# Patient Record
Sex: Male | Born: 1950 | Race: White | Hispanic: No | Marital: Married | Smoking: Former smoker
Health system: Southern US, Community
[De-identification: ages and names within clinical notes are randomized; demographics above are authoritative.]

## PROBLEM LIST (undated history)

## (undated) DIAGNOSIS — D6859 Other primary thrombophilia: Secondary | ICD-10-CM

## (undated) DIAGNOSIS — S129XXA Fracture of neck, unspecified, initial encounter: Secondary | ICD-10-CM

## (undated) DIAGNOSIS — M4802 Spinal stenosis, cervical region: Secondary | ICD-10-CM

## (undated) DIAGNOSIS — I639 Cerebral infarction, unspecified: Secondary | ICD-10-CM

## (undated) DIAGNOSIS — E785 Hyperlipidemia, unspecified: Secondary | ICD-10-CM

## (undated) DIAGNOSIS — D509 Iron deficiency anemia, unspecified: Secondary | ICD-10-CM

## (undated) DIAGNOSIS — I10 Essential (primary) hypertension: Secondary | ICD-10-CM

## (undated) DIAGNOSIS — Z86010 Personal history of colonic polyps: Secondary | ICD-10-CM

## (undated) DIAGNOSIS — G4733 Obstructive sleep apnea (adult) (pediatric): Secondary | ICD-10-CM

## (undated) DIAGNOSIS — K5790 Diverticulosis of intestine, part unspecified, without perforation or abscess without bleeding: Secondary | ICD-10-CM

## (undated) DIAGNOSIS — H9193 Unspecified hearing loss, bilateral: Secondary | ICD-10-CM

## (undated) DIAGNOSIS — C61 Malignant neoplasm of prostate: Secondary | ICD-10-CM

## (undated) HISTORY — PX: TONSILLECTOMY: SUR1361

## (undated) HISTORY — PX: POLYPECTOMY: SHX149

## (undated) HISTORY — PX: COLONOSCOPY: SHX174

## (undated) HISTORY — DX: Hyperlipidemia, unspecified: E78.5

## (undated) HISTORY — PX: NO PAST SURGERIES: SHX2092

## (undated) HISTORY — DX: Obstructive sleep apnea (adult) (pediatric): G47.33

## (undated) HISTORY — DX: Unspecified hearing loss, bilateral: H91.93

## (undated) HISTORY — DX: Essential (primary) hypertension: I10

## (undated) HISTORY — DX: Fracture of neck, unspecified, initial encounter: S12.9XXA

## (undated) HISTORY — DX: Personal history of colonic polyps: Z86.010

---

## 1999-06-07 ENCOUNTER — Encounter: Admission: RE | Admit: 1999-06-07 | Discharge: 1999-09-05 | Payer: Self-pay | Admitting: Cardiology

## 2010-02-12 ENCOUNTER — Encounter (INDEPENDENT_AMBULATORY_CARE_PROVIDER_SITE_OTHER): Payer: Self-pay | Admitting: *Deleted

## 2010-03-19 ENCOUNTER — Encounter (INDEPENDENT_AMBULATORY_CARE_PROVIDER_SITE_OTHER): Payer: Self-pay | Admitting: *Deleted

## 2010-03-25 ENCOUNTER — Ambulatory Visit: Payer: Self-pay | Admitting: Internal Medicine

## 2010-03-25 ENCOUNTER — Encounter (INDEPENDENT_AMBULATORY_CARE_PROVIDER_SITE_OTHER): Payer: Self-pay | Admitting: *Deleted

## 2010-04-03 ENCOUNTER — Ambulatory Visit: Payer: Self-pay | Admitting: Internal Medicine

## 2010-04-03 DIAGNOSIS — Z860100 Personal history of colon polyps, unspecified: Secondary | ICD-10-CM

## 2010-04-03 HISTORY — PX: COLONOSCOPY W/ POLYPECTOMY: SHX1380

## 2010-04-03 HISTORY — DX: Personal history of colon polyps, unspecified: Z86.0100

## 2010-04-03 HISTORY — DX: Personal history of colonic polyps: Z86.010

## 2010-04-11 ENCOUNTER — Encounter: Payer: Self-pay | Admitting: Internal Medicine

## 2010-06-11 NOTE — Letter (Signed)
Summary: [facility] Instructions  [facility]  [address] [place], [place] [address]   Phone: [phone]  Fax: [phone]       Kenneth [name]    1951-05-09    MRN: [id number]        Procedure Day Dorna Bloom:  Wednesday 04/03/2010     Arrival Time:  7:30 am      Procedure Time:  8:30 am     Location of Procedure:                    _x _  [facility] (4th Floor)                        PREPARATION FOR COLONOSCOPY WITH MOVIPREP   Starting 5 days prior to your procedure Friday 11/18 do not eat nuts, seeds, popcorn, corn, beans, peas,  salads, or any raw vegetables.  Do not take any fiber supplements (e.g. Metamucil, Citrucel, and Benefiber).  THE DAY BEFORE YOUR PROCEDURE         DATE: Tuesday 11/22  1.  Drink clear liquids the entire day-NO SOLID FOOD  2.  Do not drink anything colored red or purple.  Avoid juices with pulp.  No orange juice.  3.  Drink at least 64 oz. (8 glasses) of fluid/clear liquids during the day to prevent dehydration and help the prep work efficiently.  CLEAR LIQUIDS INCLUDE: Water Jello Ice Popsicles Tea (sugar ok, no milk/cream) Powdered fruit flavored drinks Coffee (sugar ok, no milk/cream) Gatorade Juice: apple, white grape, white cranberry  Lemonade Clear bullion, consomm, broth Carbonated beverages (any kind) Strained chicken noodle soup Hard Candy                             4.  In the morning, mix first dose of MoviPrep solution:    Empty 1 Pouch A and 1 Pouch B into the disposable container    Add lukewarm drinking water to the top line of the container. Mix to dissolve    Refrigerate (mixed solution should be used within 24 hrs)  5.  Begin drinking the prep at 5:00 p.m. The MoviPrep container is divided by 4 marks.   Every 15 minutes drink the solution down to the next Baylon (approximately 8 oz) until the full liter is complete.   6.  Follow completed prep with 16 oz of clear liquid of your choice  (Nothing red or purple).  Continue to drink clear liquids until bedtime.  7.  Before going to bed, mix second dose of MoviPrep solution:    Empty 1 Pouch A and 1 Pouch B into the disposable container    Add lukewarm drinking water to the top line of the container. Mix to dissolve    Refrigerate  THE DAY OF YOUR PROCEDURE      DATE: Wednesday 11/23  Beginning at 3:30 a.m. (5 hours before procedure):         1. Every 15 minutes, drink the solution down to the next Darran (approx 8 oz) until the full liter is complete.  2. Follow completed prep with 16 oz. of clear liquid of your choice.    3. You may drink clear liquids until 6:30 am (2 HOURS BEFORE PROCEDURE).   MEDICATION INSTRUCTIONS  Unless otherwise instructed, you should take regular prescription medications with a small sip of water   as early as possible the  morning of your procedure.  Diabetic patients - see separate instructions.  Additional medication instructions: Do not take Benazepril/HCTZ day of procedure.         OTHER INSTRUCTIONS  You will need a responsible adult at least 62 years of age to accompany you and drive you home.   This person must remain in the waiting room during your procedure.  Wear loose fitting clothing that is easily removed.  Leave jewelry and other valuables at home.  However, you may wish to bring a book to read or  an iPod/MP3 player to listen to music as you wait for your procedure to start.  Remove all body piercing jewelry and leave at home.  Total time from sign-in until discharge is approximately 2-3 hours.  You should go home directly after your procedure and rest.  You can resume normal activities the  day after your procedure.  The day of your procedure you should not:   Drive   Make legal decisions   Operate machinery   Drink alcohol   Return to work  You will receive specific instructions about eating, activities and medications before you leave.    The  above instructions have been reviewed and explained to me by   _______________________    I fully understand and can verbalize these instructions _____________________________ Date _________

## 2010-06-11 NOTE — Letter (Signed)
Summary: Patient Notice- Polyp Results  [facility]  [address] [place], [place] [address]   Phone: [phone]  Fax: [phone]        April 11, 2010 MRN: [id number]    Kenneth [name] [address] [place], [place]  [address]    Dear [name],  Some of the polyps removed from your colon were adenomatous. This means that they were pre-cancerous or that  they had the potential to change into cancer over time.  In order to make sure that the polyps were completely removed, I recommend you have a repeat colonoscopy in about 3 months. If you are not contacted by [place] at that time, please call us.   In addition to repeating colonoscopy, changing health habits may reduce your risk of having more colon or rectal  polyps and possibly, colorectal cancer. You may lower your risk of future polyps and colorectal cancer by adopting healthy habits such as not smoking or using tobacco (if you do), being physically active, losing weight (if overweight), and eating a diet which includes fruits and vegetables and limits red meat.  Please call us if you are having persistent problems or have questions about your condition that have not been fully answered at this time.  Sincerely,  Iva Boop MD, [facility]  This letter has been electronically signed by your physician.  Appended Document: Patient Notice- Polyp Results Letter mailed

## 2010-06-11 NOTE — Letter (Signed)
Summary: Pre Visit Letter Revised  [facility]  [address] [place], [place] [address]   Phone: [phone]  Fax: [phone]        02/12/2010 MRN: [id number] [place] [address] [place], [place]  [address]             Procedure Date:  04/03/2010   Welcome to the Gastroenterology Division at [facility].    You are scheduled to see a nurse for your pre-procedure visit on 03/25/2010 at 4:00pm on the 3rd floor at [facility], [address]  We ask that you try to arrive at our office 15 minutes prior to your appointment time to allow for check-in.  Please take a minute to review the attached form.  If you answer "Yes" to one or more of the questions on the first page, we ask that you call the person listed at your earliest opportunity.  If you answer "No" to all of the questions, please complete the rest of the form and bring it to your appointment.    Your nurse visit will consist of discussing your medical and surgical history, your immediate family medical history, and your medications.   If you are unable to list all of your medications on the form, please bring the medication bottles to your appointment and we will list them.  We will need to be aware of both prescribed and over the counter drugs.  We will need to know exact dosage information as well.    Please be prepared to read and sign documents such as consent forms, a financial agreement, and acknowledgement forms.  If necessary, and with your consent, a friend or relative is welcome to sit-in on the nurse visit with you.  Please bring your insurance card so that we may make a copy of it.  If your insurance requires a referral to see a specialist, please bring your referral form from your primary care physician.  No co-pay is required for this nurse visit.     If you cannot keep your appointment, please call [phone] to cancel or reschedule prior to your appointment date.  This allows  [place] the opportunity to schedule an appointment for another patient in need of care.    Thank you for choosing [facility] for your medical needs.  We appreciate the opportunity to care for you.  Please visit [place] at our website  to learn more about our practice.  Sincerely, The Gastroenterology Division

## 2010-06-11 NOTE — Procedures (Signed)
Summary: Colonoscopy  Patient: [name] Note: All result statuses are Final unless otherwise noted.  Tests: (1) Colonoscopy (COL)   COL Colonoscopy           DONE     [facility]     [address]     [place], [place]  [address]           COLONOSCOPY PROCEDURE REPORT           PATIENT:  Kenneth, [name]  MR#:  [id number]     BIRTHDATE:  04/07/1951, 59 yrs. old  GENDER:  male     ENDOSCOPIST:  Iva Boop, MD, [facility]     REF. BY:  [name], M.D.     PROCEDURE DATE:  04/03/2010     PROCEDURE:  Colonoscopy with snare polypectomy     ASA CLASS:  Class II     INDICATIONS:  Routine Risk Screening     MEDICATIONS:   Fentanyl 50 mcg IV, Versed 5 mg           DESCRIPTION OF PROCEDURE:   After the risks benefits and     alternatives of the procedure were thoroughly explained, informed     consent was obtained.  Digital rectal exam was performed and     revealed no abnormalities.   The LB 180AL [id number] endoscope was     introduced through the anus and advanced to the cecum, which was     identified by both the appendix and ileocecal valve, without     limitations.  The quality of the prep was excellent, using     MoviPrep.  The instrument was then slowly withdrawn as the colon     was fully examined. Insertion: 1:22 minutes Withdrawal: 13:37     minutes     <<PROCEDUREIMAGES>>           FINDINGS:  Four polyps were found. 1) 8 mm sessile ascending polyp     2) 18-20 mm sessile ascending colon polyp piecemeal removed 3) 10     mm sessile sigmoid polyp 4) 25 mm pedunculated polyp. Polyps were     snared, then cauterized with monopolar cautery. Retrieval was     successful.  This was otherwise a normal examination of the colon.     Includes right colon retroflexion.   Retroflexed views in the     rectum revealed no abnormalities.    The scope was then withdrawn     from the patient and the procedure completed.           COMPLICATIONS:  None     ENDOSCOPIC  IMPRESSION:     1) Four polyps removed with snare cautery. 66mm-25mm sizes.     2) Otherwise normal examination, excellent prep.     RECOMMENDATIONS:     1) Hold aspirin, aspirin products, and anti-inflammatory     medication for 2 weeks.           REPEAT EXAM:  In for Colonoscopy, pending biopsy results. Probably     needs closer follow-up of larger ascending polyp to ensure     complete removal.           Iva Boop, MD, [facility]           CC:  [name], MD     The Patient           n.     eSIGNED:   Iva Boop at 04/03/2010 09:24  AM           Cherylann Ratel, [id number]  Note: An exclamation Eliyah (!) indicates a result that was not dispersed into the flowsheet. Document Creation Date: 04/03/2010 9:24 AM _______________________________________________________________________  (1) Order result status: Final Collection or observation date-time: 04/03/2010 09:12 Requested date-time:  Receipt date-time:  Reported date-time:  Referring Physician:   Ordering Physician: Stan Head [phone]) Specimen Source:  Source: Launa Grill Order Number: [id number] Lab site:   Appended Document: Colonoscopy   Colonoscopy  Procedure date:  04/03/2010  Findings:          1) Four polyps removed with snare cautery. 21mm-25mm sizes.     2) Otherwise normal examination, excellent prep.   1. 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   2. Colon, polyp(s), sigmoid :  HYPERPLASTIC POLYP.NO ADENOMATOUS CHANGE OR MALIGNANCY. 3. Colon, polyp(s), rectosigmoid :  TUBULOVILLOUS ADENOMA.STALK MARGIN NOT INVOLVED BY ADENOMA.NO HIGH GRADE DYSPLASIA OR MALIGNANCY.    Comments:      Repeat colonoscopy in 1-3 months.    Procedures Next Due Date:    Colonoscopy: 07/2010   Appended Document: Colonoscopy     Procedures Next Due Date:    Colonoscopy: 07/2010

## 2010-06-11 NOTE — Miscellaneous (Signed)
Summary: [facility]  Clinical Lists Changes  Medications: Added new medication of MOVIPREP 100 GM  SOLR (PEG-KCL-NACL-NASULF-NA ASC-C) As per prep instructions. - Signed Rx of MOVIPREP 100 GM  SOLR (PEG-KCL-NACL-NASULF-NA ASC-C) As per prep instructions.;  #1 x 0;  Signed;  Entered by: Ezra Sites RN;  Authorized by: Iva Boop MD, [facility];  Method used: Electronically to [facility]*, [address] [place], [place]  [id number], Ph: [phone], Fax: [phone] Observations: Added new observation of NKA: T (03/25/2010 15:55)    Prescriptions: MOVIPREP 100 GM  SOLR (PEG-KCL-NACL-NASULF-NA ASC-C) As per prep instructions.  #1 x 0   Entered by:   Ezra Sites RN   Authorized by:   Iva Boop MD, [facility]   Signed by:   Ezra Sites RN on 03/25/2010   Method used:   Electronically to        [facility]* (retail)       [address]       [place], [place]  [id number]       Ph: [phone]       Fax: [phone]   RxID:   [id number]

## 2010-06-11 NOTE — Letter (Signed)
Summary: Diabetic Instructions  [facility]  [address] [place], [place] [address]   Phone: [phone]  Fax: [phone]    Kenneth [name] July 17, 1950 MRN: [id number]   Metformin--   ORAL DIABETIC MEDICATION INSTRUCTIONS  The day before your procedure:   Take your diabetic pill as you do normally  The day of your procedure:   Do not take your diabetic pill    We will check your blood sugar levels during the admission process and again in Recovery before discharging you home  ________________________________________________________________________  Lantus Solostar   INSULIN (LONG ACTING) MEDICATION INSTRUCTIONS (Lantus, NPH, 70/30, Humulin, Novolin-N)   The day before your procedure:   Take  your regular evening dose    The day of your procedure:   Do not take your morning dose    Apidra INSULIN (SHORT ACTING) MEDICATION INSTRUCTIONS (Regular, Humulog, Novolog)   The day before your procedure:   Do not take your evening dose   The day of your procedure:   Do not take your morning dose

## 2010-06-20 ENCOUNTER — Encounter: Payer: Self-pay | Admitting: Internal Medicine

## 2010-06-29 ENCOUNTER — Telehealth: Payer: Self-pay | Admitting: Internal Medicine

## 2010-07-09 NOTE — Progress Notes (Signed)
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  I will call him in late March to set up for hospital week in April or May. Follow-up by: Darcey Nora RN, CGRN,  July 01, 2010 10:22 AM  Additional Follow-up for Phone Call Additional follow up Details #1::        ok  Additional Follow-up by: Iva Boop MD, [facility],  July 02, 2010 8:21 AM

## 2010-07-09 NOTE — Procedures (Signed)
Summary: Recall Assessment/[facility]  Recall Assessment/[facility]   Imported By: Sherian Rein 07/03/2010 14:04:02  _____________________________________________________________________  External Attachment:    Type:   Image     Comment:   External Document

## 2010-07-23 LAB — GLUCOSE, CAPILLARY
Glucose-Capillary: 174 mg/dL — ABNORMAL HIGH (ref 70–99)
Glucose-Capillary: 208 mg/dL — ABNORMAL HIGH (ref 70–99)

## 2010-08-22 ENCOUNTER — Telehealth: Payer: Self-pay

## 2010-08-22 DIAGNOSIS — K635 Polyp of colon: Secondary | ICD-10-CM

## 2010-08-22 NOTE — Telephone Encounter (Signed)
Message copied by Darcey Nora on Thu Aug 22, 2010 10:52 AM ------      Message from: Darcey Nora      Created: Mon Jul 15, 2010  3:43 PM       Pt needs colon with apc Dr Marvell Fuller hospital week after 4/15

## 2010-08-22 NOTE — Telephone Encounter (Signed)
Patient has been scheduled for pre-visit for 09/23/10 8:00 and colon with APC @ [facility] for 09/30/10 with Dr Leone Payor.

## 2010-09-23 ENCOUNTER — Ambulatory Visit (AMBULATORY_SURGERY_CENTER): Payer: BC Managed Care – PPO | Admitting: *Deleted

## 2010-09-23 VITALS — Ht 71.0 in | Wt 256.3 lb

## 2010-09-23 DIAGNOSIS — Z86010 Personal history of colonic polyps: Secondary | ICD-10-CM

## 2010-09-23 MED ORDER — PEG-KCL-NACL-NASULF-NA ASC-C 100 G PO SOLR: ORAL | Status: DC

## 2010-09-30 ENCOUNTER — Encounter: Payer: BC Managed Care – PPO | Admitting: Internal Medicine

## 2010-09-30 ENCOUNTER — Ambulatory Visit (HOSPITAL_COMMUNITY)
Admission: RE | Admit: 2010-09-30 | Discharge: 2010-09-30 | Disposition: A | Discharge status: Home / Self Care | Payer: BC Managed Care – PPO | Source: Ambulatory Visit | Attending: Internal Medicine | Admitting: Internal Medicine

## 2010-09-30 ENCOUNTER — Encounter: Payer: Self-pay | Admitting: Internal Medicine

## 2010-09-30 DIAGNOSIS — Z860100 Personal history of colon polyps, unspecified: Secondary | ICD-10-CM | POA: Insufficient documentation

## 2010-09-30 DIAGNOSIS — K648 Other hemorrhoids: Secondary | ICD-10-CM | POA: Insufficient documentation

## 2010-09-30 DIAGNOSIS — K649 Unspecified hemorrhoids: Secondary | ICD-10-CM

## 2010-09-30 DIAGNOSIS — K5730 Diverticulosis of large intestine without perforation or abscess without bleeding: Secondary | ICD-10-CM | POA: Insufficient documentation

## 2010-09-30 DIAGNOSIS — Z09 Encounter for follow-up examination after completed treatment for conditions other than malignant neoplasm: Secondary | ICD-10-CM | POA: Insufficient documentation

## 2010-09-30 DIAGNOSIS — E119 Type 2 diabetes mellitus without complications: Secondary | ICD-10-CM | POA: Insufficient documentation

## 2010-09-30 HISTORY — PX: COLONOSCOPY: SHX174

## 2010-09-30 LAB — GLUCOSE, CAPILLARY: Glucose-Capillary: 201 mg/dL — ABNORMAL HIGH (ref 70–99)

## 2010-10-18 ENCOUNTER — Other Ambulatory Visit: Payer: Self-pay | Admitting: Internal Medicine

## 2010-10-18 ENCOUNTER — Ambulatory Visit
Admission: RE | Admit: 2010-10-18 | Discharge: 2010-10-18 | Disposition: A | Discharge status: Home / Self Care | Payer: BC Managed Care – PPO | Source: Ambulatory Visit | Attending: Internal Medicine | Admitting: Internal Medicine

## 2010-10-18 DIAGNOSIS — R05 Cough: Secondary | ICD-10-CM

## 2012-09-27 ENCOUNTER — Emergency Department (HOSPITAL_COMMUNITY): Payer: BC Managed Care – PPO

## 2012-09-27 ENCOUNTER — Emergency Department (HOSPITAL_COMMUNITY)
Admission: EM | Admit: 2012-09-27 | Discharge: 2012-09-27 | Disposition: A | Discharge status: Home / Self Care | Payer: BC Managed Care – PPO | Attending: Emergency Medicine | Admitting: Emergency Medicine

## 2012-09-27 ENCOUNTER — Encounter (HOSPITAL_COMMUNITY): Payer: Self-pay | Admitting: *Deleted

## 2012-09-27 ENCOUNTER — Other Ambulatory Visit (HOSPITAL_COMMUNITY): Payer: BC Managed Care – PPO

## 2012-09-27 DIAGNOSIS — Z87891 Personal history of nicotine dependence: Secondary | ICD-10-CM | POA: Insufficient documentation

## 2012-09-27 DIAGNOSIS — Z7982 Long term (current) use of aspirin: Secondary | ICD-10-CM | POA: Insufficient documentation

## 2012-09-27 DIAGNOSIS — Z79899 Other long term (current) drug therapy: Secondary | ICD-10-CM | POA: Insufficient documentation

## 2012-09-27 DIAGNOSIS — I10 Essential (primary) hypertension: Secondary | ICD-10-CM | POA: Insufficient documentation

## 2012-09-27 DIAGNOSIS — Z794 Long term (current) use of insulin: Secondary | ICD-10-CM | POA: Insufficient documentation

## 2012-09-27 DIAGNOSIS — E785 Hyperlipidemia, unspecified: Secondary | ICD-10-CM | POA: Insufficient documentation

## 2012-09-27 DIAGNOSIS — M5412 Radiculopathy, cervical region: Secondary | ICD-10-CM

## 2012-09-27 DIAGNOSIS — E119 Type 2 diabetes mellitus without complications: Secondary | ICD-10-CM | POA: Insufficient documentation

## 2012-09-27 MED ORDER — HYDROMORPHONE HCL PF 2 MG/ML IJ SOLN
2.0000 mg | Freq: Once | INTRAMUSCULAR | Status: DC
  Filled 2012-09-27: qty 1

## 2012-09-27 MED ORDER — FENTANYL CITRATE 0.05 MG/ML IJ SOLN
100.0000 ug | Freq: Once | INTRAMUSCULAR | Status: AC
  Administered 2012-09-27: 100 ug via INTRAVENOUS
  Filled 2012-09-27: qty 2

## 2012-09-27 MED ORDER — MORPHINE SULFATE 4 MG/ML IJ SOLN
8.0000 mg | Freq: Once | INTRAMUSCULAR | Status: AC
  Administered 2012-09-27: 8 mg via INTRAVENOUS
  Filled 2012-09-27: qty 2

## 2012-09-27 MED ORDER — OXYCODONE-ACETAMINOPHEN 5-325 MG PO TABS: 2.0000 | ORAL_TABLET | ORAL | Status: DC | PRN

## 2012-09-27 NOTE — ED Notes (Signed)
Patient brought back from MRI, unable to complete scan d/t pain.

## 2012-09-27 NOTE — ED Notes (Signed)
Pt reports r arm/shoulder pain and finger numbness. Pt sts recently came back from trip to [place] and was to have MRI done but not done  d/t pain. Pt sts PCP advised him to come to ED to be sedated for MRI and for pain management.

## 2012-09-27 NOTE — ED Notes (Signed)
Pt sent over by PCP for pain control and MRI, pt c/o rt arm/shoulder pain x2wks

## 2012-09-27 NOTE — ED Provider Notes (Signed)
History     CSN: [id number]  Arrival date & time 09/27/12  1453   First MD Initiated Contact with Patient 09/27/12 1542      No chief complaint on file. shoulder pain  (Consider location/radiation/quality/duration/timing/severity/associated sxs/prior treatment) HPI This 62 year old male has gradual onset constant atraumatic right scapular posterior pain radiating down his right arm to his elbow with associated with numbness to his right index and long fingers and weakness to his right triceps gradual onset for the last 2 weeks worse with head position changes such as looking up, his symptoms are nonexertional, he is no chest pain cough shortness breath abdominal pain vomiting and no involvement of his legs to his left arm he is no change in bowel or bladder function, he has been seen twice at hospitals in [place] for this since he was out of the country on vacation when this started and once received a steroid injection in the scapula area posteriorly and received another steroid injection in his gluteal region as well as saw his primary care doctor upon return to the [place] who ordered an MRI today the patient was not able to tolerate the MRI attempt due to pain and was sent to the emergency department by his primary care Dr. to get an MRI performed of this cervical spine. The patient does not actually have pain to the shoulder joint is no pain to his elbow wrist or hand the pain radiates from his right posterior scapular area to his right elbow. His pain is severe and worse with movements of his head especially looking up and has difficulty sitting and feels better if he is up and walking around. Patient has not had improvement with steroid injections and oral steroids. Past Medical History  Diagnosis Date  . Diabetes mellitus   . Hyperlipidemia   . Hypertension     Past Surgical History  Procedure Laterality Date  . Colonoscopy w/ polypectomy  04/03/2010    4 polyps 8-10mm, worst  TV adenoma with high-grade dysplasia  . Colonoscopy  09/30/2010    diverticulosis, small internal hemorrhoids  . No past surgeries      History reviewed. No pertinent family history.  History  Substance Use Topics  . Smoking status: Former Smoker -- 2 years    Quit date: 09/22/1969  . Smokeless tobacco: Not on file  . Alcohol Use: No      Review of Systems 10 Systems reviewed and are negative for acute change except as noted in the HPI. Allergies  Penicillins  Home Medications   Current Outpatient Rx  Name  Route  Sig  Dispense  Refill  . aspirin 81 MG tablet   Oral   Take 81 mg by mouth daily.           . benazepril-hydrochlorthiazide (LOTENSIN HCT) 20-25 MG per tablet   Oral   Take 1 tablet by mouth daily.           . calcium carbonate (OS-CAL) 600 MG TABS   Oral   Take 600 mg by mouth 2 (two) times daily with a meal.           . felodipine (PLENDIL) 5 MG 24 hr tablet   Oral   Take 5 mg by mouth daily.           . Insulin Glargine (LANTUS DeWitt)   Subcutaneous   Inject 40 Units into the skin at bedtime.          . insulin  glulisine (APIDRA) 100 UNIT/ML injection   Subcutaneous   Inject 10 Units into the skin 3 (three) times daily before meals.           . metFORMIN (GLUCOPHAGE) 1000 MG tablet   Oral   Take 1,000 mg by mouth 2 (two) times daily with a meal.           . methocarbamol (ROBAXIN) 500 MG tablet   Oral   Take 500 mg by mouth 3 (three) times daily.         . rosuvastatin (CRESTOR) 10 MG tablet   Oral   Take 10 mg by mouth daily.           Marland Kitchen oxyCODONE-acetaminophen (PERCOCET/ROXICET) 5-325 MG per tablet   Oral   Take 2 tablets by mouth every 4 (four) hours as needed for pain.           BP 122/67  Pulse 45  Temp(Src) 97.4 F (36.3 C) (Oral)  Resp 20  SpO2 97%  Physical Exam  Nursing note and vitals reviewed. Constitutional:  Awake, alert, nontoxic appearance.  HENT:  Head: Atraumatic.  Eyes: Right eye exhibits  no discharge. Left eye exhibits no discharge.  Neck: Neck supple.  Cardiovascular: Normal rate and regular rhythm.   No murmur heard. Pulmonary/Chest: Effort normal and breath sounds normal. No respiratory distress. He has no wheezes. He has no rales. He exhibits no tenderness.  Abdominal: Soft. Bowel sounds are normal. He exhibits no distension. There is no tenderness. There is no rebound and no guarding.  Musculoskeletal: He exhibits tenderness.  Left arm and both legs are nontender. Cervical spine is nontender. Right posterior scapular area is tender. Right shoulder elbow wrist and hand are all nontender. Capillary refill less than 2 seconds right arm. Normal light touch image durations of the median radial and ulnar nerve function of the right hand with the exception of decreased light touch to the index and long fingers only of the right hand with intact motor strength in the distributions of the above-noted nerve functions in the right hand the patient has moderate weakness to the right triceps with good strength to the right biceps good strength to the right deltoid abduction as well as internal and external rotation of the shoulder with negative drop test of the right shoulder.  Neurological: He is alert.  Mental status and motor strength appears baseline for patient and situation.  Skin: No rash noted.  Psychiatric: He has a normal mood and affect.    ED Course  Procedures (including critical care time)  Patient / Family / Caregiver understand and agree with initial ED impression and plan with expectations set for ED visit. Pain which improved patient was able to sleep comfortably in the emergency department however still could not tolerate lying flat for MRI attempts. 1830  Pt stable in ED with no significant deterioration in condition.Patient / Family / Caregiver informed of clinical course, understand medical decision-making process, and agree with plan.  Labs Reviewed - No data to  display Dg Cervical Spine 2-3 Views  10/01/2012   *RADIOLOGY REPORT*  Clinical Data: Cervical fusion  CERVICAL SPINE - 2-3 VIEW  Comparison: 09/28/2012  Findings: Anterior plate and screws are present at C5, C6, and C7 with interposed tubular bone plugs.  There is anatomic alignment through the top of C7.  No breakage or loosening of the hardware.  IMPRESSION: Anterior discectomy and fusion at C5, C6, and C7 without complication.   Original Report Authenticated  By: Jolaine Click, M.D.   Dg C-arm 1-60 Min  10/01/2012   Please refer to accession number [id number].   Original Report Authenticated By: Jolaine Click, M.D.     1. Cervical radiculopathy       MDM  [facility], CVA.        Hurman Horn, MD 10/03/12 1440

## 2012-09-28 ENCOUNTER — Other Ambulatory Visit: Payer: Self-pay | Admitting: Neurological Surgery

## 2012-09-28 ENCOUNTER — Ambulatory Visit
Admission: RE | Admit: 2012-09-28 | Discharge: 2012-09-28 | Disposition: A | Discharge status: Home / Self Care | Payer: BC Managed Care – PPO | Source: Ambulatory Visit | Attending: Neurological Surgery | Admitting: Neurological Surgery

## 2012-09-28 DIAGNOSIS — M5412 Radiculopathy, cervical region: Secondary | ICD-10-CM

## 2012-09-29 ENCOUNTER — Encounter (HOSPITAL_COMMUNITY): Payer: Self-pay | Admitting: Pharmacy Technician

## 2012-09-30 ENCOUNTER — Encounter (HOSPITAL_COMMUNITY): Payer: Self-pay

## 2012-09-30 ENCOUNTER — Encounter (HOSPITAL_COMMUNITY)
Admission: RE | Admit: 2012-09-30 | Discharge: 2012-09-30 | Disposition: A | Discharge status: Home / Self Care | Payer: BC Managed Care – PPO | Source: Ambulatory Visit | Attending: Anesthesiology | Admitting: Anesthesiology

## 2012-09-30 ENCOUNTER — Encounter (HOSPITAL_COMMUNITY)
Admission: RE | Admit: 2012-09-30 | Discharge: 2012-09-30 | Disposition: A | Discharge status: Home / Self Care | Payer: BC Managed Care – PPO | Source: Ambulatory Visit | Attending: Neurological Surgery | Admitting: Neurological Surgery

## 2012-09-30 LAB — BASIC METABOLIC PANEL
BUN: 24 mg/dL — ABNORMAL HIGH (ref 6–23)
Calcium: 9.9 mg/dL (ref 8.4–10.5)
Chloride: 96 mEq/L (ref 96–112)
Creatinine, Ser: 0.84 mg/dL (ref 0.50–1.35)
GFR calc Af Amer: >90 mL/min (ref >90)
GFR calc non Af Amer: >90 mL/min (ref >90)

## 2012-09-30 LAB — SURGICAL PCR SCREEN
MRSA, PCR: NEGATIVE
Staphylococcus aureus: NEGATIVE

## 2012-09-30 LAB — CBC
MCHC: 34.2 g/dL (ref 30.0–36.0)
Platelets: 297 10*3/uL (ref 150–400)
RDW: 12.8 % (ref 11.5–15.5)
WBC: 16.7 10*3/uL — ABNORMAL HIGH (ref 4.0–10.5)

## 2012-09-30 NOTE — Pre-Procedure Instructions (Signed)
Kenneth [name]  09/30/2012   Your procedure is scheduled on: Friday, May 23rd.  Report to [facility] at 1200noon .  Call this number if you have problems the morning of surgery: [phone]   Remember:   Do not eat food or drink liquids after midnight.   Take these medicines the morning of surgery with A SIP OF WATER: methocarbamol (ROBAXIN).  May take if needed:oxyCODONE-acetaminophen (PERCOCET/ROXICET)    Do not wear jewelry, make-up or nail polish.  Do not wear lotions, powders, or perfumes. You may wear deodorant.  Do not shave 48 hours prior to surgery. Men may shave face and neck.  Do not bring valuables to the hospital.  Contacts, dentures or bridgework may not be worn into surgery.  Leave suitcase in the car. After surgery it may be brought to your room.  For patients admitted to the hospital, checkout time is 11:00 AM the day of discharge.   Patients discharged the day of surgery will not be allowed to drive home.  Name and phone number of your driver: -  Special Instructions: Shower with CHG wash (Bactoshield) tonight and again in the am prior to arriving to hospital.    Please read over the following fact sheets that you were given: Pain Booklet, Coughing and Deep Breathing and Surgical Site Infection Prevention

## 2012-09-30 NOTE — Progress Notes (Signed)
SLEEP STUDY REQUESTED FROM [facility].

## 2012-10-01 ENCOUNTER — Ambulatory Visit (HOSPITAL_COMMUNITY)
Admission: RE | Admit: 2012-10-01 | Discharge: 2012-10-02 | Disposition: A | Discharge status: Home / Self Care | Payer: BC Managed Care – PPO | Source: Ambulatory Visit | Attending: Neurological Surgery | Admitting: Neurological Surgery

## 2012-10-01 ENCOUNTER — Encounter (HOSPITAL_COMMUNITY): Payer: Self-pay | Admitting: Anesthesiology

## 2012-10-01 ENCOUNTER — Ambulatory Visit (HOSPITAL_COMMUNITY): Payer: BC Managed Care – PPO | Admitting: Anesthesiology

## 2012-10-01 ENCOUNTER — Encounter (HOSPITAL_COMMUNITY)
Admission: RE | Disposition: A | Discharge status: Home / Self Care | Payer: Self-pay | Source: Ambulatory Visit | Attending: Neurological Surgery

## 2012-10-01 ENCOUNTER — Ambulatory Visit (HOSPITAL_COMMUNITY): Payer: BC Managed Care – PPO

## 2012-10-01 DIAGNOSIS — Z01818 Encounter for other preprocedural examination: Secondary | ICD-10-CM | POA: Insufficient documentation

## 2012-10-01 DIAGNOSIS — I10 Essential (primary) hypertension: Secondary | ICD-10-CM | POA: Insufficient documentation

## 2012-10-01 DIAGNOSIS — E119 Type 2 diabetes mellitus without complications: Secondary | ICD-10-CM | POA: Insufficient documentation

## 2012-10-01 DIAGNOSIS — M47812 Spondylosis without myelopathy or radiculopathy, cervical region: Secondary | ICD-10-CM | POA: Insufficient documentation

## 2012-10-01 DIAGNOSIS — Z79899 Other long term (current) drug therapy: Secondary | ICD-10-CM | POA: Insufficient documentation

## 2012-10-01 DIAGNOSIS — M5020 Other cervical disc displacement, unspecified cervical region: Secondary | ICD-10-CM | POA: Insufficient documentation

## 2012-10-01 DIAGNOSIS — E785 Hyperlipidemia, unspecified: Secondary | ICD-10-CM | POA: Insufficient documentation

## 2012-10-01 DIAGNOSIS — Z981 Arthrodesis status: Secondary | ICD-10-CM

## 2012-10-01 DIAGNOSIS — Z01812 Encounter for preprocedural laboratory examination: Secondary | ICD-10-CM | POA: Insufficient documentation

## 2012-10-01 HISTORY — PX: ANTERIOR CERVICAL DECOMP/DISCECTOMY FUSION: SHX1161

## 2012-10-01 LAB — GLUCOSE, CAPILLARY
Glucose-Capillary: 109 mg/dL — ABNORMAL HIGH (ref 70–99)
Glucose-Capillary: 163 mg/dL — ABNORMAL HIGH (ref 70–99)
Glucose-Capillary: 261 mg/dL — ABNORMAL HIGH (ref 70–99)

## 2012-10-01 SURGERY — ANTERIOR CERVICAL DECOMPRESSION/DISCECTOMY FUSION 2 LEVELS
Anesthesia: General | Site: Spine Cervical | Wound class: Clean

## 2012-10-01 MED ORDER — SODIUM CHLORIDE 0.9 % IV SOLN
INTRAVENOUS | Status: AC
  Filled 2012-10-01: qty 500

## 2012-10-01 MED ORDER — THROMBIN 5000 UNITS EX SOLR
OROMUCOSAL | Status: DC | PRN
  Administered 2012-10-01: 16:00:00 via TOPICAL

## 2012-10-01 MED ORDER — LIDOCAINE HCL 4 % MT SOLN
OROMUCOSAL | Status: DC | PRN
  Administered 2012-10-01: 4 mL via TOPICAL

## 2012-10-01 MED ORDER — THROMBIN 5000 UNITS EX SOLR
CUTANEOUS | Status: DC | PRN
  Administered 2012-10-01 (×2): 5000 [IU] via TOPICAL

## 2012-10-01 MED ORDER — INSULIN GLARGINE 100 UNIT/ML ~~LOC~~ SOLN
40.0000 [IU] | Freq: Every day | SUBCUTANEOUS | Status: DC
  Administered 2012-10-01: 40 [IU] via SUBCUTANEOUS
  Filled 2012-10-01 (×2): qty 0.4

## 2012-10-01 MED ORDER — OXYCODONE HCL 5 MG PO TABS
ORAL_TABLET | ORAL | Status: AC
  Filled 2012-10-01: qty 1

## 2012-10-01 MED ORDER — PHENOL 1.4 % MT LIQD: 1.0000 | OROMUCOSAL | Status: DC | PRN

## 2012-10-01 MED ORDER — METFORMIN HCL 500 MG PO TABS: 1000.0000 mg | ORAL_TABLET | Freq: Two times a day (BID) | ORAL | Status: DC

## 2012-10-01 MED ORDER — ROCURONIUM BROMIDE 100 MG/10ML IV SOLN
INTRAVENOUS | Status: DC | PRN
  Administered 2012-10-01: 50 mg via INTRAVENOUS
  Administered 2012-10-01 (×2): 10 mg via INTRAVENOUS

## 2012-10-01 MED ORDER — POTASSIUM CHLORIDE IN NACL 20-0.9 MEQ/L-% IV SOLN
INTRAVENOUS | Status: DC
  Administered 2012-10-01: 19:00:00 via INTRAVENOUS
  Filled 2012-10-01 (×4): qty 1000

## 2012-10-01 MED ORDER — INSULIN GLULISINE 100 UNIT/ML ~~LOC~~ SOLN: 10.0000 [IU] | Freq: Three times a day (TID) | SUBCUTANEOUS | Status: DC

## 2012-10-01 MED ORDER — MENTHOL 3 MG MT LOZG: 1.0000 | LOZENGE | OROMUCOSAL | Status: DC | PRN

## 2012-10-01 MED ORDER — BENAZEPRIL-HYDROCHLOROTHIAZIDE 20-25 MG PO TABS: 1.0000 | ORAL_TABLET | Freq: Every day | ORAL | Status: DC

## 2012-10-01 MED ORDER — HYDROMORPHONE HCL PF 1 MG/ML IJ SOLN
0.2500 mg | INTRAMUSCULAR | Status: DC | PRN
  Administered 2012-10-01 (×2): 0.5 mg via INTRAVENOUS

## 2012-10-01 MED ORDER — ONDANSETRON HCL 4 MG/2ML IJ SOLN: 4.0000 mg | Freq: Four times a day (QID) | INTRAMUSCULAR | Status: DC | PRN

## 2012-10-01 MED ORDER — OXYCODONE HCL 5 MG/5ML PO SOLN: 5.0000 mg | Freq: Once | ORAL | Status: AC | PRN

## 2012-10-01 MED ORDER — MORPHINE SULFATE 2 MG/ML IJ SOLN
1.0000 mg | INTRAMUSCULAR | Status: DC | PRN
  Administered 2012-10-02: 2 mg via INTRAVENOUS
  Filled 2012-10-01: qty 1

## 2012-10-01 MED ORDER — OXYCODONE HCL 5 MG PO TABS
10.0000 mg | ORAL_TABLET | ORAL | Status: DC | PRN
  Administered 2012-10-01 – 2012-10-02 (×2): 10 mg via ORAL
  Filled 2012-10-01 (×2): qty 2

## 2012-10-01 MED ORDER — LIDOCAINE HCL (CARDIAC) 20 MG/ML IV SOLN
INTRAVENOUS | Status: DC | PRN
  Administered 2012-10-01: 70 mg via INTRAVENOUS

## 2012-10-01 MED ORDER — VANCOMYCIN HCL IN DEXTROSE 1-5 GM/200ML-% IV SOLN
INTRAVENOUS | Status: AC
  Filled 2012-10-01: qty 200

## 2012-10-01 MED ORDER — ACETAMINOPHEN 10 MG/ML IV SOLN
INTRAVENOUS | Status: AC
  Filled 2012-10-01: qty 100

## 2012-10-01 MED ORDER — FELODIPINE ER 5 MG PO TB24
5.0000 mg | ORAL_TABLET | Freq: Every day | ORAL | Status: DC
  Administered 2012-10-02: 5 mg via ORAL
  Filled 2012-10-01: qty 1

## 2012-10-01 MED ORDER — ZOLPIDEM TARTRATE 5 MG PO TABS: 5.0000 mg | ORAL_TABLET | Freq: Every evening | ORAL | Status: DC | PRN

## 2012-10-01 MED ORDER — SODIUM CHLORIDE 0.9 % IV SOLN: 250.0000 mL | INTRAVENOUS | Status: DC

## 2012-10-01 MED ORDER — PROPOFOL 10 MG/ML IV BOLUS
INTRAVENOUS | Status: DC | PRN
  Administered 2012-10-01: 160 mg via INTRAVENOUS

## 2012-10-01 MED ORDER — GLYCOPYRROLATE 0.2 MG/ML IJ SOLN
INTRAMUSCULAR | Status: DC | PRN
  Administered 2012-10-01: 0.6 mg via INTRAVENOUS

## 2012-10-01 MED ORDER — ARTIFICIAL TEARS OP OINT
TOPICAL_OINTMENT | OPHTHALMIC | Status: DC | PRN
  Administered 2012-10-01: 1 via OPHTHALMIC

## 2012-10-01 MED ORDER — VANCOMYCIN HCL 1000 MG IV SOLR
1000.0000 mg | Freq: Two times a day (BID) | INTRAVENOUS | Status: DC
  Administered 2012-10-01: 1000 mg via INTRAVENOUS
  Filled 2012-10-01 (×2): qty 1000

## 2012-10-01 MED ORDER — ONDANSETRON HCL 4 MG/2ML IJ SOLN: 4.0000 mg | INTRAMUSCULAR | Status: DC | PRN

## 2012-10-01 MED ORDER — BENAZEPRIL HCL 20 MG PO TABS
20.0000 mg | ORAL_TABLET | Freq: Every day | ORAL | Status: DC
  Administered 2012-10-02: 20 mg via ORAL
  Filled 2012-10-01: qty 1

## 2012-10-01 MED ORDER — SODIUM CHLORIDE 0.9 % IR SOLN
Status: DC | PRN
  Administered 2012-10-01: 15:00:00

## 2012-10-01 MED ORDER — SODIUM CHLORIDE 0.9 % IJ SOLN: 3.0000 mL | INTRAMUSCULAR | Status: DC | PRN

## 2012-10-01 MED ORDER — HYDROCHLOROTHIAZIDE 25 MG PO TABS
25.0000 mg | ORAL_TABLET | Freq: Every day | ORAL | Status: DC
  Administered 2012-10-02: 25 mg via ORAL
  Filled 2012-10-01: qty 1

## 2012-10-01 MED ORDER — FENTANYL CITRATE 0.05 MG/ML IJ SOLN
INTRAMUSCULAR | Status: DC | PRN
  Administered 2012-10-01: 100 ug via INTRAVENOUS
  Administered 2012-10-01 (×4): 50 ug via INTRAVENOUS

## 2012-10-01 MED ORDER — OXYCODONE HCL 5 MG PO TABS
5.0000 mg | ORAL_TABLET | Freq: Once | ORAL | Status: AC | PRN
  Administered 2012-10-01: 5 mg via ORAL

## 2012-10-01 MED ORDER — EPHEDRINE SULFATE 50 MG/ML IJ SOLN
INTRAMUSCULAR | Status: DC | PRN
  Administered 2012-10-01: 5 mg via INTRAVENOUS
  Administered 2012-10-01 (×2): 10 mg via INTRAVENOUS

## 2012-10-01 MED ORDER — BACITRACIN 50000 UNITS IM SOLR
INTRAMUSCULAR | Status: AC
  Filled 2012-10-01: qty 1

## 2012-10-01 MED ORDER — METHOCARBAMOL 500 MG PO TABS
500.0000 mg | ORAL_TABLET | Freq: Four times a day (QID) | ORAL | Status: DC | PRN
  Administered 2012-10-01 – 2012-10-02 (×2): 500 mg via ORAL
  Filled 2012-10-01 (×2): qty 1

## 2012-10-01 MED ORDER — 0.9 % SODIUM CHLORIDE (POUR BTL) OPTIME
TOPICAL | Status: DC | PRN
  Administered 2012-10-01: 1000 mL

## 2012-10-01 MED ORDER — HEMOSTATIC AGENTS (NO CHARGE) OPTIME
TOPICAL | Status: DC | PRN
  Administered 2012-10-01: 1 via TOPICAL

## 2012-10-01 MED ORDER — SENNA 8.6 MG PO TABS
1.0000 | ORAL_TABLET | Freq: Two times a day (BID) | ORAL | Status: DC
  Administered 2012-10-01 – 2012-10-02 (×2): 8.6 mg via ORAL
  Filled 2012-10-01 (×3): qty 1

## 2012-10-01 MED ORDER — INSULIN ASPART 100 UNIT/ML ~~LOC~~ SOLN
3.0000 [IU] | Freq: Three times a day (TID) | SUBCUTANEOUS | Status: DC
  Administered 2012-10-02: 3 [IU] via SUBCUTANEOUS

## 2012-10-01 MED ORDER — ONDANSETRON HCL 4 MG/2ML IJ SOLN
INTRAMUSCULAR | Status: DC | PRN
  Administered 2012-10-01: 4 mg via INTRAVENOUS

## 2012-10-01 MED ORDER — ACETAMINOPHEN 10 MG/ML IV SOLN
1000.0000 mg | Freq: Once | INTRAVENOUS | Status: AC
  Administered 2012-10-01: 1000 mg via INTRAVENOUS

## 2012-10-01 MED ORDER — SODIUM CHLORIDE 0.9 % IJ SOLN: 3.0000 mL | Freq: Two times a day (BID) | INTRAMUSCULAR | Status: DC

## 2012-10-01 MED ORDER — DEXAMETHASONE SODIUM PHOSPHATE 4 MG/ML IJ SOLN
INTRAMUSCULAR | Status: DC | PRN
  Administered 2012-10-01: 4 mg via INTRAVENOUS

## 2012-10-01 MED ORDER — ACETAMINOPHEN 650 MG RE SUPP: 650.0000 mg | RECTAL | Status: DC | PRN

## 2012-10-01 MED ORDER — NEOSTIGMINE METHYLSULFATE 1 MG/ML IJ SOLN
INTRAMUSCULAR | Status: DC | PRN
  Administered 2012-10-01: 4 mg via INTRAVENOUS

## 2012-10-01 MED ORDER — METHOCARBAMOL 100 MG/ML IJ SOLN
500.0000 mg | Freq: Four times a day (QID) | INTRAVENOUS | Status: DC | PRN
  Filled 2012-10-01: qty 5

## 2012-10-01 MED ORDER — ACETAMINOPHEN 325 MG PO TABS: 650.0000 mg | ORAL_TABLET | ORAL | Status: DC | PRN

## 2012-10-01 MED ORDER — HYDROMORPHONE HCL PF 1 MG/ML IJ SOLN
INTRAMUSCULAR | Status: AC
  Filled 2012-10-01: qty 1

## 2012-10-01 MED ORDER — MIDAZOLAM HCL 5 MG/5ML IJ SOLN
INTRAMUSCULAR | Status: DC | PRN
  Administered 2012-10-01 (×2): 1 mg via INTRAVENOUS

## 2012-10-01 MED ORDER — BUPIVACAINE HCL (PF) 0.25 % IJ SOLN
INTRAMUSCULAR | Status: DC | PRN
  Administered 2012-10-01: 4 mL

## 2012-10-01 MED ORDER — LACTATED RINGERS IV SOLN
INTRAVENOUS | Status: DC | PRN
  Administered 2012-10-01 (×2): via INTRAVENOUS

## 2012-10-01 MED ORDER — OXYCODONE-ACETAMINOPHEN 5-325 MG PO TABS: 1.0000 | ORAL_TABLET | ORAL | Status: DC | PRN

## 2012-10-01 MED ORDER — ACETAMINOPHEN 10 MG/ML IV SOLN
1000.0000 mg | Freq: Four times a day (QID) | INTRAVENOUS | Status: DC
  Administered 2012-10-01 – 2012-10-02 (×2): 1000 mg via INTRAVENOUS
  Filled 2012-10-01 (×2): qty 100

## 2012-10-01 MED ORDER — INSULIN ASPART 100 UNIT/ML ~~LOC~~ SOLN
0.0000 [IU] | Freq: Three times a day (TID) | SUBCUTANEOUS | Status: DC
  Administered 2012-10-02: 1 [IU] via SUBCUTANEOUS

## 2012-10-01 SURGICAL SUPPLY — 55 items
ALLOGRAFT LORDOTIC 8X11X14 (Bone Implant) ×2 IMPLANT
APL SKNCLS STERI-STRIP NONHPOA (GAUZE/BANDAGES/DRESSINGS) ×1
BAG DECANTER FOR FLEXI CONT (MISCELLANEOUS) ×2 IMPLANT
BENZOIN TINCTURE PRP APPL 2/3 (GAUZE/BANDAGES/DRESSINGS) ×2 IMPLANT
BUR MATCHSTICK NEURO 3.0 LAGG (BURR) ×2 IMPLANT
CANISTER SUCTION 2500CC (MISCELLANEOUS) ×2 IMPLANT
CLOTH BEACON ORANGE TIMEOUT ST (SAFETY) ×2 IMPLANT
CONT SPEC 4OZ CLIKSEAL STRL BL (MISCELLANEOUS) ×2 IMPLANT
DRAPE C-ARM 42X72 X-RAY (DRAPES) ×4 IMPLANT
DRAPE LAPAROTOMY 100X72 PEDS (DRAPES) ×2 IMPLANT
DRAPE MICROSCOPE LEICA (MISCELLANEOUS) ×1 IMPLANT
DRAPE MICROSCOPE ZEISS OPMI (DRAPES) ×1 IMPLANT
DRAPE POUCH INSTRU U-SHP 10X18 (DRAPES) ×2 IMPLANT
DRESSING TELFA 8X3 (GAUZE/BANDAGES/DRESSINGS) ×2 IMPLANT
DRILL BIT HELIX (BIT) ×1 IMPLANT
DRSG OPSITE 4X5.5 SM (GAUZE/BANDAGES/DRESSINGS) ×2 IMPLANT
DURAPREP 6ML APPLICATOR 50/CS (WOUND CARE) ×2 IMPLANT
ELECT COATED BLADE 2.86 ST (ELECTRODE) ×2 IMPLANT
ELECT REM PT RETURN 9FT ADLT (ELECTROSURGICAL) ×2
ELECTRODE REM PT RTRN 9FT ADLT (ELECTROSURGICAL) ×1 IMPLANT
GAUZE SPONGE 4X4 16PLY XRAY LF (GAUZE/BANDAGES/DRESSINGS) IMPLANT
GLOVE BIO SURGEON STRL SZ 6.5 (GLOVE) ×1 IMPLANT
GLOVE BIOGEL M 8.0 STRL (GLOVE) ×2 IMPLANT
GLOVE BIOGEL PI IND STRL 6.5 (GLOVE) IMPLANT
GLOVE BIOGEL PI IND STRL 7.0 (GLOVE) IMPLANT
GLOVE BIOGEL PI INDICATOR 6.5 (GLOVE) ×1
GLOVE BIOGEL PI INDICATOR 7.0 (GLOVE) ×2
GLOVE SS BIOGEL STRL SZ 6.5 (GLOVE) IMPLANT
GLOVE SUPERSENSE BIOGEL SZ 6.5 (GLOVE) ×2
GOWN BRE IMP SLV AUR LG STRL (GOWN DISPOSABLE) ×2 IMPLANT
GOWN BRE IMP SLV AUR XL STRL (GOWN DISPOSABLE) ×2 IMPLANT
GOWN STRL REIN 2XL LVL4 (GOWN DISPOSABLE) IMPLANT
HEMOSTAT POWDER KIT SURGIFOAM (HEMOSTASIS) ×2 IMPLANT
KIT BASIN OR (CUSTOM PROCEDURE TRAY) ×2 IMPLANT
KIT ROOM TURNOVER OR (KITS) ×2 IMPLANT
NDL HYPO 25X1 1.5 SAFETY (NEEDLE) ×1 IMPLANT
NDL SPNL 20GX3.5 QUINCKE YW (NEEDLE) ×1 IMPLANT
NEEDLE HYPO 25X1 1.5 SAFETY (NEEDLE) ×2 IMPLANT
NEEDLE SPNL 20GX3.5 QUINCKE YW (NEEDLE) ×2 IMPLANT
NS IRRIG 1000ML POUR BTL (IV SOLUTION) ×2 IMPLANT
PACK LAMINECTOMY NEURO (CUSTOM PROCEDURE TRAY) ×2 IMPLANT
PAD ARMBOARD 7.5X6 YLW CONV (MISCELLANEOUS) ×6 IMPLANT
PLATE HELIZ-R 40MM (Plate) ×1 IMPLANT
RUBBERBAND STERILE (MISCELLANEOUS) ×4 IMPLANT
SCREW 4.0X13 (Screw) IMPLANT
SCREW 4.0X13MM (Screw) ×6 IMPLANT
SPONGE INTESTINAL PEANUT (DISPOSABLE) ×2 IMPLANT
SPONGE SURGIFOAM ABS GEL SZ50 (HEMOSTASIS) ×2 IMPLANT
STRIP CLOSURE SKIN 1/2X4 (GAUZE/BANDAGES/DRESSINGS) ×2 IMPLANT
SUT VIC AB 3-0 SH 8-18 (SUTURE) ×4 IMPLANT
SYR 20ML ECCENTRIC (SYRINGE) ×2 IMPLANT
TOWEL OR 17X24 6PK STRL BLUE (TOWEL DISPOSABLE) ×2 IMPLANT
TOWEL OR 17X26 10 PK STRL BLUE (TOWEL DISPOSABLE) ×2 IMPLANT
TRAP SPECIMEN MUCOUS 40CC (MISCELLANEOUS) ×1 IMPLANT
WATER STERILE IRR 1000ML POUR (IV SOLUTION) ×2 IMPLANT

## 2012-10-01 NOTE — Anesthesia Procedure Notes (Signed)
Procedure Name: Intubation Date/Time: 10/01/2012 2:25 PM Performed by: Gayla Medicus Pre-anesthesia Checklist: Emergency Drugs available, Patient identified, Timeout performed, Suction available and Patient being monitored Patient Re-evaluated:Patient Re-evaluated prior to inductionOxygen Delivery Method: Circle system utilized Preoxygenation: Pre-oxygenation with 100% oxygen Intubation Type: IV induction Ventilation: Mask ventilation without difficulty and Oral airway inserted - appropriate to patient size Laryngoscope Size: Mac and 4 Grade View: Grade II Tube type: Oral Tube size: 7.5 mm Number of attempts: 1 Airway Equipment and Method: Stylet and LTA kit utilized Placement Confirmation: ETT inserted through vocal cords under direct vision,  positive ETCO2 and breath sounds checked- equal and bilateral Secured at: 22 cm Tube secured with: Tape Dental Injury: Teeth and Oropharynx as per pre-operative assessment

## 2012-10-01 NOTE — H&P (Signed)
Subjective:   Patient is a 62 y.o. male admitted for ACDF. The patient first presented to me with complaints of neck pain and shooting pains in the arm(s). Onset of symptoms was a few weeks ago. The pain is described as aching, sharp and stabbing and occurs all day. The pain is rated intense, and is located at the base of the neck and radiates to the RUE. The symptoms have been progressive. Symptoms are exacerbated by extending head backwards, and are relieved by none.  Previous work up includes CT of cervical spine, results: disc bulge at C5-C6 and C6-C7 right.  Past Medical History  Diagnosis Date  . Diabetes mellitus   . Hyperlipidemia   . Hypertension     Past Surgical History  Procedure Laterality Date  . Colonoscopy w/ polypectomy  04/03/2010    4 polyps 8-25mm, worst TV adenoma with high-grade dysplasia  . Colonoscopy  09/30/2010    diverticulosis, small internal hemorrhoids  . No past surgeries      Allergies  Allergen Reactions  . Penicillins     As child/mother is allergic/pt does not take    History  Substance Use Topics  . Smoking status: Former Smoker -- 2 years    Quit date: 09/22/1969  . Smokeless tobacco: Not on file  . Alcohol Use: No    History reviewed. No pertinent family history. Prior to Admission medications   Medication Sig Start Date End Date Taking? Authorizing Provider  aspirin 81 MG tablet Take 81 mg by mouth daily.     Yes Historical Provider, MD  benazepril-hydrochlorthiazide (LOTENSIN HCT) 20-25 MG per tablet Take 1 tablet by mouth daily.     Yes Historical Provider, MD  calcium carbonate (OS-CAL) 600 MG TABS Take 600 mg by mouth 2 (two) times daily with a meal.     Yes Historical Provider, MD  felodipine (PLENDIL) 5 MG 24 hr tablet Take 5 mg by mouth daily.     Yes Historical Provider, MD  ibuprofen (ADVIL,MOTRIN) 200 MG tablet Take 200 mg by mouth every 6 (six) hours as needed for pain.   Yes Historical Provider, MD  Insulin Glargine (LANTUS [place])  Inject 40 Units into the skin at bedtime.    Yes Historical Provider, MD  insulin glulisine (APIDRA) 100 UNIT/ML injection Inject 10 Units into the skin 3 (three) times daily before meals.     Yes Historical Provider, MD  metFORMIN (GLUCOPHAGE) 1000 MG tablet Take 1,000 mg by mouth 2 (two) times daily with a meal.     Yes Historical Provider, MD  methocarbamol (ROBAXIN) 500 MG tablet Take 500 mg by mouth 3 (three) times daily.   Yes Historical Provider, MD  oxyCODONE-acetaminophen (PERCOCET/ROXICET) 5-325 MG per tablet Take 2 tablets by mouth every 4 (four) hours as needed for pain. 09/27/12  Yes Hurman Horn, MD  rosuvastatin (CRESTOR) 10 MG tablet Take 10 mg by mouth daily.     Yes Historical Provider, MD     Review of Systems  Positive ROS: neg  All other systems have been reviewed and were otherwise negative with the exception of those mentioned in the HPI and as above.  Objective: Vital signs in last 24 hours: Temp:  [98 F (36.7 C)] 98 F (36.7 C) (05/23 1155) Pulse Rate:  [57] 57 (05/23 1155) Resp:  [20] 20 (05/23 1155) BP: (153)/(73) 153/73 mmHg (05/23 1155) SpO2:  [99 %] 99 % (05/23 1155)  General Appearance: Alert, cooperative, no distress, appears stated age Head:  Normocephalic, without obvious abnormality, atraumatic Eyes: PERRL, conjunctiva/corneas clear, EOM's intact, fundi benign, both eyes      Ears: Normal TM's and external ear canals, both ears Throat: Lips, mucosa, and tongue normal; teeth and gums normal Neck: Supple, symmetrical, trachea midline, no adenopathy; thyroid: No enlargement/tenderness/nodules; no carotid bruit or JVD Back: Symmetric, no curvature, ROM normal, no CVA tenderness Lungs: Clear to auscultation bilaterally, respirations unlabored Heart: Regular rate and rhythm, S1 and S2 normal, no murmur, rub or gallop Abdomen: Soft, non-tender, bowel sounds active all four quadrants, no masses, no organomegaly Extremities: Extremities normal, atraumatic,  no cyanosis or edema Pulses: 2+ and symmetric all extremities Skin: Skin color, texture, turgor normal, no rashes or lesions  NEUROLOGIC:  Mental status: Alert and oriented x4, no aphasia, good attention span, fund of knowledge and memory  Motor Exam - grossly normal, some weakness R tricep Sensory Exam - grossly normal Reflexes: 1+ Coordination - grossly normal Gait - grossly normal Balance - grossly normal Cranial Nerves: I: smell Not tested  II: visual acuity  OS: nl    OD: nl  II: visual fields Full to confrontation  II: pupils Equal, round, reactive to light  III,VII: ptosis None  III,IV,VI: extraocular muscles  Full ROM  V: mastication Normal  V: facial light touch sensation  Normal  V,VII: corneal reflex  Present  VII: facial muscle function - upper  Normal  VII: facial muscle function - lower Normal  VIII: hearing Not tested  IX: soft palate elevation  Normal  IX,X: gag reflex Present  XI: trapezius strength  5/5  XI: sternocleidomastoid strength 5/5  XI: neck flexion strength  5/5  XII: tongue strength  Normal    Data Review Lab Results  Component Value Date   WBC 16.7* 09/30/2012   HGB 14.7 09/30/2012   HCT 43.0 09/30/2012   MCV 87.8 09/30/2012   PLT 297 09/30/2012   Lab Results  Component Value Date   NA 136 09/30/2012   K 4.1 09/30/2012   CL 96 09/30/2012   CO2 28 09/30/2012   BUN 24* 09/30/2012   CREATININE 0.84 09/30/2012   GLUCOSE 191* 09/30/2012   No results found for this basename: INR, PROTIME    Assessment:   Cervical neck pain with herniated nucleus pulposus/ spondylosis/ stenosis at C5-6 C6-7. Patient has failed conservative therapy. Planned surgery : ACDF with plate Z6-1 W9-6  Plan:   I explained the condition and procedure to the patient and answered any questions.  Patient wishes to proceed with procedure as planned. Understands risks/ benefits/ and expected or typical outcomes.  JONES,DAVID S 10/01/2012 1:51 PM

## 2012-10-01 NOTE — Preoperative (Signed)
Beta Blockers   Reason not to administer Beta Blockers:Not Applicable 

## 2012-10-01 NOTE — Transfer of Care (Signed)
Immediate Anesthesia Transfer of Care Note  Patient: Kenneth [name]  Procedure(s) Performed: Procedure(s) with comments: ANTERIOR CERVICAL DECOMPRESSION/DISCECTOMY FUSION 2 LEVELS (N/A) - Cervical five-six,Cervical six-seven  Patient Location: PACU  Anesthesia Type:General  Level of Consciousness: awake, alert  and oriented  Airway & Oxygen Therapy: Patient Spontanous Breathing and Patient connected to nasal cannula oxygen  Post-op Assessment: Report given to PACU RN, Post -op Vital signs reviewed and stable and Patient moving all extremities X 4  Post vital signs: Reviewed and stable  Complications: No apparent anesthesia complications

## 2012-10-01 NOTE — Anesthesia Preprocedure Evaluation (Signed)
Anesthesia Evaluation  Patient identified by MRN, date of birth, ID band Patient awake    Reviewed: Allergy & Precautions, H&P , NPO status , Patient's Chart, lab work & pertinent test results  Airway Mallampati: II  Neck ROM: full    Dental   Pulmonary former smoker,          Cardiovascular hypertension,     Neuro/Psych    GI/Hepatic   Endo/Other  diabetes, Type 2  Renal/GU      Musculoskeletal   Abdominal   Peds  Hematology   Anesthesia Other Findings   Reproductive/Obstetrics                           Anesthesia Physical Anesthesia Plan  ASA: II  Anesthesia Plan: General   Post-op Pain Management:    Induction: Intravenous  Airway Management Planned: Oral ETT  Additional Equipment:   Intra-op Plan:   Post-operative Plan: Extubation in OR  Informed Consent: I have reviewed the patients History and Physical, chart, labs and discussed the procedure including the risks, benefits and alternatives for the proposed anesthesia with the patient or authorized representative who has indicated his/her understanding and acceptance.     Plan Discussed with: CRNA, Anesthesiologist and Surgeon  Anesthesia Plan Comments:         Anesthesia Quick Evaluation

## 2012-10-01 NOTE — Anesthesia Postprocedure Evaluation (Signed)
  Anesthesia Post-op Note  Patient: Kenneth [name]  Procedure(s) Performed: Procedure(s) with comments: ANTERIOR CERVICAL DECOMPRESSION/DISCECTOMY FUSION 2 LEVELS (N/A) - Cervical five-six,Cervical six-seven  Patient Location: PACU  Anesthesia Type:General  Level of Consciousness: awake  Airway and Oxygen Therapy: Patient Spontanous Breathing  Post-op Pain: mild  Post-op Assessment: Post-op Vital signs reviewed  Post-op Vital Signs: stable  Complications: No apparent anesthesia complications

## 2012-10-01 NOTE — Op Note (Signed)
10/01/2012  4:35 PM  PATIENT:  Kenneth [name]  62 y.o. male  PRE-OPERATIVE DIAGNOSIS:  Cervical spondylosis with HNP C5-6 C6-7, neck Pain and right radiculopathy  POST-OPERATIVE DIAGNOSIS:  Same  PROCEDURE:  1. Decompressive anterior cervical discectomy C5-6 C6-7, 2. Anterior cervical arthrodesis C5-6 C6-7 utilizing cortico-cancellus allografts, 3. Anterior cervical plating C5-C7 utilizing a helix plate  SURGEON:  Marikay Alar, MD  ASSISTANTS: None  ANESTHESIA:   General  EBL: Less than 100 ml  Total I/O In: 1000 [I.V.:1000] Out: 100 [Blood:100]  BLOOD ADMINISTERED:none  DRAINS: None   SPECIMEN:  No Specimen  INDICATION FOR PROCEDURE: This patient presented with severe neck pain and right arm pain with weakness in his right tricep. He had a CT scan which showed a herniated disc at C5-6 and C6-7 on the right with compression of the right C6 and C7 nerve roots. He tried medical management without relief. I recommended ACDF with plating at C5-6 and C6-7. Patient understood the risks, benefits, and alternatives and potential outcomes and wished to proceed.  PROCEDURE DETAILS: Patient was brought to the operating room placed under general endotracheal anesthesia. Patient was placed in the supine position on the operating room table. The neck was prepped with Duraprep and draped in a sterile fashion.   Three cc of local anesthesia was injected and a transverse incision was made on the right side of the neck.  Dissection was carried down thru the subcutaneous tissue and the platysma was  elevated, opened, and undermined with Metzenbaum scissors.  Dissection was then carried out thru an avascular plane leaving the sternocleidomastoid carotid artery and jugular vein laterally and the trachea and esophagus medially. The ventral aspect of the vertebral column was identified and a localizing x-ray was taken. The C5-6 level was identified. The longus colli muscles were then elevated at C5-6 and  C6-7 and the retractor was placed. The annulus was incised at both levels and the disc space entered. Discectomy was performed with micro-curettes and pituitary rongeurs. I then used the high-speed drill to drill the endplates down to the level of the posterior longitudinal ligament.  The operating microscope was draped and brought into the field provided additional magnification, illumination and visualization. Discectomy was continued posteriorly thru the disc space. Posterior longitudinal ligament was opened with a nerve hook at both levels, and then removed along with disc herniation and osteophytes, decompressing the spinal canal and thecal sac at both levels. We then continued to remove osteophytic overgrowth and disc material decompressing the neural foramina and exiting nerve roots bilaterally, particular attention was paid to the right side because of his right-sided radiculopathy. The scope was angled up and down to help decompress and undercut the vertebral bodies. I found a large herniated disc at C6-7 on the right compressing the right C7 nerve root. This was most likely is most symptomatic lesion. Once the decompression was completed we could pass a nerve hook circumferentially to assure adequate decompression in the midline and in the neural foramina. The uncovertebral joints were removed at C5-6 and C6-7 on the right to have very wide decompressions of the right C6 and C7 nerve roots. So by both visualization and palpation we felt we had an adequate decompression of the neural elements. We then measured the height of the intravertebral disc space and selected a 8 millimeter allograft for each level. It was then gently positioned in the intravertebral disc space and countersunk. I then used a helix plate and placed variable angle screws into  the vertebral bodies from C5-7 and locked them into position. The wound was irrigated with bacitracin solution, checked for hemostasis which was established and  confirmed. Once meticulous hemostasis was achieved, we then proceeded with closure. The platysma was closed with interrupted 3-0 undyed Vicryl suture, the subcuticular layer was closed with interrupted 3-0 undyed Vicryl suture. The skin edges were approximated with steristrips. The drapes were removed. A sterile dressing was applied. The patient was then awakened from general anesthesia and transferred to the recovery room in stable condition. At the end of the procedure all sponge, needle and instrument counts were correct.   PLAN OF CARE: Admit for overnight observation  PATIENT DISPOSITION:  PACU - hemodynamically stable.   Delay start of Pharmacological VTE agent (>24hrs) due to surgical blood loss or risk of bleeding:  yes

## 2012-10-02 LAB — GLUCOSE, CAPILLARY: Glucose-Capillary: 122 mg/dL — ABNORMAL HIGH (ref 70–99)

## 2012-10-02 NOTE — Progress Notes (Signed)
Pt. Alert and oriented,follows simple instructions, denies pain. Incision area without swelling, redness or S/S of infection. Voiding adequate clear yellow urine. Moving all extremities well and vitals stable and documented. Anterior Cervical Fusion surgery notes instructions given to patient and family member for home safety and precautions. Pt. and family stated understanding of instructions given. 

## 2012-10-02 NOTE — Discharge Summary (Signed)
Physician Discharge Summary  Patient ID: Kenneth [name] MRN: [id number] DOB/AGE: 1950-10-24 62 y.o.  Admit date: 10/01/2012 Discharge date: 10/02/2012  Admission Diagnoses: cervical radiculopathy   Discharge Diagnoses: same   Condition: Good  Hospital Course: The patient was admitted on 10/01/2012 and taken to the operating room where the patient underwent ACDF C5-6 C6-7. The patient tolerated the procedure well and was taken to the recovery room and then to the floor in stable condition. The hospital course was routine. There were no complications. The wound remained clean dry and intact. Pt had appropriate neck soreness. No complaints of arm pain or new N/T/W. The patient remained afebrile with stable vital signs, and tolerated a regular diet. The patient continued to increase activities, and pain was well controlled with oral pain medications.   Consults: none  Significant Diagnostic Studies:  Results for orders placed during the hospital encounter of 10/01/12  GLUCOSE, CAPILLARY      Result Value Range   Glucose-Capillary 109 (*) 70 - 99 mg/dL  GLUCOSE, CAPILLARY      Result Value Range   Glucose-Capillary 163 (*) 70 - 99 mg/dL  GLUCOSE, CAPILLARY      Result Value Range   Glucose-Capillary 147 (*) 70 - 99 mg/dL  GLUCOSE, CAPILLARY      Result Value Range   Glucose-Capillary 261 (*) 70 - 99 mg/dL    Dg Chest 2 View  11/08/5282   *RADIOLOGY REPORT*  Clinical Data: Preop for cervical surgery  CHEST - 2 VIEW  Comparison: 10/18/2010  Findings: Cardiomediastinal silhouette is stable.  No acute infiltrate or pleural effusion.  No pulmonary edema.  Mild degenerative changes thoracic spine.  Mild hyperinflation.  IMPRESSION:  No active disease.  Mild hyperinflation again noted.   Original Report Authenticated By: Natasha Mead, M.D.   Dg Cervical Spine 2-3 Views  10/01/2012   *RADIOLOGY REPORT*  Clinical Data: Cervical fusion  CERVICAL SPINE - 2-3 VIEW  Comparison: 09/28/2012   Findings: Anterior plate and screws are present at C5, C6, and C7 with interposed tubular bone plugs.  There is anatomic alignment through the top of C7.  No breakage or loosening of the hardware.  IMPRESSION: Anterior discectomy and fusion at C5, C6, and C7 without complication.   Original Report Authenticated By: Jolaine Click, M.D.   Ct Cervical Spine Wo Contrast  09/28/2012   *RADIOLOGY REPORT*  Clinical Data: Severe right shoulder and arm pain for 2 weeks. Right index finger numbness.  No known injury. The patient reportedly attempted MRI but was unable to complete due to pain.  CT CERVICAL SPINE WITHOUT CONTRAST  Technique:  Multidetector CT imaging of the cervical spine was performed. Multiplanar CT image reconstructions were also generated.  Comparison: None.  Findings: The cervical alignment is normal.  There is no evidence of acute fracture or subluxation.  No paraspinal abnormalities are identified.  There is a low density 1.7 x 1.4 cm nodule projecting posteriorly from the inferior aspect of the right thyroid lobe.  The lung apices are clear.  C2-C3:  Normal interspace.  C3-C4:  Shallow left paracentral disc protrusion mildly narrows the AP diameter of the canal to 9 mm.  There is no significant foraminal stenosis.  C4-C5:  There is disc bulging with left greater than right uncinate spurring contributing to mild left foraminal stenosis.  The AP diameter of the canal is 9 mm.  C5-C6:  There is spondylosis with posterior osteophytes and uncinate spurring bilaterally.  Mild osseous foraminal stenosis is  present bilaterally.  In addition, the perineural fat within the right foramen appears partially obscured.  The AP diameter of the canal is approximately 7 mm.  C6-C7:  There is a shallow left paracentral disc protrusion.  Mild uncinate spurring is present without cord deformity or significant foraminal compromise.  C7-T1:  Bilateral facet hypertrophy, worse on the left.  No significant spinal stenosis  identified.  IMPRESSION:  1.  Spondylosis and small disc protrusions contribute to mild to moderate central stenosis as described, greatest at C5-C6. 2.  No high-grade osseous foraminal stenosis is demonstrated. There is mild osseous foraminal stenosis bilaterally at C5-C6 with a possible superimposed right foraminal disc protrusion.  This is not optimally evaluated by routine CT. 3.  If the patient has persistent radicular symptoms, further evaluation with CT myelography or repeat attempted MRI should be considered. 4.  Exophytic right thyroid or parathyroid nodule. Consider further evaluation with thyroid ultrasound.  If patient is clinically hyperthyroid, consider nuclear medicine thyroid uptake and scan.   Original Report Authenticated By: Carey Bullocks, M.D.   Dg C-arm 1-60 Min  10/01/2012   Please refer to accession number [id number].   Original Report Authenticated By: Jolaine Click, M.D.    Antibiotics:  Anti-infectives   Start     Dose/Rate Route Frequency Ordered Stop   10/01/12 1526  bacitracin 50,000 Units in sodium chloride irrigation 0.9 % 500 mL irrigation  Status:  Discontinued       As needed 10/01/12 1527 10/01/12 1639   10/01/12 1415  vancomycin (VANCOCIN) 1,000 mg in sodium chloride 0.9 % 250 mL IVPB  Status:  Discontinued     1,000 mg 250 mL/hr over 60 Minutes Intravenous Every 12 hours 10/01/12 1406 10/01/12 1740   10/01/12 1415  bacitracin 19147 UNITS injection    Comments:  STEELMAN, CRAIG: cabinet override      10/01/12 1415 10/02/12 0229   10/01/12 1406  vancomycin (VANCOCIN) 1 GM/200ML IVPB    Comments:  HYPES, KAREN: cabinet override      10/01/12 1406 10/02/12 0214      Discharge Exam: Blood pressure 143/87, pulse 54, temperature 97.7 F (36.5 C), temperature source Oral, resp. rate 16, SpO2 94.00%.  Physical exam normal, amb well, good strength Incision CDI  Discharge Medications:     Medication List    ASK your doctor about these medications        aspirin 81 MG tablet  Take 81 mg by mouth daily.     benazepril-hydrochlorthiazide 20-25 MG per tablet  Commonly known as:  LOTENSIN HCT  Take 1 tablet by mouth daily.     calcium carbonate 600 MG Tabs  Commonly known as:  OS-CAL  Take 600 mg by mouth 2 (two) times daily with a meal.     felodipine 5 MG 24 hr tablet  Commonly known as:  PLENDIL  Take 5 mg by mouth daily.     ibuprofen 200 MG tablet  Commonly known as:  ADVIL,MOTRIN  Take 200 mg by mouth every 6 (six) hours as needed for pain.     insulin glulisine 100 UNIT/ML injection  Commonly known as:  APIDRA  Inject 10 Units into the skin 3 (three) times daily before meals.     LANTUS [place]  Inject 40 Units into the skin at bedtime.     metFORMIN 1000 MG tablet  Commonly known as:  GLUCOPHAGE  Take 1,000 mg by mouth 2 (two) times daily with a meal.     methocarbamol  500 MG tablet  Commonly known as:  ROBAXIN  Take 500 mg by mouth 3 (three) times daily.     oxyCODONE-acetaminophen 5-325 MG per tablet  Commonly known as:  PERCOCET/ROXICET  Take 2 tablets by mouth every 4 (four) hours as needed for pain.     rosuvastatin 10 MG tablet  Commonly known as:  CRESTOR  Take 10 mg by mouth daily.        Disposition: home   Final Dx: ACDF        Follow-up Information   Follow up with JONES,DAVID S, MD In 3 weeks.   Contact information:   [address] [place] [address] [phone]        Signed: Tia Alert 10/02/2012, 7:45 AM

## 2012-10-05 ENCOUNTER — Encounter (HOSPITAL_COMMUNITY): Payer: Self-pay | Admitting: Neurological Surgery

## 2012-10-07 ENCOUNTER — Ambulatory Visit
Admission: RE | Admit: 2012-10-07 | Discharge: 2012-10-07 | Disposition: A | Discharge status: Home / Self Care | Payer: BC Managed Care – PPO | Source: Ambulatory Visit | Attending: Neurological Surgery | Admitting: Neurological Surgery

## 2012-10-07 ENCOUNTER — Other Ambulatory Visit: Payer: Self-pay | Admitting: Neurological Surgery

## 2012-10-07 DIAGNOSIS — M5412 Radiculopathy, cervical region: Secondary | ICD-10-CM

## 2012-10-18 ENCOUNTER — Other Ambulatory Visit: Payer: Self-pay | Admitting: Neurological Surgery

## 2012-10-18 ENCOUNTER — Ambulatory Visit
Admission: RE | Admit: 2012-10-18 | Discharge: 2012-10-18 | Disposition: A | Discharge status: Home / Self Care | Payer: BC Managed Care – PPO | Source: Ambulatory Visit | Attending: Neurological Surgery | Admitting: Neurological Surgery

## 2012-10-18 DIAGNOSIS — S129XXA Fracture of neck, unspecified, initial encounter: Secondary | ICD-10-CM

## 2012-10-19 ENCOUNTER — Encounter (HOSPITAL_COMMUNITY): Payer: Self-pay | Admitting: *Deleted

## 2012-10-19 ENCOUNTER — Other Ambulatory Visit: Payer: Self-pay | Admitting: Neurological Surgery

## 2012-10-19 ENCOUNTER — Encounter (HOSPITAL_COMMUNITY): Payer: Self-pay | Admitting: Pharmacy Technician

## 2012-10-20 ENCOUNTER — Encounter (HOSPITAL_COMMUNITY): Payer: Self-pay | Admitting: Critical Care Medicine

## 2012-10-20 ENCOUNTER — Ambulatory Visit (HOSPITAL_COMMUNITY): Payer: BC Managed Care – PPO

## 2012-10-20 ENCOUNTER — Ambulatory Visit (HOSPITAL_COMMUNITY)
Admission: RE | Admit: 2012-10-20 | Discharge: 2012-10-24 | Disposition: A | Discharge status: Home / Self Care | Payer: BC Managed Care – PPO | Source: Ambulatory Visit | Attending: Neurological Surgery | Admitting: Neurological Surgery

## 2012-10-20 ENCOUNTER — Other Ambulatory Visit: Payer: Self-pay | Admitting: Neurological Surgery

## 2012-10-20 ENCOUNTER — Encounter (HOSPITAL_COMMUNITY)
Admission: RE | Disposition: A | Discharge status: Home / Self Care | Payer: Self-pay | Source: Ambulatory Visit | Attending: Neurological Surgery

## 2012-10-20 ENCOUNTER — Ambulatory Visit (HOSPITAL_COMMUNITY): Payer: BC Managed Care – PPO | Admitting: Critical Care Medicine

## 2012-10-20 DIAGNOSIS — S12400A Unspecified displaced fracture of fifth cervical vertebra, initial encounter for closed fracture: Secondary | ICD-10-CM | POA: Insufficient documentation

## 2012-10-20 DIAGNOSIS — X58XXXA Exposure to other specified factors, initial encounter: Secondary | ICD-10-CM | POA: Insufficient documentation

## 2012-10-20 DIAGNOSIS — E785 Hyperlipidemia, unspecified: Secondary | ICD-10-CM | POA: Insufficient documentation

## 2012-10-20 DIAGNOSIS — Z981 Arthrodesis status: Secondary | ICD-10-CM | POA: Insufficient documentation

## 2012-10-20 DIAGNOSIS — R339 Retention of urine, unspecified: Secondary | ICD-10-CM | POA: Insufficient documentation

## 2012-10-20 DIAGNOSIS — Z794 Long term (current) use of insulin: Secondary | ICD-10-CM | POA: Insufficient documentation

## 2012-10-20 DIAGNOSIS — E119 Type 2 diabetes mellitus without complications: Secondary | ICD-10-CM | POA: Insufficient documentation

## 2012-10-20 DIAGNOSIS — I10 Essential (primary) hypertension: Secondary | ICD-10-CM | POA: Insufficient documentation

## 2012-10-20 DIAGNOSIS — T84498A Other mechanical complication of other internal orthopedic devices, implants and grafts, initial encounter: Secondary | ICD-10-CM | POA: Insufficient documentation

## 2012-10-20 DIAGNOSIS — Z79899 Other long term (current) drug therapy: Secondary | ICD-10-CM | POA: Insufficient documentation

## 2012-10-20 HISTORY — PX: POSTERIOR CERVICAL FUSION/FORAMINOTOMY: SHX5038

## 2012-10-20 LAB — BASIC METABOLIC PANEL
CO2: 32 mEq/L (ref 19–32)
Calcium: 9.4 mg/dL (ref 8.4–10.5)
Creatinine, Ser: 0.73 mg/dL (ref 0.50–1.35)
GFR calc non Af Amer: >90 mL/min (ref >90)
Glucose, Bld: 135 mg/dL — ABNORMAL HIGH (ref 70–99)

## 2012-10-20 LAB — GLUCOSE, CAPILLARY
Glucose-Capillary: 135 mg/dL — ABNORMAL HIGH (ref 70–99)
Glucose-Capillary: 138 mg/dL — ABNORMAL HIGH (ref 70–99)
Glucose-Capillary: 289 mg/dL — ABNORMAL HIGH (ref 70–99)

## 2012-10-20 LAB — CBC
MCH: 30.2 pg (ref 26.0–34.0)
MCV: 87.1 fL (ref 78.0–100.0)
Platelets: 276 10*3/uL (ref 150–400)
RDW: 12.8 % (ref 11.5–15.5)

## 2012-10-20 SURGERY — POSTERIOR CERVICAL FUSION/FORAMINOTOMY LEVEL 3
Anesthesia: General | Laterality: Left | Wound class: Clean

## 2012-10-20 MED ORDER — PROMETHAZINE HCL 25 MG/ML IJ SOLN: 6.2500 mg | INTRAMUSCULAR | Status: DC | PRN

## 2012-10-20 MED ORDER — MORPHINE SULFATE 2 MG/ML IJ SOLN
1.0000 mg | INTRAMUSCULAR | Status: DC | PRN
  Administered 2012-10-20: 4 mg via INTRAVENOUS
  Administered 2012-10-21: 2 mg via INTRAVENOUS
  Administered 2012-10-21 – 2012-10-22 (×6): 4 mg via INTRAVENOUS
  Filled 2012-10-20: qty 2
  Filled 2012-10-20: qty 1
  Filled 2012-10-20 (×6): qty 2

## 2012-10-20 MED ORDER — LACTATED RINGERS IV SOLN
INTRAVENOUS | Status: DC | PRN
  Administered 2012-10-20 (×2): via INTRAVENOUS

## 2012-10-20 MED ORDER — VANCOMYCIN HCL IN DEXTROSE 1-5 GM/200ML-% IV SOLN
INTRAVENOUS | Status: AC
  Administered 2012-10-20: 1000 mg via INTRAVENOUS
  Filled 2012-10-20: qty 200

## 2012-10-20 MED ORDER — BUPIVACAINE HCL (PF) 0.25 % IJ SOLN
INTRAMUSCULAR | Status: DC | PRN
  Administered 2012-10-20: 10 mL

## 2012-10-20 MED ORDER — GLYCOPYRROLATE 0.2 MG/ML IJ SOLN
INTRAMUSCULAR | Status: DC | PRN
  Administered 2012-10-20: 0.6 mg via INTRAVENOUS

## 2012-10-20 MED ORDER — THROMBIN 20000 UNITS EX KIT
PACK | CUTANEOUS | Status: DC | PRN
  Administered 2012-10-20: 20000 [IU] via TOPICAL

## 2012-10-20 MED ORDER — ACETAMINOPHEN 10 MG/ML IV SOLN
1000.0000 mg | Freq: Four times a day (QID) | INTRAVENOUS | Status: AC
  Administered 2012-10-20 – 2012-10-21 (×3): 1000 mg via INTRAVENOUS
  Filled 2012-10-20 (×4): qty 100

## 2012-10-20 MED ORDER — DIAZEPAM 5 MG PO TABS
ORAL_TABLET | ORAL | Status: AC
  Filled 2012-10-20: qty 1

## 2012-10-20 MED ORDER — FELODIPINE ER 5 MG PO TB24
5.0000 mg | ORAL_TABLET | Freq: Every day | ORAL | Status: DC
  Administered 2012-10-21 – 2012-10-24 (×4): 5 mg via ORAL
  Filled 2012-10-20 (×4): qty 1

## 2012-10-20 MED ORDER — NEOSTIGMINE METHYLSULFATE 1 MG/ML IJ SOLN
INTRAMUSCULAR | Status: DC | PRN
  Administered 2012-10-20: 4 mg via INTRAVENOUS

## 2012-10-20 MED ORDER — MEPERIDINE HCL 25 MG/ML IJ SOLN: 6.2500 mg | INTRAMUSCULAR | Status: DC | PRN

## 2012-10-20 MED ORDER — ARTIFICIAL TEARS OP OINT
TOPICAL_OINTMENT | OPHTHALMIC | Status: DC | PRN
  Administered 2012-10-20: 1 via OPHTHALMIC

## 2012-10-20 MED ORDER — OXYCODONE HCL 5 MG PO TABS: 5.0000 mg | ORAL_TABLET | Freq: Once | ORAL | Status: DC | PRN

## 2012-10-20 MED ORDER — SODIUM CHLORIDE 0.9 % IR SOLN
Status: DC | PRN
  Administered 2012-10-20: 15:00:00

## 2012-10-20 MED ORDER — ROCURONIUM BROMIDE 100 MG/10ML IV SOLN
INTRAVENOUS | Status: DC | PRN
  Administered 2012-10-20: 10 mg via INTRAVENOUS
  Administered 2012-10-20: 50 mg via INTRAVENOUS

## 2012-10-20 MED ORDER — MENTHOL 3 MG MT LOZG: 1.0000 | LOZENGE | OROMUCOSAL | Status: DC | PRN

## 2012-10-20 MED ORDER — 0.9 % SODIUM CHLORIDE (POUR BTL) OPTIME
TOPICAL | Status: DC | PRN
  Administered 2012-10-20: 1000 mL

## 2012-10-20 MED ORDER — INSULIN DETEMIR 100 UNIT/ML ~~LOC~~ SOLN
10.0000 [IU] | Freq: Every morning | SUBCUTANEOUS | Status: DC
  Administered 2012-10-23 – 2012-10-24 (×2): 10 [IU] via SUBCUTANEOUS
  Filled 2012-10-20 (×4): qty 0.1

## 2012-10-20 MED ORDER — EPHEDRINE SULFATE 50 MG/ML IJ SOLN
INTRAMUSCULAR | Status: DC | PRN
  Administered 2012-10-20: 5 mg via INTRAVENOUS
  Administered 2012-10-20: 10 mg via INTRAVENOUS
  Administered 2012-10-20 (×2): 5 mg via INTRAVENOUS

## 2012-10-20 MED ORDER — PHENOL 1.4 % MT LIQD: 1.0000 | OROMUCOSAL | Status: DC | PRN

## 2012-10-20 MED ORDER — OXYCODONE HCL 5 MG PO TABS
10.0000 mg | ORAL_TABLET | ORAL | Status: DC | PRN
  Administered 2012-10-20 – 2012-10-24 (×17): 10 mg via ORAL
  Filled 2012-10-20 (×16): qty 2

## 2012-10-20 MED ORDER — ACETAMINOPHEN 325 MG PO TABS
650.0000 mg | ORAL_TABLET | ORAL | Status: DC | PRN
  Administered 2012-10-22 – 2012-10-24 (×9): 650 mg via ORAL
  Filled 2012-10-20 (×9): qty 2

## 2012-10-20 MED ORDER — ONDANSETRON HCL 4 MG/2ML IJ SOLN
INTRAMUSCULAR | Status: DC | PRN
  Administered 2012-10-20: 4 mg via INTRAVENOUS

## 2012-10-20 MED ORDER — METFORMIN HCL 500 MG PO TABS
1000.0000 mg | ORAL_TABLET | Freq: Two times a day (BID) | ORAL | Status: DC
  Administered 2012-10-21 – 2012-10-24 (×7): 1000 mg via ORAL
  Filled 2012-10-20 (×9): qty 2

## 2012-10-20 MED ORDER — ACETAMINOPHEN 650 MG RE SUPP: 650.0000 mg | RECTAL | Status: DC | PRN

## 2012-10-20 MED ORDER — SODIUM CHLORIDE 0.9 % IV SOLN
INTRAVENOUS | Status: AC
  Filled 2012-10-20: qty 500

## 2012-10-20 MED ORDER — BACITRACIN 50000 UNITS IM SOLR
INTRAMUSCULAR | Status: AC
  Filled 2012-10-20: qty 1

## 2012-10-20 MED ORDER — POTASSIUM CHLORIDE IN NACL 20-0.9 MEQ/L-% IV SOLN
INTRAVENOUS | Status: DC
  Filled 2012-10-20 (×8): qty 1000

## 2012-10-20 MED ORDER — DEXAMETHASONE SODIUM PHOSPHATE 10 MG/ML IJ SOLN
INTRAMUSCULAR | Status: DC | PRN
  Administered 2012-10-20: 10 mg via INTRAVENOUS

## 2012-10-20 MED ORDER — MIDAZOLAM HCL 2 MG/2ML IJ SOLN
INTRAMUSCULAR | Status: AC
  Administered 2012-10-20: 2 mg via INTRAVENOUS
  Filled 2012-10-20: qty 2

## 2012-10-20 MED ORDER — DIAZEPAM 5 MG PO TABS
5.0000 mg | ORAL_TABLET | Freq: Four times a day (QID) | ORAL | Status: DC | PRN
  Administered 2012-10-20 – 2012-10-24 (×10): 5 mg via ORAL
  Filled 2012-10-20 (×10): qty 1

## 2012-10-20 MED ORDER — HEMOSTATIC AGENTS (NO CHARGE) OPTIME
TOPICAL | Status: DC | PRN
  Administered 2012-10-20: 1 via TOPICAL

## 2012-10-20 MED ORDER — OXYCODONE HCL 5 MG PO TABS
ORAL_TABLET | ORAL | Status: AC
  Filled 2012-10-20: qty 2

## 2012-10-20 MED ORDER — ACETAMINOPHEN 10 MG/ML IV SOLN
INTRAVENOUS | Status: AC
  Administered 2012-10-20: 1000 mg via INTRAVENOUS
  Filled 2012-10-20: qty 100

## 2012-10-20 MED ORDER — BENAZEPRIL-HYDROCHLOROTHIAZIDE 20-25 MG PO TABS: 1.0000 | ORAL_TABLET | Freq: Every day | ORAL | Status: DC

## 2012-10-20 MED ORDER — MIDAZOLAM HCL 2 MG/2ML IJ SOLN: 0.5000 mg | Freq: Once | INTRAMUSCULAR | Status: AC | PRN

## 2012-10-20 MED ORDER — MIDAZOLAM HCL 5 MG/5ML IJ SOLN
INTRAMUSCULAR | Status: DC | PRN
  Administered 2012-10-20: 2 mg via INTRAVENOUS

## 2012-10-20 MED ORDER — HYDROMORPHONE HCL PF 1 MG/ML IJ SOLN
INTRAMUSCULAR | Status: AC
  Administered 2012-10-20: 0.5 mg via INTRAVENOUS
  Filled 2012-10-20: qty 1

## 2012-10-20 MED ORDER — SODIUM CHLORIDE 0.9 % IJ SOLN
3.0000 mL | Freq: Two times a day (BID) | INTRAMUSCULAR | Status: DC
  Administered 2012-10-21 – 2012-10-23 (×5): 3 mL via INTRAVENOUS

## 2012-10-20 MED ORDER — HYDROMORPHONE HCL PF 1 MG/ML IJ SOLN
0.2500 mg | INTRAMUSCULAR | Status: DC | PRN
  Administered 2012-10-20 (×2): 0.5 mg via INTRAVENOUS

## 2012-10-20 MED ORDER — THROMBIN 5000 UNITS EX SOLR
OROMUCOSAL | Status: DC | PRN
  Administered 2012-10-20: 15:00:00 via TOPICAL

## 2012-10-20 MED ORDER — LIDOCAINE HCL (CARDIAC) 20 MG/ML IV SOLN
INTRAVENOUS | Status: DC | PRN
  Administered 2012-10-20: 20 mg via INTRAVENOUS

## 2012-10-20 MED ORDER — SODIUM CHLORIDE 0.9 % IJ SOLN: 3.0000 mL | INTRAMUSCULAR | Status: DC | PRN

## 2012-10-20 MED ORDER — BENAZEPRIL HCL 20 MG PO TABS
20.0000 mg | ORAL_TABLET | Freq: Every day | ORAL | Status: DC
  Administered 2012-10-21 – 2012-10-24 (×4): 20 mg via ORAL
  Filled 2012-10-20 (×5): qty 1

## 2012-10-20 MED ORDER — FENTANYL CITRATE 0.05 MG/ML IJ SOLN
INTRAMUSCULAR | Status: DC | PRN
  Administered 2012-10-20: 100 ug via INTRAVENOUS
  Administered 2012-10-20: 150 ug via INTRAVENOUS

## 2012-10-20 MED ORDER — HYDROCHLOROTHIAZIDE 25 MG PO TABS
25.0000 mg | ORAL_TABLET | Freq: Every day | ORAL | Status: DC
  Administered 2012-10-21 – 2012-10-24 (×4): 25 mg via ORAL
  Filled 2012-10-20 (×5): qty 1

## 2012-10-20 MED ORDER — OXYCODONE HCL 5 MG/5ML PO SOLN: 5.0000 mg | Freq: Once | ORAL | Status: DC | PRN

## 2012-10-20 MED ORDER — ONDANSETRON HCL 4 MG/2ML IJ SOLN: 4.0000 mg | INTRAMUSCULAR | Status: DC | PRN

## 2012-10-20 MED ORDER — PROPOFOL 10 MG/ML IV BOLUS
INTRAVENOUS | Status: DC | PRN
  Administered 2012-10-20: 50 mg via INTRAVENOUS
  Administered 2012-10-20: 150 mg via INTRAVENOUS

## 2012-10-20 SURGICAL SUPPLY — 58 items
APL SKNCLS STERI-STRIP NONHPOA (GAUZE/BANDAGES/DRESSINGS) ×1
BAG DECANTER FOR FLEXI CONT (MISCELLANEOUS) ×2 IMPLANT
BENZOIN TINCTURE PRP APPL 2/3 (GAUZE/BANDAGES/DRESSINGS) ×2 IMPLANT
BLADE SURG ROTATE 9660 (MISCELLANEOUS) IMPLANT
BUR MATCHSTICK NEURO 3.0 LAGG (BURR) ×1 IMPLANT
CANISTER SUCTION 2500CC (MISCELLANEOUS) ×2 IMPLANT
CLOTH BEACON ORANGE TIMEOUT ST (SAFETY) ×2 IMPLANT
CONT SPEC 4OZ CLIKSEAL STRL BL (MISCELLANEOUS) ×2 IMPLANT
DRAPE C-ARM 42X72 X-RAY (DRAPES) ×4 IMPLANT
DRAPE LAPAROTOMY 100X72 PEDS (DRAPES) ×2 IMPLANT
DRAPE MICROSCOPE ZEISS OPMI (DRAPES) IMPLANT
DRAPE POUCH INSTRU U-SHP 10X18 (DRAPES) ×2 IMPLANT
DRESSING TELFA 8X3 (GAUZE/BANDAGES/DRESSINGS) ×2 IMPLANT
DRSG OPSITE 4X5.5 SM (GAUZE/BANDAGES/DRESSINGS) ×4 IMPLANT
DURAPREP 26ML APPLICATOR (WOUND CARE) ×2 IMPLANT
ELECT REM PT RETURN 9FT ADLT (ELECTROSURGICAL) ×2
ELECTRODE REM PT RTRN 9FT ADLT (ELECTROSURGICAL) ×1 IMPLANT
EVACUATOR 1/8 PVC DRAIN (DRAIN) ×1 IMPLANT
GAUZE SPONGE 4X4 16PLY XRAY LF (GAUZE/BANDAGES/DRESSINGS) IMPLANT
GLOVE BIO SURGEON STRL SZ8 (GLOVE) ×1 IMPLANT
GLOVE BIOGEL M 8.0 STRL (GLOVE) ×1 IMPLANT
GLOVE BIOGEL PI IND STRL 7.0 (GLOVE) IMPLANT
GLOVE BIOGEL PI INDICATOR 7.0 (GLOVE) ×1
GLOVE ECLIPSE 6.5 STRL STRAW (GLOVE) ×1 IMPLANT
GLOVE SURG SS PI 7.0 STRL IVOR (GLOVE) ×3 IMPLANT
GOWN BRE IMP SLV AUR LG STRL (GOWN DISPOSABLE) ×1 IMPLANT
GOWN BRE IMP SLV AUR XL STRL (GOWN DISPOSABLE) ×3 IMPLANT
GOWN STRL REIN 2XL LVL4 (GOWN DISPOSABLE) IMPLANT
HEMOSTAT POWDER KIT SURGIFOAM (HEMOSTASIS) ×1 IMPLANT
KIT BASIN OR (CUSTOM PROCEDURE TRAY) ×2 IMPLANT
KIT ROOM TURNOVER OR (KITS) ×2 IMPLANT
NDL SPNL 20GX3.5 QUINCKE YW (NEEDLE) IMPLANT
NEEDLE HYPO 22GX1.5 SAFETY (NEEDLE) ×2 IMPLANT
NEEDLE SPNL 20GX3.5 QUINCKE YW (NEEDLE) IMPLANT
NS IRRIG 1000ML POUR BTL (IV SOLUTION) ×2 IMPLANT
PACK LAMINECTOMY NEURO (CUSTOM PROCEDURE TRAY) ×2 IMPLANT
PAD ARMBOARD 7.5X6 YLW CONV (MISCELLANEOUS) ×3 IMPLANT
PIN MAYFIELD SKULL DISP (PIN) ×2 IMPLANT
PUTTY BONE DBX 5CC MIX (Putty) ×1 IMPLANT
ROD 120MM (Rod) ×2 IMPLANT
ROD SPNL 120X3.5XNS LF TI (Rod) IMPLANT
RUBBERBAND STERILE (MISCELLANEOUS) IMPLANT
SCREW MA MM 3.5X12 (Screw) ×4 IMPLANT
SCREW MA MM 3.5X14 (Screw) ×4 IMPLANT
SCREW SET THREADED (Screw) ×8 IMPLANT
SPONGE GAUZE 4X4 12PLY (GAUZE/BANDAGES/DRESSINGS) ×2 IMPLANT
SPONGE LAP 4X18 X RAY DECT (DISPOSABLE) IMPLANT
SPONGE SURGIFOAM ABS GEL 100 (HEMOSTASIS) ×2 IMPLANT
STRIP CLOSURE SKIN 1/2X4 (GAUZE/BANDAGES/DRESSINGS) ×2 IMPLANT
SUT VIC AB 0 CT1 18XCR BRD8 (SUTURE) ×1 IMPLANT
SUT VIC AB 0 CT1 8-18 (SUTURE) ×2
SUT VIC AB 2-0 CP2 18 (SUTURE) ×2 IMPLANT
SUT VIC AB 3-0 SH 8-18 (SUTURE) ×3 IMPLANT
SWABSTICK BENZOIN STERILE (MISCELLANEOUS) ×1 IMPLANT
SYR 20ML ECCENTRIC (SYRINGE) ×2 IMPLANT
TOWEL OR 17X24 6PK STRL BLUE (TOWEL DISPOSABLE) ×2 IMPLANT
TOWEL OR 17X26 10 PK STRL BLUE (TOWEL DISPOSABLE) ×2 IMPLANT
WATER STERILE IRR 1000ML POUR (IV SOLUTION) ×2 IMPLANT

## 2012-10-20 NOTE — Progress Notes (Signed)
PT. VOIDING 150 CC OF URINE AT 2100 AND THEN AMBULATED IN HALL. PT. DENIED FEELING THE NEED TO VOID ANYMORE. POST RESIDUAL BLADDER SCAN SHOWED 794 ML IN BLADDER.  FOLEY PLACED AT 2115 AND 600 CC OF CLEAR YELLOW URINE OUTPUT. PT. TOLERATED WELL.  CHRIS Yahayra Geis RN

## 2012-10-20 NOTE — Plan of Care (Signed)
Problem: Consults Goal: Diagnosis - Spinal Surgery Outcome: Completed/Met Date Met:  10/20/12 Cervical Spine Fusion     

## 2012-10-20 NOTE — Anesthesia Postprocedure Evaluation (Signed)
  Anesthesia Post-op Note  Patient: QUINTELL BONNIN  Procedure(s) Performed: Procedure(s) with comments: C/5-6,C/6-7 [place]/Multi level,POSTERIOR CERVICAL FUSION/FORAMINOTOMY C/4-7 (Left) - Cervical Five-Six, Cervical Six-Seven Laminectomies, Posterior Cervical Fusion/Foraminotomies Cervical Four through Seven.   Patient Location: PACU  Anesthesia Type:General  Level of Consciousness: sedated and patient cooperative, responsive to voice   Airway and Oxygen Therapy: Patient Spontanous Breathing and Patient connected to nasal cannula oxygen  Post-op Pain: mild  Post-op Assessment: Post-op Vital signs reviewed, Patient's Cardiovascular Status Stable, Respiratory Function Stable, Patent Airway, No signs of Nausea or vomiting and Pain level controlled  Post-op Vital Signs: Reviewed and stable  Complications: No apparent anesthesia complications

## 2012-10-20 NOTE — Preoperative (Signed)
Beta Blockers   Reason not to administer Beta Blockers:Not Applicable 

## 2012-10-20 NOTE — Anesthesia Procedure Notes (Signed)
Procedure Name: Intubation Date/Time: 10/20/2012 1:30 PM Performed by: Elon Alas Pre-anesthesia Checklist: Patient identified, Emergency Drugs available, Suction available, Patient being monitored and Timeout performed Patient Re-evaluated:Patient Re-evaluated prior to inductionOxygen Delivery Method: Circle system utilized Preoxygenation: Pre-oxygenation with 100% oxygen Intubation Type: IV induction Ventilation: Mask ventilation without difficulty Grade View: Grade I Tube type: Oral Tube size: 7.5 mm Number of attempts: 1 Airway Equipment and Method: Stylet and Video-laryngoscopy Placement Confirmation: positive ETCO2,  breath sounds checked- equal and bilateral and ETT inserted through vocal cords under direct vision Secured at: 24 cm Tube secured with: Tape Dental Injury: Teeth and Oropharynx as per pre-operative assessment  Comments: Head and neck neutral, pt remained in c-collar for intubation

## 2012-10-20 NOTE — Anesthesia Preprocedure Evaluation (Addendum)
Anesthesia Evaluation  Patient identified by MRN, date of birth, ID band Patient awake    Reviewed: Allergy & Precautions, H&P , NPO status , Patient's Chart, lab work & pertinent test results  History of Anesthesia Complications Negative for: history of anesthetic complications  Airway Mallampati: II TM Distance: >3 FB Neck ROM: Full    Dental  (+) Dental Advisory Given and Teeth Intact   Pulmonary former smoker,  breath sounds clear to auscultation  Pulmonary exam normal       Cardiovascular hypertension, Pt. on medications Rhythm:Regular Rate:Normal     Neuro/Psych    GI/Hepatic negative GI ROS, Neg liver ROS,   Endo/Other  diabetes (glu 135), Well Controlled, Type 2, Insulin Dependent and Oral Hypoglycemic Agents  Renal/GU negative Renal ROS     Musculoskeletal   Abdominal   Peds  Hematology   Anesthesia Other Findings   Reproductive/Obstetrics                          Anesthesia Physical Anesthesia Plan  ASA: II  Anesthesia Plan: General   Post-op Pain Management:    Induction: Intravenous  Airway Management Planned: Oral ETT and Video Laryngoscope Planned  Additional Equipment:   Intra-op Plan:   Post-operative Plan: Extubation in OR  Informed Consent: I have reviewed the patients History and Physical, chart, labs and discussed the procedure including the risks, benefits and alternatives for the proposed anesthesia with the patient or authorized representative who has indicated his/her understanding and acceptance.   Dental advisory given  Plan Discussed with: Anesthesiologist, Surgeon and CRNA  Anesthesia Plan Comments: (Plan routine monitors, GETA with VideoGlide intubation )       Anesthesia Quick Evaluation

## 2012-10-20 NOTE — Transfer of Care (Signed)
Immediate Anesthesia Transfer of Care Note  Patient: Kenneth [name]  Procedure(s) Performed: Procedure(s) with comments: C/5-6,C/6-7 Lami/Multi level,POSTERIOR CERVICAL FUSION/FORAMINOTOMY C/4-7 (Left) - Cervical Five-Six, Cervical Six-Seven Laminectomies, Posterior Cervical Fusion/Foraminotomies Cervical Four through Seven.   Patient Location: PACU  Anesthesia Type:General  Level of Consciousness: awake, alert  and oriented  Airway & Oxygen Therapy: Patient connected to face mask  Post-op Assessment: Report given to PACU RN  Post vital signs: stable  Complications: No apparent anesthesia complications

## 2012-10-20 NOTE — H&P (Signed)
Subjective:   Patient is a 62 y.o. male admitted for posterior cervical fusion C4-C7. Patient underwent ACDF about 3 weeks ago at C5-6 and C6-7 . Resolution of his right radicular pain but about 3 days after surgery had the onset of left radicular pain. Serial plain films or the next 2 weeks showed progressive subsidence of the C5-6 bone graft and finally fracture through the C5 vertebral body. This was confirmed on CT scan. The patient first presented to me with complaints of neck pain and arm pain. Onset of symptoms was 2 weeks ago. The pain is described as sharp and stabbing and occurs intermittently. The pain is rated severe, and is located at the base of the neck and radiates to the left upper extremity. The symptoms have been progressive. Symptoms are exacerbated by extending head backwards, and are relieved by aspirin collar.  Previous work up includes CT scan of cervical spine and plain films.  Past Medical History  Diagnosis Date  . Diabetes mellitus   . Hyperlipidemia   . Hypertension     Past Surgical History  Procedure Laterality Date  . Colonoscopy w/ polypectomy  04/03/2010    4 polyps 8-70mm, worst TV adenoma with high-grade dysplasia  . Colonoscopy  09/30/2010    diverticulosis, small internal hemorrhoids  . No past surgeries    . Anterior cervical decomp/discectomy fusion N/A 10/01/2012    Procedure: ANTERIOR CERVICAL DECOMPRESSION/DISCECTOMY FUSION 2 LEVELS;  Surgeon: Tia Alert, MD;  Location: [facility];  Service: Neurosurgery;  Laterality: N/A;  Cervical five-six,Cervical six-seven  . Tonsillectomy      Allergies  Allergen Reactions  . Penicillins     As child/mother is allergic/pt does not take    History  Substance Use Topics  . Smoking status: Former Smoker -- 2 years    Quit date: 09/22/1969  . Smokeless tobacco: Not on file  . Alcohol Use: No    Family History  Problem Relation Age of Onset  . Diabetes Mother    Prior to Admission medications    Medication Sig Start Date End Date Taking? Authorizing Provider  aspirin 81 MG tablet Take 81 mg by mouth daily.     Yes Historical Provider, MD  atorvastatin (LIPITOR) 10 MG tablet Take 10 mg by mouth daily.   Yes Historical Provider, MD  benazepril-hydrochlorthiazide (LOTENSIN HCT) 20-25 MG per tablet Take 1 tablet by mouth daily.     Yes Historical Provider, MD  calcium carbonate (OS-CAL) 600 MG TABS Take 600 mg by mouth 2 (two) times daily with a meal.     Yes Historical Provider, MD  diazepam (VALIUM) 5 MG tablet Take 5 mg by mouth every 8 (eight) hours as needed for anxiety. For anxiety   Yes Historical Provider, MD  felodipine (PLENDIL) 5 MG 24 hr tablet Take 5 mg by mouth daily.    Yes Historical Provider, MD  insulin detemir (LEVEMIR) 100 UNIT/ML injection Inject 10 Units into the skin every morning.   Yes Historical Provider, MD  insulin lispro (HUMALOG) 100 UNIT/ML injection Inject 5 Units into the skin 3 (three) times daily before meals.   Yes Historical Provider, MD  metFORMIN (GLUCOPHAGE) 1000 MG tablet Take 1,000 mg by mouth 2 (two) times daily with a meal.     Yes Historical Provider, MD  oxyCODONE-acetaminophen (PERCOCET) 10-325 MG per tablet Take 2 tablets by mouth every 6 (six) hours as needed for pain. For pain   Yes Historical Provider, MD  Review of Systems  Positive ROS: neg  All other systems have been reviewed and were otherwise negative with the exception of those mentioned in the HPI and as above.  Objective: Vital signs in last 24 hours: Temp:  [98.5 F (36.9 C)] 98.5 F (36.9 C) (06/11 1206) Pulse Rate:  [56] 56 (06/11 1206) Resp:  [20] 20 (06/11 1206) BP: (147)/(55) 147/55 mmHg (06/11 1206) SpO2:  [97 %] 97 % (06/11 1206) Weight:  [86.183 kg (190 lb)] 86.183 kg (190 lb) (06/10 2001)  General Appearance: Alert, cooperative, no distress, appears stated age Head: Normocephalic, without obvious abnormality, atraumatic Eyes: PERRL, conjunctiva/corneas  clear, EOM's intact, fundi benign, both eyes      Ears: Normal TM's and external ear canals, both ears Throat: Lips, mucosa, and tongue normal; teeth and gums normal Neck: Supple, symmetrical, trachea midline, no adenopathy; thyroid: No enlargement/tenderness/nodules; no carotid bruit or JVD Back: Symmetric, no curvature, ROM normal, no CVA tenderness Lungs: Clear to auscultation bilaterally, respirations unlabored Heart: Regular rate and rhythm, S1 and S2 normal, no murmur, rub or gallop Abdomen: Soft, non-tender, bowel sounds active all four quadrants, no masses, no organomegaly Extremities: Extremities normal, atraumatic, no cyanosis or edema Pulses: 2+ and symmetric all extremities Skin: Skin color, texture, turgor normal, no rashes or lesions  NEUROLOGIC:  Mental status: Alert and oriented x4, no aphasia, good attention span, fund of knowledge and memory  Motor Exam - grossly normal Sensory Exam - grossly normal Reflexes: 1+ Coordination - grossly normal Gait - grossly normal Balance - grossly normal Cranial Nerves: I: smell Not tested  II: visual acuity  OS: nl    OD: nl  II: visual fields Full to confrontation  II: pupils Equal, round, reactive to light  III,VII: ptosis None  III,IV,VI: extraocular muscles  Full ROM  V: mastication Normal  V: facial light touch sensation  Normal  V,VII: corneal reflex  Present  VII: facial muscle function - upper  Normal  VII: facial muscle function - lower Normal  VIII: hearing Not tested  IX: soft palate elevation  Normal  IX,X: gag reflex Present  XI: trapezius strength  5/5  XI: sternocleidomastoid strength 5/5  XI: neck flexion strength  5/5  XII: tongue strength  Normal    Data Review Lab Results  Component Value Date   WBC 16.7* 09/30/2012   HGB 14.7 09/30/2012   HCT 43.0 09/30/2012   MCV 87.8 09/30/2012   PLT 297 09/30/2012   Lab Results  Component Value Date   NA 136 09/30/2012   K 4.1 09/30/2012   CL 96 09/30/2012    CO2 28 09/30/2012   BUN 24* 09/30/2012   CREATININE 0.84 09/30/2012   GLUCOSE 191* 09/30/2012   No results found for this basename: INR, PROTIME    Assessment:   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 Patient has failed conservative therapy. Planned surgery : Posterior cervical fusion C4-C7 with possible foraminotomies at C5-6 and C6-7 on the left.  Plan:   I explained the condition and procedure to the patient and answered any questions.  Patient wishes to proceed with procedure as planned. Understands risks/ benefits/ and expected or typical outcomes.  Kenneth [name] S 10/20/2012 1:06 PM

## 2012-10-20 NOTE — Op Note (Signed)
10/20/2012  3:44 PM  PATIENT:  Kenneth [name]  62 y.o. male  PRE-OPERATIVE DIAGNOSIS:  Failed construct with C5 fracture after her C5-6 C6-7 ACDF, neck and left arm pain  POST-OPERATIVE DIAGNOSIS:  Same  PROCEDURE:  1. Posterior cervical fusion C4-C7 utilizing local autograft and morcellized allograft, 2. Posterior cervical decompressive laminectomy and foraminotomies C5-6 and C6-7 on the left, 3. Fixation C4-C7 bilaterally utilizing Nuvasive lateral mass screws  SURGEON:  Marikay Alar, MD  ASSISTANTS: Dr. Franky Macho  ANESTHESIA:   General  EBL: Less than 100 ml  Total I/O In: 1600 [I.V.:1600] Out: -   BLOOD ADMINISTERED:none  DRAINS: Medium Hemovac   SPECIMEN:  No Specimen  INDICATION FOR PROCEDURE: This pt presented with neck and left arm pain after ACDF C5-6 C6-7. Plain films and CT scan showed subsidence of the interbody graft at C5-6 and fracture the C5 vertebral body with some kyphosis. Recommended posterior cervical stabilization and fixation. Consideration was given to carrying the fusion down to T1 but we felt this is likely necessary. Patient understood the risks, benefits, and alternatives and potential outcomes and wished to proceed.  PROCEDURE DETAILS: The patient was brought to the operating room. Generalized endotracheal anesthesia was induced. The patient was affixed a 3 point [place] headrest and rolled into the prone position on chest rolls. All pressure points were padded. The posterior cervical region was cleaned and prepped with DuraPrep and then draped in the usual sterile fashion. 7 cc of local anesthesia was injected and a dorsal midline incision made in the posterior cervical region and carried down to the cervical fascia. The fascia was opened and the paraspinous musculature was taken down to expose C4-C7 bilaterally. Intraoperative fluoroscopy confirmed my level and then the dissection was carried out over the lateral facets. I localized the midpoint of each  lateral mass and marked a region 1 mm medial to the midpoint of the lateral mass, and then drilled in an upper and outward direction into the safe zone of each lateral mass. I drilled to a depth of 14 mm and then checked my drill hole with a ball probe. I then placed a 14 mm lateral mass screws into the safe zone of each lateral mass of C4-C7 bilaterally until they were 2 fingers tight. I then decompressed the central canal on the left and the left-sided foramen at C5-6 and C6-7 with the 1 and 2 mm Kerrison punch. I performed foraminotomies at C6 and C7 nerve roots on the left, marching along the nerve root with the 1 and 2 mm Kerrison punch until I was distal to the pedicle at each level. The nerve hook we then passed easily along the nerve root. I then decorticated the lateral masses and the facet joints and packed them with local autograft and morcellized allograft to perform arthrodesis from C4-C7 bilaterally. I then placed rods into the multiaxial screw heads of the screws and locked these in position with the locking caps and anti-torque device. I then checked the final construct with fluoroscopy. I irrigated with saline solution containing bacitracin. A placed a medium Hemovac drain through separate stab incision, and lying the dura with Gelfoam. After hemostasis was achieved a closed the muscle and the fascia with 0 Vicryl, subcutaneous tissue with 2-0 Vicryl, and the subcuticular tissue with 3-0 Vicryl. The skin was closed with benzoin and Steri-Strips. A sterile dressing was applied, the patient was turned to the supine physician and taken out of the headrest, awakened from general anesthesia and transferred  to the recovery room in stable condition. At the end of the procedure all sponge, needle and instrument counts were correct.   PLAN OF CARE: Admit for overnight observation  PATIENT DISPOSITION:  PACU - hemodynamically stable.   Delay start of Pharmacological VTE agent (>24hrs) due to surgical  blood loss or risk of bleeding:  yes

## 2012-10-21 ENCOUNTER — Ambulatory Visit (HOSPITAL_COMMUNITY): Payer: BC Managed Care – PPO

## 2012-10-21 ENCOUNTER — Encounter (HOSPITAL_COMMUNITY): Payer: Self-pay | Admitting: Neurological Surgery

## 2012-10-21 LAB — GLUCOSE, CAPILLARY: Glucose-Capillary: 169 mg/dL — ABNORMAL HIGH (ref 70–99)

## 2012-10-21 MED ORDER — TAMSULOSIN HCL 0.4 MG PO CAPS
0.4000 mg | ORAL_CAPSULE | Freq: Every day | ORAL | Status: DC
  Administered 2012-10-21 – 2012-10-24 (×4): 0.4 mg via ORAL
  Filled 2012-10-21 (×4): qty 1

## 2012-10-21 NOTE — Progress Notes (Signed)
Patient ID: Kenneth [name], male   DOB: 1951-03-04, 62 y.o.   MRN: [id number] Really doing well other than some mild urinary retention. Neck pain is much better no arm pain or numbness pain weakness. He remains in a cervical collar. We'll continue Flomax and hopefully discharge home tomorrow.

## 2012-10-22 LAB — GLUCOSE, CAPILLARY
Glucose-Capillary: 133 mg/dL — ABNORMAL HIGH (ref 70–99)
Glucose-Capillary: 128 mg/dL — ABNORMAL HIGH (ref 70–99)

## 2012-10-22 MED ORDER — TAMSULOSIN HCL 0.4 MG PO CAPS: 0.4000 mg | ORAL_CAPSULE | Freq: Every day | ORAL | Status: DC

## 2012-10-22 MED ORDER — OXYCODONE-ACETAMINOPHEN 10-325 MG PO TABS: 2.0000 | ORAL_TABLET | Freq: Four times a day (QID) | ORAL | Status: DC | PRN

## 2012-10-22 NOTE — Discharge Summary (Signed)
Physician Discharge Summary  Patient ID: Kenneth [name] MRN: [id number] DOB/AGE: 1951-03-11 62 y.o.  Admit date: 10/20/2012 Discharge date: 10/22/2012  Admission Diagnoses: Failed cervical fusion    Discharge Diagnoses: Same   Discharged Condition: good  Hospital Course: The patient was admitted on 10/20/2012 and taken to the operating room where the patient underwent posterior cervical fusion. The patient tolerated the procedure well and was taken to the recovery room and then to the floor in stable condition. The hospital course was routine. There were no complications. The wound remained clean dry and intact. Pt had appropriate neck soreness. No complaints of arm pain or new N/T/W. The patient remained afebrile with stable vital signs, and tolerated a regular diet. The patient continued to increase activities in his cervical collar, and pain was well controlled with oral pain medications. He had some mild urinary retention but this seemed to resolve with Flomax.  Consults: None  Significant Diagnostic Studies:  Results for orders placed during the hospital encounter of 10/20/12  GLUCOSE, CAPILLARY      Result Value Range   Glucose-Capillary 135 (*) 70 - 99 mg/dL  BASIC METABOLIC PANEL      Result Value Range   Sodium 141  135 - 145 mEq/L   Potassium 4.1  3.5 - 5.1 mEq/L   Chloride 102  96 - 112 mEq/L   CO2 32  19 - 32 mEq/L   Glucose, Bld 135 (*) 70 - 99 mg/dL   BUN 18  6 - 23 mg/dL   Creatinine, Ser 4.09  0.50 - 1.35 mg/dL   Calcium 9.4  8.4 - 81.1 mg/dL   GFR calc non Af Amer >90  >90 mL/min   GFR calc Af Amer >90  >90 mL/min  CBC      Result Value Range   WBC 11.1 (*) 4.0 - 10.5 K/uL   RBC 4.20 (*) 4.22 - 5.81 MIL/uL   Hemoglobin 12.7 (*) 13.0 - 17.0 g/dL   HCT 91.4 (*) 78.2 - 95.6 %   MCV 87.1  78.0 - 100.0 fL   MCH 30.2  26.0 - 34.0 pg   MCHC 34.7  30.0 - 36.0 g/dL   RDW 21.3  08.6 - 57.8 %   Platelets 276  150 - 400 K/uL  GLUCOSE, CAPILLARY      Result Value  Range   Glucose-Capillary 138 (*) 70 - 99 mg/dL   Comment 1 Notify RN    GLUCOSE, CAPILLARY      Result Value Range   Glucose-Capillary 169 (*) 70 - 99 mg/dL  GLUCOSE, CAPILLARY      Result Value Range   Glucose-Capillary 289 (*) 70 - 99 mg/dL  GLUCOSE, CAPILLARY      Result Value Range   Glucose-Capillary 169 (*) 70 - 99 mg/dL   Comment 1 Notify RN     Comment 2 Documented in Chart    GLUCOSE, CAPILLARY      Result Value Range   Glucose-Capillary 148 (*) 70 - 99 mg/dL   Comment 1 Notify RN     Comment 2 Documented in Chart    GLUCOSE, CAPILLARY      Result Value Range   Glucose-Capillary 190 (*) 70 - 99 mg/dL   Comment 1 Notify RN     Comment 2 Documented in Chart    GLUCOSE, CAPILLARY      Result Value Range   Glucose-Capillary 148 (*) 70 - 99 mg/dL  GLUCOSE, CAPILLARY  Result Value Range   Glucose-Capillary 139 (*) 70 - 99 mg/dL   Comment 1 Notify RN     Comment 2 Documented in Chart    GLUCOSE, CAPILLARY      Result Value Range   Glucose-Capillary 133 (*) 70 - 99 mg/dL    Dg Chest 2 View  5/62/1308   *RADIOLOGY REPORT*  Clinical Data: Preop for cervical surgery  CHEST - 2 VIEW  Comparison: 10/18/2010  Findings: Cardiomediastinal silhouette is stable.  No acute infiltrate or pleural effusion.  No pulmonary edema.  Mild degenerative changes thoracic spine.  Mild hyperinflation.  IMPRESSION:  No active disease.  Mild hyperinflation again noted.   Original Report Authenticated By: Natasha Mead, M.D.   Dg Cervical Spine 2 Or 3 Views  10/21/2012   *RADIOLOGY REPORT*  Clinical Data: Evaluate fusion, postop.  CERVICAL SPINE - 2-3 VIEW  Comparison: 10/20/2012 and CT cervical spine 10/18/2012.  Findings: The cervical spine is visualized from the occiput to the cervicothoracic junction.  Interval posterior fusion from C4 to C7. Previously seen C5-7 anterior cervical fusion, at C5 fracture, and subsidence of the C5-6 and C6-7 interbody spacers are again noted.  There is  straightening of the normal cervical lordosis. Prevertebral soft tissues appear within normal limits.  Surgical drain is seen in the posterior soft tissues.  Visualized lung apices are clear.  IMPRESSION:  1.  Interval C4-7 posterior cervical fusion without complicating feature. 2.  Preexisting anterior C5-7 cervical fusion with C5 fracture and subsidence of interbody spacers, as on 10/18/2012.   Original Report Authenticated By: Leanna Battles, M.D.   Dg Cervical Spine 2-3 Views  10/20/2012   *RADIOLOGY REPORT*  Clinical Data: 62 year old male undergoing cervical spine surgery.  DG C-ARM 1-60 MIN, CERVICAL SPINE - 2-3 VIEW  Fluoroscopy time:  0 minutes 10 seconds.  Comparison: Cervical spine CT 10/18/2012.  Findings: Three intraoperative fluoroscopic views of the cervical spine.  Posterior laminar fusion hardware has been added to the preexisting C5 to C7 ACDF.  Laminar screws with connecting rods are in place from the C4 to the C7 level bilaterally.  IMPRESSION: Addition of bilateral C4 to C7 posterior fusion hardware.   Original Report Authenticated By: Erskine Speed, M.D.   Dg Cervical Spine 2-3 Views  10/01/2012   *RADIOLOGY REPORT*  Clinical Data: Cervical fusion  CERVICAL SPINE - 2-3 VIEW  Comparison: 09/28/2012  Findings: Anterior plate and screws are present at C5, C6, and C7 with interposed tubular bone plugs.  There is anatomic alignment through the top of C7.  No breakage or loosening of the hardware.  IMPRESSION: Anterior discectomy and fusion at C5, C6, and C7 without complication.   Original Report Authenticated By: Jolaine Click, M.D.   Ct Cervical Spine Wo Contrast  10/19/2012   *RADIOLOGY REPORT*  Clinical Data: Cervical fracture.  Pain.  Parasthesias in the finger.  CT CERVICAL SPINE WITHOUT CONTRAST  Technique:  Multidetector CT imaging of the cervical spine was performed. Multiplanar CT image reconstructions were also generated.  Comparison: MRI of the cervical spine 10/07/2012.  CT of the  cervical spine 09/28/2012.  Findings: The cervical spine is imaged from the skull base through T1-2.  The patient is status post ACDF at C5-6 and C6-7.  A transverse fracture is present in the anterior aspect of the C5. A sagittally oriented lucency communicates with the transverse fracture.  This is likely the vertebral vein.  The transverse fracture is just above the level of the C5 screws.  There is significant subsidence of the C5-6 disc spacer into the inferior endplate of C5, such that the C5 screws are resting on the disc spacer.  There is some subsidence of the C6-7 disc spacer anteriorly into the inferior endplate of C6.  This spacer is in contact with the C6 screws.  The C7 screws are intact.  No additional fractures are present in the C6 or C7 vertebral body.  C2-3: Negative.  C3-4:  Thin mild disc osteophyte complex is present.  Minimal foraminal narrowing is worse on the left.  C4-5:  A broad-based disc osteophyte complex is present. Asymmetric left-sided uncovertebral spurring is evident.  Mild left foraminal narrowing is stable.  C5-6:  Asymmetric left-sided uncovertebral spurring is present with mild to moderate left foraminal stenosis.  C6-7:  No residual osseous stenosis is evident.  C7-T1:  Mild facet hypertrophy is present.  There is no significant stenosis.  IMPRESSION:  1.  Transverse fracture in the anterior superior endplate of C5 extending inferiorly to the level of the C5 screws. 2.  Subsidence of the C5-6 disc spacer into the inferior endplate of C5.  The disc spacer is in contact with the C5 screws. 3.  Anterior subsidence of the C6-7 disc spacer into the inferior endplate of C6 with contact of the C6 screws.  4.  Residual left foraminal stenosis at C5-6. 5.  Mild foraminal narrowing bilaterally at C3-4 and C4-5 is stable, as described above.  These results were called by telephone on 10/19/2012 at 8 o'clock a.m. to Dr. Yetta Barre, who verbally acknowledged these results.   Original Report  Authenticated By: Marin Roberts, M.D.   Ct Cervical Spine Wo Contrast  09/28/2012   *RADIOLOGY REPORT*  Clinical Data: Severe right shoulder and arm pain for 2 weeks. Right index finger numbness.  No known injury. The patient reportedly attempted MRI but was unable to complete due to pain.  CT CERVICAL SPINE WITHOUT CONTRAST  Technique:  Multidetector CT imaging of the cervical spine was performed. Multiplanar CT image reconstructions were also generated.  Comparison: None.  Findings: The cervical alignment is normal.  There is no evidence of acute fracture or subluxation.  No paraspinal abnormalities are identified.  There is a low density 1.7 x 1.4 cm nodule projecting posteriorly from the inferior aspect of the right thyroid lobe.  The lung apices are clear.  C2-C3:  Normal interspace.  C3-C4:  Shallow left paracentral disc protrusion mildly narrows the AP diameter of the canal to 9 mm.  There is no significant foraminal stenosis.  C4-C5:  There is disc bulging with left greater than right uncinate spurring contributing to mild left foraminal stenosis.  The AP diameter of the canal is 9 mm.  C5-C6:  There is spondylosis with posterior osteophytes and uncinate spurring bilaterally.  Mild osseous foraminal stenosis is present bilaterally.  In addition, the perineural fat within the right foramen appears partially obscured.  The AP diameter of the canal is approximately 7 mm.  C6-C7:  There is a shallow left paracentral disc protrusion.  Mild uncinate spurring is present without cord deformity or significant foraminal compromise.  C7-T1:  Bilateral facet hypertrophy, worse on the left.  No significant spinal stenosis identified.  IMPRESSION:  1.  Spondylosis and small disc protrusions contribute to mild to moderate central stenosis as described, greatest at C5-C6. 2.  No high-grade osseous foraminal stenosis is demonstrated. There is mild osseous foraminal stenosis bilaterally at C5-C6 with a possible  superimposed right foraminal disc protrusion.  This is not optimally evaluated by routine CT. 3.  If the patient has persistent radicular symptoms, further evaluation with CT myelography or repeat attempted MRI should be considered. 4.  Exophytic right thyroid or parathyroid nodule. Consider further evaluation with thyroid ultrasound.  If patient is clinically hyperthyroid, consider nuclear medicine thyroid uptake and scan.   Original Report Authenticated By: Carey Bullocks, M.D.   Mr Cervical Spine Wo Contrast  10/08/2012   *RADIOLOGY REPORT*  Clinical Data: Cervical surgery 10/01/2012.  Left upper extremity pain.  Right first digit numbness.  MRI CERVICAL SPINE WITHOUT CONTRAST  Technique:  Multiplanar and multiecho pulse sequences of the cervical spine, to include the craniocervical junction and cervicothoracic junction, were obtained according to standard protocol without intravenous contrast.  Comparison: CT 09/28/2012.  Intraoperative localization 10/01/2012.  Findings: Study is technically degraded because the patient was unable to tolerate standard coil.  Nonstandard positioning at the medial employed.  Reportedly, the patient has been unable to completed MRI at other facilities.  The study is diagnostic.  Reversal of the normal cervical lordosis is present.  C5-C7 ACDF. Allowing for technical degradation of the study which is mild, the cervical cord shows normal signal.  No intramedullary lesions.  No cord compression or hematoma.  There are flow voids present in both vertebral arteries. Prevertebral edema/phlegmon is present, most compatible with recent operation.  Although infection cannot be excluded, these findings are expected after an operation.  C2-C3:  Negative.  C3-C4:  Shallow broad-based disc bulging is present.  Mild bilateral uncovertebral spurring with mild left foraminal encroachment.  Central canal appears patent.  C4-C5:  Disc degeneration.  Left eccentric broad-based disc bulging with  mild central stenosis. Disc osteophyte complex just contacts the left ventral aspect of the cervical cord with flattening of the left ventral cord.  Disc and osteophyte extends into the left neural foramen, producing foraminal encroachment that potentially affects the left C5 nerve.  The right neural foramen appears patent.  C5-C6:  ACDF is present.  Foramina and central canal appear adequately patent allowing for artifact.  C6-C7:  ACDF.  Foramina and central canal appear adequately patent allowing for artifact.  C7-T1:  Mild disc degeneration.  No stenosis.  IMPRESSION:  1.  C5-C7 ACDF without complicating features.  No recurrent stenosis. 2. C4-C5 left eccentric disc osteophyte complex with mild central stenosis and left foraminal encroachment potentially affecting the left C5 nerve. 3.  Left C3-C4 foraminal stenosis potentially affecting the left C4 nerve. 4.  No cord hematoma, contusion or compression in this patient following recent ACDF.   Original Report Authenticated By: Andreas Newport, M.D.   Dg C-arm 1-60 Min  10/20/2012   *RADIOLOGY REPORT*  Clinical Data: 62 year old male undergoing cervical spine surgery.  DG C-ARM 1-60 MIN, CERVICAL SPINE - 2-3 VIEW  Fluoroscopy time:  0 minutes 10 seconds.  Comparison: Cervical spine CT 10/18/2012.  Findings: Three intraoperative fluoroscopic views of the cervical spine.  Posterior laminar fusion hardware has been added to the preexisting C5 to C7 ACDF.  Laminar screws with connecting rods are in place from the C4 to the C7 level bilaterally.  IMPRESSION: Addition of bilateral C4 to C7 posterior fusion hardware.   Original Report Authenticated By: Erskine Speed, M.D.   Dg C-arm 1-60 Min  10/01/2012   Please refer to accession number [id number].   Original Report Authenticated By: Jolaine Click, M.D.    Antibiotics:  Anti-infectives   Start     Dose/Rate Route Frequency Ordered Stop  10/20/12 1435  bacitracin 50,000 Units in sodium chloride irrigation 0.9 % 500  mL irrigation  Status:  Discontinued       As needed 10/20/12 1435 10/20/12 1540   10/20/12 1315  bacitracin 40981 UNITS injection    Comments:  KEY, JENNIFER: cabinet override      10/20/12 1315 10/21/12 0114   10/20/12 1314  vancomycin (VANCOCIN) 1 GM/200ML IVPB    Comments:  LEE, HEATHER: cabinet override      10/20/12 1314 10/20/12 1335      Discharge Exam: Blood pressure 144/68, pulse 57, temperature 98.9 F (37.2 C), temperature source Oral, resp. rate 16, height 5\' 11"  (1.803 m), weight 86.183 kg (190 lb), SpO2 95.00%. Neurologic: Grossly normal Incision clean and  Discharge Medications:     Medication List    TAKE these medications       aspirin 81 MG tablet  Take 81 mg by mouth daily.     atorvastatin 10 MG tablet  Commonly known as:  LIPITOR  Take 10 mg by mouth daily.     benazepril-hydrochlorthiazide 20-25 MG per tablet  Commonly known as:  LOTENSIN HCT  Take 1 tablet by mouth daily.     calcium carbonate 600 MG Tabs  Commonly known as:  OS-CAL  Take 600 mg by mouth 2 (two) times daily with a meal.     diazepam 5 MG tablet  Commonly known as:  VALIUM  Take 5 mg by mouth every 8 (eight) hours as needed for anxiety. For anxiety     felodipine 5 MG 24 hr tablet  Commonly known as:  PLENDIL  Take 5 mg by mouth daily.     insulin detemir 100 UNIT/ML injection  Commonly known as:  LEVEMIR  Inject 10 Units into the skin every morning.     insulin lispro 100 UNIT/ML injection  Commonly known as:  HUMALOG  Inject 5 Units into the skin 3 (three) times daily before meals.     metFORMIN 1000 MG tablet  Commonly known as:  GLUCOPHAGE  Take 1,000 mg by mouth 2 (two) times daily with a meal.     oxyCODONE-acetaminophen 10-325 MG per tablet  Commonly known as:  PERCOCET  Take 2 tablets by mouth every 6 (six) hours as needed for pain. For pain     tamsulosin 0.4 MG Caps  Commonly known as:  FLOMAX  Take 1 capsule (0.4 mg total) by mouth daily.         Disposition: home   Final Dx: Posterior cervical fusion      Discharge Orders   Future Orders Complete By Expires     Call MD for:  difficulty breathing, headache or visual disturbances  As directed     Call MD for:  persistant nausea and vomiting  As directed     Call MD for:  redness, tenderness, or signs of infection (pain, swelling, redness, odor or green/yellow discharge around incision site)  As directed     Call MD for:  severe uncontrolled pain  As directed     Call MD for:  temperature >100.4  As directed     Diet - low sodium heart healthy  As directed     Discharge instructions  As directed     Comments:      No heavy lifting or driving    Increase activity slowly  As directed           Signed: Warden Buffa S 10/22/2012, 3:22 PM

## 2012-10-22 NOTE — Progress Notes (Signed)
Pt was ambulating in the hallway with NT and patients wife. Pt started c/o severe pain and leaned against the wall. At this time NT called RN to assist with Pt. Pt started sliding down the wall saying "it hurts so bad". RN and NT lowered Pt to the floor. Pt assessed at this time, no injury occurred, Pt's vitals were stable. Pt is alert and talking but c/o pain in his neck and back. Pt assisted up to and upright position and then stood and walked with assist back to his room. Dr. Venetia Maxon notified and no orders given. Joni Reining, DD notified. RN will continue to monitor Pt. Rema Fendt, RN

## 2012-10-22 NOTE — Progress Notes (Signed)
Patient ID: Kenneth [name], male   DOB: 12-14-1950, 62 y.o.   MRN: [id number] Having a little more neck soreness today which is to be expected on postop day 2. No arm pain or numbness tingling or weakness. Neurologic exam is normal. Still having urinary retention. Offered to discharge home with a leg bag but he wants to stay in try one more day. Continue Flomax. Continue pain control. X-rays yesterday looked good with stable alignment.

## 2012-10-23 LAB — GLUCOSE, CAPILLARY: Glucose-Capillary: 228 mg/dL — ABNORMAL HIGH (ref 70–99)

## 2012-10-23 NOTE — Progress Notes (Signed)
Physical Therapy Evaluation Patient Details Name: Kenneth [name] MRN: [id number] DOB: [birth year] Today's Date: 10/23/2012 Time: 3244-0102 PT Time Calculation (min): 25 min  PT Assessment / Plan / Recommendation Clinical Impression  Pt s/p C4-7 fusion with decr mobility secondary to decr endurnce and decr balance.  Will benefit from PT to address endurance and balance issues.  MD:  Pt with significant pain and lethargy that are limiting patient's ability to mobilize safely.  Spoke with nursing re: ? need to change meds or maybe ask for a pain management consult.  At present, do not see how his wife can care for him at home alone until pain is controlled.      PT Assessment  Patient needs continued PT services    Follow Up Recommendations  Home health PT;Supervision/Assistance - 24 hour (once pain under control)                Equipment Recommendations  Rolling walker with 5" wheels         Frequency Min 5X/week    Precautions / Restrictions Precautions Precautions: Fall Required Braces or Orthoses: Cervical Brace Cervical Brace: Hard collar;Applied in supine position Restrictions Weight Bearing Restrictions: No   Pertinent Vitals/Pain VSS, 7/10 neck pain      Mobility  Bed Mobility Bed Mobility: Rolling Right;Right Sidelying to Sit Rolling Right: 4: Min assist Right Sidelying to Sit: 4: Min assist Details for Bed Mobility Assistance: cues for sequence and technique Transfers Transfers: Sit to Stand;Stand to Sit Sit to Stand: 1: +2 Total assist;With upper extremity assist;From bed Sit to Stand: Patient Percentage: 60% Stand to Sit: 1: +2 Total assist;With armrests;With upper extremity assist;To chair/3-in-1 Stand to Sit: Patient Percentage: 70% Details for Transfer Assistance: needed cues for hand placement.  Pt with poor safety with transitional movements.   Ambulation/Gait Ambulation/Gait Assistance: 3: Mod assist;4: Min assist Ambulation Distance (Feet): 150  Feet Assistive device: Rolling walker Ambulation/Gait Assistance Details: Pt needed a lot of cues and assist for ambulation.  Poor safety awareness overall with pt ambulating with RW and taking hands off of RW when walking as well as pt gets distracted easliy and loses balance.  Pt kept stopping while ambulation and would arch back and lose balance posteriorly.  Pt also would close eyes and kept needing cues to keep his eyes open.   Gait Pattern: Step-to pattern;Narrow base of support;Decreased stride length Gait velocity: decreased Stairs: No Wheelchair Mobility Wheelchair Mobility: No         PT Diagnosis: Generalized weakness  PT Problem List: Decreased activity tolerance;Decreased balance;Decreased mobility;Decreased knowledge of use of DME;Decreased safety awareness;Decreased knowledge of precautions;Pain PT Treatment Interventions: DME instruction;Gait training;Functional mobility training;Therapeutic activities;Stair training;Therapeutic exercise;Balance training;Patient/family education   PT Goals Acute Rehab PT Goals PT Goal Formulation: With patient Time For Goal Achievement: 10/30/12 Potential to Achieve Goals: Good Pt will go Supine/Side to Sit: with rail;with supervision PT Goal: Supine/Side to Sit - Progress: Goal set today Pt will go Sit to Stand: with modified independence;with upper extremity assist PT Goal: Sit to Stand - Progress: Goal set today Pt will Ambulate: 51 - 150 feet;with modified independence;with least restrictive assistive device PT Goal: Ambulate - Progress: Goal set today Pt will Go Up / Down Stairs: 3-5 stairs;with modified independence;with least restrictive assistive device PT Goal: Up/Down Stairs - Progress: Goal set today  Visit Information  Last PT Received On: 10/23/12 Assistance Needed: +2    Subjective Data  Subjective: "I am scared to fall." Patient Stated  Goal: to go home   Prior Functioning  Home Living Lives With: Spouse Available  Help at Discharge: Family;Available 24 hours/day Type of Home: House Home Access: Stairs to enter [employer] of Steps: 2 Entrance Stairs-Rails: Can reach both Home Layout: One level Bathroom Shower/Tub: Health visitor: Standard Home Adaptive Equipment: Bedside commode/3-in-1;Shower chair with back;Crutches;Walker - rolling Additional Comments: Per pt and wife, pt swam 1 mile a day and ran 5 miles a day prior to admit. 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 Communication: No difficulties    Cognition  Cognition Arousal/Alertness: Lethargic Behavior During Therapy: Flat affect Overall Cognitive Status: Impaired/Different from baseline Area of Impairment: Attention;Memory;Safety/judgement;Awareness;Problem solving Current Attention Level: Focused Memory: Decreased recall of precautions Safety/Judgement: Decreased awareness of safety;Decreased awareness of deficits Awareness: Intellectual Problem Solving: Slow processing;Difficulty sequencing;Requires verbal cues;Requires tactile cues    Extremity/Trunk Assessment Right Lower Extremity Assessment RLE ROM/Strength/Tone: [facility] for tasks assessed Left Lower Extremity Assessment LLE ROM/Strength/Tone: WFL for tasks assessed   Balance Static Standing Balance Static Standing - Balance Support: Bilateral upper extremity supported;During functional activity Static Standing - Level of Assistance: 4: Min assist;3: Mod assist Static Standing - Comment/# of Minutes: 2  End of Session PT - End of Session Equipment Utilized During Treatment: Gait belt Activity Tolerance: Patient limited by fatigue;Patient limited by pain Patient left: in chair;with call bell/phone within reach;with family/visitor present Nurse Communication: Mobility status  GP Functional Assessment Tool Used: clinical judgment Functional  Limitation: Mobility: Walking and moving around Mobility: Walking and Moving Around Current Status [phone]): At least 20 percent but less than 40 percent impaired, limited or restricted Mobility: Walking and Moving Around Goal Status [phone]): At least 1 percent but less than 20 percent impaired, limited or restricted   INGOLD,Adison Reifsteck 10/23/2012, 4:38 PM [facility] [phone] (pager)

## 2012-10-23 NOTE — Progress Notes (Signed)
Patient arrived to floor per wc from [address].  Patient is alert and oriented; wife present at side upon arrival.  Oriented to room.  Able to make transfer from wc to bed with min standby assist with nurse.  Collar on patient; instructed patient to keep collar on at all times.  Off going nurse showed how patient's neck was edematous above dressing.  Explained our protocol of bed alarm for safety for first 24 hours of patient arriving on our floor.  Patient made comfortable in bed.  Report received from Garey Ham., RN, offgoing nurse from [address].  Care assumed by this nurse.

## 2012-10-23 NOTE — Progress Notes (Signed)
Subjective: Patient reports still weak.  Needs help with walking.  Objective: Vital signs in last 24 hours: Temp:  [98.2 F (36.8 C)-99 F (37.2 C)] 98.6 F (37 C) (06/14 0807) Pulse Rate:  [57-70] 70 (06/14 0807) Resp:  [16] 16 (06/14 0807) BP: (98-144)/(65-76) 98/66 mmHg (06/14 0807) SpO2:  [92 %-95 %] 94 % (06/14 0807)  Intake/Output from previous day: 06/13 0701 - 06/14 0700 In: 360 [P.O.:360] Out: 1514 [Urine:1513; Emesis/NG output:1] Intake/Output this shift:    Physical Exam: Ambulating with assistance.  Dressing CDI.  Mild swelling at top without significant drainage.  Lab Results:  Recent Labs  10/20/12 1233  WBC 11.1*  HGB 12.7*  HCT 36.6*  PLT 276   BMET  Recent Labs  10/20/12 1233  NA 141  K 4.1  CL 102  CO2 32  GLUCOSE 135*  BUN 18  CREATININE 0.73  CALCIUM 9.4    Studies/Results: Dg Cervical Spine 2 Or 3 Views  10/21/2012   *RADIOLOGY REPORT*  Clinical Data: Evaluate fusion, postop.  CERVICAL SPINE - 2-3 VIEW  Comparison: 10/20/2012 and CT cervical spine 10/18/2012.  Findings: The cervical spine is visualized from the occiput to the cervicothoracic junction.  Interval posterior fusion from C4 to C7. Previously seen C5-7 anterior cervical fusion, at C5 fracture, and subsidence of the C5-6 and C6-7 interbody spacers are again noted.  There is straightening of the normal cervical lordosis. Prevertebral soft tissues appear within normal limits.  Surgical drain is seen in the posterior soft tissues.  Visualized lung apices are clear.  IMPRESSION:  1.  Interval C4-7 posterior cervical fusion without complicating feature. 2.  Preexisting anterior C5-7 cervical fusion with C5 fracture and subsidence of interbody spacers, as on 10/18/2012.   Original Report Authenticated By: Leanna Battles, M.D.    Assessment/Plan: Transfer to 4N.  PT to mobilize patient.    LOS: 3 days    Dorian Heckle, MD 10/23/2012, 9:39 AM

## 2012-10-24 LAB — GLUCOSE, CAPILLARY
Glucose-Capillary: 142 mg/dL — ABNORMAL HIGH (ref 70–99)
Glucose-Capillary: 137 mg/dL — ABNORMAL HIGH (ref 70–99)

## 2012-10-24 MED ORDER — METHOCARBAMOL 500 MG PO TABS: 500.0000 mg | ORAL_TABLET | Freq: Four times a day (QID) | ORAL | Status: DC | PRN

## 2012-10-24 NOTE — Progress Notes (Signed)
Subjective: Patient reports feeling better and more steady with walking.  Objective: Vital signs in last 24 hours: Temp:  [97.6 F (36.4 C)-98.6 F (37 C)] 98.4 F (36.9 C) (06/15 1610) Pulse Rate:  [57-79] 57 (06/15 0638) Resp:  [16-20] 18 (06/15 0638) BP: (98-138)/(45-81) 123/66 mmHg (06/15 0638) SpO2:  [94 %-100 %] 98 % (06/15 0638) Weight:  [86 kg (189 lb 9.5 oz)] 86 kg (189 lb 9.5 oz) (06/15 0050)  Intake/Output from previous day:   Intake/Output this shift:    Physical Exam: Dressings CDI. Ambulating well.   Lab Results: No results found for this basename: WBC, HGB, HCT, PLT,  in the last 72 hours BMET No results found for this basename: NA, K, CL, CO2, GLUCOSE, BUN, CREATININE, CALCIUM,  in the last 72 hours  Studies/Results: No results found.  Assessment/Plan: D/C home.  F/U with Dr. Yetta Barre in office.    LOS: 4 days    Dorian Heckle, MD 10/24/2012, 7:36 AM

## 2012-10-24 NOTE — Progress Notes (Signed)
Contacted case manager to set up home health PT. CM will call patient's wife to talk about the arrangements.  [place]ne [place]

## 2012-10-24 NOTE — Progress Notes (Signed)
Pt is discharged to go home. Assisted him to mobilize in the hall way.He walked down the hallway with stand by assistance, pritty much steady except couple of small unsteady steps to left, recommend to use a rolling walker but pt refused it. Dr.Stern watched him ambulating and gave the order to d/c him home today.  Danne Harbor

## 2012-10-24 NOTE — Progress Notes (Signed)
Physical Therapy Treatment Patient Details Name: Kenneth [name] MRN: [id number] DOB: 1951/03/26 Today's Date: 10/24/2012 Time: 4696-2952 PT Time Calculation (min): 17 min  PT Assessment / Plan / Recommendation Comments on Treatment Session  PT entered room and pt packed ready to go home.  Pt anxious to leave and impulsive at times.  Wife in room and anxious as well and ready to go.  Pt agreeable to ambulate with PT and perform stair prior to leaving.  Pt wanted to attempt to ambulate without RW.  Pt more alert this session and needed less assistance with ambulation.  However pt impulsive and continues to need one person assist to ambulate and perform stair negotiation.  Educated pt and pt's wife on using RW for ambulation for safety and increase independence.  Highly recommend HHPT at d/c.  Pt reported already having RW.  Pt left in w/c ready to d/c home and RN tech assisting pt off unit to car.  Pt needs HHPT for safety.     Follow Up Recommendations  Home health PT;Supervision/Assistance - 24 hour     Does the patient have the potential to tolerate intense rehabilitation     Barriers to Discharge        Equipment Recommendations  Rolling walker with 5" wheels    Recommendations for Other Services    Frequency Min 5X/week   Plan Discharge plan remains appropriate;Frequency remains appropriate    Precautions / Restrictions Precautions Precautions: Fall;Cervical Cervical Brace: Hard collar;Applied in supine position Restrictions Weight Bearing Restrictions: No   Pertinent Vitals/Pain C/o pain with ambulation and needed to sit in w/c.  Pt did not rate    Mobility  Bed Mobility Bed Mobility: Supine to Sit;Sitting - Scoot to Edge of Bed Rolling Right: 4: Min guard;With rail Details for Bed Mobility Assistance: Minguard for safety with heavy use of rail Transfers Transfers: Sit to Stand;Stand to Sit Sit to Stand: 4: Min assist;From bed Stand to Sit: 4: Min assist;To  chair/3-in-1 Details for Transfer Assistance: (A) to initaite transfer and slowly descend to recliner with cues for hand placement.  Pt tends to lean posterior against bed with initial stand. Ambulation/Gait Ambulation/Gait Assistance: 4: Min assist Ambulation Distance (Feet): 125 Feet Assistive device: None Ambulation/Gait Assistance Details: Pt needed (A) to maintain balance with max cues for safety.  Pt continues to lean left/right in lateral sway with ambulation.  Pt did not want to use RW initially however educated on the importance of RW for overall safety. Gait Pattern: Shuffle;Lateral trunk lean to right;Lateral trunk lean to left Gait velocity: decreased Stairs: Yes Stairs Assistance: 4: Min assist Stairs Assistance Details (indicate cue type and reason): (A) to maintain balance and prevent LOB due to lateral lean heavily on rail to the left.   Stair Management Technique: One rail Left;Forwards Number of Stairs: 1    Exercises     PT Diagnosis:    PT Problem List:   PT Treatment Interventions:     PT Goals Acute Rehab PT Goals PT Goal Formulation: With patient/family Time For Goal Achievement: 10/30/12 Potential to Achieve Goals: Good Pt will go Supine/Side to Sit: with rail;with supervision PT Goal: Supine/Side to Sit - Progress: Progressing toward goal Pt will go Sit to Stand: with modified independence;with upper extremity assist PT Goal: Sit to Stand - Progress: Progressing toward goal Pt will Ambulate: 51 - 150 feet;with modified independence;with least restrictive assistive device PT Goal: Ambulate - Progress: Progressing toward goal Pt will Go Up / Down  Stairs: 3-5 stairs;with modified independence;with least restrictive assistive device PT Goal: Up/Down Stairs - Progress: Progressing toward goal  Visit Information  Last PT Received On: 10/24/12 Assistance Needed: +1    Subjective Data  Subjective: "I'm going home right now." Patient Stated Goal: to go home    Cognition  Cognition Arousal/Alertness: Awake/alert Behavior During Therapy: Impulsive Overall Cognitive Status: Impaired/Different from baseline Area of Impairment: Attention;Memory;Safety/judgement;Awareness;Problem solving Current Attention Level: Selective Memory: Decreased recall of precautions Safety/Judgement: Decreased awareness of safety;Decreased awareness of deficits Awareness: Anticipatory General Comments: Pt impuslive and needs cues for safety.  Pt ready to d/c home and very anxious to get home.    Balance     End of Session PT - End of Session Equipment Utilized During Treatment: Gait belt;Cervical collar Activity Tolerance: Patient tolerated treatment well Patient left: Other (comment) (In w/c to d/c home) Nurse Communication: Mobility status   GP     Zamoria Boss 10/24/2012, 2:59 PM Jake Shark, PT DPT [phone]

## 2012-10-24 NOTE — Progress Notes (Signed)
   CARE MANAGEMENT NOTE 10/24/2012  Patient:  Kenneth [name], Kenneth [name]   Account Number:  [id number]  Date Initiated:  10/24/2012  Documentation initiated by:  [facility]  Subjective/Objective Assessment:   Posterior cervical fusion C4-C7     Action/Plan:   HH PT recommended   Anticipated DC Date:  10/24/2012   Anticipated DC Plan:  HOME W HOME HEALTH SERVICES      DC Planning Services  CM consult      [facility] Choice  HOME HEALTH   Choice offered to / List presented to:  [name]        HH arranged  HH-2 PT      HH agency  [facility]   Status of service:  Completed, signed off Medicare Important Message given?   (If response is "NO", the following Medicare IM given date fields will be blank) Date Medicare IM given:   Date Additional Medicare IM given:    Discharge Disposition:  HOME W HOME HEALTH SERVICES  Per UR Regulation:    If discussed at Long Length of Stay Meetings, dates discussed:    Comments:  10/24/2012 1500 NCM contacted pt and offered choice for [facility]. Spoke to wife and no preference. She is familiar with [facility] and agreeable to [facility] for [facility] PT. Referral sent to [facility] for [facility] PT with soc needed for 24-48 hours (surgical pt) Isidoro Donning RN CCM Case Mgmt phone [phone]

## 2013-09-16 ENCOUNTER — Encounter: Payer: Self-pay | Admitting: Internal Medicine

## 2013-11-27 IMAGING — CR DG CERVICAL SPINE 2 OR 3 VIEWS
2 series · 2 of 2 positions shown · non-contrast
Comparison: 10/20/2012 and CT cervical spine 10/18/2012.

CLINICAL DATA: Evaluate fusion, postop.

CERVICAL SPINE - 2-3 VIEW

[w c-spine lat]
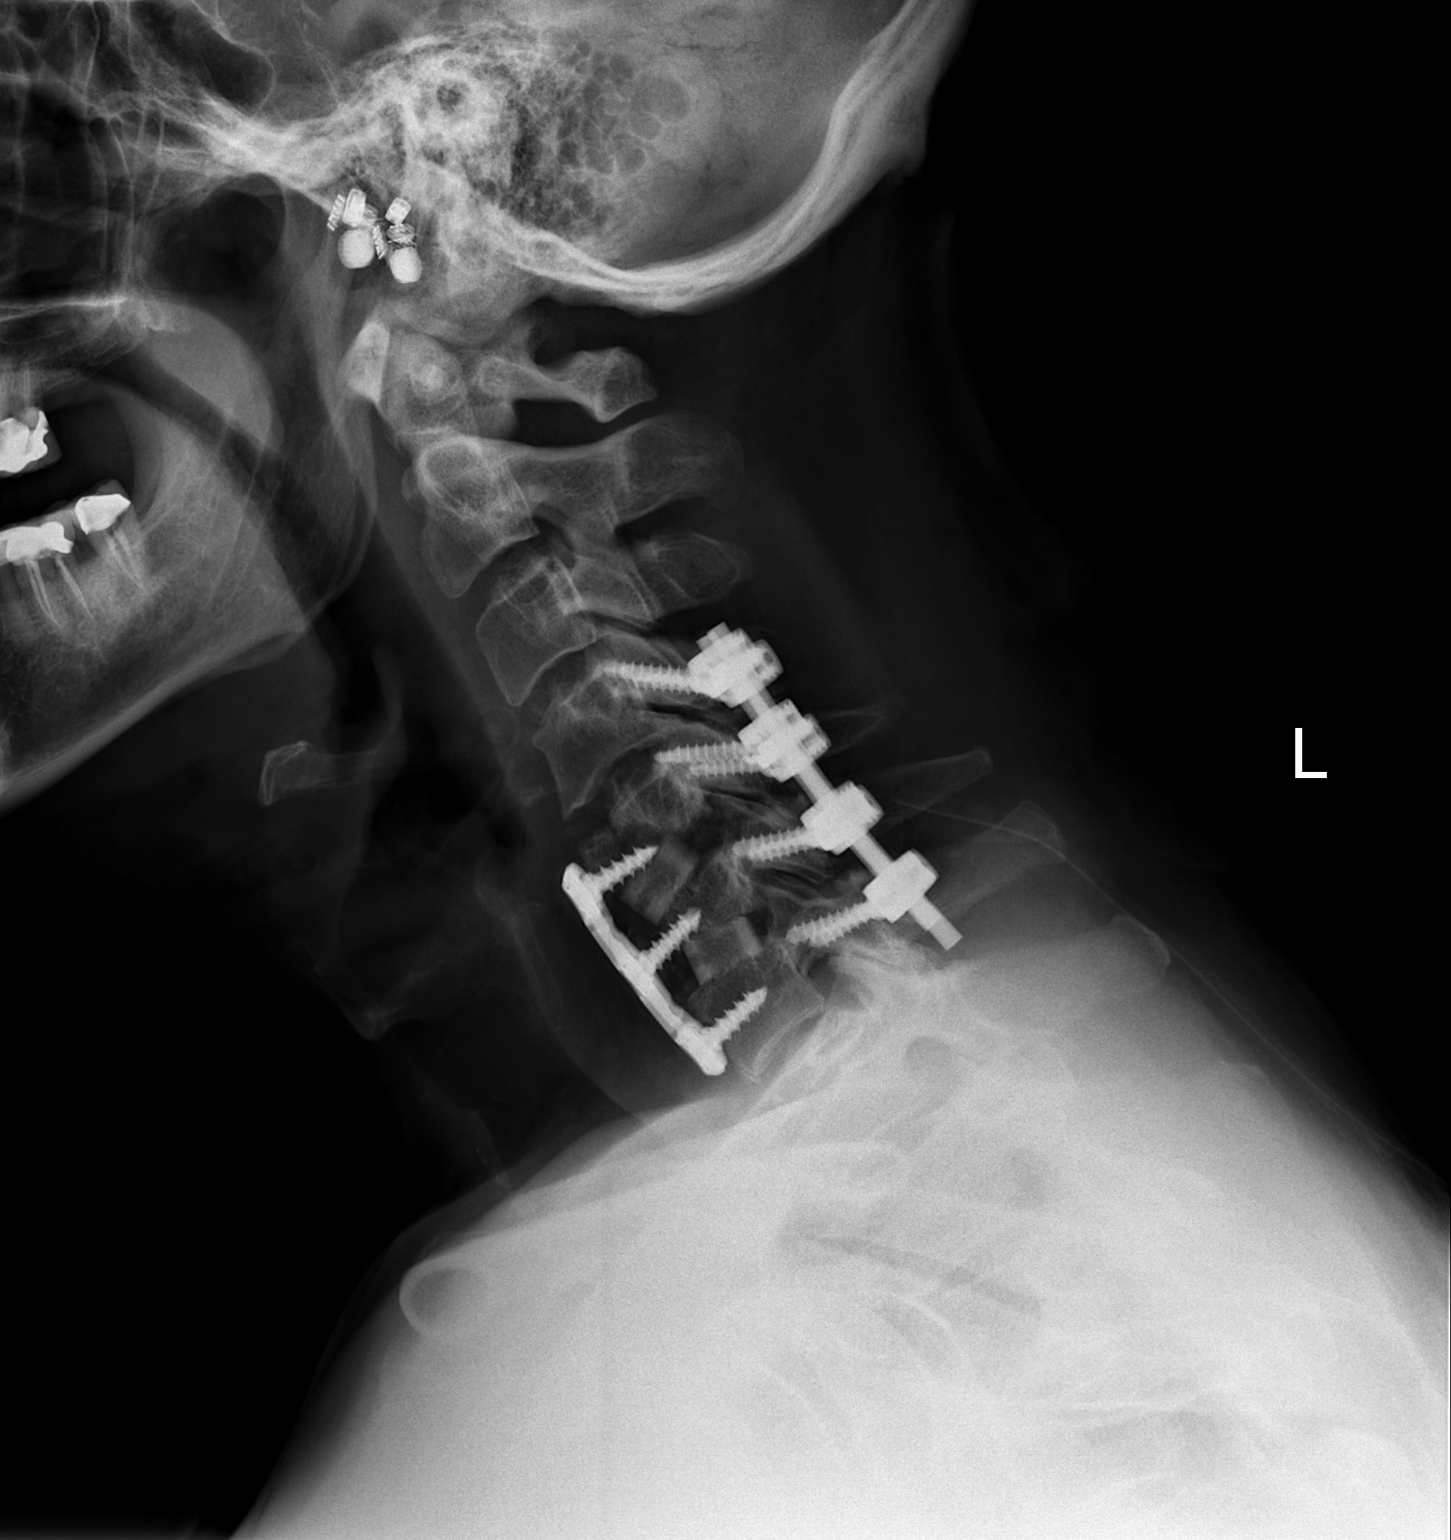

[w c-spine a.p.]
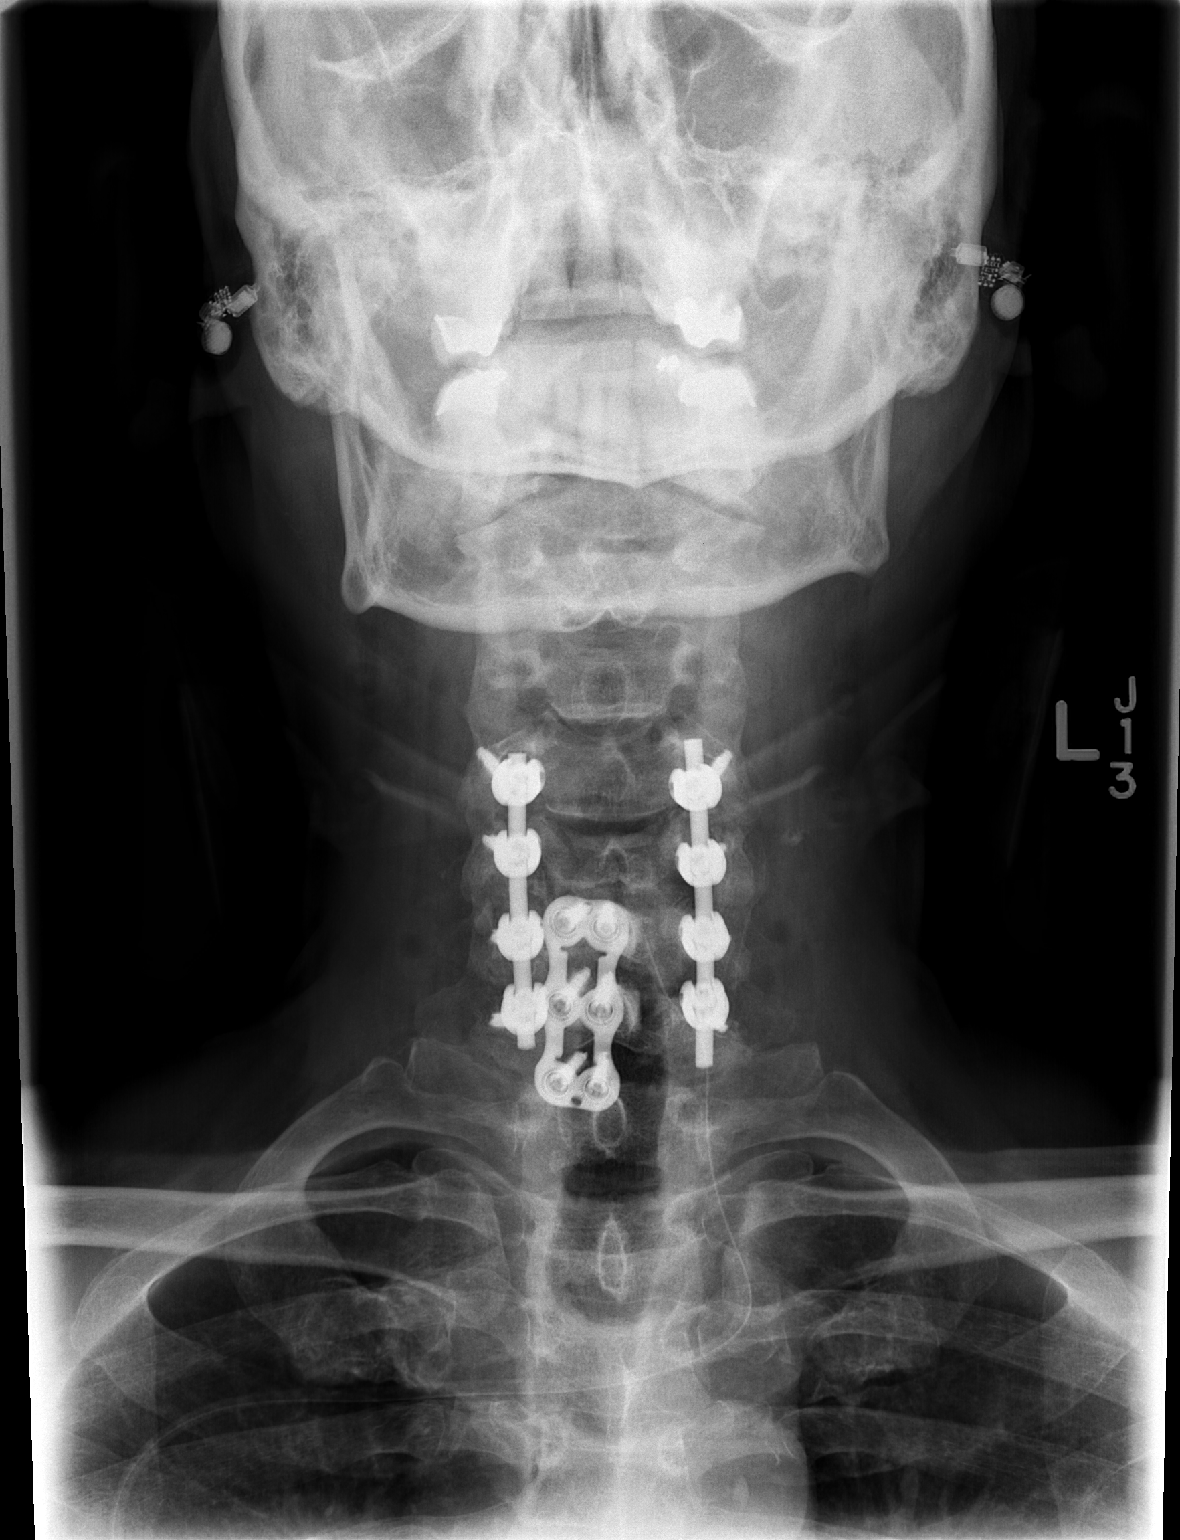

[2 of 2 positions shown; findings below may reference images not displayed]

FINDINGS: The cervical spine is visualized from the occiput to the
cervicothoracic junction.  Interval posterior fusion from C4 to C7.
Previously seen C5-7 anterior cervical fusion, at C5 fracture, and
subsidence of the C5-6 and C6-7 interbody spacers are again noted.

There is straightening of the normal cervical lordosis.
Prevertebral soft tissues appear within normal limits.  Surgical
drain is seen in the posterior soft tissues.  Visualized lung
apices are clear.
IMPRESSION: 1.  Interval C4-7 posterior cervical fusion without complicating
feature.
2.  Preexisting anterior C5-7 cervical fusion with C5 fracture and
subsidence of interbody spacers, as on 10/18/2012.

## 2013-12-08 ENCOUNTER — Encounter: Payer: Self-pay | Admitting: Internal Medicine

## 2013-12-29 ENCOUNTER — Encounter: Payer: Self-pay | Admitting: Internal Medicine

## 2014-02-15 ENCOUNTER — Ambulatory Visit (AMBULATORY_SURGERY_CENTER): Payer: Self-pay

## 2014-02-15 VITALS — Ht 71.0 in | Wt 231.0 lb

## 2014-02-15 DIAGNOSIS — Z860100 Personal history of colon polyps, unspecified: Secondary | ICD-10-CM

## 2014-02-15 NOTE — Progress Notes (Signed)
No allergies to eggs or soy No past problems with anesthesia No home oxygen No diet/weight loss meds  Has email  Emmi instructions given for colonoscopy 

## 2014-03-01 ENCOUNTER — Encounter: Payer: Self-pay | Admitting: Internal Medicine

## 2014-03-01 ENCOUNTER — Ambulatory Visit (AMBULATORY_SURGERY_CENTER): Payer: Medicare Other | Admitting: Internal Medicine

## 2014-03-01 VITALS — BP 137/47 | HR 44 | Temp 97.9°F | Resp 18 | Ht 71.0 in | Wt 231.0 lb

## 2014-03-01 DIAGNOSIS — Z86010 Personal history of colonic polyps: Secondary | ICD-10-CM

## 2014-03-01 DIAGNOSIS — D120 Benign neoplasm of cecum: Secondary | ICD-10-CM | POA: Diagnosis not present

## 2014-03-01 DIAGNOSIS — K5730 Diverticulosis of large intestine without perforation or abscess without bleeding: Secondary | ICD-10-CM | POA: Diagnosis not present

## 2014-03-01 DIAGNOSIS — Z1211 Encounter for screening for malignant neoplasm of colon: Secondary | ICD-10-CM | POA: Diagnosis not present

## 2014-03-01 DIAGNOSIS — I10 Essential (primary) hypertension: Secondary | ICD-10-CM | POA: Diagnosis not present

## 2014-03-01 DIAGNOSIS — E119 Type 2 diabetes mellitus without complications: Secondary | ICD-10-CM | POA: Diagnosis not present

## 2014-03-01 DIAGNOSIS — D124 Benign neoplasm of descending colon: Secondary | ICD-10-CM

## 2014-03-01 LAB — GLUCOSE, CAPILLARY
Glucose-Capillary: 147 mg/dL — ABNORMAL HIGH (ref 70–99)
Glucose-Capillary: 118 mg/dL — ABNORMAL HIGH (ref 70–99)

## 2014-03-01 MED ORDER — SODIUM CHLORIDE 0.9 % IV SOLN: 500.0000 mL | INTRAVENOUS | Status: DC

## 2014-03-01 NOTE — Patient Instructions (Addendum)
Only 2 tiny polyps today! I removed them. You also have a condition called diverticulosis - common and not usually a problem. Please read the handout provided.  Next routine colonoscopy in 5 years - 2020  I appreciate the opportunity to care for you. Gatha Mayer, MD, FACG   YOU HAD AN ENDOSCOPIC PROCEDURE TODAY AT [facility]: Refer to the procedure report that was given to you for any specific questions about what was found during the examination.  If the procedure report does not answer your questions, please call your gastroenterologist to clarify.  If you requested that your care partner not be given the details of your procedure findings, then the procedure report has been included in a sealed envelope for you to review at your convenience later.  YOU SHOULD EXPECT: Some feelings of bloating in the abdomen. Passage of more gas than usual.  Walking can help get rid of the air that was put into your GI tract during the procedure and reduce the bloating. If you had a lower endoscopy (such as a colonoscopy or flexible sigmoidoscopy) you may notice spotting of blood in your stool or on the toilet paper. If you underwent a bowel prep for your procedure, then you may not have a normal bowel movement for a few days.  DIET: Your first meal following the procedure should be a light meal and then it is ok to progress to your normal diet.  A half-sandwich or bowl of soup is an example of a good first meal.  Heavy or fried foods are harder to digest and may make you feel nauseous or bloated.  Likewise meals heavy in dairy and vegetables can cause extra gas to form and this can also increase the bloating.  Drink plenty of fluids but you should avoid alcoholic beverages for 24 hours.  ACTIVITY: Your care partner should take you home directly after the procedure.  You should plan to take it easy, moving slowly for the rest of the day.  You can resume normal activity the day after  the procedure however you should NOT DRIVE or use heavy machinery for 24 hours (because of the sedation medicines used during the test).    SYMPTOMS TO REPORT IMMEDIATELY: A gastroenterologist can be reached at any hour.  During normal business hours, 8:30 AM to 5:00 PM Monday through Friday, call [phone].  After hours and on weekends, please call the GI answering service at [phone] who will take a message and have the physician on call contact you.   Following lower endoscopy (colonoscopy or flexible sigmoidoscopy):  Excessive amounts of blood in the stool  Significant tenderness or worsening of abdominal pains  Swelling of the abdomen that is new, acute  Fever of 100F or higher   FOLLOW UP: If any biopsies were taken you will be contacted by phone or by letter within the next 1-3 weeks.  Call your gastroenterologist if you have not heard about the biopsies in 3 weeks.  Our staff will call the home number listed on your records the next business day following your procedure to check on you and address any questions or concerns that you may have at that time regarding the information given to you following your procedure. This is a courtesy call and so if there is no answer at the home number and we have not heard from you through the emergency physician on call, we will assume that you have returned to your regular  daily activities without incident.  SIGNATURES/CONFIDENTIALITY: You and/or your care partner have signed paperwork which will be entered into your electronic medical record.  These signatures attest to the fact that that the information above on your After Visit Summary has been reviewed and is understood.  Full responsibility of the confidentiality of this discharge information lies with you and/or your care-partner.

## 2014-03-01 NOTE — Progress Notes (Signed)
A/ox3 pleased with MAC, report to Tracey RN 

## 2014-03-01 NOTE — Op Note (Signed)
[place]  [employer]. [place], [id number]   COLONOSCOPY PROCEDURE REPORT  PATIENT: Kenneth [name], Kenneth [name]  MR#: [id number] BIRTHDATE: 1950-07-18 , 32  yrs. old GENDER: male ENDOSCOPIST: Gatha Mayer, MD, [facility] PROCEDURE DATE:  03/01/2014 PROCEDURE:   Colonoscopy with biopsy First Screening Colonoscopy - Avg.  risk and is 50 yrs.  old or older - No.  Prior Negative Screening - Now for repeat screening. N/A  History of Adenoma - Now for follow-up colonoscopy & has been > or = to 3 yrs.  Yes hx of adenoma.  Has been 3 or more years since last colonoscopy.  Polyps Removed Today? Yes. ASA CLASS:   Class II INDICATIONS:surveillance colonoscopy based on a history of adenomatous colonic polyp(s). MEDICATIONS: Propofol 180 mg IV and Monitored anesthesia care  DESCRIPTION OF PROCEDURE:   After the risks benefits and alternatives of the procedure were thoroughly explained, informed consent was obtained.  The digital rectal exam revealed no abnormalities of the rectum, revealed the prostate was not enlarged, and revealed no prostatic nodules.   The LB [id number]  endoscope was introduced through the anus and advanced to the cecum, which was identified by both the appendix and ileocecal valve. No adverse events experienced.   The quality of the prep was excellent, using MiraLax  The instrument was then slowly withdrawn as the colon was fully examined.    COLON FINDINGS: Two sessile polyps measuring 2 mm in size were found in the descending colon and at the cecum.  A polypectomy was performed with cold forceps.  The resection was complete, the polyp tissue was completely retrieved and sent to histology.   There was mild diverticulosis noted throughout the entire examined colon. The examination was otherwise normal.   Right colon retroflexion included.  Retroflexed views revealed no abnormalities. The time to cecum=1 minutes 56 seconds.  Withdrawal time=14 minutes 20  seconds. The scope was withdrawn and the procedure completed. COMPLICATIONS: There were no immediate complications.  ENDOSCOPIC IMPRESSION: 1.   Two sessile polyps were found in the descending colon and at the cecum; polypectomy was performed with cold forceps 2.   Mild diverticulosis was noted throughout the entire examined colon 3.   The examination was otherwise normal - hx 4 adenomas 2011 max 25 mm  RECOMMENDATIONS: Repeat Colonoscopy in 5 years likely  eSigned:  Gatha Mayer, MD, [facility] 03/01/2014 12:13 PM   cc: Crist Infante, MD and The Patient

## 2014-03-01 NOTE — Progress Notes (Signed)
Called to room to assist during endoscopic procedure.  Patient ID and intended procedure confirmed with present staff. Received instructions for my participation in the procedure from the performing physician.  

## 2014-03-02 ENCOUNTER — Telehealth: Payer: Self-pay | Admitting: *Deleted

## 2014-03-02 NOTE — Telephone Encounter (Signed)
  Follow up Call-  Call back number 03/01/2014  Post procedure Call Back phone  # [phone]  Permission to leave phone message Yes     Patient questions:  Do you have a fever, pain , or abdominal swelling? No. Pain Score  0 *  Have you tolerated food without any problems? Yes.    Have you been able to return to your normal activities? Yes.    Do you have any questions about your discharge instructions: Diet   No. Medications  No. Follow up visit  No.  Do you have questions or concerns about your Care? No.  Actions: * If pain score is 4 or above: No action needed, pain <4.

## 2014-03-06 ENCOUNTER — Encounter: Payer: Self-pay | Admitting: Internal Medicine

## 2014-03-06 NOTE — Progress Notes (Signed)
Quick Note:  ssp and adenoma Repeat colon 2020 ______

## 2014-06-05 DIAGNOSIS — Z0000 Encounter for general adult medical examination without abnormal findings: Secondary | ICD-10-CM | POA: Diagnosis not present

## 2014-06-05 DIAGNOSIS — E785 Hyperlipidemia, unspecified: Secondary | ICD-10-CM | POA: Diagnosis not present

## 2014-06-05 DIAGNOSIS — M859 Disorder of bone density and structure, unspecified: Secondary | ICD-10-CM | POA: Diagnosis not present

## 2014-06-05 DIAGNOSIS — E119 Type 2 diabetes mellitus without complications: Secondary | ICD-10-CM | POA: Diagnosis not present

## 2014-06-05 DIAGNOSIS — Z125 Encounter for screening for malignant neoplasm of prostate: Secondary | ICD-10-CM | POA: Diagnosis not present

## 2014-06-05 DIAGNOSIS — I10 Essential (primary) hypertension: Secondary | ICD-10-CM | POA: Diagnosis not present

## 2014-06-19 DIAGNOSIS — E119 Type 2 diabetes mellitus without complications: Secondary | ICD-10-CM | POA: Diagnosis not present

## 2014-06-19 DIAGNOSIS — Z6834 Body mass index (BMI) 34.0-34.9, adult: Secondary | ICD-10-CM | POA: Diagnosis not present

## 2014-06-19 DIAGNOSIS — Z0000 Encounter for general adult medical examination without abnormal findings: Secondary | ICD-10-CM | POA: Diagnosis not present

## 2014-06-19 DIAGNOSIS — M8580 Other specified disorders of bone density and structure, unspecified site: Secondary | ICD-10-CM | POA: Diagnosis not present

## 2014-06-19 DIAGNOSIS — Z1389 Encounter for screening for other disorder: Secondary | ICD-10-CM | POA: Diagnosis not present

## 2014-06-19 DIAGNOSIS — I10 Essential (primary) hypertension: Secondary | ICD-10-CM | POA: Diagnosis not present

## 2014-06-19 DIAGNOSIS — Z125 Encounter for screening for malignant neoplasm of prostate: Secondary | ICD-10-CM | POA: Diagnosis not present

## 2014-06-19 DIAGNOSIS — R600 Localized edema: Secondary | ICD-10-CM | POA: Diagnosis not present

## 2014-06-19 DIAGNOSIS — M5412 Radiculopathy, cervical region: Secondary | ICD-10-CM | POA: Diagnosis not present

## 2014-06-19 DIAGNOSIS — D126 Benign neoplasm of colon, unspecified: Secondary | ICD-10-CM | POA: Diagnosis not present

## 2014-06-19 DIAGNOSIS — G4733 Obstructive sleep apnea (adult) (pediatric): Secondary | ICD-10-CM | POA: Diagnosis not present

## 2014-06-19 DIAGNOSIS — Z23 Encounter for immunization: Secondary | ICD-10-CM | POA: Diagnosis not present

## 2014-09-29 DIAGNOSIS — Z6834 Body mass index (BMI) 34.0-34.9, adult: Secondary | ICD-10-CM | POA: Diagnosis not present

## 2014-09-29 DIAGNOSIS — E119 Type 2 diabetes mellitus without complications: Secondary | ICD-10-CM | POA: Diagnosis not present

## 2014-09-29 DIAGNOSIS — L408 Other psoriasis: Secondary | ICD-10-CM | POA: Diagnosis not present

## 2014-09-29 DIAGNOSIS — I10 Essential (primary) hypertension: Secondary | ICD-10-CM | POA: Diagnosis not present

## 2014-09-29 DIAGNOSIS — I872 Venous insufficiency (chronic) (peripheral): Secondary | ICD-10-CM | POA: Diagnosis not present

## 2014-11-29 DIAGNOSIS — Z794 Long term (current) use of insulin: Secondary | ICD-10-CM | POA: Diagnosis not present

## 2014-11-29 DIAGNOSIS — H2513 Age-related nuclear cataract, bilateral: Secondary | ICD-10-CM | POA: Diagnosis not present

## 2014-11-29 DIAGNOSIS — E109 Type 1 diabetes mellitus without complications: Secondary | ICD-10-CM | POA: Diagnosis not present

## 2014-12-18 DIAGNOSIS — E119 Type 2 diabetes mellitus without complications: Secondary | ICD-10-CM | POA: Diagnosis not present

## 2014-12-18 DIAGNOSIS — M859 Disorder of bone density and structure, unspecified: Secondary | ICD-10-CM | POA: Diagnosis not present

## 2014-12-18 DIAGNOSIS — L408 Other psoriasis: Secondary | ICD-10-CM | POA: Diagnosis not present

## 2014-12-18 DIAGNOSIS — Z6834 Body mass index (BMI) 34.0-34.9, adult: Secondary | ICD-10-CM | POA: Diagnosis not present

## 2014-12-18 DIAGNOSIS — E785 Hyperlipidemia, unspecified: Secondary | ICD-10-CM | POA: Diagnosis not present

## 2014-12-18 DIAGNOSIS — I10 Essential (primary) hypertension: Secondary | ICD-10-CM | POA: Diagnosis not present

## 2015-01-24 ENCOUNTER — Encounter (HOSPITAL_COMMUNITY): Payer: Self-pay | Admitting: Emergency Medicine

## 2015-01-24 ENCOUNTER — Emergency Department (HOSPITAL_COMMUNITY)
Admission: EM | Admit: 2015-01-24 | Discharge: 2015-01-24 | Disposition: A | Discharge status: Home / Self Care | Payer: No Typology Code available for payment source | Attending: Emergency Medicine | Admitting: Emergency Medicine

## 2015-01-24 ENCOUNTER — Emergency Department (HOSPITAL_COMMUNITY): Payer: No Typology Code available for payment source

## 2015-01-24 DIAGNOSIS — Z87891 Personal history of nicotine dependence: Secondary | ICD-10-CM | POA: Insufficient documentation

## 2015-01-24 DIAGNOSIS — Y998 Other external cause status: Secondary | ICD-10-CM | POA: Insufficient documentation

## 2015-01-24 DIAGNOSIS — Z7982 Long term (current) use of aspirin: Secondary | ICD-10-CM | POA: Insufficient documentation

## 2015-01-24 DIAGNOSIS — E119 Type 2 diabetes mellitus without complications: Secondary | ICD-10-CM | POA: Diagnosis not present

## 2015-01-24 DIAGNOSIS — M545 Low back pain: Secondary | ICD-10-CM

## 2015-01-24 DIAGNOSIS — M5440 Lumbago with sciatica, unspecified side: Secondary | ICD-10-CM | POA: Diagnosis not present

## 2015-01-24 DIAGNOSIS — Z86010 Personal history of colonic polyps: Secondary | ICD-10-CM | POA: Insufficient documentation

## 2015-01-24 DIAGNOSIS — Y92410 Unspecified street and highway as the place of occurrence of the external cause: Secondary | ICD-10-CM | POA: Diagnosis not present

## 2015-01-24 DIAGNOSIS — S3992XA Unspecified injury of lower back, initial encounter: Secondary | ICD-10-CM | POA: Diagnosis present

## 2015-01-24 DIAGNOSIS — I10 Essential (primary) hypertension: Secondary | ICD-10-CM | POA: Diagnosis not present

## 2015-01-24 DIAGNOSIS — Z794 Long term (current) use of insulin: Secondary | ICD-10-CM | POA: Diagnosis not present

## 2015-01-24 DIAGNOSIS — E785 Hyperlipidemia, unspecified: Secondary | ICD-10-CM | POA: Diagnosis not present

## 2015-01-24 DIAGNOSIS — Z79899 Other long term (current) drug therapy: Secondary | ICD-10-CM | POA: Diagnosis not present

## 2015-01-24 DIAGNOSIS — Y9389 Activity, other specified: Secondary | ICD-10-CM | POA: Insufficient documentation

## 2015-01-24 DIAGNOSIS — Z23 Encounter for immunization: Secondary | ICD-10-CM | POA: Diagnosis not present

## 2015-01-24 DIAGNOSIS — M859 Disorder of bone density and structure, unspecified: Secondary | ICD-10-CM | POA: Diagnosis not present

## 2015-01-24 MED ORDER — METHOCARBAMOL 500 MG PO TABS: 500.0000 mg | ORAL_TABLET | Freq: Two times a day (BID) | ORAL | Status: DC | PRN

## 2015-01-24 MED ORDER — NAPROXEN 250 MG PO TABS: 250.0000 mg | ORAL_TABLET | Freq: Two times a day (BID) | ORAL | Status: DC
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
## 2015-01-24 NOTE — Discharge Instructions (Signed)
Back Exercises °Back exercises help treat and prevent back injuries. The goal of back exercises is to increase the strength of your abdominal and back muscles and the flexibility of your back. These exercises should be started when you no longer have back pain. Back exercises include: °· Pelvic Tilt. Lie on your back with your knees bent. Tilt your pelvis until the lower part of your back is against the floor. Hold this position 5 to 10 sec and repeat 5 to 10 times. °· Knee to Chest. Pull first 1 knee up against your chest and hold for 20 to 30 seconds, repeat this with the other knee, and then both knees. This may be done with the other leg straight or bent, whichever feels better. °· Sit-Ups or Curl-Ups. Bend your knees 90 degrees. Start with tilting your pelvis, and do a partial, slow sit-up, lifting your trunk only 30 to 45 degrees off the floor. Take at least 2 to 3 seconds for each sit-up. Do not do sit-ups with your knees out straight. If partial sit-ups are difficult, simply do the above but with only tightening your abdominal muscles and holding it as directed. °· Hip-Lift. Lie on your back with your knees flexed 90 degrees. Push down with your feet and shoulders as you raise your hips a couple inches off the floor; hold for 10 seconds, repeat 5 to 10 times. °· Back arches. Lie on your stomach, propping yourself up on bent elbows. Slowly press on your hands, causing an arch in your low back. Repeat 3 to 5 times. Any initial stiffness and discomfort should lessen with repetition over time. °· Shoulder-Lifts. Lie face down with arms beside your body. Keep hips and torso pressed to floor as you slowly lift your head and shoulders off the floor. °Do not overdo your exercises, especially in the beginning. Exercises may cause you some mild back discomfort which lasts for a few minutes; however, if the pain is more severe, or lasts for more than 15 minutes, do not continue exercises until you see your caregiver.  Improvement with exercise therapy for back problems is slow.  °See your caregivers for assistance with developing a proper back exercise program. °Document Released: 06/05/2004 Document Revised: 07/21/2011 Document Reviewed: 02/27/2011 °Exit[employer]® Patient Information ©2015 [employer]. This information is not intended to replace advice given to you by your health care provider. Make sure you discuss any questions you have with your health care provider. °Back Pain, Adult °Low back pain is very common. About 1 in 5 people have back pain. The cause of low back pain is rarely dangerous. The pain often gets better over time. About half of people with a sudden onset of back pain feel better in just 2 weeks. About 8 in 10 people feel better by 6 weeks.  °CAUSES °Some common causes of back pain include: °· Strain of the muscles or ligaments supporting the spine. °· Wear and tear (degeneration) of the spinal discs. °· Arthritis. °· Direct injury to the back. °DIAGNOSIS °Most of the time, the direct cause of low back pain is not known. However, back pain can be treated effectively even when the exact cause of the pain is unknown. Answering your caregiver's questions about your overall health and symptoms is one of the most accurate ways to make sure the cause of your pain is not dangerous. If your caregiver needs more information, he or she may order lab work or imaging tests (X-rays or MRIs). However, even if imaging tests show changes in your back,   back, this usually does not require surgery. °HOME CARE INSTRUCTIONS °For many people, back pain returns. Since low back pain is rarely dangerous, it is often a condition that people can learn to manage on their own.  °· Remain active. It is stressful on the back to sit or stand in one place. Do not sit, drive, or stand in one place for more than 30 minutes at a time. Take short walks on level surfaces as soon as pain allows. Try to increase the length of time you walk each  day. °· Do not stay in bed. Resting more than 1 or 2 days can delay your recovery. °· Do not avoid exercise or work. Your body is made to move. It is not dangerous to be active, even though your back may hurt. Your back will likely heal faster if you return to being active before your pain is gone. °· Pay attention to your body when you  bend and lift. Many people have less discomfort when lifting if they bend their knees, keep the load close to their bodies, and avoid twisting. Often, the most comfortable positions are those that put less stress on your recovering back. °· Find a comfortable position to sleep. Use a firm mattress and lie on your side with your knees slightly bent. If you lie on your back, put a pillow under your knees. °· Only take over-the-counter or prescription medicines as directed by your caregiver. Over-the-counter medicines to reduce pain and inflammation are often the most helpful. Your caregiver may prescribe muscle relaxant drugs. These medicines help dull your pain so you can more quickly return to your normal activities and healthy exercise. °· Put ice on the injured area. °· Put ice in a plastic bag. °· Place a towel between your skin and the bag. °· Leave the ice on for 15-20 minutes, 03-04 times a day for the first 2 to 3 days. After that, ice and heat may be alternated to reduce pain and spasms. °· Ask your caregiver about trying back exercises and gentle massage. This may be of some benefit. °· Avoid feeling anxious or stressed. Stress increases muscle tension and can worsen back pain. It is important to recognize when you are anxious or stressed and learn ways to manage it. Exercise is a great option. °SEEK MEDICAL CARE IF: °· You have pain that is not relieved with rest or medicine. °· You have pain that does not improve in 1 week. °· You have new symptoms. °· You are generally not feeling well. °SEEK IMMEDIATE MEDICAL CARE IF:  °· You have pain that radiates from your back into  your legs. °· You develop new bowel or bladder control problems. °· You have unusual weakness or numbness in your arms or legs. °· You develop nausea or vomiting. °· You develop abdominal pain. °· You feel faint. °Document Released: 04/28/2005 Document Revised: 10/28/2011 Document Reviewed: 08/30/2013 °ExitCare® Patient Information ©2015 [employer]. This information is not intended to replace advice given to you by your health care provider. Make sure you discuss any questions you have with your health care provider. °Motor Vehicle Collision °It is common to have multiple bruises and sore muscles after a motor vehicle collision (MVC). These tend to feel worse for the first 24 hours. You may have the most stiffness and soreness over the first several hours. You may also feel worse when you wake up the first morning after your collision. After this point, you will usually begin to improve with each day. The speed of improvement often depends on the severity of the collision,   the number of injuries, and the location and nature of these injuries. °HOME CARE INSTRUCTIONS °· Put ice on the injured area. °¨ Put ice in a plastic bag. °¨ Place a towel between your skin and the bag. °¨ Leave the ice on for 15-20 minutes, 3-4 times a day, or as directed by your health care provider. °· Drink enough fluids to keep your urine clear or pale yellow. Do not drink alcohol. °· Take a warm shower or bath once or twice a day. This will increase blood flow to sore muscles. °· You may return to activities as directed by your caregiver. Be careful when lifting, as this may aggravate neck or back pain. °· Only take over-the-counter or prescription medicines for pain, discomfort, or fever as directed by your caregiver. Do not use aspirin. This may increase bruising and bleeding. °SEEK IMMEDIATE MEDICAL CARE IF: °· You have numbness, tingling, or weakness in the arms or legs. °· You develop severe headaches not relieved with  medicine. °· You have severe neck pain, especially tenderness in the middle of the back of your neck. °· You have changes in bowel or bladder control. °· There is increasing pain in any area of the body. °· You have shortness of breath, light-headedness, dizziness, or fainting. °· You have chest pain. °· You feel sick to your stomach (nauseous), throw up (vomit), or sweat. °· You have increasing abdominal discomfort. °· There is blood in your urine, stool, or vomit. °· You have pain in your shoulder (shoulder strap areas). °· You feel your symptoms are getting worse. °MAKE SURE YOU: °· Understand these instructions. °· Will watch your condition. °· Will get help right away if you are not doing well or get worse. °Document Released: 04/28/2005 Document Revised: 09/12/2013 Document Reviewed: 09/25/2010 °Exit[employer]® Patient Information ©2015 [employer]. This information is not intended to replace advice given to you by your health care provider. Make sure you discuss any questions you have with your health care provider. ° °

## 2015-03-19 DIAGNOSIS — L821 Other seborrheic keratosis: Secondary | ICD-10-CM | POA: Diagnosis not present

## 2015-03-19 DIAGNOSIS — D2261 Melanocytic nevi of right upper limb, including shoulder: Secondary | ICD-10-CM | POA: Diagnosis not present

## 2015-03-19 DIAGNOSIS — D225 Melanocytic nevi of trunk: Secondary | ICD-10-CM | POA: Diagnosis not present

## 2015-03-19 DIAGNOSIS — L308 Other specified dermatitis: Secondary | ICD-10-CM | POA: Diagnosis not present

## 2015-04-16 DIAGNOSIS — E784 Other hyperlipidemia: Secondary | ICD-10-CM | POA: Diagnosis not present

## 2015-04-16 DIAGNOSIS — Z6834 Body mass index (BMI) 34.0-34.9, adult: Secondary | ICD-10-CM | POA: Diagnosis not present

## 2015-04-16 DIAGNOSIS — E119 Type 2 diabetes mellitus without complications: Secondary | ICD-10-CM | POA: Diagnosis not present

## 2015-04-16 DIAGNOSIS — I10 Essential (primary) hypertension: Secondary | ICD-10-CM | POA: Diagnosis not present

## 2015-07-17 DIAGNOSIS — R7309 Other abnormal glucose: Secondary | ICD-10-CM | POA: Diagnosis not present

## 2015-07-26 DIAGNOSIS — R918 Other nonspecific abnormal finding of lung field: Secondary | ICD-10-CM | POA: Diagnosis not present

## 2015-07-26 DIAGNOSIS — Z6835 Body mass index (BMI) 35.0-35.9, adult: Secondary | ICD-10-CM | POA: Diagnosis not present

## 2015-07-26 DIAGNOSIS — E119 Type 2 diabetes mellitus without complications: Secondary | ICD-10-CM | POA: Diagnosis not present

## 2015-07-26 DIAGNOSIS — L03116 Cellulitis of left lower limb: Secondary | ICD-10-CM | POA: Diagnosis not present

## 2015-07-26 DIAGNOSIS — R509 Fever, unspecified: Secondary | ICD-10-CM | POA: Diagnosis not present

## 2015-07-26 DIAGNOSIS — D72829 Elevated white blood cell count, unspecified: Secondary | ICD-10-CM | POA: Diagnosis not present

## 2015-07-26 DIAGNOSIS — R52 Pain, unspecified: Secondary | ICD-10-CM | POA: Diagnosis not present

## 2015-07-26 DIAGNOSIS — R739 Hyperglycemia, unspecified: Secondary | ICD-10-CM | POA: Diagnosis not present

## 2015-07-27 DIAGNOSIS — R600 Localized edema: Secondary | ICD-10-CM | POA: Diagnosis not present

## 2015-07-27 DIAGNOSIS — L03116 Cellulitis of left lower limb: Secondary | ICD-10-CM | POA: Diagnosis not present

## 2015-07-27 DIAGNOSIS — M79605 Pain in left leg: Secondary | ICD-10-CM | POA: Diagnosis not present

## 2015-07-27 DIAGNOSIS — L408 Other psoriasis: Secondary | ICD-10-CM | POA: Diagnosis not present

## 2015-07-27 DIAGNOSIS — R509 Fever, unspecified: Secondary | ICD-10-CM | POA: Diagnosis not present

## 2015-07-27 DIAGNOSIS — Z6835 Body mass index (BMI) 35.0-35.9, adult: Secondary | ICD-10-CM | POA: Diagnosis not present

## 2015-07-27 DIAGNOSIS — M7989 Other specified soft tissue disorders: Secondary | ICD-10-CM | POA: Diagnosis not present

## 2015-07-27 DIAGNOSIS — I872 Venous insufficiency (chronic) (peripheral): Secondary | ICD-10-CM | POA: Diagnosis not present

## 2015-08-31 DIAGNOSIS — E784 Other hyperlipidemia: Secondary | ICD-10-CM | POA: Diagnosis not present

## 2015-08-31 DIAGNOSIS — Z125 Encounter for screening for malignant neoplasm of prostate: Secondary | ICD-10-CM | POA: Diagnosis not present

## 2015-08-31 DIAGNOSIS — I10 Essential (primary) hypertension: Secondary | ICD-10-CM | POA: Diagnosis not present

## 2015-08-31 DIAGNOSIS — E1129 Type 2 diabetes mellitus with other diabetic kidney complication: Secondary | ICD-10-CM | POA: Diagnosis not present

## 2015-08-31 DIAGNOSIS — M859 Disorder of bone density and structure, unspecified: Secondary | ICD-10-CM | POA: Diagnosis not present

## 2015-09-06 DIAGNOSIS — M859 Disorder of bone density and structure, unspecified: Secondary | ICD-10-CM | POA: Diagnosis not present

## 2015-09-06 DIAGNOSIS — I10 Essential (primary) hypertension: Secondary | ICD-10-CM | POA: Diagnosis not present

## 2015-09-06 DIAGNOSIS — Z6835 Body mass index (BMI) 35.0-35.9, adult: Secondary | ICD-10-CM | POA: Diagnosis not present

## 2015-09-06 DIAGNOSIS — Z0000 Encounter for general adult medical examination without abnormal findings: Secondary | ICD-10-CM | POA: Diagnosis not present

## 2015-09-06 DIAGNOSIS — L408 Other psoriasis: Secondary | ICD-10-CM | POA: Diagnosis not present

## 2015-09-06 DIAGNOSIS — M5412 Radiculopathy, cervical region: Secondary | ICD-10-CM | POA: Diagnosis not present

## 2015-09-06 DIAGNOSIS — E119 Type 2 diabetes mellitus without complications: Secondary | ICD-10-CM | POA: Diagnosis not present

## 2015-09-06 DIAGNOSIS — E668 Other obesity: Secondary | ICD-10-CM | POA: Diagnosis not present

## 2015-09-06 DIAGNOSIS — R808 Other proteinuria: Secondary | ICD-10-CM | POA: Diagnosis not present

## 2015-09-06 DIAGNOSIS — Z1389 Encounter for screening for other disorder: Secondary | ICD-10-CM | POA: Diagnosis not present

## 2015-09-06 DIAGNOSIS — L03116 Cellulitis of left lower limb: Secondary | ICD-10-CM | POA: Diagnosis not present

## 2015-09-06 DIAGNOSIS — I872 Venous insufficiency (chronic) (peripheral): Secondary | ICD-10-CM | POA: Diagnosis not present

## 2015-09-27 DIAGNOSIS — R05 Cough: Secondary | ICD-10-CM | POA: Diagnosis not present

## 2015-09-27 DIAGNOSIS — E8770 Fluid overload, unspecified: Secondary | ICD-10-CM | POA: Diagnosis not present

## 2015-09-27 DIAGNOSIS — Z6835 Body mass index (BMI) 35.0-35.9, adult: Secondary | ICD-10-CM | POA: Diagnosis not present

## 2015-09-27 DIAGNOSIS — J40 Bronchitis, not specified as acute or chronic: Secondary | ICD-10-CM | POA: Diagnosis not present

## 2015-09-27 DIAGNOSIS — I10 Essential (primary) hypertension: Secondary | ICD-10-CM | POA: Diagnosis not present

## 2015-09-27 DIAGNOSIS — R918 Other nonspecific abnormal finding of lung field: Secondary | ICD-10-CM | POA: Diagnosis not present

## 2015-09-27 DIAGNOSIS — R600 Localized edema: Secondary | ICD-10-CM | POA: Diagnosis not present

## 2015-10-01 DIAGNOSIS — J40 Bronchitis, not specified as acute or chronic: Secondary | ICD-10-CM | POA: Diagnosis not present

## 2015-10-01 DIAGNOSIS — Z6834 Body mass index (BMI) 34.0-34.9, adult: Secondary | ICD-10-CM | POA: Diagnosis not present

## 2015-10-01 DIAGNOSIS — E8770 Fluid overload, unspecified: Secondary | ICD-10-CM | POA: Diagnosis not present

## 2015-10-01 DIAGNOSIS — R05 Cough: Secondary | ICD-10-CM | POA: Diagnosis not present

## 2015-10-01 DIAGNOSIS — I10 Essential (primary) hypertension: Secondary | ICD-10-CM | POA: Diagnosis not present

## 2015-10-26 DIAGNOSIS — R05 Cough: Secondary | ICD-10-CM | POA: Diagnosis not present

## 2015-11-09 ENCOUNTER — Encounter: Payer: Self-pay | Admitting: Cardiology

## 2015-11-09 ENCOUNTER — Ambulatory Visit (INDEPENDENT_AMBULATORY_CARE_PROVIDER_SITE_OTHER): Payer: Medicare Other | Admitting: Cardiology

## 2015-11-09 VITALS — BP 144/58 | HR 65 | Ht 71.0 in | Wt 250.0 lb

## 2015-11-09 DIAGNOSIS — I517 Cardiomegaly: Secondary | ICD-10-CM

## 2015-11-09 DIAGNOSIS — I10 Essential (primary) hypertension: Secondary | ICD-10-CM

## 2015-11-09 DIAGNOSIS — E669 Obesity, unspecified: Secondary | ICD-10-CM | POA: Diagnosis not present

## 2015-11-09 NOTE — Patient Instructions (Signed)
Medication Instructions:  The current medical regimen is effective;  continue present plan and medications.  Testing/Procedures: Your physician has requested that you have an echocardiogram. Echocardiography is a painless test that uses sound waves to create images of your heart. It provides your doctor with information about the size and shape of your heart and how well your heart's chambers and valves are working. This procedure takes approximately one hour. There are no restrictions for this procedure.  Follow-Up: Further follow up will be based on these results.  If you need a refill on your cardiac medications before your next appointment, please call your pharmacy.  Thank you for choosing [facility]!!

## 2015-11-09 NOTE — Progress Notes (Addendum)
Cardiology Office Note    Date:  11/09/2015   ID:  Kenneth [name], DOB 12/15/1950, MRN [id number]  PCP:  Jerlyn Ly, MD  Cardiologist:   Candee Furbish, MD   Chief Complaint  Patient presents with  . New Patient (Initial Visit)    History of Present Illness:  Kenneth [name] is a 65 y.o. male here for the evaluation of cardiomegaly and fluid overload at the request of Dr. Crist Infante.  Has a prior history of diabetes, hyperlipidemia, hypertension and neck fracture cervical spine.  Nonsmoker. Blood pressure is been under good control. Swelling in his legs and cough after a trip to [place].He usually goes on a trip after his tax season is over. He is a Engineer, maintenance (IT). He was in [place] when he began to feel worse. The hotel doctor sent him to the hospital via ambulance ($700 bill he states). He was given IV Lasix, antibiotic, blood pressure medication.  He was given lisinopril and Lasix. Still complaining of cough especially when laying flat and feels a bit winded. He has completed Avelox antibiotics. Cough continued despite this. However, it is currently resolved.   He was given prednisone and Nexium for cough. His ACE inhibitor was continued. BNP was normal 20. Creatinine was 0.7. Potassium 4.3, chest x-ray from 07/26/2015 showed interval development of small right pleural effusion versus interval scar formation.  Currently, he is feeling much better. He is no longer having the cough. He has chronic left leg rated and right swelling. He has had ultrasound which showed no evidence of DVT. There was concerns about cardiomegaly on chest x-ray.   Past Medical History  Diagnosis Date  . Diabetes mellitus   . Hyperlipidemia   . Hypertension   . Broken neck ([place])   . History of colon polyps - adenomas 04/03/2010    Past Surgical History  Procedure Laterality Date  . Colonoscopy w/ polypectomy  04/03/2010    4 polyps 8-64mm, worst TV adenoma with high-grade dysplasia  . Colonoscopy   09/30/2010    diverticulosis, small internal hemorrhoids  . No past surgeries    . Anterior cervical decomp/discectomy fusion N/A 10/01/2012    Procedure: ANTERIOR CERVICAL DECOMPRESSION/DISCECTOMY FUSION 2 LEVELS;  Surgeon: Eustace Moore, MD;  Location: [facility];  Service: Neurosurgery;  Laterality: N/A;  Cervical five-six,Cervical six-seven  . Tonsillectomy    . Posterior cervical fusion/foraminotomy Left 10/20/2012    Procedure: C/5-6,C/6-7 Lami/Multi level,POSTERIOR CERVICAL FUSION/FORAMINOTOMY C/4-7;  Surgeon: Eustace Moore, MD;  Location: [facility];  Service: Neurosurgery;  Laterality: Left;  Cervical Five-Six, Cervical Six-Seven Laminectomies, Posterior Cervical Fusion/Foraminotomies Cervical Four through Seven.     Current Medications: Outpatient Prescriptions Prior to Visit  Medication Sig Dispense Refill  . aspirin 81 MG tablet Take 81 mg by mouth daily.      Marland Kitchen atorvastatin (LIPITOR) 10 MG tablet Take 40 mg by mouth daily.     . benazepril-hydrochlorthiazide (LOTENSIN HCT) 20-25 MG per tablet Take 1 tablet by mouth daily.      . calcium carbonate (OS-CAL) 600 MG TABS Take 600 mg by mouth 2 (two) times daily with a meal.      . felodipine (PLENDIL) 5 MG 24 hr tablet Take 5 mg by mouth daily. ER    . insulin aspart (NOVOLOG) 100 UNIT/ML injection Inject 10 Units into the skin 3 (three) times daily with meals.    . insulin detemir (LEVEMIR) 100 UNIT/ML injection Inject 20 Units into the skin every  morning.     . metFORMIN (GLUCOPHAGE) 1000 MG tablet Take 1,000 mg by mouth 2 (two) times daily with a meal.      . methocarbamol (ROBAXIN) 500 MG tablet Take 1 tablet (500 mg total) by mouth 2 (two) times daily as needed for muscle spasms. 20 tablet 0  . naproxen (NAPROSYN) 250 MG tablet Take 1 tablet (250 mg total) by mouth 2 (two) times daily with a meal. 30 tablet 0   No facility-administered medications prior to visit.     Allergies:   Penicillins   Social History   Social  History  . Marital Status: Married    Spouse Name: N/A  . Number of Children: N/A  . Years of Education: N/A   Social History Main Topics  . Smoking status: Former Smoker -- 2 years    Quit date: 09/22/1969  . Smokeless tobacco: Never Used  . Alcohol Use: No  . Drug Use: No  . Sexual Activity: No   Other Topics Concern  . None   Social History Narrative     Family History:  The patient's family history includes Diabetes in his mother. There is no history of Stomach cancer, Rectal cancer, Pancreatic cancer, or Colon cancer.   ROS:   Please see the history of present illness.    ROS All other systems reviewed and are negative.   PHYSICAL EXAM:   VS:  BP 144/58 mmHg  Pulse 65  Ht 5\' 11"  (1.803 m)  Wt 250 lb (113.399 kg)  BMI 34.88 kg/m2   GEN: Well nourished, well developed, in no acute distress HEENT: normal Neck: no JVD, carotid bruits, or masses Cardiac: RRR; no murmurs, rubs, or gallops, 1-2+ left greater than right lower extremity edema  Respiratory:  clear to auscultation bilaterally, normal work of breathing GI: soft, nontender, nondistended, + BS, overweight MS: no deformity or atrophy Skin: warm and dry, no rash Neuro:  Alert and Oriented x 3, Strength and sensation are intact Psych: euthymic mood, full affect  Wt Readings from Last 3 Encounters:  11/09/15 250 lb (113.399 kg)  01/24/15 236 lb (107.049 kg)  03/01/14 231 lb (104.781 kg)      Studies/Labs Reviewed:   EKG:  EKG is ordered today.  The ekg ordered today demonstrates 11/09/15-sinus rhythm, no other abnormality heart rate 65.  Recent Labs: No results found for requested labs within last 365 days.   Lipid Panel No results found for: CHOL, TRIG, HDL, CHOLHDL, VLDL, LDLCALC, LDLDIRECT  Additional studies/ records that were reviewed today include:  Prior office notes, lab work come EKGs reviewed    ASSESSMENT:    1. Cardiomegaly   2. Essential hypertension   3. Obesity      PLAN:    In order of problems listed above:  Cardiomegaly  - Recent episode of fluid overload in [place] relieved with IV Lasix.  - This was exacerbated by him not having his medications when he went overseas which included benazepril hydrochlorothiazide.  - Chest x-ray here demonstrated potential cardiomegaly. I will check an echocardiogram given this finding to ensure that he has proper structure and function of his heart.  - Thankfully, his BNP is normal.  Essential hypertension  - Usually in the 140  - Continue with current regimen as per Dr. Joylene Draft  - Weight loss would be very helpful.  Obesity  - We discussed weight loss at length.  - Calorie control, smart choices, fruits and vegetables with exercise. He does go to  the gym 5 days a week.  Stress reduction especially during tax season  - He enjoys playing the grand piano in his office for stress reduction.  - Make sure he tries to get some exercise during this tax season and eats well.   Cough  - Resolved. Does not appear to be ACE inhibitor cough.  Lower extremity edema  - Encouraged use of his compression hose. Elevate lower extremities. Salt reduction. Exercise. Weight loss.    Medication Adjustments/Labs and Tests Ordered: Current medicines are reviewed at length with the patient today.  Concerns regarding medicines are outlined above.  Medication changes, Labs and Tests ordered today are listed in the Patient Instructions below. Patient Instructions  Medication Instructions:  The current medical regimen is effective;  continue present plan and medications.  Testing/Procedures: Your physician has requested that you have an echocardiogram. Echocardiography is a painless test that uses sound waves to create images of your heart. It provides your doctor with information about the size and shape of your heart and how well your heart's chambers and valves are working. This procedure takes approximately one hour. There are no  restrictions for this procedure.  Follow-Up: Further follow up will be based on these results.  If you need a refill on your cardiac medications before your next appointment, please call your pharmacy.  Thank you for choosing [facility]!!         Signed, Candee Furbish, MD  11/09/2015 10:33 AM    [facility] [address], [place], [place]  [address] Phone: [phone]; Fax: [phone]

## 2015-11-15 ENCOUNTER — Encounter: Payer: Self-pay | Admitting: Cardiology

## 2015-11-19 ENCOUNTER — Other Ambulatory Visit: Payer: Self-pay | Admitting: Cardiology

## 2015-11-19 DIAGNOSIS — Z794 Long term (current) use of insulin: Secondary | ICD-10-CM | POA: Diagnosis not present

## 2015-11-19 DIAGNOSIS — E119 Type 2 diabetes mellitus without complications: Secondary | ICD-10-CM | POA: Diagnosis not present

## 2015-11-19 DIAGNOSIS — H2513 Age-related nuclear cataract, bilateral: Secondary | ICD-10-CM | POA: Diagnosis not present

## 2015-11-19 DIAGNOSIS — H04123 Dry eye syndrome of bilateral lacrimal glands: Secondary | ICD-10-CM | POA: Diagnosis not present

## 2015-11-19 DIAGNOSIS — Z7984 Long term (current) use of oral hypoglycemic drugs: Secondary | ICD-10-CM | POA: Diagnosis not present

## 2015-12-03 ENCOUNTER — Ambulatory Visit (HOSPITAL_COMMUNITY): Payer: Medicare Other | Attending: Internal Medicine

## 2015-12-03 ENCOUNTER — Other Ambulatory Visit (HOSPITAL_COMMUNITY): Payer: Self-pay

## 2015-12-03 ENCOUNTER — Encounter (INDEPENDENT_AMBULATORY_CARE_PROVIDER_SITE_OTHER): Payer: Self-pay

## 2015-12-03 DIAGNOSIS — I517 Cardiomegaly: Secondary | ICD-10-CM | POA: Insufficient documentation

## 2015-12-20 DIAGNOSIS — M79675 Pain in left toe(s): Secondary | ICD-10-CM | POA: Diagnosis not present

## 2015-12-20 DIAGNOSIS — Z6836 Body mass index (BMI) 36.0-36.9, adult: Secondary | ICD-10-CM | POA: Diagnosis not present

## 2015-12-20 DIAGNOSIS — E119 Type 2 diabetes mellitus without complications: Secondary | ICD-10-CM | POA: Diagnosis not present

## 2016-01-19 DIAGNOSIS — Z23 Encounter for immunization: Secondary | ICD-10-CM | POA: Diagnosis not present

## 2016-02-18 DIAGNOSIS — Z6835 Body mass index (BMI) 35.0-35.9, adult: Secondary | ICD-10-CM | POA: Diagnosis not present

## 2016-02-18 DIAGNOSIS — E1129 Type 2 diabetes mellitus with other diabetic kidney complication: Secondary | ICD-10-CM | POA: Diagnosis not present

## 2016-02-18 DIAGNOSIS — M859 Disorder of bone density and structure, unspecified: Secondary | ICD-10-CM | POA: Diagnosis not present

## 2016-02-18 DIAGNOSIS — I10 Essential (primary) hypertension: Secondary | ICD-10-CM | POA: Diagnosis not present

## 2016-02-18 DIAGNOSIS — E784 Other hyperlipidemia: Secondary | ICD-10-CM | POA: Diagnosis not present

## 2016-06-05 DIAGNOSIS — E1129 Type 2 diabetes mellitus with other diabetic kidney complication: Secondary | ICD-10-CM | POA: Diagnosis not present

## 2016-07-02 DIAGNOSIS — I10 Essential (primary) hypertension: Secondary | ICD-10-CM | POA: Diagnosis not present

## 2016-07-02 DIAGNOSIS — Z6836 Body mass index (BMI) 36.0-36.9, adult: Secondary | ICD-10-CM | POA: Diagnosis not present

## 2016-07-02 DIAGNOSIS — E1129 Type 2 diabetes mellitus with other diabetic kidney complication: Secondary | ICD-10-CM | POA: Diagnosis not present

## 2016-07-08 DIAGNOSIS — Z6836 Body mass index (BMI) 36.0-36.9, adult: Secondary | ICD-10-CM | POA: Diagnosis not present

## 2016-07-08 DIAGNOSIS — I10 Essential (primary) hypertension: Secondary | ICD-10-CM | POA: Diagnosis not present

## 2016-07-08 DIAGNOSIS — E1129 Type 2 diabetes mellitus with other diabetic kidney complication: Secondary | ICD-10-CM | POA: Diagnosis not present

## 2016-07-08 DIAGNOSIS — R600 Localized edema: Secondary | ICD-10-CM | POA: Diagnosis not present

## 2016-07-18 DIAGNOSIS — E1129 Type 2 diabetes mellitus with other diabetic kidney complication: Secondary | ICD-10-CM | POA: Diagnosis not present

## 2016-09-02 DIAGNOSIS — I10 Essential (primary) hypertension: Secondary | ICD-10-CM | POA: Diagnosis not present

## 2016-09-02 DIAGNOSIS — E1129 Type 2 diabetes mellitus with other diabetic kidney complication: Secondary | ICD-10-CM | POA: Diagnosis not present

## 2016-09-02 DIAGNOSIS — Z6841 Body Mass Index (BMI) 40.0 and over, adult: Secondary | ICD-10-CM | POA: Diagnosis not present

## 2016-09-16 DIAGNOSIS — E1129 Type 2 diabetes mellitus with other diabetic kidney complication: Secondary | ICD-10-CM | POA: Diagnosis not present

## 2016-09-16 DIAGNOSIS — I10 Essential (primary) hypertension: Secondary | ICD-10-CM | POA: Diagnosis not present

## 2016-09-16 DIAGNOSIS — Z125 Encounter for screening for malignant neoplasm of prostate: Secondary | ICD-10-CM | POA: Diagnosis not present

## 2016-09-16 DIAGNOSIS — M859 Disorder of bone density and structure, unspecified: Secondary | ICD-10-CM | POA: Diagnosis not present

## 2016-09-16 DIAGNOSIS — E784 Other hyperlipidemia: Secondary | ICD-10-CM | POA: Diagnosis not present

## 2016-09-26 DIAGNOSIS — Z6836 Body mass index (BMI) 36.0-36.9, adult: Secondary | ICD-10-CM | POA: Diagnosis not present

## 2016-09-26 DIAGNOSIS — E1129 Type 2 diabetes mellitus with other diabetic kidney complication: Secondary | ICD-10-CM | POA: Diagnosis not present

## 2016-09-26 DIAGNOSIS — Z0000 Encounter for general adult medical examination without abnormal findings: Secondary | ICD-10-CM | POA: Diagnosis not present

## 2016-09-26 DIAGNOSIS — I872 Venous insufficiency (chronic) (peripheral): Secondary | ICD-10-CM | POA: Diagnosis not present

## 2016-09-26 DIAGNOSIS — J40 Bronchitis, not specified as acute or chronic: Secondary | ICD-10-CM | POA: Diagnosis not present

## 2016-09-26 DIAGNOSIS — L408 Other psoriasis: Secondary | ICD-10-CM | POA: Diagnosis not present

## 2016-09-26 DIAGNOSIS — R600 Localized edema: Secondary | ICD-10-CM | POA: Diagnosis not present

## 2016-09-26 DIAGNOSIS — R808 Other proteinuria: Secondary | ICD-10-CM | POA: Diagnosis not present

## 2016-09-26 DIAGNOSIS — M79605 Pain in left leg: Secondary | ICD-10-CM | POA: Diagnosis not present

## 2016-09-26 DIAGNOSIS — M79675 Pain in left toe(s): Secondary | ICD-10-CM | POA: Diagnosis not present

## 2016-09-26 DIAGNOSIS — Z23 Encounter for immunization: Secondary | ICD-10-CM | POA: Diagnosis not present

## 2016-09-26 DIAGNOSIS — Z1389 Encounter for screening for other disorder: Secondary | ICD-10-CM | POA: Diagnosis not present

## 2016-09-26 DIAGNOSIS — I10 Essential (primary) hypertension: Secondary | ICD-10-CM | POA: Diagnosis not present

## 2016-09-29 DIAGNOSIS — Z1212 Encounter for screening for malignant neoplasm of rectum: Secondary | ICD-10-CM | POA: Diagnosis not present

## 2016-12-03 DIAGNOSIS — E119 Type 2 diabetes mellitus without complications: Secondary | ICD-10-CM | POA: Diagnosis not present

## 2017-02-03 DIAGNOSIS — M859 Disorder of bone density and structure, unspecified: Secondary | ICD-10-CM | POA: Diagnosis not present

## 2017-02-09 DIAGNOSIS — E8770 Fluid overload, unspecified: Secondary | ICD-10-CM | POA: Diagnosis not present

## 2017-02-09 DIAGNOSIS — Z6836 Body mass index (BMI) 36.0-36.9, adult: Secondary | ICD-10-CM | POA: Diagnosis not present

## 2017-02-09 DIAGNOSIS — R05 Cough: Secondary | ICD-10-CM | POA: Diagnosis not present

## 2017-02-09 DIAGNOSIS — I872 Venous insufficiency (chronic) (peripheral): Secondary | ICD-10-CM | POA: Diagnosis not present

## 2017-02-09 DIAGNOSIS — I10 Essential (primary) hypertension: Secondary | ICD-10-CM | POA: Diagnosis not present

## 2017-02-09 NOTE — Progress Notes (Signed)
Cardiology Office Note:    Date:  02/10/2017   ID:  Kenneth [name], DOB 02/24/1951, MRN [id number]  PCP:  Crist Infante, MD  Cardiologist:  Dr. Candee Furbish  Referring MD: Crist Infante, MD   Chief Complaint  Patient presents with  . Leg Swelling    History of Present Illness:    Kenneth [name] is a 66 y.o. male with a past medical history significant for Diabetes type 2 on insulin, hyperlipidemia, hypertension, and neck fracture cervical spine.  The patient was first seen by Dr. Marlou Porch in 10/2015 for the evaluation of leg swelling and an episode of fluid overload while he was vacationing in [place]. While in [place] he had been given IV Lasix and antibiotic and started on blood pressure medication. He was then continued on lisinopril and Lasix. He had had a complaint of cough which was resolved by the time he was seen by Dr. Marlou Porch and thus not thought to be related to the ACE-I. Marland Kitchen Apparently he had forgotten to bring his benazepril and hydrochlorothiazide with him on vacation. Chest x-ray done went back home showed possible cardiomegaly. His BNP was normal. An echocardiogram done by Dr. Marlou Porch on 12/03/2015 showed normal LV systolic function with EF 55-60%, mildly dilated left atrium and no evidence of cardiomegaly. He has chronic lower extremity edema and has been advised to use compression stockings, elevate the lower extremities, limit sodium intake, exercise and work on weight loss.  The patient is here today alone. He was seen at his PCP office yesterday with complaints of cough that has improved with inhaler and prednisone but is still coughing with mucus. He is also fatigued and has increased edema. He was restarted on lasix and advised to wear compression stockings. A chest xray showed small pleural effusion vs scarring in the right base. He was sent here for further evaluation of possible fluid overload.   The patient states that he had gone on a 3 week cruise to [place] and returned on  9/28. A few days later he took his wife to the ED and then developed productive cough and congestion. He thinks that he got sick from the ED. He then developed difficulty breathing about a week ago, not related to exertion. This is new for him. He has lower extremity edema, L>R that is fairly chronic for the last couple of years, but is mildly worse at present. While on his cruise he did eat a lot of high sodium foods. His PCP put him on prednisone which he has just finished and inhalers with mild improvement. Yesterday he was started on an antibiotic. He has has had orthopnea for about 3 months, no PND. He denies any chest discomfort. He was also restarted on lasix about a week ago, but the edema but reports no improvement in edema or breathing. He continues to have a cough which interferes with sleep. He is coughing frequently while in exam room. His wt is unchanged since his previous appt and he denies abdominal fullness.   Of note he was diagnosed with OSA years ago with a sleep study and CPAP was recommended. His wife wears CPAP and the pt says that he is not going to wear it. He has never been a smoker and only drinks alcohol rarely, more on his cruise than usual.   Past Medical History:  Diagnosis Date  . Broken neck ([place])   . Diabetes mellitus   . 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  History of colon polyps - adenomas 04/03/2010  . Hyperlipidemia   . Hypertension   . OSA (obstructive sleep apnea)    refusing CPAP    Past Surgical History:  Procedure Laterality Date  . ANTERIOR CERVICAL DECOMP/DISCECTOMY FUSION N/A 10/01/2012   Procedure: ANTERIOR CERVICAL DECOMPRESSION/DISCECTOMY FUSION 2 LEVELS;  Surgeon: Eustace Moore, MD;  Location: [facility];  Service: Neurosurgery;  Laterality: N/A;  Cervical five-six,Cervical six-seven  . COLONOSCOPY  09/30/2010   diverticulosis, small internal hemorrhoids  . COLONOSCOPY W/ POLYPECTOMY  04/03/2010   4 polyps 8-78mm, worst TV adenoma with  high-grade dysplasia  . NO PAST SURGERIES    . POSTERIOR CERVICAL FUSION/FORAMINOTOMY Left 10/20/2012   Procedure: C/5-6,C/6-7 Lami/Multi level,POSTERIOR CERVICAL FUSION/FORAMINOTOMY C/4-7;  Surgeon: Eustace Moore, MD;  Location: [facility];  Service: Neurosurgery;  Laterality: Left;  Cervical Five-Six, Cervical Six-Seven Laminectomies, Posterior Cervical Fusion/Foraminotomies Cervical Four through Seven.   . TONSILLECTOMY      Current Medications: Current Meds  Medication Sig  . alendronate (FOSAMAX) 70 MG tablet Take 1 tablet by mouth once a week.  Marland Kitchen aspirin 81 MG tablet Take 81 mg by mouth daily.    Marland Kitchen atorvastatin (LIPITOR) 10 MG tablet Take 40 mg by mouth daily.   Marland Kitchen azithromycin (ZITHROMAX) 250 MG tablet   . B-D UF III MINI PEN NEEDLES 31G X 5 MM MISC Inject 1 Stick as directed as directed.  . calcium carbonate (OS-CAL) 600 MG TABS Take 600 mg by mouth 2 (two) times daily with a meal.    . FLOVENT HFA 220 MCG/ACT inhaler INL 1 PUFF BID RINSE MOUTH WITH WATER AFTER USE  . furosemide (LASIX) 40 MG tablet Take 40 mg by mouth.  . insulin aspart (NOVOLOG) 100 UNIT/ML injection Inject 10 Units into the skin 3 (three) times daily with meals.  . Insulin Glargine (TOUJEO MAX SOLOSTAR) 300 UNIT/ML SOPN Inject 45 Units into the skin every morning.  . metFORMIN (GLUCOPHAGE) 1000 MG tablet Take 1,000 mg by mouth 2 (two) times daily with a meal.    . NOVOLOG FLEXPEN 100 UNIT/ML FlexPen Inject 16 Units as directed 3 (three) times daily.  . potassium chloride (K-DUR) 10 MEQ tablet TK 1 T PO D IF LASIX T IS TAKEN  . [DISCONTINUED] benazepril-hydrochlorthiazide (LOTENSIN HCT) 20-25 MG per tablet Take 1 tablet by mouth daily.    . [DISCONTINUED] felodipine (PLENDIL) 5 MG 24 hr tablet Take 5 mg by mouth daily. ER     Allergies:   Penicillins   Social History   Social History  . Marital status: Married    Spouse name: N/A  . Number of children: N/A  . Years of education: N/A   Social History  Main Topics  . Smoking status: Former Smoker    Years: 2.00    Quit date: 09/22/1969  . Smokeless tobacco: Never Used  . Alcohol use No  . Drug use: No  . Sexual activity: No   Other Topics Concern  . None   Social History Narrative  . None     Family History: The patient's family history includes Diabetes in his mother. There is no history of Stomach cancer, Rectal cancer, Pancreatic cancer, or Colon cancer. ROS:   Please see the history of present illness.     All other systems reviewed and are negative.  EKGs/Labs/Other Studies Reviewed:    The following studies were reviewed today:  Echocardiogram 12/03/2015  Study Conclusions - Left ventricle: The cavity size was normal.  Wall thickness was   normal. Systolic function was normal. The estimated ejection   fraction was in the range of 55% to 60%. Left ventricular   diastolic function parameters were normal. - Left atrium: The atrium was mildly dilated.  EKG:  EKG is  ordered today.  The ekg ordered today demonstrates Sinus bradycardia at 59 bpm with no ischemic changes and no significant change from previous  Recent Labs: No results found for requested labs within last 8760 hours.   Recent Lipid Panel No results found for: CHOL, TRIG, HDL, CHOLHDL, VLDL, LDLCALC, LDLDIRECT  Physical Exam:    VS:  BP 140/72 (BP Location: Left Arm, Patient Position: Sitting, Cuff Size: Normal)   Pulse 70   Ht 5\' 11"  (1.803 m)   Wt 251 lb (113.9 kg)   SpO2 92%   BMI 35.01 kg/m     Wt Readings from Last 3 Encounters:  02/10/17 251 lb (113.9 kg)  11/09/15 250 lb (113.4 kg)  01/24/15 236 lb (107 kg)     Physical Exam  Constitutional: He is oriented to person, place, and time. He appears well-developed and well-nourished. No distress.  HENT:  Head: Normocephalic and atraumatic.  Neck: Normal range of motion. Neck supple. No JVD present.  Cardiovascular: Normal rate, regular rhythm and normal heart sounds.  Exam reveals no  gallop and no friction rub.   No murmur heard. Pulmonary/Chest: Effort normal and breath sounds normal. No respiratory distress.  Frequent cough during exam  Abdominal: Soft. Bowel sounds are normal. He exhibits no distension. There is no tenderness.  Musculoskeletal: Normal range of motion. He exhibits edema.  1+ pitting edema from mid calf down L>R  Neurological: He is alert and oriented to person, place, and time.  Skin: Skin is warm and dry.  Psychiatric: He has a normal mood and affect. His behavior is normal. Thought content normal.     ASSESSMENT:    1. Shortness of breath   2. Edema, unspecified type   3. Essential (primary) hypertension   4. OSA (obstructive sleep apnea)   5. Hyperlipidemia, unspecified hyperlipidemia type   6. Type 2 diabetes mellitus without complication, with long-term current use of insulin (HCC)    PLAN:    In order of problems listed above:  Shortness of breath: Pt with shortness of breath X 1 week. Is being treated for cough and congestion with steroids, inhalers and now on antibiotics. CXR at PCP yest showed small pleural effusion vs scarring in the right base. O2 sat 91-92%. Pt has had orthopnea for at least 3 months. No recent change in wt. No chest pain. Does not appear to be significantly fluid overloaded. Will check BNP and echocardiogram. Hopefully will improve with treatment of respiratory infection.   Lower extremity edema: Pt with chronic lower leg edema, possibly slightly worse since his 3 week cruise to [place], returned on 9/25. Will check CMet for liver function and renal function. Will check TSH. Current medications reviewed for possible contribution to LE edema. Will stop felodipine as causes LE edema in >10% of patients. Will increase benazepril to 40 mg and continue HCTZ 25 mg. Cannot add BB due to low HR.  Pt advised to wear compression stockings as prescribed by PCP and focus on eating lower sodium foods.   Hypertension: BP well  controlled. Due to edema will change meds as above. Will recheck BP in 1-2 weeks with our pharmacist.   OSA: Pt previously diagnosed years ago with sleep study and recommended  CPAP. Pt says he does not want to use CPAP. We discussed the detrimental effects of OSA on blood pressure, CAD, difficulty with wt loss and possible relation to his low O2 sat 91-92%. He says that he will think about having another sleep study.   Diabetes: on insulin. Followed by PCP.   Hyperlipidemia: Pt on atorvastatin 10 mg. Unknown lipid status. Will check FLP and liver function.    Medication Adjustments/Labs and Tests Ordered: Current medicines are reviewed at length with the patient today.  Concerns regarding medicines are outlined above. Labs and tests ordered and medication changes are outlined in the patient instructions below:  Patient Instructions  Medication Instructions:  Your physician has recommended you make the following change in your medication:  1.  STOP  Benazepril-Hctz 2.  STOP Feelodpine 3.  START Benazepril 40 mg daily 4.  START Hydrochlorothiazide 25 mg daily   Labwork: TODAY:  TSH, PRO BNP, CMET AT TIME OF ECHO:  FASTING LIPID  Testing/Procedures: .Your physician has requested that you have an echocardiogram. Echocardiography is a painless test that uses sound waves to create images of your heart. It provides your doctor with information about the size and shape of your heart and how well your heart's chambers and valves are working. This procedure takes approximately one hour. There are no restrictions for this procedure.    Follow-Up: Your physician recommends that you schedule a follow-up appointment in: 1-2 WEEKS WITH THE PHARM-D FOR BLOOD PRESSURE CHECK  Your physician recommends that you schedule a follow-up appointment in: [place] DR. Marlou Porch    Any Other Special Instructions Will Be Listed Below (If Applicable). Echocardiogram An echocardiogram, or echocardiography,  uses sound waves (ultrasound) to produce an image of your heart. The echocardiogram is simple, painless, obtained within a short period of time, and offers valuable information to your health care provider. The images from an echocardiogram can provide information such as:  Evidence of coronary artery disease (CAD).  Heart size.  Heart muscle function.  Heart valve function.  Aneurysm detection.  Evidence of a past heart attack.  Fluid buildup around the heart.  Heart muscle thickening.  Assess heart valve function.  Tell a health care provider about:  Any allergies you have.  All medicines you are taking, including vitamins, herbs, eye drops, creams, and over-the-counter medicines.  Any problems you or family members have had with anesthetic medicines.  Any blood disorders you have.  Any surgeries you have had.  Any medical conditions you have.  Whether you are pregnant or may be pregnant. What happens before the procedure? No special preparation is needed. Eat and drink normally. What happens during the procedure?  In order to produce an image of your heart, gel will be applied to your chest and a wand-like tool (transducer) will be moved over your chest. The gel will help transmit the sound waves from the transducer. The sound waves will harmlessly bounce off your heart to allow the heart images to be captured in real-time motion. These images will then be recorded.  You may need an IV to receive a medicine that improves the quality of the pictures. What happens after the procedure? You may return to your normal schedule including diet, activities, and medicines, unless your health care provider tells you otherwise. This information is not intended to replace advice given to you by your health care provider. Make sure you discuss any questions you have with your health care provider. Document Released: 04/25/2000 Document Revised:  12/15/2015 Document Reviewed:  01/03/2013 Elsevier Interactive Patient Education  2017 [employer].    If you need a refill on your cardiac medications before your next appointment, please call your pharmacy.      Signed, Daune Perch, NP  02/10/2017 1:34 PM    [place]

## 2017-02-10 ENCOUNTER — Ambulatory Visit (INDEPENDENT_AMBULATORY_CARE_PROVIDER_SITE_OTHER): Payer: Medicare Other | Admitting: Cardiology

## 2017-02-10 ENCOUNTER — Encounter: Payer: Self-pay | Admitting: Physician Assistant

## 2017-02-10 VITALS — BP 140/72 | HR 70 | Ht 71.0 in | Wt 251.0 lb

## 2017-02-10 DIAGNOSIS — Z794 Long term (current) use of insulin: Secondary | ICD-10-CM | POA: Diagnosis not present

## 2017-02-10 DIAGNOSIS — E782 Mixed hyperlipidemia: Secondary | ICD-10-CM | POA: Insufficient documentation

## 2017-02-10 DIAGNOSIS — R609 Edema, unspecified: Secondary | ICD-10-CM

## 2017-02-10 DIAGNOSIS — E119 Type 2 diabetes mellitus without complications: Secondary | ICD-10-CM | POA: Insufficient documentation

## 2017-02-10 DIAGNOSIS — G4733 Obstructive sleep apnea (adult) (pediatric): Secondary | ICD-10-CM | POA: Diagnosis not present

## 2017-02-10 DIAGNOSIS — I10 Essential (primary) hypertension: Secondary | ICD-10-CM

## 2017-02-10 DIAGNOSIS — E785 Hyperlipidemia, unspecified: Secondary | ICD-10-CM | POA: Diagnosis not present

## 2017-02-10 DIAGNOSIS — H9190 Unspecified hearing loss, unspecified ear: Secondary | ICD-10-CM | POA: Insufficient documentation

## 2017-02-10 DIAGNOSIS — R0602 Shortness of breath: Secondary | ICD-10-CM

## 2017-02-10 MED ORDER — BENAZEPRIL HCL 40 MG PO TABS: 40.0000 mg | ORAL_TABLET | Freq: Every day | ORAL | 3 refills | Status: DC

## 2017-02-10 MED ORDER — HYDROCHLOROTHIAZIDE 25 MG PO TABS: 25.0000 mg | ORAL_TABLET | Freq: Every day | ORAL | 3 refills | Status: DC

## 2017-02-10 NOTE — Patient Instructions (Addendum)
Medication Instructions:  Your physician has recommended you make the following change in your medication:  1.  STOP  Benazepril-Hctz 2.  STOP Feelodpine 3.  START Benazepril 40 mg daily 4.  START Hydrochlorothiazide 25 mg daily   Labwork: TODAY:  TSH, PRO BNP, CMET AT TIME OF ECHO:  FASTING LIPID  Testing/Procedures: .Your physician has requested that you have an echocardiogram. Echocardiography is a painless test that uses sound waves to create images of your heart. It provides your doctor with information about the size and shape of your heart and how well your heart's chambers and valves are working. This procedure takes approximately one hour. There are no restrictions for this procedure.    Follow-Up: Your physician recommends that you schedule a follow-up appointment in: 1-2 WEEKS WITH THE PHARM-D FOR BLOOD PRESSURE CHECK  Your physician recommends that you schedule a follow-up appointment in: [place] DR. Marlou Porch    Any Other Special Instructions Will Be Listed Below (If Applicable). Echocardiogram An echocardiogram, or echocardiography, uses sound waves (ultrasound) to produce an image of your heart. The echocardiogram is simple, painless, obtained within a short period of time, and offers valuable information to your health care provider. The images from an echocardiogram can provide information such as:  Evidence of coronary artery disease (CAD).  Heart size.  Heart muscle function.  Heart valve function.  Aneurysm detection.  Evidence of a past heart attack.  Fluid buildup around the heart.  Heart muscle thickening.  Assess heart valve function.  Tell a health care provider about:  Any allergies you have.  All medicines you are taking, including vitamins, herbs, eye drops, creams, and over-the-counter medicines.  Any problems you or family members have had with anesthetic medicines.  Any blood disorders you have.  Any surgeries you have  had.  Any medical conditions you have.  Whether you are pregnant or may be pregnant. What happens before the procedure? No special preparation is needed. Eat and drink normally. What happens during the procedure?  In order to produce an image of your heart, gel will be applied to your chest and a wand-like tool (transducer) will be moved over your chest. The gel will help transmit the sound waves from the transducer. The sound waves will harmlessly bounce off your heart to allow the heart images to be captured in real-time motion. These images will then be recorded.  You may need an IV to receive a medicine that improves the quality of the pictures. What happens after the procedure? You may return to your normal schedule including diet, activities, and medicines, unless your health care provider tells you otherwise. This information is not intended to replace advice given to you by your health care provider. Make sure you discuss any questions you have with your health care provider. Document Released: 04/25/2000 Document Revised: 12/15/2015 Document Reviewed: 01/03/2013 Elsevier Interactive Patient Education  2017 [employer].    If you need a refill on your cardiac medications before your next appointment, please call your pharmacy.

## 2017-02-11 ENCOUNTER — Telehealth: Payer: Self-pay | Admitting: *Deleted

## 2017-02-11 LAB — COMPREHENSIVE METABOLIC PANEL
ALT: 23 IU/L (ref 0–44)
AST: 17 IU/L (ref 0–40)
Albumin/Globulin Ratio: 1.8 (ref 1.2–2.2)
Albumin: 4.4 g/dL (ref 3.6–4.8)
Alkaline Phosphatase: 58 IU/L (ref 39–117)
BUN/Creatinine Ratio: 16 (ref 10–24)
BUN: 14 mg/dL (ref 8–27)
Bilirubin Total: 0.3 mg/dL (ref 0.0–1.2)
CALCIUM: 10.3 mg/dL — AB (ref 8.6–10.2)
CO2: 30 mmol/L — AB (ref 20–29)
CREATININE: 0.85 mg/dL (ref 0.76–1.27)
Chloride: 96 mmol/L (ref 96–106)
GFR calc Af Amer: 103 mL/min/{1.73_m2} (ref >59)
GFR, EST NON AFRICAN AMERICAN: 90 mL/min/{1.73_m2} (ref >59)
GLOBULIN, TOTAL: 2.4 g/dL (ref 1.5–4.5)
Glucose: 135 mg/dL — ABNORMAL HIGH (ref 65–99)
Potassium: 4.1 mmol/L (ref 3.5–5.2)
SODIUM: 140 mmol/L (ref 134–144)
Total Protein: 6.8 g/dL (ref 6.0–8.5)

## 2017-02-11 LAB — LIPID PANEL
CHOL/HDL RATIO: 2.4 ratio (ref 0.0–5.0)
Cholesterol, Total: 159 mg/dL (ref 100–199)
HDL: 67 mg/dL (ref >39)
LDL CALC: 58 mg/dL (ref 0–99)
TRIGLYCERIDES: 169 mg/dL — AB (ref 0–149)
VLDL CHOLESTEROL CAL: 34 mg/dL (ref 5–40)

## 2017-02-11 LAB — TSH: TSH: 2.48 u[IU]/mL (ref 0.450–4.500)

## 2017-02-11 LAB — PRO B NATRIURETIC PEPTIDE: NT-Pro BNP: 36 pg/mL (ref 0–376)

## 2017-02-11 NOTE — Telephone Encounter (Signed)
-----   Message from Daune Perch, NP sent at 02/11/2017  7:38 AM EDT ----- Please call pt to inform him that his labs looked good. His kidney and liver function are normal. His cholesterol is good, the bad cholesterol, LDL, is 58 which is very good. His BNP was normal indicating that his swelling is very likely not from heart failure. He should continue his statin and all current medical therapy. The echo will give [place] even more information.  Daune Perch, NP

## 2017-02-11 NOTE — Telephone Encounter (Signed)
Patient informed and verbalized understanding of plan. 

## 2017-02-13 ENCOUNTER — Other Ambulatory Visit: Payer: Self-pay

## 2017-02-13 ENCOUNTER — Other Ambulatory Visit: Payer: Medicare Other

## 2017-02-13 ENCOUNTER — Ambulatory Visit (HOSPITAL_COMMUNITY): Payer: Medicare Other | Attending: Cardiovascular Disease

## 2017-02-13 DIAGNOSIS — G4733 Obstructive sleep apnea (adult) (pediatric): Secondary | ICD-10-CM | POA: Diagnosis not present

## 2017-02-13 DIAGNOSIS — Z87891 Personal history of nicotine dependence: Secondary | ICD-10-CM | POA: Diagnosis not present

## 2017-02-13 DIAGNOSIS — I10 Essential (primary) hypertension: Secondary | ICD-10-CM

## 2017-02-13 DIAGNOSIS — R609 Edema, unspecified: Secondary | ICD-10-CM | POA: Diagnosis not present

## 2017-02-13 DIAGNOSIS — I071 Rheumatic tricuspid insufficiency: Secondary | ICD-10-CM | POA: Insufficient documentation

## 2017-02-13 DIAGNOSIS — I119 Hypertensive heart disease without heart failure: Secondary | ICD-10-CM | POA: Insufficient documentation

## 2017-02-13 DIAGNOSIS — E785 Hyperlipidemia, unspecified: Secondary | ICD-10-CM | POA: Diagnosis not present

## 2017-02-16 ENCOUNTER — Telehealth: Payer: Self-pay

## 2017-02-16 NOTE — Telephone Encounter (Signed)
spoke with patient about recent lab results.  patient verbalized understanding.  will forward results to Dr. Joylene Draft.

## 2017-02-19 ENCOUNTER — Ambulatory Visit (INDEPENDENT_AMBULATORY_CARE_PROVIDER_SITE_OTHER): Payer: Medicare Other | Admitting: Pharmacist

## 2017-02-19 VITALS — BP 150/72 | HR 66 | Wt 251.8 lb

## 2017-02-19 DIAGNOSIS — I10 Essential (primary) hypertension: Secondary | ICD-10-CM | POA: Diagnosis not present

## 2017-02-19 NOTE — Patient Instructions (Addendum)
Return for a  follow up appointment in 1 week for blood work, 4 weeks for blood pressure check  Your blood pressure today is 150/72 pulse 66  Check your blood pressure at home daily (if able) and keep record of the readings.  Take your meds as follows: RESUME VENTOLIN AND FLOVENT INHALERS FOR ANOTHER WEEK (STILL WHEEZING) RESUME FUROSEMIDE AT 20MG  DAILY REPEAT BLOOD WORK IN 1 WEEK AT [facility] OFFICE  Bring your BP cuff and your record of home blood pressures to your next appointment.  Exercise as you're able, try to walk approximately 30 minutes per day.  Keep salt intake to a minimum, especially watch canned and prepared boxed foods.  Eat more fresh fruits and vegetables and fewer canned items.  Avoid eating in fast food restaurants.    HOW TO TAKE YOUR BLOOD PRESSURE: . Rest 5 minutes before taking your blood pressure. .  Don't smoke or drink caffeinated beverages for at least 30 minutes before. . Take your blood pressure before (not after) you eat. . Sit comfortably with your back supported and both feet on the floor (don't cross your legs). . Elevate your arm to heart level on a table or a desk. . Use the proper sized cuff. It should fit smoothly and snugly around your bare upper arm. There should be enough room to slip a fingertip under the cuff. The bottom edge of the cuff should be 1 inch above the crease of the elbow. . Ideally, take 3 measurements at one sitting and record the average.

## 2017-02-19 NOTE — Progress Notes (Signed)
Patient ID: Kenneth [name]                 DOB: 09-18-1950                      MRN: [id number]     HPI: Kenneth [name] is a 66 y.o. male patient of Dr. Marlou Porch referred to HTN clinic by Kenneth [name]. PMH includes hyperlipidemia, DM-II, hypertension, and neck fracture. Patient report increased edema after trip to [place]. During most recent office visit Benazepril/HCTZ and felodipine were stopped. Benazepril 40mg  daily and HCTZ 25mg  daily were started for BP and edema management.  Patient reports he stopped taking daily furosemide since Saturday and is feeling a lot better. His low extremity edema is greatly improved after discontinuing felodipine therapy. Patient does not weight himself at home nor monitor BP at home either. Denies increased fatigue, headaches or dizziness.  Current HTN meds:  Benazepril 40mg  daily Furosemide 40mg  daily - not taking since Saturday HCTZ 25mg  daily  Previously tried: none  BP goal: 130/80  Family History:  family history includes Diabetes in his mother. There is no history of Stomach cancer, Rectal cancer, Pancreatic cancer, or Colon cancer.  Social History: Denies tobacco use or alcohol use  Diet: low sodium diet  Exercise: daily exercise at the gym. None recently due to upper respiratory infection and persistent cough  Home BP readings: none available  Wt Readings from Last 3 Encounters:  02/19/17 251 lb 12.8 oz (114.2 kg)  02/10/17 251 lb (113.9 kg)  11/09/15 250 lb (113.4 kg)   BP Readings from Last 3 Encounters:  02/19/17 (!) 150/72  02/10/17 140/72  11/09/15 (!) 144/58   Pulse Readings from Last 3 Encounters:  02/19/17 66  02/10/17 70  11/09/15 65    Past Medical History:  Diagnosis Date  . Broken neck ([place])   . Diabetes mellitus   . 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 History of colon polyps - adenomas 04/03/2010  . Hyperlipidemia   . Hypertension   . OSA (obstructive sleep apnea)    refusing CPAP    Current  Outpatient Prescriptions on File Prior to Visit  Medication Sig Dispense Refill  . alendronate (FOSAMAX) 70 MG tablet Take 1 tablet by mouth once a week.    Marland Kitchen aspirin 81 MG tablet Take 81 mg by mouth daily.      Marland Kitchen atorvastatin (LIPITOR) 10 MG tablet Take 40 mg by mouth daily.     . B-D UF III MINI PEN NEEDLES 31G X 5 MM MISC Inject 1 Stick as directed as directed.    . benazepril (LOTENSIN) 40 MG tablet Take 1 tablet (40 mg total) by mouth daily. 90 tablet 3  . calcium carbonate (OS-CAL) 600 MG TABS Take 600 mg by mouth 2 (two) times daily with a meal.      . hydrochlorothiazide (HYDRODIURIL) 25 MG tablet Take 1 tablet (25 mg total) by mouth daily. 90 tablet 3  . Insulin Glargine (TOUJEO MAX SOLOSTAR) 300 UNIT/ML SOPN Inject 45 Units into the skin every morning.    . metFORMIN (GLUCOPHAGE) 1000 MG tablet Take 1,000 mg by mouth 2 (two) times daily with a meal.      . NOVOLOG FLEXPEN 100 UNIT/ML FlexPen Inject 16 Units as directed 3 (three) times daily.  11  . azithromycin (ZITHROMAX) 250 MG tablet     . FLOVENT HFA 220 MCG/ACT inhaler INL 1 PUFF BID  RINSE MOUTH WITH WATER AFTER USE  2  . furosemide (LASIX) 40 MG tablet Take 20 mg by mouth.    . potassium chloride (K-DUR) 10 MEQ tablet TK 1 T PO D IF LASIX T IS TAKEN  6   No current facility-administered medications on file prior to visit.     Allergies  Allergen Reactions  . Penicillins     As child/mother is allergic/pt does not take    Blood pressure (!) 150/72, pulse 66, weight 251 lb 12.8 oz (114.2 kg), SpO2 95 %.  Essential (primary) hypertension Blood pressure not controlled today and no home BP readings are available for assessment. Felodipine was discontinued 10 days ago due to edema and pateint reports great improvement since. Weight remains unchanged since last office visit on 02/10/2017.   He also stopped taking furosemide since Saturday.  Will resume furosemide at 20mg  daily and repeat BMET in 1 week. Patient instructed to  monitor BP twice daily and bring records to f/u appointment in 4 weeks.   Kenneth [name] PharmD, BCPS, [place] [id number] 03/11/2017 11:03 AM

## 2017-02-19 NOTE — Assessment & Plan Note (Addendum)
Blood pressure not controlled today and no home BP readings are available for assessment. Felodipine was discontinued 10 days ago due to edema and pateint reports great improvement since. Weight remains unchanged since last office visit on 02/10/2017.   He also stopped taking furosemide since Saturday.  Will resume furosemide at 20mg  daily and repeat BMET in 1 week. Patient instructed to monitor BP twice daily and bring records to f/u appointment in 4 weeks.

## 2017-02-23 DIAGNOSIS — Z6836 Body mass index (BMI) 36.0-36.9, adult: Secondary | ICD-10-CM | POA: Diagnosis not present

## 2017-02-23 DIAGNOSIS — R918 Other nonspecific abnormal finding of lung field: Secondary | ICD-10-CM | POA: Diagnosis not present

## 2017-02-23 DIAGNOSIS — I10 Essential (primary) hypertension: Secondary | ICD-10-CM | POA: Diagnosis not present

## 2017-02-23 DIAGNOSIS — J90 Pleural effusion, not elsewhere classified: Secondary | ICD-10-CM | POA: Diagnosis not present

## 2017-02-23 DIAGNOSIS — R05 Cough: Secondary | ICD-10-CM | POA: Diagnosis not present

## 2017-02-24 ENCOUNTER — Other Ambulatory Visit: Payer: Self-pay | Admitting: Internal Medicine

## 2017-02-24 DIAGNOSIS — R9389 Abnormal findings on diagnostic imaging of other specified body structures: Secondary | ICD-10-CM

## 2017-02-24 DIAGNOSIS — J90 Pleural effusion, not elsewhere classified: Secondary | ICD-10-CM

## 2017-02-25 DIAGNOSIS — Z23 Encounter for immunization: Secondary | ICD-10-CM | POA: Diagnosis not present

## 2017-02-27 ENCOUNTER — Ambulatory Visit
Admission: RE | Admit: 2017-02-27 | Discharge: 2017-02-27 | Disposition: A | Discharge status: Home / Self Care | Payer: Medicare Other | Source: Ambulatory Visit | Attending: Internal Medicine | Admitting: Internal Medicine

## 2017-02-27 DIAGNOSIS — J90 Pleural effusion, not elsewhere classified: Secondary | ICD-10-CM | POA: Diagnosis not present

## 2017-02-27 DIAGNOSIS — R9389 Abnormal findings on diagnostic imaging of other specified body structures: Secondary | ICD-10-CM

## 2017-02-27 MED ORDER — IOPAMIDOL (ISOVUE-300) INJECTION 61%
75.0000 mL | Freq: Once | INTRAVENOUS | Status: AC | PRN
  Administered 2017-02-27: 75 mL via INTRAVENOUS

## 2017-03-09 ENCOUNTER — Other Ambulatory Visit: Payer: Self-pay | Admitting: Internal Medicine

## 2017-03-11 ENCOUNTER — Encounter: Payer: Self-pay | Admitting: Pharmacist

## 2017-03-19 ENCOUNTER — Ambulatory Visit: Payer: Medicare Other

## 2017-03-20 ENCOUNTER — Ambulatory Visit (INDEPENDENT_AMBULATORY_CARE_PROVIDER_SITE_OTHER): Payer: Medicare Other | Admitting: Pharmacist

## 2017-03-20 VITALS — BP 150/82 | HR 60

## 2017-03-20 DIAGNOSIS — I10 Essential (primary) hypertension: Secondary | ICD-10-CM

## 2017-03-20 MED ORDER — CHLORTHALIDONE 25 MG PO TABS
25.0000 mg | ORAL_TABLET | Freq: Every day | ORAL | 1 refills | Status: DC
Start: 2017-03-20 — End: 2017-04-23

## 2017-03-20 NOTE — Progress Notes (Signed)
Patient ID: Kenneth [name]                 DOB: [birth year]                      MRN: [id number]     HPI: Kenneth [name] is a 66 y.o. male patient of Dr. Marlou [name] referred to HTN clinic by J Hammond-PA. PMH includes hyperlipidemia, DM-II, hypertension, and neck fracture. Patient report increased edema after trip to [place].  During last OV, patient was instructed to resume furosemide 40mg  as previously ordered by cardiologist and repeat BMET in 1 week.  Patient presents today and reports he stopped taking his furosemide completely due to frequent urination and did NOT repeat blood work as previously ordered. Denies dizziness, headaches, increased fatigue or chest pain. Repots low extremity swelling unchanged since last visit. I noticed some wheezing but patient also has chronic asthma and is not using his inhaler as prescribed. He stopped all inhalers ~ 2 weeks ago.  Current HTN meds:  Benazepril 40mg  daily HCTZ 25mg  daily  Previously tried: none  BP goal: 130/80 (patient afraid on number < 354 systolic)  Family History:  family history includes Diabetes in his mother. There is no history of Stomach cancer, Rectal cancer, Pancreatic cancer, or Colon cancer.  Social History: Denies tobacco use or alcohol use  Diet: low sodium diet  Exercise: daily exercise at the gym. None recently due to upper respiratory infection and persistent cough  Home BP readings:none provided   Wt Readings from Last 3 Encounters:  02/19/17 251 lb 12.8 oz (114.2 kg)  02/10/17 251 lb (113.9 kg)  11/09/15 250 lb (113.4 kg)   BP Readings from Last 3 Encounters:  03/20/17 (!) 150/82  02/19/17 (!) 150/72  02/10/17 140/72   Pulse Readings from Last 3 Encounters:  03/20/17 60  02/19/17 66  02/10/17 70    Past Medical History:  Diagnosis Date  . Broken neck ([place])   . Diabetes mellitus   . 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 History of colon polyps - adenomas 04/03/2010  . Hyperlipidemia   .  Hypertension   . OSA (obstructive sleep apnea)    refusing CPAP    Current Outpatient Medications on File Prior to Visit  Medication Sig Dispense Refill  . albuterol (PROVENTIL HFA;VENTOLIN HFA) 108 (90 Base) MCG/ACT inhaler Inhale 2 puffs into the lungs every 6 (six) hours as needed for wheezing or shortness of breath.    Marland Kitchen alendronate (FOSAMAX) 70 MG tablet Take 1 tablet by mouth once a week.    Marland Kitchen aspirin 81 MG tablet Take 81 mg by mouth daily.      Marland Kitchen atorvastatin (LIPITOR) 10 MG tablet Take 40 mg by mouth daily.     . B-D UF III MINI PEN NEEDLES 31G X 5 MM MISC Inject 1 Stick as directed as directed.    . benazepril (LOTENSIN) 40 MG tablet Take 1 tablet (40 mg total) by mouth daily. 90 tablet 3  . calcium carbonate (OS-CAL) 600 MG TABS Take 600 mg by mouth 2 (two) times daily with a meal.      . FLOVENT HFA 220 MCG/ACT inhaler INL 1 PUFF BID RINSE MOUTH WITH WATER AFTER USE  2  . Insulin Glargine (TOUJEO MAX SOLOSTAR) 300 UNIT/ML SOPN Inject 45 Units into the skin every morning.    . metFORMIN (GLUCOPHAGE) 1000 MG tablet Take 1,000 mg by mouth 2 (  two) times daily with a meal.      . NOVOLOG FLEXPEN 100 UNIT/ML FlexPen Inject 16 Units as directed 3 (three) times daily.  11  . potassium chloride (K-DUR) 10 MEQ tablet TK 1 T PO D IF LASIX T IS TAKEN  6  . furosemide (LASIX) 40 MG tablet Take 20 mg by mouth.     No current facility-administered medications on file prior to visit.     Allergies  Allergen Reactions  . Penicillins     As child/mother is allergic/pt does not take    Blood pressure (!) 150/82, pulse 60, SpO2 94 %.  Essential (primary) hypertension BP was taken ~ 4 minutes after quietly sitting in room but remains above goal BP of <130/80. Patient stopped taking furosemide ,without doctor's authorization, due to frequent urination.  No records from home BP readings are available today either for assessment and no significant changes in lifestyle noted since last visit.   Significant about of time was spent talking about medication compliance and importance of appropriate monitoring.  Will change HCTZ 25mg  to chlorthalidone 25mg  daily, repeat BMET in 10 days, work on lifestyle modifications, and follow up in 4 weeks at HTN clinic.  Patient is to monitor BP 3-4 times per week and bring records to next f/u visit for further assessment.   Kenneth [name] PharmD, BCPS, [place] [address] [place] [address] 03/23/2017 7:59 AM

## 2017-03-20 NOTE — Patient Instructions (Addendum)
Return for a follow up appointment in 4 weeks (blood work in 2 weeks)  Your blood pressure today is 150/82 pulse 60  Check your blood pressure at home daily (if able) and keep record of the readings.  Take your BP meds as follows: STOP taking HCTZ 25mg  START taking chlorthalidone 25mg  daily Repeat blood work 10-14 days after starting chlorthalidone  *USE Flovent every day* *USE albuterol every 6 hours as needed for shortness of breath*  Bring your BP cuff and your record of home blood pressures to your next appointment.  Exercise as you're able, try to walk approximately 30 minutes per day.  Keep salt intake to a minimum, especially watch canned and prepared boxed foods.  Eat more fresh fruits and vegetables and fewer canned items.  Avoid eating in fast food restaurants.    HOW TO TAKE YOUR BLOOD PRESSURE: . Rest 5 minutes before taking your blood pressure. .  Don't smoke or drink caffeinated beverages for at least 30 minutes before. . Take your blood pressure before (not after) you eat. . Sit comfortably with your back supported and both feet on the floor (don't cross your legs). . Elevate your arm to heart level on a table or a desk. . Use the proper sized cuff. It should fit smoothly and snugly around your bare upper arm. There should be enough room to slip a fingertip under the cuff. The bottom edge of the cuff should be 1 inch above the crease of the elbow. . Ideally, take 3 measurements at one sitting and record the average.

## 2017-03-23 ENCOUNTER — Encounter: Payer: Self-pay | Admitting: Pharmacist

## 2017-03-23 NOTE — Assessment & Plan Note (Signed)
BP was taken ~ 4 minutes after quietly sitting in room but remains above goal BP of <130/80. Patient stopped taking furosemide ,without doctor's authorization, due to frequent urination.  No records from home BP readings are available today either for assessment and no significant changes in lifestyle noted since last visit.  Significant about of time was spent talking about medication compliance and importance of appropriate monitoring.  Will change HCTZ 25mg  to chlorthalidone 25mg  daily, repeat BMET in 10 days, work on lifestyle modifications, and follow up in 4 weeks at HTN clinic.  Patient is to monitor BP 3-4 times per week and bring records to next f/u visit for further assessment.

## 2017-03-24 DIAGNOSIS — I10 Essential (primary) hypertension: Secondary | ICD-10-CM | POA: Diagnosis not present

## 2017-03-24 DIAGNOSIS — Z6836 Body mass index (BMI) 36.0-36.9, adult: Secondary | ICD-10-CM | POA: Diagnosis not present

## 2017-03-24 DIAGNOSIS — R972 Elevated prostate specific antigen [PSA]: Secondary | ICD-10-CM | POA: Diagnosis not present

## 2017-03-24 DIAGNOSIS — R062 Wheezing: Secondary | ICD-10-CM | POA: Diagnosis not present

## 2017-03-24 DIAGNOSIS — E1129 Type 2 diabetes mellitus with other diabetic kidney complication: Secondary | ICD-10-CM | POA: Diagnosis not present

## 2017-04-06 DIAGNOSIS — I10 Essential (primary) hypertension: Secondary | ICD-10-CM | POA: Diagnosis not present

## 2017-04-07 LAB — BASIC METABOLIC PANEL
BUN/Creatinine Ratio: 16 (ref 10–24)
BUN: 13 mg/dL (ref 8–27)
CALCIUM: 9.6 mg/dL (ref 8.6–10.2)
CO2: 27 mmol/L (ref 20–29)
CREATININE: 0.82 mg/dL (ref 0.76–1.27)
Chloride: 96 mmol/L (ref 96–106)
GFR calc Af Amer: 105 mL/min/{1.73_m2} (ref >59)
GFR calc non Af Amer: 91 mL/min/{1.73_m2} (ref >59)
GLUCOSE: 161 mg/dL — AB (ref 65–99)
Potassium: 4.5 mmol/L (ref 3.5–5.2)
SODIUM: 142 mmol/L (ref 134–144)

## 2017-04-21 ENCOUNTER — Ambulatory Visit: Payer: Medicare Other

## 2017-04-22 NOTE — Progress Notes (Signed)
Patient ID: Kenneth [name]                 DOB: [birth year]                      MRN: [id number]     HPI: Kenneth [name] is a 66 y.o. [name] patient of Dr. Marlou Porch referred to HTN clinic by J Hammond-PA. PMH includes hyperlipidemia, DM-II, hypertension, and prior neck fracture.  He has been seen several times in the past couple of months for hypertension management.  Despite switching his hydrochlorothiazide to chlorthalidone, his pressure remains elevated in the office.   He states that outside of our office he feels that his pressure is "good", but he does not have a home cuff to validate these feelings.    Patient presents today and denies dizziness, headaches, increased fatigue, chest pain or lower extremity edema.  He takes his furosemide/potassium only as needed.  He states he has been encouraged to do a sleep study, but since he refuses to uses a CPAP at night he has not had this done.    Current HTN meds:  Benazepril 40mg  daily Chlorthalidone 25 mg daily  Previously tried:  hctz - switched out to chlorthalidone for better control  BP goal: 130/80 (patient afraid on number < 401 systolic)  Family History:  family history includes Diabetes in his mother. There is no history of Stomach cancer, Rectal cancer, Pancreatic cancer, or Colon cancer.  Social History: Denies tobacco use or alcohol use  Diet: low sodium diet  Exercise: daily exercise at the gym. None recently due to upper respiratory infection and persistent cough  Home BP readings:none provided   Wt Readings from Last 3 Encounters:  02/19/17 251 lb 12.8 oz (114.2 kg)  02/10/17 251 lb (113.9 kg)  11/09/15 250 lb (113.4 kg)   BP Readings from Last 3 Encounters:  04/23/17 (!) 152/76  03/20/17 (!) 150/82  02/19/17 (!) 150/72   Pulse Readings from Last 3 Encounters:  04/23/17 60  03/20/17 60  02/19/17 66    Past Medical History:  Diagnosis Date  . Broken neck ([place])   . Diabetes mellitus   . Hearing loss of both  ears    Since birth  . History of colon polyps - adenomas 04/03/2010  . Hyperlipidemia   . Hypertension   . OSA (obstructive sleep apnea)    refusing CPAP    Current Outpatient Medications on File Prior to Visit  Medication Sig Dispense Refill  . albuterol (PROVENTIL HFA;VENTOLIN HFA) 108 (90 Base) MCG/ACT inhaler Inhale 2 puffs into the lungs every 6 (six) hours as needed for wheezing or shortness of breath.    Marland Kitchen alendronate (FOSAMAX) 70 MG tablet Take 1 tablet by mouth once a week.    Marland Kitchen aspirin 81 MG tablet Take 81 mg by mouth daily.      Marland Kitchen atorvastatin (LIPITOR) 10 MG tablet Take 40 mg by mouth daily.     . B-D UF III MINI PEN NEEDLES 31G X 5 MM MISC Inject 1 Stick as directed as directed.    . benazepril (LOTENSIN) 40 MG tablet Take 1 tablet (40 mg total) by mouth daily. 90 tablet 3  . calcium carbonate (OS-CAL) 600 MG TABS Take 600 mg by mouth 2 (two) times daily with a meal.      . chlorthalidone (HYGROTON) 25 MG tablet Take 1 tablet (25 mg total) daily by mouth. Replace HCTZ 30 tablet 1  .  FLOVENT HFA 220 MCG/ACT inhaler INL 1 PUFF BID RINSE MOUTH WITH WATER AFTER USE  2  . furosemide (LASIX) 40 MG tablet Take 20 mg by mouth.    . Insulin Glargine (TOUJEO MAX SOLOSTAR) 300 UNIT/ML SOPN Inject 45 Units into the skin every morning.    . metFORMIN (GLUCOPHAGE) 1000 MG tablet Take 1,000 mg by mouth 2 (two) times daily with a meal.      . NOVOLOG FLEXPEN 100 UNIT/ML FlexPen Inject 16 Units as directed 3 (three) times daily.  11  . potassium chloride (K-DUR) 10 MEQ tablet TK 1 T PO D IF LASIX T IS TAKEN  6   No current facility-administered medications on file prior to visit.     Allergies  Allergen Reactions  . Penicillins     As child/mother is allergic/pt does not take    Blood pressure (!) 152/76, pulse 60.  Essential (primary) hypertension Patient with essential hypertension, still not well controlled on benazepril and chlorthalidone.   Advised patient that we should start  amlodipine to help lower pressure further.  Patient is adamant about not taking any more medications at this time.  Explained blood pressure goals and concerns for continuing elevated readings.   Patient is willing to buy a new cuff and bring it by the office for calibration.   Then he can test regularly at home and let us know if his pressures are better there than here.   Patient agreeable to that.     Tommy Medal PharmD CPP [facility] [address] [place] [address] 04/23/2017 11:09 AM

## 2017-04-23 ENCOUNTER — Ambulatory Visit (INDEPENDENT_AMBULATORY_CARE_PROVIDER_SITE_OTHER): Payer: Medicare Other | Admitting: Pharmacist Clinician (PhC)/ Clinical Pharmacy Specialist

## 2017-04-23 VITALS — BP 152/76 | HR 60

## 2017-04-23 DIAGNOSIS — I10 Essential (primary) hypertension: Secondary | ICD-10-CM

## 2017-04-23 MED ORDER — CHLORTHALIDONE 25 MG PO TABS: 25.0000 mg | ORAL_TABLET | Freq: Every day | ORAL | 3 refills | Status: DC

## 2017-04-23 NOTE — Patient Instructions (Addendum)
Purchase a new blood pressure cuff (we like [employer] brand, but any will do - arm not wrist).  Call in the next 2-3 weeks and we will arrange to have you come by the office to verify its accuracy  Your blood pressure today is 152/76 (goal is <130/80)  Check your blood pressure at home 3-4 times per week and keep record of the readings.  Take your BP meds as follows:  Continue with all current medications  Bring all of your meds, your BP cuff and your record of home blood pressures to your next appointment.  Exercise as you're able, try to walk approximately 30 minutes per day.  Keep salt intake to a minimum, especially watch canned and prepared boxed foods.  Eat more fresh fruits and vegetables and fewer canned items.  Avoid eating in fast food restaurants.    HOW TO TAKE YOUR BLOOD PRESSURE: . Rest 5 minutes before taking your blood pressure. .  Don't smoke or drink caffeinated beverages for at least 30 minutes before. . Take your blood pressure before (not after) you eat. . Sit comfortably with your back supported and both feet on the floor (don't cross your legs). . Elevate your arm to heart level on a table or a desk. . Use the proper sized cuff. It should fit smoothly and snugly around your bare upper arm. There should be enough room to slip a fingertip under the cuff. The bottom edge of the cuff should be 1 inch above the crease of the elbow. . Ideally, take 3 measurements at one sitting and record the average.

## 2017-04-23 NOTE — Assessment & Plan Note (Signed)
Patient with essential hypertension, still not well controlled on benazepril and chlorthalidone.   Advised patient that we should start amlodipine to help lower pressure further.  Patient is adamant about not taking any more medications at this time.  Explained blood pressure goals and concerns for continuing elevated readings.   Patient is willing to buy a new cuff and bring it by the office for calibration.   Then he can test regularly at home and let us know if his pressures are better there than here.   Patient agreeable to that.

## 2017-06-12 ENCOUNTER — Telehealth: Payer: Self-pay | Admitting: Cardiology

## 2017-06-23 DIAGNOSIS — Z6835 Body mass index (BMI) 35.0-35.9, adult: Secondary | ICD-10-CM | POA: Diagnosis not present

## 2017-06-23 DIAGNOSIS — Z794 Long term (current) use of insulin: Secondary | ICD-10-CM | POA: Diagnosis not present

## 2017-06-23 DIAGNOSIS — I10 Essential (primary) hypertension: Secondary | ICD-10-CM | POA: Diagnosis not present

## 2017-06-23 DIAGNOSIS — E1129 Type 2 diabetes mellitus with other diabetic kidney complication: Secondary | ICD-10-CM | POA: Diagnosis not present

## 2017-06-25 DIAGNOSIS — E7849 Other hyperlipidemia: Secondary | ICD-10-CM | POA: Diagnosis not present

## 2017-06-25 DIAGNOSIS — Z6835 Body mass index (BMI) 35.0-35.9, adult: Secondary | ICD-10-CM | POA: Diagnosis not present

## 2017-06-25 DIAGNOSIS — I10 Essential (primary) hypertension: Secondary | ICD-10-CM | POA: Diagnosis not present

## 2017-06-25 DIAGNOSIS — E1129 Type 2 diabetes mellitus with other diabetic kidney complication: Secondary | ICD-10-CM | POA: Diagnosis not present

## 2017-07-23 ENCOUNTER — Emergency Department (HOSPITAL_COMMUNITY): Payer: Medicare Other

## 2017-07-23 ENCOUNTER — Inpatient Hospital Stay (HOSPITAL_COMMUNITY)
Admission: EM | Admit: 2017-07-23 | Discharge: 2017-07-25 | DRG: 690 | Disposition: A | Discharge status: Home / Self Care | Payer: Medicare Other | Attending: Internal Medicine | Admitting: Internal Medicine

## 2017-07-23 ENCOUNTER — Encounter (HOSPITAL_COMMUNITY): Payer: Self-pay

## 2017-07-23 ENCOUNTER — Other Ambulatory Visit: Payer: Self-pay

## 2017-07-23 DIAGNOSIS — N390 Urinary tract infection, site not specified: Secondary | ICD-10-CM | POA: Diagnosis not present

## 2017-07-23 DIAGNOSIS — Z9089 Acquired absence of other organs: Secondary | ICD-10-CM

## 2017-07-23 DIAGNOSIS — J90 Pleural effusion, not elsewhere classified: Secondary | ICD-10-CM | POA: Diagnosis not present

## 2017-07-23 DIAGNOSIS — R55 Syncope and collapse: Secondary | ICD-10-CM | POA: Diagnosis not present

## 2017-07-23 DIAGNOSIS — E1165 Type 2 diabetes mellitus with hyperglycemia: Secondary | ICD-10-CM | POA: Diagnosis not present

## 2017-07-23 DIAGNOSIS — H903 Sensorineural hearing loss, bilateral: Secondary | ICD-10-CM | POA: Diagnosis present

## 2017-07-23 DIAGNOSIS — Z6833 Body mass index (BMI) 33.0-33.9, adult: Secondary | ICD-10-CM

## 2017-07-23 DIAGNOSIS — I4581 Long QT syndrome: Secondary | ICD-10-CM | POA: Diagnosis present

## 2017-07-23 DIAGNOSIS — G4733 Obstructive sleep apnea (adult) (pediatric): Secondary | ICD-10-CM | POA: Diagnosis present

## 2017-07-23 DIAGNOSIS — E860 Dehydration: Secondary | ICD-10-CM | POA: Diagnosis not present

## 2017-07-23 DIAGNOSIS — Z6834 Body mass index (BMI) 34.0-34.9, adult: Secondary | ICD-10-CM | POA: Diagnosis not present

## 2017-07-23 DIAGNOSIS — R350 Frequency of micturition: Secondary | ICD-10-CM | POA: Diagnosis not present

## 2017-07-23 DIAGNOSIS — Z794 Long term (current) use of insulin: Secondary | ICD-10-CM

## 2017-07-23 DIAGNOSIS — Z833 Family history of diabetes mellitus: Secondary | ICD-10-CM

## 2017-07-23 DIAGNOSIS — Z79899 Other long term (current) drug therapy: Secondary | ICD-10-CM

## 2017-07-23 DIAGNOSIS — Z87891 Personal history of nicotine dependence: Secondary | ICD-10-CM

## 2017-07-23 DIAGNOSIS — E1129 Type 2 diabetes mellitus with other diabetic kidney complication: Secondary | ICD-10-CM | POA: Diagnosis not present

## 2017-07-23 DIAGNOSIS — R569 Unspecified convulsions: Secondary | ICD-10-CM | POA: Diagnosis not present

## 2017-07-23 DIAGNOSIS — R9082 White matter disease, unspecified: Secondary | ICD-10-CM | POA: Diagnosis present

## 2017-07-23 DIAGNOSIS — R9089 Other abnormal findings on diagnostic imaging of central nervous system: Secondary | ICD-10-CM | POA: Diagnosis present

## 2017-07-23 DIAGNOSIS — D649 Anemia, unspecified: Secondary | ICD-10-CM | POA: Diagnosis present

## 2017-07-23 DIAGNOSIS — R5381 Other malaise: Secondary | ICD-10-CM | POA: Diagnosis not present

## 2017-07-23 DIAGNOSIS — R031 Nonspecific low blood-pressure reading: Secondary | ICD-10-CM | POA: Diagnosis not present

## 2017-07-23 DIAGNOSIS — D72829 Elevated white blood cell count, unspecified: Secondary | ICD-10-CM | POA: Diagnosis present

## 2017-07-23 DIAGNOSIS — E876 Hypokalemia: Secondary | ICD-10-CM | POA: Diagnosis present

## 2017-07-23 DIAGNOSIS — I10 Essential (primary) hypertension: Secondary | ICD-10-CM | POA: Diagnosis present

## 2017-07-23 DIAGNOSIS — R42 Dizziness and giddiness: Secondary | ICD-10-CM | POA: Diagnosis not present

## 2017-07-23 DIAGNOSIS — E669 Obesity, unspecified: Secondary | ICD-10-CM | POA: Diagnosis present

## 2017-07-23 DIAGNOSIS — E785 Hyperlipidemia, unspecified: Secondary | ICD-10-CM | POA: Diagnosis present

## 2017-07-23 DIAGNOSIS — Z981 Arthrodesis status: Secondary | ICD-10-CM

## 2017-07-23 DIAGNOSIS — Z880 Allergy status to penicillin: Secondary | ICD-10-CM

## 2017-07-23 DIAGNOSIS — E1169 Type 2 diabetes mellitus with other specified complication: Secondary | ICD-10-CM

## 2017-07-23 DIAGNOSIS — Z86010 Personal history of colonic polyps: Secondary | ICD-10-CM

## 2017-07-23 DIAGNOSIS — R32 Unspecified urinary incontinence: Secondary | ICD-10-CM | POA: Diagnosis not present

## 2017-07-23 DIAGNOSIS — Z7982 Long term (current) use of aspirin: Secondary | ICD-10-CM

## 2017-07-23 DIAGNOSIS — B961 Klebsiella pneumoniae [K. pneumoniae] as the cause of diseases classified elsewhere: Secondary | ICD-10-CM | POA: Diagnosis present

## 2017-07-23 LAB — URINALYSIS, ROUTINE W REFLEX MICROSCOPIC
Bacteria, UA: NONE SEEN
Bilirubin Urine: NEGATIVE
Glucose, UA: >=500 mg/dL — AB
Ketones, ur: 5 mg/dL — AB
Nitrite: NEGATIVE
PH: 5 (ref 5.0–8.0)
Protein, ur: 100 mg/dL — AB
Specific Gravity, Urine: 1.028 (ref 1.005–1.030)

## 2017-07-23 LAB — COMPREHENSIVE METABOLIC PANEL
ALBUMIN: 3.3 g/dL — AB (ref 3.5–5.0)
ALT: 16 U/L — AB (ref 17–63)
AST: 17 U/L (ref 15–41)
Alkaline Phosphatase: 41 U/L (ref 38–126)
Anion gap: 14 (ref 5–15)
BILIRUBIN TOTAL: 1 mg/dL (ref 0.3–1.2)
BUN: 16 mg/dL (ref 6–20)
CO2: 29 mmol/L (ref 22–32)
CREATININE: 0.95 mg/dL (ref 0.61–1.24)
Calcium: 9.2 mg/dL (ref 8.9–10.3)
Chloride: 99 mmol/L — ABNORMAL LOW (ref 101–111)
GFR calc Af Amer: >60 mL/min (ref >60)
GLUCOSE: 113 mg/dL — AB (ref 65–99)
POTASSIUM: 3.3 mmol/L — AB (ref 3.5–5.1)
Sodium: 142 mmol/L (ref 135–145)
TOTAL PROTEIN: 6.8 g/dL (ref 6.5–8.1)

## 2017-07-23 LAB — INFLUENZA PANEL BY PCR (TYPE A & B)
Influenza A By PCR: NEGATIVE
Influenza B By PCR: NEGATIVE

## 2017-07-23 LAB — CBC WITH DIFFERENTIAL/PLATELET
BASOS ABS: 0 10*3/uL (ref 0.0–0.1)
Basophils Relative: 0 %
EOS ABS: 0 10*3/uL (ref 0.0–0.7)
EOS PCT: 0 %
HCT: 39.4 % (ref 39.0–52.0)
Hemoglobin: 12.9 g/dL — ABNORMAL LOW (ref 13.0–17.0)
LYMPHS ABS: 2.1 10*3/uL (ref 0.7–4.0)
LYMPHS PCT: 9 %
MCH: 29 pg (ref 26.0–34.0)
MCHC: 32.7 g/dL (ref 30.0–36.0)
MCV: 88.5 fL (ref 78.0–100.0)
Monocytes Absolute: 2.2 10*3/uL — ABNORMAL HIGH (ref 0.1–1.0)
Monocytes Relative: 9 %
Neutro Abs: 19.6 10*3/uL — ABNORMAL HIGH (ref 1.7–7.7)
Neutrophils Relative %: 82 %
PLATELETS: 267 10*3/uL (ref 150–400)
RBC: 4.45 MIL/uL (ref 4.22–5.81)
RDW: 13.1 % (ref 11.5–15.5)
WBC: 24 10*3/uL — ABNORMAL HIGH (ref 4.0–10.5)

## 2017-07-23 LAB — CBG MONITORING, ED
GLUCOSE-CAPILLARY: 256 mg/dL — AB (ref 65–99)
Glucose-Capillary: 102 mg/dL — ABNORMAL HIGH (ref 65–99)

## 2017-07-23 LAB — TROPONIN I

## 2017-07-23 LAB — I-STAT TROPONIN, ED: TROPONIN I, POC: 0.01 ng/mL (ref 0.00–0.08)

## 2017-07-23 LAB — MAGNESIUM: Magnesium: 1.8 mg/dL (ref 1.7–2.4)

## 2017-07-23 LAB — I-STAT CG4 LACTIC ACID, ED
LACTIC ACID, VENOUS: 1.34 mmol/L (ref 0.5–1.9)
LACTIC ACID, VENOUS: 1.81 mmol/L (ref 0.5–1.9)

## 2017-07-23 LAB — BRAIN NATRIURETIC PEPTIDE: B NATRIURETIC PEPTIDE 5: 86.5 pg/mL (ref 0.0–100.0)

## 2017-07-23 LAB — GLUCOSE, CAPILLARY: GLUCOSE-CAPILLARY: 85 mg/dL (ref 65–99)

## 2017-07-23 MED ORDER — ACETAMINOPHEN 650 MG RE SUPP: 650.0000 mg | Freq: Four times a day (QID) | RECTAL | Status: DC | PRN

## 2017-07-23 MED ORDER — CHLORTHALIDONE 25 MG PO TABS
25.0000 mg | ORAL_TABLET | Freq: Every day | ORAL | Status: DC
  Filled 2017-07-23: qty 1

## 2017-07-23 MED ORDER — INSULIN ASPART 100 UNIT/ML ~~LOC~~ SOLN: 0.0000 [IU] | Freq: Every day | SUBCUTANEOUS | Status: DC

## 2017-07-23 MED ORDER — POTASSIUM CHLORIDE CRYS ER 20 MEQ PO TBCR
40.0000 meq | EXTENDED_RELEASE_TABLET | Freq: Once | ORAL | Status: AC
  Administered 2017-07-23: 40 meq via ORAL
  Filled 2017-07-23: qty 2

## 2017-07-23 MED ORDER — POTASSIUM CHLORIDE CRYS ER 20 MEQ PO TBCR
20.0000 meq | EXTENDED_RELEASE_TABLET | Freq: Once | ORAL | Status: AC
  Administered 2017-07-23: 20 meq via ORAL
  Filled 2017-07-23: qty 1

## 2017-07-23 MED ORDER — ALENDRONATE SODIUM 70 MG PO TABS: 70.0000 mg | ORAL_TABLET | ORAL | Status: DC

## 2017-07-23 MED ORDER — SODIUM CHLORIDE 0.9 % IV SOLN
1.0000 g | INTRAVENOUS | Status: DC
Start: 2017-07-24 — End: 2017-07-25
  Administered 2017-07-24: 1 g via INTRAVENOUS
  Filled 2017-07-23 (×2): qty 10

## 2017-07-23 MED ORDER — SODIUM CHLORIDE 0.9 % IV BOLUS (SEPSIS): 1000.0000 mL | Freq: Once | INTRAVENOUS | Status: DC

## 2017-07-23 MED ORDER — BENAZEPRIL HCL 20 MG PO TABS
40.0000 mg | ORAL_TABLET | Freq: Every day | ORAL | Status: DC
  Administered 2017-07-24 – 2017-07-25 (×2): 40 mg via ORAL
  Filled 2017-07-23 (×2): qty 2

## 2017-07-23 MED ORDER — INSULIN ASPART 100 UNIT/ML ~~LOC~~ SOLN
0.0000 [IU] | Freq: Three times a day (TID) | SUBCUTANEOUS | Status: DC
  Administered 2017-07-24: 5 [IU] via SUBCUTANEOUS
  Administered 2017-07-24: 1 [IU] via SUBCUTANEOUS

## 2017-07-23 MED ORDER — ENOXAPARIN SODIUM 40 MG/0.4ML ~~LOC~~ SOLN
40.0000 mg | Freq: Every day | SUBCUTANEOUS | Status: DC
  Administered 2017-07-23 – 2017-07-24 (×2): 40 mg via SUBCUTANEOUS
  Filled 2017-07-23 (×2): qty 0.4

## 2017-07-23 MED ORDER — SODIUM CHLORIDE 0.9 % IV SOLN
1.0000 g | Freq: Once | INTRAVENOUS | Status: AC
  Administered 2017-07-23: 1 g via INTRAVENOUS
  Filled 2017-07-23: qty 10

## 2017-07-23 MED ORDER — ACETAMINOPHEN 325 MG PO TABS
650.0000 mg | ORAL_TABLET | Freq: Four times a day (QID) | ORAL | Status: DC | PRN
Start: 2017-07-23 — End: 2017-07-25
  Administered 2017-07-24: 650 mg via ORAL
  Filled 2017-07-23: qty 2

## 2017-07-23 MED ORDER — ENSURE ENLIVE PO LIQD
237.0000 mL | Freq: Two times a day (BID) | ORAL | Status: DC
  Administered 2017-07-24: 237 mL via ORAL

## 2017-07-23 MED ORDER — SODIUM CHLORIDE 0.9 % IV SOLN
INTRAVENOUS | Status: AC
  Administered 2017-07-23: 23:00:00 via INTRAVENOUS

## 2017-07-23 MED ORDER — SODIUM CHLORIDE 0.9 % IV BOLUS (SEPSIS)
1000.0000 mL | Freq: Once | INTRAVENOUS | Status: AC
  Administered 2017-07-23: 1000 mL via INTRAVENOUS

## 2017-07-23 MED ORDER — ASPIRIN EC 81 MG PO TBEC
81.0000 mg | DELAYED_RELEASE_TABLET | Freq: Every day | ORAL | Status: DC
  Administered 2017-07-24 – 2017-07-25 (×2): 81 mg via ORAL
  Filled 2017-07-23 (×2): qty 1

## 2017-07-23 MED ORDER — SODIUM CHLORIDE 0.9 % IV SOLN: 1.0000 g | INTRAVENOUS | Status: DC

## 2017-07-23 NOTE — Progress Notes (Signed)
Pt gave permission for his wife to remain in the room while the questions were asked on the nursing admission history. Lucius Conn BSN, RN-BC Admissions RN 07/23/2017 7:24 PM

## 2017-07-23 NOTE — H&P (Addendum)
TRH H&P   Patient Demographics:    Kenneth [name], is a 67 y.o. male  MRN: [id number]   DOB - 14-Mar-1951  Admit Date - 07/23/2017  Outpatient Primary MD for the patient is Crist Infante, MD  Referring MD/NP/PA:  Wyn Quaker  Outpatient Specialists:  Candee Furbish  Patient coming from:  Home=> office of Nichole Keltner  No chief complaint on file.  Syncope, UTI   HPI:    Kenneth [name]  is a 67 y.o. male, w Dm2, hypertension, hyperlipidemia, apparently c/o frequency of urination for the past 2 days. Pt apparently went to pcp office for evaluation and then apparently had syncope.  Pt was sent by EMS for evaluation.   In ED,  CXR IMPRESSION: Small right pleural effusion with mild atelectasis of right lung Base.  CT brain IMPRESSION: Mild diffuse cortical atrophy. Mild chronic ischemic white matter disease. No acute intracranial abnormality seen.  Na 142, K 3.3 Glucose 113 Bun 16, Creatinine 0.95 Ast 17, Alt 16  Wbc 24. 0, Hgb 12.9 Plt 267  Glucose 256 BNP 256   Urine wbc tntc rbc 0-5  Pt will be admitted for syncope and UTI.     Review of systems:    In addition to the HPI above,    No Fever-chills,   No Headache, No changes with Vision or hearing, No problems swallowing food or Liquids, No Chest pain, Cough or Shortness of Breath, No Abdominal pain, No Nausea or Vommitting, Bowel movements are regular, No Blood in stool or Urine,   No new skin rashes or bruises, No new joints pains-aches,  No new weakness, tingling, numbness in any extremity, No recent weight gain or loss, No polyuria, polydypsia or polyphagia, No significant Mental Stressors.  A full 10 point Review of Systems was done, except as stated above, all other Review of Systems were negative.   With Past History of the following :    Past Medical History:  Diagnosis Date  .  Broken neck ([place])   . Diabetes mellitus   . 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 History of colon polyps - adenomas 04/03/2010  . Hyperlipidemia   . Hypertension   . OSA (obstructive sleep apnea)    refusing CPAP      Past Surgical History:  Procedure Laterality Date  . ANTERIOR CERVICAL DECOMP/DISCECTOMY FUSION N/A 10/01/2012   Procedure: ANTERIOR CERVICAL DECOMPRESSION/DISCECTOMY FUSION 2 LEVELS;  Surgeon: Eustace Moore, MD;  Location: [facility];  Service: Neurosurgery;  Laterality: N/A;  Cervical five-six,Cervical six-seven  . COLONOSCOPY  09/30/2010   diverticulosis, small internal hemorrhoids  . COLONOSCOPY W/ POLYPECTOMY  04/03/2010   4 polyps 8-72mm, worst TV adenoma with high-grade dysplasia  . NO PAST SURGERIES    . POSTERIOR CERVICAL FUSION/FORAMINOTOMY Left 10/20/2012   Procedure: C/5-6,C/6-7 [place]/Multi level,POSTERIOR CERVICAL FUSION/FORAMINOTOMY C/4-7;  Surgeon: Shanon Brow  Adah Salvage, MD;  Location: [facility];  Service: Neurosurgery;  Laterality: Left;  Cervical Five-Six, Cervical Six-Seven Laminectomies, Posterior Cervical Fusion/Foraminotomies Cervical Four through Seven.   . TONSILLECTOMY        Social History:     Social History   Tobacco Use  . Smoking status: Former Smoker    Years: 2.00    Last attempt to quit: 09/22/1969    Years since quitting: 47.8  . Smokeless tobacco: Never Used  Substance Use Topics  . Alcohol use: No     Lives - at home  Mobility - walks by self   Family History :     Family History  Problem Relation Age of Onset  . Diabetes Mother   . Stomach cancer Neg Hx   . Rectal cancer Neg Hx   . Pancreatic cancer Neg Hx   . Colon cancer Neg Hx       Home Medications:   Prior to Admission medications   Medication Sig Start Date End Date Taking? Authorizing Provider  acetaminophen (TYLENOL) 500 MG tablet Take 500 mg by mouth daily as needed for headache.   Yes [provider]  alendronate (FOSAMAX) 70 MG tablet  Take 1 tablet by mouth once a week. 09/25/15  Yes [provider]  aspirin 81 MG tablet Take 81 mg by mouth daily.     Yes [provider]  benazepril (LOTENSIN) 40 MG tablet Take 1 tablet (40 mg total) by mouth daily. 02/10/17  Yes Daune Perch, NP  calcium carbonate (OS-CAL) 600 MG TABS Take 600 mg by mouth 2 (two) times daily with a meal.     Yes [provider]  chlorthalidone (HYGROTON) 25 MG tablet Take 1 tablet (25 mg total) by mouth daily. 04/23/17 07/23/17 Yes Jerline Pain, MD  diphenhydramine-acetaminophen (TYLENOL PM) 25-500 MG TABS tablet Take 1 tablet by mouth at bedtime as needed (SLEEP).   Yes [provider]  Insulin Glargine (TOUJEO MAX SOLOSTAR) 300 UNIT/ML SOPN Inject 60 Units into the skin every morning.    Yes [provider]  JARDIANCE 25 MG TABS tablet Take 25 mg by mouth daily. 06/24/17  Yes [provider]  metFORMIN (GLUCOPHAGE) 1000 MG tablet Take 1,000 mg by mouth 2 (two) times daily with a meal.     Yes [provider]  NOVOLOG FLEXPEN 100 UNIT/ML FlexPen Inject 16 Units as directed 3 (three) times daily. 10/12/15  Yes [provider]  B-D UF III MINI PEN NEEDLES 31G X 5 MM MISC Inject 1 Stick as directed as directed. 11/08/15   [provider]     Allergies:     Allergies  Allergen Reactions  . Penicillins     CHILDHOOD ALLERGY Has patient had a PCN reaction causing immediate rash, facial/tongue/throat swelling, SOB or lightheadedness with hypotension: Unknown Has patient had a PCN reaction causing severe rash involving mucus membranes or skin necrosis: Unknown Has patient had a PCN reaction that required hospitalization: Unknown Has patient had a PCN reaction occurring within the last 10 years: Unknown If all of the above answers are "NO", then may proceed with Cephalosporin use.       Physical Exam:   Vitals  Blood pressure 126/60, pulse (!) 57, temperature 99.8 F (37.7  C), temperature source Oral, resp. rate (!) 8, SpO2 96 %.   1. General  lying in bed in NAD,   2. Normal affect and insight, Not Suicidal or Homicidal, Awake Alert, Oriented X  3.  3. No F.N deficits, ALL C.Nerves Intact, Strength 5/5 all 4 extremities, Sensation intact all 4 extremities, Plantars down going.  4. Ears and Eyes appear Normal, Conjunctivae clear, PERRLA. Moist Oral Mucosa.  5. Supple Neck, No JVD, No cervical lymphadenopathy appriciated, No Carotid Bruits.  6. Symmetrical Chest wall movement, Good air movement bilaterally, CTAB.  7. RRR, No Gallops, Rubs or Murmurs, No Parasternal Heave.  8. Positive Bowel Sounds, Abdomen Soft, No tenderness, No organomegaly appriciated,No rebound -guarding or rigidity.  9.  No Cyanosis, Normal Skin Turgor, No Skin Rash or Bruise.  10. Good muscle tone,  joints appear normal , no effusions, Normal ROM.  11. No Palpable Lymph Nodes in Neck or Axillae     Data Review:    CBC Recent Labs  Lab 07/23/17 1445  WBC 24.0*  HGB 12.9*  HCT 39.4  PLT 267  MCV 88.5  MCH 29.0  MCHC 32.7  RDW 13.1  LYMPHSABS 2.1  MONOABS 2.2*  EOSABS 0.0  BASOSABS 0.0   ------------------------------------------------------------------------------------------------------------------  Chemistries  Recent Labs  Lab 07/23/17 1445  NA 142  K 3.3*  CL 99*  CO2 29  GLUCOSE 113*  BUN 16  CREATININE 0.95  CALCIUM 9.2  MG 1.8  AST 17  ALT 16*  ALKPHOS 41  BILITOT 1.0   ------------------------------------------------------------------------------------------------------------------ CrCl cannot be calculated (Unknown ideal weight.). ------------------------------------------------------------------------------------------------------------------ No results for input(s): TSH, T4TOTAL, T3FREE, THYROIDAB in the last 72 hours.  Invalid input(s): FREET3  Coagulation profile No results for input(s): INR, PROTIME in the last 168  hours. ------------------------------------------------------------------------------------------------------------------- No results for input(s): DDIMER in the last 72 hours. -------------------------------------------------------------------------------------------------------------------  Cardiac Enzymes No results for input(s): CKMB, TROPONINI, MYOGLOBIN in the last 168 hours.  Invalid input(s): CK ------------------------------------------------------------------------------------------------------------------    Component Value Date/Time   BNP 86.5 07/23/2017 1445     ---------------------------------------------------------------------------------------------------------------  Urinalysis    Component Value Date/Time   COLORURINE YELLOW 07/23/2017 1514   APPEARANCEUR CLEAR 07/23/2017 1514   LABSPEC 1.028 07/23/2017 1514   PHURINE 5.0 07/23/2017 1514   GLUCOSEU >=500 (A) 07/23/2017 1514   HGBUR SMALL (A) 07/23/2017 1514   BILIRUBINUR NEGATIVE 07/23/2017 1514   KETONESUR 5 (A) 07/23/2017 1514   PROTEINUR 100 (A) 07/23/2017 1514   NITRITE NEGATIVE 07/23/2017 1514   LEUKOCYTESUR TRACE (A) 07/23/2017 1514    ----------------------------------------------------------------------------------------------------------------   Imaging Results:    Ct Head Wo Contrast  Result Date: 07/23/2017 CLINICAL DATA:  Seizure. EXAM: CT HEAD WITHOUT CONTRAST TECHNIQUE: Contiguous axial images were obtained from the base of the skull through the vertex without intravenous contrast. COMPARISON:  None. FINDINGS: Brain: Mild diffuse cortical atrophy is noted. Mild chronic ischemic white matter disease is noted. No mass effect or midline shift is noted. Ventricular size is within normal limits. There is no evidence of mass lesion, hemorrhage or acute infarction. Vascular: No hyperdense vessel or unexpected calcification. Skull: Normal. Negative for fracture or focal lesion. Sinuses/Orbits: No  acute finding. Other: None. IMPRESSION: Mild diffuse cortical atrophy. Mild chronic ischemic white matter disease. No acute intracranial abnormality seen. Electronically Signed   By: Marijo Conception, M.D.   On: 07/23/2017 15:50   Dg Chest Port 1 View  Result Date: 07/23/2017 CLINICAL DATA:  Syncope and dehydration EXAM: PORTABLE CHEST 1 VIEW COMPARISON:  Sep 30, 2012 FINDINGS: The heart size and mediastinal contours are stable. The heart size is enlarged. There is a small right pleural effusion. Mild atelectasis of right lung base is noted. There is no focal  pneumonia or pulmonary edema. The visualized skeletal structures are stable. IMPRESSION: Small right pleural effusion with mild atelectasis of right lung base. Electronically Signed   By: Abelardo Diesel M.D.   On: 07/23/2017 14:24       Assessment & Plan:    Principal Problem:   Syncope Active Problems:   Long Q-T syndrome   Leukocytosis   Hypokalemia   Syncope Tele Trop I q6h x3 Carotid ulttrasound Cardiac echo  UTI Blood culture x2 Awaiting urine culture Start Rocephin 1gm iv qday  Leukocytosis Check cbc in am  Hypokalemia replete Check cmp  DVT Prophylaxis  Lovenox - SCDs   AM Labs Ordered, also please review Full Orders  Family Communication: Admission, patients condition and plan of care including tests being ordered have been discussed with the patient who indicate understanding and agree with the plan and Code Status.  Code Status  FULL CODE  Likely DC to  home  Condition GUARDED    Consults called:  none  Admission status:   inpatient  Time spent in minutes :  45    Jani Gravel M.D on 07/23/2017 at 7:27 PM  Between 7am to 7pm - Pager - [phone] . After 7pm go to [email or web address] - password [facility]  [facility] - Office  [phone]

## 2017-07-23 NOTE — ED Triage Notes (Signed)
Pt arrive via EMS from Dr. Gabriel Carina, for txmt for polyuria, fever and weakness and n/v/d.  While pt wasn't MD office pt had a syncopal episode  And then began to SZ like activity lasting about 15 seconds. Pt BP had a drop lying BP 112/56. Sitting upright  SBP 90.    IV 20 G L forearm 500 cc given enroute.  EMS v/s HR 68, CBG 68 o2 sat 91% RA and with 3 lpm 99%

## 2017-07-23 NOTE — ED Notes (Signed)
Communicate with Pt spouse Kenneth [name] regarding pt status. Made her aware that pt had just arrived and we were in the process of getting some test on him and writer would call back shortly with updates.

## 2017-07-23 NOTE — ED Notes (Signed)
Pt is alert and oriented x 4 and is verbally responsive. Pt denies pain at this time. Pt made aware of plan at this time.  Pt denies pain or burning with urination. Pt reports that he has been having urinary urgency and has had some loss of bladder control.

## 2017-07-23 NOTE — ED Provider Notes (Signed)
[facility] DEPT Provider Note   CSN: [id number] Arrival date & time: 07/23/17  1335     History   Chief Complaint No chief complaint on file.   HPI Kenneth [name] is a 67 y.o. male with a history of DM 2, hypertension, hyperlipidemia, who presents today for evaluation after he had a syncopal event.  He was reportedly at his doctor's office today seeking treatment for polyuria, fever, weakness, not feeling well, urinary incontinence, when he reportedly had a syncopal event.  Was given 500 cc fluid in route by EMS.  He denies nausea, vomiting, or diarrhea.  He denies fevers.  He has lost 15 lbs in one month.    He reports that he has not checked his blood sugar in over a month.   In the past the past 3-4 days he has been feeling very fatigued, he has been drinking water, he has had excessive amounts of urine, is consistently urinary incontinent and soaking urinary depends pads frequently.    HPI  Past Medical History:  Diagnosis Date  . Broken neck ([place])   . Diabetes mellitus   . 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 History of colon polyps - adenomas 04/03/2010  . Hyperlipidemia   . Hypertension   . OSA (obstructive sleep apnea)    refusing CPAP    Patient Active Problem List   Diagnosis Date Noted  . 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 Essential (primary) hypertension 02/10/2017  . Hyperlipidemia 02/10/2017  . Hearing loss 02/10/2017  . OSA (obstructive sleep apnea) 02/10/2017  . History of colon polyps - adenomas 04/03/2010    Past Surgical History:  Procedure Laterality Date  . ANTERIOR CERVICAL DECOMP/DISCECTOMY FUSION N/A 10/01/2012   Procedure: ANTERIOR CERVICAL DECOMPRESSION/DISCECTOMY FUSION 2 LEVELS;  Surgeon: Eustace Moore, MD;  Location: [facility];  Service: Neurosurgery;  Laterality: N/A;  Cervical five-six,Cervical six-seven  . COLONOSCOPY  09/30/2010   diverticulosis, small internal hemorrhoids  . COLONOSCOPY W/  POLYPECTOMY  04/03/2010   4 polyps 8-38mm, worst TV adenoma with high-grade dysplasia  . NO PAST SURGERIES    . POSTERIOR CERVICAL FUSION/FORAMINOTOMY Left 10/20/2012   Procedure: C/5-6,C/6-7 [place]/Multi level,POSTERIOR CERVICAL FUSION/FORAMINOTOMY C/4-7;  Surgeon: Eustace Moore, MD;  Location: [facility];  Service: Neurosurgery;  Laterality: Left;  Cervical Five-Six, Cervical Six-Seven Laminectomies, Posterior Cervical Fusion/Foraminotomies Cervical Four through Seven.   . TONSILLECTOMY         Home Medications    Prior to Admission medications   Medication Sig Start Date End Date Taking? Authorizing Provider  acetaminophen (TYLENOL) 500 MG tablet Take 500 mg by mouth daily as needed for headache.   Yes [provider]  alendronate (FOSAMAX) 70 MG tablet Take 1 tablet by mouth once a week. 09/25/15  Yes [provider]  aspirin 81 MG tablet Take 81 mg by mouth daily.     Yes [provider]  benazepril (LOTENSIN) 40 MG tablet Take 1 tablet (40 mg total) by mouth daily. 02/10/17  Yes Daune Perch, NP  calcium carbonate (OS-CAL) 600 MG TABS Take 600 mg by mouth 2 (two) times daily with a meal.     Yes [provider]  chlorthalidone (HYGROTON) 25 MG tablet Take 1 tablet (25 mg total) by mouth daily. 04/23/17 07/23/17 Yes Jerline Pain, MD  diphenhydramine-acetaminophen (TYLENOL PM) 25-500 MG TABS tablet Take 1 tablet by mouth at bedtime as needed (SLEEP).   Yes [provider]  Insulin Glargine (TOUJEO MAX SOLOSTAR) 300 UNIT/ML SOPN Inject 60 Units into the skin every morning.    Yes [provider]  JARDIANCE 25 MG TABS tablet Take 25 mg by mouth daily. 06/24/17  Yes [provider]  metFORMIN (GLUCOPHAGE) 1000 MG tablet Take 1,000 mg by mouth 2 (two) times daily with a meal.     Yes [provider]  NOVOLOG FLEXPEN 100 UNIT/ML FlexPen Inject 16 Units as directed 3 (three) times daily. 10/12/15  Yes [provider]  B-D UF III MINI PEN NEEDLES 31G X 5 MM MISC Inject 1 Stick as directed as directed. 11/08/15   [provider]    Family History Family History  Problem Relation Age of Onset  . Diabetes Mother   . Stomach cancer Neg Hx   . Rectal cancer Neg Hx   . Pancreatic cancer Neg Hx   . Colon cancer Neg Hx     Social History Social History   Tobacco Use  . Smoking status: Former Smoker    Years: 2.00    Last attempt to quit: 09/22/1969    Years since quitting: 47.8  . Smokeless tobacco: Never Used  Substance Use Topics  . Alcohol use: No  . Drug use: No     Allergies   Penicillins   Review of Systems Review of Systems  Constitutional: Negative for activity change, chills and fever.  HENT: Negative for sore throat.   Eyes: Negative for visual disturbance.  Respiratory: Negative for chest tightness and shortness of breath.   Cardiovascular: Negative for chest pain.  Gastrointestinal: Negative for abdominal pain, nausea and vomiting.  Endocrine: Positive for polydipsia and polyuria.  Genitourinary: Positive for frequency and urgency. Negative for dysuria, flank pain and hematuria.  Musculoskeletal: Negative for neck pain and neck stiffness.  Skin: Negative for color change, rash and wound.  Neurological: Positive for syncope and light-headedness. Negative for dizziness, seizures, weakness, numbness and headaches.  Psychiatric/Behavioral: Negative for confusion.     Physical Exam Updated Vital Signs BP 126/60   Pulse (!) 57   Temp 99.8 F (37.7 C) (Oral)   Resp (!) 8   SpO2 96%   Physical Exam  Constitutional: He is oriented to person, place, and time. He appears well-developed and well-nourished.  Acutely ill  HENT:  Head: Normocephalic and atraumatic.  Eyes: Conjunctivae are normal. Right eye exhibits no discharge. Left eye exhibits no discharge. No scleral icterus.  Neck: Normal range of motion. Neck supple.  Cardiovascular: Normal rate, regular  rhythm, normal heart sounds and intact distal pulses. Exam reveals no friction rub.  No murmur heard. Pulmonary/Chest: Effort normal and breath sounds normal. No stridor. No respiratory distress.  Abdominal: Soft. Bowel sounds are normal. He exhibits no distension. There is no tenderness. There is no guarding.  Musculoskeletal: He exhibits no edema or deformity.  Neurological: He is alert and oriented to person, place, and time. He exhibits normal muscle tone.  Skin: Skin is warm. He is diaphoretic. There is pallor.  Psychiatric: He has a normal mood and affect. His behavior is normal.  Nursing note and vitals reviewed.    ED Treatments / Results  Labs (all labs ordered are listed, but only abnormal results are displayed) Labs Reviewed  COMPREHENSIVE METABOLIC PANEL - Abnormal; Notable for the following components:      Result Value   Potassium 3.3 (*)    Chloride 99 (*)    Glucose, Bld 113 (*)  Albumin 3.3 (*)    ALT 16 (*)    All other components within normal limits  CBC WITH DIFFERENTIAL/PLATELET - Abnormal; Notable for the following components:   WBC 24.0 (*)    Hemoglobin 12.9 (*)    Neutro Abs 19.6 (*)    Monocytes Absolute 2.2 (*)    All other components within normal limits  URINALYSIS, ROUTINE W REFLEX MICROSCOPIC - Abnormal; Notable for the following components:   Glucose, UA >=500 (*)    Hgb urine dipstick SMALL (*)    Ketones, ur 5 (*)    Protein, ur 100 (*)    Leukocytes, UA TRACE (*)    Squamous Epithelial / LPF 0-5 (*)    All other components within normal limits  CBG MONITORING, ED - Abnormal; Notable for the following components:   Glucose-Capillary 102 (*)    All other components within normal limits  URINE CULTURE  RESPIRATORY PANEL BY PCR  BRAIN NATRIURETIC PEPTIDE  MAGNESIUM  INFLUENZA PANEL BY PCR (TYPE A & B)  I-STAT TROPONIN, ED  I-STAT CG4 LACTIC ACID, ED  I-STAT CG4 LACTIC ACID, ED   Bladder scan zero ML.   EKG  EKG  Interpretation  Date/Time:  Thursday July 23 2017 13:54:59 EDT Ventricular Rate:  67 PR Interval:    QRS Duration: 105 QT Interval:  509 QTC Calculation: 538 R Axis:   67 Text Interpretation:  Sinus rhythm Prolonged QT interval similar to prior 5/14 Confirmed by Aletta Edouard [phone]) on 07/23/2017 2:54:33 PM       Radiology Ct Head Wo Contrast  Result Date: 07/23/2017 CLINICAL DATA:  Seizure. EXAM: CT HEAD WITHOUT CONTRAST TECHNIQUE: Contiguous axial images were obtained from the base of the skull through the vertex without intravenous contrast. COMPARISON:  None. FINDINGS: Brain: Mild diffuse cortical atrophy is noted. Mild chronic ischemic white matter disease is noted. No mass effect or midline shift is noted. Ventricular size is within normal limits. There is no evidence of mass lesion, hemorrhage or acute infarction. Vascular: No hyperdense vessel or unexpected calcification. Skull: Normal. Negative for fracture or focal lesion. Sinuses/Orbits: No acute finding. Other: None. IMPRESSION: Mild diffuse cortical atrophy. Mild chronic ischemic white matter disease. No acute intracranial abnormality seen. Electronically Signed   By: Marijo Conception, M.D.   On: 07/23/2017 15:50   Dg Chest Port 1 View  Result Date: 07/23/2017 CLINICAL DATA:  Syncope and dehydration EXAM: PORTABLE CHEST 1 VIEW COMPARISON:  Sep 30, 2012 FINDINGS: The heart size and mediastinal contours are stable. The heart size is enlarged. There is a small right pleural effusion. Mild atelectasis of right lung base is noted. There is no focal pneumonia or pulmonary edema. The visualized skeletal structures are stable. IMPRESSION: Small right pleural effusion with mild atelectasis of right lung base. Electronically Signed   By: Abelardo Diesel M.D.   On: 07/23/2017 14:24    Procedures Procedures (including critical care time)  Medications Ordered in ED Medications  sodium chloride 0.9 % bolus 1,000 mL (not administered)   potassium chloride SA (K-DUR,KLOR-CON) CR tablet 40 mEq (40 mEq Oral Given 07/23/17 1613)  sodium chloride 0.9 % bolus 1,000 mL (0 mLs Intravenous Stopped 07/23/17 1704)  cefTRIAXone (ROCEPHIN) 1 g in sodium chloride 0.9 % 100 mL IVPB (1 g Intravenous New Bag/Given 07/23/17 1613)     Initial Impression / Assessment and Plan / ED Course  I have reviewed the triage vital signs and the nursing notes.  Pertinent labs & imaging results  that were available during my care of the patient were reviewed by me and considered in my medical decision making (see chart for details).  Clinical Course as of Jul 23 1852  Thu Jul 23, 2017  1422 Went to get paperwork, oxygen was 92% on room air.  Placed back on oxygen.   [EH]  1423 Spoke with Reginold Agent NP Reports it sounds like he had a syncopal event "looks like when someone vagals."  He did not have any tonic-clonic type movements, he did not have any extremity jerking or myoclonic jerks.  She clarifies that he did not have any seizures, just was nodding his head up and down a few times.    [EH]  7633 67 year old male was sent in from the PCPs office after he presented there with complaining of urinary frequency and not feeling well for a week or 2.  States he has not been checking his sugars but did today and they were only in the low 200s.  While there in the office he had a syncopal event with some clonic motor activity.  Here he is awake alert and states he feels reasonably well.  He feels that the new medication they started him on last month.  He is got a soft blood pressure here although is stood up and has not been significantly symptomatic.  Skin otherwise benign physical exam.  Checking some labs and giving him some fluids.  [MB]  8338 Spoke with Dr. Maudie Mercury who will admit patient.   [EH]    Clinical Course User Index [EH] Lorin Glass, PA-C [MB] Hayden Rasmussen, MD   Patient presents today for evaluation after a syncopal event at PCPs  office.  He was originally they are for excessive urination and urinary incontinence.  He was found to be hyperglycemic at his PCPs office.  Labs were obtained, white count 24 with neutrophils 19.6.  EKG reviewed, no acute changes, normal troponin.  EKG does have prolonged QT which is present on previous EKGs.  Urine with over 500 glucose, small blood, 100 protein, negative nitrite trace leukocytes with no bacteria seen.  Given history however I am suspicious for urinary tract infection.  Urine culture sent.  Influenza and respiratory viral panel were both ordered.  He was given a 2 liter fluid bolus in the ER, which decreased his sugar to 113.  Based on his syncopal event, possible UTI I feel the patient would benefit from admission for monitoring and IV antibiotics.  I spoke with the hospitalist who agreed to admit patient.  This patient was seen as a shared visit with Dr. Melina Copa who evaluated the patient and agreed with my plan.  Final Clinical Impressions(s) / ED Diagnoses   Final diagnoses:  Long Q-T syndrome    ED Discharge Orders    None       Ollen Gross 07/24/17 2305    Hayden Rasmussen, MD 07/25/17 1950

## 2017-07-23 NOTE — ED Notes (Signed)
ED TO INPATIENT HANDOFF REPORT  Name/Age/Gender Kenneth [name] 67 y.o. male  Code Status Code Status History    This patient does not have a recorded code status. Please follow your organizational policy for patients in this situation.      Home/SNF/Other Home  Chief Complaint syncope   Level of Care/Admitting Diagnosis ED Disposition    ED Disposition Condition Comment   Admit  Hospital Area: [facility] [[id number]]  Level of Care: Telemetry [5]  Admit to tele based on following criteria: Monitor QTC interval  Diagnosis: Syncope [206001]  Admitting Physician: Jani Gravel [[id number]]  Attending Physician: Jani Gravel [[id number]]  PT Class (Do Not Modify): Observation [104]  PT Acc Code (Do Not Modify): Observation [10022]       Medical History Past Medical History:  Diagnosis Date  . Broken neck ([place])   . Diabetes mellitus   . 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 History of colon polyps - adenomas 04/03/2010  . Hyperlipidemia   . Hypertension   . OSA (obstructive sleep apnea)    refusing CPAP    Allergies Allergies  Allergen Reactions  . Penicillins     CHILDHOOD ALLERGY Has patient had a PCN reaction causing immediate rash, facial/tongue/throat swelling, SOB or lightheadedness with hypotension: Unknown Has patient had a PCN reaction causing severe rash involving mucus membranes or skin necrosis: Unknown Has patient had a PCN reaction that required hospitalization: Unknown Has patient had a PCN reaction occurring within the last 10 years: Unknown If all of the above answers are "NO", then may proceed with Cephalosporin use.      IV Location/Drains/Wounds Patient Lines/Drains/Airways Status   Active Line/Drains/Airways    Name:   Placement date:   Placement time:   Site:   Days:   Incision 10/01/12 Neck Bilateral   10/01/12    1632     1756   Incision 10/20/12 Neck   10/20/12    1443     1737          Labs/Imaging Results for  orders placed or performed during the hospital encounter of 07/23/17 (from the past 48 hour(s))  CBG monitoring, ED     Status: Abnormal   Collection Time: 07/23/17  2:14 PM  Result Value Ref Range   Glucose-Capillary 102 (H) 65 - 99 mg/dL  Comprehensive metabolic panel     Status: Abnormal   Collection Time: 07/23/17  2:45 PM  Result Value Ref Range   Sodium 142 135 - 145 mmol/L   Potassium 3.3 (L) 3.5 - 5.1 mmol/L   Chloride 99 (L) 101 - 111 mmol/L   CO2 29 22 - 32 mmol/L   Glucose, Bld 113 (H) 65 - 99 mg/dL   BUN 16 6 - 20 mg/dL   Creatinine, Ser 0.95 0.61 - 1.24 mg/dL   Calcium 9.2 8.9 - 10.3 mg/dL   Total Protein 6.8 6.5 - 8.1 g/dL   Albumin 3.3 (L) 3.5 - 5.0 g/dL   AST 17 15 - 41 U/L   ALT 16 (L) 17 - 63 U/L   Alkaline Phosphatase 41 38 - 126 U/L   Total Bilirubin 1.0 0.3 - 1.2 mg/dL   GFR calc non Af Amer >60 >60 mL/min   GFR calc Af Amer >60 >60 mL/min    Comment: (NOTE) The eGFR has been calculated using the CKD EPI equation. This calculation has not been validated in all clinical situations. eGFR's persistently <60 mL/min  signify possible Chronic Kidney Disease.    Anion gap 14 5 - 15    Comment: Performed at [facility], [place] [address], [place], [place] [address]  CBC with Differential     Status: Abnormal   Collection Time: 07/23/17  2:45 PM  Result Value Ref Range   WBC 24.0 (H) 4.0 - 10.5 K/uL   RBC 4.45 4.22 - 5.81 MIL/uL   Hemoglobin 12.9 (L) 13.0 - 17.0 g/dL   HCT 39.4 39.0 - 52.0 %   MCV 88.5 78.0 - 100.0 fL   MCH 29.0 26.0 - 34.0 pg   MCHC 32.7 30.0 - 36.0 g/dL   RDW 13.1 11.5 - 15.5 %   Platelets 267 150 - 400 K/uL   Neutrophils Relative % 82 %   Neutro Abs 19.6 (H) 1.7 - 7.7 K/uL   Lymphocytes Relative 9 %   Lymphs Abs 2.1 0.7 - 4.0 K/uL   Monocytes Relative 9 %   Monocytes Absolute 2.2 (H) 0.1 - 1.0 K/uL   Eosinophils Relative 0 %   Eosinophils Absolute 0.0 0.0 - 0.7 K/uL   Basophils Relative 0 %   Basophils Absolute 0.0  0.0 - 0.1 K/uL    Comment: Performed at [facility], [place] [address], [place], [place] [address]  Brain natriuretic peptide     Status: None   Collection Time: 07/23/17  2:45 PM  Result Value Ref Range   B Natriuretic Peptide 86.5 0.0 - 100.0 pg/mL    Comment: Performed at [facility], [place] [address], [place], [place] [address]  Magnesium     Status: None   Collection Time: 07/23/17  2:45 PM  Result Value Ref Range   Magnesium 1.8 1.7 - 2.4 mg/dL    Comment: Performed at [facility], [place] [address] [place], [place] [address]  Urinalysis, Routine w reflex microscopic     Status: Abnormal   Collection Time: 07/23/17  3:14 PM  Result Value Ref Range   Color, Urine YELLOW YELLOW   APPearance CLEAR CLEAR   Specific Gravity, Urine 1.028 1.005 - 1.030   pH 5.0 5.0 - 8.0   Glucose, UA >=500 (A) NEGATIVE mg/dL   Hgb urine dipstick SMALL (A) NEGATIVE   Bilirubin Urine NEGATIVE NEGATIVE   Ketones, ur 5 (A) NEGATIVE mg/dL   Protein, ur 100 (A) NEGATIVE mg/dL   Nitrite NEGATIVE NEGATIVE   Leukocytes, UA TRACE (A) NEGATIVE   RBC / HPF 0-5 0 - 5 RBC/hpf   WBC, UA TOO NUMEROUS TO COUNT 0 - 5 WBC/hpf   Bacteria, UA NONE SEEN NONE SEEN   Squamous Epithelial / LPF 0-5 (A) NONE SEEN   Mucus PRESENT     Comment: Performed at [facility], [place] [address], [place], [place] [address]  I-stat troponin, ED     Status: None   Collection Time: 07/23/17  3:22 PM  Result Value Ref Range   Troponin i, poc 0.01 0.00 - 0.08 ng/mL   Comment 3            Comment: Due to the release kinetics of cTnI, a negative result within the first hours of the onset of symptoms does not rule out myocardial infarction with certainty. If myocardial infarction is still suspected, repeat the test at appropriate intervals.   I-Stat CG4 Lactic Acid, ED     Status: None   Collection Time: 07/23/17  3:25 PM  Result Value Ref Range  Lactic Acid,  Venous 1.34 0.5 - 1.9 mmol/L  I-Stat CG4 Lactic Acid, ED     Status: None   Collection Time: 07/23/17  3:51 PM  Result Value Ref Range   Lactic Acid, Venous 1.81 0.5 - 1.9 mmol/L  Influenza panel by PCR (type A & B)     Status: None   Collection Time: 07/23/17  5:21 PM  Result Value Ref Range   Influenza A By PCR NEGATIVE NEGATIVE   Influenza B By PCR NEGATIVE NEGATIVE    Comment: (NOTE) The Xpert Xpress Flu assay is intended as an aid in the diagnosis of  influenza and should not be used as a sole basis for treatment.  This  assay is FDA approved for nasopharyngeal swab specimens only. Nasal  washings and aspirates are unacceptable for Xpert Xpress Flu testing. Performed at [facility], [place] [address], [place], [place] [address]   CBG monitoring, ED     Status: Abnormal   Collection Time: 07/23/17  7:05 PM  Result Value Ref Range   Glucose-Capillary 256 (H) 65 - 99 mg/dL   Ct Head Wo Contrast  Result Date: 07/23/2017 CLINICAL DATA:  Seizure. EXAM: CT HEAD WITHOUT CONTRAST TECHNIQUE: Contiguous axial images were obtained from the base of the skull through the vertex without intravenous contrast. COMPARISON:  None. FINDINGS: Brain: Mild diffuse cortical atrophy is noted. Mild chronic ischemic white matter disease is noted. No mass effect or midline shift is noted. Ventricular size is within normal limits. There is no evidence of mass lesion, hemorrhage or acute infarction. Vascular: No hyperdense vessel or unexpected calcification. Skull: Normal. Negative for fracture or focal lesion. Sinuses/Orbits: No acute finding. Other: None. IMPRESSION: Mild diffuse cortical atrophy. Mild chronic ischemic white matter disease. No acute intracranial abnormality seen. Electronically Signed   By: Marijo Conception, M.D.   On: 07/23/2017 15:50   Dg Chest Port 1 View  Result Date: 07/23/2017 CLINICAL DATA:  Syncope and dehydration EXAM: PORTABLE CHEST 1 VIEW COMPARISON:  Sep 30, 2012  FINDINGS: The heart size and mediastinal contours are stable. The heart size is enlarged. There is a small right pleural effusion. Mild atelectasis of right lung base is noted. There is no focal pneumonia or pulmonary edema. The visualized skeletal structures are stable. IMPRESSION: Small right pleural effusion with mild atelectasis of right lung base. Electronically Signed   By: Abelardo Diesel M.D.   On: 07/23/2017 14:24    Pending Labs Unresulted Labs (From admission, onward)   Start     Ordered   07/23/17 1445  Respiratory Panel by PCR  (Respiratory virus panel)  Once,   R     07/23/17 1444   07/23/17 1359  Urine culture  STAT,   STAT     07/23/17 1359   Signed and Held  Creatinine, serum  (enoxaparin (LOVENOX)    CrCl >/= 30 ml/min)  Weekly,   R    Comments:  while on enoxaparin therapy    Signed and Held   Signed and Held  Comprehensive metabolic panel  Tomorrow morning,   R     Signed and Held   Signed and Held  CBC  Tomorrow morning,   R     Signed and Held   Signed and Held  Troponin I  Now then every 6 hours,   R     Signed and Held      Vitals/Pain Today's Vitals   07/23/17 1900 07/23/17 1915 07/23/17 1930  07/23/17 1952  BP: 127/62  125/61   Pulse: (!) 58 (!) 57 (!) 58   Resp: 14 (!) 22 16   Temp:      TempSrc:      SpO2: 100% 100% 100%   PainSc:    0-No pain    Isolation Precautions Droplet precaution  Medications Medications  sodium chloride 0.9 % bolus 1,000 mL (0 mLs Intravenous Stopping Infusion hung by another clincian 07/23/17 1955)  potassium chloride SA (K-DUR,KLOR-CON) CR tablet 40 mEq (40 mEq Oral Given 07/23/17 1613)  sodium chloride 0.9 % bolus 1,000 mL (0 mLs Intravenous Stopped 07/23/17 1704)  cefTRIAXone (ROCEPHIN) 1 g in sodium chloride 0.9 % 100 mL IVPB (0 g Intravenous Stopped 07/23/17 1643)    Mobility walks

## 2017-07-23 NOTE — ED Notes (Signed)
BLADDER SCAN RESULTS 0 mL's

## 2017-07-24 ENCOUNTER — Observation Stay (HOSPITAL_BASED_OUTPATIENT_CLINIC_OR_DEPARTMENT_OTHER): Payer: Medicare Other

## 2017-07-24 DIAGNOSIS — R55 Syncope and collapse: Secondary | ICD-10-CM | POA: Diagnosis not present

## 2017-07-24 DIAGNOSIS — B961 Klebsiella pneumoniae [K. pneumoniae] as the cause of diseases classified elsewhere: Secondary | ICD-10-CM | POA: Diagnosis not present

## 2017-07-24 DIAGNOSIS — I10 Essential (primary) hypertension: Secondary | ICD-10-CM | POA: Diagnosis not present

## 2017-07-24 DIAGNOSIS — Z794 Long term (current) use of insulin: Secondary | ICD-10-CM | POA: Diagnosis not present

## 2017-07-24 DIAGNOSIS — Z79899 Other long term (current) drug therapy: Secondary | ICD-10-CM | POA: Diagnosis not present

## 2017-07-24 DIAGNOSIS — G4733 Obstructive sleep apnea (adult) (pediatric): Secondary | ICD-10-CM | POA: Diagnosis present

## 2017-07-24 DIAGNOSIS — I361 Nonrheumatic tricuspid (valve) insufficiency: Secondary | ICD-10-CM | POA: Diagnosis not present

## 2017-07-24 DIAGNOSIS — E876 Hypokalemia: Secondary | ICD-10-CM | POA: Diagnosis not present

## 2017-07-24 DIAGNOSIS — H903 Sensorineural hearing loss, bilateral: Secondary | ICD-10-CM | POA: Diagnosis not present

## 2017-07-24 DIAGNOSIS — I4581 Long QT syndrome: Secondary | ICD-10-CM | POA: Diagnosis not present

## 2017-07-24 DIAGNOSIS — D72829 Elevated white blood cell count, unspecified: Secondary | ICD-10-CM | POA: Diagnosis not present

## 2017-07-24 DIAGNOSIS — Z981 Arthrodesis status: Secondary | ICD-10-CM | POA: Diagnosis not present

## 2017-07-24 DIAGNOSIS — Z7982 Long term (current) use of aspirin: Secondary | ICD-10-CM | POA: Diagnosis not present

## 2017-07-24 DIAGNOSIS — Z87891 Personal history of nicotine dependence: Secondary | ICD-10-CM | POA: Diagnosis not present

## 2017-07-24 DIAGNOSIS — R9082 White matter disease, unspecified: Secondary | ICD-10-CM | POA: Diagnosis not present

## 2017-07-24 DIAGNOSIS — N390 Urinary tract infection, site not specified: Secondary | ICD-10-CM | POA: Diagnosis not present

## 2017-07-24 DIAGNOSIS — Z833 Family history of diabetes mellitus: Secondary | ICD-10-CM | POA: Diagnosis not present

## 2017-07-24 DIAGNOSIS — E1165 Type 2 diabetes mellitus with hyperglycemia: Secondary | ICD-10-CM | POA: Diagnosis not present

## 2017-07-24 DIAGNOSIS — E785 Hyperlipidemia, unspecified: Secondary | ICD-10-CM | POA: Diagnosis not present

## 2017-07-24 DIAGNOSIS — D649 Anemia, unspecified: Secondary | ICD-10-CM | POA: Diagnosis present

## 2017-07-24 DIAGNOSIS — E1169 Type 2 diabetes mellitus with other specified complication: Secondary | ICD-10-CM | POA: Diagnosis not present

## 2017-07-24 DIAGNOSIS — R9089 Other abnormal findings on diagnostic imaging of central nervous system: Secondary | ICD-10-CM | POA: Diagnosis not present

## 2017-07-24 DIAGNOSIS — Z9089 Acquired absence of other organs: Secondary | ICD-10-CM | POA: Diagnosis not present

## 2017-07-24 DIAGNOSIS — Z6833 Body mass index (BMI) 33.0-33.9, adult: Secondary | ICD-10-CM | POA: Diagnosis not present

## 2017-07-24 DIAGNOSIS — E669 Obesity, unspecified: Secondary | ICD-10-CM | POA: Diagnosis not present

## 2017-07-24 DIAGNOSIS — Z86010 Personal history of colonic polyps: Secondary | ICD-10-CM | POA: Diagnosis not present

## 2017-07-24 LAB — CBC
HCT: 38.4 % — ABNORMAL LOW (ref 39.0–52.0)
HEMOGLOBIN: 12.1 g/dL — AB (ref 13.0–17.0)
MCH: 28.4 pg (ref 26.0–34.0)
MCHC: 31.5 g/dL (ref 30.0–36.0)
MCV: 90.1 fL (ref 78.0–100.0)
Platelets: 247 10*3/uL (ref 150–400)
RBC: 4.26 MIL/uL (ref 4.22–5.81)
RDW: 13.3 % (ref 11.5–15.5)
WBC: 19.7 10*3/uL — AB (ref 4.0–10.5)

## 2017-07-24 LAB — COMPREHENSIVE METABOLIC PANEL
ALT: 13 U/L — ABNORMAL LOW (ref 17–63)
AST: 13 U/L — ABNORMAL LOW (ref 15–41)
Albumin: 3 g/dL — ABNORMAL LOW (ref 3.5–5.0)
Alkaline Phosphatase: 43 U/L (ref 38–126)
Anion gap: 9 (ref 5–15)
BUN: 14 mg/dL (ref 6–20)
CHLORIDE: 101 mmol/L (ref 101–111)
CO2: 30 mmol/L (ref 22–32)
Calcium: 8.7 mg/dL — ABNORMAL LOW (ref 8.9–10.3)
Creatinine, Ser: 0.92 mg/dL (ref 0.61–1.24)
Glucose, Bld: 81 mg/dL (ref 65–99)
POTASSIUM: 3.8 mmol/L (ref 3.5–5.1)
SODIUM: 140 mmol/L (ref 135–145)
Total Bilirubin: 0.9 mg/dL (ref 0.3–1.2)
Total Protein: 6.2 g/dL — ABNORMAL LOW (ref 6.5–8.1)

## 2017-07-24 LAB — RESPIRATORY PANEL BY PCR
Adenovirus: NOT DETECTED
Bordetella pertussis: NOT DETECTED
CORONAVIRUS 229E-RVPPCR: NOT DETECTED
CORONAVIRUS OC43-RVPPCR: NOT DETECTED
Chlamydophila pneumoniae: NOT DETECTED
Coronavirus HKU1: NOT DETECTED
Coronavirus NL63: NOT DETECTED
INFLUENZA A-RVPPCR: NOT DETECTED
INFLUENZA B-RVPPCR: NOT DETECTED
METAPNEUMOVIRUS-RVPPCR: NOT DETECTED
Mycoplasma pneumoniae: NOT DETECTED
PARAINFLUENZA VIRUS 1-RVPPCR: NOT DETECTED
PARAINFLUENZA VIRUS 2-RVPPCR: NOT DETECTED
PARAINFLUENZA VIRUS 3-RVPPCR: NOT DETECTED
PARAINFLUENZA VIRUS 4-RVPPCR: NOT DETECTED
RESPIRATORY SYNCYTIAL VIRUS-RVPPCR: NOT DETECTED
RHINOVIRUS / ENTEROVIRUS - RVPPCR: NOT DETECTED

## 2017-07-24 LAB — GLUCOSE, CAPILLARY
GLUCOSE-CAPILLARY: 85 mg/dL (ref 65–99)
GLUCOSE-CAPILLARY: 262 mg/dL — AB (ref 65–99)
GLUCOSE-CAPILLARY: 173 mg/dL — AB (ref 65–99)
Glucose-Capillary: 125 mg/dL — ABNORMAL HIGH (ref 65–99)

## 2017-07-24 LAB — ECHOCARDIOGRAM COMPLETE
HEIGHTINCHES: 70 in
WEIGHTICAEL: 3788.38 [oz_av]

## 2017-07-24 LAB — TROPONIN I
Troponin I: <0.03 ng/mL (ref <0.03)
Troponin I: <0.03 ng/mL (ref <0.03)

## 2017-07-24 MED ORDER — INSULIN GLARGINE 100 UNIT/ML ~~LOC~~ SOLN
60.0000 [IU] | Freq: Every morning | SUBCUTANEOUS | Status: DC
  Administered 2017-07-24 – 2017-07-25 (×2): 60 [IU] via SUBCUTANEOUS
  Filled 2017-07-24 (×2): qty 0.6

## 2017-07-24 MED ORDER — GLUCERNA SHAKE PO LIQD
237.0000 mL | Freq: Two times a day (BID) | ORAL | Status: DC
  Administered 2017-07-24 – 2017-07-25 (×2): 237 mL via ORAL
  Filled 2017-07-24 (×3): qty 237

## 2017-07-24 MED ORDER — SODIUM CHLORIDE 0.9 % IV SOLN
INTRAVENOUS | Status: AC
  Administered 2017-07-24: 11:00:00 via INTRAVENOUS

## 2017-07-24 NOTE — Progress Notes (Signed)
Initial Nutrition Assessment  DOCUMENTATION CODES:   Obesity unspecified  INTERVENTION:   - D/C Ensure Enlive oral nutrition supplement to aid in blood glucose control - Glucerna Shake po BID, each supplement provides 220 kcal and 10 grams of protein  NUTRITION DIAGNOSIS:   Inadequate oral intake related to decreased appetite as evidenced by per patient/family report.  GOAL:   Patient will meet greater than or equal to 90% of their needs  MONITOR:   PO intake, Supplement acceptance, Weight trends, Labs  REASON FOR ASSESSMENT:   Malnutrition Screening Tool   ASSESSMENT:   67 year old pt who presented to ED with a syncopal episode after visit to PCP office for evaluation of urinary frequency. Pt with PMH significant for type 2 DM, HTN, and HLD. Pt admitted for syncope and UTI.  Spoke with pt at bedside who reports typically having a good appetite. Pt states appetite was poor on Wednesday of this past week but is improving. Pt states he ate "every bit" of his breakfast this AM. Pt usually eats 3 meals daily at home.  Pt states he is very hungry and would be agreeable to receiving Glucerna oral nutrition supplement in between meals rather than Ensure Enlive. RD to order.  Pt reports his UBW as 250 lbs and that he has lost 15 lbs in 30 days. Pt believes weight loss can be attributed to dehydration and "being on the wrong meds." Unable to confirm in chart. Per weight history in chart, pt has experienced a 6% weight loss in 5 months which is not significant for timeframe.  Pt with questions about diet sodas. RD answered questions and encouraged adequate water intake.  Medications reviewed and include: sliding scale Novolog TID with meals and daily at bedtime  Labs reviewed: AST 13, ALT 13 CBG's: 85, 85, 256, 102 x 24 hours  NUTRITION - FOCUSED PHYSICAL EXAM:    Most Recent Value  Orbital Region  No depletion  Upper Arm Region  No depletion  Thoracic and Lumbar Region  No  depletion  Buccal Region  No depletion  Temple Region  No depletion  Clavicle Bone Region  No depletion  Clavicle and Acromion Bone Region  No depletion  Scapular Bone Region  No depletion  Dorsal Hand  No depletion  Patellar Region  No depletion  Anterior Thigh Region  No depletion  Posterior Calf Region  Mild depletion  Edema (RD Assessment)  Mild  Hair  Reviewed  Eyes  Reviewed  Mouth  Reviewed  Skin  Reviewed  Nails  Reviewed       Diet Order:  Diet heart healthy/carb modified Room service appropriate? Yes; Fluid consistency: Thin  EDUCATION NEEDS:   Education needs have been addressed  Skin:  Skin Assessment: Reviewed RN Assessment  Last BM:  Unknown  Height:   Ht Readings from Last 1 Encounters:  07/23/17 5\' 10"  (1.778 m)    Weight:   Wt Readings from Last 1 Encounters:  07/24/17 236 lb 12.4 oz (107.4 kg)    Ideal Body Weight:  75.5 kg  BMI:  Body mass index is 33.97 kg/m.  Estimated Nutritional Needs:   Kcal:  2400-2600 kcal/day  Protein:  90-105 grams/day  Fluid:  2.4-2.6 L/day    Gaynell Face, MS, RD, LDN Pager: [phone] Weekend/After Hours: [phone]

## 2017-07-24 NOTE — Progress Notes (Signed)
Bilateral carotid duplex completed. Right 1% to 39%. Left - 40% to 59% by velocities. Plaque morphology does not support the increase. probably due to tortuosity. Actual stenosis probably closer to 1% to 39%. Bilateral vertebral artey flow is antegrade. [facility], [place] 07/24/2017 10:39

## 2017-07-24 NOTE — Progress Notes (Signed)
  Echocardiogram 2D Echocardiogram has been performed.  Nataleah Scioneaux T Yesli Vanderhoff 07/24/2017, 12:48 PM

## 2017-07-24 NOTE — Progress Notes (Signed)
PROGRESS NOTE    Kenneth [name]  NTI:[id number] DOB: [birth year] DOA: 07/23/2017 PCP: Crist Infante, MD   Brief Narrative:  Kenneth [name]  is a 67 y.o. male, w DM Type 2, Hypertension, Hyperlipidemia, apparently c/o frequency of urination for the past 2 days. Pt apparently went to PCP office for evaluation and then apparently had syncope.  Pt was sent by EMS for evaluation. Admitted for Syncope and UTI.  Assessment & Plan:   Principal Problem:   Syncope Active Problems:   Long Q-T syndrome   Leukocytosis   Hypokalemia   UTI (urinary tract infection)  Acute Syncopal Episode suspect Vasovagal Syncope -Head CT showed Mild diffuse cortical atrophy. Mild chronic ischemic white matter disease. No acute intracranial abnormality seen -C/w Telemetry; Checked Cardiac Troponin and was <0.03 x3 -Check ECHOCardiogram -Checked Orthostatic Vital Signs and patient was not Orthostatic -Carotid U/S done and showed Right 1% to 39%. Left - 40% to 59% by velocities. Plaque morphology does not support the increase. probably due to tortuosity. Actual stenosis probably closer to 1% to 39%. Bilateral vertebral artey flow is antegrade -Hold Chlorthalidone -S/p 2 Liter IVF NS Bolus; C/w NS at 75 mL/hr -PT/OT Evaluation and Treatment  Acute UTI -WBC went from 24.0 -> 19.7 -Urinalysis showed Small Hgb, Trace Leukocytes, >500 Glucose, No Bacteria Seen, but TNTC WBC -Urine Cx Pending -C/w Empiric Ceftriaxone -Patient has been on SGLT-2 Inhibitor and likely pre-disposed to UTI 2/2 to it.  -Blood Cx x2 never Drawn; Will draw now -Follow Cx's and adjust Abx as Necessary  Leukocytosis -Likely from UTI -Improving as WBC went from 24.0 -> 19.7 -Continue to Monitor and Repeat CBC in AM  Hypokalemia -Patient's K+ was 3.3 and improved to 3.8 after Repletion -Continue to Monitor and Replete as Necessary -Repeat CMP in AM  Normocytic Anemia -Check Anemia Panel -Hb/Hct went from 12.9/39.4 ->  12.1/38.4 -Continue to Monitor for S/Sx of Bleeding -Repeat CBC in AM  Diabetes Mellitus Type 2 -Hold Home Metformin 1000 mg po BID, Jardiance 25 mg po Daily, and Novolog 16 units TID -Takes 60 units of Toujeo in the AM; Restart Home Long Acting Insulin  -C/w Sensitive Novolog SSI AC/HS -Check HbA1c in AM  -CBG's ranging from 85-262  HLD -Check FLP in AM   Hypertension -C/w Home Benazepril 40 mg po Daily -Hold Home Chlorthalidone currently  OSA -Non-compliant with CPAP  DVT prophylaxis: Enoxaparin 40 mg sq q24h Code Status: FULL CODE Family Communication: No family present at bedside  Disposition Plan: Anticipate D/C within next 24-48 hours  Consultants: None   Procedures:  CAROTID U/S Bilateral carotid duplex completed. Right 1% to 39%. Left - 40% to 59% by velocities. Plaque morphology does not support the increase. probably due to tortuosity. Actual stenosis probably closer to 1% to 39%. Bilateral vertebral artey flow is antegrade  ECHOCARDIOGRAM   Antimicrobials:  Anti-infectives (From admission, onward)   Start     Dose/Rate Route Frequency Ordered Stop   07/24/17 1400  cefTRIAXone (ROCEPHIN) 1 g in sodium chloride 0.9 % 100 mL IVPB     1 g 200 mL/hr over 30 Minutes Intravenous Every 24 hours 07/23/17 2302     07/23/17 2300  cefTRIAXone (ROCEPHIN) 1 g in sodium chloride 0.9 % 100 mL IVPB  Status:  Discontinued     1 g 200 mL/hr over 30 Minutes Intravenous Every 24 hours 07/23/17 2249 07/23/17 2301   07/23/17 1615  cefTRIAXone (ROCEPHIN) 1 g in sodium chloride 0.9 % 100 mL IVPB  1 g 200 mL/hr over 30 Minutes Intravenous  Once 07/23/17 1604 07/23/17 1643     Subjective: Seen and examined and felt that he may have White Coat Syndrome and thinks that's why he passed out. No CP or SOB. No nausea. Urinating frequently. No other concerns or complaints at this time.   Objective: Vitals:   07/23/17 2000 07/23/17 2200 07/23/17 2303 07/24/17 0639  BP: (!) 115/55  (!) 121/56 (!) 128/58 (!) 98/43  Pulse: (!) 58 69 61 60  Resp: 17 (!) 24 (!) 22 (!) 22  Temp:   98 F (36.7 C) 98.8 F (37.1 C)  TempSrc:   Oral Oral  SpO2: 99% 99% 98% 100%  Weight:   107.5 kg (236 lb 15.9 oz) 107.4 kg (236 lb 12.4 oz)  Height:   5\' 10"  (1.778 m)     Intake/Output Summary (Last 24 hours) at 07/24/2017 0759 Last data filed at 07/24/2017 4259 Gross per 24 hour  Intake 290 ml  Output 500 ml  Net -210 ml   Filed Weights   07/23/17 2303 07/24/17 0639  Weight: 107.5 kg (236 lb 15.9 oz) 107.4 kg (236 lb 12.4 oz)   Examination: Physical Exam:  Constitutional: WN/WD obese Caucasian male inNAD and appears calm and comfortable Eyes: Lids and conjunctivae normal, sclerae anicteric  ENMT: External Ears, Nose appear normal. Slightly hard of hearing. Mucous membranes are moist.  Neck: Appears normal, supple, no cervical masses, normal ROM, no appreciable thyromegaly, no JVD Respiratory: Diminished to auscultation bilaterally, no wheezing, rales, rhonchi or crackles. Normal respiratory effort and patient is not tachypenic. No accessory muscle use.  Cardiovascular: RRR, no murmurs / rubs / gallops. S1 and S2 auscultated. No extremity edema.  Abdomen: Soft, non-tender, non-distended. No masses palpated. No appreciable hepatosplenomegaly. Bowel sounds positive x4.  GU: Deferred. Musculoskeletal: No clubbing / cyanosis of digits/nails. No joint deformity upper and lower extremities. Skin: No rashes, lesions, ulcers on a limited skin evaluation. No induration; Warm and dry.  Neurologic: CN 2-12 grossly intact with no focal deficits. Romberg sign and cerebellar reflexes not assessed.  Psychiatric: Normal judgment and insight. Alert and oriented x 3. Normal mood and appropriate affect.   Data Reviewed: I have personally reviewed following labs and imaging studies  CBC: Recent Labs  Lab 07/23/17 1445 07/24/17 0425  WBC 24.0* 19.7*  NEUTROABS 19.6*  --   HGB 12.9* 12.1*  HCT  39.4 38.4*  MCV 88.5 90.1  PLT 267 563   Basic Metabolic Panel: Recent Labs  Lab 07/23/17 1445 07/24/17 0425  NA 142 140  K 3.3* 3.8  CL 99* 101  CO2 29 30  GLUCOSE 113* 81  BUN 16 14  CREATININE 0.95 0.92  CALCIUM 9.2 8.7*  MG 1.8  --    GFR: Estimated Creatinine Clearance: 94.3 mL/min (by C-G formula based on SCr of 0.92 mg/dL). Liver Function Tests: Recent Labs  Lab 07/23/17 1445 07/24/17 0425  AST 17 13*  ALT 16* 13*  ALKPHOS 41 43  BILITOT 1.0 0.9  PROT 6.8 6.2*  ALBUMIN 3.3* 3.0*   No results for input(s): LIPASE, AMYLASE in the last 168 hours. No results for input(s): AMMONIA in the last 168 hours. Coagulation Profile: No results for input(s): INR, PROTIME in the last 168 hours. Cardiac Enzymes: Recent Labs  Lab 07/23/17 2307 07/24/17 0425  TROPONINI <0.03 <0.03   BNP (last 3 results) Recent Labs    02/10/17 1306  PROBNP 36   HbA1C: No results for input(s):  HGBA1C in the last 72 hours. CBG: Recent Labs  Lab 07/23/17 1414 07/23/17 1905 07/23/17 2311 07/24/17 0735  GLUCAP 102* 256* 85 85   Lipid Profile: No results for input(s): CHOL, HDL, LDLCALC, TRIG, CHOLHDL, LDLDIRECT in the last 72 hours. Thyroid Function Tests: No results for input(s): TSH, T4TOTAL, FREET4, T3FREE, THYROIDAB in the last 72 hours. Anemia Panel: No results for input(s): VITAMINB12, FOLATE, FERRITIN, TIBC, IRON, RETICCTPCT in the last 72 hours. Sepsis Labs: Recent Labs  Lab 07/23/17 1525 07/23/17 1551  LATICACIDVEN 1.34 1.81    Recent Results (from the past 240 hour(s))  Respiratory Panel by PCR     Status: None   Collection Time: 07/23/17  3:15 PM  Result Value Ref Range Status   Adenovirus NOT DETECTED NOT DETECTED Final   Coronavirus 229E NOT DETECTED NOT DETECTED Final   Coronavirus HKU1 NOT DETECTED NOT DETECTED Final   Coronavirus NL63 NOT DETECTED NOT DETECTED Final   Coronavirus OC43 NOT DETECTED NOT DETECTED Final   Metapneumovirus NOT DETECTED NOT  DETECTED Final   Rhinovirus / Enterovirus NOT DETECTED NOT DETECTED Final   Influenza A NOT DETECTED NOT DETECTED Final   Influenza B NOT DETECTED NOT DETECTED Final   Parainfluenza Virus 1 NOT DETECTED NOT DETECTED Final   Parainfluenza Virus 2 NOT DETECTED NOT DETECTED Final   Parainfluenza Virus 3 NOT DETECTED NOT DETECTED Final   Parainfluenza Virus 4 NOT DETECTED NOT DETECTED Final   Respiratory Syncytial Virus NOT DETECTED NOT DETECTED Final   Bordetella pertussis NOT DETECTED NOT DETECTED Final   Chlamydophila pneumoniae NOT DETECTED NOT DETECTED Final   Mycoplasma pneumoniae NOT DETECTED NOT DETECTED Final    Comment: Performed at [facility], [place] [address], [place], [place] [address]    Radiology Studies: Ct Head Wo Contrast  Result Date: 07/23/2017 CLINICAL DATA:  Seizure. EXAM: CT HEAD WITHOUT CONTRAST TECHNIQUE: Contiguous axial images were obtained from the base of the skull through the vertex without intravenous contrast. COMPARISON:  None. FINDINGS: Brain: Mild diffuse cortical atrophy is noted. Mild chronic ischemic white matter disease is noted. No mass effect or midline shift is noted. Ventricular size is within normal limits. There is no evidence of mass lesion, hemorrhage or acute infarction. Vascular: No hyperdense vessel or unexpected calcification. Skull: Normal. Negative for fracture or focal lesion. Sinuses/Orbits: No acute finding. Other: None. IMPRESSION: Mild diffuse cortical atrophy. Mild chronic ischemic white matter disease. No acute intracranial abnormality seen. Electronically Signed   By: Marijo Conception, M.D.   On: 07/23/2017 15:50   Dg Chest Port 1 View  Result Date: 07/23/2017 CLINICAL DATA:  Syncope and dehydration EXAM: PORTABLE CHEST 1 VIEW COMPARISON:  Sep 30, 2012 FINDINGS: The heart size and mediastinal contours are stable. The heart size is enlarged. There is a small right pleural effusion. Mild atelectasis of right lung base is noted. There is  no focal pneumonia or pulmonary edema. The visualized skeletal structures are stable. IMPRESSION: Small right pleural effusion with mild atelectasis of right lung base. Electronically Signed   By: Abelardo Diesel M.D.   On: 07/23/2017 14:24   Scheduled Meds: . aspirin EC  81 mg Oral Daily  . benazepril  40 mg Oral Daily  . chlorthalidone  25 mg Oral Daily  . enoxaparin (LOVENOX) injection  40 mg Subcutaneous QHS  . feeding supplement (ENSURE ENLIVE)  237 mL Oral BID BM  . insulin aspart  0-5 Units Subcutaneous QHS  . insulin aspart  0-9  Units Subcutaneous TID WC   Continuous Infusions: . cefTRIAXone (ROCEPHIN)  IV    . sodium chloride Stopped (07/23/17 1955)    LOS: 0 days   Kerney Elbe, DO [facility] Pager [phone]  If 7PM-7AM, please contact night-coverage [email or web address] Password [facility] 07/24/2017, 7:59 AM

## 2017-07-24 NOTE — Progress Notes (Signed)
OT Cancellation Note  Patient Details Name: Kenneth [name] MRN: [id number] DOB: [birth year]   Cancelled Treatment:    Reason Eval/Treat Not Completed: OT screened, no needs identified, will sign off.  Spoke to pt and wife. He feels like he is ready to go home and go back to work. He doesn't feel he needs OT or PT.  Cecila Satcher 07/24/2017, 2:37 PM  Lesle Chris, OTR/L [phone] 07/24/2017

## 2017-07-25 DIAGNOSIS — E669 Obesity, unspecified: Secondary | ICD-10-CM

## 2017-07-25 DIAGNOSIS — E1169 Type 2 diabetes mellitus with other specified complication: Secondary | ICD-10-CM

## 2017-07-25 LAB — URINE CULTURE

## 2017-07-25 LAB — LIPID PANEL
CHOL/HDL RATIO: 3.6 ratio
CHOLESTEROL: 132 mg/dL (ref 0–200)
HDL: 37 mg/dL — ABNORMAL LOW (ref >40)
LDL Cholesterol: 75 mg/dL (ref 0–99)
TRIGLYCERIDES: 102 mg/dL (ref <150)
VLDL: 20 mg/dL (ref 0–40)

## 2017-07-25 LAB — GLUCOSE, CAPILLARY: Glucose-Capillary: 101 mg/dL — ABNORMAL HIGH (ref 65–99)

## 2017-07-25 LAB — CBC WITH DIFFERENTIAL/PLATELET
Basophils Absolute: 0 10*3/uL (ref 0.0–0.1)
Basophils Relative: 0 %
EOS ABS: 0.2 10*3/uL (ref 0.0–0.7)
Eosinophils Relative: 2 %
HCT: 38.1 % — ABNORMAL LOW (ref 39.0–52.0)
Hemoglobin: 12.1 g/dL — ABNORMAL LOW (ref 13.0–17.0)
LYMPHS ABS: 2.6 10*3/uL (ref 0.7–4.0)
LYMPHS PCT: 20 %
MCH: 28.3 pg (ref 26.0–34.0)
MCHC: 31.8 g/dL (ref 30.0–36.0)
MCV: 89.2 fL (ref 78.0–100.0)
MONO ABS: 1.4 10*3/uL — AB (ref 0.1–1.0)
MONOS PCT: 11 %
Neutro Abs: 8.4 10*3/uL — ABNORMAL HIGH (ref 1.7–7.7)
Neutrophils Relative %: 67 %
Platelets: 271 10*3/uL (ref 150–400)
RBC: 4.27 MIL/uL (ref 4.22–5.81)
RDW: 13.1 % (ref 11.5–15.5)
WBC: 12.5 10*3/uL — AB (ref 4.0–10.5)

## 2017-07-25 LAB — COMPREHENSIVE METABOLIC PANEL
ALBUMIN: 2.8 g/dL — AB (ref 3.5–5.0)
ALK PHOS: 41 U/L (ref 38–126)
ALT: 13 U/L — AB (ref 17–63)
ANION GAP: 8 (ref 5–15)
AST: 15 U/L (ref 15–41)
BILIRUBIN TOTAL: 0.3 mg/dL (ref 0.3–1.2)
BUN: 14 mg/dL (ref 6–20)
CALCIUM: 8.5 mg/dL — AB (ref 8.9–10.3)
CO2: 31 mmol/L (ref 22–32)
CREATININE: 0.75 mg/dL (ref 0.61–1.24)
Chloride: 102 mmol/L (ref 101–111)
GFR calc Af Amer: >60 mL/min (ref >60)
GFR calc non Af Amer: >60 mL/min (ref >60)
Glucose, Bld: 90 mg/dL (ref 65–99)
Potassium: 3.6 mmol/L (ref 3.5–5.1)
Sodium: 141 mmol/L (ref 135–145)
TOTAL PROTEIN: 6.1 g/dL — AB (ref 6.5–8.1)

## 2017-07-25 LAB — PHOSPHORUS: Phosphorus: 2.4 mg/dL — ABNORMAL LOW (ref 2.5–4.6)

## 2017-07-25 LAB — IRON AND TIBC
IRON: 38 ug/dL — AB (ref 45–182)
SATURATION RATIOS: 19 % (ref 17.9–39.5)
TIBC: 203 ug/dL — AB (ref 250–450)
UIBC: 165 ug/dL

## 2017-07-25 LAB — RETICULOCYTES
RBC.: 4.27 MIL/uL (ref 4.22–5.81)
RETIC CT PCT: 1.8 % (ref 0.4–3.1)
Retic Count, Absolute: 76.9 10*3/uL (ref 19.0–186.0)

## 2017-07-25 LAB — HEMOGLOBIN A1C
Hgb A1c MFr Bld: 8.1 % — ABNORMAL HIGH (ref 4.8–5.6)
Mean Plasma Glucose: 185.77 mg/dL

## 2017-07-25 LAB — FERRITIN: Ferritin: 286 ng/mL (ref 24–336)

## 2017-07-25 LAB — MAGNESIUM: Magnesium: 2 mg/dL (ref 1.7–2.4)

## 2017-07-25 LAB — FOLATE: Folate: 10 ng/mL (ref >5.9)

## 2017-07-25 LAB — VITAMIN B12: Vitamin B-12: 127 pg/mL — ABNORMAL LOW (ref 180–914)

## 2017-07-25 MED ORDER — CEPHALEXIN 500 MG PO CAPS
500.0000 mg | ORAL_CAPSULE | Freq: Three times a day (TID) | ORAL | Status: DC
  Administered 2017-07-25: 500 mg via ORAL
  Filled 2017-07-25 (×2): qty 1

## 2017-07-25 MED ORDER — CEPHALEXIN 500 MG PO CAPS: 500.0000 mg | ORAL_CAPSULE | Freq: Three times a day (TID) | ORAL | 0 refills | Status: AC

## 2017-07-25 MED ORDER — GLUCERNA SHAKE PO LIQD: 237.0000 mL | Freq: Two times a day (BID) | ORAL | 0 refills | Status: DC

## 2017-07-27 LAB — GLUCOSE, CAPILLARY: GLUCOSE-CAPILLARY: 119 mg/dL — AB (ref 65–99)

## 2017-07-29 LAB — CULTURE, BLOOD (ROUTINE X 2)
CULTURE: NO GROWTH
CULTURE: NO GROWTH
Special Requests: ADEQUATE
Special Requests: ADEQUATE

## 2017-07-29 NOTE — Discharge Summary (Signed)
Physician Discharge Summary  Kenneth [name] PFX:[id number] DOB: 11/16/1950 DOA: 07/23/2017  PCP: Crist Infante, MD  Admit date: 07/23/2017 Discharge date: 07/29/2017  Admitted From: Home Disposition: Home  Recommendations for Outpatient Follow-up:  1. Follow up with PCP in 1-2 weeks 2. Please obtain CMP/CBC, Mag, Phos in one week 3. Please follow up on the following pending results:  Home Health: No  Equipment/Devices: None  Discharge Condition: Stable  CODE STATUS: FULL CODE Diet recommendation: Heart Healthy Diet   Brief/Interim Summary: [name] a66 y.o.male,w DM Type 2, Hypertension, Hyperlipidemia, apparently c/o frequency of urination for the past 2 days. Pt apparently went to PCP office for evaluation and then apparently had syncope. Pt was sent by EMS for evaluation. Admitted for Syncope and UTI and was found to have a Klebsiella UTI. Abx where transitioned to po and Patient improved. He was deemed medically stable to follow up with PCP.   Discharge Diagnoses:  Principal Problem:   Syncope Active Problems:   Long Q-T syndrome   Leukocytosis   Hypokalemia   UTI (urinary tract infection)  Acute Syncopal Episode suspect Vasovagal Syncope -Head CT showed Mild diffuse cortical atrophy. Mild chronic ischemic white matter disease. No acute intracranial abnormality seen -C/w Telemetry; Checked Cardiac Troponin and was <0.03 x3 -Checked ECHOCardiogram as below but showed EF of 60-65% -Checked Orthostatic Vital Signs and patient was not Orthostatic -Carotid U/S done and showed Right 1% to 39%. Left - 40% to 59% by velocities. Plaque morphology does not support the increase. probably due to tortuosity. Actual stenosis probably closer to 1% to 39%. Bilateral vertebral artey flow is antegrade -Stop Chlorthalidone -S/p 2 Liter IVF NS Bolus; C/w NS at 75 mL/hr -PT/OT Evaluation and Treatment and did not have any needs.   Acute Klebsiella UTI -WBC went from 24.0 ->  19.7 -> 12.5 -Urinalysis showed Small Hgb, Trace Leukocytes, >500 Glucose, No Bacteria Seen, but TNTC WBC -Urine Cx showed sensitivities -Changed Empiric Ceftriaxone to po Abx -Patient has been on SGLT-2 Inhibitor and likely pre-disposed to UTI 2/2 to it.  -Blood Cx x2 never Drawn; Showed NGTD -Follow Up with PCP   Leukocytosis -Likely from UTI -Improving as WBC went from 24.0 -> 19.7 -> 12.5 -Continue to Monitor and Repeat CBC in AM  Hypokalemia -Patient's K+ was 3.3 and improved to 3.6 after Repletion -Continue to Monitor and Replete as Necessary -Repeat CMP as an outpatient   Normocytic Anemia -Checked Anemia Panel and Iron was 38, UIBC was 165, TIBC was 203, Saturation Ratio was 19, Ferritin was 286, Folate was 10.0, and Vitamin B12 was 127 -Hb/Hct went from 12.9/39.4 -> 12.1/38.1 -Continue to Monitor for S/Sx of Bleeding -Start Vitamin B Supplementation as an outpatient  -Repeat CBC as an outpatient   Diabetes Mellitus Type 2 -C/w Home Metformin 1000 mg po BID  and Novolog 16 units TID -Stop Jardiance at D/c  -Takes 60 units of Toujeo in the AM; Restart Home Long Acting Insulin   -C/w Sensitive Novolog SSI AC/HS while hospitalized  -Checked HbA1c and was 8.1 -CBG's ranging from 101-173 -Follow up with PCP as an outpatient   HLD -Lipid Panel showed Cholesterol of 132, HDL of 37, LDL of 75, TG of 102, VLDL of 20   Hypertension -C/w Home Benazepril 40 mg po Daily -Stop Home Chlorthalidone currently  OSA -Non-compliant with CPAP -Encouraged Compliance  Discharge Instructions  Discharge Instructions    Call MD for:  difficulty breathing, headache or visual disturbances   Complete by:  As directed    Call MD for:  extreme fatigue   Complete by:  As directed    Call MD for:  hives   Complete by:  As directed    Call MD for:  persistant dizziness or light-headedness   Complete by:  As directed    Call MD for:  persistant nausea and vomiting   Complete by:   As directed    Call MD for:  redness, tenderness, or signs of infection (pain, swelling, redness, odor or green/yellow discharge around incision site)   Complete by:  As directed    Call MD for:  severe uncontrolled pain   Complete by:  As directed    Call MD for:  temperature >100.4   Complete by:  As directed    Diet - low sodium heart healthy   Complete by:  As directed    Diet Carb Modified   Complete by:  As directed    Discharge instructions   Complete by:  As directed    Follow up with PCP at D/C. Take all medications as prescribed. If symptoms change or worsen please return to the ED for evaluation.   Increase activity slowly   Complete by:  As directed      Allergies as of 07/25/2017      Reactions   Penicillins    CHILDHOOD ALLERGY Has patient had a PCN reaction causing immediate rash, facial/tongue/throat swelling, SOB or lightheadedness with hypotension: Unknown Has patient had a PCN reaction causing severe rash involving mucus membranes or skin necrosis: Unknown Has patient had a PCN reaction that required hospitalization: Unknown Has patient had a PCN reaction occurring within the last 10 years: Unknown If all of the above answers are "NO", then may proceed with Cephalosporin use.      Medication List    STOP taking these medications   chlorthalidone 25 MG tablet Commonly known as:  HYGROTON   JARDIANCE 25 MG Tabs tablet Generic drug:  empagliflozin     TAKE these medications   acetaminophen 500 MG tablet Commonly known as:  TYLENOL Take 500 mg by mouth daily as needed for headache.   alendronate 70 MG tablet Commonly known as:  FOSAMAX Take 1 tablet by mouth once a week.   aspirin 81 MG tablet Take 81 mg by mouth daily.   B-D UF III MINI PEN NEEDLES 31G X 5 MM Misc Generic drug:  Insulin Pen Needle Inject 1 Stick as directed as directed.   benazepril 40 MG tablet Commonly known as:  LOTENSIN Take 1 tablet (40 mg total) by mouth daily.    calcium carbonate 600 MG Tabs tablet Commonly known as:  OS-CAL Take 600 mg by mouth 2 (two) times daily with a meal.   cephALEXin 500 MG capsule Commonly known as:  KEFLEX Take 1 capsule (500 mg total) by mouth every 8 (eight) hours for 5 days.   diphenhydramine-acetaminophen 25-500 MG Tabs tablet Commonly known as:  TYLENOL PM Take 1 tablet by mouth at bedtime as needed (SLEEP).   feeding supplement (GLUCERNA SHAKE) Liqd Take 237 mLs by mouth 2 (two) times daily between meals.   metFORMIN 1000 MG tablet Commonly known as:  GLUCOPHAGE Take 1,000 mg by mouth 2 (two) times daily with a meal.   NOVOLOG FLEXPEN 100 UNIT/ML FlexPen Generic drug:  insulin aspart Inject 16 Units as directed 3 (three) times daily.   TOUJEO MAX SOLOSTAR 300 UNIT/ML Sopn Generic drug:  Insulin Glargine Inject 60 Units into  the skin every morning.       Allergies  Allergen Reactions  . Penicillins     CHILDHOOD ALLERGY Has patient had a PCN reaction causing immediate rash, facial/tongue/throat swelling, SOB or lightheadedness with hypotension: Unknown Has patient had a PCN reaction causing severe rash involving mucus membranes or skin necrosis: Unknown Has patient had a PCN reaction that required hospitalization: Unknown Has patient had a PCN reaction occurring within the last 10 years: Unknown If all of the above answers are "NO", then may proceed with Cephalosporin use.     Consultations:  None  Procedures/Studies: Ct Head Wo Contrast  Result Date: 07/23/2017 CLINICAL DATA:  Seizure. EXAM: CT HEAD WITHOUT CONTRAST TECHNIQUE: Contiguous axial images were obtained from the base of the skull through the vertex without intravenous contrast. COMPARISON:  None. FINDINGS: Brain: Mild diffuse cortical atrophy is noted. Mild chronic ischemic white matter disease is noted. No mass effect or midline shift is noted. Ventricular size is within normal limits. There is no evidence of mass lesion,  hemorrhage or acute infarction. Vascular: No hyperdense vessel or unexpected calcification. Skull: Normal. Negative for fracture or focal lesion. Sinuses/Orbits: No acute finding. Other: None. IMPRESSION: Mild diffuse cortical atrophy. Mild chronic ischemic white matter disease. No acute intracranial abnormality seen. Electronically Signed   By: Marijo Conception, M.D.   On: 07/23/2017 15:50   Dg Chest Port 1 View  Result Date: 07/23/2017 CLINICAL DATA:  Syncope and dehydration EXAM: PORTABLE CHEST 1 VIEW COMPARISON:  Sep 30, 2012 FINDINGS: The heart size and mediastinal contours are stable. The heart size is enlarged. There is a small right pleural effusion. Mild atelectasis of right lung base is noted. There is no focal pneumonia or pulmonary edema. The visualized skeletal structures are stable. IMPRESSION: Small right pleural effusion with mild atelectasis of right lung base. Electronically Signed   By: Abelardo Diesel M.D.   On: 07/23/2017 14:24    ECHOCARDIOGRAM ------------------------------------------------------------------- Study Conclusions  - Left ventricle: The cavity size was normal. There was mild   concentric hypertrophy. Systolic function was normal. The   estimated ejection fraction was in the range of 60% to 65%. Wall   motion was normal; there were no regional wall motion   abnormalities. Left ventricular diastolic function parameters   were normal. - Aortic valve: Valve area (Vmax): 1.99 cm^2. - Right ventricle: The cavity size was mildly dilated. Wall   thickness was normal. - Pulmonary arteries: Systolic pressure was moderately increased.   PA peak pressure: 43 mm Hg (S).  Impressions:  - There is no significant change since the prior study on   02/13/2017.  CAROTID DUPLEX Bilateral carotid duplex completed. Right 1% to 39%. Left - 40% to 59% by velocities. Plaque morphology does not support the increase. probably due to tortuosity. Actual stenosis probably closer to  1% to 39%. Bilateral vertebral artey flow is antegrade.  Subjective: Seen and examined and felt well. Improved. No CP, SOB, Lightheadedness or Dizziness. Ready to go home.  Discharge Exam: Vitals:   07/24/17 2044 07/25/17 0705  BP: (!) 128/58 (!) 146/63  Pulse: (!) 56 (!) 55  Resp: 18 16  Temp: 98.1 F (36.7 C) 97.6 F (36.4 C)  SpO2: 94% 97%   Vitals:   07/24/17 0639 07/24/17 1322 07/24/17 2044 07/25/17 0705  BP: (!) 98/43 (!) 118/59 (!) 128/58 (!) 146/63  Pulse: 60 (!) 56 (!) 56 (!) 55  Resp: (!) 22 18 18 16   Temp: 98.8 F (37.1 C)  98.4 F (36.9 C) 98.1 F (36.7 C) 97.6 F (36.4 C)  TempSrc: Oral Oral Oral Oral  SpO2: 100% 96% 94% 97%  Weight: 107.4 kg (236 lb 12.4 oz)   107.3 kg (236 lb 8.9 oz)  Height:       General: Pt is alert, awake, not in acute distress Cardiovascular: RRR, S1/S2 +, no rubs, no gallops Respiratory: CTA bilaterally, no wheezing, no rhonchi Abdominal: Soft, NT, Distended due to body habitus, bowel sounds + Extremities: no edema, no cyanosis  The results of significant diagnostics from this hospitalization (including imaging, microbiology, ancillary and laboratory) are listed below for reference.    Microbiology: Recent Results (from the past 240 hour(s))  Urine culture     Status: Abnormal   Collection Time: 07/23/17  3:15 PM  Result Value Ref Range Status   Specimen Description   Final    URINE, CLEAN CATCH Performed at [facility] [address] [place], [place] [address]    Special Requests   Final    NONE Performed at [facility], [place] [address], [place], [place] [address]    Culture >=100,000 COLONIES/mL KLEBSIELLA PNEUMONIAE (A)  Final   Report Status 07/25/2017 FINAL  Final   Organism ID, Bacteria KLEBSIELLA PNEUMONIAE (A)  Final      Susceptibility   Klebsiella pneumoniae - MIC*    AMPICILLIN >=32 RESISTANT Resistant     CEFAZOLIN <=4 SENSITIVE Sensitive     CEFTRIAXONE <=1 SENSITIVE  Sensitive     CIPROFLOXACIN <=0.25 SENSITIVE Sensitive     GENTAMICIN <=1 SENSITIVE Sensitive     IMIPENEM <=0.25 SENSITIVE Sensitive     NITROFURANTOIN <=16 SENSITIVE Sensitive     TRIMETH/SULFA <=20 SENSITIVE Sensitive     AMPICILLIN/SULBACTAM 8 SENSITIVE Sensitive     PIP/TAZO 8 SENSITIVE Sensitive     Extended ESBL NEGATIVE Sensitive     * >=100,000 COLONIES/mL KLEBSIELLA PNEUMONIAE  Respiratory Panel by PCR     Status: None   Collection Time: 07/23/17  3:15 PM  Result Value Ref Range Status   Adenovirus NOT DETECTED NOT DETECTED Final   Coronavirus 229E NOT DETECTED NOT DETECTED Final   Coronavirus HKU1 NOT DETECTED NOT DETECTED Final   Coronavirus NL63 NOT DETECTED NOT DETECTED Final   Coronavirus OC43 NOT DETECTED NOT DETECTED Final   Metapneumovirus NOT DETECTED NOT DETECTED Final   Rhinovirus / Enterovirus NOT DETECTED NOT DETECTED Final   Influenza A NOT DETECTED NOT DETECTED Final   Influenza B NOT DETECTED NOT DETECTED Final   Parainfluenza Virus 1 NOT DETECTED NOT DETECTED Final   Parainfluenza Virus 2 NOT DETECTED NOT DETECTED Final   Parainfluenza Virus 3 NOT DETECTED NOT DETECTED Final   Parainfluenza Virus 4 NOT DETECTED NOT DETECTED Final   Respiratory Syncytial Virus NOT DETECTED NOT DETECTED Final   Bordetella pertussis NOT DETECTED NOT DETECTED Final   Chlamydophila pneumoniae NOT DETECTED NOT DETECTED Final   Mycoplasma pneumoniae NOT DETECTED NOT DETECTED Final    Comment: Performed at [facility], [place]. [address], [place], [place] [address]  Culture, blood (routine x 2)     Status: None   Collection Time: 07/24/17  1:48 PM  Result Value Ref Range Status   Specimen Description   Final    BLOOD RIGHT HAND Performed at [facility], [place] [address], [place], [place] [address]    Special Requests   Final    BOTTLES DRAWN AEROBIC AND ANAEROBIC Blood Culture  adequate volume Performed at [facility] [address],  [place], [place] [address]    Culture   Final    NO GROWTH 5 DAYS Performed at [facility], [place] [address] [place], [place] [address]    Report Status 07/29/2017 FINAL  Final  Culture, blood (routine x 2)     Status: None   Collection Time: 07/24/17  1:48 PM  Result Value Ref Range Status   Specimen Description   Final    BLOOD RIGHT HAND Performed at [facility], [place] [address], [place], [place] [address]    Special Requests   Final    BOTTLES DRAWN AEROBIC AND ANAEROBIC Blood Culture adequate volume Performed at [facility] [address], [place], [place] [address]    Culture   Final    NO GROWTH 5 DAYS Performed at [facility], [place] [address], [place], [place] [address]    Report Status 07/29/2017 FINAL  Final    Labs: BNP (last 3 results) Recent Labs    07/23/17 1445  BNP 70.3   Basic Metabolic Panel: Recent Labs  Lab 07/23/17 1445 07/24/17 0425 07/25/17 0442  NA 142 140 141  K 3.3* 3.8 3.6  CL 99* 101 102  CO2 29 30 31   GLUCOSE 113* 81 90  BUN 16 14 14   CREATININE 0.95 0.92 0.75  CALCIUM 9.2 8.7* 8.5*  MG 1.8  --  2.0  PHOS  --   --  2.4*   Liver Function Tests: Recent Labs  Lab 07/23/17 1445 07/24/17 0425 07/25/17 0442  AST 17 13* 15  ALT 16* 13* 13*  ALKPHOS 41 43 41  BILITOT 1.0 0.9 0.3  PROT 6.8 6.2* 6.1*  ALBUMIN 3.3* 3.0* 2.8*   No results for input(s): LIPASE, AMYLASE in the last 168 hours. No results for input(s): AMMONIA in the last 168 hours. CBC: Recent Labs  Lab 07/23/17 1445 07/24/17 0425 07/25/17 0442  WBC 24.0* 19.7* 12.5*  NEUTROABS 19.6*  --  8.4*  HGB 12.9* 12.1* 12.1*  HCT 39.4 38.4* 38.1*  MCV 88.5 90.1 89.2  PLT 267 247 271   Cardiac Enzymes: Recent Labs  Lab 07/23/17 2307 07/24/17 0425 07/24/17 1045  TROPONINI <0.03 <0.03 <0.03   BNP: Invalid input(s): POCBNP CBG: Recent Labs  Lab 07/24/17 1202 07/24/17 1653 07/24/17 2051 07/25/17 0755 07/25/17 1200  GLUCAP  262* 125* 173* 101* 119*   D-Dimer No results for input(s): DDIMER in the last 72 hours. Hgb A1c No results for input(s): HGBA1C in the last 72 hours. Lipid Profile No results for input(s): CHOL, HDL, LDLCALC, TRIG, CHOLHDL, LDLDIRECT in the last 72 hours. Thyroid function studies No results for input(s): TSH, T4TOTAL, T3FREE, THYROIDAB in the last 72 hours.  Invalid input(s): FREET3 Anemia work up No results for input(s): VITAMINB12, FOLATE, FERRITIN, TIBC, IRON, RETICCTPCT in the last 72 hours. Urinalysis    Component Value Date/Time   COLORURINE YELLOW 07/23/2017 1514   APPEARANCEUR CLEAR 07/23/2017 1514   LABSPEC 1.028 07/23/2017 1514   PHURINE 5.0 07/23/2017 1514   GLUCOSEU >=500 (A) 07/23/2017 1514   HGBUR SMALL (A) 07/23/2017 1514   BILIRUBINUR NEGATIVE 07/23/2017 1514   KETONESUR 5 (A) 07/23/2017 1514   PROTEINUR 100 (A) 07/23/2017 1514   NITRITE NEGATIVE 07/23/2017 1514   LEUKOCYTESUR TRACE (A) 07/23/2017 1514   Sepsis Labs Invalid input(s): PROCALCITONIN,  WBC,  LACTICIDVEN Microbiology Recent Results (from the past 240 hour(s))  Urine  culture     Status: Abnormal   Collection Time: 07/23/17  3:15 PM  Result Value Ref Range Status   Specimen Description   Final    URINE, CLEAN CATCH Performed at [facility], [place] [address], [place], [place] [address]    Special Requests   Final    NONE Performed at [facility], [place] [address] [place], [place] [address]    Culture >=100,000 COLONIES/mL KLEBSIELLA PNEUMONIAE (A)  Final   Report Status 07/25/2017 FINAL  Final   Organism ID, Bacteria KLEBSIELLA PNEUMONIAE (A)  Final      Susceptibility   Klebsiella pneumoniae - MIC*    AMPICILLIN >=32 RESISTANT Resistant     CEFAZOLIN <=4 SENSITIVE Sensitive     CEFTRIAXONE <=1 SENSITIVE Sensitive     CIPROFLOXACIN <=0.25 SENSITIVE Sensitive     GENTAMICIN <=1 SENSITIVE Sensitive     IMIPENEM <=0.25 SENSITIVE Sensitive      NITROFURANTOIN <=16 SENSITIVE Sensitive     TRIMETH/SULFA <=20 SENSITIVE Sensitive     AMPICILLIN/SULBACTAM 8 SENSITIVE Sensitive     PIP/TAZO 8 SENSITIVE Sensitive     Extended ESBL NEGATIVE Sensitive     * >=100,000 COLONIES/mL KLEBSIELLA PNEUMONIAE  Respiratory Panel by PCR     Status: None   Collection Time: 07/23/17  3:15 PM  Result Value Ref Range Status   Adenovirus NOT DETECTED NOT DETECTED Final   Coronavirus 229E NOT DETECTED NOT DETECTED Final   Coronavirus HKU1 NOT DETECTED NOT DETECTED Final   Coronavirus NL63 NOT DETECTED NOT DETECTED Final   Coronavirus OC43 NOT DETECTED NOT DETECTED Final   Metapneumovirus NOT DETECTED NOT DETECTED Final   Rhinovirus / Enterovirus NOT DETECTED NOT DETECTED Final   Influenza A NOT DETECTED NOT DETECTED Final   Influenza B NOT DETECTED NOT DETECTED Final   Parainfluenza Virus 1 NOT DETECTED NOT DETECTED Final   Parainfluenza Virus 2 NOT DETECTED NOT DETECTED Final   Parainfluenza Virus 3 NOT DETECTED NOT DETECTED Final   Parainfluenza Virus 4 NOT DETECTED NOT DETECTED Final   Respiratory Syncytial Virus NOT DETECTED NOT DETECTED Final   Bordetella pertussis NOT DETECTED NOT DETECTED Final   Chlamydophila pneumoniae NOT DETECTED NOT DETECTED Final   Mycoplasma pneumoniae NOT DETECTED NOT DETECTED Final    Comment: Performed at [facility], [place]. [address], [place], [place] [address]  Culture, blood (routine x 2)     Status: None   Collection Time: 07/24/17  1:48 PM  Result Value Ref Range Status   Specimen Description   Final    BLOOD RIGHT HAND Performed at [facility], [place] [address], [place], [place] [address]    Special Requests   Final    BOTTLES DRAWN AEROBIC AND ANAEROBIC Blood Culture adequate volume Performed at [facility] [address], [place], [place] [address]    Culture   Final    NO GROWTH 5 DAYS Performed at [facility], [place] [address], [place], [place] [address]     Report Status 07/29/2017 FINAL  Final  Culture, blood (routine x 2)     Status: None   Collection Time: 07/24/17  1:48 PM  Result Value Ref Range Status   Specimen Description   Final    BLOOD RIGHT HAND Performed at [facility], [place] [address], [place], [place] [address]    Special Requests   Final    BOTTLES DRAWN AEROBIC AND ANAEROBIC Blood  Culture adequate volume Performed at [facility] [address], [place], [place] [address]    Culture   Final    NO GROWTH 5 DAYS Performed at [facility], [place] [address], [place], [place] [address]    Report Status 07/29/2017 FINAL  Final   Time coordinating discharge: 35 minutes  SIGNED:  Kerney Elbe, DO [facility] 07/29/2017, 3:48 PM Pager [phone]  If 7PM-7AM, please contact night-coverage [email or web address] Password [email or web address]

## 2017-07-31 DIAGNOSIS — N390 Urinary tract infection, site not specified: Secondary | ICD-10-CM | POA: Diagnosis not present

## 2017-07-31 DIAGNOSIS — R55 Syncope and collapse: Secondary | ICD-10-CM | POA: Diagnosis not present

## 2017-07-31 DIAGNOSIS — G4733 Obstructive sleep apnea (adult) (pediatric): Secondary | ICD-10-CM | POA: Diagnosis not present

## 2017-07-31 DIAGNOSIS — D649 Anemia, unspecified: Secondary | ICD-10-CM | POA: Diagnosis not present

## 2017-07-31 DIAGNOSIS — D72829 Elevated white blood cell count, unspecified: Secondary | ICD-10-CM | POA: Diagnosis not present

## 2017-07-31 DIAGNOSIS — Z6835 Body mass index (BMI) 35.0-35.9, adult: Secondary | ICD-10-CM | POA: Diagnosis not present

## 2017-07-31 DIAGNOSIS — Z794 Long term (current) use of insulin: Secondary | ICD-10-CM | POA: Diagnosis not present

## 2017-07-31 DIAGNOSIS — I10 Essential (primary) hypertension: Secondary | ICD-10-CM | POA: Diagnosis not present

## 2017-07-31 DIAGNOSIS — E1165 Type 2 diabetes mellitus with hyperglycemia: Secondary | ICD-10-CM | POA: Diagnosis not present

## 2017-07-31 DIAGNOSIS — E785 Hyperlipidemia, unspecified: Secondary | ICD-10-CM | POA: Diagnosis not present

## 2017-07-31 DIAGNOSIS — E876 Hypokalemia: Secondary | ICD-10-CM | POA: Diagnosis not present

## 2017-09-16 DIAGNOSIS — Z6835 Body mass index (BMI) 35.0-35.9, adult: Secondary | ICD-10-CM | POA: Diagnosis not present

## 2017-09-16 DIAGNOSIS — E1165 Type 2 diabetes mellitus with hyperglycemia: Secondary | ICD-10-CM | POA: Diagnosis not present

## 2017-09-16 DIAGNOSIS — Z794 Long term (current) use of insulin: Secondary | ICD-10-CM | POA: Diagnosis not present

## 2017-09-16 DIAGNOSIS — I10 Essential (primary) hypertension: Secondary | ICD-10-CM | POA: Diagnosis not present

## 2017-09-23 NOTE — Telephone Encounter (Signed)
error 

## 2017-09-25 DIAGNOSIS — Z125 Encounter for screening for malignant neoplasm of prostate: Secondary | ICD-10-CM | POA: Diagnosis not present

## 2017-09-25 DIAGNOSIS — E7849 Other hyperlipidemia: Secondary | ICD-10-CM | POA: Diagnosis not present

## 2017-09-25 DIAGNOSIS — M859 Disorder of bone density and structure, unspecified: Secondary | ICD-10-CM | POA: Diagnosis not present

## 2017-09-25 DIAGNOSIS — I10 Essential (primary) hypertension: Secondary | ICD-10-CM | POA: Diagnosis not present

## 2017-09-25 DIAGNOSIS — R82998 Other abnormal findings in urine: Secondary | ICD-10-CM | POA: Diagnosis not present

## 2017-09-25 DIAGNOSIS — E1165 Type 2 diabetes mellitus with hyperglycemia: Secondary | ICD-10-CM | POA: Diagnosis not present

## 2017-10-01 DIAGNOSIS — E1129 Type 2 diabetes mellitus with other diabetic kidney complication: Secondary | ICD-10-CM | POA: Diagnosis not present

## 2017-10-01 DIAGNOSIS — Z1389 Encounter for screening for other disorder: Secondary | ICD-10-CM | POA: Diagnosis not present

## 2017-10-01 DIAGNOSIS — Z6836 Body mass index (BMI) 36.0-36.9, adult: Secondary | ICD-10-CM | POA: Diagnosis not present

## 2017-10-01 DIAGNOSIS — E8770 Fluid overload, unspecified: Secondary | ICD-10-CM | POA: Diagnosis not present

## 2017-10-01 DIAGNOSIS — R32 Unspecified urinary incontinence: Secondary | ICD-10-CM | POA: Diagnosis not present

## 2017-10-01 DIAGNOSIS — I872 Venous insufficiency (chronic) (peripheral): Secondary | ICD-10-CM | POA: Diagnosis not present

## 2017-10-01 DIAGNOSIS — Z794 Long term (current) use of insulin: Secondary | ICD-10-CM | POA: Diagnosis not present

## 2017-10-01 DIAGNOSIS — Z0000 Encounter for general adult medical examination without abnormal findings: Secondary | ICD-10-CM | POA: Diagnosis not present

## 2017-10-01 DIAGNOSIS — I10 Essential (primary) hypertension: Secondary | ICD-10-CM | POA: Diagnosis not present

## 2017-10-01 DIAGNOSIS — R600 Localized edema: Secondary | ICD-10-CM | POA: Diagnosis not present

## 2017-10-01 DIAGNOSIS — L408 Other psoriasis: Secondary | ICD-10-CM | POA: Diagnosis not present

## 2017-10-01 DIAGNOSIS — R5381 Other malaise: Secondary | ICD-10-CM | POA: Diagnosis not present

## 2017-10-02 DIAGNOSIS — Z1212 Encounter for screening for malignant neoplasm of rectum: Secondary | ICD-10-CM | POA: Diagnosis not present

## 2017-12-08 DIAGNOSIS — H52223 Regular astigmatism, bilateral: Secondary | ICD-10-CM | POA: Diagnosis not present

## 2017-12-08 DIAGNOSIS — Z7984 Long term (current) use of oral hypoglycemic drugs: Secondary | ICD-10-CM | POA: Diagnosis not present

## 2017-12-08 DIAGNOSIS — E119 Type 2 diabetes mellitus without complications: Secondary | ICD-10-CM | POA: Diagnosis not present

## 2017-12-08 DIAGNOSIS — H5213 Myopia, bilateral: Secondary | ICD-10-CM | POA: Diagnosis not present

## 2017-12-08 DIAGNOSIS — H524 Presbyopia: Secondary | ICD-10-CM | POA: Diagnosis not present

## 2017-12-08 DIAGNOSIS — H02889 Meibomian gland dysfunction of unspecified eye, unspecified eyelid: Secondary | ICD-10-CM | POA: Diagnosis not present

## 2017-12-08 DIAGNOSIS — H43813 Vitreous degeneration, bilateral: Secondary | ICD-10-CM | POA: Diagnosis not present

## 2017-12-08 DIAGNOSIS — H04123 Dry eye syndrome of bilateral lacrimal glands: Secondary | ICD-10-CM | POA: Diagnosis not present

## 2017-12-08 DIAGNOSIS — H2513 Age-related nuclear cataract, bilateral: Secondary | ICD-10-CM | POA: Diagnosis not present

## 2017-12-15 DIAGNOSIS — Z6836 Body mass index (BMI) 36.0-36.9, adult: Secondary | ICD-10-CM | POA: Diagnosis not present

## 2017-12-15 DIAGNOSIS — E1165 Type 2 diabetes mellitus with hyperglycemia: Secondary | ICD-10-CM | POA: Diagnosis not present

## 2017-12-15 DIAGNOSIS — I10 Essential (primary) hypertension: Secondary | ICD-10-CM | POA: Diagnosis not present

## 2017-12-15 DIAGNOSIS — Z794 Long term (current) use of insulin: Secondary | ICD-10-CM | POA: Diagnosis not present

## 2018-01-16 ENCOUNTER — Other Ambulatory Visit: Payer: Self-pay | Admitting: Cardiology

## 2018-01-18 ENCOUNTER — Other Ambulatory Visit: Payer: Self-pay | Admitting: Cardiology

## 2018-01-21 DIAGNOSIS — Z23 Encounter for immunization: Secondary | ICD-10-CM | POA: Diagnosis not present

## 2018-02-01 ENCOUNTER — Other Ambulatory Visit: Payer: Self-pay | Admitting: Cardiology

## 2018-03-22 DIAGNOSIS — R05 Cough: Secondary | ICD-10-CM | POA: Diagnosis not present

## 2018-03-22 DIAGNOSIS — J069 Acute upper respiratory infection, unspecified: Secondary | ICD-10-CM | POA: Diagnosis not present

## 2018-03-22 DIAGNOSIS — Z6837 Body mass index (BMI) 37.0-37.9, adult: Secondary | ICD-10-CM | POA: Diagnosis not present

## 2018-03-30 DIAGNOSIS — I10 Essential (primary) hypertension: Secondary | ICD-10-CM | POA: Diagnosis not present

## 2018-03-30 DIAGNOSIS — E1165 Type 2 diabetes mellitus with hyperglycemia: Secondary | ICD-10-CM | POA: Diagnosis not present

## 2018-03-30 DIAGNOSIS — Z794 Long term (current) use of insulin: Secondary | ICD-10-CM | POA: Diagnosis not present

## 2018-05-13 ENCOUNTER — Other Ambulatory Visit: Payer: Self-pay

## 2018-06-29 DIAGNOSIS — E1165 Type 2 diabetes mellitus with hyperglycemia: Secondary | ICD-10-CM | POA: Diagnosis not present

## 2018-06-29 DIAGNOSIS — I10 Essential (primary) hypertension: Secondary | ICD-10-CM | POA: Diagnosis not present

## 2018-06-29 DIAGNOSIS — Z794 Long term (current) use of insulin: Secondary | ICD-10-CM | POA: Diagnosis not present

## 2018-09-29 DIAGNOSIS — Z794 Long term (current) use of insulin: Secondary | ICD-10-CM | POA: Diagnosis not present

## 2018-09-29 DIAGNOSIS — E1165 Type 2 diabetes mellitus with hyperglycemia: Secondary | ICD-10-CM | POA: Diagnosis not present

## 2018-09-29 DIAGNOSIS — I10 Essential (primary) hypertension: Secondary | ICD-10-CM | POA: Diagnosis not present

## 2018-10-22 DIAGNOSIS — Z125 Encounter for screening for malignant neoplasm of prostate: Secondary | ICD-10-CM | POA: Diagnosis not present

## 2018-10-22 DIAGNOSIS — E7849 Other hyperlipidemia: Secondary | ICD-10-CM | POA: Diagnosis not present

## 2018-10-22 DIAGNOSIS — M859 Disorder of bone density and structure, unspecified: Secondary | ICD-10-CM | POA: Diagnosis not present

## 2018-10-22 DIAGNOSIS — I10 Essential (primary) hypertension: Secondary | ICD-10-CM | POA: Diagnosis not present

## 2018-10-22 DIAGNOSIS — E1165 Type 2 diabetes mellitus with hyperglycemia: Secondary | ICD-10-CM | POA: Diagnosis not present

## 2018-10-25 DIAGNOSIS — R82998 Other abnormal findings in urine: Secondary | ICD-10-CM | POA: Diagnosis not present

## 2018-10-25 DIAGNOSIS — E1165 Type 2 diabetes mellitus with hyperglycemia: Secondary | ICD-10-CM | POA: Diagnosis not present

## 2018-10-28 DIAGNOSIS — I272 Pulmonary hypertension, unspecified: Secondary | ICD-10-CM | POA: Diagnosis not present

## 2018-10-28 DIAGNOSIS — Z0000 Encounter for general adult medical examination without abnormal findings: Secondary | ICD-10-CM | POA: Diagnosis not present

## 2018-10-28 DIAGNOSIS — R809 Proteinuria, unspecified: Secondary | ICD-10-CM | POA: Diagnosis not present

## 2018-10-28 DIAGNOSIS — Z1331 Encounter for screening for depression: Secondary | ICD-10-CM | POA: Diagnosis not present

## 2018-10-28 DIAGNOSIS — I10 Essential (primary) hypertension: Secondary | ICD-10-CM | POA: Diagnosis not present

## 2018-10-28 DIAGNOSIS — I872 Venous insufficiency (chronic) (peripheral): Secondary | ICD-10-CM | POA: Diagnosis not present

## 2018-10-28 DIAGNOSIS — D649 Anemia, unspecified: Secondary | ICD-10-CM | POA: Diagnosis not present

## 2018-10-28 DIAGNOSIS — E876 Hypokalemia: Secondary | ICD-10-CM | POA: Diagnosis not present

## 2018-10-28 DIAGNOSIS — I700 Atherosclerosis of aorta: Secondary | ICD-10-CM | POA: Diagnosis not present

## 2018-10-28 DIAGNOSIS — Z794 Long term (current) use of insulin: Secondary | ICD-10-CM | POA: Diagnosis not present

## 2018-10-28 DIAGNOSIS — R609 Edema, unspecified: Secondary | ICD-10-CM | POA: Diagnosis not present

## 2018-10-28 DIAGNOSIS — E1129 Type 2 diabetes mellitus with other diabetic kidney complication: Secondary | ICD-10-CM | POA: Diagnosis not present

## 2018-10-28 DIAGNOSIS — I6529 Occlusion and stenosis of unspecified carotid artery: Secondary | ICD-10-CM | POA: Diagnosis not present

## 2019-01-04 DIAGNOSIS — E1165 Type 2 diabetes mellitus with hyperglycemia: Secondary | ICD-10-CM | POA: Diagnosis not present

## 2019-01-04 DIAGNOSIS — Z794 Long term (current) use of insulin: Secondary | ICD-10-CM | POA: Diagnosis not present

## 2019-01-04 DIAGNOSIS — I10 Essential (primary) hypertension: Secondary | ICD-10-CM | POA: Diagnosis not present

## 2019-01-28 DIAGNOSIS — Z23 Encounter for immunization: Secondary | ICD-10-CM | POA: Diagnosis not present

## 2019-02-01 ENCOUNTER — Encounter: Payer: Self-pay | Admitting: Internal Medicine

## 2019-02-08 DIAGNOSIS — M8589 Other specified disorders of bone density and structure, multiple sites: Secondary | ICD-10-CM | POA: Diagnosis not present

## 2019-02-18 DIAGNOSIS — D2261 Melanocytic nevi of right upper limb, including shoulder: Secondary | ICD-10-CM | POA: Diagnosis not present

## 2019-02-18 DIAGNOSIS — L820 Inflamed seborrheic keratosis: Secondary | ICD-10-CM | POA: Diagnosis not present

## 2019-02-18 DIAGNOSIS — D225 Melanocytic nevi of trunk: Secondary | ICD-10-CM | POA: Diagnosis not present

## 2019-02-18 DIAGNOSIS — L821 Other seborrheic keratosis: Secondary | ICD-10-CM | POA: Diagnosis not present

## 2019-02-18 DIAGNOSIS — L570 Actinic keratosis: Secondary | ICD-10-CM | POA: Diagnosis not present

## 2019-02-18 DIAGNOSIS — D2262 Melanocytic nevi of left upper limb, including shoulder: Secondary | ICD-10-CM | POA: Diagnosis not present

## 2019-04-14 DIAGNOSIS — I10 Essential (primary) hypertension: Secondary | ICD-10-CM | POA: Diagnosis not present

## 2019-04-14 DIAGNOSIS — Z794 Long term (current) use of insulin: Secondary | ICD-10-CM | POA: Diagnosis not present

## 2019-04-14 DIAGNOSIS — E1165 Type 2 diabetes mellitus with hyperglycemia: Secondary | ICD-10-CM | POA: Diagnosis not present

## 2019-05-31 ENCOUNTER — Ambulatory Visit: Payer: Medicare Other | Attending: Internal Medicine

## 2019-05-31 DIAGNOSIS — Z23 Encounter for immunization: Secondary | ICD-10-CM | POA: Insufficient documentation

## 2019-05-31 NOTE — Progress Notes (Signed)
   Covid-19 Vaccination Clinic  Name:  Kenneth [name]    MRN: [id number] DOB: April 14, 1951  05/31/2019  Kenneth [name] was observed post Covid-19 immunization for 15 minutes without incidence. He was provided with Vaccine Information Sheet and instruction to access the V-Safe system.   Kenneth [name] was instructed to call 911 with any severe reactions post vaccine: Marland Kitchen Difficulty breathing  . Swelling of your face and throat  . A fast heartbeat  . A bad rash all over your body  . Dizziness and weakness    Immunizations Administered    Name Date Dose VIS Date Route   Pfizer COVID-19 Vaccine 05/31/2019  2:38 PM 0.3 mL 04/22/2019 Intramuscular   Manufacturer: [place]   Lot: [id number]   [place]: [id number]

## 2019-06-20 ENCOUNTER — Ambulatory Visit: Payer: Medicare Other | Attending: Internal Medicine

## 2019-06-20 DIAGNOSIS — Z23 Encounter for immunization: Secondary | ICD-10-CM

## 2019-06-20 NOTE — Progress Notes (Signed)
   Covid-19 Vaccination Clinic  Name:  CLEMMON BROOKE    MRN: [id number] DOB: Jan 11, 1951  06/20/2019  Mr. Queer was observed post Covid-19 immunization for 15 minutes without incidence. He was provided with Vaccine Information Sheet and instruction to access the V-Safe system.   Mr. Diel was instructed to call 911 with any severe reactions post vaccine: Marland Kitchen Difficulty breathing  . Swelling of your face and throat  . A fast heartbeat  . A bad rash all over your body  . Dizziness and weakness    Immunizations Administered    Name Date Dose VIS Date Route   Pfizer COVID-19 Vaccine 06/20/2019  3:27 PM 0.3 mL 04/22/2019 Intramuscular   Manufacturer: [place]   Lot: [id number]   [place]: [id number]

## 2019-06-29 ENCOUNTER — Ambulatory Visit (INDEPENDENT_AMBULATORY_CARE_PROVIDER_SITE_OTHER): Payer: Medicare Other | Admitting: Cardiology

## 2019-06-29 ENCOUNTER — Encounter: Payer: Self-pay | Admitting: Cardiology

## 2019-06-29 ENCOUNTER — Other Ambulatory Visit: Payer: Self-pay

## 2019-06-29 VITALS — BP 126/60 | HR 54 | Ht 70.0 in | Wt 247.0 lb

## 2019-06-29 DIAGNOSIS — I10 Essential (primary) hypertension: Secondary | ICD-10-CM | POA: Diagnosis not present

## 2019-06-29 DIAGNOSIS — E7800 Pure hypercholesterolemia, unspecified: Secondary | ICD-10-CM

## 2019-06-29 DIAGNOSIS — E119 Type 2 diabetes mellitus without complications: Secondary | ICD-10-CM

## 2019-06-29 DIAGNOSIS — E669 Obesity, unspecified: Secondary | ICD-10-CM | POA: Insufficient documentation

## 2019-06-29 NOTE — Progress Notes (Signed)
Cardiology Office Note:    Date:  06/29/2019   ID:  Kenneth [name], DOB May 04, 1951, MRN [id number]  PCP:  Crist Infante, MD  Cardiologist:  Candee Furbish, MD  Electrophysiologist:  None   Referring MD: Crist Infante, MD     History of Present Illness:    Kenneth [name] is a 69 y.o. male with diabetes hypertension here for follow-up.  Last visit in our clinic was October 2018.  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  Doing well.  Prior echocardiogram 07/24/17: - Left ventricle: The cavity size was normal. There was mild  concentric hypertrophy. Systolic function was normal. The  estimated ejection fraction was in the range of 60% to 65%. Wall  motion was normal; there were no regional wall motion  abnormalities. Left ventricular diastolic function parameters  were normal.  - Aortic valve: Valve area (Vmax): 1.99 cm^2.  - Right ventricle: The cavity size was mildly dilated. Wall  thickness was normal.  - Pulmonary arteries: Systolic pressure was moderately increased.  PA peak pressure: 43 mm Hg (S).   Impressions:   - There is no significant change since the prior study on  02/13/2017.   Past Medical History:  Diagnosis Date  . Broken neck ([place])   . Diabetes mellitus   . 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 History of colon polyps - adenomas 04/03/2010  . Hyperlipidemia   . Hypertension   . OSA (obstructive sleep apnea)    refusing CPAP    Past Surgical History:  Procedure Laterality Date  . ANTERIOR CERVICAL DECOMP/DISCECTOMY FUSION N/A 10/01/2012   Procedure: ANTERIOR CERVICAL DECOMPRESSION/DISCECTOMY FUSION 2 LEVELS;  Surgeon: Eustace Moore, MD;  Location: [facility];  Service: Neurosurgery;  Laterality: N/A;  Cervical five-six,Cervical six-seven  . COLONOSCOPY  09/30/2010   diverticulosis, small internal hemorrhoids  . COLONOSCOPY W/ POLYPECTOMY  04/03/2010   4 polyps 8-28mm, worst TV adenoma with high-grade dysplasia  . NO PAST  SURGERIES    . POSTERIOR CERVICAL FUSION/FORAMINOTOMY Left 10/20/2012   Procedure: C/5-6,C/6-7 [place]/Multi level,POSTERIOR CERVICAL FUSION/FORAMINOTOMY C/4-7;  Surgeon: Eustace Moore, MD;  Location: [facility];  Service: Neurosurgery;  Laterality: Left;  Cervical Five-Six, Cervical Six-Seven Laminectomies, Posterior Cervical Fusion/Foraminotomies Cervical Four through Seven.   . TONSILLECTOMY      Current Medications: Current Meds  Medication Sig  . acetaminophen (TYLENOL) 500 MG tablet Take 500 mg by mouth daily as needed for headache.  Marland Kitchen aspirin 81 MG tablet Take 81 mg by mouth daily.    Marland Kitchen atorvastatin (LIPITOR) 40 MG tablet Take 40 mg by mouth daily.  . B-D UF III MINI PEN NEEDLES 31G X 5 MM MISC Inject 1 Stick as directed as directed.  . benazepril (LOTENSIN) 40 MG tablet Take 1 tablet (40 mg total) by mouth daily. Please schedule appt for future refills. 1st attempt  . calcium carbonate (OS-CAL) 600 MG TABS Take 600 mg by mouth 2 (two) times daily with a meal.    . chlorthalidone (HYGROTON) 25 MG tablet Take 25 mg by mouth daily.  . diphenhydramine-acetaminophen (TYLENOL PM) 25-500 MG TABS tablet Take 1 tablet by mouth at bedtime as needed (SLEEP).  . Insulin Glargine (TOUJEO MAX SOLOSTAR) 300 UNIT/ML SOPN Inject 60 Units into the skin every morning.   . metFORMIN (GLUCOPHAGE) 1000 MG tablet Take 1,000 mg by mouth 2 (two) times daily with a meal.    . NOVOLOG FLEXPEN 100 UNIT/ML FlexPen  Inject 16 Units as directed 3 (three) times daily.  . tamsulosin (FLOMAX) 0.4 MG CAPS capsule Take 0.4 mg by mouth daily.     Allergies:   Penicillins   Social History   Socioeconomic History  . Marital status: Married    Spouse name: Not on file  . Number of children: Not on file  . Years of education: Not on file  . Highest education level: Not on file  Occupational History  . Not on file  Tobacco Use  . Smoking status: Former Smoker    Years: 2.00    Quit date: 09/22/1969    Years since  quitting: 49.8  . Smokeless tobacco: Never Used  Substance and Sexual Activity  . Alcohol use: No  . Drug use: No  . Sexual activity: Never  Other Topics Concern  . Not on file  Social History Narrative  . Not on file   Social Determinants of Health   Financial Resource Strain:   . Difficulty of Paying Living Expenses: Not on file  Food Insecurity:   . Worried About Charity fundraiser in the Last Year: Not on file  . Ran Out of Food in the Last Year: Not on file  Transportation Needs:   . Lack of Transportation (Medical): Not on file  . Lack of Transportation (Non-Medical): Not on file  Physical Activity:   . Days of Exercise per Week: Not on file  . Minutes of Exercise per Session: Not on file  Stress:   . Feeling of Stress : Not on file  Social Connections:   . Frequency of Communication with Friends and Family: Not on file  . Frequency of Social Gatherings with Friends and Family: Not on file  . Attends Religious Services: Not on file  . Active Member of Clubs or Organizations: Not on file  . Attends Archivist Meetings: Not on file  . Marital Status: Not on file     Family History: The patient's family history includes Diabetes in his mother. There is no history of Stomach cancer, Rectal cancer, Pancreatic cancer, or Colon cancer.  ROS:   Please see the history of present illness.     All other systems reviewed and are negative.  EKGs/Labs/Other Studies Reviewed:    The following studies were reviewed today: Echocardiogram as above  EKG:  EKG is  ordered today.  The ekg ordered today demonstrates 06/29/2019-sinus bradycardia 54 with nonspecific T wave flattening  Recent Labs: No results found for requested labs within last 8760 hours.  Recent Lipid Panel    Component Value Date/Time   CHOL 132 07/25/2017 0442   CHOL 159 02/10/2017 1307   TRIG 102 07/25/2017 0442   HDL 37 (L) 07/25/2017 0442   HDL 67 02/10/2017 1307   CHOLHDL 3.6 07/25/2017 0442    VLDL 20 07/25/2017 0442   LDLCALC 75 07/25/2017 0442   LDLCALC 58 02/10/2017 1307    Physical Exam:    VS:  BP 126/60   Pulse (!) 54   Ht 5\' 10"  (1.778 m)   Wt 247 lb (112 kg)   SpO2 96%   BMI 35.44 kg/m     Wt Readings from Last 3 Encounters:  06/29/19 247 lb (112 kg)  07/25/17 236 lb 8.9 oz (107.3 kg)  02/19/17 251 lb 12.8 oz (114.2 kg)     GEN: Overweight, Well nourished, well developed in no acute distress HEENT: Normal NECK: No JVD; No carotid bruits LYMPHATICS: No lymphadenopathy CARDIAC: RRR,  no murmurs, rubs, gallops RESPIRATORY:  Clear to auscultation without rales, wheezing or rhonchi  ABDOMEN: Soft, non-tender, non-distended MUSCULOSKELETAL:  No edema; No deformity  SKIN: Warm and dry NEUROLOGIC:  Alert and oriented x 3 PSYCHIATRIC:  Normal affect   ASSESSMENT:    1. Pure hypercholesterolemia   2. Essential (primary) hypertension   3. Diabetes mellitus with coincident hypertension ([place])   4. Morbid obesity ([place])    PLAN:    In order of problems listed above:  Hypertension, essential with diabetes -Under excellent control especially during a stressful tax season.  No changes made.  Dr. Joylene Draft.  Last hemoglobin A1c 6.9, creatinine 0.8, hemoglobin 13.5, ALT 13  Morbid obesity -BMI greater than 35 with 2 or more comorbidities. -Continue to encourage weight loss, carbohydrate reduction.  Exercise.  Hyperlipidemia -On atorvastatin 40 mg, LDL 68, no myalgias.  He has been on for years.  Excellent.   Medication Adjustments/Labs and Tests Ordered: Current medicines are reviewed at length with the patient today.  Concerns regarding medicines are outlined above.  Orders Placed This Encounter  Procedures  . EKG 12-Lead   No orders of the defined types were placed in this encounter.   Patient Instructions  Medication Instructions:  The current medical regimen is effective;  continue present plan and medications.  *If you need a refill on your  cardiac medications before your next appointment, please call your pharmacy*  Follow-Up: At [facility], you and your health needs are our priority.  As part of our continuing mission to provide you with exceptional heart care, we have created designated Provider Care Teams.  These Care Teams include your primary Cardiologist (physician) and Advanced Practice Providers (APPs -  Physician Assistants and Nurse Practitioners) who all work together to provide you with the care you need, when you need it.  Your next appointment:   2 year(s)  The format for your next appointment:   In Person  Provider:   Candee Furbish, MD  Thank you for choosing [facility]!!         Signed, Candee Furbish, MD  06/29/2019 12:17 PM    [place]

## 2019-06-29 NOTE — Patient Instructions (Signed)
Medication Instructions:  The current medical regimen is effective;  continue present plan and medications.  *If you need a refill on your cardiac medications before your next appointment, please call your pharmacy*  Follow-Up: At [facility], you and your health needs are our priority.  As part of our continuing mission to provide you with exceptional heart care, we have created designated Provider Care Teams.  These Care Teams include your primary Cardiologist (physician) and Advanced Practice Providers (APPs -  Physician Assistants and Nurse Practitioners) who all work together to provide you with the care you need, when you need it.  Your next appointment:   2 year(s)  The format for your next appointment:   In Person  Provider:   Candee Furbish, MD  Thank you for choosing [facility]!!

## 2019-07-19 DIAGNOSIS — E1165 Type 2 diabetes mellitus with hyperglycemia: Secondary | ICD-10-CM | POA: Diagnosis not present

## 2019-07-19 DIAGNOSIS — I10 Essential (primary) hypertension: Secondary | ICD-10-CM | POA: Diagnosis not present

## 2019-07-19 DIAGNOSIS — Z794 Long term (current) use of insulin: Secondary | ICD-10-CM | POA: Diagnosis not present

## 2019-10-18 DIAGNOSIS — Z794 Long term (current) use of insulin: Secondary | ICD-10-CM | POA: Diagnosis not present

## 2019-10-18 DIAGNOSIS — I10 Essential (primary) hypertension: Secondary | ICD-10-CM | POA: Diagnosis not present

## 2019-10-18 DIAGNOSIS — E1165 Type 2 diabetes mellitus with hyperglycemia: Secondary | ICD-10-CM | POA: Diagnosis not present

## 2019-10-20 DIAGNOSIS — E119 Type 2 diabetes mellitus without complications: Secondary | ICD-10-CM | POA: Diagnosis not present

## 2019-12-02 DIAGNOSIS — E7849 Other hyperlipidemia: Secondary | ICD-10-CM | POA: Diagnosis not present

## 2019-12-02 DIAGNOSIS — Z125 Encounter for screening for malignant neoplasm of prostate: Secondary | ICD-10-CM | POA: Diagnosis not present

## 2019-12-02 DIAGNOSIS — E1165 Type 2 diabetes mellitus with hyperglycemia: Secondary | ICD-10-CM | POA: Diagnosis not present

## 2019-12-16 DIAGNOSIS — H168 Other keratitis: Secondary | ICD-10-CM | POA: Diagnosis not present

## 2019-12-19 ENCOUNTER — Other Ambulatory Visit: Payer: Self-pay | Admitting: Internal Medicine

## 2019-12-19 ENCOUNTER — Encounter: Payer: Self-pay | Admitting: Internal Medicine

## 2019-12-19 DIAGNOSIS — Z1331 Encounter for screening for depression: Secondary | ICD-10-CM | POA: Diagnosis not present

## 2019-12-19 DIAGNOSIS — I272 Pulmonary hypertension, unspecified: Secondary | ICD-10-CM | POA: Diagnosis not present

## 2019-12-19 DIAGNOSIS — I10 Essential (primary) hypertension: Secondary | ICD-10-CM | POA: Diagnosis not present

## 2019-12-19 DIAGNOSIS — R82998 Other abnormal findings in urine: Secondary | ICD-10-CM | POA: Diagnosis not present

## 2019-12-19 DIAGNOSIS — I700 Atherosclerosis of aorta: Secondary | ICD-10-CM | POA: Diagnosis not present

## 2019-12-19 DIAGNOSIS — R809 Proteinuria, unspecified: Secondary | ICD-10-CM | POA: Diagnosis not present

## 2019-12-19 DIAGNOSIS — E669 Obesity, unspecified: Secondary | ICD-10-CM | POA: Diagnosis not present

## 2019-12-19 DIAGNOSIS — R972 Elevated prostate specific antigen [PSA]: Secondary | ICD-10-CM | POA: Diagnosis not present

## 2019-12-19 DIAGNOSIS — E785 Hyperlipidemia, unspecified: Secondary | ICD-10-CM

## 2019-12-19 DIAGNOSIS — Z0000 Encounter for general adult medical examination without abnormal findings: Secondary | ICD-10-CM | POA: Diagnosis not present

## 2019-12-19 DIAGNOSIS — H168 Other keratitis: Secondary | ICD-10-CM | POA: Diagnosis not present

## 2019-12-19 DIAGNOSIS — E1129 Type 2 diabetes mellitus with other diabetic kidney complication: Secondary | ICD-10-CM | POA: Diagnosis not present

## 2019-12-19 DIAGNOSIS — Z794 Long term (current) use of insulin: Secondary | ICD-10-CM | POA: Diagnosis not present

## 2019-12-19 DIAGNOSIS — E1165 Type 2 diabetes mellitus with hyperglycemia: Secondary | ICD-10-CM | POA: Diagnosis not present

## 2020-01-12 ENCOUNTER — Ambulatory Visit
Admission: RE | Admit: 2020-01-12 | Discharge: 2020-01-12 | Disposition: A | Discharge status: Home / Self Care | Payer: No Typology Code available for payment source | Source: Ambulatory Visit | Attending: Internal Medicine | Admitting: Internal Medicine

## 2020-01-12 DIAGNOSIS — E785 Hyperlipidemia, unspecified: Secondary | ICD-10-CM

## 2020-01-17 ENCOUNTER — Other Ambulatory Visit: Payer: Self-pay | Admitting: Internal Medicine

## 2020-01-17 DIAGNOSIS — R911 Solitary pulmonary nodule: Secondary | ICD-10-CM

## 2020-01-18 DIAGNOSIS — Z794 Long term (current) use of insulin: Secondary | ICD-10-CM | POA: Diagnosis not present

## 2020-01-18 DIAGNOSIS — E785 Hyperlipidemia, unspecified: Secondary | ICD-10-CM | POA: Diagnosis not present

## 2020-01-18 DIAGNOSIS — I10 Essential (primary) hypertension: Secondary | ICD-10-CM | POA: Diagnosis not present

## 2020-01-18 DIAGNOSIS — E1129 Type 2 diabetes mellitus with other diabetic kidney complication: Secondary | ICD-10-CM | POA: Diagnosis not present

## 2020-02-01 DIAGNOSIS — R972 Elevated prostate specific antigen [PSA]: Secondary | ICD-10-CM | POA: Diagnosis not present

## 2020-02-01 DIAGNOSIS — N403 Nodular prostate with lower urinary tract symptoms: Secondary | ICD-10-CM | POA: Diagnosis not present

## 2020-02-01 DIAGNOSIS — R3912 Poor urinary stream: Secondary | ICD-10-CM | POA: Diagnosis not present

## 2020-02-09 DIAGNOSIS — Z23 Encounter for immunization: Secondary | ICD-10-CM | POA: Diagnosis not present

## 2020-02-13 ENCOUNTER — Ambulatory Visit (AMBULATORY_SURGERY_CENTER): Payer: Self-pay | Admitting: *Deleted

## 2020-02-13 ENCOUNTER — Other Ambulatory Visit: Payer: Self-pay

## 2020-02-13 VITALS — Ht 70.0 in | Wt 241.0 lb

## 2020-02-13 DIAGNOSIS — Z86010 Personal history of colonic polyps: Secondary | ICD-10-CM

## 2020-02-13 NOTE — Progress Notes (Signed)
Patient is here in-person for PV. Patient denies any allergies to eggs or soy. Patient denies any problems with anesthesia/sedation. Patient denies any oxygen use at home. Patient denies taking any diet/weight loss medications or blood thinners. Patient is not being treated for MRSA or C-diff. Patient is aware of our care-partner policy and [facility] safety protocol.   COVID-19 vaccines completed on 02/09/2020 3 rd shot, per patient.

## 2020-02-26 ENCOUNTER — Encounter: Payer: Self-pay | Admitting: Certified Registered Nurse Anesthetist

## 2020-02-27 ENCOUNTER — Ambulatory Visit (AMBULATORY_SURGERY_CENTER): Payer: Medicare Other | Admitting: Gastroenterology

## 2020-02-27 ENCOUNTER — Other Ambulatory Visit: Payer: Self-pay

## 2020-02-27 ENCOUNTER — Encounter: Payer: Medicare Other | Admitting: Internal Medicine

## 2020-02-27 ENCOUNTER — Encounter: Payer: Self-pay | Admitting: Gastroenterology

## 2020-02-27 VITALS — BP 145/76 | HR 56 | Temp 97.2°F | Resp 19 | Ht 70.0 in | Wt 241.0 lb

## 2020-02-27 DIAGNOSIS — E119 Type 2 diabetes mellitus without complications: Secondary | ICD-10-CM | POA: Diagnosis not present

## 2020-02-27 DIAGNOSIS — K5730 Diverticulosis of large intestine without perforation or abscess without bleeding: Secondary | ICD-10-CM

## 2020-02-27 DIAGNOSIS — D120 Benign neoplasm of cecum: Secondary | ICD-10-CM | POA: Diagnosis not present

## 2020-02-27 DIAGNOSIS — K621 Rectal polyp: Secondary | ICD-10-CM

## 2020-02-27 DIAGNOSIS — Z860100 Personal history of colon polyps, unspecified: Secondary | ICD-10-CM

## 2020-02-27 DIAGNOSIS — G4733 Obstructive sleep apnea (adult) (pediatric): Secondary | ICD-10-CM | POA: Diagnosis not present

## 2020-02-27 DIAGNOSIS — D122 Benign neoplasm of ascending colon: Secondary | ICD-10-CM

## 2020-02-27 DIAGNOSIS — D129 Benign neoplasm of anus and anal canal: Secondary | ICD-10-CM

## 2020-02-27 DIAGNOSIS — D125 Benign neoplasm of sigmoid colon: Secondary | ICD-10-CM | POA: Diagnosis not present

## 2020-02-27 DIAGNOSIS — I10 Essential (primary) hypertension: Secondary | ICD-10-CM | POA: Diagnosis not present

## 2020-02-27 DIAGNOSIS — D128 Benign neoplasm of rectum: Secondary | ICD-10-CM

## 2020-02-27 DIAGNOSIS — K640 First degree hemorrhoids: Secondary | ICD-10-CM

## 2020-02-27 MED ORDER — SODIUM CHLORIDE 0.9 % IV SOLN: 500.0000 mL | Freq: Once | INTRAVENOUS | Status: DC

## 2020-02-27 NOTE — Patient Instructions (Signed)
Handouts given for polyps, diverticulosis and hemorrhoids.  Await pathology results.  YOU HAD AN ENDOSCOPIC PROCEDURE TODAY AT THE  ENDOSCOPY CENTER:   Refer to the procedure report that was given to you for any specific questions about what was found during the examination.  If the procedure report does not answer your questions, please call your gastroenterologist to clarify.  If you requested that your care partner not be given the details of your procedure findings, then the procedure report has been included in a sealed envelope for you to review at your convenience later.  YOU SHOULD EXPECT: Some feelings of bloating in the abdomen. Passage of more gas than usual.  Walking can help get rid of the air that was put into your GI tract during the procedure and reduce the bloating. If you had a lower endoscopy (such as a colonoscopy or flexible sigmoidoscopy) you may notice spotting of blood in your stool or on the toilet paper. If you underwent a bowel prep for your procedure, you may not have a normal bowel movement for a few days.  Please Note:  You might notice some irritation and congestion in your nose or some drainage.  This is from the oxygen used during your procedure.  There is no need for concern and it should clear up in a day or so.  SYMPTOMS TO REPORT IMMEDIATELY:   Following lower endoscopy (colonoscopy or flexible sigmoidoscopy):  Excessive amounts of blood in the stool  Significant tenderness or worsening of abdominal pains  Swelling of the abdomen that is new, acute  Fever of 100F or higher   For urgent or emergent issues, a gastroenterologist can be reached at any hour by calling [phone]. Do not use MyChart messaging for urgent concerns.    DIET:  We do recommend a small meal at first, but then you may proceed to your regular diet.  Drink plenty of fluids but you should avoid alcoholic beverages for 24 hours.  ACTIVITY:  You should plan to take it easy for  the rest of today and you should NOT DRIVE or use heavy machinery until tomorrow (because of the sedation medicines used during the test).    FOLLOW UP: Our staff will call the number listed on your records 48-72 hours following your procedure to check on you and address any questions or concerns that you may have regarding the information given to you following your procedure. If we do not reach you, we will leave a message.  We will attempt to reach you two times.  During this call, we will ask if you have developed any symptoms of COVID 19. If you develop any symptoms (ie: fever, flu-like symptoms, shortness of breath, cough etc.) before then, please call [phone].  If you test positive for Covid 19 in the 2 weeks post procedure, please call and report this information to us.    If any biopsies were taken you will be contacted by phone or by letter within the next 1-3 weeks.  Please call us at [phone] if you have not heard about the biopsies in 3 weeks.    SIGNATURES/CONFIDENTIALITY: You and/or your care partner have signed paperwork which will be entered into your electronic medical record.  These signatures attest to the fact that that the information above on your After Visit Summary has been reviewed and is understood.  Full responsibility of the confidentiality of this discharge information lies with you and/or your care-partner. 

## 2020-02-27 NOTE — Progress Notes (Signed)
Report given to PACU, vss 

## 2020-02-27 NOTE — Progress Notes (Signed)
Pt's states no medical or surgical changes since previsit or office visit.  CW - vitals 

## 2020-02-27 NOTE — Op Note (Addendum)
[facility] Patient Name: Kenneth [name] Procedure Date: 02/27/2020 8:51 AM MRN: [id number] Endoscopist: Gerrit Heck , MD Age: 69 Referring MD:  Date of Birth: 09-13-1950 Gender: Male Account #: [id number] Procedure:                Colonoscopy Indications:              Surveillance: Personal history of adenomatous                            polyps on last colonoscopy > 5 years ago                           Last colonoscopy was 02/2014 and notable 2 mm                            tubular adenoma and 2 mm sessile serrated polyp.                            Prior to that, colonoscopy in 2011 with 4 tubular                            adenomas, including 1 measuring 25 mm. Medicines:                Monitored Anesthesia Care Procedure:                Pre-Anesthesia Assessment:                           - Prior to the procedure, a History and Physical                            was performed, and patient medications and                            allergies were reviewed. The patient's tolerance of                            previous anesthesia was also reviewed. The risks                            and benefits of the procedure and the sedation                            options and risks were discussed with the patient.                            All questions were answered, and informed consent                            was obtained. Prior Anticoagulants: The patient has                            taken no previous anticoagulant or antiplatelet  agents. ASA Grade Assessment: III - A patient with                            severe systemic disease. After reviewing the risks                            and benefits, the patient was deemed in                            satisfactory condition to undergo the procedure.                           After obtaining informed consent, the colonoscope                            was passed under direct vision.  Throughout the                            procedure, the patient's blood pressure, pulse, and                            oxygen saturations were monitored continuously. The                            Colonoscope was introduced through the anus and                            advanced to the the terminal ileum. The colonoscopy                            was performed without difficulty. The patient                            tolerated the procedure well. The quality of the                            bowel preparation was good. The terminal ileum,                            ileocecal valve, appendiceal orifice, and rectum                            were photographed. Scope In: 8:55:39 AM Scope Out: 9:17:28 AM Scope Withdrawal Time: 0 hours 19 minutes 52 seconds  Total Procedure Duration: 0 hours 21 minutes 49 seconds  Findings:                 The perianal and digital rectal examinations were                            normal.                           A 2 mm polyp was found in the cecum. The polyp was  sessile. The polyp was removed with a cold snare.                            Resection and retrieval were complete. Estimated                            blood loss was minimal.                           Three sessile polyps were found in the sigmoid                            colon (2) and ascending colon. The polyps were 3 to                            6 mm in size. These polyps were removed with a cold                            snare. Resection and retrieval were complete.                            Estimated blood loss was minimal.                           Two sessile polyps were found in the rectum. The                            polyps were 2 to 3 mm in size. These polyps were                            removed with a cold snare. Resection and retrieval                            were complete. Estimated blood loss was minimal.                            Multiple small-mouthed diverticula were found in                            the sigmoid colon and descending colon.                           Non-bleeding internal hemorrhoids were found during                            retroflexion. The hemorrhoids were small and Grade                            I (internal hemorrhoids that do not prolapse).                           The terminal ileum appeared normal. Complications:            No immediate  complications. Estimated Blood Loss:     Estimated blood loss was minimal. Impression:               - One 2 mm polyp in the cecum, removed with a cold                            snare. Resected and retrieved.                           - Three 3 to 6 mm polyps in the sigmoid colon and                            in the ascending colon, removed with a cold snare.                            Resected and retrieved.                           - Two 2 to 3 mm polyps in the rectum, removed with                            a cold snare. Resected and retrieved.                           - Diverticulosis in the sigmoid colon and in the                            descending colon.                           - Non-bleeding internal hemorrhoids.                           - The examined portion of the ileum was normal. Recommendation:           - Patient has a contact number available for                            emergencies. The signs and symptoms of potential                            delayed complications were discussed with the                            patient. Return to normal activities tomorrow.                            Written discharge instructions were provided to the                            patient.                           - Resume previous diet.                           -  Continue present medications.                           - Await pathology results.                           - Repeat colonoscopy in 3 - 5 years for                             surveillance based on pathology results.                           - Return to GI office PRN. Gerrit Heck, MD 02/27/2020 9:24:56 AM

## 2020-02-27 NOTE — Progress Notes (Signed)
Called to room to assist during endoscopic procedure.  Patient ID and intended procedure confirmed with present staff. Received instructions for my participation in the procedure from the performing physician.  

## 2020-02-29 ENCOUNTER — Telehealth: Payer: Self-pay | Admitting: *Deleted

## 2020-02-29 ENCOUNTER — Telehealth: Payer: Self-pay

## 2020-02-29 NOTE — Telephone Encounter (Signed)
°  Follow up Call-  Call back number 02/27/2020  Post procedure Call Back phone  # [phone]  Permission to leave phone message Yes  Some recent data might be hidden     Patient questions:  Do you have a fever, pain , or abdominal swelling? No. Pain Score  0 *  Have you tolerated food without any problems? Yes.    Have you been able to return to your normal activities? Yes.    Do you have any questions about your discharge instructions: Diet   No. Medications  No. Follow up visit  No.  Do you have questions or concerns about your Care? No.  Actions: * If pain score is 4 or above: 1. No action needed, pain <4.Have you developed a fever since your procedure? no  2.   Have you had an respiratory symptoms (SOB or cough) since your procedure? no  3.   Have you tested positive for COVID 19 since your procedure no  4.   Have you had any family members/close contacts diagnosed with the COVID 19 since your procedure?  no   If yes to any of these questions please route to Joylene John, RN and Joella Prince, RN

## 2020-02-29 NOTE — Telephone Encounter (Signed)
Attempted f/u phone call. No answer. Left message. °

## 2020-03-02 ENCOUNTER — Encounter: Payer: Self-pay | Admitting: Gastroenterology

## 2020-03-28 DIAGNOSIS — R972 Elevated prostate specific antigen [PSA]: Secondary | ICD-10-CM | POA: Diagnosis not present

## 2020-03-28 DIAGNOSIS — C61 Malignant neoplasm of prostate: Secondary | ICD-10-CM | POA: Diagnosis not present

## 2020-04-10 ENCOUNTER — Other Ambulatory Visit: Payer: Self-pay | Admitting: Urology

## 2020-04-10 ENCOUNTER — Other Ambulatory Visit (HOSPITAL_COMMUNITY): Payer: Self-pay | Admitting: Urology

## 2020-04-10 DIAGNOSIS — R3912 Poor urinary stream: Secondary | ICD-10-CM | POA: Diagnosis not present

## 2020-04-10 DIAGNOSIS — C61 Malignant neoplasm of prostate: Secondary | ICD-10-CM

## 2020-04-10 DIAGNOSIS — N403 Nodular prostate with lower urinary tract symptoms: Secondary | ICD-10-CM | POA: Diagnosis not present

## 2020-04-10 DIAGNOSIS — N5201 Erectile dysfunction due to arterial insufficiency: Secondary | ICD-10-CM | POA: Diagnosis not present

## 2020-04-18 DIAGNOSIS — C61 Malignant neoplasm of prostate: Secondary | ICD-10-CM | POA: Diagnosis not present

## 2020-04-18 DIAGNOSIS — Z794 Long term (current) use of insulin: Secondary | ICD-10-CM | POA: Diagnosis not present

## 2020-04-18 DIAGNOSIS — I10 Essential (primary) hypertension: Secondary | ICD-10-CM | POA: Diagnosis not present

## 2020-04-18 DIAGNOSIS — E1129 Type 2 diabetes mellitus with other diabetic kidney complication: Secondary | ICD-10-CM | POA: Diagnosis not present

## 2020-04-20 ENCOUNTER — Ambulatory Visit (HOSPITAL_COMMUNITY)
Admission: RE | Admit: 2020-04-20 | Discharge: 2020-04-20 | Disposition: A | Discharge status: Home / Self Care | Payer: Medicare Other | Source: Ambulatory Visit | Attending: Urology | Admitting: Urology

## 2020-04-20 ENCOUNTER — Other Ambulatory Visit: Payer: Self-pay

## 2020-04-20 ENCOUNTER — Encounter (HOSPITAL_COMMUNITY)
Admission: RE | Admit: 2020-04-20 | Discharge: 2020-04-20 | Disposition: A | Discharge status: Home / Self Care | Payer: Medicare Other | Source: Ambulatory Visit | Attending: Urology | Admitting: Urology

## 2020-04-20 DIAGNOSIS — C61 Malignant neoplasm of prostate: Secondary | ICD-10-CM | POA: Insufficient documentation

## 2020-05-21 ENCOUNTER — Telehealth: Payer: Self-pay | Admitting: Radiation Oncology

## 2020-05-21 NOTE — Telephone Encounter (Signed)
Received voicemail message from patient requesting return call. Phoned patient to inquire. Patient wanting to confirm consultation with Dr. Tammi Klippel for Friday, January 14th. Patient questions if he can bring his wife with him to the consult. Explained he can bring one guest with him to the consult so long as they are not experience covid symptoms or been exposed to anyone with covid. Explained to the patient he would have to wear a mask during his visit as well as his wife. Patient verbalized understanding of all reviewed and appreciation for the return call.

## 2020-05-24 ENCOUNTER — Encounter: Payer: Self-pay | Admitting: Radiation Oncology

## 2020-05-24 NOTE — Progress Notes (Signed)
GU Location of Tumor / Histology: prostatic adenocarcinoma  If Prostate Cancer, Gleason Score is (4 + 3) and PSA is (6.14). Prostate volume: 66 mL.  Kenneth [name] presented with an elevated PSA. Patient's father with history of prostate ca. Mother and sister have a history of breast ca.   Biopsies of prostate (if applicable) revealed:   Past/Anticipated interventions by urology, if any: Prostate biopsy, CT abd/pelvis (negative), bone scan (negative), referral to Dr. Tammi Klippel to discuss radioactive seed implants  Past/Anticipated interventions by medical oncology, if any: no  Weight changes, if any: denies  Bowel/Bladder complaints, if any: IPSS 14. SHIM 15. Denies dysuria, hematuria, urinary leakage or incontinence. Reports taking tamsulosin as prescribed. Denies any bowel complaints.   Nausea/Vomiting, if any: denies  Pain issues, if any:  denies  SAFETY ISSUES:  Prior radiation? denies  Pacemaker/ICD? denies  Possible current pregnancy? no, male patient  Is the patient on methotrexate? denies  Current Complaints / other details:  70 year old male. Married. Resides in [place].

## 2020-05-25 ENCOUNTER — Ambulatory Visit
Admission: RE | Admit: 2020-05-25 | Discharge: 2020-05-25 | Disposition: A | Discharge status: Home / Self Care | Payer: Medicare Other | Source: Ambulatory Visit | Attending: Radiation Oncology | Admitting: Radiation Oncology

## 2020-05-25 ENCOUNTER — Other Ambulatory Visit: Payer: Self-pay

## 2020-05-25 ENCOUNTER — Encounter: Payer: Self-pay | Admitting: Medical Oncology

## 2020-05-25 ENCOUNTER — Encounter: Payer: Self-pay | Admitting: Radiation Oncology

## 2020-05-25 VITALS — BP 163/51 | HR 66 | Temp 97.0°F | Resp 18 | Ht 70.0 in | Wt 245.0 lb

## 2020-05-25 DIAGNOSIS — E785 Hyperlipidemia, unspecified: Secondary | ICD-10-CM | POA: Insufficient documentation

## 2020-05-25 DIAGNOSIS — Z79899 Other long term (current) drug therapy: Secondary | ICD-10-CM | POA: Insufficient documentation

## 2020-05-25 DIAGNOSIS — Z7982 Long term (current) use of aspirin: Secondary | ICD-10-CM | POA: Diagnosis not present

## 2020-05-25 DIAGNOSIS — Z87891 Personal history of nicotine dependence: Secondary | ICD-10-CM | POA: Diagnosis not present

## 2020-05-25 DIAGNOSIS — Z86010 Personal history of colonic polyps: Secondary | ICD-10-CM | POA: Insufficient documentation

## 2020-05-25 DIAGNOSIS — C61 Malignant neoplasm of prostate: Secondary | ICD-10-CM | POA: Diagnosis not present

## 2020-05-25 DIAGNOSIS — I10 Essential (primary) hypertension: Secondary | ICD-10-CM | POA: Diagnosis not present

## 2020-05-25 DIAGNOSIS — G4730 Sleep apnea, unspecified: Secondary | ICD-10-CM | POA: Insufficient documentation

## 2020-05-25 DIAGNOSIS — R972 Elevated prostate specific antigen [PSA]: Secondary | ICD-10-CM | POA: Diagnosis not present

## 2020-05-25 DIAGNOSIS — E119 Type 2 diabetes mellitus without complications: Secondary | ICD-10-CM | POA: Insufficient documentation

## 2020-05-25 HISTORY — DX: Malignant neoplasm of prostate: C61

## 2020-05-25 NOTE — Progress Notes (Signed)
Radiation Oncology         [phone] ________________________________  Initial outpatient Consultation  Name: Kenneth [name] MRN: [id number]  Date: 05/25/2020  DOB: 1950/09/12  HA:[id number], Elta Guadeloupe, MD  Irine Seal, MD   REFERRING PHYSICIAN: Irine Seal, MD  DIAGNOSIS: 70 y.o. gentleman with Stage T2a adenocarcinoma of the prostate with Gleason score of 4+3, and PSA of 6.15.    ICD-10-CM   1. Malignant neoplasm of prostate ([place])  [facility] referral to Social Work    HISTORY OF PRESENT ILLNESS: Kenneth [name] is a 70 y.o. male with a diagnosis of prostate cancer. He has a history of elevated PSA, seen by Dr. Jeffie Pollock in 2011 but a repeat PSA normalized to 2.08 at that time. He continued to have his PSA followed with his PCP and more recently, was noted to have an elevated PSA of 6.149 by his primary care physician, Dr. Joylene Draft. This was increased from 4.75 in 2020. Accordingly, he was referred for evaluation in urology by Dr. Jeffie Pollock on 02/01/2020,  digital rectal examination was performed at that time revealing right lobe firmness on the mid lateral aspect.  The patient proceeded to transrectal ultrasound with 12 biopsies of the prostate on 03/28/2020.  The prostate volume measured 66 cc.  Out of 12 core biopsies, 4 were positive.  The maximum Gleason score was 4+3, and this was seen in the left base (small focus) and right base. Additionally, Gleason 3+4 was seen in the right mid (small focus) and right base lateral (with PNI).  He underwent staging CT and bone scan on 04/20/2020 both of which were negative for visceral or osseous metastatic disease.  The patient reviewed the biopsy results with his urologist and he has kindly been referred today for discussion of potential radiation treatment options.   PREVIOUS RADIATION THERAPY: No  PAST MEDICAL HISTORY:  Past Medical History:  Diagnosis Date  . Broken neck ([place])   . Diabetes mellitus   . 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 History of colon polyps - adenomas 04/03/2010  . Hyperlipidemia   . Hypertension   . OSA (obstructive sleep apnea)    refusing CPAP  . Prostate cancer ([place])       PAST SURGICAL HISTORY: Past Surgical History:  Procedure Laterality Date  . ANTERIOR CERVICAL DECOMP/DISCECTOMY FUSION N/A 10/01/2012   Procedure: ANTERIOR CERVICAL DECOMPRESSION/DISCECTOMY FUSION 2 LEVELS;  Surgeon: Eustace Moore, MD;  Location: [facility];  Service: Neurosurgery;  Laterality: N/A;  Cervical five-six,Cervical six-seven  . COLONOSCOPY  09/30/2010, 03/01/2014   diverticulosis, small internal hemorrhoids  . COLONOSCOPY W/ POLYPECTOMY  04/03/2010   4 polyps 8-31mm, worst TV adenoma with high-grade dysplasia  . NO PAST SURGERIES    . POLYPECTOMY    . POSTERIOR CERVICAL FUSION/FORAMINOTOMY Left 10/20/2012   Procedure: C/5-6,C/6-7 [place]/Multi level,POSTERIOR CERVICAL FUSION/FORAMINOTOMY C/4-7;  Surgeon: Eustace Moore, MD;  Location: [facility];  Service: Neurosurgery;  Laterality: Left;  Cervical Five-Six, Cervical Six-Seven Laminectomies, Posterior Cervical Fusion/Foraminotomies Cervical Four through Seven.   . TONSILLECTOMY      FAMILY HISTORY:  Family History  Problem Relation Age of Onset  . Diabetes Mother   . Stomach cancer Neg Hx   . Rectal cancer Neg Hx   . Pancreatic cancer Neg Hx   . Colon cancer Neg Hx   . Colon polyps Neg Hx   . Esophageal cancer Neg Hx   . Prostate cancer Neg Hx   .  Breast cancer Neg Hx     SOCIAL HISTORY: Still works full-time as a Engineer, maintenance (IT). He lives approximately 1 mile from the cancer center and works approximately 1 mile from his home. Social History   Socioeconomic History  . Marital status: Married    Spouse name: Not on file  . Number of children: Not on file  . Years of education: Not on file  . Highest education level: Not on file  Occupational History  . Not on file  Tobacco Use  . Smoking status: Former Smoker    Packs/day: 0.25    Years: 2.00    Pack  years: 0.50    Types: Cigarettes    Quit date: 09/22/1969    Years since quitting: 50.7  . Smokeless tobacco: Never Used  Vaping Use  . Vaping Use: Never used  Substance and Sexual Activity  . Alcohol use: No  . Drug use: No  . Sexual activity: Yes  Other Topics Concern  . Not on file  Social History Narrative  . Not on file   Social Determinants of Health   Financial Resource Strain: Not on file  Food Insecurity: Not on file  Transportation Needs: Not on file  Physical Activity: Not on file  Stress: Not on file  Social Connections: Not on file  Intimate Partner Violence: Not on file    ALLERGIES: Penicillins  MEDICATIONS:  Current Outpatient Medications  Medication Sig Dispense Refill  . acetaminophen (TYLENOL) 500 MG tablet Take 500 mg by mouth daily as needed for headache.    . albuterol (VENTOLIN HFA) 108 (90 Base) MCG/ACT inhaler     . ARNUITY ELLIPTA 100 MCG/ACT AEPB     . aspirin 81 MG tablet Take 81 mg by mouth daily.    Marland Kitchen atorvastatin (LIPITOR) 40 MG tablet Take 40 mg by mouth daily.    . B-D UF III MINI PEN NEEDLES 31G X 5 MM MISC Inject 1 Stick as directed as directed.    . benazepril (LOTENSIN) 40 MG tablet Take 1 tablet (40 mg total) by mouth daily. Please schedule appt for future refills. 1st attempt 30 tablet 0  . chlorthalidone (HYGROTON) 25 MG tablet Take 25 mg by mouth daily.    Marland Kitchen ezetimibe (ZETIA) 10 MG tablet     . metFORMIN (GLUCOPHAGE) 1000 MG tablet Take 1,000 mg by mouth 2 (two) times daily with a meal.    . NOVOLOG FLEXPEN 100 UNIT/ML FlexPen Inject 20 Units as directed 2 (two) times daily.   11  . tamsulosin (FLOMAX) 0.4 MG CAPS capsule Take 0.4 mg by mouth daily.    Marland Kitchen tobramycin-dexamethasone (TOBRADEX) ophthalmic solution     . TOUJEO SOLOSTAR 300 UNIT/ML Solostar Pen     . VITAMIN D PO Take by mouth.     No current facility-administered medications for this encounter.    REVIEW OF SYSTEMS:  On review of systems, the patient reports that  he is doing well overall. He denies any chest pain, shortness of breath, cough, fevers, chills, night sweats, unintended weight changes. He denies any bowel disturbances, and denies abdominal pain, nausea or vomiting. He denies any new musculoskeletal or joint aches or pains. His IPSS was 14, indicating moderate urinary symptoms despite taking tamsulosin daily as prescribed. His SHIM was 15, indicating he has moderate erectile dysfunction. A complete review of systems is obtained and is otherwise negative.    PHYSICAL EXAM:  Wt Readings from Last 3 Encounters:  05/25/20 245 lb (111.1 kg)  02/27/20 241 lb (109.3 kg)  02/13/20 241 lb (109.3 kg)   Temp Readings from Last 3 Encounters:  05/25/20 (!) 97 F (36.1 C) (Temporal)  02/27/20 (!) 97.2 F (36.2 C) (Temporal)  07/25/17 97.6 F (36.4 C) (Oral)   BP Readings from Last 3 Encounters:  05/25/20 (!) 163/51  02/27/20 (!) 145/76  06/29/19 126/60   Pulse Readings from Last 3 Encounters:  05/25/20 66  02/27/20 (!) 56  06/29/19 (!) 54   Pain Assessment Pain Score: 0-No pain/10  In general this is a well appearing Caucasian male in no acute distress. He's alert and oriented x4 and appropriate throughout the examination. Cardiopulmonary assessment is negative for acute distress, and he exhibits normal effort.    KPS = 100  100 - Normal; no complaints; no evidence of disease. 90   - Able to carry on normal activity; minor signs or symptoms of disease. 80   - Normal activity with effort; some signs or symptoms of disease. 62   - Cares for self; unable to carry on normal activity or to do active work. 60   - Requires occasional assistance, but is able to care for most of his personal needs. 50   - Requires considerable assistance and frequent medical care. 53   - Disabled; requires special care and assistance. 28   - Severely disabled; hospital admission is indicated although death not imminent. 54   - Very sick; hospital admission  necessary; active supportive treatment necessary. 10   - Moribund; fatal processes progressing rapidly. 0     - Dead  Karnofsky DA, Abelmann Ivins, Craver LS and Burchenal [facility] [phone]) The use of the nitrogen mustards in the palliative treatment of carcinoma: with particular reference to bronchogenic carcinoma Cancer 1 634-56  LABORATORY DATA:  Lab Results  Component Value Date   WBC 12.5 (H) 07/25/2017   HGB 12.1 (L) 07/25/2017   HCT 38.1 (L) 07/25/2017   MCV 89.2 07/25/2017   PLT 271 07/25/2017   Lab Results  Component Value Date   NA 141 07/25/2017   K 3.6 07/25/2017   CL 102 07/25/2017   CO2 31 07/25/2017   Lab Results  Component Value Date   ALT 13 (L) 07/25/2017   AST 15 07/25/2017   ALKPHOS 41 07/25/2017   BILITOT 0.3 07/25/2017     RADIOGRAPHY: No results found.    IMPRESSION/PLAN: 1. 70 y.o. gentleman with Stage T2a adenocarcinoma of the prostate with Gleason Score of 4+3, and PSA of 6.15. We discussed the patient's workup and outlined the nature of prostate cancer in this setting. The patient's T stage, Gleason's score, and PSA put him into the unfavorable intermediate risk group. Accordingly, he is eligible for a variety of potential treatment options including brachytherapy, 5.5 weeks of external radiation or prostatectomy. We discussed the available radiation techniques, and focused on the details and logistics of delivery. The patient is not felt to be an ideal candidate for brachytherapy with a prostate volume of 66 cc and moderate LUTS despite medical therapy. We did discuss the option to begin treatment with a 5 ARI to shrink the prostate, prior to proceeding with brachytherapy if that is the direction he wishes to proceed. We discussed and outlined the risks, benefits, short and long-term effects associated with radiotherapy and compared and contrasted these with prostatectomy. We discussed the role of SpaceOAR gel in reducing the rectal toxicity associated with  radiotherapy.  He appears to have a good understanding of his disease and  our treatment recommendations which are of curative intent.  He and his wife were encouraged to ask questions that were answered to their stated satisfaction.  At the conclusion of our conversation, the patient appears to be leaning towards proceeding with 5-1/2 weeks of prostate IMRT but is still also giving consideration to brachytherapy. He and his wife would like to take some additional time to consider their options prior to making a final decision. We will share our discussion with Dr. Jeffie Pollock and look forward to hearing back from the patient in the near future regarding his final treatment preference. Once he reaches a decision, we will proceed with treatment planning accordingly at that time, either scheduling brachytherapy or coordinating for fiducial markers and SpaceOAR gel placement prior to CT simulation for prostate IMRT. We enjoyed meeting him and his wife today and look forward to continuing to participate in his care.    Nicholos Johns, PA-C    Tyler Pita, MD  [facility] Direct Dial: [phone]  Fax: [phone] .com  Skype  LinkedIn   This document serves as a record of services personally performed by Tyler Pita, MD and Freeman Caldron, PA-C. It was created on their behalf by Wilburn Mylar, a trained medical scribe. The creation of this record is based on the scribe's personal observations and the provider's statements to them. This document has been checked and approved by the attending provider.

## 2020-05-25 NOTE — Progress Notes (Signed)
Introduced myself to patient and his wife as the prostate nurse navigator and discussed my role. He is here to discuss his radiation treatment options.No family history of prostate cancer but mother and sister diagnosed with  breast cancer. No barriers to care identified at this time. I gave them my business card and asked them to call me with questions or concerns. They voiced understanding.

## 2020-05-28 ENCOUNTER — Encounter: Payer: Self-pay | Admitting: Licensed Clinical Social Worker

## 2020-05-28 NOTE — Progress Notes (Signed)
[facility] Psychosocial Distress Screening Clinical Social Work  Clinical Social Work was referred by distress screening protocol.  The patient scored a 6 on the Psychosocial Distress Thermometer which indicates moderate distress. Clinical Social Worker contacted patient by phone to assess for distress and other psychosocial needs.   Patient reports to be coping well so far. He feels grateful for the team here and has made a decision on what treatment he would like to pursue. He has good support from his wife and no resource needs at this time.  CSW provided information on support services and gave direct contact information.  ONCBCN DISTRESS SCREENING 05/25/2020  Screening Type Initial Screening  Distress experienced in past week (1-10) 6  Emotional problem type Nervousness/Anxiety;Adjusting to illness  Information Concerns Type Lack of info about treatment;Lack of info about complementary therapy choices  Physician notified of physical symptoms Yes  Referral to clinical psychology No  Referral to clinical social work Yes  Referral to dietition No  Referral to financial advocate No  Referral to support programs Yes  Referral to palliative care No   Clinical Social Worker follow up needed: No.  If yes, follow up plan:  Marcedes Tech E Nitara Szczerba, LCSW

## 2020-05-29 ENCOUNTER — Encounter: Payer: Self-pay | Admitting: Medical Oncology

## 2020-05-29 NOTE — Progress Notes (Signed)
Patient called with his treatment decision. He has opted for external beam. We discussed next step will be gold markers/SpaceOar gel that will be placed by Dr. Jeffie Pollock. He states he will be out of town 1/29-2/9. I informed him, Enid Derry will schedule this appointment with [facility] and I will share these dates. I asked him to call me with questions or concerns. I informed Ashlyn, PA and Enid Derry of his decision.

## 2020-06-28 DIAGNOSIS — C61 Malignant neoplasm of prostate: Secondary | ICD-10-CM | POA: Diagnosis not present

## 2020-06-29 DIAGNOSIS — E1129 Type 2 diabetes mellitus with other diabetic kidney complication: Secondary | ICD-10-CM | POA: Diagnosis not present

## 2020-06-29 DIAGNOSIS — Z794 Long term (current) use of insulin: Secondary | ICD-10-CM | POA: Diagnosis not present

## 2020-06-29 DIAGNOSIS — I10 Essential (primary) hypertension: Secondary | ICD-10-CM | POA: Diagnosis not present

## 2020-06-29 DIAGNOSIS — C61 Malignant neoplasm of prostate: Secondary | ICD-10-CM | POA: Diagnosis not present

## 2020-07-02 ENCOUNTER — Telehealth: Payer: Self-pay | Admitting: *Deleted

## 2020-07-02 NOTE — Telephone Encounter (Signed)
CALLED PATIENT TO REMIND OF SIM APPT. FOR 07-03-20- ARRIVAL TIME- 8:15 AM @ [facility], SPOKE WITH PATIENT AND HE IS AWARE OF THIS APPT.

## 2020-07-02 NOTE — Progress Notes (Signed)
°  Radiation Oncology         [phone]) [phone] ________________________________  Name: Kenneth [name] MRN: [id number]  Date: 07/03/2020  DOB: [birth year]  SIMULATION AND TREATMENT PLANNING NOTE    ICD-10-CM   1. Malignant neoplasm of prostate ([place])  C61     DIAGNOSIS:  70 y.o. gentleman with Stage T2a adenocarcinoma of the prostate with Gleason Score of 4+3, and PSA of 6.15.  NARRATIVE:  The patient was brought to the [facility].  Identity was confirmed.  All relevant records and images related to the planned course of therapy were reviewed.  The patient freely provided informed written consent to proceed with treatment after reviewing the details related to the planned course of therapy. The consent form was witnessed and verified by the simulation staff.  Then, the patient was set-up in a stable reproducible supine position for radiation therapy.  A vacuum lock pillow device was custom fabricated to position his legs in a reproducible immobilized position.  Then, I performed a urethrogram under sterile conditions to identify the prostatic apex.  CT images were obtained.  Surface markings were placed.  The CT images were loaded into the planning software.  Then the prostate target and avoidance structures including the rectum, bladder, bowel and hips were contoured.  Treatment planning then occurred.  The radiation prescription was entered and confirmed.  A total of one complex treatment devices was fabricated. I have requested : Intensity Modulated Radiotherapy (IMRT) is medically necessary for this case for the following reason:  Rectal sparing.Marland Kitchen  PLAN:  The patient will receive 70 Gy in 28 fractions.  ________________________________  Sheral Apley Tammi Klippel, M.D.  This document serves as a record of services personally performed by Tyler Pita, MD. It was created on his behalf by Wilburn Mylar, a trained medical scribe. The creation of this record is based on the scribe's  personal observations and the provider's statements to them. This document has been checked and approved by the attending provider.

## 2020-07-03 ENCOUNTER — Encounter: Payer: Self-pay | Admitting: Medical Oncology

## 2020-07-03 ENCOUNTER — Ambulatory Visit
Admission: RE | Admit: 2020-07-03 | Discharge: 2020-07-03 | Disposition: A | Discharge status: Home / Self Care | Payer: Medicare Other | Source: Ambulatory Visit | Attending: Radiation Oncology | Admitting: Radiation Oncology

## 2020-07-03 ENCOUNTER — Other Ambulatory Visit: Payer: Self-pay

## 2020-07-03 ENCOUNTER — Ambulatory Visit: Payer: Medicare Other | Admitting: Radiation Oncology

## 2020-07-03 DIAGNOSIS — Z510 Encounter for antineoplastic radiation therapy: Secondary | ICD-10-CM | POA: Diagnosis not present

## 2020-07-03 DIAGNOSIS — C61 Malignant neoplasm of prostate: Secondary | ICD-10-CM | POA: Diagnosis not present

## 2020-07-05 DIAGNOSIS — C61 Malignant neoplasm of prostate: Secondary | ICD-10-CM | POA: Diagnosis not present

## 2020-07-05 DIAGNOSIS — Z510 Encounter for antineoplastic radiation therapy: Secondary | ICD-10-CM | POA: Diagnosis not present

## 2020-07-12 ENCOUNTER — Other Ambulatory Visit: Payer: Self-pay

## 2020-07-12 ENCOUNTER — Ambulatory Visit
Admission: RE | Admit: 2020-07-12 | Discharge: 2020-07-12 | Disposition: A | Discharge status: Home / Self Care | Payer: Medicare Other | Source: Ambulatory Visit | Attending: Radiation Oncology | Admitting: Radiation Oncology

## 2020-07-12 DIAGNOSIS — Z510 Encounter for antineoplastic radiation therapy: Secondary | ICD-10-CM | POA: Insufficient documentation

## 2020-07-12 DIAGNOSIS — C61 Malignant neoplasm of prostate: Secondary | ICD-10-CM | POA: Insufficient documentation

## 2020-07-13 ENCOUNTER — Other Ambulatory Visit: Payer: Self-pay

## 2020-07-13 ENCOUNTER — Ambulatory Visit
Admission: RE | Admit: 2020-07-13 | Discharge: 2020-07-13 | Disposition: A | Discharge status: Home / Self Care | Payer: Medicare Other | Source: Ambulatory Visit | Attending: Radiation Oncology | Admitting: Radiation Oncology

## 2020-07-13 DIAGNOSIS — Z510 Encounter for antineoplastic radiation therapy: Secondary | ICD-10-CM | POA: Diagnosis not present

## 2020-07-13 DIAGNOSIS — C61 Malignant neoplasm of prostate: Secondary | ICD-10-CM | POA: Diagnosis not present

## 2020-07-13 NOTE — Progress Notes (Signed)
Complete post sim education with patient today. Oriented patient to staff and routine of the clinic. Provided patient with RADIATION THERAPY AND YOU handbook then, reviewed pertinent information. Educated patient reference potential side effects and management such as fatigue, diarrhea and urinary/bladder changes. Answered all patient questions to the best of my ability. Provided patient with my business card and encouraged him to call with future needs. Patient verbalized understanding of all reviewed.  

## 2020-07-16 ENCOUNTER — Ambulatory Visit
Admission: RE | Admit: 2020-07-16 | Discharge: 2020-07-16 | Disposition: A | Discharge status: Home / Self Care | Payer: Medicare Other | Source: Ambulatory Visit | Attending: Radiation Oncology | Admitting: Radiation Oncology

## 2020-07-16 ENCOUNTER — Other Ambulatory Visit: Payer: Self-pay

## 2020-07-16 DIAGNOSIS — Z510 Encounter for antineoplastic radiation therapy: Secondary | ICD-10-CM | POA: Diagnosis not present

## 2020-07-16 DIAGNOSIS — C61 Malignant neoplasm of prostate: Secondary | ICD-10-CM | POA: Diagnosis not present

## 2020-07-17 ENCOUNTER — Other Ambulatory Visit: Payer: Self-pay

## 2020-07-17 ENCOUNTER — Ambulatory Visit
Admission: RE | Admit: 2020-07-17 | Discharge: 2020-07-17 | Disposition: A | Discharge status: Home / Self Care | Payer: Medicare Other | Source: Ambulatory Visit | Attending: Radiation Oncology | Admitting: Radiation Oncology

## 2020-07-17 DIAGNOSIS — E1129 Type 2 diabetes mellitus with other diabetic kidney complication: Secondary | ICD-10-CM | POA: Diagnosis not present

## 2020-07-17 DIAGNOSIS — C61 Malignant neoplasm of prostate: Secondary | ICD-10-CM | POA: Diagnosis not present

## 2020-07-17 DIAGNOSIS — I10 Essential (primary) hypertension: Secondary | ICD-10-CM | POA: Diagnosis not present

## 2020-07-17 DIAGNOSIS — Z794 Long term (current) use of insulin: Secondary | ICD-10-CM | POA: Diagnosis not present

## 2020-07-17 DIAGNOSIS — Z510 Encounter for antineoplastic radiation therapy: Secondary | ICD-10-CM | POA: Diagnosis not present

## 2020-07-17 DIAGNOSIS — E785 Hyperlipidemia, unspecified: Secondary | ICD-10-CM | POA: Diagnosis not present

## 2020-07-18 ENCOUNTER — Ambulatory Visit
Admission: RE | Admit: 2020-07-18 | Discharge: 2020-07-18 | Disposition: A | Discharge status: Home / Self Care | Payer: Medicare Other | Source: Ambulatory Visit | Attending: Radiation Oncology | Admitting: Radiation Oncology

## 2020-07-18 ENCOUNTER — Other Ambulatory Visit: Payer: Self-pay

## 2020-07-18 DIAGNOSIS — Z510 Encounter for antineoplastic radiation therapy: Secondary | ICD-10-CM | POA: Diagnosis not present

## 2020-07-18 DIAGNOSIS — C61 Malignant neoplasm of prostate: Secondary | ICD-10-CM | POA: Diagnosis not present

## 2020-07-19 ENCOUNTER — Ambulatory Visit
Admission: RE | Admit: 2020-07-19 | Discharge: 2020-07-19 | Disposition: A | Discharge status: Home / Self Care | Payer: Medicare Other | Source: Ambulatory Visit | Attending: Radiation Oncology | Admitting: Radiation Oncology

## 2020-07-19 ENCOUNTER — Other Ambulatory Visit: Payer: Self-pay

## 2020-07-19 DIAGNOSIS — Z510 Encounter for antineoplastic radiation therapy: Secondary | ICD-10-CM | POA: Diagnosis not present

## 2020-07-19 DIAGNOSIS — C61 Malignant neoplasm of prostate: Secondary | ICD-10-CM | POA: Diagnosis not present

## 2020-07-20 ENCOUNTER — Other Ambulatory Visit: Payer: Self-pay

## 2020-07-20 ENCOUNTER — Ambulatory Visit
Admission: RE | Admit: 2020-07-20 | Discharge: 2020-07-20 | Disposition: A | Discharge status: Home / Self Care | Payer: Medicare Other | Source: Ambulatory Visit | Attending: Radiation Oncology | Admitting: Radiation Oncology

## 2020-07-20 DIAGNOSIS — C61 Malignant neoplasm of prostate: Secondary | ICD-10-CM | POA: Diagnosis not present

## 2020-07-20 DIAGNOSIS — Z510 Encounter for antineoplastic radiation therapy: Secondary | ICD-10-CM | POA: Diagnosis not present

## 2020-07-23 ENCOUNTER — Ambulatory Visit
Admission: RE | Admit: 2020-07-23 | Discharge: 2020-07-23 | Disposition: A | Discharge status: Home / Self Care | Payer: Medicare Other | Source: Ambulatory Visit | Attending: Radiation Oncology | Admitting: Radiation Oncology

## 2020-07-23 DIAGNOSIS — Z510 Encounter for antineoplastic radiation therapy: Secondary | ICD-10-CM | POA: Diagnosis not present

## 2020-07-23 DIAGNOSIS — C61 Malignant neoplasm of prostate: Secondary | ICD-10-CM | POA: Diagnosis not present

## 2020-07-24 ENCOUNTER — Other Ambulatory Visit: Payer: Self-pay

## 2020-07-24 ENCOUNTER — Ambulatory Visit
Admission: RE | Admit: 2020-07-24 | Discharge: 2020-07-24 | Disposition: A | Discharge status: Home / Self Care | Payer: Medicare Other | Source: Ambulatory Visit | Attending: Radiation Oncology | Admitting: Radiation Oncology

## 2020-07-24 DIAGNOSIS — Z510 Encounter for antineoplastic radiation therapy: Secondary | ICD-10-CM | POA: Diagnosis not present

## 2020-07-24 DIAGNOSIS — C61 Malignant neoplasm of prostate: Secondary | ICD-10-CM | POA: Diagnosis not present

## 2020-07-25 ENCOUNTER — Ambulatory Visit
Admission: RE | Admit: 2020-07-25 | Discharge: 2020-07-25 | Disposition: A | Discharge status: Home / Self Care | Payer: Medicare Other | Source: Ambulatory Visit | Attending: Radiation Oncology | Admitting: Radiation Oncology

## 2020-07-25 DIAGNOSIS — C61 Malignant neoplasm of prostate: Secondary | ICD-10-CM | POA: Diagnosis not present

## 2020-07-25 DIAGNOSIS — Z510 Encounter for antineoplastic radiation therapy: Secondary | ICD-10-CM | POA: Diagnosis not present

## 2020-07-26 ENCOUNTER — Ambulatory Visit
Admission: RE | Admit: 2020-07-26 | Discharge: 2020-07-26 | Disposition: A | Discharge status: Home / Self Care | Payer: Medicare Other | Source: Ambulatory Visit | Attending: Radiation Oncology | Admitting: Radiation Oncology

## 2020-07-26 ENCOUNTER — Other Ambulatory Visit: Payer: Self-pay

## 2020-07-26 DIAGNOSIS — Z510 Encounter for antineoplastic radiation therapy: Secondary | ICD-10-CM | POA: Diagnosis not present

## 2020-07-26 DIAGNOSIS — C61 Malignant neoplasm of prostate: Secondary | ICD-10-CM | POA: Diagnosis not present

## 2020-07-27 ENCOUNTER — Other Ambulatory Visit: Payer: Self-pay

## 2020-07-27 ENCOUNTER — Ambulatory Visit
Admission: RE | Admit: 2020-07-27 | Discharge: 2020-07-27 | Disposition: A | Discharge status: Home / Self Care | Payer: Medicare Other | Source: Ambulatory Visit | Attending: Radiation Oncology | Admitting: Radiation Oncology

## 2020-07-27 DIAGNOSIS — Z510 Encounter for antineoplastic radiation therapy: Secondary | ICD-10-CM | POA: Diagnosis not present

## 2020-07-27 DIAGNOSIS — C61 Malignant neoplasm of prostate: Secondary | ICD-10-CM | POA: Diagnosis not present

## 2020-07-30 ENCOUNTER — Ambulatory Visit
Admission: RE | Admit: 2020-07-30 | Discharge: 2020-07-30 | Disposition: A | Discharge status: Home / Self Care | Payer: Medicare Other | Source: Ambulatory Visit | Attending: Radiation Oncology | Admitting: Radiation Oncology

## 2020-07-30 DIAGNOSIS — C61 Malignant neoplasm of prostate: Secondary | ICD-10-CM | POA: Diagnosis not present

## 2020-07-30 DIAGNOSIS — Z510 Encounter for antineoplastic radiation therapy: Secondary | ICD-10-CM | POA: Diagnosis not present

## 2020-07-31 ENCOUNTER — Ambulatory Visit
Admission: RE | Admit: 2020-07-31 | Discharge: 2020-07-31 | Disposition: A | Discharge status: Home / Self Care | Payer: Medicare Other | Source: Ambulatory Visit | Attending: Radiation Oncology | Admitting: Radiation Oncology

## 2020-07-31 ENCOUNTER — Other Ambulatory Visit: Payer: Self-pay

## 2020-07-31 DIAGNOSIS — Z510 Encounter for antineoplastic radiation therapy: Secondary | ICD-10-CM | POA: Diagnosis not present

## 2020-07-31 DIAGNOSIS — C61 Malignant neoplasm of prostate: Secondary | ICD-10-CM | POA: Diagnosis not present

## 2020-08-01 ENCOUNTER — Ambulatory Visit
Admission: RE | Admit: 2020-08-01 | Discharge: 2020-08-01 | Disposition: A | Discharge status: Home / Self Care | Payer: Medicare Other | Source: Ambulatory Visit | Attending: Radiation Oncology | Admitting: Radiation Oncology

## 2020-08-01 DIAGNOSIS — C61 Malignant neoplasm of prostate: Secondary | ICD-10-CM | POA: Diagnosis not present

## 2020-08-01 DIAGNOSIS — Z510 Encounter for antineoplastic radiation therapy: Secondary | ICD-10-CM | POA: Diagnosis not present

## 2020-08-02 ENCOUNTER — Ambulatory Visit
Admission: RE | Admit: 2020-08-02 | Discharge: 2020-08-02 | Disposition: A | Discharge status: Home / Self Care | Payer: Medicare Other | Source: Ambulatory Visit | Attending: Radiation Oncology | Admitting: Radiation Oncology

## 2020-08-02 ENCOUNTER — Other Ambulatory Visit: Payer: Self-pay

## 2020-08-02 DIAGNOSIS — Z510 Encounter for antineoplastic radiation therapy: Secondary | ICD-10-CM | POA: Diagnosis not present

## 2020-08-02 DIAGNOSIS — C61 Malignant neoplasm of prostate: Secondary | ICD-10-CM | POA: Diagnosis not present

## 2020-08-03 ENCOUNTER — Ambulatory Visit
Admission: RE | Admit: 2020-08-03 | Discharge: 2020-08-03 | Disposition: A | Discharge status: Home / Self Care | Payer: Medicare Other | Source: Ambulatory Visit | Attending: Radiation Oncology | Admitting: Radiation Oncology

## 2020-08-03 DIAGNOSIS — C61 Malignant neoplasm of prostate: Secondary | ICD-10-CM | POA: Diagnosis not present

## 2020-08-03 DIAGNOSIS — Z510 Encounter for antineoplastic radiation therapy: Secondary | ICD-10-CM | POA: Diagnosis not present

## 2020-08-06 ENCOUNTER — Ambulatory Visit
Admission: RE | Admit: 2020-08-06 | Discharge: 2020-08-06 | Disposition: A | Discharge status: Home / Self Care | Payer: Medicare Other | Source: Ambulatory Visit | Attending: Radiation Oncology | Admitting: Radiation Oncology

## 2020-08-06 ENCOUNTER — Other Ambulatory Visit: Payer: Self-pay

## 2020-08-06 DIAGNOSIS — Z510 Encounter for antineoplastic radiation therapy: Secondary | ICD-10-CM | POA: Diagnosis not present

## 2020-08-06 DIAGNOSIS — C61 Malignant neoplasm of prostate: Secondary | ICD-10-CM | POA: Diagnosis not present

## 2020-08-07 ENCOUNTER — Ambulatory Visit
Admission: RE | Admit: 2020-08-07 | Discharge: 2020-08-07 | Disposition: A | Discharge status: Home / Self Care | Payer: Medicare Other | Source: Ambulatory Visit | Attending: Radiation Oncology | Admitting: Radiation Oncology

## 2020-08-07 DIAGNOSIS — Z510 Encounter for antineoplastic radiation therapy: Secondary | ICD-10-CM | POA: Diagnosis not present

## 2020-08-07 DIAGNOSIS — C61 Malignant neoplasm of prostate: Secondary | ICD-10-CM | POA: Diagnosis not present

## 2020-08-08 ENCOUNTER — Other Ambulatory Visit: Payer: Self-pay

## 2020-08-08 ENCOUNTER — Ambulatory Visit: Admission: RE | Admit: 2020-08-08 | Payer: Medicare Other | Source: Ambulatory Visit

## 2020-08-09 ENCOUNTER — Ambulatory Visit
Admission: RE | Admit: 2020-08-09 | Discharge: 2020-08-09 | Disposition: A | Discharge status: Home / Self Care | Payer: Medicare Other | Source: Ambulatory Visit | Attending: Radiation Oncology | Admitting: Radiation Oncology

## 2020-08-09 ENCOUNTER — Other Ambulatory Visit: Payer: Self-pay

## 2020-08-09 DIAGNOSIS — Z510 Encounter for antineoplastic radiation therapy: Secondary | ICD-10-CM | POA: Diagnosis not present

## 2020-08-09 DIAGNOSIS — C61 Malignant neoplasm of prostate: Secondary | ICD-10-CM | POA: Diagnosis not present

## 2020-08-10 ENCOUNTER — Ambulatory Visit
Admission: RE | Admit: 2020-08-10 | Discharge: 2020-08-10 | Disposition: A | Discharge status: Home / Self Care | Payer: Medicare Other | Source: Ambulatory Visit | Attending: Radiation Oncology | Admitting: Radiation Oncology

## 2020-08-10 ENCOUNTER — Other Ambulatory Visit: Payer: Self-pay

## 2020-08-10 DIAGNOSIS — C61 Malignant neoplasm of prostate: Secondary | ICD-10-CM | POA: Diagnosis not present

## 2020-08-10 DIAGNOSIS — Z510 Encounter for antineoplastic radiation therapy: Secondary | ICD-10-CM | POA: Insufficient documentation

## 2020-08-13 ENCOUNTER — Ambulatory Visit
Admission: RE | Admit: 2020-08-13 | Discharge: 2020-08-13 | Disposition: A | Discharge status: Home / Self Care | Payer: Medicare Other | Source: Ambulatory Visit | Attending: Radiation Oncology | Admitting: Radiation Oncology

## 2020-08-13 ENCOUNTER — Other Ambulatory Visit: Payer: Self-pay

## 2020-08-13 DIAGNOSIS — Z510 Encounter for antineoplastic radiation therapy: Secondary | ICD-10-CM | POA: Diagnosis not present

## 2020-08-13 DIAGNOSIS — C61 Malignant neoplasm of prostate: Secondary | ICD-10-CM | POA: Diagnosis not present

## 2020-08-14 ENCOUNTER — Ambulatory Visit
Admission: RE | Admit: 2020-08-14 | Discharge: 2020-08-14 | Disposition: A | Discharge status: Home / Self Care | Payer: Medicare Other | Source: Ambulatory Visit | Attending: Radiation Oncology | Admitting: Radiation Oncology

## 2020-08-14 DIAGNOSIS — Z510 Encounter for antineoplastic radiation therapy: Secondary | ICD-10-CM | POA: Diagnosis not present

## 2020-08-14 DIAGNOSIS — C61 Malignant neoplasm of prostate: Secondary | ICD-10-CM | POA: Diagnosis not present

## 2020-08-15 ENCOUNTER — Other Ambulatory Visit: Payer: Self-pay

## 2020-08-15 ENCOUNTER — Ambulatory Visit
Admission: RE | Admit: 2020-08-15 | Discharge: 2020-08-15 | Disposition: A | Discharge status: Home / Self Care | Payer: Medicare Other | Source: Ambulatory Visit | Attending: Radiation Oncology | Admitting: Radiation Oncology

## 2020-08-15 DIAGNOSIS — Z510 Encounter for antineoplastic radiation therapy: Secondary | ICD-10-CM | POA: Diagnosis not present

## 2020-08-15 DIAGNOSIS — C61 Malignant neoplasm of prostate: Secondary | ICD-10-CM | POA: Diagnosis not present

## 2020-08-16 ENCOUNTER — Telehealth: Payer: Self-pay

## 2020-08-16 ENCOUNTER — Ambulatory Visit
Admission: RE | Admit: 2020-08-16 | Discharge: 2020-08-16 | Disposition: A | Discharge status: Home / Self Care | Payer: Medicare Other | Source: Ambulatory Visit | Attending: Radiation Oncology | Admitting: Radiation Oncology

## 2020-08-16 DIAGNOSIS — Z510 Encounter for antineoplastic radiation therapy: Secondary | ICD-10-CM | POA: Diagnosis not present

## 2020-08-16 DIAGNOSIS — C61 Malignant neoplasm of prostate: Secondary | ICD-10-CM | POA: Diagnosis not present

## 2020-08-16 NOTE — Telephone Encounter (Signed)
Returning phone call °

## 2020-08-17 ENCOUNTER — Ambulatory Visit
Admission: RE | Admit: 2020-08-17 | Discharge: 2020-08-17 | Disposition: A | Discharge status: Home / Self Care | Payer: Medicare Other | Source: Ambulatory Visit | Attending: Radiation Oncology | Admitting: Radiation Oncology

## 2020-08-17 DIAGNOSIS — C61 Malignant neoplasm of prostate: Secondary | ICD-10-CM | POA: Diagnosis not present

## 2020-08-17 DIAGNOSIS — Z510 Encounter for antineoplastic radiation therapy: Secondary | ICD-10-CM | POA: Diagnosis not present

## 2020-08-20 ENCOUNTER — Other Ambulatory Visit: Payer: Self-pay

## 2020-08-20 ENCOUNTER — Ambulatory Visit
Admission: RE | Admit: 2020-08-20 | Discharge: 2020-08-20 | Disposition: A | Discharge status: Home / Self Care | Payer: Medicare Other | Source: Ambulatory Visit | Attending: Radiation Oncology | Admitting: Radiation Oncology

## 2020-08-20 ENCOUNTER — Ambulatory Visit: Payer: Medicare Other

## 2020-08-20 DIAGNOSIS — C61 Malignant neoplasm of prostate: Secondary | ICD-10-CM | POA: Diagnosis not present

## 2020-08-20 DIAGNOSIS — Z510 Encounter for antineoplastic radiation therapy: Secondary | ICD-10-CM | POA: Diagnosis not present

## 2020-08-21 ENCOUNTER — Ambulatory Visit
Admission: RE | Admit: 2020-08-21 | Discharge: 2020-08-21 | Disposition: A | Discharge status: Home / Self Care | Payer: Medicare Other | Source: Ambulatory Visit | Attending: Radiation Oncology | Admitting: Radiation Oncology

## 2020-08-21 ENCOUNTER — Encounter: Payer: Self-pay | Admitting: Urology

## 2020-08-21 DIAGNOSIS — Z510 Encounter for antineoplastic radiation therapy: Secondary | ICD-10-CM | POA: Diagnosis not present

## 2020-08-21 DIAGNOSIS — C61 Malignant neoplasm of prostate: Secondary | ICD-10-CM | POA: Diagnosis not present

## 2020-08-28 ENCOUNTER — Other Ambulatory Visit: Payer: No Typology Code available for payment source

## 2020-08-28 ENCOUNTER — Other Ambulatory Visit: Payer: Self-pay

## 2020-08-28 ENCOUNTER — Ambulatory Visit
Admission: RE | Admit: 2020-08-28 | Discharge: 2020-08-28 | Disposition: A | Discharge status: Home / Self Care | Payer: Medicare Other | Source: Ambulatory Visit | Attending: Internal Medicine | Admitting: Internal Medicine

## 2020-08-28 DIAGNOSIS — R918 Other nonspecific abnormal finding of lung field: Secondary | ICD-10-CM | POA: Diagnosis not present

## 2020-08-28 DIAGNOSIS — R911 Solitary pulmonary nodule: Secondary | ICD-10-CM

## 2020-09-04 DIAGNOSIS — Z23 Encounter for immunization: Secondary | ICD-10-CM | POA: Diagnosis not present

## 2020-09-13 NOTE — Progress Notes (Signed)
Patient IPPS is a 4. Patient  Denies any dysuria ,hematuria,leakage or urgency. Patient reports nocturia x 3. Patient states that he empties his bladder with urination.Patient reports a moderate stream . Patient denies any issues with his bowels. Patient denies having to push or strain to start stream. Patient states that he has a continuous stream.Patient states that he has to schedule an appoinment  with his urologist.

## 2020-09-20 ENCOUNTER — Other Ambulatory Visit: Payer: Self-pay

## 2020-09-20 ENCOUNTER — Ambulatory Visit
Admission: RE | Admit: 2020-09-20 | Discharge: 2020-09-20 | Disposition: A | Discharge status: Home / Self Care | Payer: Medicare Other | Source: Ambulatory Visit | Attending: Urology | Admitting: Urology

## 2020-09-20 DIAGNOSIS — C61 Malignant neoplasm of prostate: Secondary | ICD-10-CM

## 2020-09-20 NOTE — Progress Notes (Signed)
  Radiation Oncology         [phone] ________________________________  Name: CADIN LUKA MRN: [id number]  Date: 08/21/2020  DOB: 06/03/1950  End of Treatment Note  Diagnosis:   70 y.o.gentleman with Stage T2aadenocarcinoma of the prostate with Gleason Score of 4+3, and PSA of6.15.     Indication for treatment:  Curative, Definitive Radiotherapy       Radiation treatment dates:   07/12/20 - 08/21/20  Site/dose:   The prostate was treated to 70 Gy in 28 fractions of 2.5 Gy  Beams/energy:   The patient was treated with IMRT using volumetric arc therapy delivering 6 MV X-rays to clockwise and counterclockwise circumferential arcs with a 90 degree collimator offset to avoid dose scalloping.  Image guidance was performed with daily cone beam CT prior to each fraction to align to gold markers in the prostate and assure proper bladder and rectal fill volumes.  Immobilization was achieved with BodyFix custom mold.  Narrative: The patient tolerated radiation treatment relatively well with only minor urinary irritation and modest fatigue.  He did experience increased nocturia every hour, hesitancy and urgency despite taking Flomax BID. He also had some diarrhea that was managed with Imodium prn but this often resulted in constipation.  Plan: The patient has completed radiation treatment. He will return to radiation oncology clinic for routine followup in one month. I advised him to call or return sooner if he has any questions or concerns related to his recovery or treatment. ________________________________  Sheral Apley. Tammi Klippel, M.D.

## 2020-09-20 NOTE — Progress Notes (Signed)
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  Interval Since Last Radiation:  4 weeks  07/12/20 - 08/21/20:  The prostate was treated to 70 Gy in 28 fractions of 2.5 Gy  Narrative:  I spoke with the patient to conduct his routine scheduled 1 month follow up visit via telephone to spare the patient unnecessary potential exposure in the healthcare setting during the current COVID-19 pandemic.  The patient was notified in advance and gave permission to proceed with this visit format.  He tolerated radiation treatment relatively well with only minor urinary irritation and modest fatigue.  He did experience increased nocturia every hour, hesitancy and urgency despite taking Flomax BID. He also had some diarrhea that was managed with Imodium prn but this often resulted in constipation.                              On review of systems, the patient states he reports that he is doing very well in general.  His LUTS have gradually improved and he feels that he is almost back to his baseline at this point.  He continues with nocturia x3 per night but significantly improved from every hour which he is extremely pleased with.  He specifically denies dysuria, gross hematuria, straining to void, incomplete bladder emptying or incontinence.  He denies abdominal pain, nausea, vomiting, diarrhea or constipation.  He reports a healthy appetite and is maintaining his weight.  His energy level is gradually improving, he is quite pleased with his progress to date.  ALLERGIES:  is allergic to penicillins.  Meds: Current Outpatient Medications  Medication Sig Dispense Refill  . acetaminophen (TYLENOL) 500 MG tablet Take 500 mg by mouth  daily as needed for headache.    Marland Kitchen aspirin 81 MG tablet Take 81 mg by mouth daily.    Marland Kitchen atorvastatin (LIPITOR) 40 MG tablet Take 40 mg by mouth daily.    . B-D UF III MINI PEN NEEDLES 31G X 5 MM MISC Inject 1 Stick as directed as directed.    . benazepril (LOTENSIN) 40 MG tablet Take 1 tablet (40 mg total) by mouth daily. Please schedule appt for future refills. 1st attempt 30 tablet 0  . chlorthalidone (HYGROTON) 25 MG tablet Take 25 mg by mouth daily.    Marland Kitchen ezetimibe (ZETIA) 10 MG tablet     . metFORMIN (GLUCOPHAGE) 1000 MG tablet Take 1,000 mg by mouth 2 (two) times daily with a meal.    . NOVOLOG FLEXPEN 100 UNIT/ML FlexPen Inject 20 Units as directed 2 (two) times daily.   11  . tamsulosin (FLOMAX) 0.4 MG CAPS capsule Take 0.4 mg by mouth daily. Patient states he is taking it twice daily    . TOUJEO SOLOSTAR 300 UNIT/ML Solostar Pen     . VITAMIN D PO Take by mouth. Twice a day pr patient    . albuterol (VENTOLIN HFA) 108 (90 Base) MCG/ACT inhaler  (Patient not taking: Reported on 09/13/2020)    . ARNUITY ELLIPTA 100 MCG/ACT AEPB  (Patient not taking: Reported on 09/13/2020)    . tobramycin-dexamethasone (TOBRADEX) ophthalmic solution  (Patient not taking: Reported on 09/13/2020)  No current facility-administered medications for this encounter.    Physical Findings:  vitals were not taken for this visit.   /Unable to assess due to telephone follow up visit.  Lab Findings: Lab Results  Component Value Date   WBC 12.5 (H) 07/25/2017   HGB 12.1 (L) 07/25/2017   HCT 38.1 (L) 07/25/2017   MCV 89.2 07/25/2017   PLT 271 07/25/2017     Radiographic Findings: CT CHEST WO CONTRAST  Result Date: 08/28/2020 CLINICAL DATA:  Right lower lobe pulmonary nodule seen on cardiac CT EXAM: CT CHEST WITHOUT CONTRAST TECHNIQUE: Multidetector CT imaging of the chest was performed following the standard protocol without IV contrast. COMPARISON:  01/12/2020 FINDINGS: Cardiovascular: Unenhanced imaging of  the heart and great vessels demonstrates no pericardial effusion. Normal caliber of the thoracic aorta. Mild atherosclerosis of the aorta and coronary vessels. Mediastinum/Nodes: No enlarged mediastinal or axillary lymph nodes. Thyroid gland, trachea, and esophagus demonstrate no significant findings. Lungs/Pleura: There is a 6 x 5 mm right lower lobe pulmonary nodule image 105/5, unchanged since prior exam. Chronic areas of pleural and parenchymal scarring are seen within the right hemithorax, greatest at the right base. No acute airspace disease, effusion, or pneumothorax. The central airways are patent. Upper Abdomen: No acute abnormality. Musculoskeletal: No acute or destructive bony lesions. Stable sclerotic focus within the T9 vertebral body consistent with bone island. Reconstructed images demonstrate no additional findings. IMPRESSION: 1. Stable 6 mm mean diameter right lower lobe pulmonary nodule. Non-contrast chest CT at 12-18 months from today's exam is considered optional for low-risk patients, but is recommended for high-risk patients. This recommendation follows the consensus statement: Guidelines for Management of Incidental Pulmonary Nodules Detected on CT Images: From the Fleischner Society 2017; Radiology 2017; 284:228-243. 2. Stable pleural and parenchymal scarring at the right lung base. 3.  Aortic Atherosclerosis (ICD10-I70.0). Electronically Signed   By: Randa Ngo M.D.   On: 08/28/2020 15:08    Impression/Plan: 1. 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 He will continue to follow up with urology for ongoing PSA determinations but does not currently have an appointment scheduled with Dr. Jeffie [name] to his knowledge.  He complications beginning tomorrow and return around 10/01/2020.  If he has not heard from staff member at [facility] urology prior to his return regarding scheduling his follow-up visit, he will call the office  and get this scheduled once he is back from vacation.  He understands what to expect with regards to PSA monitoring going forward. I will look forward to following his response to treatment via correspondence with urology, and would be happy to continue to participate in his care if clinically indicated. I talked to the patient about what to expect in the future, including his risk for erectile dysfunction and rectal bleeding. I encouraged him to call or return to the office if he has any questions regarding his previous radiation or possible radiation side effects. He was comfortable with this plan and will follow up as needed.    Nicholos Johns, PA-C

## 2020-10-17 DIAGNOSIS — E1129 Type 2 diabetes mellitus with other diabetic kidney complication: Secondary | ICD-10-CM | POA: Diagnosis not present

## 2020-10-17 DIAGNOSIS — E785 Hyperlipidemia, unspecified: Secondary | ICD-10-CM | POA: Diagnosis not present

## 2020-10-17 DIAGNOSIS — I10 Essential (primary) hypertension: Secondary | ICD-10-CM | POA: Diagnosis not present

## 2020-10-17 DIAGNOSIS — Z794 Long term (current) use of insulin: Secondary | ICD-10-CM | POA: Diagnosis not present

## 2020-10-22 DIAGNOSIS — E119 Type 2 diabetes mellitus without complications: Secondary | ICD-10-CM | POA: Diagnosis not present

## 2020-12-07 DIAGNOSIS — N403 Nodular prostate with lower urinary tract symptoms: Secondary | ICD-10-CM | POA: Diagnosis not present

## 2020-12-07 DIAGNOSIS — R3912 Poor urinary stream: Secondary | ICD-10-CM | POA: Diagnosis not present

## 2020-12-07 DIAGNOSIS — C61 Malignant neoplasm of prostate: Secondary | ICD-10-CM | POA: Diagnosis not present

## 2020-12-31 DIAGNOSIS — R059 Cough, unspecified: Secondary | ICD-10-CM | POA: Diagnosis not present

## 2020-12-31 DIAGNOSIS — Z20822 Contact with and (suspected) exposure to covid-19: Secondary | ICD-10-CM | POA: Diagnosis not present

## 2021-01-10 DIAGNOSIS — M8589 Other specified disorders of bone density and structure, multiple sites: Secondary | ICD-10-CM | POA: Diagnosis not present

## 2021-01-10 DIAGNOSIS — Z125 Encounter for screening for malignant neoplasm of prostate: Secondary | ICD-10-CM | POA: Diagnosis not present

## 2021-01-10 DIAGNOSIS — E785 Hyperlipidemia, unspecified: Secondary | ICD-10-CM | POA: Diagnosis not present

## 2021-01-10 DIAGNOSIS — E1165 Type 2 diabetes mellitus with hyperglycemia: Secondary | ICD-10-CM | POA: Diagnosis not present

## 2021-01-10 DIAGNOSIS — E291 Testicular hypofunction: Secondary | ICD-10-CM | POA: Diagnosis not present

## 2021-01-17 DIAGNOSIS — Z0000 Encounter for general adult medical examination without abnormal findings: Secondary | ICD-10-CM | POA: Diagnosis not present

## 2021-01-17 DIAGNOSIS — I6529 Occlusion and stenosis of unspecified carotid artery: Secondary | ICD-10-CM | POA: Diagnosis not present

## 2021-01-17 DIAGNOSIS — R82998 Other abnormal findings in urine: Secondary | ICD-10-CM | POA: Diagnosis not present

## 2021-01-17 DIAGNOSIS — R911 Solitary pulmonary nodule: Secondary | ICD-10-CM | POA: Diagnosis not present

## 2021-01-17 DIAGNOSIS — R918 Other nonspecific abnormal finding of lung field: Secondary | ICD-10-CM | POA: Diagnosis not present

## 2021-01-17 DIAGNOSIS — Z1389 Encounter for screening for other disorder: Secondary | ICD-10-CM | POA: Diagnosis not present

## 2021-01-17 DIAGNOSIS — Z1331 Encounter for screening for depression: Secondary | ICD-10-CM | POA: Diagnosis not present

## 2021-01-17 DIAGNOSIS — I10 Essential (primary) hypertension: Secondary | ICD-10-CM | POA: Diagnosis not present

## 2021-01-17 DIAGNOSIS — Z23 Encounter for immunization: Secondary | ICD-10-CM | POA: Diagnosis not present

## 2021-01-17 DIAGNOSIS — Z794 Long term (current) use of insulin: Secondary | ICD-10-CM | POA: Diagnosis not present

## 2021-01-17 DIAGNOSIS — R972 Elevated prostate specific antigen [PSA]: Secondary | ICD-10-CM | POA: Diagnosis not present

## 2021-01-17 DIAGNOSIS — I700 Atherosclerosis of aorta: Secondary | ICD-10-CM | POA: Diagnosis not present

## 2021-01-17 DIAGNOSIS — M8580 Other specified disorders of bone density and structure, unspecified site: Secondary | ICD-10-CM | POA: Diagnosis not present

## 2021-01-17 DIAGNOSIS — E1129 Type 2 diabetes mellitus with other diabetic kidney complication: Secondary | ICD-10-CM | POA: Diagnosis not present

## 2021-01-17 DIAGNOSIS — E785 Hyperlipidemia, unspecified: Secondary | ICD-10-CM | POA: Diagnosis not present

## 2021-01-17 DIAGNOSIS — I272 Pulmonary hypertension, unspecified: Secondary | ICD-10-CM | POA: Diagnosis not present

## 2021-01-18 DIAGNOSIS — Z23 Encounter for immunization: Secondary | ICD-10-CM | POA: Diagnosis not present

## 2021-01-21 ENCOUNTER — Other Ambulatory Visit (HOSPITAL_COMMUNITY): Payer: Self-pay | Admitting: Internal Medicine

## 2021-01-21 DIAGNOSIS — I6529 Occlusion and stenosis of unspecified carotid artery: Secondary | ICD-10-CM

## 2021-01-22 ENCOUNTER — Ambulatory Visit (HOSPITAL_COMMUNITY)
Admission: RE | Admit: 2021-01-22 | Discharge: 2021-01-22 | Disposition: A | Discharge status: Home / Self Care | Payer: Medicare Other | Source: Ambulatory Visit | Attending: Internal Medicine | Admitting: Internal Medicine

## 2021-01-22 ENCOUNTER — Other Ambulatory Visit: Payer: Self-pay

## 2021-01-22 DIAGNOSIS — I6529 Occlusion and stenosis of unspecified carotid artery: Secondary | ICD-10-CM

## 2021-01-23 DIAGNOSIS — E1129 Type 2 diabetes mellitus with other diabetic kidney complication: Secondary | ICD-10-CM | POA: Diagnosis not present

## 2021-01-23 DIAGNOSIS — I10 Essential (primary) hypertension: Secondary | ICD-10-CM | POA: Diagnosis not present

## 2021-01-23 DIAGNOSIS — E785 Hyperlipidemia, unspecified: Secondary | ICD-10-CM | POA: Diagnosis not present

## 2021-01-23 DIAGNOSIS — Z1212 Encounter for screening for malignant neoplasm of rectum: Secondary | ICD-10-CM | POA: Diagnosis not present

## 2021-01-23 DIAGNOSIS — Z794 Long term (current) use of insulin: Secondary | ICD-10-CM | POA: Diagnosis not present

## 2021-02-17 IMAGING — CT CT CARDIAC CORONARY ARTERY CALCIUM SCORE
3 series · 14 of 20 positions shown, 16 images · non-contrast
Comparison: Chest CT 02/27/2017.

CLINICAL DATA: 68-year-old Caucasian male with elevated
cholesterol.

EXAM:
CT CARDIAC CORONARY ARTERY CALCIUM SCORE
TECHNIQUE: Non-contrast imaging through the heart was performed using
prospective ECG gating. Image post processing was performed on an
independent workstation, allowing for quantitative analysis of the
heart and coronary arteries. Note that this exam targets the heart
and the chest was not imaged in its entirety.

[Series 2: calcium scoring 2.00 qr36 bestdiast 70% hrt calciu · axial · 0.46mm/px · z∈[+1517,+1601]mm · 4 of 70 slices shown]
[im 14/70  vessel]
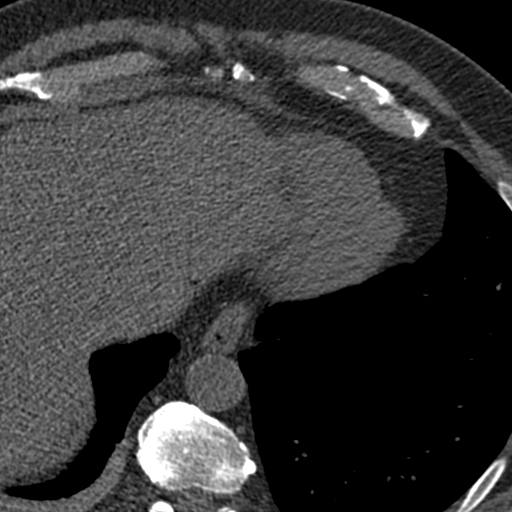
[im 28/70  vessel]
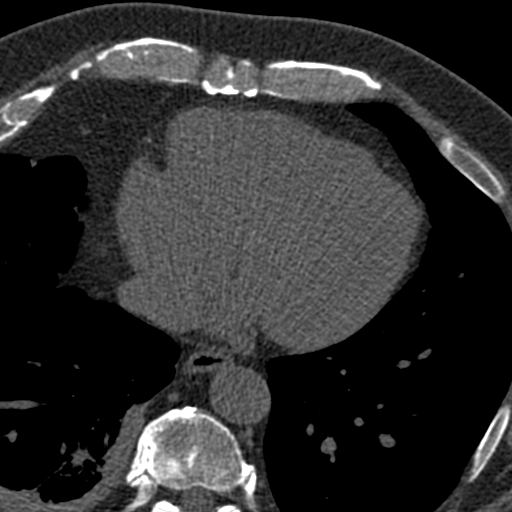
[im 42/70  vessel]
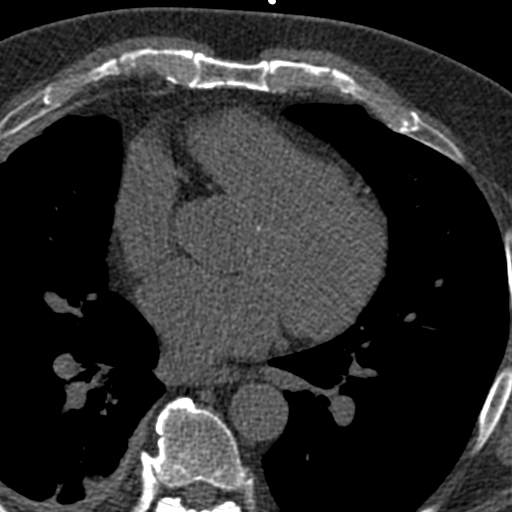
[im 56/70  vessel]
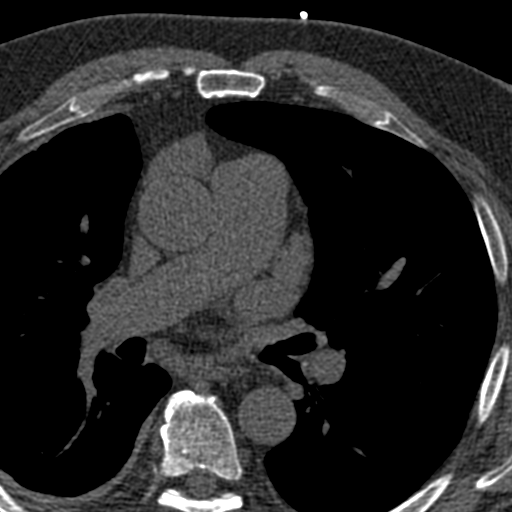

[Series 3: calcium scoring 2.00 br40 bestdiast 70% axial · axial · 0.65mm/px · z∈[+1513,+1605]mm · 5 of 70 slices shown, 7 images]
[im 12/70  vessel]
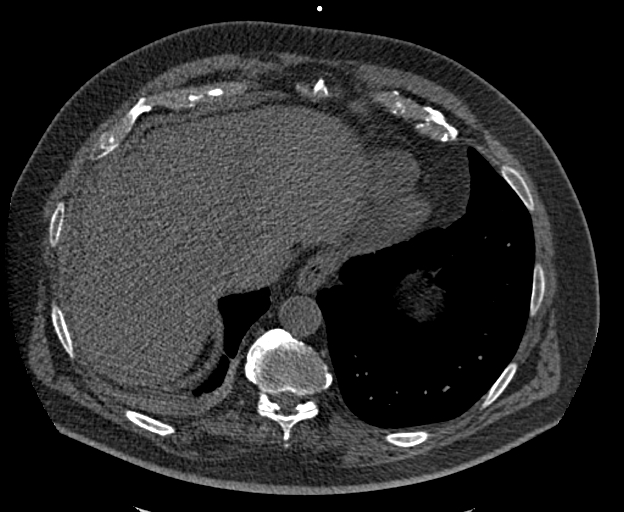
[im 12/70  lung]
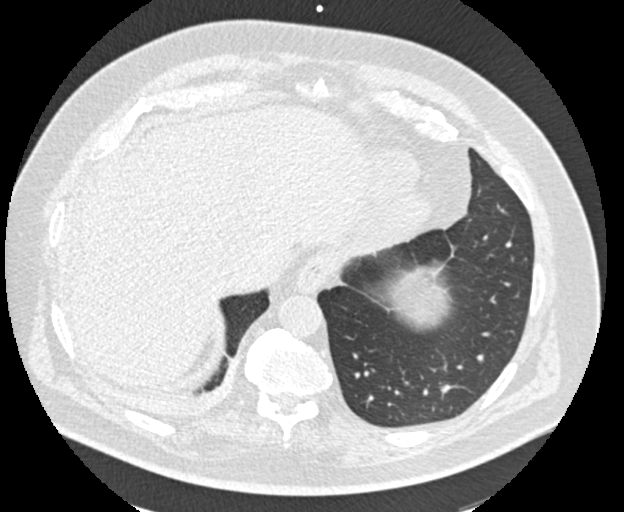
[im 24/70  vessel]
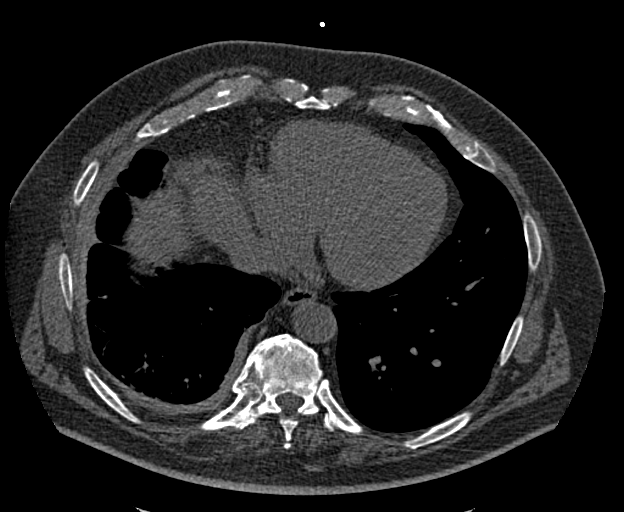
[im 35/70  vessel]
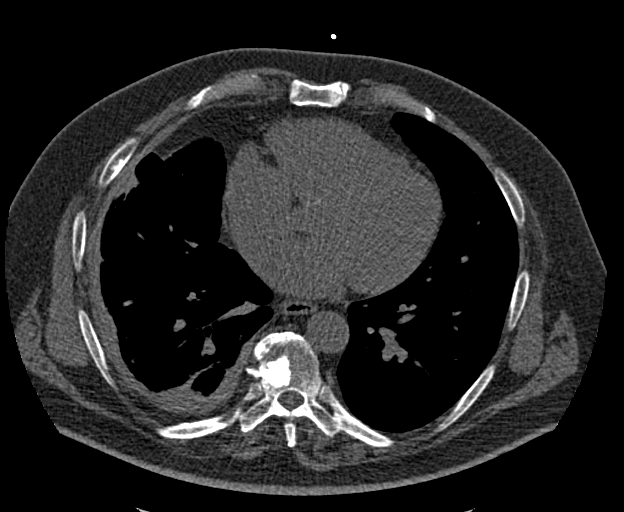
[im 47/70  vessel]
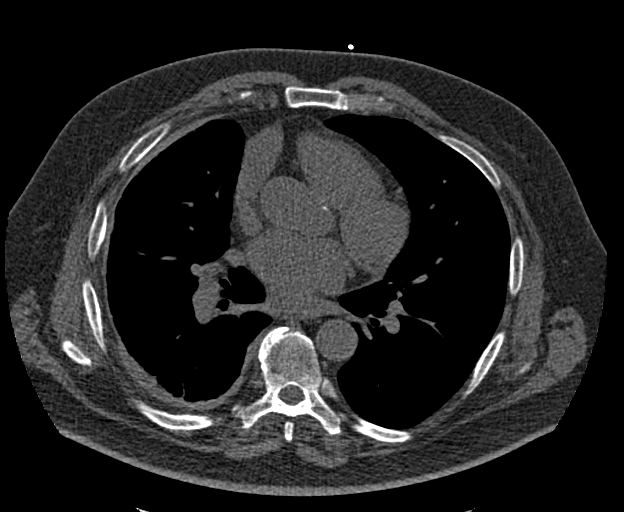
[im 58/70  vessel]
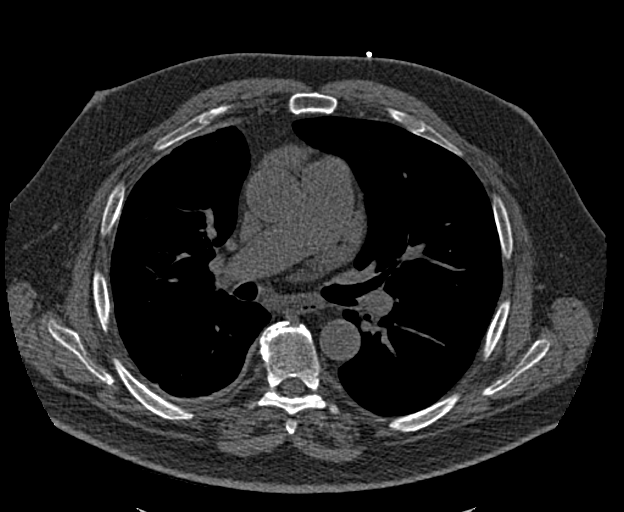
[im 58/70  lung]
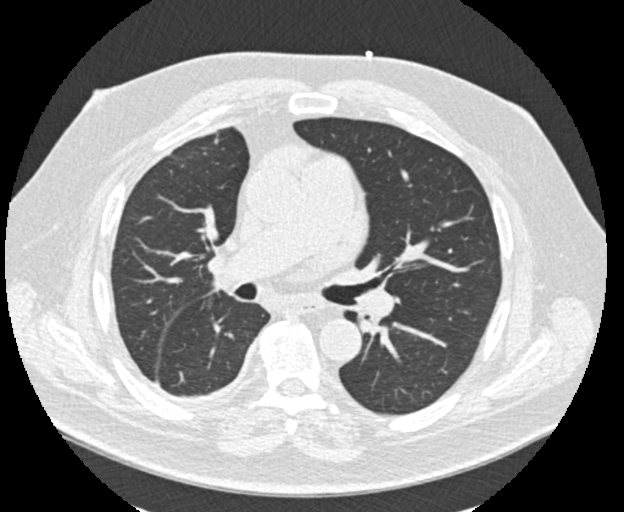

[Series 9: calcium scoring 2.00 br60 bestdiast 70% lungs · axial · 0.65mm/px · z∈[+1513,+1605]mm · 5 of 70 slices shown]
[im 12/70  vessel]
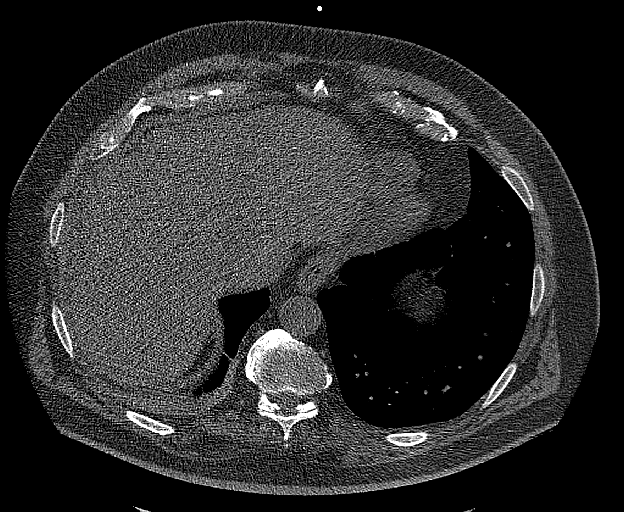
[im 24/70  vessel]
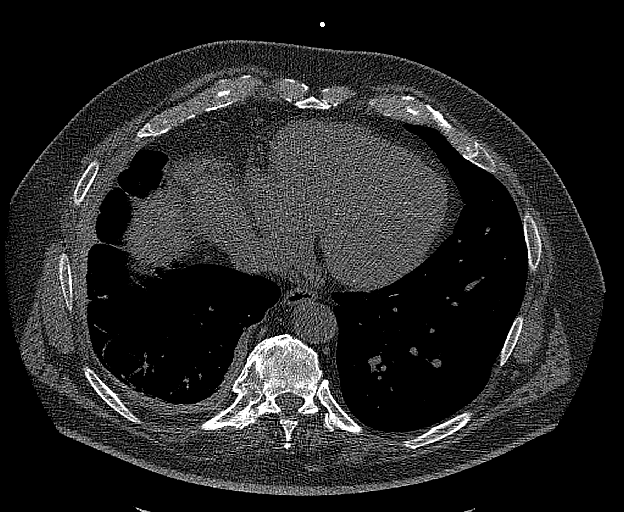
[im 35/70  vessel]
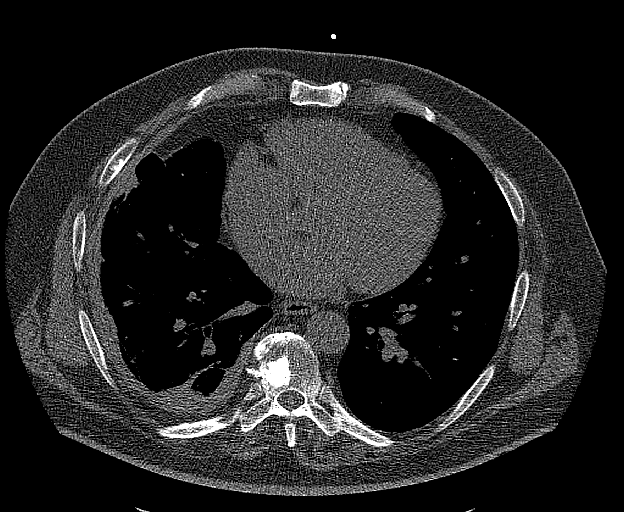
[im 47/70  vessel]
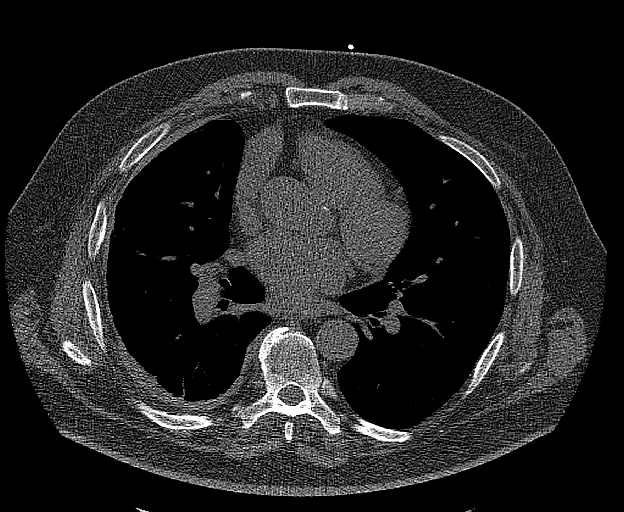
[im 58/70  vessel]
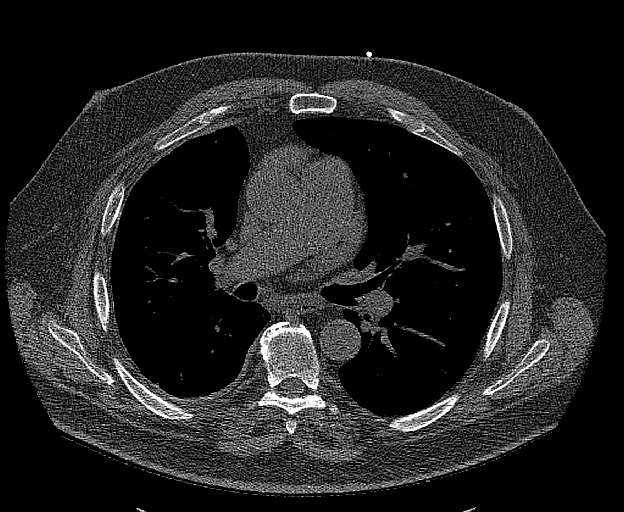

[14 of 20 positions shown; findings below may reference images not displayed]

FINDINGS: CORONARY CALCIUM SCORES:

Left Main: 0

LAD: 93

LCx: 0

RCA: 42

Total Agatston Score: 135

[HOSPITAL] percentile: 48th

AORTA MEASUREMENTS:

Ascending Aorta: 35 mm

Descending Aorta: 26 mm

OTHER FINDINGS:

Mild calcifications of the aortic valve. Aortic atherosclerosis.
Extensive pleuroparenchymal thickening and architectural distortion
in the right hemithorax. 7 x 5 mm (mean diameter of 6 mm) right
lower lobe pulmonary nodule (axial image 48 of series 9), new
compared to the prior examination. 4 mm right upper lobe nodule
(axial image 12 of series 9), stable compared to the prior
examination, considered definitively benign. Within the visualized
portions of the thorax there are no suspicious appearing pulmonary
nodules or masses, there is no acute consolidative airspace disease,
no pleural effusions, no pneumothorax and no lymphadenopathy.
Visualized portions of the upper abdomen are unremarkable. T9
sclerotic lesion with narrow zone of transition, stable compared to
the prior study, most compatible with a bone island. There are no
other aggressive appearing lytic or blastic lesions noted in the
visualized portions of the skeleton.
IMPRESSION: 1. New right lower lobe pulmonary nodule with a mean diameter of 6
mm (axial image 48 of series 9). Non-contrast chest CT at 6-12
months is recommended. If the nodule is stable at time of repeat CT,
then future CT at 18-24 months (from today's scan) is considered
optional for low-risk patients, but is recommended for high-risk
patients. This recommendation follows the consensus statement:
Guidelines for Management of Incidental Pulmonary Nodules Detected
[DATE].
2. There are calcifications of the aortic valve. Echocardiographic
correlation for evaluation of potential valvular dysfunction may be
warranted if clinically indicated.
3.  Aortic Atherosclerosis ([id number]).
4. Chronic pleuroparenchymal scarring in the right hemithorax,
stable compared to prior examinations.

## 2021-02-19 DIAGNOSIS — C61 Malignant neoplasm of prostate: Secondary | ICD-10-CM | POA: Diagnosis not present

## 2021-02-27 DIAGNOSIS — R351 Nocturia: Secondary | ICD-10-CM | POA: Diagnosis not present

## 2021-02-27 DIAGNOSIS — C61 Malignant neoplasm of prostate: Secondary | ICD-10-CM | POA: Diagnosis not present

## 2021-02-27 DIAGNOSIS — N403 Nodular prostate with lower urinary tract symptoms: Secondary | ICD-10-CM | POA: Diagnosis not present

## 2021-02-27 DIAGNOSIS — R3912 Poor urinary stream: Secondary | ICD-10-CM | POA: Diagnosis not present

## 2021-04-23 DIAGNOSIS — J069 Acute upper respiratory infection, unspecified: Secondary | ICD-10-CM | POA: Diagnosis not present

## 2021-04-23 DIAGNOSIS — Z1152 Encounter for screening for COVID-19: Secondary | ICD-10-CM | POA: Diagnosis not present

## 2021-04-23 DIAGNOSIS — R051 Acute cough: Secondary | ICD-10-CM | POA: Diagnosis not present

## 2021-04-23 DIAGNOSIS — R5383 Other fatigue: Secondary | ICD-10-CM | POA: Diagnosis not present

## 2021-04-23 DIAGNOSIS — J029 Acute pharyngitis, unspecified: Secondary | ICD-10-CM | POA: Diagnosis not present

## 2021-04-24 DIAGNOSIS — I10 Essential (primary) hypertension: Secondary | ICD-10-CM | POA: Diagnosis not present

## 2021-04-24 DIAGNOSIS — Z794 Long term (current) use of insulin: Secondary | ICD-10-CM | POA: Diagnosis not present

## 2021-04-24 DIAGNOSIS — E785 Hyperlipidemia, unspecified: Secondary | ICD-10-CM | POA: Diagnosis not present

## 2021-04-24 DIAGNOSIS — E1129 Type 2 diabetes mellitus with other diabetic kidney complication: Secondary | ICD-10-CM | POA: Diagnosis not present

## 2021-07-17 DIAGNOSIS — I10 Essential (primary) hypertension: Secondary | ICD-10-CM | POA: Diagnosis not present

## 2021-07-17 DIAGNOSIS — E785 Hyperlipidemia, unspecified: Secondary | ICD-10-CM | POA: Diagnosis not present

## 2021-07-17 DIAGNOSIS — E1129 Type 2 diabetes mellitus with other diabetic kidney complication: Secondary | ICD-10-CM | POA: Diagnosis not present

## 2021-07-17 DIAGNOSIS — Z794 Long term (current) use of insulin: Secondary | ICD-10-CM | POA: Diagnosis not present

## 2021-07-24 DIAGNOSIS — I10 Essential (primary) hypertension: Secondary | ICD-10-CM | POA: Diagnosis not present

## 2021-07-24 DIAGNOSIS — D72829 Elevated white blood cell count, unspecified: Secondary | ICD-10-CM | POA: Diagnosis not present

## 2021-07-24 DIAGNOSIS — G4733 Obstructive sleep apnea (adult) (pediatric): Secondary | ICD-10-CM | POA: Diagnosis not present

## 2021-07-24 DIAGNOSIS — Z794 Long term (current) use of insulin: Secondary | ICD-10-CM | POA: Diagnosis not present

## 2021-07-24 DIAGNOSIS — E1129 Type 2 diabetes mellitus with other diabetic kidney complication: Secondary | ICD-10-CM | POA: Diagnosis not present

## 2021-07-24 DIAGNOSIS — I272 Pulmonary hypertension, unspecified: Secondary | ICD-10-CM | POA: Diagnosis not present

## 2021-07-24 DIAGNOSIS — M5412 Radiculopathy, cervical region: Secondary | ICD-10-CM | POA: Diagnosis not present

## 2021-07-24 DIAGNOSIS — R911 Solitary pulmonary nodule: Secondary | ICD-10-CM | POA: Diagnosis not present

## 2021-07-24 DIAGNOSIS — E1165 Type 2 diabetes mellitus with hyperglycemia: Secondary | ICD-10-CM | POA: Diagnosis not present

## 2021-07-24 DIAGNOSIS — E785 Hyperlipidemia, unspecified: Secondary | ICD-10-CM | POA: Diagnosis not present

## 2021-07-24 DIAGNOSIS — D649 Anemia, unspecified: Secondary | ICD-10-CM | POA: Diagnosis not present

## 2021-07-24 DIAGNOSIS — I700 Atherosclerosis of aorta: Secondary | ICD-10-CM | POA: Diagnosis not present

## 2021-07-25 ENCOUNTER — Other Ambulatory Visit: Payer: Self-pay | Admitting: Internal Medicine

## 2021-09-03 ENCOUNTER — Other Ambulatory Visit: Payer: Medicare Other

## 2021-09-04 DIAGNOSIS — C61 Malignant neoplasm of prostate: Secondary | ICD-10-CM | POA: Diagnosis not present

## 2021-09-11 DIAGNOSIS — N403 Nodular prostate with lower urinary tract symptoms: Secondary | ICD-10-CM | POA: Diagnosis not present

## 2021-09-11 DIAGNOSIS — C61 Malignant neoplasm of prostate: Secondary | ICD-10-CM | POA: Diagnosis not present

## 2021-09-11 DIAGNOSIS — R351 Nocturia: Secondary | ICD-10-CM | POA: Diagnosis not present

## 2021-09-17 DIAGNOSIS — Z23 Encounter for immunization: Secondary | ICD-10-CM | POA: Diagnosis not present

## 2021-09-24 ENCOUNTER — Ambulatory Visit
Admission: RE | Admit: 2021-09-24 | Discharge: 2021-09-24 | Disposition: A | Discharge status: Home / Self Care | Payer: Medicare Other | Source: Ambulatory Visit | Attending: Internal Medicine | Admitting: Internal Medicine

## 2021-09-24 DIAGNOSIS — R918 Other nonspecific abnormal finding of lung field: Secondary | ICD-10-CM | POA: Diagnosis not present

## 2021-09-24 DIAGNOSIS — R911 Solitary pulmonary nodule: Secondary | ICD-10-CM

## 2021-10-17 DIAGNOSIS — E785 Hyperlipidemia, unspecified: Secondary | ICD-10-CM | POA: Diagnosis not present

## 2021-10-17 DIAGNOSIS — Z794 Long term (current) use of insulin: Secondary | ICD-10-CM | POA: Diagnosis not present

## 2021-10-17 DIAGNOSIS — E1165 Type 2 diabetes mellitus with hyperglycemia: Secondary | ICD-10-CM | POA: Diagnosis not present

## 2021-10-17 DIAGNOSIS — I10 Essential (primary) hypertension: Secondary | ICD-10-CM | POA: Diagnosis not present

## 2021-10-30 DIAGNOSIS — E119 Type 2 diabetes mellitus without complications: Secondary | ICD-10-CM | POA: Diagnosis not present

## 2022-01-26 DIAGNOSIS — Z23 Encounter for immunization: Secondary | ICD-10-CM | POA: Diagnosis not present

## 2022-01-28 DIAGNOSIS — E1129 Type 2 diabetes mellitus with other diabetic kidney complication: Secondary | ICD-10-CM | POA: Diagnosis not present

## 2022-01-28 DIAGNOSIS — R7989 Other specified abnormal findings of blood chemistry: Secondary | ICD-10-CM | POA: Diagnosis not present

## 2022-01-28 DIAGNOSIS — E876 Hypokalemia: Secondary | ICD-10-CM | POA: Diagnosis not present

## 2022-01-28 DIAGNOSIS — Z125 Encounter for screening for malignant neoplasm of prostate: Secondary | ICD-10-CM | POA: Diagnosis not present

## 2022-01-28 DIAGNOSIS — M8580 Other specified disorders of bone density and structure, unspecified site: Secondary | ICD-10-CM | POA: Diagnosis not present

## 2022-01-28 DIAGNOSIS — E291 Testicular hypofunction: Secondary | ICD-10-CM | POA: Diagnosis not present

## 2022-01-28 DIAGNOSIS — M859 Disorder of bone density and structure, unspecified: Secondary | ICD-10-CM | POA: Diagnosis not present

## 2022-01-28 DIAGNOSIS — M8589 Other specified disorders of bone density and structure, multiple sites: Secondary | ICD-10-CM | POA: Diagnosis not present

## 2022-01-28 DIAGNOSIS — E785 Hyperlipidemia, unspecified: Secondary | ICD-10-CM | POA: Diagnosis not present

## 2022-01-28 DIAGNOSIS — D649 Anemia, unspecified: Secondary | ICD-10-CM | POA: Diagnosis not present

## 2022-01-28 DIAGNOSIS — I10 Essential (primary) hypertension: Secondary | ICD-10-CM | POA: Diagnosis not present

## 2022-01-28 DIAGNOSIS — Z1212 Encounter for screening for malignant neoplasm of rectum: Secondary | ICD-10-CM | POA: Diagnosis not present

## 2022-02-03 DIAGNOSIS — Z23 Encounter for immunization: Secondary | ICD-10-CM | POA: Diagnosis not present

## 2022-02-07 DIAGNOSIS — E785 Hyperlipidemia, unspecified: Secondary | ICD-10-CM | POA: Diagnosis not present

## 2022-02-07 DIAGNOSIS — Z794 Long term (current) use of insulin: Secondary | ICD-10-CM | POA: Diagnosis not present

## 2022-02-07 DIAGNOSIS — I10 Essential (primary) hypertension: Secondary | ICD-10-CM | POA: Diagnosis not present

## 2022-02-07 DIAGNOSIS — I272 Pulmonary hypertension, unspecified: Secondary | ICD-10-CM | POA: Diagnosis not present

## 2022-02-07 DIAGNOSIS — R82998 Other abnormal findings in urine: Secondary | ICD-10-CM | POA: Diagnosis not present

## 2022-02-07 DIAGNOSIS — M8580 Other specified disorders of bone density and structure, unspecified site: Secondary | ICD-10-CM | POA: Diagnosis not present

## 2022-02-07 DIAGNOSIS — I700 Atherosclerosis of aorta: Secondary | ICD-10-CM | POA: Diagnosis not present

## 2022-02-07 DIAGNOSIS — E1165 Type 2 diabetes mellitus with hyperglycemia: Secondary | ICD-10-CM | POA: Diagnosis not present

## 2022-02-07 DIAGNOSIS — Z0000 Encounter for general adult medical examination without abnormal findings: Secondary | ICD-10-CM | POA: Diagnosis not present

## 2022-02-07 DIAGNOSIS — E1129 Type 2 diabetes mellitus with other diabetic kidney complication: Secondary | ICD-10-CM | POA: Diagnosis not present

## 2022-02-07 DIAGNOSIS — R809 Proteinuria, unspecified: Secondary | ICD-10-CM | POA: Diagnosis not present

## 2022-02-07 DIAGNOSIS — R918 Other nonspecific abnormal finding of lung field: Secondary | ICD-10-CM | POA: Diagnosis not present

## 2022-02-07 DIAGNOSIS — Z1331 Encounter for screening for depression: Secondary | ICD-10-CM | POA: Diagnosis not present

## 2022-02-17 ENCOUNTER — Other Ambulatory Visit (HOSPITAL_COMMUNITY): Payer: Self-pay | Admitting: *Deleted

## 2022-02-18 ENCOUNTER — Ambulatory Visit (HOSPITAL_COMMUNITY)
Admission: RE | Admit: 2022-02-18 | Discharge: 2022-02-18 | Disposition: A | Discharge status: Home / Self Care | Payer: Medicare Other | Source: Ambulatory Visit | Attending: Internal Medicine | Admitting: Internal Medicine

## 2022-02-18 DIAGNOSIS — M810 Age-related osteoporosis without current pathological fracture: Secondary | ICD-10-CM | POA: Insufficient documentation

## 2022-02-18 MED ORDER — DENOSUMAB 60 MG/ML ~~LOC~~ SOSY
60.0000 mg | PREFILLED_SYRINGE | Freq: Once | SUBCUTANEOUS | Status: AC
  Administered 2022-02-18: 60 mg via SUBCUTANEOUS

## 2022-02-18 MED ORDER — DENOSUMAB 60 MG/ML ~~LOC~~ SOSY
PREFILLED_SYRINGE | SUBCUTANEOUS | Status: AC
  Filled 2022-02-18: qty 1

## 2022-03-05 DIAGNOSIS — C61 Malignant neoplasm of prostate: Secondary | ICD-10-CM | POA: Diagnosis not present

## 2022-03-11 ENCOUNTER — Ambulatory Visit: Payer: Medicare Other | Attending: Cardiology | Admitting: Cardiology

## 2022-03-11 ENCOUNTER — Encounter: Payer: Self-pay | Admitting: Cardiology

## 2022-03-11 VITALS — BP 126/62 | HR 53 | Ht 71.0 in | Wt 223.1 lb

## 2022-03-11 DIAGNOSIS — I10 Essential (primary) hypertension: Secondary | ICD-10-CM | POA: Diagnosis not present

## 2022-03-11 DIAGNOSIS — E119 Type 2 diabetes mellitus without complications: Secondary | ICD-10-CM | POA: Diagnosis not present

## 2022-03-11 DIAGNOSIS — E785 Hyperlipidemia, unspecified: Secondary | ICD-10-CM

## 2022-03-11 NOTE — Progress Notes (Signed)
Cardiology Office Note:    Date:  03/11/2022   ID:  Kenneth [name], DOB 1950-06-17, MRN [id number]  PCP:  Crist Infante, MD  Cardiologist:  Candee Furbish, MD  Electrophysiologist:  None   Referring MD: Crist Infante, MD    History of Present Illness:    Kenneth [name] is a 71 y.o. male here for the follow-up of diabetes and hypertension.   Had stage T2a adenocarcinoma of the prostate with Gleason Score of 4+3, and PSA of 6.15. Beginning 07/12/2020 he proceeded with prostate IMRT. Final treatment was 08/21/2020.  Prior echocardiogram 07/24/17 showed there was mild concentric hypertrophy and LVEF 60% to 65%. Left ventricular diastolic function parameters were normal. Systolic pressure was moderately increased. PA peak pressure: 43 mm Hg (S).   At his last appointment he was doing well. 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 His blood pressure was 126/60. On atorvastatin 40 mg, LDL 68, no myalgias.  Today: He has no major complaints today. He denies issues with chest pain, shortness of breath, or syncope.  For exercise he is walking 3 miles a day and feeling good afterwards.  Typically he only has stress during tax season, but he states this is nothing he is unable to handle. Every day he plays on his grand piano for at least a half hour which is very relaxing for him. Popular music, show tunes, Chrisandra Carota, etc.  Additionally he has made changes to his diet. He has completely cut out salt and no longer uses Sweet'N Low packets.  He denies any palpitations, chest pain, shortness of breath, or peripheral edema. No lightheadedness, headaches, syncope, orthopnea, or PND.   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   History of colon polyps - adenomas 04/03/2010   Hyperlipidemia    Hypertension    OSA (obstructive sleep apnea)    refusing CPAP   Prostate cancer [facility])     Past  Surgical History:  Procedure Laterality Date   ANTERIOR CERVICAL DECOMP/DISCECTOMY FUSION N/A 10/01/2012   Procedure: ANTERIOR CERVICAL DECOMPRESSION/DISCECTOMY FUSION 2 LEVELS;  Surgeon: Eustace Moore, MD;  Location: [facility];  Service: Neurosurgery;  Laterality: N/A;  Cervical five-six,Cervical six-seven   COLONOSCOPY  09/30/2010, 03/01/2014   diverticulosis, small internal hemorrhoids   COLONOSCOPY W/ POLYPECTOMY  04/03/2010   4 polyps 8-59m, worst TV adenoma with high-grade dysplasia   NO PAST SURGERIES     POLYPECTOMY     POSTERIOR CERVICAL FUSION/FORAMINOTOMY Left 10/20/2012   Procedure: C/5-6,C/6-7 [place]/Multi level,POSTERIOR CERVICAL FUSION/FORAMINOTOMY C/4-7;  Surgeon: DEustace Moore MD;  Location: [facility];  Service: Neurosurgery;  Laterality: Left;  Cervical Five-Six, Cervical Six-Seven Laminectomies, Posterior Cervical Fusion/Foraminotomies Cervical Four through Seven.    TONSILLECTOMY      Current Medications: Current Meds  Medication Sig   acetaminophen (TYLENOL) 500 MG tablet Take 500 mg by mouth daily as needed for headache.   aspirin 81 MG tablet Take 81 mg by mouth daily.   atorvastatin (LIPITOR) 40 MG tablet Take 40 mg by mouth daily.   B-D UF III MINI PEN NEEDLES 31G X 5 MM MISC Inject 1 Stick as directed as directed.   benazepril (LOTENSIN) 40 MG tablet Take 1 tablet (40 mg total) by mouth daily. Please schedule appt for future refills. 1st attempt   chlorthalidone (HYGROTON) 25 MG tablet Take  25 mg by mouth daily.   ezetimibe (ZETIA) 10 MG tablet    metFORMIN (GLUCOPHAGE) 1000 MG tablet Take 1,000 mg by mouth 2 (two) times daily with a meal.   tamsulosin (FLOMAX) 0.4 MG CAPS capsule Take 0.4 mg by mouth daily. Patient states he is taking it twice daily   TOUJEO SOLOSTAR 300 UNIT/ML Solostar Pen    VITAMIN D PO Take by mouth. Twice a day pr patient     Allergies:   Penicillins   Social History   Socioeconomic History   Marital status: Married    Spouse  name: Not on file   Number of children: Not on file   Years of education: Not on file   Highest education level: Not on file  Occupational History   Not on file  Tobacco Use   Smoking status: Former    Packs/day: 0.25    Years: 2.00    Total pack years: 0.50    Types: Cigarettes    Quit date: 09/22/1969    Years since quitting: 52.5   Smokeless tobacco: Never  Vaping Use   Vaping Use: Never used  Substance and Sexual Activity   Alcohol use: No   Drug use: No   Sexual activity: Yes  Other Topics Concern   Not on file  Social History Narrative   Not on file   Social Determinants of Health   Financial Resource Strain: Not on file  Food Insecurity: Not on file  Transportation Needs: Not on file  Physical Activity: Not on file  Stress: Not on file  Social Connections: Not on file     Family History: The patient's family history includes Diabetes in his mother. There is no history of Stomach cancer, Rectal cancer, Pancreatic cancer, Colon cancer, Colon polyps, Esophageal cancer, Prostate cancer, or Breast cancer.  ROS:   Please see the history of present illness.    All other systems reviewed and are negative.  EKGs/Labs/Other Studies Reviewed:    The following studies were reviewed today:  CT Chest  09/24/2021: FINDINGS: Cardiovascular: Heart is normal in size. Aortic valve calcifications. Predominately LEFT-sided coronary artery atherosclerotic calcifications. No pericardial effusion. Scattered atherosclerotic calcifications of the nonaneurysmal thoracic aorta.  IMPRESSION: 1. Stable pulmonary nodules dating back to 2018, consistent with a benign etiology. No dedicated follow-up is indicated for these benign nodules. Additional previously described 6 mm pulmonary nodule the RIGHT lower lobe is no longer discretely visualized. 2. Revisualization of pleuroparenchymal scarring of the RIGHT lung. 3. Similar appearance of a 19 mm RIGHT thyroid nodule since 2014, most  consistent with a benign etiology. This could be assessed with dedicated thyroid ultrasound if not previously assessed.   Aortic Atherosclerosis (ICD10-I70.0).  Bilateral Carotid Doppler  01/22/2021: Summary:  Right Carotid: Velocities in the right ICA are consistent with a 1-39%  stenosis.   Left Carotid: Velocities in the left ICA are consistent with a 1-39%  stenosis.   Vertebrals:  Bilateral vertebral arteries demonstrate antegrade flow.  Subclavians: Normal flow hemodynamics were seen in bilateral subclavian               arteries.   CT Calcium Score  01/12/2020: FINDINGS: CORONARY CALCIUM SCORES:   Left Main: 0   LAD: 93   LCx: 0   RCA: 42   Total Agatston Score: 135   MESA database percentile: 48th   AORTA MEASUREMENTS:   Ascending Aorta: 35 mm   Descending Aorta: 26 mm   OTHER FINDINGS: Mild calcifications of  the aortic valve. Aortic atherosclerosis. Extensive pleuroparenchymal thickening and architectural distortion in the right hemithorax. 7 x 5 mm (mean diameter of 6 mm) right lower lobe pulmonary nodule (axial image 48 of series 9), new compared to the prior examination. 4 mm right upper lobe nodule (axial image 12 of series 9), stable compared to the prior examination, considered definitively benign. Within the visualized portions of the thorax there are no suspicious appearing pulmonary nodules or masses, there is no acute consolidative airspace disease, no pleural effusions, no pneumothorax and no lymphadenopathy. Visualized portions of the upper abdomen are unremarkable. T9 sclerotic lesion with narrow zone of transition, stable compared to the prior study, most compatible with a bone island. There are no other aggressive appearing lytic or blastic lesions noted in the visualized portions of the skeleton.   IMPRESSION: 1. New right lower lobe pulmonary nodule with a mean diameter of 6 mm (axial image 48 of series 9). Non-contrast chest CT at  6-12 months is recommended. If the nodule is stable at time of repeat CT, then future CT at 18-24 months (from today's scan) is considered optional for low-risk patients, but is recommended for high-risk patients. This recommendation follows the consensus statement: Guidelines for Management of Incidental Pulmonary Nodules Detected on CT Images: From the Fleischner Society 2017; Radiology 2017; 284:228-243. 2. There are calcifications of the aortic valve. Echocardiographic correlation for evaluation of potential valvular dysfunction may be warranted if clinically indicated. 3.  Aortic Atherosclerosis (ICD10-I70.0). 4. Chronic pleuroparenchymal scarring in the right hemithorax, stable compared to prior examinations.  Prior echocardiogram 07/24/17: - Left ventricle: The cavity size was normal. There was mild    concentric hypertrophy. Systolic function was normal. The    estimated ejection fraction was in the range of 60% to 65%. Wall    motion was normal; there were no regional wall motion    abnormalities. Left ventricular diastolic function parameters    were normal.  - Aortic valve: Valve area (Vmax): 1.99 cm^2.  - Right ventricle: The cavity size was mildly dilated. Wall    thickness was normal.  - Pulmonary arteries: Systolic pressure was moderately increased.    PA peak pressure: 43 mm Hg (S).   Impressions:  - There is no significant change since the prior study on    02/13/2017.   EKG:  EKG is personally reviewed.  03/11/2022:  Sinus bradycardia. Rate 53 bpm. 06/29/2019-sinus bradycardia 54 with nonspecific T wave flattening  Recent Labs: No results found for requested labs within last 365 days.   Recent Lipid Panel    Component Value Date/Time   CHOL 132 07/25/2017 0442   CHOL 159 02/10/2017 1307   TRIG 102 07/25/2017 0442   HDL 37 (L) 07/25/2017 0442   HDL 67 02/10/2017 1307   CHOLHDL 3.6 07/25/2017 0442   VLDL 20 07/25/2017 0442   LDLCALC 75 07/25/2017 0442    LDLCALC 58 02/10/2017 1307    Physical Exam:    VS:  BP 126/62   Pulse (!) 53   Ht '5\' 11"'$  (1.803 m)   Wt 223 lb 2 oz (101.2 kg)   SpO2 96%   BMI 31.12 kg/m     Wt Readings from Last 3 Encounters:  03/11/22 223 lb 2 oz (101.2 kg)  05/25/20 245 lb (111.1 kg)  02/27/20 241 lb (109.3 kg)     GEN:  Well nourished, well developed in no acute distress HEENT: Normal NECK: No JVD; No carotid bruits LYMPHATICS: No lymphadenopathy CARDIAC: RRR,  no murmurs, rubs, gallops RESPIRATORY:  Clear to auscultation without rales, wheezing or rhonchi  ABDOMEN: Soft, non-tender, non-distended MUSCULOSKELETAL:  No edema; No deformity  SKIN: Warm and dry NEUROLOGIC:  Alert and oriented x 3 PSYCHIATRIC:  Normal affect   ASSESSMENT:    1. Diabetes mellitus with coincident hypertension ([place])   2. Hyperlipidemia, unspecified hyperlipidemia type     PLAN:    In order of problems listed above:  Hypertension, essential with diabetes -Under excellent control especially during a stressful tax season.  No changes made.  Dr. Joylene Draft.  Last hemoglobin A1c 6.1, creatinine 0.8, hemoglobin 14.5, ALT 13  Weight loss - Excellent job, down from the 240s down to the 220s.  Wonderful.  Continue.  Hyperlipidemia -On atorvastatin 40 mg, Zetia 10 mg.  LDL 52, no myalgias.  He has been on for years.  Excellent.  Well without any myalgias  Follow-up:  1 year.  Medication Adjustments/Labs and Tests Ordered: Current medicines are reviewed at length with the patient today.  Concerns regarding medicines are outlined above.   Orders Placed This Encounter  Procedures   EKG 12-Lead   No orders of the defined types were placed in this encounter.  Patient Instructions  Medication Instructions:  The current medical regimen is effective;  continue present plan and medications.  *If you need a refill on your cardiac medications before your next appointment, please call your pharmacy*  Follow-Up: At [facility], you and your health needs are our priority.  As part of our continuing mission to provide you with exceptional heart care, we have created designated Provider Care Teams.  These Care Teams include your primary Cardiologist (physician) and Advanced Practice Providers (APPs -  Physician Assistants and Nurse Practitioners) who all work together to provide you with the care you need, when you need it.  We recommend signing up for the patient portal called "MyChart".  Sign up information is provided on this After Visit Summary.  MyChart is used to connect with patients for Virtual Visits (Telemedicine).  Patients are able to view lab/test results, encounter notes, upcoming appointments, etc.  Non-urgent messages can be sent to your provider as well.   To learn more about what you can do with MyChart, go to [email or web address].    Your next appointment:   1 year(s)  The format for your next appointment:   In Person  Provider:   Candee Furbish, MD     Important Information About Sugar         I,Mathew Stumpf,acting as a scribe for Candee Furbish, MD.,have documented all relevant documentation on the behalf of Candee Furbish, MD,as directed by  Candee Furbish, MD while in the presence of Candee Furbish, MD.  I, Candee Furbish, MD, have reviewed all documentation for this visit. The documentation on 03/11/22 for the exam, diagnosis, procedures, and orders are all accurate and complete.   Signed, Candee Furbish, MD  03/11/2022 4:34 PM    [facility]

## 2022-03-11 NOTE — Patient Instructions (Signed)
Medication Instructions:  The current medical regimen is effective;  continue present plan and medications.  *If you need a refill on your cardiac medications before your next appointment, please call your pharmacy*  Follow-Up: At [facility], you and your health needs are our priority.  As part of our continuing mission to provide you with exceptional heart care, we have created designated Provider Care Teams.  These Care Teams include your primary Cardiologist (physician) and Advanced Practice Providers (APPs -  Physician Assistants and Nurse Practitioners) who all work together to provide you with the care you need, when you need it.  We recommend signing up for the patient portal called "MyChart".  Sign up information is provided on this After Visit Summary.  MyChart is used to connect with patients for Virtual Visits (Telemedicine).  Patients are able to view lab/test results, encounter notes, upcoming appointments, etc.  Non-urgent messages can be sent to your provider as well.   To learn more about what you can do with MyChart, go to [email or web address].    Your next appointment:   1 year(s)  The format for your next appointment:   In Person  Provider:   Riaan Skains, MD      Important Information About Sugar       

## 2022-03-12 DIAGNOSIS — C61 Malignant neoplasm of prostate: Secondary | ICD-10-CM | POA: Diagnosis not present

## 2022-03-12 DIAGNOSIS — R351 Nocturia: Secondary | ICD-10-CM | POA: Diagnosis not present

## 2022-03-12 DIAGNOSIS — J189 Pneumonia, unspecified organism: Secondary | ICD-10-CM

## 2022-03-12 DIAGNOSIS — N401 Enlarged prostate with lower urinary tract symptoms: Secondary | ICD-10-CM | POA: Diagnosis not present

## 2022-03-12 HISTORY — DX: Pneumonia, unspecified organism: J18.9

## 2022-03-19 DIAGNOSIS — E1169 Type 2 diabetes mellitus with other specified complication: Secondary | ICD-10-CM | POA: Diagnosis not present

## 2022-03-19 DIAGNOSIS — R0902 Hypoxemia: Secondary | ICD-10-CM | POA: Diagnosis not present

## 2022-03-19 DIAGNOSIS — J168 Pneumonia due to other specified infectious organisms: Secondary | ICD-10-CM | POA: Diagnosis not present

## 2022-03-19 DIAGNOSIS — J129 Viral pneumonia, unspecified: Secondary | ICD-10-CM | POA: Diagnosis not present

## 2022-03-19 DIAGNOSIS — Z1152 Encounter for screening for COVID-19: Secondary | ICD-10-CM | POA: Diagnosis not present

## 2022-03-19 DIAGNOSIS — B9789 Other viral agents as the cause of diseases classified elsewhere: Secondary | ICD-10-CM | POA: Diagnosis present

## 2022-03-19 DIAGNOSIS — R0682 Tachypnea, not elsewhere classified: Secondary | ICD-10-CM | POA: Diagnosis not present

## 2022-03-19 DIAGNOSIS — I2510 Atherosclerotic heart disease of native coronary artery without angina pectoris: Secondary | ICD-10-CM | POA: Diagnosis not present

## 2022-03-19 DIAGNOSIS — R0689 Other abnormalities of breathing: Secondary | ICD-10-CM | POA: Diagnosis not present

## 2022-03-19 DIAGNOSIS — J189 Pneumonia, unspecified organism: Secondary | ICD-10-CM | POA: Diagnosis not present

## 2022-03-19 DIAGNOSIS — J90 Pleural effusion, not elsewhere classified: Secondary | ICD-10-CM | POA: Diagnosis not present

## 2022-03-19 DIAGNOSIS — R051 Acute cough: Secondary | ICD-10-CM | POA: Diagnosis not present

## 2022-03-19 DIAGNOSIS — J00 Acute nasopharyngitis [common cold]: Secondary | ICD-10-CM | POA: Diagnosis not present

## 2022-03-19 DIAGNOSIS — I10 Essential (primary) hypertension: Secondary | ICD-10-CM | POA: Diagnosis not present

## 2022-03-19 DIAGNOSIS — J206 Acute bronchitis due to rhinovirus: Secondary | ICD-10-CM | POA: Diagnosis not present

## 2022-03-19 DIAGNOSIS — J159 Unspecified bacterial pneumonia: Secondary | ICD-10-CM | POA: Diagnosis not present

## 2022-03-19 DIAGNOSIS — Z8546 Personal history of malignant neoplasm of prostate: Secondary | ICD-10-CM | POA: Diagnosis not present

## 2022-03-19 DIAGNOSIS — R0602 Shortness of breath: Secondary | ICD-10-CM | POA: Diagnosis not present

## 2022-03-19 DIAGNOSIS — R070 Pain in throat: Secondary | ICD-10-CM | POA: Diagnosis not present

## 2022-03-19 DIAGNOSIS — R000 Tachycardia, unspecified: Secondary | ICD-10-CM | POA: Diagnosis not present

## 2022-03-19 DIAGNOSIS — J9811 Atelectasis: Secondary | ICD-10-CM | POA: Diagnosis not present

## 2022-03-19 DIAGNOSIS — E119 Type 2 diabetes mellitus without complications: Secondary | ICD-10-CM | POA: Diagnosis not present

## 2022-03-19 DIAGNOSIS — C61 Malignant neoplasm of prostate: Secondary | ICD-10-CM | POA: Diagnosis not present

## 2022-03-26 DIAGNOSIS — E1129 Type 2 diabetes mellitus with other diabetic kidney complication: Secondary | ICD-10-CM | POA: Diagnosis not present

## 2022-03-26 DIAGNOSIS — J189 Pneumonia, unspecified organism: Secondary | ICD-10-CM | POA: Diagnosis not present

## 2022-03-26 DIAGNOSIS — I10 Essential (primary) hypertension: Secondary | ICD-10-CM | POA: Diagnosis not present

## 2022-04-22 DIAGNOSIS — E785 Hyperlipidemia, unspecified: Secondary | ICD-10-CM | POA: Diagnosis not present

## 2022-04-22 DIAGNOSIS — I10 Essential (primary) hypertension: Secondary | ICD-10-CM | POA: Diagnosis not present

## 2022-04-22 DIAGNOSIS — E1129 Type 2 diabetes mellitus with other diabetic kidney complication: Secondary | ICD-10-CM | POA: Diagnosis not present

## 2022-04-22 DIAGNOSIS — Z794 Long term (current) use of insulin: Secondary | ICD-10-CM | POA: Diagnosis not present

## 2022-07-22 DIAGNOSIS — E1129 Type 2 diabetes mellitus with other diabetic kidney complication: Secondary | ICD-10-CM | POA: Diagnosis not present

## 2022-07-22 DIAGNOSIS — E785 Hyperlipidemia, unspecified: Secondary | ICD-10-CM | POA: Diagnosis not present

## 2022-07-22 DIAGNOSIS — I10 Essential (primary) hypertension: Secondary | ICD-10-CM | POA: Diagnosis not present

## 2022-07-22 DIAGNOSIS — Z794 Long term (current) use of insulin: Secondary | ICD-10-CM | POA: Diagnosis not present

## 2022-09-03 DIAGNOSIS — C61 Malignant neoplasm of prostate: Secondary | ICD-10-CM | POA: Diagnosis not present

## 2022-09-08 DIAGNOSIS — E785 Hyperlipidemia, unspecified: Secondary | ICD-10-CM | POA: Diagnosis not present

## 2022-09-08 DIAGNOSIS — D126 Benign neoplasm of colon, unspecified: Secondary | ICD-10-CM | POA: Diagnosis not present

## 2022-09-08 DIAGNOSIS — Z794 Long term (current) use of insulin: Secondary | ICD-10-CM | POA: Diagnosis not present

## 2022-09-08 DIAGNOSIS — I272 Pulmonary hypertension, unspecified: Secondary | ICD-10-CM | POA: Diagnosis not present

## 2022-09-08 DIAGNOSIS — L408 Other psoriasis: Secondary | ICD-10-CM | POA: Diagnosis not present

## 2022-09-08 DIAGNOSIS — J189 Pneumonia, unspecified organism: Secondary | ICD-10-CM | POA: Diagnosis not present

## 2022-09-08 DIAGNOSIS — E1129 Type 2 diabetes mellitus with other diabetic kidney complication: Secondary | ICD-10-CM | POA: Diagnosis not present

## 2022-09-08 DIAGNOSIS — C61 Malignant neoplasm of prostate: Secondary | ICD-10-CM | POA: Diagnosis not present

## 2022-09-08 DIAGNOSIS — I700 Atherosclerosis of aorta: Secondary | ICD-10-CM | POA: Diagnosis not present

## 2022-09-08 DIAGNOSIS — E1165 Type 2 diabetes mellitus with hyperglycemia: Secondary | ICD-10-CM | POA: Diagnosis not present

## 2022-09-08 DIAGNOSIS — I6529 Occlusion and stenosis of unspecified carotid artery: Secondary | ICD-10-CM | POA: Diagnosis not present

## 2022-09-08 DIAGNOSIS — I10 Essential (primary) hypertension: Secondary | ICD-10-CM | POA: Diagnosis not present

## 2022-09-10 DIAGNOSIS — Z8546 Personal history of malignant neoplasm of prostate: Secondary | ICD-10-CM | POA: Diagnosis not present

## 2022-09-10 DIAGNOSIS — N401 Enlarged prostate with lower urinary tract symptoms: Secondary | ICD-10-CM | POA: Diagnosis not present

## 2022-09-10 DIAGNOSIS — R351 Nocturia: Secondary | ICD-10-CM | POA: Diagnosis not present

## 2022-10-01 ENCOUNTER — Other Ambulatory Visit (HOSPITAL_COMMUNITY): Payer: Self-pay | Admitting: *Deleted

## 2022-10-01 ENCOUNTER — Ambulatory Visit (HOSPITAL_COMMUNITY)
Admission: RE | Admit: 2022-10-01 | Discharge: 2022-10-01 | Disposition: A | Discharge status: Home / Self Care | Payer: Medicare Other | Source: Ambulatory Visit | Attending: Internal Medicine | Admitting: Internal Medicine

## 2022-10-01 DIAGNOSIS — M810 Age-related osteoporosis without current pathological fracture: Secondary | ICD-10-CM | POA: Insufficient documentation

## 2022-10-01 DIAGNOSIS — I10 Essential (primary) hypertension: Secondary | ICD-10-CM | POA: Diagnosis not present

## 2022-10-01 DIAGNOSIS — E1129 Type 2 diabetes mellitus with other diabetic kidney complication: Secondary | ICD-10-CM | POA: Diagnosis not present

## 2022-10-01 DIAGNOSIS — F329 Major depressive disorder, single episode, unspecified: Secondary | ICD-10-CM | POA: Diagnosis not present

## 2022-10-01 MED ORDER — DENOSUMAB 60 MG/ML ~~LOC~~ SOSY
PREFILLED_SYRINGE | SUBCUTANEOUS | Status: AC
  Filled 2022-10-01: qty 1

## 2022-10-01 MED ORDER — DENOSUMAB 60 MG/ML ~~LOC~~ SOSY
60.0000 mg | PREFILLED_SYRINGE | Freq: Once | SUBCUTANEOUS | Status: AC
  Administered 2022-10-01: 60 mg via SUBCUTANEOUS

## 2022-10-02 ENCOUNTER — Inpatient Hospital Stay (HOSPITAL_COMMUNITY): Admission: RE | Admit: 2022-10-02 | Payer: Medicare Other | Source: Ambulatory Visit

## 2022-10-08 ENCOUNTER — Inpatient Hospital Stay (HOSPITAL_COMMUNITY)
Admission: EM | Admit: 2022-10-08 | Discharge: 2022-10-10 | DRG: 041 | Disposition: A | Discharge status: Rehabilitation Facility | Payer: Medicare Other | Attending: Family Medicine | Admitting: Family Medicine

## 2022-10-08 ENCOUNTER — Inpatient Hospital Stay (HOSPITAL_COMMUNITY): Payer: Medicare Other

## 2022-10-08 ENCOUNTER — Encounter (HOSPITAL_COMMUNITY): Payer: Self-pay

## 2022-10-08 ENCOUNTER — Emergency Department (HOSPITAL_COMMUNITY): Payer: Medicare Other

## 2022-10-08 ENCOUNTER — Other Ambulatory Visit: Payer: Self-pay

## 2022-10-08 DIAGNOSIS — I69393 Ataxia following cerebral infarction: Secondary | ICD-10-CM | POA: Diagnosis not present

## 2022-10-08 DIAGNOSIS — E876 Hypokalemia: Secondary | ICD-10-CM | POA: Diagnosis not present

## 2022-10-08 DIAGNOSIS — F419 Anxiety disorder, unspecified: Secondary | ICD-10-CM | POA: Diagnosis present

## 2022-10-08 DIAGNOSIS — I110 Hypertensive heart disease with heart failure: Secondary | ICD-10-CM | POA: Diagnosis not present

## 2022-10-08 DIAGNOSIS — E785 Hyperlipidemia, unspecified: Secondary | ICD-10-CM | POA: Diagnosis not present

## 2022-10-08 DIAGNOSIS — I10 Essential (primary) hypertension: Secondary | ICD-10-CM | POA: Insufficient documentation

## 2022-10-08 DIAGNOSIS — Z7984 Long term (current) use of oral hypoglycemic drugs: Secondary | ICD-10-CM

## 2022-10-08 DIAGNOSIS — I358 Other nonrheumatic aortic valve disorders: Secondary | ICD-10-CM | POA: Diagnosis present

## 2022-10-08 DIAGNOSIS — E782 Mixed hyperlipidemia: Secondary | ICD-10-CM | POA: Diagnosis present

## 2022-10-08 DIAGNOSIS — Z981 Arthrodesis status: Secondary | ICD-10-CM | POA: Diagnosis not present

## 2022-10-08 DIAGNOSIS — I69354 Hemiplegia and hemiparesis following cerebral infarction affecting left non-dominant side: Secondary | ICD-10-CM | POA: Diagnosis not present

## 2022-10-08 DIAGNOSIS — E1169 Type 2 diabetes mellitus with other specified complication: Secondary | ICD-10-CM | POA: Diagnosis not present

## 2022-10-08 DIAGNOSIS — F32A Depression, unspecified: Secondary | ICD-10-CM | POA: Diagnosis not present

## 2022-10-08 DIAGNOSIS — N400 Enlarged prostate without lower urinary tract symptoms: Secondary | ICD-10-CM | POA: Diagnosis present

## 2022-10-08 DIAGNOSIS — Z880 Allergy status to penicillin: Secondary | ICD-10-CM | POA: Diagnosis not present

## 2022-10-08 DIAGNOSIS — G8314 Monoplegia of lower limb affecting left nondominant side: Secondary | ICD-10-CM | POA: Diagnosis present

## 2022-10-08 DIAGNOSIS — D649 Anemia, unspecified: Secondary | ICD-10-CM | POA: Diagnosis not present

## 2022-10-08 DIAGNOSIS — Z6831 Body mass index (BMI) 31.0-31.9, adult: Secondary | ICD-10-CM

## 2022-10-08 DIAGNOSIS — E1165 Type 2 diabetes mellitus with hyperglycemia: Secondary | ICD-10-CM | POA: Diagnosis present

## 2022-10-08 DIAGNOSIS — G8311 Monoplegia of lower limb affecting right dominant side: Secondary | ICD-10-CM | POA: Diagnosis not present

## 2022-10-08 DIAGNOSIS — R471 Dysarthria and anarthria: Secondary | ICD-10-CM | POA: Diagnosis present

## 2022-10-08 DIAGNOSIS — F439 Reaction to severe stress, unspecified: Secondary | ICD-10-CM | POA: Diagnosis not present

## 2022-10-08 DIAGNOSIS — Z8673 Personal history of transient ischemic attack (TIA), and cerebral infarction without residual deficits: Secondary | ICD-10-CM

## 2022-10-08 DIAGNOSIS — Z7902 Long term (current) use of antithrombotics/antiplatelets: Secondary | ICD-10-CM

## 2022-10-08 DIAGNOSIS — D509 Iron deficiency anemia, unspecified: Secondary | ICD-10-CM | POA: Diagnosis not present

## 2022-10-08 DIAGNOSIS — E119 Type 2 diabetes mellitus without complications: Secondary | ICD-10-CM | POA: Diagnosis not present

## 2022-10-08 DIAGNOSIS — H903 Sensorineural hearing loss, bilateral: Secondary | ICD-10-CM | POA: Diagnosis not present

## 2022-10-08 DIAGNOSIS — Z79899 Other long term (current) drug therapy: Secondary | ICD-10-CM | POA: Diagnosis not present

## 2022-10-08 DIAGNOSIS — Z8546 Personal history of malignant neoplasm of prostate: Secondary | ICD-10-CM

## 2022-10-08 DIAGNOSIS — R001 Bradycardia, unspecified: Secondary | ICD-10-CM | POA: Diagnosis not present

## 2022-10-08 DIAGNOSIS — I6389 Other cerebral infarction: Secondary | ICD-10-CM | POA: Diagnosis not present

## 2022-10-08 DIAGNOSIS — I63423 Cerebral infarction due to embolism of bilateral anterior cerebral arteries: Principal | ICD-10-CM | POA: Diagnosis present

## 2022-10-08 DIAGNOSIS — E1149 Type 2 diabetes mellitus with other diabetic neurological complication: Secondary | ICD-10-CM | POA: Diagnosis not present

## 2022-10-08 DIAGNOSIS — Z833 Family history of diabetes mellitus: Secondary | ICD-10-CM

## 2022-10-08 DIAGNOSIS — W010XXA Fall on same level from slipping, tripping and stumbling without subsequent striking against object, initial encounter: Secondary | ICD-10-CM | POA: Diagnosis present

## 2022-10-08 DIAGNOSIS — Z8719 Personal history of other diseases of the digestive system: Secondary | ICD-10-CM

## 2022-10-08 DIAGNOSIS — Z87891 Personal history of nicotine dependence: Secondary | ICD-10-CM

## 2022-10-08 DIAGNOSIS — G4733 Obstructive sleep apnea (adult) (pediatric): Secondary | ICD-10-CM | POA: Diagnosis not present

## 2022-10-08 DIAGNOSIS — Z7985 Long-term (current) use of injectable non-insulin antidiabetic drugs: Secondary | ICD-10-CM | POA: Diagnosis not present

## 2022-10-08 DIAGNOSIS — I69351 Hemiplegia and hemiparesis following cerebral infarction affecting right dominant side: Secondary | ICD-10-CM | POA: Diagnosis not present

## 2022-10-08 DIAGNOSIS — Z7982 Long term (current) use of aspirin: Secondary | ICD-10-CM | POA: Diagnosis not present

## 2022-10-08 DIAGNOSIS — G4730 Sleep apnea, unspecified: Secondary | ICD-10-CM | POA: Diagnosis present

## 2022-10-08 DIAGNOSIS — I5032 Chronic diastolic (congestive) heart failure: Secondary | ICD-10-CM | POA: Insufficient documentation

## 2022-10-08 DIAGNOSIS — Z794 Long term (current) use of insulin: Secondary | ICD-10-CM

## 2022-10-08 DIAGNOSIS — I639 Cerebral infarction, unspecified: Secondary | ICD-10-CM | POA: Diagnosis not present

## 2022-10-08 DIAGNOSIS — G4700 Insomnia, unspecified: Secondary | ICD-10-CM | POA: Diagnosis not present

## 2022-10-08 DIAGNOSIS — Z923 Personal history of irradiation: Secondary | ICD-10-CM

## 2022-10-08 DIAGNOSIS — E669 Obesity, unspecified: Secondary | ICD-10-CM | POA: Diagnosis present

## 2022-10-08 DIAGNOSIS — R29701 NIHSS score 1: Secondary | ICD-10-CM | POA: Diagnosis present

## 2022-10-08 DIAGNOSIS — Z888 Allergy status to other drugs, medicaments and biological substances status: Secondary | ICD-10-CM | POA: Diagnosis not present

## 2022-10-08 HISTORY — DX: Diverticulosis of intestine, part unspecified, without perforation or abscess without bleeding: K57.90

## 2022-10-08 HISTORY — DX: Iron deficiency anemia, unspecified: D50.9

## 2022-10-08 HISTORY — DX: Spinal stenosis, cervical region: M48.02

## 2022-10-08 LAB — COMPREHENSIVE METABOLIC PANEL
ALT: 16 U/L (ref 0–44)
AST: 17 U/L (ref 15–41)
Albumin: 3.8 g/dL (ref 3.5–5.0)
Alkaline Phosphatase: 51 U/L (ref 38–126)
Anion gap: 9 (ref 5–15)
BUN: 22 mg/dL (ref 8–23)
CO2: 28 mmol/L (ref 22–32)
Calcium: 9.7 mg/dL (ref 8.9–10.3)
Chloride: 100 mmol/L (ref 98–111)
Creatinine, Ser: 0.9 mg/dL (ref 0.61–1.24)
GFR, Estimated: >60 mL/min (ref >60)
Glucose, Bld: 96 mg/dL (ref 70–99)
Potassium: 3.4 mmol/L — ABNORMAL LOW (ref 3.5–5.1)
Sodium: 137 mmol/L (ref 135–145)
Total Bilirubin: 1.5 mg/dL — ABNORMAL HIGH (ref 0.3–1.2)
Total Protein: 7.1 g/dL (ref 6.5–8.1)

## 2022-10-08 LAB — CBC WITH DIFFERENTIAL/PLATELET
Abs Immature Granulocytes: 0.03 10*3/uL (ref 0.00–0.07)
Basophils Absolute: 0.1 10*3/uL (ref 0.0–0.1)
Basophils Relative: 1 %
Eosinophils Absolute: 0.2 10*3/uL (ref 0.0–0.5)
Eosinophils Relative: 2 %
HCT: 37.9 % — ABNORMAL LOW (ref 39.0–52.0)
Hemoglobin: 12.4 g/dL — ABNORMAL LOW (ref 13.0–17.0)
Immature Granulocytes: 0 %
Lymphocytes Relative: 18 %
Lymphs Abs: 1.7 10*3/uL (ref 0.7–4.0)
MCH: 29.6 pg (ref 26.0–34.0)
MCHC: 32.7 g/dL (ref 30.0–36.0)
MCV: 90.5 fL (ref 80.0–100.0)
Monocytes Absolute: 1.3 10*3/uL — ABNORMAL HIGH (ref 0.1–1.0)
Monocytes Relative: 14 %
Neutro Abs: 6.1 10*3/uL (ref 1.7–7.7)
Neutrophils Relative %: 65 %
Platelets: 180 10*3/uL (ref 150–400)
RBC: 4.19 MIL/uL — ABNORMAL LOW (ref 4.22–5.81)
RDW: 12.7 % (ref 11.5–15.5)
WBC: 9.3 10*3/uL (ref 4.0–10.5)
nRBC: 0 % (ref 0.0–0.2)

## 2022-10-08 LAB — URINALYSIS, ROUTINE W REFLEX MICROSCOPIC
Bilirubin Urine: NEGATIVE
Glucose, UA: NEGATIVE mg/dL
Hgb urine dipstick: NEGATIVE
Ketones, ur: NEGATIVE mg/dL
Leukocytes,Ua: NEGATIVE
Nitrite: NEGATIVE
Protein, ur: NEGATIVE mg/dL
Specific Gravity, Urine: 1.009 (ref 1.005–1.030)
pH: 6 (ref 5.0–8.0)

## 2022-10-08 LAB — CBG MONITORING, ED: Glucose-Capillary: 102 mg/dL — ABNORMAL HIGH (ref 70–99)

## 2022-10-08 MED ORDER — CLOPIDOGREL BISULFATE 75 MG PO TABS
75.0000 mg | ORAL_TABLET | Freq: Every day | ORAL | Status: DC
  Administered 2022-10-08 – 2022-10-10 (×3): 75 mg via ORAL
  Filled 2022-10-08 (×4): qty 1

## 2022-10-08 MED ORDER — POTASSIUM CHLORIDE 2 MEQ/ML IV SOLN
INTRAVENOUS | Status: AC
  Filled 2022-10-08 (×2): qty 1000

## 2022-10-08 MED ORDER — MELATONIN 3 MG PO TABS: 3.0000 mg | ORAL_TABLET | Freq: Every evening | ORAL | Status: DC | PRN

## 2022-10-08 MED ORDER — IOHEXOL 350 MG/ML SOLN
75.0000 mL | Freq: Once | INTRAVENOUS | Status: AC | PRN
  Administered 2022-10-08: 75 mL via INTRAVENOUS

## 2022-10-08 MED ORDER — POLYETHYLENE GLYCOL 3350 17 G PO PACK: 17.0000 g | PACK | Freq: Every day | ORAL | Status: DC | PRN

## 2022-10-08 MED ORDER — POTASSIUM CHLORIDE 2 MEQ/ML IV SOLN: INTRAVENOUS | Status: DC

## 2022-10-08 MED ORDER — LABETALOL HCL 5 MG/ML IV SOLN: 5.0000 mg | INTRAVENOUS | Status: DC | PRN

## 2022-10-08 MED ORDER — ATORVASTATIN CALCIUM 80 MG PO TABS
80.0000 mg | ORAL_TABLET | Freq: Every day | ORAL | Status: DC
  Administered 2022-10-08 – 2022-10-10 (×3): 80 mg via ORAL
  Filled 2022-10-08 (×3): qty 1

## 2022-10-08 MED ORDER — EZETIMIBE 10 MG PO TABS
10.0000 mg | ORAL_TABLET | Freq: Every day | ORAL | Status: DC
  Administered 2022-10-09 – 2022-10-10 (×2): 10 mg via ORAL
  Filled 2022-10-08 (×2): qty 1

## 2022-10-08 MED ORDER — ASPIRIN 81 MG PO TBEC
81.0000 mg | DELAYED_RELEASE_TABLET | Freq: Every day | ORAL | Status: DC
  Administered 2022-10-08 – 2022-10-10 (×3): 81 mg via ORAL
  Filled 2022-10-08 (×3): qty 1

## 2022-10-08 MED ORDER — ACETAMINOPHEN 325 MG PO TABS
650.0000 mg | ORAL_TABLET | Freq: Four times a day (QID) | ORAL | Status: DC | PRN
  Filled 2022-10-08: qty 2

## 2022-10-08 MED ORDER — PROCHLORPERAZINE EDISYLATE 10 MG/2ML IJ SOLN: 5.0000 mg | Freq: Four times a day (QID) | INTRAMUSCULAR | Status: DC | PRN

## 2022-10-08 MED ORDER — ENOXAPARIN SODIUM 40 MG/0.4ML IJ SOSY
40.0000 mg | PREFILLED_SYRINGE | INTRAMUSCULAR | Status: DC
  Administered 2022-10-08 – 2022-10-09 (×2): 40 mg via SUBCUTANEOUS
  Filled 2022-10-08 (×2): qty 0.4

## 2022-10-08 NOTE — ED Notes (Signed)
Report called to [facility]

## 2022-10-08 NOTE — ED Notes (Signed)
Pt is off the floor in X-RAY will obtain vitals signs on return

## 2022-10-08 NOTE — ED Triage Notes (Signed)
C/o bilateral leg numbness and weakness to left leg upom waking this am at 0600.  Patient reports falling this am due to weakness.  Denies hitting head, loc, or blood thinner usage Went to bed last night at 0930 normal

## 2022-10-08 NOTE — ED Notes (Signed)
[facility] called. 

## 2022-10-08 NOTE — ED Provider Notes (Signed)
[place] EMERGENCY DEPARTMENT AT [facility] Provider Note   CSN: [id number] Arrival date & time: 10/08/22  1516     History  Chief Complaint  Patient presents with   Numbness   Weakness    KIA GUSH is a 72 y.o. male.  HPI   72 year old male with past medical history of HTN, HLD, chronic hearing loss, DM presents to the emergency department with concern for weakness in his legs.  Patient states he had a normal day yesterday, went to bed around 10 PM.  He had 1 overnight waking for the bathroom and did not remember anything abnormal.  When he woke up this morning around 6 AM which is his usual time he felt weakness in both of his legs, more pronounced on the left.  When he was walking and carrying laundry he fell.  He describes as losing his balance and falling down onto his bottom.  He states that it was low mechanism, did not hit his head, no loss of consciousness/syncope, denies any injury from the fall.  Presents now with ongoing weakness in the lower extremities, continues to be worse on the left side.  He has a hard time differentiating between weakness/numbness.  But he denies any upper extremity symptoms, facial droop, vision change, speech difficulty.  Home Medications Prior to Admission medications   Medication Sig Start Date End Date Taking? Authorizing Provider  acetaminophen (TYLENOL) 500 MG tablet Take 500 mg by mouth daily as needed for headache.    [provider]  aspirin 81 MG tablet Take 81 mg by mouth daily.    [provider]  atorvastatin (LIPITOR) 40 MG tablet Take 40 mg by mouth daily. 05/28/19   [provider]  B-D UF III MINI PEN NEEDLES 31G X 5 MM MISC Inject 1 Stick as directed as directed. 11/08/15   [provider]  benazepril (LOTENSIN) 40 MG tablet Take 1 tablet (40 mg total) by mouth daily. Please schedule appt for future refills. 1st attempt 01/18/18   Jake Bathe, MD  chlorthalidone (HYGROTON) 25  MG tablet Take 25 mg by mouth daily. 05/19/19   [provider]  ezetimibe (ZETIA) 10 MG tablet  01/19/20   [provider]  metFORMIN (GLUCOPHAGE) 1000 MG tablet Take 1,000 mg by mouth 2 (two) times daily with a meal.    [provider]  tamsulosin (FLOMAX) 0.4 MG CAPS capsule Take 0.4 mg by mouth daily. Patient states he is taking it twice daily 04/13/19   [provider]  TOUJEO SOLOSTAR 300 UNIT/ML Solostar Pen  03/01/20   [provider]  VITAMIN D PO Take by mouth. Twice a day pr patient    [provider]      Allergies    Penicillins    Review of Systems   Review of Systems  Constitutional:  Negative for appetite change, fatigue and fever.  Eyes:  Negative for visual disturbance.  Respiratory:  Negative for shortness of breath.   Cardiovascular:  Negative for chest pain.  Gastrointestinal:  Negative for abdominal pain, diarrhea and vomiting.  Musculoskeletal:  Negative for back pain and neck pain.  Skin:  Negative for rash.  Neurological:  Positive for weakness and numbness. Negative for dizziness, tremors, seizures, syncope, facial asymmetry, speech difficulty, light-headedness and headaches.    Physical Exam Updated Vital Signs BP 116/82   Pulse 66   Temp 98.2 F (36.8 C) (Oral)   Resp 18   Wt 101  kg   SpO2 90%   BMI 31.06 kg/m  Physical Exam Vitals and nursing note reviewed.  Constitutional:      General: He is not in acute distress.    Appearance: Normal appearance. He is not ill-appearing.  HENT:     Head: Normocephalic.     Mouth/Throat:     Mouth: Mucous membranes are moist.  Eyes:     Extraocular Movements: Extraocular movements intact.     Pupils: Pupils are equal, round, and reactive to light.  Cardiovascular:     Rate and Rhythm: Normal rate.  Pulmonary:     Effort: Pulmonary effort is normal. No respiratory distress.  Abdominal:     Palpations: Abdomen is soft.     Tenderness: There is no  abdominal tenderness.  Skin:    General: Skin is warm.  Neurological:     Mental Status: He is alert and oriented to person, place, and time. Mental status is at baseline.     Cranial Nerves: No cranial nerve deficit.     Comments: 4/5 strength RLE lifting off bed with quick drop to bed, 3/5 strength on LLE, sensory equal b/l, pulses equal, equal grip strength, face symmetric, speech basline  Psychiatric:        Mood and Affect: Mood normal.     ED Results / Procedures / Treatments   Labs (all labs ordered are listed, but only abnormal results are displayed) Labs Reviewed  CBG MONITORING, ED - Abnormal; Notable for the following components:      Result Value   Glucose-Capillary 102 (*)    All other components within normal limits  CBC WITH DIFFERENTIAL/PLATELET  COMPREHENSIVE METABOLIC PANEL  URINALYSIS, ROUTINE W REFLEX MICROSCOPIC    EKG None  Radiology No results found.  Procedures .Critical Care  Performed by: Rozelle Logan, DO Authorized by: Rozelle Logan, DO   Critical care provider statement:    Critical care time (minutes):  45   Critical care was necessary to treat or prevent imminent or life-threatening deterioration of the following conditions:  CNS failure or compromise   Critical care was time spent personally by me on the following activities:  Development of treatment plan with patient or surrogate, discussions with consultants, evaluation of patient's response to treatment, examination of patient, ordering and review of laboratory studies, ordering and review of radiographic studies, ordering and performing treatments and interventions, pulse oximetry, re-evaluation of patient's condition and review of old charts   I assumed direction of critical care for this patient from another provider in my specialty: no     Care discussed with: admitting provider       Medications Ordered in ED Medications - No data to display  ED Course/ Medical Decision  Making/ A&P                             Medical Decision Making Amount and/or Complexity of Data Reviewed Labs: ordered. Radiology: ordered.  Risk Decision regarding hospitalization.  72 year old male presents emergency department with bilateral leg weakness, worse on the left.  Last known normal was 10 PM last night, woke up this morning at 6 AM at his usual time and had difficulty walking.  Vitals are stable on arrival.  On neuroexam he has weakness of his bilateral lower extremities, worse on the left, sensory intact.  No upper extremity/face symptoms.  Blood work is baseline for the patient.  MRI identifies acute strokes in  the bilateral ACA territories.  Consulted with neurology, Dr. Wilford Corner.  Plan for CTA of the head and neck and transferred to [facility] for admission and further neurologic evaluation.  Patients evaluation and results requires admission for further treatment and care.  Spoke with hospitalist, reviewed patient's ED course and they accept admission.  Patient agrees with admission plan, offers no new complaints and is stable/unchanged at time of admit.        Final Clinical Impression(s) / ED Diagnoses Final diagnoses:  None    Rx / DC Orders ED Discharge Orders     None         Rozelle Logan, DO 10/08/22 1955

## 2022-10-08 NOTE — H&P (Addendum)
History and Physical  Kenneth [name] WUJ:[id number] DOB: 09-28-1950 DOA: 10/08/2022  Referring physician: Dr. Wilkie Aye, EDP  PCP: [name], MD  Outpatient Specialists: Cardiology. Patient coming from: Home.  Chief Complaint: Bilateral lower extremity weakness left greater than right.  HPI: Kenneth [name] is a 72 y.o. male with medical history significant for obesity, type 2 diabetes, essential hypertension, hyperlipidemia, BPH, history of malignant neoplasm of prostate status post radiation treatment, history of posterior cervical fusion/anterior cervical decompression in 2014, chronic anxiety/depression, chronic HFpEF 60-65%, who presented to [facility] ED from home due to bilateral lower extremity weakness left greater than right.  Last known well was last night around 10 PM when he walked his dog and prior to going to bed.    This morning, the patient woke up around 6 AM and noted bilateral leg weakness and numbness, left greater than right, which caused him to fall.  Denies hitting his head.  No loss of consciousness.  He takes a baby aspirin every day and is compliant with his cardiac medications which include Lipitor 40 mg daily and Zetia 10 mg daily.  Rather than coming to the hospital he called his primary care provider for an appointment but could not get in quickly therefore came to the ED accompanied by his family friend for further evaluation.  Upon ER presentation, vital signs are stable.  MRI brain revealed the following findings: Acute bilateral ACA territory infarcts (left-greater-than-right) involving the parasagittal aspects of the bilateral parietal lobes. No hemorrhage.   EDP discussed the case with neurology/stroke team, Dr. Wilford Corner, who recommended admission at [facility] for stroke workup.  CT angio head and neck ordered by EDP and the results are pending.  At the time of this visit, the patient was alert and oriented x 3.  He had 3 out of 5 strength involving right  lower extremity and 4 out of 5 strength involving left lower extremity.    To note, the patient is agreeable to transfer to [facility].  States his wife is currently on 5 E. at [facility].  ED Course: Temperature 98.2.  BP 125/56, pulse 57, respiratory 18, O2 saturation 95% on room air.  Lab studies notable for serum potassium 3.4, T. bili 1.5.  Review of Systems: Review of systems as noted in the HPI. All other systems reviewed and are negative.   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   History of colon polyps - adenomas 04/03/2010   Hyperlipidemia    Hypertension    OSA (obstructive sleep apnea)    refusing CPAP   Prostate cancer [facility])    Past Surgical History:  Procedure Laterality Date   ANTERIOR CERVICAL DECOMP/DISCECTOMY FUSION N/A 10/01/2012   Procedure: ANTERIOR CERVICAL DECOMPRESSION/DISCECTOMY FUSION 2 LEVELS;  Surgeon: Tia Alert, MD;  Location: [facility] NEURO ORS;  Service: Neurosurgery;  Laterality: N/A;  Cervical five-six,Cervical six-seven   COLONOSCOPY  09/30/2010, 03/01/2014   diverticulosis, small internal hemorrhoids   COLONOSCOPY W/ POLYPECTOMY  04/03/2010   4 polyps 8-68mm, worst TV adenoma with high-grade dysplasia   NO PAST SURGERIES     POLYPECTOMY     POSTERIOR CERVICAL FUSION/FORAMINOTOMY Left 10/20/2012   Procedure: C/5-6,C/6-7 Lami/Multi level,POSTERIOR CERVICAL FUSION/FORAMINOTOMY C/4-7;  Surgeon: Tia Alert, MD;  Location: [facility] NEURO ORS;  Service: Neurosurgery;  Laterality: Left;  Cervical  Five-Six, Cervical Six-Seven Laminectomies, Posterior Cervical Fusion/Foraminotomies Cervical Four through Seven.    TONSILLECTOMY      Social History:  reports that he quit smoking about 53 years ago. His smoking use included cigarettes. He has a 0.50 pack-year smoking history. He has never used smokeless tobacco. He reports that he does not  drink alcohol and does not use drugs.   Allergies  Allergen Reactions   Penicillins     CHILDHOOD ALLERGY Has patient had a PCN reaction causing immediate rash, facial/tongue/throat swelling, SOB or lightheadedness with hypotension: Unknown Has patient had a PCN reaction causing severe rash involving mucus membranes or skin necrosis: Unknown Has patient had a PCN reaction that required hospitalization: Unknown Has patient had a PCN reaction occurring within the last 10 years: Unknown If all of the above answers are "NO", then may proceed with Cephalosporin use.      Family History  Problem Relation Age of Onset   Diabetes Mother    Stomach cancer Neg Hx    Rectal cancer Neg Hx    Pancreatic cancer Neg Hx    Colon cancer Neg Hx    Colon polyps Neg Hx    Esophageal cancer Neg Hx    Prostate cancer Neg Hx    Breast cancer Neg Hx       Prior to Admission medications   Medication Sig Start Date End Date Taking? Authorizing Provider  acetaminophen (TYLENOL) 500 MG tablet Take 500 mg by mouth daily as needed for headache.    [provider]  aspirin 81 MG tablet Take 81 mg by mouth daily.    [provider]  atorvastatin (LIPITOR) 40 MG tablet Take 40 mg by mouth daily. 05/28/19   [provider]  B-D UF III MINI PEN NEEDLES 31G X 5 MM MISC Inject 1 Stick as directed as directed. 11/08/15   [provider]  benazepril (LOTENSIN) 40 MG tablet Take 1 tablet (40 mg total) by mouth daily. Please schedule appt for future refills. 1st attempt 01/18/18   Jake Bathe, MD  chlorthalidone (HYGROTON) 25 MG tablet Take 25 mg by mouth daily. 05/19/19   [provider]  ezetimibe (ZETIA) 10 MG tablet  01/19/20   [provider]  metFORMIN (GLUCOPHAGE) 1000 MG tablet Take 1,000 mg by mouth 2 (two) times daily with a meal.    [provider]  tamsulosin (FLOMAX) 0.4 MG CAPS capsule Take 0.4 mg by mouth daily. Patient states he is taking it  twice daily 04/13/19   [provider]  TOUJEO SOLOSTAR 300 UNIT/ML Solostar Pen  03/01/20   [provider]  VITAMIN D PO Take by mouth. Twice a day pr patient    [provider]    Physical Exam: BP (!) 125/56   Pulse (!) 57   Temp 98.2 F (36.8 C) (Oral)   Resp 18   Wt 101 kg   SpO2 95%   BMI 31.06 kg/m   General: 72 y.o. year-old male well developed well nourished in no acute distress.  Alert and oriented x3. Cardiovascular: Regular rate and rhythm with no rubs or gallops.  No thyromegaly or JVD noted.  No lower extremity edema. 2/4 pulses in all 4 extremities. Respiratory: Clear to auscultation with no wheezes or rales. Good inspiratory effort. Abdomen: Soft nontender nondistended with normal bowel sounds x4 quadrants. Muskuloskeletal: No cyanosis, clubbing or edema noted bilaterally Neuro: CN II-XII intact, 3-5 strength right lower extremity, 4 out of 5 strength  left lower extremity. Skin: No ulcerative lesions noted or rashes Psychiatry: Judgement and insight appear normal. Mood is appropriate for condition and setting          Labs on Admission:  Basic Metabolic Panel: Recent Labs  Lab 10/08/22 1642  NA 137  K 3.4*  CL 100  CO2 28  GLUCOSE 96  BUN 22  CREATININE 0.90  CALCIUM 9.7   Liver Function Tests: Recent Labs  Lab 10/08/22 1642  AST 17  ALT 16  ALKPHOS 51  BILITOT 1.5*  PROT 7.1  ALBUMIN 3.8   No results for input(s): "LIPASE", "AMYLASE" in the last 168 hours. No results for input(s): "AMMONIA" in the last 168 hours. CBC: Recent Labs  Lab 10/08/22 1642  WBC 9.3  NEUTROABS 6.1  HGB 12.4*  HCT 37.9*  MCV 90.5  PLT 180   Cardiac Enzymes: No results for input(s): "CKTOTAL", "CKMB", "CKMBINDEX", "TROPONINI" in the last 168 hours.  BNP (last 3 results) No results for input(s): "BNP" in the last 8760 hours.  ProBNP (last 3 results) No results for input(s): "PROBNP" in the last 8760 hours.  CBG: Recent Labs   Lab 10/08/22 1544  GLUCAP 102*    Radiological Exams on Admission: MR Brain Wo Contrast (neuro protocol)  Result Date: 10/08/2022 CLINICAL DATA:  Neuro deficit, acute, stroke suspected EXAM: MRI HEAD WITHOUT CONTRAST TECHNIQUE: Multiplanar, multiecho pulse sequences of the brain and surrounding structures were obtained without intravenous contrast. COMPARISON:  CT Head 07/23/17 FINDINGS: Brain: There are acute infarcts in the bilateral ACA territory, left-greater-than-right involving the parasagittal bilateral parietal lobes (series 9, image [id number]). No hemorrhage. No extra-axial fluid collection. No hydrocephalus. Sequela of mild overall chronic microvascular ischemic change. No mass effect. No midline shift. Vascular: Normal flow voids. Skull and upper cervical spine: Normal marrow signal. Sinuses/Orbits: No mastoid or middle ear effusion. Paranasal sinuses are notable for mucosal thickening in the bilateral ethmoid sinuses. Orbits are unremarkable. Other: None. IMPRESSION: Acute bilateral ACA territory infarcts (left-greater-than-right) involving the parasagittal aspects of the bilateral parietal lobes. No hemorrhage. Electronically Signed   By: Lorenza Cambridge M.D.   On: 10/08/2022 18:31    EKG: I independently viewed the EKG done and my findings are as followed: None available at the time of this visit.  Assessment/Plan Present on Admission:  Acute CVA (cerebrovascular accident) (HCC)  Principal Problem:   Acute CVA (cerebrovascular accident) (HCC)  Acute bilateral ACA ischemic CVA, seen on MRI brain, POA Last known well around 10 PM last night, outside of window for TNK tPA Admitted for stroke workup Follow CT angio head and neck 2D echo with bubble study Neurochecks every 4 hours Fasting lipid panel, A1c PT/OT/speech therapist evaluation Permissive hypertension Treat SBP greater than 220 or DBP greater than 120 IV labetalol as needed with parameters The patient was on daily aspirin  81 mg prior to admission. DAPT aspirin and Plavix x 21 days then antiplatelet monotherapy alone indefinitely Defer to neurology for choice of antiplatelet monotherapy High intensity statin.  Prior to admission the patient was on Lipitor 40 mg daily and Zetia 10 mg daily Lipitor dose increased to 80 mg daily. Fall and aspiration precautions in place. Monitor on telemetry Transfer to [facility] for neurology evaluation.  Type 2 diabetes with hyperglycemia Obtain hemoglobin A1c Goal hemoglobin A1c less than 7.0.  Hyperlipidemia Prior to admission on Lipitor 40 mg daily and Zetia 10 mg daily Follow fasting lipid panel Goal LDL less than 70 Restarted home Zetia  Lipitor dose increased to 80 mg daily  Essential hypertension BPs are currently soft Continue to hold off home oral antihypertensives Started gentle IV fluid hydration LR KCl 20 mill equivalent at 100 cc/h x 1 day Monitor vital signs  Mild hypokalemia Serum potassium 3.4 Repleted intravenously with LR KCl 20 mill equivalent and 100 cc/h x 1 day. Repeat BMP in the morning and check magnesium level.  BPH History of malignant neoplasm of prostate, status post radiation treatment. Resume home Flomax Monitor urine output  Obesity BMI 31 Recommend weight loss outpatient with regular physical activity and healthy dieting.  Chronic HFpEF Last 2D echo done on 07/24/2017 revealed LVEF 60 to 65% with no regional wall motion abnormalities. Follow complete 2D echo Currently euvolemic on exam Closely monitor volume status while on IV fluid hydration Strict I's and O's and daily weight     DVT prophylaxis: Subcu Lovenox daily  Code Status: Full code  Family Communication: Family friend at bedside.  Disposition Plan: Admitted to telemetry medical unit at [facility]  Consults called: Neurology/stroke team  Admission status: Inpatient status.   Status is: Inpatient The patient will require at least 2  midnights for further evaluation and treatment of present condition.   Darlin Drop MD [facility] Pager [phone]  If 7PM-7AM, please contact night-coverage [email or web address] Password [facility]  10/08/2022, 7:57 PM

## 2022-10-09 ENCOUNTER — Inpatient Hospital Stay (HOSPITAL_COMMUNITY): Payer: Medicare Other

## 2022-10-09 DIAGNOSIS — I10 Essential (primary) hypertension: Secondary | ICD-10-CM | POA: Diagnosis not present

## 2022-10-09 DIAGNOSIS — G4733 Obstructive sleep apnea (adult) (pediatric): Secondary | ICD-10-CM

## 2022-10-09 DIAGNOSIS — I5032 Chronic diastolic (congestive) heart failure: Secondary | ICD-10-CM | POA: Diagnosis not present

## 2022-10-09 DIAGNOSIS — E669 Obesity, unspecified: Secondary | ICD-10-CM

## 2022-10-09 DIAGNOSIS — E782 Mixed hyperlipidemia: Secondary | ICD-10-CM

## 2022-10-09 DIAGNOSIS — I639 Cerebral infarction, unspecified: Secondary | ICD-10-CM

## 2022-10-09 DIAGNOSIS — E119 Type 2 diabetes mellitus without complications: Secondary | ICD-10-CM

## 2022-10-09 DIAGNOSIS — I6389 Other cerebral infarction: Secondary | ICD-10-CM | POA: Diagnosis not present

## 2022-10-09 LAB — GLUCOSE, CAPILLARY
Glucose-Capillary: 101 mg/dL — ABNORMAL HIGH (ref 70–99)
Glucose-Capillary: 79 mg/dL (ref 70–99)
Glucose-Capillary: 100 mg/dL — ABNORMAL HIGH (ref 70–99)

## 2022-10-09 LAB — ECHOCARDIOGRAM COMPLETE BUBBLE STUDY
AR max vel: 2.78 cm2
AV Area VTI: 2.81 cm2
AV Area mean vel: 2.79 cm2
AV Mean grad: 7 mmHg
AV Peak grad: 13.8 mmHg
Ao pk vel: 1.86 m/s
Area-P 1/2: 2.99 cm2
S' Lateral: 3.6 cm

## 2022-10-09 LAB — CBC
HCT: 37.7 % — ABNORMAL LOW (ref 39.0–52.0)
Hemoglobin: 12.5 g/dL — ABNORMAL LOW (ref 13.0–17.0)
MCH: 29.8 pg (ref 26.0–34.0)
MCHC: 33.2 g/dL (ref 30.0–36.0)
MCV: 90 fL (ref 80.0–100.0)
Platelets: 172 10*3/uL (ref 150–400)
RBC: 4.19 MIL/uL — ABNORMAL LOW (ref 4.22–5.81)
RDW: 12.7 % (ref 11.5–15.5)
WBC: 8.5 10*3/uL (ref 4.0–10.5)
nRBC: 0 % (ref 0.0–0.2)

## 2022-10-09 LAB — BASIC METABOLIC PANEL
Anion gap: 11 (ref 5–15)
BUN: 14 mg/dL (ref 8–23)
CO2: 25 mmol/L (ref 22–32)
Calcium: 9.3 mg/dL (ref 8.9–10.3)
Chloride: 102 mmol/L (ref 98–111)
Creatinine, Ser: 0.86 mg/dL (ref 0.61–1.24)
GFR, Estimated: >60 mL/min (ref >60)
Glucose, Bld: 85 mg/dL (ref 70–99)
Potassium: 3.8 mmol/L (ref 3.5–5.1)
Sodium: 138 mmol/L (ref 135–145)

## 2022-10-09 LAB — LIPID PANEL
Cholesterol: 80 mg/dL (ref 0–200)
HDL: 18 mg/dL — ABNORMAL LOW (ref >40)
LDL Cholesterol: 45 mg/dL (ref 0–99)
Total CHOL/HDL Ratio: 4.4 RATIO
Triglycerides: 85 mg/dL (ref <150)
VLDL: 17 mg/dL (ref 0–40)

## 2022-10-09 LAB — MAGNESIUM: Magnesium: 1.7 mg/dL (ref 1.7–2.4)

## 2022-10-09 LAB — HEMOGLOBIN A1C
Hgb A1c MFr Bld: 5.6 % (ref 4.8–5.6)
Mean Plasma Glucose: 114 mg/dL

## 2022-10-09 LAB — PHOSPHORUS: Phosphorus: 3.9 mg/dL (ref 2.5–4.6)

## 2022-10-09 MED ORDER — INSULIN ASPART 100 UNIT/ML IJ SOLN: 0.0000 [IU] | Freq: Three times a day (TID) | INTRAMUSCULAR | Status: DC

## 2022-10-09 MED ORDER — INSULIN GLARGINE-YFGN 100 UNIT/ML ~~LOC~~ SOLN
20.0000 [IU] | Freq: Every day | SUBCUTANEOUS | Status: DC
  Administered 2022-10-09 – 2022-10-10 (×2): 20 [IU] via SUBCUTANEOUS
  Filled 2022-10-09 (×2): qty 0.2

## 2022-10-09 MED ORDER — INSULIN ASPART 100 UNIT/ML IJ SOLN: 0.0000 [IU] | Freq: Every day | INTRAMUSCULAR | Status: DC

## 2022-10-09 NOTE — Assessment & Plan Note (Addendum)
MRI brain showed acute infarcts in the bilateral ACA territory, left-greater-than-right involving the parasagittal bilateral parietal lobes - Non-invasive angiography showed narrowing of A3, no LVO, carotids unremarkable - Echocardiogram and dopplers pending - Lipids ordered: LDL 45, continue home atorvastatin - Aspirin and PLavix ordered at admission - Atrial fibrillation: not present on telemetry, no history, ILR pending - tPA not given because outside window - Dysphagia screen ordered in ER - PT eval ordered: CIR - Nonsmoker

## 2022-10-09 NOTE — Plan of Care (Signed)
  Problem: Education: Goal: Knowledge of General Education information will improve Description Including pain rating scale, medication(s)/side effects and non-pharmacologic comfort measures Outcome: Progressing   Problem: Health Behavior/Discharge Planning: Goal: Ability to manage health-related needs will improve Outcome: Progressing   

## 2022-10-09 NOTE — Assessment & Plan Note (Signed)
BP low normal - Allow permissive HTN - Hold ACEi, chlorthal

## 2022-10-09 NOTE — Evaluation (Signed)
Physical Therapy Evaluation Patient Details Name: Kenneth [name] MRN: [id number] DOB: 11-Aug-1950 Today's Date: 10/09/2022  History of Present Illness  Pt is a 72 y.o. 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 He presented to the ED from home after a fall with c/o BLE weakness. PMH: congenital hearing loss, DM, HTN, hyperlipidemia, sleep apnea, prostate cancer   Clinical Impression  Pt admitted with above diagnosis. PTA pt active and independent. His wife has been hospitalized x 7 weeks (acute care to [facility]). Pt currently with functional limitations due to the deficits listed below (see PT Problem List). On eval, he required min assist transfer, and min assist amb 50' with RW. He demonstrates BLE weakness (L>R), decreased balance, and decreased activity tolerance. Mildly labile with a few episodes of tearfulness then inappropriately laughing. Difficult to discern dysarthria from his baseline speech pattern related to congenital hearing loss. Pt will benefit from acute skilled PT to increase their independence and safety with mobility to allow discharge.  Pt is very motivated. He has excellent rehab potential for returning to independent level and would greatly benefit from intensive rehab > 3 hours/day.        Recommendations for follow up therapy are one component of a multi-disciplinary discharge planning process, led by the attending physician.  Recommendations may be updated based on patient status, additional functional criteria and insurance authorization.  Follow Up Recommendations       Assistance Recommended at Discharge Intermittent Supervision/Assistance  Patient can return home with the following  Assistance with cooking/housework;Assist for transportation;Help with stairs or ramp for entrance;A little help with bathing/dressing/bathroom;A little help with walking and/or transfers    Equipment Recommendations Rolling walker (2 wheels)  Recommendations for Other Services  Rehab consult     Functional Status Assessment Patient has had a recent decline in their functional status and demonstrates the ability to make significant improvements in function in a reasonable and predictable amount of time.     Precautions / Restrictions Precautions Precautions: Fall      Mobility  Bed Mobility               General bed mobility comments: Pt up in recliner.    Transfers Overall transfer level: Needs assistance Equipment used: Rolling walker (2 wheels) Transfers: Sit to/from Stand Sit to Stand: Min assist           General transfer comment: cues for hand placement. Assist to power up and stabilize balance.    Ambulation/Gait Ambulation/Gait assistance: Min assist Gait Distance (Feet): 50 Feet Assistive device: Rolling walker (2 wheels) Gait Pattern/deviations: Step-through pattern, Narrow base of support, Decreased stride length, Drifts right/left Gait velocity: decreased Gait velocity interpretation: 1.31 - 2.62 ft/sec, indicative of limited community ambulator   General Gait Details: slow, guarded gait. Amb initially with RW min guard straght level surface and min assist turning, managing obstacles. Short gait trial in room without AD: pt reaching for UE support, shuffle steps, unsteady  Stairs            Wheelchair Mobility    Modified Rankin (Stroke Patients Only) Modified Rankin (Stroke Patients Only) Pre-Morbid Rankin Score: No symptoms Modified Rankin: Moderately severe disability     Balance Overall balance assessment: Needs assistance Sitting-balance support: No upper extremity supported, Feet supported Sitting balance-Leahy Scale: Good     Standing balance support: During functional activity, No upper extremity supported Standing balance-Leahy Scale: Poor Standing balance comment: reliant on external support. Improved stability with RW/UE support  Pertinent Vitals/Pain Pain Assessment Pain  Assessment: No/denies pain    Home Living Family/patient expects to be discharged to:: Private residence Living Arrangements: Spouse/significant other ([place] currently [facility] in [facility]) Available Help at Discharge: Available PRN/intermittently;Family Type of Home: House Home Access: Stairs to enter Entrance Stairs-Rails: Can reach both;Left;Right Entrance Stairs-Number of Steps: 2   Home Layout: Able to live on main level with bedroom/bathroom Home Equipment: BSC/3in1;Shower seat - built Char[employer] fundraiser (2 wheels);Transport chair      Prior Function Prior Level of Function : Independent/Modified Independent;Driving                     Hand Dominance   Dominant Hand: Right    Extremity/Trunk Assessment   Upper Extremity Assessment Upper Extremity Assessment: Defer to OT evaluation    Lower Extremity Assessment Lower Extremity Assessment: RLE deficits/detail;LLE deficits/detail RLE Deficits / Details: 4+/5 RLE Sensation: decreased proprioception LLE Deficits / Details: 4/5 LLE Sensation: decreased proprioception    Cervical / Trunk Assessment Cervical / Trunk Assessment: Normal  Communication   Communication: HOH (B hearing aids)  Cognition Arousal/Alertness: Awake/alert Behavior During Therapy: WFL for tasks assessed/performed Overall Cognitive Status: Within Functional Limits for tasks assessed                                 General Comments: Mildly labile, tearful to inappropriately laughing        General Comments General comments (skin integrity, edema, etc.): VSS on RA. Edema noted bilat distal LE. Pt reports this is a chronic issue.    Exercises     Assessment/Plan    PT Assessment Patient needs continued PT services  PT Problem List Decreased balance;Decreased strength;Decreased mobility;Decreased knowledge of use of DME;Decreased activity tolerance       PT Treatment Interventions DME instruction;Functional mobility  training;Balance training;Patient/family education;Gait training;Therapeutic activities;Stair training;Therapeutic exercise;Neuromuscular re-education    PT Goals (Current goals can be found in the Care Plan section)  Acute Rehab PT Goals Patient Stated Goal: home, independence PT Goal Formulation: With patient Time For Goal Achievement: 10/23/22 Potential to Achieve Goals: Good    Frequency Min 4X/week     Co-evaluation               AM-PAC PT "6 Clicks" Mobility  Outcome Measure Help needed turning from your back to your side while in a flat bed without using bedrails?: None Help needed moving from lying on your back to sitting on the side of a flat bed without using bedrails?: A Little Help needed moving to and from a bed to a chair (including a wheelchair)?: A Little Help needed standing up from a chair using your arms (e.g., wheelchair or bedside chair)?: A Little Help needed to walk in hospital room?: A Little Help needed climbing 3-5 steps with a railing? : A Lot 6 Click Score: 18    End of Session Equipment Utilized During Treatment: Gait belt Activity Tolerance: Patient tolerated treatment well Patient left: in chair;with call bell/phone within reach Nurse Communication: Mobility status PT Visit Diagnosis: Unsteadiness on feet (R26.81);Difficulty in walking, not elsewhere classified (R26.2)    Time: 2952-8413 PT Time Calculation (min) (ACUTE ONLY): 22 min   Charges:   PT Evaluation $PT Eval Moderate Complexity: 1 Mod          Ferd Glassing., PT  Office # [phone]   Ilda Foil 10/09/2022, 8:43 AM

## 2022-10-09 NOTE — Progress Notes (Signed)
  Inpatient Rehabilitation Admissions Coordinator   Met with patient at bedside for rehab assessment. We discussed goals and expectations of a possible CIR admit.  He worked as a IT trainer prior to admit and was independent. Has no caregiver supports and will need to reach Mod I level to discharge home. Wife is currently a patient at [facility]. He prefers CIR admit.  I feel he can reach Mod I level . I will follow up in the am after discussion with rehab MD.Please call me with any questions.   Ottie Glazier, RN, MSN Rehab Admissions Coordinator [phone]

## 2022-10-09 NOTE — Progress Notes (Addendum)
Transition of Care [facility]) - Inpatient Brief Assessment   Patient Details  Name: Kenneth [name] MRN: [id number] Date of Birth: May 05, 1951  [facility]) CM/SW Contact:    Kermit Balo, RN Phone Number: 10/09/2022, 1:58 PM   Clinical Narrative: Pt is s/p stroke. Current recommendations are for CIR. Awaiting CIR work up.   Transition of Care Asessment: Insurance and Status: Insurance coverage has been reviewed Patient has primary care physician: Yes Home environment has been reviewed: wife currently in [place] Prior level of function:: independent Prior/Current Home Services: No current home services Social Determinants of Health Reivew: SDOH reviewed no interventions necessary Readmission risk has been reviewed: Yes Transition of care needs: transition of care needs identified, TOC will continue to follow

## 2022-10-09 NOTE — Progress Notes (Addendum)
STROKE TEAM PROGRESS NOTE   SUBJECTIVE (INTERVAL HISTORY) No family is at the bedside.  Overall his condition is rapidly improving.  He stated that he felt bilateral lower extremity weakness more on the left, however, currently much improved.  Discussed with further cardioembolic workup to include loop recorder.  Patient is in agreement.   OBJECTIVE Temp:  [97.6 F (36.4 C)-98.4 F (36.9 C)] 98.3 F (36.8 C) (05/30 1633) Pulse Rate:  [59-68] 60 (05/30 1633) Cardiac Rhythm: Normal sinus rhythm (05/30 1900) Resp:  [18] 18 (05/30 1633) BP: (106-149)/(50-69) 119/59 (05/30 1633) SpO2:  [93 %-97 %] 95 % (05/30 1633)  Recent Labs  Lab 10/08/22 1544 10/09/22 1225 10/09/22 1628  GLUCAP 102* 101* 79   Recent Labs  Lab 10/08/22 1642 10/09/22 0625  NA 137 138  K 3.4* 3.8  CL 100 102  CO2 28 25  GLUCOSE 96 85  BUN 22 14  CREATININE 0.90 0.86  CALCIUM 9.7 9.3  MG  --  1.7  PHOS  --  3.9   Recent Labs  Lab 10/08/22 1642  AST 17  ALT 16  ALKPHOS 51  BILITOT 1.5*  PROT 7.1  ALBUMIN 3.8   Recent Labs  Lab 10/08/22 1642 10/09/22 0625  WBC 9.3 8.5  NEUTROABS 6.1  --   HGB 12.4* 12.5*  HCT 37.9* 37.7*  MCV 90.5 90.0  PLT 180 172   No results for input(s): "CKTOTAL", "CKMB", "CKMBINDEX", "TROPONINI" in the last 168 hours. No results for input(s): "LABPROT", "INR" in the last 72 hours. Recent Labs    10/08/22 1757  COLORURINE YELLOW  LABSPEC 1.009  PHURINE 6.0  GLUCOSEU NEGATIVE  HGBUR NEGATIVE  BILIRUBINUR NEGATIVE  KETONESUR NEGATIVE  PROTEINUR NEGATIVE  NITRITE NEGATIVE  LEUKOCYTESUR NEGATIVE       Component Value Date/Time   CHOL 80 10/09/2022 0625   CHOL 159 02/10/2017 1307   TRIG 85 10/09/2022 0625   HDL 18 (L) 10/09/2022 0625   HDL 67 02/10/2017 1307   CHOLHDL 4.4 10/09/2022 0625   VLDL 17 10/09/2022 0625   LDLCALC 45 10/09/2022 0625   LDLCALC 58 02/10/2017 1307   Lab Results  Component Value Date   HGBA1C 8.1 (H) 07/25/2017   No results  found for: "LABOPIA", "COCAINSCRNUR", "LABBENZ", "AMPHETMU", "THCU", "LABBARB"  No results for input(s): "ETH" in the last 168 hours.  I have personally reviewed the radiological images below and agree with the radiology interpretations.  VAS [place] LOWER EXTREMITY VENOUS (DVT)  Result Date: 10/09/2022  Lower Venous DVT Study Patient Name:  PRIYAM [name]  Date of Exam:   10/09/2022 Medical Rec #: [id number]         Accession #:    [id number] Date of Birth: [birth year]         Patient Gender: M Patient Age:   72 years Exam Location:  [facility] Procedure:      VAS [place] LOWER EXTREMITY VENOUS (DVT) Referring Phys: Scheryl Marten Brailon Don --------------------------------------------------------------------------------  Indications: Stroke.  Comparison Study: No prior studies. Performing Technologist: Jean Rosenthal RDMS, RVT  Examination Guidelines: A complete evaluation includes B-mode imaging, spectral Doppler, color Doppler, and power Doppler as needed of all accessible portions of each vessel. Bilateral testing is considered an integral part of a complete examination. Limited examinations for reoccurring indications may be performed as noted. The reflux portion of the exam is performed with the patient in reverse Trendelenburg.  +---------+---------------+---------+-----------+----------+--------------+ RIGHT    CompressibilityPhasicitySpontaneityPropertiesThrombus Aging +---------+---------------+---------+-----------+----------+--------------+ CFV  Full           Yes      Yes                                 +---------+---------------+---------+-----------+----------+--------------+ SFJ      Full                                                        +---------+---------------+---------+-----------+----------+--------------+ FV Prox  Full                                                        +---------+---------------+---------+-----------+----------+--------------+ FV Mid   Full                                                         +---------+---------------+---------+-----------+----------+--------------+ FV DistalFull                                                        +---------+---------------+---------+-----------+----------+--------------+ PFV      Full                                                        +---------+---------------+---------+-----------+----------+--------------+ POP      Full           Yes      Yes                                 +---------+---------------+---------+-----------+----------+--------------+ PTV      Full                                                        +---------+---------------+---------+-----------+----------+--------------+ PERO     Full                                                        +---------+---------------+---------+-----------+----------+--------------+ Gastroc  Full                                                        +---------+---------------+---------+-----------+----------+--------------+   +---------+---------------+---------+-----------+----------+--------------+  LEFT     CompressibilityPhasicitySpontaneityPropertiesThrombus Aging +---------+---------------+---------+-----------+----------+--------------+ CFV      Full           Yes      Yes                                 +---------+---------------+---------+-----------+----------+--------------+ SFJ      Full                                                        +---------+---------------+---------+-----------+----------+--------------+ FV Prox  Full                                                        +---------+---------------+---------+-----------+----------+--------------+ FV Mid   Full                                                        +---------+---------------+---------+-----------+----------+--------------+ FV DistalFull                                                         +---------+---------------+---------+-----------+----------+--------------+ PFV      Full                                                        +---------+---------------+---------+-----------+----------+--------------+ POP      Full           Yes      Yes                                 +---------+---------------+---------+-----------+----------+--------------+ PTV      Full                                                        +---------+---------------+---------+-----------+----------+--------------+ PERO     Full                                                        +---------+---------------+---------+-----------+----------+--------------+ Gastroc  Full                                                        +---------+---------------+---------+-----------+----------+--------------+  Summary: RIGHT: - There is no evidence of deep vein thrombosis in the lower extremity.  - No cystic structure found in the popliteal fossa.  LEFT: - There is no evidence of deep vein thrombosis in the lower extremity.  - No cystic structure found in the popliteal fossa.  *See table(s) above for measurements and observations. Electronically signed by Heath Lark on 10/09/2022 at 5:38:22 PM.    Final    ECHOCARDIOGRAM COMPLETE BUBBLE STUDY  Result Date: 10/09/2022    ECHOCARDIOGRAM REPORT   Patient Name:   ALYAAN OCASIO Date of Exam: 10/09/2022 Medical Rec #:  [id number]        Height:       71.0 in Accession #:    [id number]       Weight:       222.7 lb Date of Birth:  Sep 10, 1950        BSA:          2.207 m Patient Age:    71 years         BP:           111/59 mmHg Patient Gender: M                HR:           62 bpm. Exam Location:  Inpatient Procedure: 2D Echo, Cardiac Doppler, Color Doppler and Saline Contrast Bubble            Study Indications:    Stroke  History:        Patient has prior history of Echocardiogram examinations. CHF,                 Abnormal ECG, Stroke and  Pulmonary HTN, Signs/Symptoms:Syncope;                 Risk Factors:Diabetes, Dyslipidemia, Sleep Apnea and                 Hypertension. Cancer.  Sonographer:    Sheralyn Boatman RDCS Referring Phys: [id number] CAROLE N HALL IMPRESSIONS  1. Left ventricular ejection fraction, by estimation, is 55 to 60%. The left ventricle has normal function. The left ventricle has no regional wall motion abnormalities. Left ventricular diastolic parameters are consistent with Grade I diastolic dysfunction (impaired relaxation).  2. Right ventricular systolic function is normal. The right ventricular size is normal. There is normal pulmonary artery systolic pressure. The estimated right ventricular systolic pressure is 29.4 mmHg.  3. The mitral valve is normal in structure. No evidence of mitral valve regurgitation. No evidence of mitral stenosis.  4. The aortic valve is tricuspid. There is moderate calcification of the aortic valve. There is moderate thickening of the aortic valve. Aortic valve regurgitation is not visualized. Aortic valve sclerosis/calcification is present, without any evidence of aortic stenosis.  5. The inferior vena cava is normal in size with greater than 50% respiratory variability, suggesting right atrial pressure of 3 mmHg.  6. Agitated saline contrast bubble study was negative, with no evidence of any interatrial shunt. Comparison(s): No significant change from prior study. Conclusion(s)/Recommendation(s): No intracardiac source of embolism detected on this transthoracic study. Consider a transesophageal echocardiogram to exclude cardiac source of embolism if clinically indicated. FINDINGS  Left Ventricle: Left ventricular ejection fraction, by estimation, is 55 to 60%. The left ventricle has normal function. The left ventricle has no regional wall motion abnormalities. The left ventricular internal cavity size was normal in size. There is  no left ventricular hypertrophy.  Left ventricular diastolic parameters are  consistent with Grade I diastolic dysfunction (impaired relaxation). Right Ventricle: The right ventricular size is normal. No increase in right ventricular wall thickness. Right ventricular systolic function is normal. There is normal pulmonary artery systolic pressure. The tricuspid regurgitant velocity is 2.57 m/s, and  with an assumed right atrial pressure of 3 mmHg, the estimated right ventricular systolic pressure is 29.4 mmHg. Left Atrium: Left atrial size was normal in size. Right Atrium: Right atrial size was normal in size. Pericardium: There is no evidence of pericardial effusion. Mitral Valve: The mitral valve is normal in structure. No evidence of mitral valve regurgitation. No evidence of mitral valve stenosis. Tricuspid Valve: The tricuspid valve is normal in structure. Tricuspid valve regurgitation is mild. Aortic Valve: The aortic valve is tricuspid. There is moderate calcification of the aortic valve. There is moderate thickening of the aortic valve. Aortic valve regurgitation is not visualized. Aortic valve sclerosis/calcification is present, without any  evidence of aortic stenosis. Aortic valve mean gradient measures 7.0 mmHg. Aortic valve peak gradient measures 13.8 mmHg. Aortic valve area, by VTI measures 2.81 cm. Pulmonic Valve: The pulmonic valve was normal in structure. Pulmonic valve regurgitation is trivial. Aorta: The aortic root is normal in size and structure. Venous: The inferior vena cava is normal in size with greater than 50% respiratory variability, suggesting right atrial pressure of 3 mmHg. IAS/Shunts: The atrial septum is grossly normal. Agitated saline contrast was given intravenously to evaluate for intracardiac shunting. Agitated saline contrast bubble study was negative, with no evidence of any interatrial shunt.  LEFT VENTRICLE PLAX 2D LVIDd:         5.60 cm   Diastology LVIDs:         3.60 cm   LV e' medial:    10.90 cm/s LV PW:         0.80 cm   LV E/e' medial:  7.9 LV  IVS:        1.00 cm   LV e' lateral:   14.90 cm/s LVOT diam:     2.40 cm   LV E/e' lateral: 5.8 LV SV:         114 LV SV Index:   52 LVOT Area:     4.52 cm  RIGHT VENTRICLE             IVC RV S prime:     14.30 cm/s  IVC diam: 2.00 cm TAPSE (M-mode): 1.6 cm LEFT ATRIUM             Index        RIGHT ATRIUM          Index LA diam:        3.80 cm 1.72 cm/m   RA Area:     8.98 cm LA Vol (A2C):   28.0 ml 12.69 ml/m  RA Volume:   13.00 ml 5.89 ml/m LA Vol (A4C):   23.4 ml 10.60 ml/m LA Biplane Vol: 26.1 ml 11.83 ml/m  AORTIC VALVE AV Area (Vmax):    2.78 cm AV Area (Vmean):   2.79 cm AV Area (VTI):     2.81 cm AV Vmax:           185.50 cm/s AV Vmean:          122.500 cm/s AV VTI:            0.406 m AV Peak Grad:      13.8 mmHg AV Mean Grad:  7.0 mmHg LVOT Vmax:         114.00 cm/s LVOT Vmean:        75.600 cm/s LVOT VTI:          0.252 m LVOT/AV VTI ratio: 0.62  AORTA Ao Root diam: 3.40 cm Ao Asc diam:  3.50 cm MITRAL VALVE               TRICUSPID VALVE MV Area (PHT): 2.99 cm    TR Peak grad:   26.4 mmHg MV Decel Time: 254 msec    TR Vmax:        257.00 cm/s MV E velocity: 86.50 cm/s MV A velocity: 85.20 cm/s  SHUNTS MV E/A ratio:  1.02        Systemic VTI:  0.25 m                            Systemic Diam: 2.40 cm Laurance Flatten MD Electronically signed by Laurance Flatten MD Signature Date/Time: 10/09/2022/2:56:29 PM    Final    CT ANGIO HEAD NECK W WO CM  Result Date: 10/08/2022 CLINICAL DATA:  Acute infarcts in the medial parietal lobe bilaterally. EXAM: CT ANGIOGRAPHY HEAD AND NECK WITH AND WITHOUT CONTRAST TECHNIQUE: Multidetector CT imaging of the head and neck was performed using the standard protocol during bolus administration of intravenous contrast. Multiplanar CT image reconstructions and MIPs were obtained to evaluate the vascular anatomy. Carotid stenosis measurements (when applicable) are obtained utilizing NASCET criteria, using the distal internal carotid diameter as the  denominator. RADIATION DOSE REDUCTION: This exam was performed according to the departmental dose-optimization program which includes automated exposure control, adjustment of the mA and/or kV according to patient size and/or use of iterative reconstruction technique. CONTRAST:  75mL OMNIPAQUE IOHEXOL 350 MG/ML SOLN COMPARISON:  No prior CTA available, correlation is made with 10/08/2022 MRI head and 07/23/2017 CT head FINDINGS: CT HEAD FINDINGS Brain: No CT correlate is seen for the small acute infarcts noted on the same-day MRI. No evidence of additional acute infarct, hemorrhage, mass, mass effect, or midline shift. No hydrocephalus or extra-axial fluid collection. Vascular: No hyperdense vessel. Skull: Negative for fracture or focal lesion. Sinuses/Orbits: Mucosal thickening in the ethmoid air cells. No acute finding in the orbits. Other: The mastoid air cells are well aerated. CTA NECK FINDINGS Aortic arch: Two-vessel arch with a common origin of the brachiocephalic and left common carotid arteries. Imaged portion shows no evidence of aneurysm or dissection. No significant stenosis of the major arch vessel origins. Right carotid system: No evidence of dissection, occlusion, or hemodynamically significant stenosis (greater than 50%). Left carotid system: No evidence of dissection, occlusion, or hemodynamically significant stenosis (greater than 50%). Vertebral arteries: No evidence of dissection, occlusion, or hemodynamically significant stenosis (greater than 50%). Skeleton: No acute osseous abnormality. Status post ACDF C5-C7 and posterior fusion C4-C7. Other neck: Redemonstrated 19 mm right thyroid nodule, which appears unchanged on exams dating back to 2014, favored to be benign. Patent Upper chest: No focal pulmonary opacity or pleural effusion. Review of the MIP images confirms the above findings CTA HEAD FINDINGS Anterior circulation: Both internal carotid arteries are patent to the termini, without  significant stenosis. Left A1. Aplastic right A1. Normal anterior communicating artery. Asymmetrically narrow right A3 (series 14, images 231-232), which could represent stenosis versus asymmetric branching. Anterior cerebral arteries are otherwise patent to their distal aspects without significant stenosis. No M1 stenosis or occlusion. MCA branches  perfused to their distal aspects without significant stenosis. Posterior circulation: Vertebral arteries patent to the vertebrobasilar junction without significant stenosis. Posterior inferior cerebellar arteries patent proximally. Basilar patent to its distal aspect without significant stenosis. Superior cerebellar arteries patent proximally. Patent P1 segments. PCAs perfused to their distal aspects without significant stenosis. The bilateral posterior communicating arteries are not visualized. Venous sinuses: Well opacified, patent. Anatomic variants: None significant. Review of the MIP images confirms the above findings IMPRESSION: 1. No intracranial large vessel occlusion. Asymmetrically narrow right A3, which could represent stenosis versus asymmetric branching. 2. No hemodynamically significant stenosis in the neck. Electronically Signed   By: Wiliam Ke M.D.   On: 10/08/2022 23:18   MR Brain Wo Contrast (neuro protocol)  Result Date: 10/08/2022 CLINICAL DATA:  Neuro deficit, acute, stroke suspected EXAM: MRI HEAD WITHOUT CONTRAST TECHNIQUE: Multiplanar, multiecho pulse sequences of the brain and surrounding structures were obtained without intravenous contrast. COMPARISON:  CT Head 07/23/17 FINDINGS: Brain: There are acute infarcts in the bilateral ACA territory, left-greater-than-right involving the parasagittal bilateral parietal lobes (series 9, image 30-27). No hemorrhage. No extra-axial fluid collection. No hydrocephalus. Sequela of mild overall chronic microvascular ischemic change. No mass effect. No midline shift. Vascular: Normal flow voids. Skull  and upper cervical spine: Normal marrow signal. Sinuses/Orbits: No mastoid or middle ear effusion. Paranasal sinuses are notable for mucosal thickening in the bilateral ethmoid sinuses. Orbits are unremarkable. Other: None. IMPRESSION: Acute bilateral ACA territory infarcts (left-greater-than-right) involving the parasagittal aspects of the bilateral parietal lobes. No hemorrhage. Electronically Signed   By: Lorenza Cambridge M.D.   On: 10/08/2022 18:31     PHYSICAL EXAM  Temp:  [97.6 F (36.4 C)-98.4 F (36.9 C)] 98.3 F (36.8 C) (05/30 1633) Pulse Rate:  [59-68] 60 (05/30 1633) Resp:  [18] 18 (05/30 1633) BP: (106-149)/(50-69) 119/59 (05/30 1633) SpO2:  [93 %-97 %] 95 % (05/30 1633)  General - Well nourished, well developed, in no apparent distress.  Ophthalmologic - fundi not visualized due to noncooperation.  Cardiovascular - Regular rhythm and rate.  Mental Status -  Level of arousal and orientation to time, place, and person were intact. Language including expression, naming, repetition, comprehension was assessed and found intact. Fund of Knowledge was assessed and was intact.  Cranial Nerves II - XII - II - Visual field intact OU. III, IV, VI - Extraocular movements intact. V - Facial sensation intact bilaterally. VII - Facial movement intact bilaterally. VIII - Hearing & vestibular intact bilaterally. X - Palate elevates symmetrically. XI - Chin turning & shoulder shrug intact bilaterally. XII - Tongue protrusion intact  Motor Strength - The patient's strength was normal in all extremities and pronator drift was absent.  Bulk was normal and fasciculations were absent.   Motor Tone - Muscle tone was assessed at the neck and appendages and was normal.  Reflexes - The patient's reflexes were symmetrical in all extremities and he had no pathological reflexes.  Sensory - Light touch, temperature/pinprick were assessed and were symmetrical.    Coordination - The patient had  normal movements in the hands and feet with no ataxia or dysmetria.  Tremor was absent.  Gait and Station - deferred.   ASSESSMENT/PLAN [name] is a 72 y.o. male with history of diabetes, hypertension, hyperlipidemia, OSA, prostate cancer admitted for difficulty walking. No tPA given due to outside window.    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 Patient counseled to be compliant with his antithrombotic medications Ongoing aggressive stroke risk factor management Therapy recommendations: CIR Disposition: Pending  Hypertension Stable Long term BP goal normotensive  Hyperlipidemia Home meds: Lipitor 40 and Zetia 10 LDL 45, goal < 70 Now on Lipitor 40 and Zetia 10 Continue statin at discharge  Other Stroke Risk Factors Advanced age Obesity, Body mass index is 31.06 kg/m.  Obstructive sleep apnea   Other Active Problems Prostate cancer  Hospital day # 1  I spent additional 30 inpatient minutes in total face-to-face time with the patient, more than 50% of which was spent in counseling and coordination of care, reviewing test results, images and medication, and discussing the diagnosis, treatment plan and potential prognosis. This patient's care requiresreview of multiple databases, neurological assessment, discussion with family, other specialists and medical decision making of high complexity.   Marvel Plan, MD PhD Stroke Neurology 10/09/2022 8:01 PM    To contact Stroke Continuity  provider, please refer to [email or web address]. After hours, contact General Neurology

## 2022-10-09 NOTE — Assessment & Plan Note (Signed)
-  Continue Lipitor °

## 2022-10-09 NOTE — Plan of Care (Signed)
  Problem: Education: Goal: Knowledge of General Education information will improve Description: Including pain rating scale, medication(s)/side effects and non-pharmacologic comfort measures Outcome: Progressing   Problem: Health Behavior/Discharge Planning: Goal: Ability to manage health-related needs will improve Outcome: Progressing   Problem: Clinical Measurements: Goal: Ability to maintain clinical measurements within normal limits will improve Outcome: Progressing Goal: Will remain free from infection Outcome: Progressing Goal: Diagnostic test results will improve Outcome: Progressing Goal: Respiratory complications will improve Outcome: Progressing Goal: Cardiovascular complication will be avoided Outcome: Progressing   Problem: Activity: Goal: Risk for activity intolerance will decrease Outcome: Progressing   Problem: Nutrition: Goal: Adequate nutrition will be maintained Outcome: Progressing   Problem: Coping: Goal: Level of anxiety will decrease Outcome: Progressing   Problem: Elimination: Goal: Will not experience complications related to bowel motility Outcome: Progressing Goal: Will not experience complications related to urinary retention Outcome: Progressing   Problem: Pain Managment: Goal: General experience of comfort will improve Outcome: Progressing   Problem: Safety: Goal: Ability to remain free from injury will improve Outcome: Progressing   Problem: Skin Integrity: Goal: Risk for impaired skin integrity will decrease Outcome: Progressing   Problem: Education: Goal: Knowledge of disease or condition will improve Outcome: Progressing Goal: Knowledge of secondary prevention will improve (MUST DOCUMENT ALL) Outcome: Progressing Goal: Knowledge of patient specific risk factors will improve (Orazio N/A or DELETE if not current risk factor) Outcome: Progressing   Problem: Ischemic Stroke/TIA Tissue Perfusion: Goal: Complications of ischemic  stroke/TIA will be minimized Outcome: Progressing   Problem: Coping: Goal: Will verbalize positive feelings about self Outcome: Progressing Goal: Will identify appropriate support needs Outcome: Progressing   Problem: Health Behavior/Discharge Planning: Goal: Ability to manage health-related needs will improve Outcome: Progressing Goal: Goals will be collaboratively established with patient/family Outcome: Progressing   Problem: Self-Care: Goal: Ability to participate in self-care as condition permits will improve Outcome: Progressing Goal: Verbalization of feelings and concerns over difficulty with self-care will improve Outcome: Progressing Goal: Ability to communicate needs accurately will improve Outcome: Progressing   Problem: Nutrition: Goal: Risk of aspiration will decrease Outcome: Progressing Goal: Dietary intake will improve Outcome: Progressing   Problem: Education: Goal: Ability to describe self-care measures that may prevent or decrease complications (Diabetes Survival Skills Education) will improve Outcome: Progressing Goal: Individualized Educational Video(s) Outcome: Progressing   Problem: Coping: Goal: Ability to adjust to condition or change in health will improve Outcome: Progressing   Problem: Fluid Volume: Goal: Ability to maintain a balanced intake and output will improve Outcome: Progressing   Problem: Health Behavior/Discharge Planning: Goal: Ability to identify and utilize available resources and services will improve Outcome: Progressing Goal: Ability to manage health-related needs will improve Outcome: Progressing   Problem: Metabolic: Goal: Ability to maintain appropriate glucose levels will improve Outcome: Progressing   Problem: Nutritional: Goal: Maintenance of adequate nutrition will improve Outcome: Progressing Goal: Progress toward achieving an optimal weight will improve Outcome: Progressing   Problem: Skin  Integrity: Goal: Risk for impaired skin integrity will decrease Outcome: Progressing   Problem: Tissue Perfusion: Goal: Adequacy of tissue perfusion will improve Outcome: Progressing   

## 2022-10-09 NOTE — Evaluation (Signed)
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   History of colon polyps - adenomas 04/03/2010   Hyperlipidemia    Hypertension    OSA (obstructive sleep apnea)    refusing CPAP   Prostate cancer (HCC)    Past Surgical History:  Past Surgical History:  Procedure Laterality Date   ANTERIOR CERVICAL DECOMP/DISCECTOMY FUSION N/A 10/01/2012   Procedure: ANTERIOR CERVICAL DECOMPRESSION/DISCECTOMY FUSION 2 LEVELS;  Surgeon: Tia Alert, MD;  Location: [facility];  Service: Neurosurgery;  Laterality: N/A;  Cervical five-six,Cervical six-seven   COLONOSCOPY  09/30/2010, 03/01/2014   diverticulosis, small internal hemorrhoids   COLONOSCOPY W/ POLYPECTOMY  04/03/2010   4 polyps 8-34mm, worst TV adenoma with high-grade dysplasia   NO PAST SURGERIES     POLYPECTOMY     POSTERIOR CERVICAL FUSION/FORAMINOTOMY Left 10/20/2012   Procedure: C/5-6,C/6-7 Lami/Multi level,POSTERIOR CERVICAL FUSION/FORAMINOTOMY C/4-7;  Surgeon: Tia Alert, MD;  Location: [facility];  Service: Neurosurgery;  Laterality: Left;  Cervical Five-Six, Cervical Six-Seven Laminectomies, Posterior Cervical Fusion/Foraminotomies Cervical Four through Seven.    TONSILLECTOMY     HPI:  Pt is a 72 y.o. 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 He presented to the ED from home after a fall with c/o BLE weakness. PMH: congenital hearing loss, DM, HTN, hyperlipidemia, sleep apnea, prostate cancer.    Assessment / Plan / Recommendation  Clinical Impression  Pt was seen for a bedside swallow evaluation in the setting of bilateral ACA infarcts and he presents with suspected  functional oropharyngeal swallowing abilities.  Pt was encountered awake/alert in chair and he was pleasant and cooperative throughout this evaluation.  Oral mechanism exam was WNL.  Pt consumed trials of thin liquid and regular solids.  Pt fed himself independently and he demonstrated good bolus acceptance, timely mastication, suspected timely AP transport/swallow initiation, and consistent hyolaryngeal elevation/excursion to observation and palpation.  No overt s/sx of aspiration were observed with any trials.  Recommend continuation of regular solids and thin liquids with medications administered whole with liquid.  No further skilled ST is warranted at this time.  Please re-consult if additional needs arise.    SLP Visit Diagnosis: Dysphagia, unspecified (R13.10)    Aspiration Risk  No limitations    Diet Recommendation Regular;Thin liquid   Liquid Administration via: Cup;Straw Medication Administration: Whole meds with liquid Supervision: Patient able to self feed    Other  Recommendations Oral Care Recommendations: Oral care BID    Recommendations for follow up therapy are one component of a multi-disciplinary discharge planning process, led by the attending physician.  Recommendations may be updated based on patient status, additional functional criteria and insurance authorization.  Follow up Recommendations No SLP follow up      Assistance Recommended at Discharge    Functional Status Assessment Patient has had a recent decline in their functional status and demonstrates the ability to make significant improvements in function in a reasonable and predictable amount of time.  Frequency and Duration            Prognosis Prognosis for improved oropharyngeal function: Good  Swallow Study   General HPI: Pt is a 72 y.o. 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 He presented to the ED from home after a fall with c/o BLE weakness. PMH: congenital hearing loss, DM, HTN, hyperlipidemia,  sleep apnea, prostate cancer. Type of Study: Bedside Swallow Evaluation Previous Swallow Assessment: N/A Diet Prior to this Study: Regular;Thin liquids (Level 0) Temperature Spikes Noted: No Respiratory Status: Room air History of Recent Intubation: No Behavior/Cognition: Alert;Cooperative;Pleasant mood Oral Cavity Assessment: Within Functional Limits Oral Care Completed by SLP: No Oral Cavity - Dentition: Adequate natural dentition Vision: Functional for self-feeding Self-Feeding Abilities: Able to feed self Patient Positioning: Upright in bed Baseline Vocal Quality: Normal Volitional Swallow: Able to elicit    Oral/Motor/Sensory Function Overall Oral Motor/Sensory Function: Within functional limits   Ice Chips Ice chips: Not tested   Thin Liquid Thin Liquid: Within functional limits Presentation: Straw;Cup    Nectar Thick Nectar Thick Liquid: Not tested   Honey Thick Honey Thick Liquid: Not tested   Puree Puree: Not tested   Solid     Solid: Within functional limits Presentation: Self Fed     Eino Farber, M.S., CCC-SLP Acute Rehabilitation Services Office: [phone]  Shanon Rosser Christinna Sprung 10/09/2022,12:30 PM

## 2022-10-09 NOTE — Consult Note (Signed)
Neurology Consultation  Reason for Consult: Strokes Referring Physician: Dr. Wilkie [name]  CC: Walking difficulty  History is obtained from: Patient  HPI: Kenneth [name] is a 72 y.o. male past medical history of congenital hearing loss, diabetes, hypertension, hyperlipidemia, sleep apnea, prostate cancer, presenting to the emergency room with complaints of difficulty walking upon waking up 6 AM on 10/08/2022.  Last known well was when he went to bed around 10 or 11 PM on 10/07/2022. He was evaluated with further imaging which revealed bilateral ACA infarctions and he was admitted for further stroke workup Does not report any headaches, visual symptoms.  No tingling or numbness.  Does not report any chest pain shortness of breath nausea vomiting. No prior history of strokes. No family history of strokes Reports that his wife has been hospitalized for about 7 weeks and is currently in LTAC awaiting transfer to rehab, which has kept him extremely busy.  LKW: 11 PM 10/07/2022 IV thrombolysis given?: no, outside the window EVT: No E LVO Premorbid modified Rankin scale (mRS):0   ROS: Full ROS was performed and is negative except as noted in the HPI.   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 His smoking use included cigarettes. He has a 0.50 pack-year smoking history. He has never used smokeless tobacco. He  reports that he does not drink alcohol and does not use drugs.  Medications  Current Facility-Administered Medications:    acetaminophen (TYLENOL) tablet 650 mg, 650 mg, Oral, Q6H PRN, Kenneth [name], Kenneth N, DO   aspirin EC tablet 81 mg, 81 mg, Oral, Daily, Kenneth Adolph N, DO, 81 mg at 10/08/22 2231   atorvastatin (LIPITOR) tablet 80 mg, 80 mg, Oral, Daily, Kenneth Adolph N, DO, 80 mg at 10/08/22 2230   clopidogrel (PLAVIX) tablet 75 mg, 75 mg, Oral, Daily, Kenneth Adolph N, DO, 75 mg at 10/08/22 2230   enoxaparin (LOVENOX) injection 40 mg, 40 mg, Subcutaneous, Q24H, Kenneth [name], Kenneth N, DO, 40 mg at 10/08/22 2231   ezetimibe (ZETIA) tablet 10 mg, 10 mg, Oral, Daily, Kenneth [name], Kenneth N, DO   labetalol (NORMODYNE) injection 5 mg, 5 mg, Intravenous, Q2H PRN, Kenneth [name], Kenneth N, DO   lactated ringers 1,000 mL with potassium chloride 20 mEq infusion, , Intravenous, Continuous, Kenneth Drop, DO, Last Rate: 100 mL/hr at 10/08/22 2312, New Bag at 10/08/22 2312   melatonin tablet 3 mg, 3 mg, Oral, QHS PRN, Kenneth [name], Kenneth N, DO   polyethylene glycol (MIRALAX / GLYCOLAX) packet 17 g, 17 g, Oral, Daily PRN, Kenneth [name], Kenneth N, DO   prochlorperazine (COMPAZINE) injection 5 mg, 5 mg, Intravenous, Q6H PRN, Kenneth Drop, DO   Exam:  Current vital signs: BP 131/61 (BP Location: Left Arm)   Pulse (!) 59   Temp 97.6 F (36.4 C) (Oral)   Resp 18   Ht 5\' 11"  (1.803 m)   Wt 101 kg   SpO2 96%   BMI 31.06 kg/m  Vital signs in last 24 hours: Temp:  [97.6 F (36.4 C)-98.2 F (36.8 C)] 97.6 F (36.4 C) (05/30 0006) Pulse Rate:  [57-80] 59 (05/30 0006) Resp:  [17-21] 18 (05/30 0006) BP: (106-149)/(52-82) 131/61 (05/30 0006) SpO2:  [90 %-97 %] 96 % (05/30 0006) Weight:  [101 kg] 101 kg (05/29 1535) General: Awake alert in no distress HEENT: Normocephalic, atraumatic Lungs: Clear Cardiovascular: Regular rhythm Neurological exam Awake alert oriented x 3 Speech is mildly dysarthric-has been because of his congenital hearing  loss. No aphasia Cranial nerves: Pupils equal round react light, extraocular movements intact, visual fields appear full, facial sensation intact, face symmetric, hearing significantly reduced bilaterally even with the hearing aids in place, he is able to communicate better with reading lips, tongue and palate midline. Motor examination with no drift in any of the 4 extremities.  Legs are 4+/5 in comparison to arms which are 5/5 in strength. Sensation intact Coordination with no dysmetria NIHSS-1 essentially for dysarthria  Labs I have reviewed labs in epic and the results pertinent to this consultation are:  CBC    Component Value Date/Time   WBC 9.3 10/08/2022 1642   RBC 4.19 (L) 10/08/2022 1642   HGB 12.4 (L) 10/08/2022 1642   HCT 37.9 (L) 10/08/2022 1642   PLT 180 10/08/2022 1642   MCV 90.5 10/08/2022 1642   MCH 29.6 10/08/2022 1642   MCHC 32.7 10/08/2022 1642   RDW 12.7 10/08/2022 1642   LYMPHSABS 1.7 10/08/2022 1642   MONOABS 1.3 (H) 10/08/2022 1642   EOSABS 0.2 10/08/2022 1642   BASOSABS 0.1 10/08/2022 1642    CMP     Component Value Date/Time   NA 137 10/08/2022 1642   NA 142 04/06/2017 1057   K 3.4 (L) 10/08/2022 1642   CL 100 10/08/2022 1642   CO2 28 10/08/2022 1642   GLUCOSE 96 10/08/2022 1642   BUN 22 10/08/2022 1642   BUN 13 04/06/2017 1057   CREATININE 0.90 10/08/2022 1642   CALCIUM 9.7 10/08/2022 1642   PROT 7.1 10/08/2022 1642   PROT 6.8 02/10/2017 1306   ALBUMIN 3.8 10/08/2022 1642   ALBUMIN 4.4 02/10/2017 1306   AST 17 10/08/2022 1642   ALT 16 10/08/2022 1642   ALKPHOS 51 10/08/2022 1642   BILITOT 1.5 (H) 10/08/2022 1642   BILITOT 0.3 02/10/2017 1306   GFRNONAA >60 10/08/2022 1642   GFRAA >60 07/25/2017 0442    Lipid Panel     Component Value Date/Time   CHOL 132 07/25/2017 0442   CHOL 159 02/10/2017 1307   TRIG 102 07/25/2017 0442   HDL 37 (L) 07/25/2017 0442   HDL 67 02/10/2017 1307   CHOLHDL 3.6 07/25/2017 0442   VLDL 20 07/25/2017  0442   LDLCALC 75 07/25/2017 0442   LDLCALC 58 02/10/2017 1307    Imaging I have reviewed the images obtained:  CT angio head and neck with no acute emergent large vessel occlusion but there is asymmetrical narrowing in the right A3 which could represent stenotic versus asymmetric branching.  MRI brain: Acute bilateral ACA territory infarcts left rater than right involving parasagittal aspects of bilateral frontoparietal lobes  Assessment:  72 year old with diabetes, hypertension hyperlipidemia presenting with sudden onset of  difficulty walking and bilateral leg weakness.  On examination NIH stroke scale is 1 essentially for dysarthria but has bilateral leg weakness which is fairly symmetric. MRI shows bilateral ACA territory infarctions. No emergent LVO Admitted for stroke workup Stroke etiology-cryptogenic for now-under investigation  Recommendations: Admit to hospitalist Frequent neurochecks Telemetry He is on aspirin at home I will add Plavix 75 mg for 3 months to his aspirin regimen followed by Plavix only. High intensity statin for LDL less than 70 2D echo A1c Lipid panel Permissive hypertension for another day or so before normalizing blood pressures PT OT Speech therapy Stroke team will follow with you Plan relayed to Dr. Margo [name]    -- Kenneth Dikes, MD Neurologist [facility] Pager: [phone]

## 2022-10-09 NOTE — Progress Notes (Signed)
Inpatient Rehab Admissions Coordinator Note:   Per therapy evaluations patient was screened for CIR candidacy by Stephania Fragmin, PT. At this time, pt appears to be a potential candidate for CIR. I will place an order for rehab consult for full assessment, per our protocol.  Please contact me any with questions.Estill Dooms, PT, DPT [phone] 10/09/22 10:13 AM

## 2022-10-09 NOTE — Assessment & Plan Note (Signed)
BMI 31 °

## 2022-10-09 NOTE — Assessment & Plan Note (Signed)
Appears euvolemic  

## 2022-10-09 NOTE — PMR Pre-admission (Signed)
PMR Admission Coordinator Pre-Admission Assessment  Patient: Kenneth [name] is an 72 y.o., male MRN: [id number] DOB: [birth year] Height: 5\' 11"  (180.3 cm) Weight: 94.7 kg  Insurance Information HMO:     PPO:      PCP:      IPA:      80/20:      OTHER:  PRIMARY: Medicare a and b      Policy#: [id number]      Subscriber: pt Benefits:  Phone #: passport one source online     Name: 5/30 Eff. Date: 02/09/2014     Deduct: $1632      Out of Pocket Max: none      Life Max: none CIR: 100%      SNF: 20 full days Outpatient: 80%     Co-Pay: 20% Home Health: 100%      Co-Pay: none DME: 80%     Co-Pay: 20% Providers: pt choice  SECONDARY: [employer] [place] supplement      Policy#: [id number]  Financial Counselor:       Phone#:   The "Data Collection Information Summary" for patients in Inpatient Rehabilitation Facilities with attached "Privacy Act Statement-Health Care Records" was provided and verbally reviewed with: Patient  Emergency Contact Information Contact Information     Name Relation Home Work Mobile   [place] Sister   [phone]   Hessie Dibble Other   [phone]   Ellis,[name] Spouse [phone]  [phone]      Current Medical History  Patient Admitting Diagnosis: CVA  History of Present Illness: 72 year old amle with history of obesity, type 2 DM, essential HTN, HLD, BPH, OSA not using CPAP, malignant neoplasm of prostate s/p radiation treatment, posterior cervical fusion/anterior cervical decompression in 2014, chronic anxiety/depression, Chronic HFpEF 60 to 65%. Presented to [facility] ED on 5/29 due to bilateral lowere extremity weakness left greater than right. Transferred to [facility] on 5/30.  Imaging revealed bilateral ACA infarctions . Neurology consulted. On ASA at home. Plavix recommended for 3 months with ASA and then followed by Plavix alone. LDL 45 to continue atorvastatin. To allow for permissive HTN. Hold ACEi, chlorthal.Continue home glargine for  type 2 DM. Hold mounjaro and metformin. SSI and CBG monitoring. LOOP to be placed prior to discharge.  Complete NIHSS TOTAL: 0  Patient's medical record [facility] and [facility] has been reviewed by the rehabilitation admission coordinator and physician.  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 No  Family History   family history includes Diabetes in his mother.  Current Medications  Current Facility-Administered Medications:    acetaminophen (TYLENOL) tablet 650 mg, 650 mg, Oral, Q6H PRN, Darlin Drop, DO   aspirin EC tablet 81 mg, 81 mg, Oral, Daily, Dow Adolph N, DO, 81 mg at 10/10/22 0910   atorvastatin (LIPITOR) tablet 80 mg, 80 mg, Oral, Daily, Dow Adolph N, DO, 80 mg at 10/10/22 0910   clopidogrel (PLAVIX) tablet 75 mg, 75 mg, Oral, Daily, Hall, Carole N, DO, 75 mg at 10/10/22 0910   enoxaparin (LOVENOX) injection 40 mg,  40 mg, Subcutaneous, Q24H, Dow Adolph N, DO, 40 mg at 10/09/22 2139   ezetimibe (ZETIA) tablet 10 mg, 10 mg, Oral, Daily, Hall, Carole N, DO, 10 mg at 10/10/22 0910   insulin aspart (novoLOG) injection 0-15 Units, 0-15 Units, Subcutaneous, TID WC, Danford, Christopher P, MD   insulin aspart (novoLOG) injection 0-5 Units, 0-5 Units, Subcutaneous, QHS, Danford, Christopher P, MD   insulin glargine-yfgn (SEMGLEE) injection 20 Units, 20 Units, Subcutaneous, Daily, Danford, Earl Lites, MD, 20 Units at 10/10/22 0910   labetalol (NORMODYNE) injection 5 mg, 5 mg, Intravenous, Q2H PRN, Hall, Carole N, DO   melatonin tablet 3 mg, 3 mg, Oral, QHS PRN, Hall, Carole N, DO   polyethylene glycol (MIRALAX / GLYCOLAX) packet  17 g, 17 g, Oral, Daily PRN, Hall, Carole N, DO   prochlorperazine (COMPAZINE) injection 5 mg, 5 mg, Intravenous, Q6H PRN, Dow Adolph N, DO  Patients Current Diet:  Diet Order             Diet heart healthy/carb modified Room service appropriate? Yes; Fluid consistency: Thin  Diet effective now                  Precautions / Restrictions Precautions Precautions: Fall Restrictions Weight Bearing Restrictions: No   Has the patient had 2 or more falls or a fall with injury in the past year? No  Prior Activity Level Community (5-7x/wk): Independent, works as IT trainer, drives  Prior Functional Level Self Care: Did the patient need help bathing, dressing, using the toilet or eating? Independent  Indoor Mobility: Did the patient need assistance with walking from room to room (with or without device)? Independent  Stairs: Did the patient need assistance with internal or external stairs (with or without device)? Independent  Functional Cognition: Did the patient need help planning regular tasks such as shopping or remembering to take medications? Independent  Patient Information Are you of Hispanic, Latino/a,or Spanish origin?: A. No, not of Hispanic, Latino/a, or Spanish origin What is your race?: A. White Do you need or want an interpreter to communicate with a doctor or health care staff?: 0. No  Patient's Response To:  Health Literacy and Transportation Is the patient able to respond to health literacy and transportation needs?: Yes Health Literacy - How often do you need to have someone help you when you read instructions, pamphlets, or other written material from your doctor or pharmacy?: Never In the past 12 months, has lack of transportation kept you from medical appointments or from getting medications?: No In the past 12 months, has lack of transportation kept you from meetings, work, or from getting things needed for daily living?: No  Home Assistive Devices /  Equipment Home Assistive Devices/Equipment: CBG Meter (cgm) Home Equipment: BSC/3in1, Shower seat - built in, [employer] (2 wheels), Transport chair, [employer] - single point  Prior Device Use: Indicate devices/aids used by the patient prior to current illness, exacerbation or injury? None of the above  Current Functional Level Cognition  Overall Cognitive Status: Within Functional Limits for tasks assessed Orientation Level: Oriented X4 General Comments: midly labile but overall pleasant, WFL. shows some insight into deficits but also reports desire to go back to work on Monday    Extremity Assessment (includes Sensation/Coordination)  Upper Extremity Assessment: Overall WFL for tasks assessed  Lower Extremity Assessment: Defer to PT evaluation RLE Deficits / Details: 4+/5 RLE Sensation: decreased proprioception LLE Deficits / Details: 4/5 LLE Sensation: decreased proprioception    ADLs  Overall  ADL's : Needs assistance/impaired Eating/Feeding: Independent Grooming: Min guard, Standing, Oral care, Wash/dry face, Brushing hair Grooming Details (indicate cue type and reason): able to stand without UE support during tasks. mild sway but able to correct Upper Body Bathing: Set up, Sitting Lower Body Bathing: Minimal assistance, Sit to/from stand Upper Body Dressing : Set up, Sitting Lower Body Dressing: Minimal assistance, Sit to/from stand Lower Body Dressing Details (indicate cue type and reason): able to cross LE to each socks, some assist for balance in standing/donning around waist likely Toilet Transfer: Min guard, Ambulation, Rolling walker (2 wheels) Toilet Transfer Details (indicate cue type and reason): use of RW needed for stability, would need Min A for mobility without AD Toileting- Clothing Manipulation and Hygiene: Minimal assistance, Sit to/from stand, Sitting/lateral lean Functional mobility during ADLs: Min guard, Rolling walker (2 wheels) General ADL Comments: pt w/  balance and LE strength deficits requiring new DME use for mobility and increased time/effort for ADLs. Pt likely needing increased assist for IADLs and would benefit from higher level cognitive assessments    Mobility  General bed mobility comments: Pt up in recliner.    Transfers  Overall transfer level: Needs assistance Equipment used: Rolling walker (2 wheels) Transfers: Sit to/from Stand Sit to Stand: Min guard General transfer comment: improvements to min guard when pushing from armrests    Ambulation / Gait / Stairs / Wheelchair Mobility  Ambulation/Gait Ambulation/Gait assistance: Editor, commissioning (Feet): 50 Feet Assistive device: Rolling walker (2 wheels) Gait Pattern/deviations: Step-through pattern, Narrow base of support, Decreased stride length, Drifts right/left General Gait Details: slow, guarded gait. Amb initially with RW min guard straght level surface and min assist turning, managing obstacles. Short gait trial in room without AD: pt reaching for UE support, shuffle steps, unsteady Gait velocity: decreased Gait velocity interpretation: 1.31 - 2.62 ft/sec, indicative of limited community ambulator    Posture / Balance Balance Overall balance assessment: Needs assistance Sitting-balance support: No upper extremity supported, Feet supported Sitting balance-Leahy Scale: Good Standing balance support: During functional activity, No upper extremity supported Standing balance-Leahy Scale: Fair Standing balance comment: reliant on external support. Improved stability with RW/UE support    Special needs/care consideration LOOP placement 5/31, Congenitally deaf - reads lips   Previous Home Environment  Living Arrangements: Spouse/significant other (wife currently hospitalized in LTAC since April)  Lives With: Spouse Available Help at Discharge: Available PRN/intermittently, Family Type of Home: House Home Layout: Two level, Able to live on main level with  bedroom/bathroom Home Access: Stairs to enter Entrance Stairs-Rails: Can reach both, Left, Right Entrance Stairs-Number of Steps: 2 Bathroom Shower/Tub: Health visitor: Standard Bathroom Accessibility: Yes How Accessible: Accessible via walker Home Care Services: No  Discharge Living Setting Plans for Discharge Living Setting: Patient's home, Lives with (comment) (wife) Type of Home at Discharge: House Discharge Home Layout: Two level, Able to live on main level with bedroom/bathroom Discharge Home Access: Stairs to enter Entrance Stairs-Rails: Left, Right, Can reach both Entrance Stairs-Number of Steps: 2 Discharge Bathroom Shower/Tub: Walk-in shower Discharge Bathroom Toilet: Standard Discharge Bathroom Accessibility: Yes How Accessible: Accessible via walker Does the patient have any problems obtaining your medications?: No  Social/Family/Support Systems 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 Hospitalized now for 7 weeks total Caregiver Availability: Other (Comment) Discharge Plan Discussed with Primary Caregiver: Yes Is Caregiver In Agreement with Plan?: Yes Does Caregiver/Family have Issues  with Lodging/Transportation while Pt is in Rehab?: Yes  Home address is [address] [place], [place] Office address is [address] [place], [place]  Goals Patient/Family Goal for Rehab: Mod I with PT and OT Expected length of stay: ELOS 7 to 10 days Pt/Family Agrees to Admission and willing to participate: Yes Program Orientation Provided & Reviewed with Pt/Caregiver Including Roles  & Responsibilities: Yes  Decrease burden of Care through IP rehab admission: n/a  Possible need for SNF placement upon discharge: not anticipated  Patient Condition: I have reviewed medical records from [facility] and [facility], spoken with patient. I met with  patient at the bedside for inpatient rehabilitation assessment.  Patient will benefit from ongoing PT and OT, can actively participate in 3 hours of therapy a day 5 days of the week, and can make measurable gains during the admission.  Patient will also benefit from the coordinated team approach during an Inpatient Acute Rehabilitation admission.  The patient will receive intensive therapy as well as Rehabilitation physician, nursing, social worker, and care management interventions.  Due to bladder management, bowel management, safety, skin/wound care, disease management, medication administration, pain management, and patient education the patient requires 24 hour a day rehabilitation nursing.  The patient is currently min assist overall with mobility and basic ADLs.  Discharge setting and therapy post discharge at home with outpatient is anticipated.  Patient has agreed to participate in the Acute Inpatient Rehabilitation Program and will admit today.  Preadmission Screen Completed By:  Clois Dupes, RN MSN 10/10/2022 11:22 AM ______________________________________________________________________   Discussed status with Dr. Shearon Stalls on 10/10/22 at 1122 and received approval for admission today.  Admission Coordinator:  Clois Dupes, RN MSN time 1122 Date 10/10/22   Assessment/Plan: Diagnosis: Does the need for close, 24 hr/day Medical supervision in concert with the patient's rehab needs make it unreasonable for this patient to be served in a less intensive setting? Yes Co-Morbidities requiring supervision/potential complications: Cardioembolic strokes w/ Hx prostate CA, hypertension, diastolic CHF, difficulty hearing, OSA, and type 2 diabetes Due to safety, skin/wound care, disease management, medication administration, and patient education, does the patient require 24 hr/day rehab nursing? Yes Does the patient require coordinated care of a physician, rehab nurse, PT, OT to address  physical and functional deficits in the context of the above medical diagnosis(es)? Yes Addressing deficits in the following areas: balance, endurance, locomotion, strength, transferring, bathing, dressing, feeding, grooming, and toileting Can the patient actively participate in an intensive therapy program of at least 3 hrs of therapy 5 days a week? Yes The potential for patient to make measurable gains while on inpatient rehab is excellent Anticipated functional outcomes upon discharge from inpatient rehab: modified independent PT, modified independent OT,  Estimated rehab length of stay to reach the above functional goals is: 7-10 days Anticipated discharge destination: Home 10. Overall Rehab/Functional Prognosis: excellent   MD Signature:  Angelina Sheriff, DO 10/10/2022

## 2022-10-09 NOTE — Progress Notes (Signed)
  Progress Note   Patient: Kenneth [name] ZOX:[id number] DOB: 08/03/1950 DOA: 10/08/2022     1 DOS: the patient was seen and examined on 10/09/2022 at 9:25AM      Brief hospital course: Kenneth [name] is a 72 y.o. M with DM, HTN, HLD, prosCA, dCHF, OSA, and obesity who presented with acute leg weakness.  Woke with bilateral paresthesias and weakness in legs, L>R, and fell.  In the ER, MRI showed bilateral ACA infarcts.     Assessment and Plan: * Acute CVA (cerebrovascular accident) (HCC) MRI brain showed acute infarcts in the bilateral ACA territory, left-greater-than-right involving the parasagittal bilateral parietal lobes - Non-invasive angiography showed narrowing of A3, no LVO, carotids unremarkable - Echocardiogram and dopplers pending - Lipids ordered: LDL 45, continue home atorvastatin - Aspirin and PLavix ordered at admission - Atrial fibrillation: not present on telemetry, no history - tPA not given because outside window - Dysphagia screen ordered in ER - PT eval ordered: CIR - Nonsmoker   Chronic diastolic CHF (congestive heart failure) (HCC) Appears euvolemic  Essential hypertension BP low normal - Allow permissive HTN - Hold ACEi, chlorthal  Obesity (BMI 30-39.9) BMI 31  OSA (obstructive sleep apnea) Not on CPAP  Mixed hyperlipidemia - Continue Lipitor  Controlled type 2 diabetes mellitus without complication, without long-term current use of insulin (HCC) Glucose controlled - Continue home glargine - Hold mounjaro, metformin - Start SS corrections          Subjective: Patient feeling well, no new complaints.  Still has weakness of the legs, but no speech changes, no loss of consciousness, no other new symptoms.  No nursing concerns.     Physical Exam: BP (!) 106/51 (BP Location: Right Arm)   Pulse 68   Temp 98.4 F (36.9 C) (Oral)   Resp 18   Ht 5\' 11"  (1.803 m)   Wt 101 kg   SpO2 93%   BMI 31.06 kg/m   Adult male, sitting up in  recliner, interactive and appropriate RRR, no murmurs, no peripheral edema Respiratory rate normal, lungs clear without rales or wheezes Abdomen soft without grimace to palpation Attention normal, affect appropriate, judgment insight appear normal, face symmetric, speech fluent, upper extremity strength seems 5/5 and symmetric, lower extremity strength seems 4/4 and symmetric    Data Reviewed: MRI shows bilateral ACA infarcts LDL 45 Urinalysis normal CT angiogram of the head and neck shows narrowing of A3, no LVO, no carotid disease BMP and CBC are normal    Family Communication: Family friend at the bedside    Disposition: Status is: Inpatient If Echo and LE doppler normal this morning, likely can clear medically for d/c today to CIR when bed available        Author: Alberteen Sam, MD 10/09/2022 10:54 AM  For on call review [email or web address].

## 2022-10-09 NOTE — Plan of Care (Signed)
  Problem: Education: Goal: Knowledge of General Education information will improve Description: Including pain rating scale, medication(s)/side effects and non-pharmacologic comfort measures Outcome: Progressing   Problem: Health Behavior/Discharge Planning: Goal: Ability to manage health-related needs will improve Outcome: Progressing   Problem: Clinical Measurements: Goal: Ability to maintain clinical measurements within normal limits will improve Outcome: Progressing Goal: Will remain free from infection Outcome: Progressing Goal: Diagnostic test results will improve Outcome: Progressing Goal: Respiratory complications will improve Outcome: Progressing Goal: Cardiovascular complication will be avoided Outcome: Progressing   Problem: Activity: Goal: Risk for activity intolerance will decrease Outcome: Not Progressing   Problem: Nutrition: Goal: Adequate nutrition will be maintained Outcome: Progressing   Problem: Coping: Goal: Level of anxiety will decrease Outcome: Progressing   Problem: Elimination: Goal: Will not experience complications related to bowel motility Outcome: Progressing Goal: Will not experience complications related to urinary retention Outcome: Progressing   Problem: Pain Managment: Goal: General experience of comfort will improve Outcome: Progressing   Problem: Safety: Goal: Ability to remain free from injury will improve Outcome: Progressing   Problem: Skin Integrity: Goal: Risk for impaired skin integrity will decrease Outcome: Progressing   Problem: Education: Goal: Knowledge of disease or condition will improve Outcome: Progressing Goal: Knowledge of secondary prevention will improve (MUST DOCUMENT ALL) Outcome: Progressing Goal: Knowledge of patient specific risk factors will improve Loraine Leriche N/A or DELETE if not current risk factor) Outcome: Progressing   Problem: Ischemic Stroke/TIA Tissue Perfusion: Goal: Complications of  ischemic stroke/TIA will be minimized Outcome: Progressing   Problem: Coping: Goal: Will verbalize positive feelings about self Outcome: Progressing Goal: Will identify appropriate support needs Outcome: Progressing   Problem: Self-Care: Goal: Ability to participate in self-care as condition permits will improve Outcome: Progressing Goal: Verbalization of feelings and concerns over difficulty with self-care will improve Outcome: Progressing Goal: Ability to communicate needs accurately will improve Outcome: Progressing   Problem: Nutrition: Goal: Risk of aspiration will decrease Outcome: Progressing Goal: Dietary intake will improve Outcome: Progressing

## 2022-10-09 NOTE — Progress Notes (Signed)
Lower extremity venous bilateral study completed.   Please see CV Proc for preliminary results.   Yalissa Fink, RDMS, RVT  

## 2022-10-09 NOTE — Assessment & Plan Note (Signed)
Glucose controlled - Continue home glargine - Hold mounjaro, metformin - Start SS corrections

## 2022-10-09 NOTE — Evaluation (Signed)
Occupational Therapy Evaluation Patient Details Name: Kenneth [name] MRN: [id number] DOB: 17-Jun-1950 Today's Date: 10/09/2022   History of Present Illness Pt is a 72 y.o. 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 He presented to the ED from home after a fall with c/o BLE weakness. PMH: congenital hearing loss, DM, HTN, hyperlipidemia, sleep apnea, prostate cancer   Clinical Impression   PTA, pt lives with wife (who is currently hospitalized at [facility]). Pt reports complete independence in all daily tasks, currently working as a IT trainer and active at baseline (walks at gym daily). Pt presents with new deficits in BLE strength, endurance and dynamic standing balance. Pt reliant on RW for mobility at this time, Setup for UB ADL and Min A for LB ADLs. Feel pt would progress to a Modified Independent level very quickly with intensive rehab services based on high PLOF and motivation. Will continue to follow acutely.       Recommendations for follow up therapy are one component of a multi-disciplinary discharge planning process, led by the attending physician.  Recommendations may be updated based on patient status, additional functional criteria and insurance authorization.   Assistance Recommended at Discharge Set up Supervision/Assistance  Patient can return home with the following A little help with bathing/dressing/bathroom;Assistance with cooking/housework;Direct supervision/assist for medications management;Direct supervision/assist for financial management;Assist for transportation;Help with stairs or ramp for entrance    Functional Status Assessment  Patient has had a recent decline in their functional status and demonstrates the ability to make significant improvements in function in a reasonable and predictable amount of time.  Equipment Recommendations  None recommended by OT    Recommendations for Other Services Rehab consult     Precautions / Restrictions Precautions Precautions:  Fall Restrictions Weight Bearing Restrictions: No      Mobility Bed Mobility               General bed mobility comments: Pt up in recliner.    Transfers Overall transfer level: Needs assistance Equipment used: Rolling walker (2 wheels) Transfers: Sit to/from Stand Sit to Stand: Min guard           General transfer comment: improvements to min guard when pushing from armrests      Balance Overall balance assessment: Needs assistance Sitting-balance support: No upper extremity supported, Feet supported Sitting balance-Leahy Scale: Good     Standing balance support: During functional activity, No upper extremity supported Standing balance-Leahy Scale: Fair                             ADL either performed or assessed with clinical judgement   ADL Overall ADL's : Needs assistance/impaired Eating/Feeding: Independent   Grooming: Min guard;Standing;Oral care;Wash/dry face;Brushing hair Grooming Details (indicate cue type and reason): able to stand without UE support during tasks. mild sway but able to correct Upper Body Bathing: Set up;Sitting   Lower Body Bathing: Minimal assistance;Sit to/from stand   Upper Body Dressing : Set up;Sitting   Lower Body Dressing: Minimal assistance;Sit to/from stand Lower Body Dressing Details (indicate cue type and reason): able to cross LE to each socks, some assist for balance in standing/donning around waist likely Toilet Transfer: Min guard;Ambulation;Rolling walker (2 wheels) Toilet Transfer Details (indicate cue type and reason): use of RW needed for stability, would need Min A for mobility without AD Toileting- Clothing Manipulation and Hygiene: Minimal assistance;Sit to/from stand;Sitting/lateral lean       Functional mobility during ADLs: Min  guard;Rolling walker (2 wheels) General ADL Comments: pt w/ balance and LE strength deficits requiring new DME use for mobility and increased time/effort for ADLs. Pt  likely needing increased assist for IADLs and would benefit from higher level cognitive assessments     Vision Baseline Vision/History: 1 Wears glasses Ability to See in Adequate Light: 0 Adequate Patient Visual Report: No change from baseline Vision Assessment?: No apparent visual deficits     Perception     Praxis      Pertinent Vitals/Pain Pain Assessment Pain Assessment: No/denies pain     Hand Dominance Right   Extremity/Trunk Assessment Upper Extremity Assessment Upper Extremity Assessment: Overall WFL for tasks assessed   Lower Extremity Assessment Lower Extremity Assessment: Defer to PT evaluation RLE Deficits / Details: 4+/5 RLE Sensation: decreased proprioception LLE Deficits / Details: 4/5 LLE Sensation: decreased proprioception   Cervical / Trunk Assessment Cervical / Trunk Assessment: Normal   Communication Communication Communication: HOH (B hearing aides)   Cognition Arousal/Alertness: Awake/alert Behavior During Therapy: WFL for tasks assessed/performed Overall Cognitive Status: Within Functional Limits for tasks assessed                                 General Comments: midly labile but overall pleasant, WFL. shows some insight into deficits but also reports desire to go back to work on Monday     General Comments  sister in law, [place] entering during session asking about DC timing, what he should do with therapy, etc    Exercises     Shoulder Instructions      Home Living Family/patient expects to be discharged to:: Private residence Living Arrangements: Spouse/significant other (wife currently hospitalized in [facility] since April) Available Help at Discharge: Available PRN/intermittently;Family Type of Home: House Home Access: Stairs to enter [employer] of Steps: 2 Entrance Stairs-Rails: Can reach both;Left;Right Home Layout: Able to live on main level with bedroom/bathroom     Bathroom Shower/Tub: Retail banker: Standard     Home Equipment: BSC/3in1;Shower seat - built Charity fundraiser (2 wheels);Transport chair;Cane - single point          Prior Functioning/Environment Prior Level of Function : Independent/Modified Independent;Driving;Working/employed             Mobility Comments: no AD, typically walks every day at gym ADLs Comments: 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 walks his small dog, Bella        OT Problem List: Decreased strength;Impaired balance (sitting and/or standing);Decreased activity tolerance;Decreased knowledge of use of DME or AE      OT Treatment/Interventions: Self-care/ADL training;Therapeutic exercise;Energy conservation;DME and/or AE instruction;Therapeutic activities;Patient/family education    OT Goals(Current goals can be found in the care plan section) Acute Rehab OT Goals Patient Stated Goal: get back to work OT Goal Formulation: With patient Time For Goal Achievement: 10/23/22 Potential to Achieve Goals: Good  OT Frequency: Min 2X/week    Co-evaluation              AM-PAC OT "6 Clicks" Daily Activity     Outcome Measure Help from another person eating meals?: None Help from another person taking care of personal grooming?: A Little Help from another person toileting, which includes using toliet, bedpan, or urinal?: A Little Help from another person bathing (including washing, rinsing, drying)?: A Little Help from another person to put on and taking  off regular upper body clothing?: A Little Help from another person to put on and taking off regular lower body clothing?: A Little 6 Click Score: 19   End of Session Equipment Utilized During Treatment: Rolling walker (2 wheels) Nurse Communication: Mobility status  Activity Tolerance: Patient tolerated treatment well Patient left: in chair;with call bell/phone within reach;with family/visitor present;Other (comment) (MD at  bedside)  OT Visit Diagnosis: Unsteadiness on feet (R26.81);Other abnormalities of gait and mobility (R26.89);Muscle weakness (generalized) (M62.81)                Time: 1610-9604 OT Time Calculation (min): 33 min Charges:  OT General Charges $OT Visit: 1 Visit OT Evaluation $OT Eval Moderate Complexity: 1 Mod OT Treatments $Self Care/Home Management : 8-22 mins  Bradd Canary, OTR/L Acute Rehab Services Office: [phone]   Lorre Munroe 10/09/2022, 9:39 AM

## 2022-10-09 NOTE — Progress Notes (Signed)
  Echocardiogram 2D Echocardiogram has been performed.  Kenneth [name] 10/09/2022, 2:46 PM

## 2022-10-09 NOTE — Hospital Course (Signed)
Kenneth [name] is a 72 y.o. M with DM, HTN, HLD, prosCA, dCHF, OSA, and obesity who presented with acute leg weakness.  Woke with bilateral paresthesias and weakness in legs, L>R, and fell.  In the ER, MRI showed bilateral ACA infarcts.

## 2022-10-10 ENCOUNTER — Encounter (HOSPITAL_COMMUNITY)
Admission: EM | Disposition: A | Discharge status: Rehabilitation Facility | Payer: Self-pay | Source: Home / Self Care | Attending: Family Medicine

## 2022-10-10 ENCOUNTER — Encounter (HOSPITAL_COMMUNITY): Payer: Self-pay | Admitting: Physical Medicine and Rehabilitation

## 2022-10-10 ENCOUNTER — Other Ambulatory Visit: Payer: Self-pay

## 2022-10-10 ENCOUNTER — Inpatient Hospital Stay (HOSPITAL_COMMUNITY)
Admission: RE | Admit: 2022-10-10 | Discharge: 2022-10-17 | DRG: 057 | Disposition: A | Discharge status: Home / Self Care | Payer: Medicare Other | Source: Intra-hospital | Attending: Physical Medicine and Rehabilitation | Admitting: Physical Medicine and Rehabilitation

## 2022-10-10 ENCOUNTER — Encounter (HOSPITAL_COMMUNITY): Payer: Self-pay | Admitting: Internal Medicine

## 2022-10-10 DIAGNOSIS — H903 Sensorineural hearing loss, bilateral: Secondary | ICD-10-CM | POA: Diagnosis present

## 2022-10-10 DIAGNOSIS — R001 Bradycardia, unspecified: Secondary | ICD-10-CM | POA: Diagnosis present

## 2022-10-10 DIAGNOSIS — Z833 Family history of diabetes mellitus: Secondary | ICD-10-CM

## 2022-10-10 DIAGNOSIS — I10 Essential (primary) hypertension: Secondary | ICD-10-CM | POA: Diagnosis present

## 2022-10-10 DIAGNOSIS — E119 Type 2 diabetes mellitus without complications: Secondary | ICD-10-CM

## 2022-10-10 DIAGNOSIS — E785 Hyperlipidemia, unspecified: Secondary | ICD-10-CM | POA: Diagnosis present

## 2022-10-10 DIAGNOSIS — Z981 Arthrodesis status: Secondary | ICD-10-CM | POA: Diagnosis not present

## 2022-10-10 DIAGNOSIS — G4733 Obstructive sleep apnea (adult) (pediatric): Secondary | ICD-10-CM | POA: Diagnosis present

## 2022-10-10 DIAGNOSIS — Z880 Allergy status to penicillin: Secondary | ICD-10-CM | POA: Diagnosis not present

## 2022-10-10 DIAGNOSIS — F439 Reaction to severe stress, unspecified: Secondary | ICD-10-CM | POA: Diagnosis not present

## 2022-10-10 DIAGNOSIS — Z7985 Long-term (current) use of injectable non-insulin antidiabetic drugs: Secondary | ICD-10-CM | POA: Diagnosis not present

## 2022-10-10 DIAGNOSIS — E1149 Type 2 diabetes mellitus with other diabetic neurological complication: Secondary | ICD-10-CM | POA: Diagnosis not present

## 2022-10-10 DIAGNOSIS — I639 Cerebral infarction, unspecified: Secondary | ICD-10-CM | POA: Diagnosis present

## 2022-10-10 DIAGNOSIS — E1169 Type 2 diabetes mellitus with other specified complication: Secondary | ICD-10-CM | POA: Diagnosis not present

## 2022-10-10 DIAGNOSIS — I69354 Hemiplegia and hemiparesis following cerebral infarction affecting left non-dominant side: Principal | ICD-10-CM

## 2022-10-10 DIAGNOSIS — Z888 Allergy status to other drugs, medicaments and biological substances status: Secondary | ICD-10-CM | POA: Diagnosis not present

## 2022-10-10 DIAGNOSIS — Z7982 Long term (current) use of aspirin: Secondary | ICD-10-CM | POA: Diagnosis not present

## 2022-10-10 DIAGNOSIS — D509 Iron deficiency anemia, unspecified: Secondary | ICD-10-CM | POA: Diagnosis present

## 2022-10-10 DIAGNOSIS — Z794 Long term (current) use of insulin: Secondary | ICD-10-CM | POA: Diagnosis not present

## 2022-10-10 DIAGNOSIS — I69351 Hemiplegia and hemiparesis following cerebral infarction affecting right dominant side: Secondary | ICD-10-CM | POA: Diagnosis not present

## 2022-10-10 DIAGNOSIS — E782 Mixed hyperlipidemia: Secondary | ICD-10-CM | POA: Diagnosis present

## 2022-10-10 DIAGNOSIS — I69393 Ataxia following cerebral infarction: Secondary | ICD-10-CM

## 2022-10-10 DIAGNOSIS — Z8546 Personal history of malignant neoplasm of prostate: Secondary | ICD-10-CM

## 2022-10-10 DIAGNOSIS — Z87891 Personal history of nicotine dependence: Secondary | ICD-10-CM

## 2022-10-10 DIAGNOSIS — Z79899 Other long term (current) drug therapy: Secondary | ICD-10-CM

## 2022-10-10 DIAGNOSIS — G4700 Insomnia, unspecified: Secondary | ICD-10-CM | POA: Diagnosis present

## 2022-10-10 DIAGNOSIS — Z7984 Long term (current) use of oral hypoglycemic drugs: Secondary | ICD-10-CM | POA: Diagnosis not present

## 2022-10-10 DIAGNOSIS — D649 Anemia, unspecified: Secondary | ICD-10-CM | POA: Diagnosis present

## 2022-10-10 HISTORY — PX: LOOP RECORDER INSERTION: EP1214

## 2022-10-10 LAB — GLUCOSE, CAPILLARY
Glucose-Capillary: 96 mg/dL (ref 70–99)
Glucose-Capillary: 101 mg/dL — ABNORMAL HIGH (ref 70–99)
Glucose-Capillary: 129 mg/dL — ABNORMAL HIGH (ref 70–99)

## 2022-10-10 SURGERY — LOOP RECORDER INSERTION

## 2022-10-10 MED ORDER — BISACODYL 10 MG RE SUPP: 10.0000 mg | Freq: Every day | RECTAL | Status: DC | PRN

## 2022-10-10 MED ORDER — ALUM & MAG HYDROXIDE-SIMETH 200-200-20 MG/5ML PO SUSP: 30.0000 mL | ORAL | Status: DC | PRN

## 2022-10-10 MED ORDER — FLEET ENEMA 7-19 GM/118ML RE ENEM: 1.0000 | ENEMA | Freq: Once | RECTAL | Status: DC | PRN

## 2022-10-10 MED ORDER — ENOXAPARIN SODIUM 40 MG/0.4ML IJ SOSY
40.0000 mg | PREFILLED_SYRINGE | INTRAMUSCULAR | Status: DC
  Administered 2022-10-10 – 2022-10-16 (×7): 40 mg via SUBCUTANEOUS
  Filled 2022-10-10 (×7): qty 0.4

## 2022-10-10 MED ORDER — ATORVASTATIN CALCIUM 80 MG PO TABS: 80.0000 mg | ORAL_TABLET | Freq: Every day | ORAL | Status: DC

## 2022-10-10 MED ORDER — CLOPIDOGREL BISULFATE 75 MG PO TABS: 75.0000 mg | ORAL_TABLET | Freq: Every day | ORAL | Status: DC

## 2022-10-10 MED ORDER — PROCHLORPERAZINE EDISYLATE 10 MG/2ML IJ SOLN: 5.0000 mg | Freq: Four times a day (QID) | INTRAMUSCULAR | Status: DC | PRN

## 2022-10-10 MED ORDER — GUAIFENESIN-DM 100-10 MG/5ML PO SYRP: 5.0000 mL | ORAL_SOLUTION | Freq: Four times a day (QID) | ORAL | Status: DC | PRN

## 2022-10-10 MED ORDER — ACETAMINOPHEN 325 MG PO TABS: 325.0000 mg | ORAL_TABLET | ORAL | Status: DC | PRN

## 2022-10-10 MED ORDER — POLYETHYLENE GLYCOL 3350 17 G PO PACK: 17.0000 g | PACK | Freq: Every day | ORAL | Status: DC | PRN

## 2022-10-10 MED ORDER — MELATONIN 3 MG PO TABS: 3.0000 mg | ORAL_TABLET | Freq: Every evening | ORAL | Status: DC | PRN

## 2022-10-10 MED ORDER — ATORVASTATIN CALCIUM 40 MG PO TABS
40.0000 mg | ORAL_TABLET | Freq: Every day | ORAL | Status: DC
  Administered 2022-10-11 – 2022-10-16 (×6): 40 mg via ORAL
  Filled 2022-10-10 (×6): qty 1

## 2022-10-10 MED ORDER — EZETIMIBE 10 MG PO TABS
10.0000 mg | ORAL_TABLET | Freq: Every day | ORAL | Status: DC
  Administered 2022-10-11 – 2022-10-17 (×7): 10 mg via ORAL
  Filled 2022-10-10 (×7): qty 1

## 2022-10-10 MED ORDER — PROCHLORPERAZINE MALEATE 5 MG PO TABS: 5.0000 mg | ORAL_TABLET | Freq: Four times a day (QID) | ORAL | Status: DC | PRN

## 2022-10-10 MED ORDER — INSULIN ASPART 100 UNIT/ML IJ SOLN
0.0000 [IU] | Freq: Three times a day (TID) | INTRAMUSCULAR | Status: DC
  Administered 2022-10-11: 1 [IU] via SUBCUTANEOUS

## 2022-10-10 MED ORDER — CLOPIDOGREL BISULFATE 75 MG PO TABS
75.0000 mg | ORAL_TABLET | Freq: Every day | ORAL | Status: DC
  Administered 2022-10-11 – 2022-10-17 (×7): 75 mg via ORAL
  Filled 2022-10-10 (×6): qty 1

## 2022-10-10 MED ORDER — ASPIRIN 81 MG PO TBEC
81.0000 mg | DELAYED_RELEASE_TABLET | Freq: Every day | ORAL | Status: DC
  Administered 2022-10-11 – 2022-10-17 (×7): 81 mg via ORAL
  Filled 2022-10-10 (×7): qty 1

## 2022-10-10 MED ORDER — PROCHLORPERAZINE 25 MG RE SUPP: 12.5000 mg | Freq: Four times a day (QID) | RECTAL | Status: DC | PRN

## 2022-10-10 MED ORDER — TAMSULOSIN HCL 0.4 MG PO CAPS
0.4000 mg | ORAL_CAPSULE | Freq: Every day | ORAL | Status: DC
  Administered 2022-10-10 – 2022-10-16 (×7): 0.4 mg via ORAL
  Filled 2022-10-10 (×7): qty 1

## 2022-10-10 MED ORDER — DIPHENHYDRAMINE HCL 25 MG PO CAPS: 25.0000 mg | ORAL_CAPSULE | Freq: Four times a day (QID) | ORAL | Status: DC | PRN

## 2022-10-10 MED ORDER — ATORVASTATIN CALCIUM 40 MG PO TABS: 40.0000 mg | ORAL_TABLET | Freq: Every day | ORAL | Status: DC

## 2022-10-10 MED ORDER — INSULIN GLARGINE-YFGN 100 UNIT/ML ~~LOC~~ SOLN
20.0000 [IU] | Freq: Every day | SUBCUTANEOUS | Status: DC
  Administered 2022-10-11 – 2022-10-13 (×3): 20 [IU] via SUBCUTANEOUS
  Filled 2022-10-10 (×4): qty 0.2

## 2022-10-10 MED ORDER — LIDOCAINE-EPINEPHRINE 1 %-1:100000 IJ SOLN
INTRAMUSCULAR | Status: DC | PRN
  Administered 2022-10-10: 10 mL via INTRADERMAL

## 2022-10-10 MED ORDER — LIDOCAINE-EPINEPHRINE 1 %-1:100000 IJ SOLN
INTRAMUSCULAR | Status: AC
  Filled 2022-10-10: qty 1

## 2022-10-10 MED ORDER — TRAZODONE HCL 50 MG PO TABS: 25.0000 mg | ORAL_TABLET | Freq: Every evening | ORAL | Status: DC | PRN

## 2022-10-10 MED ORDER — INSULIN ASPART 100 UNIT/ML IJ SOLN: 0.0000 [IU] | Freq: Every day | INTRAMUSCULAR | Status: DC

## 2022-10-10 SURGICAL SUPPLY — 2 items
PACK LOOP INSERTION (CUSTOM PROCEDURE TRAY) ×1 IMPLANT
SYSTEM MONITOR REVEAL LINQ II (Prosthesis & Implant Heart) IMPLANT

## 2022-10-10 NOTE — Discharge Instructions (Signed)
Post procedure wound care instructions Keep incision clean and dry for 3 days. You can remove outer dressing tomorrow. Leave steri-strips (little pieces of tape) on until they fall off or 7 days. Call the office ([phone]) for redness, drainage, swelling, or fever.  

## 2022-10-10 NOTE — Progress Notes (Signed)
Inpatient Rehabilitation Admission Medication Review by a Pharmacist  A complete drug regimen review was completed for this patient to identify any potential clinically significant medication issues.  High Risk Drug Classes Is patient taking? Indication by Medication  Antipsychotic Yes Compazine: nausea/vomiting  Anticoagulant Yes Lovenox: VTE prophylaxis  Antibiotic No   Opioid No   Antiplatelet Yes Asa/Plavix x 3 wks (5/29>>6/18), then 6/19 Plavix alone: stroke  Hypoglycemics/insulin Yes SSI, Semglee: Type 2 DM  Vasoactive Medication No   Chemotherapy No   Other Yes Lipitor, Zetia: stroke, hyperlipidemia Flomax: urinary retention   PTA: Benazepril40, apap, asa81, Lipitor40, WellbutrinSR100 daily, chlorthalidone, zetia, metformin500BID, Mounjaro, Flomax, Toujeo36u, vitD, Prolia60mg Q70months -last dose 5/22  Inpt: asa, Lipitor, Plavix, zetia, Lovenox, SSI, Semglee. PRN: apap, labetolol, melatonin, Miralax, compazine  Type of Medication Issue Identified Description of Issue Recommendation(s)  Drug Interaction(s) (clinically significant)     Duplicate Therapy     Allergy     No Medication Administration End Date     Incorrect Dose     Additional Drug Therapy Needed     Significant med changes from prior encounter (inform family/care partners about these prior to discharge). New Plavix Increased Lipitor dose Communicate medication changes with patient/family at discharge  Other  Holding Mounjaro, metformin, Toujeo, Benazepril, Chlorthalidone     Clinically significant medication issues were identified that warrant physician communication and completion of prescribed/recommended actions by midnight of the next day:  No  Name of provider notified for urgent issues identified:    Provider Method of Notification:     Pharmacist comments:    Time spent performing this drug regimen review (minutes): 30  Loralee Pacas, PharmD, BCPS  Please check AMION for all [facility]  phone numbers After 10:00 PM, call Main Pharmacy [phone]

## 2022-10-10 NOTE — Consult Note (Addendum)
ELECTROPHYSIOLOGY CONSULT NOTE  Patient ID: Kenneth [name] MRN: [id number], DOB/AGE: [birth year]   Admit date: 10/08/2022 Date of Consult: 10/10/2022  Primary Physician: [name], MD Primary Cardiologist: Dr. Anne Fu Reason for Consultation: Cryptogenic stroke; recommendations regarding Implantable Loop Recorder, requested by Dr. Anne Fu  History of Present Illness Kenneth [name] was admitted on 10/08/2022 with difficulty walking, found with stroke.    PMHx includes: HTN, HLD, DM, OSA, prostate CA  Neurology notes: bilateral ACA infarct, left more than right, infarct embolic concerning for cardioembolic source .  he has undergone workup for stroke including echocardiogram and carotid angio.  The patient has been monitored on telemetry which has demonstrated sinus rhythm with no arrhythmias.   Neurology has deferred TEE  Echocardiogram this admission demonstrated.   1. Left ventricular ejection fraction, by estimation, is 55 to 60%. The  left ventricle has normal function. The left ventricle has no regional  wall motion abnormalities. Left ventricular diastolic parameters are  consistent with Grade I diastolic  dysfunction (impaired relaxation).   2. Right ventricular systolic function is normal. The right ventricular  size is normal. There is normal pulmonary artery systolic pressure. The  estimated right ventricular systolic pressure is 29.4 mmHg.   3. The mitral valve is normal in structure. No evidence of mitral valve  regurgitation. No evidence of mitral stenosis.   4. The aortic valve is tricuspid. There is moderate calcification of the  aortic valve. There is moderate thickening of the aortic valve. Aortic  valve regurgitation is not visualized. Aortic valve  sclerosis/calcification is present, without any evidence  of aortic stenosis.   5. The inferior vena cava is normal in size with greater than 50%  respiratory variability, suggesting right atrial pressure of 3  mmHg.   6. Agitated saline contrast bubble study was negative, with no evidence  of any interatrial shunt.   Comparison(s): No significant change from prior study.   Conclusion(s)/Recommendation(s): No intracardiac source of embolism  detected on this transthoracic study. Consider a transesophageal  echocardiogram to exclude cardiac source of embolism if clinically  indicated.   Lab work is reviewed.   Prior to admission, the patient denies chest pain, shortness of breath, dizziness, palpitations, or syncope.  They are recovering from their stroke with plans to CIR at discharge.   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   History of colon polyps - adenomas 04/03/2010   Hyperlipidemia    Hypertension    OSA (obstructive sleep apnea)    refusing CPAP   Prostate cancer [facility])      Surgical History:  Past Surgical History:  Procedure Laterality Date   ANTERIOR CERVICAL DECOMP/DISCECTOMY FUSION N/A 10/01/2012   Procedure: ANTERIOR CERVICAL DECOMPRESSION/DISCECTOMY FUSION 2 LEVELS;  Surgeon: Tia Alert, MD;  Location: [facility];  Service: Neurosurgery;  Laterality: N/A;  Cervical five-six,Cervical six-seven   COLONOSCOPY  09/30/2010, 03/01/2014   diverticulosis, small internal hemorrhoids   COLONOSCOPY W/ POLYPECTOMY  04/03/2010   4 polyps 8-29mm, worst TV adenoma with high-grade dysplasia   NO PAST SURGERIES     POLYPECTOMY     POSTERIOR CERVICAL FUSION/FORAMINOTOMY Left 10/20/2012   Procedure: C/5-6,C/6-7 [place]/Multi level,POSTERIOR CERVICAL FUSION/FORAMINOTOMY C/4-7;  Surgeon: Tia Alert, MD;  Location: [facility];  Service: Neurosurgery;  Laterality: Left;  Cervical Five-Six, Cervical Six-Seven Laminectomies, Posterior Cervical Fusion/Foraminotomies Cervical Four  through Seven.    TONSILLECTOMY       Medications Prior to Admission  Medication Sig Dispense Refill Last Dose    acetaminophen (TYLENOL) 500 MG tablet Take 500 mg by mouth daily as needed for headache.   Past Week   aspirin 81 MG tablet Take 81 mg by mouth every evening.   10/07/2022   atorvastatin (LIPITOR) 40 MG tablet Take 40 mg by mouth daily.   10/07/2022   benazepril (LOTENSIN) 40 MG tablet Take 1 tablet (40 mg total) by mouth daily. Please schedule appt for future refills. 1st attempt 30 tablet 0 10/08/2022   buPROPion ER (WELLBUTRIN SR) 100 MG 12 hr tablet Take 100 mg by mouth daily.   10/08/2022   chlorthalidone (HYGROTON) 25 MG tablet Take 25 mg by mouth daily.   10/08/2022   denosumab (PROLIA) 60 MG/ML SOSY injection Inject 60 mg into the skin every 6 (six) months.   10/01/2022   ezetimibe (ZETIA) 10 MG tablet Take 10 mg by mouth daily.   10/08/2022   metFORMIN (GLUCOPHAGE) 500 MG tablet Take 500 mg by mouth 2 (two) times daily with a meal.   10/08/2022   MOUNJARO 5 MG/0.5ML Pen Inject 5 mg into the skin once a week.   10/05/2022   tamsulosin (FLOMAX) 0.4 MG CAPS capsule Take 0.4 mg by mouth daily. Patient states he is taking it twice daily   10/08/2022   TOUJEO SOLOSTAR 300 UNIT/ML Solostar Pen Inject 36 Units into the skin daily.   10/08/2022   VITAMIN D PO Take 1 tablet by mouth 2 (two) times daily. Twice a day pr patient   10/08/2022   B-D UF III MINI PEN NEEDLES 31G X 5 MM MISC Inject 1 Stick as directed as directed.       Inpatient Medications:   aspirin EC  81 mg Oral Daily   atorvastatin  80 mg Oral Daily   clopidogrel  75 mg Oral Daily   enoxaparin (LOVENOX) injection  40 mg Subcutaneous Q24H   ezetimibe  10 mg Oral Daily   insulin aspart  0-15 Units Subcutaneous TID WC   insulin aspart  0-5 Units Subcutaneous QHS   insulin glargine-yfgn  20 Units Subcutaneous Daily    Allergies:  Allergies  Allergen Reactions   Jardiance [Empagliflozin]     unknown   Penicillins     CHILDHOOD ALLERGY Has patient had a PCN reaction causing immediate rash, facial/tongue/throat swelling, SOB or  lightheadedness with hypotension: Unknown Has patient had a PCN reaction causing severe rash involving mucus membranes or skin necrosis: Unknown Has patient had a PCN reaction that required hospitalization: Unknown Has patient had a PCN reaction occurring within the last 10 years: Unknown If all of the above answers are "NO", then may proceed with Cephalosporin use.      Social History   Socioeconomic History   Marital status: Married    Spouse name: Not on file   Number of children: Not on file   Years of education: Not on file   Highest education level: Not on file  Occupational History   Not on file  Tobacco Use   Smoking status: Former    Packs/day: 0.25    Years: 2.00    Additional pack years: 0.00    Total pack years: 0.50    Types: Cigarettes    Quit date: 09/22/1969    Years since quitting: 53.0   Smokeless tobacco: Never  Vaping Use   Vaping Use: Never used  Substance and Sexual Activity   Alcohol use: No   Drug use: No   Sexual activity: Yes  Other Topics Concern   Not on file  Social History Narrative   Not on file   Social Determinants of Health   Financial Resource Strain: Not on file  Food Insecurity: No Food Insecurity (10/08/2022)   Hunger Vital Sign    Worried About Running Out of Food in the Last Year: Never true    Ran Out of Food in the Last Year: Never true  Transportation Needs: No Transportation Needs (10/08/2022)   PRAPARE - Administrator, Civil Service (Medical): No    Lack of Transportation (Non-Medical): No  Physical Activity: Not on file  Stress: Not on file  Social Connections: Not on file  Intimate Partner Violence: Not At Risk (10/08/2022)   Humiliation, Afraid, Rape, and Kick questionnaire    Fear of Current or Ex-Partner: No    Emotionally Abused: No    Physically Abused: No    Sexually Abused: No     Family History  Problem Relation Age of Onset   Diabetes Mother    Stomach cancer Neg Hx    Rectal cancer Neg  Hx    Pancreatic cancer Neg Hx    Colon cancer Neg Hx    Colon polyps Neg Hx    Esophageal cancer Neg Hx    Prostate cancer Neg Hx    Breast cancer Neg Hx       Review of Systems: All other systems reviewed and are otherwise negative except as noted above.  Physical Exam: Vitals:   10/10/22 0027 10/10/22 0432 10/10/22 0500 10/10/22 0745  BP: 131/60 (!) 142/59  133/60  Pulse: 63 64  60  Resp: 18 18  17   Temp: 98.1 F (36.7 C) 98 F (36.7 C)  98 F (36.7 C)  TempSrc: Oral Oral  Oral  SpO2: 96% 96%  96%  Weight:   94.7 kg   Height:        GEN- The patient is well appearing, alert and oriented x 3 today.   Head- normocephalic, atraumatic Eyes-  Sclera clear, conjunctiva pink Ears- hearing intact Oropharynx- clear Neck- supple Lungs- CTA b/l, normal work of breathing Heart- RRR, no murmurs, rubs or gallops  GI- soft, NT, ND Extremities- no clubbing, cyanosis, or edema MS- no significant deformity or atrophy Skin- no rash or lesion Psych- euthymic mood, full affect   Labs:   Lab Results  Component Value Date   WBC 8.5 10/09/2022   HGB 12.5 (L) 10/09/2022   HCT 37.7 (L) 10/09/2022   MCV 90.0 10/09/2022   PLT 172 10/09/2022    Recent Labs  Lab 10/08/22 1642 10/09/22 0625  NA 137 138  K 3.4* 3.8  CL 100 102  CO2 28 25  BUN 22 14  CREATININE 0.90 0.86  CALCIUM 9.7 9.3  PROT 7.1  --   BILITOT 1.5*  --   ALKPHOS 51  --   ALT 16  --   AST 17  --   GLUCOSE 96 85   Lab Results  Component Value Date   TROPONINI <0.03 07/24/2017   Lab Results  Component Value Date   CHOL 80 10/09/2022   CHOL 132 07/25/2017   CHOL 159 02/10/2017   Lab Results  Component Value Date   HDL 18 (L) 10/09/2022   HDL 37 (L) 07/25/2017   HDL 67 02/10/2017   Lab Results  Component Value Date  LDLCALC 45 10/09/2022   LDLCALC 75 07/25/2017   LDLCALC 58 02/10/2017   Lab Results  Component Value Date   TRIG 85 10/09/2022   TRIG 102 07/25/2017   TRIG 169 (H)  02/10/2017   Lab Results  Component Value Date   CHOLHDL 4.4 10/09/2022   CHOLHDL 3.6 07/25/2017   CHOLHDL 2.4 02/10/2017   No results found for: "LDLDIRECT"  No results found for: "DDIMER"   Radiology/Studies:  VAS [place] LOWER EXTREMITY VENOUS (DVT) Result Date: 10/09/2022  Lower Venous DVT Study Patient Name:  ALTAIR REES  Date of Exam:   10/09/2022 Medical Rec #: [id number]         Accession #:    [id number] Date of Birth: [birth year]         Patient Gender: M Patient Age:   51 years Exam Location:  [facility] Procedure:      VAS [place] LOWER EXTREMITY VENOUS (DVT) Referring Phys: Scheryl Marten XU --------------------------------------------------------------------------------  Indications: Stroke.  Comparison Study: No prior studies. Performing Technologist: Jean Rosenthal RDMS, RVT  Examination Guidelines: A complete evaluation includes B-mode imaging, spectral Doppler, color Doppler, and power Doppler as needed of all accessible portions of each vessel. Bilateral testing is considered an integral part of a complete examination. Limited examinations for reoccurring indications may be performed as noted. The reflux portion of the exam is performed with the patient in reverse Trendelenburg.    Summary: RIGHT: - There is no evidence of deep vein thrombosis in the lower extremity.  - No cystic structure found in the popliteal fossa.  LEFT: - There is no evidence of deep vein thrombosis in the lower extremity.  - No cystic structure found in the popliteal fossa.  *See table(s) above for measurements and observations. Electronically signed by Heath Lark on 10/09/2022 at 5:38:22 PM.    Final      CT ANGIO HEAD NECK W WO CM Result Date: 10/08/2022 CLINICAL DATA:  Acute infarcts in the medial parietal lobe bilaterally. EXAM: CT ANGIOGRAPHY HEAD AND NECK WITH AND WITHOUT CONTRAST TECHNIQUE: Multidetector CT imaging of the head and neck was performed using the standard protocol during bolus administration  of intravenous contrast. Multiplanar CT image reconstructions and MIPs were obtained to evaluate the vascular anatomy. Carotid stenosis measurements (when applicable) are obtained utilizing NASCET criteria, using the distal internal carotid diameter as the denominator. RADIATION DOSE REDUCTION: This exam was performed according to the departmental dose-optimization program which includes automated exposure control, adjustment of the mA and/or kV according to patient size and/or use of iterative reconstruction technique. CONTRAST:  75mL OMNIPAQUE IOHEXOL 350 MG/ML SOLN COMPARISON:  No prior CTA available, correlation is made with 10/08/2022 MRI head and 07/23/2017 CT head FINDINGS: CT HEAD FINDINGS Brain: No CT correlate is seen for the small acute infarcts noted on the same-day MRI. No evidence of additional acute infarct, hemorrhage, mass, mass effect, or midline shift. No hydrocephalus or extra-axial fluid collection. Vascular: No hyperdense vessel. Skull: Negative for fracture or focal lesion. Sinuses/Orbits: Mucosal thickening in the ethmoid air cells. No acute finding in the orbits. Other: The mastoid air cells are well aerated. CTA NECK FINDINGS Aortic arch: Two-vessel arch with a common origin of the brachiocephalic and left common carotid arteries. Imaged portion shows no evidence of aneurysm or dissection. No significant stenosis of the major arch vessel origins. Right carotid system: No evidence of dissection, occlusion, or hemodynamically significant stenosis (greater than 50%). Left carotid system: No evidence of dissection, occlusion, or hemodynamically  significant stenosis (greater than 50%). Vertebral arteries: No evidence of dissection, occlusion, or hemodynamically significant stenosis (greater than 50%). Skeleton: No acute osseous abnormality. Status post ACDF C5-C7 and posterior fusion C4-C7. Other neck: Redemonstrated 19 mm right thyroid nodule, which appears unchanged on exams dating back to  2014, favored to be benign. Patent Upper chest: No focal pulmonary opacity or pleural effusion. Review of the MIP images confirms the above findings CTA HEAD FINDINGS Anterior circulation: Both internal carotid arteries are patent to the termini, without significant stenosis. Left A1. Aplastic right A1. Normal anterior communicating artery. Asymmetrically narrow right A3 (series 14, images 231-232), which could represent stenosis versus asymmetric branching. Anterior cerebral arteries are otherwise patent to their distal aspects without significant stenosis. No M1 stenosis or occlusion. MCA branches perfused to their distal aspects without significant stenosis. Posterior circulation: Vertebral arteries patent to the vertebrobasilar junction without significant stenosis. Posterior inferior cerebellar arteries patent proximally. Basilar patent to its distal aspect without significant stenosis. Superior cerebellar arteries patent proximally. Patent P1 segments. PCAs perfused to their distal aspects without significant stenosis. The bilateral posterior communicating arteries are not visualized. Venous sinuses: Well opacified, patent. Anatomic variants: None significant. Review of the MIP images confirms the above findings IMPRESSION: 1. No intracranial large vessel occlusion. Asymmetrically narrow right A3, which could represent stenosis versus asymmetric branching. 2. No hemodynamically significant stenosis in the neck. Electronically Signed   By: Wiliam Ke M.D.   On: 10/08/2022 23:18   MR Brain Wo Contrast (neuro protocol) Result Date: 10/08/2022 CLINICAL DATA:  Neuro deficit, acute, stroke suspected EXAM: MRI HEAD WITHOUT CONTRAST TECHNIQUE: Multiplanar, multiecho pulse sequences of the brain and surrounding structures were obtained without intravenous contrast. COMPARISON:  CT Head 07/23/17 FINDINGS: Brain: There are acute infarcts in the bilateral ACA territory, left-greater-than-right involving the  parasagittal bilateral parietal lobes (series 9, image 30-27). No hemorrhage. No extra-axial fluid collection. No hydrocephalus. Sequela of mild overall chronic microvascular ischemic change. No mass effect. No midline shift. Vascular: Normal flow voids. Skull and upper cervical spine: Normal marrow signal. Sinuses/Orbits: No mastoid or middle ear effusion. Paranasal sinuses are notable for mucosal thickening in the bilateral ethmoid sinuses. Orbits are unremarkable. Other: None. IMPRESSION: Acute bilateral ACA territory infarcts (left-greater-than-right) involving the parasagittal aspects of the bilateral parietal lobes. No hemorrhage. Electronically Signed   By: Lorenza Cambridge M.D.   On: 10/08/2022 18:31    12-lead ECG SR All prior EKG's in EPIC reviewed with no documented atrial fibrillation  Telemetry SR  Assessment and Plan:  1. Cryptogenic stroke The patient presents with cryptogenic stroke.  Dr. Ladona Ridgel has seen and spoken at length with the patient about monitoring for afib with either a 30 day event monitor or an implantable loop recorder.  Risks, benefits, and alteratives to implantable loop recorder were discussed with the patient today.   At this time, the patient is very clear in their decision to proceed with implantable loop recorder.   Wound care was reviewed with the patient (keep incision clean and dry for 3 days).   Please call with questions.   Renee Norberto Sorenson, PA-C 10/10/2022  EP Attending  Patient seen and examined. Agree with the findings as noted above. The patient has sustained a cryptogenic stroke. Workup as to the etiology has been unrevealing. I have discussed the treatment options in detail and the indications/risks/benefits of ILR insertion were reviewed and he wishes to proceed.  Sharlot Gowda Cia Garretson,MD

## 2022-10-10 NOTE — Discharge Summary (Signed)
Physician Discharge Summary   Patient: Kenneth [name] MRN: [id number] DOB: 10/15/1950  Admit date:     10/08/2022  Discharge date: 10/10/22  Discharge Physician: Alberteen Sam   PCP: [name], MD     Recommendations at discharge:  Follow-up with [facility] neurology for new stroke in 6 weeks Follow-up with PCP Dr. Waynard Edwards 1 week after discharge from rehab for stroke Dr. Waynard Edwards: Please adjust blood pressure medicines as necessary Please revisit CPAP if patient willing Plan for 3 weeks DAPT then Plavix alone indefinitely      Discharge Diagnoses: Principal Problem:   Acute CVA (cerebrovascular accident) [facility]) Active Problems:   Controlled type 2 diabetes mellitus without complication, without long-term current use of insulin (HCC)   Mixed hyperlipidemia   OSA (obstructive sleep apnea)   Obesity (BMI 30-39.9)   Essential hypertension   Chronic diastolic CHF (congestive heart failure) [facility])      Hospital Course: Kenneth [name] is a 72 y.o. M with DM, HTN, HLD, prosCA, dCHF, OSA, and obesity who presented with acute leg weakness.  Woke with bilateral paresthesias and weakness in legs, L>R, and fell.  In the ER, MRI showed bilateral ACA infarcts.     * Acute CVA (cerebrovascular accident) (HCC) MRI brain showed acute infarcts in the bilateral ACA territory, left-greater-than-right involving the parasagittal bilateral parietal lobes - Non-invasive angiography showed narrowing of A3, no LVO, carotids unremarkable - Echocardiogram showed no cardiogenic source of embolism - Lower extremity Dopplers negative for DVT  - Lipids ordered: LDL 45, continue home atorvastatin 40 mg  - Aspirin and PLavix ordered at admission, discharged on DAPT 3 weeks then Plavix alone indefinitely - Atrial fibrillation: not present on telemetry, no history, ILR placed at discharge - tPA not given because outside window - Dysphagia screen ordered in ER - PT eval ordered: CIR - Nonsmoker      Chronic diastolic CHF (congestive heart failure) (HCC) Appeared euvolemic  Essential hypertension BP low normal off meds here.   - Resume BP meds in 1-2 days to achieve goal <130/80 - Defer titration to inpatient rehab team.    Obesity (BMI 30-39.9) BMI 31  OSA (obstructive sleep apnea) Not on CPAP  Mixed hyperlipidemia On Lipitor.  LDL at goal.  Controlled type 2 diabetes mellitus without complication, without long-term current use of insulin (HCC) Glucose controlled here on glargine, SS corrections. Resume home meds at discharge.           The [facility] Controlled Substances Registry was reviewed for this patient prior to discharge.  Consultants: Neurology  Procedures performed:  `- CT head - MRI brain - CTA head and neck - Echocardiogram - LE dopplers   Disposition: Rehabilitation facility Diet recommendation:  Cardiac and Carb modified diet  DISCHARGE MEDICATION: Allergies as of 10/10/2022       Reactions   Jardiance [empagliflozin]    unknown   Penicillins    CHILDHOOD ALLERGY Has patient had a PCN reaction causing immediate rash, facial/tongue/throat swelling, SOB or lightheadedness with hypotension: Unknown Has patient had a PCN reaction causing severe rash involving mucus membranes or skin necrosis: Unknown Has patient had a PCN reaction that required hospitalization: Unknown Has patient had a PCN reaction occurring within the last 10 years: Unknown If all of the above answers are "NO", then may proceed with Cephalosporin use.        Medication List     TAKE these medications    acetaminophen 500 MG tablet Commonly known as: TYLENOL  Take 500 mg by mouth daily as needed for headache.   aspirin 81 MG tablet Take 81 mg by mouth every evening.   atorvastatin 40 MG tablet Commonly known as: LIPITOR Take 1 tablet (40 mg total) by mouth daily.   B-D UF III MINI PEN NEEDLES 31G X 5 MM Misc Generic drug: Insulin Pen  Needle Inject 1 Stick as directed as directed.   benazepril 40 MG tablet Commonly known as: LOTENSIN Take 1 tablet (40 mg total) by mouth daily. Please schedule appt for future refills. 1st attempt   buPROPion ER 100 MG 12 hr tablet Commonly known as: WELLBUTRIN SR Take 100 mg by mouth daily.   chlorthalidone 25 MG tablet Commonly known as: HYGROTON Take 25 mg by mouth daily.   clopidogrel 75 MG tablet Commonly known as: PLAVIX Take 1 tablet (75 mg total) by mouth daily. Start taking on: October 11, 2022   ezetimibe 10 MG tablet Commonly known as: ZETIA Take 10 mg by mouth daily.   metFORMIN 500 MG tablet Commonly known as: GLUCOPHAGE Take 500 mg by mouth 2 (two) times daily with a meal.   Mounjaro 5 MG/0.5ML Pen Generic drug: tirzepatide Inject 5 mg into the skin once a week.   Prolia 60 MG/ML Sosy injection Generic drug: denosumab Inject 60 mg into the skin every 6 (six) months.   tamsulosin 0.4 MG Caps capsule Commonly known as: FLOMAX Take 0.4 mg by mouth daily. Patient states he is taking it twice daily   Toujeo SoloStar 300 UNIT/ML Solostar Pen Generic drug: insulin glargine (1 Unit Dial) Inject 36 Units into the skin daily.   VITAMIN D PO Take 1 tablet by mouth 2 (two) times daily. Twice a day pr patient        Follow-up Information     [facility] Follow up.   Contact information: [address]     [address] [place] [address] [phone]        [name], MD Follow up.   Specialty: Internal Medicine Contact information: [address] [place] [address] [phone]                 Discharge Instructions     Ambulatory referral to Neurology   Complete by: As directed    An appointment is requested in approximately: 8 weeks Hospital stroke follow up   Increase activity slowly   Complete by: As directed        Discharge Exam: Filed Weights   10/08/22 1535 10/10/22 0500   Weight: 101 kg 94.7 kg    General: Pt is alert, awake, not in acute distress Cardiovascular: RRR, nl S1-S2, no murmurs appreciated.   No LE edema.   Respiratory: Normal respiratory rate and rhythm.  CTAB without rales or wheezes. Abdominal: Abdomen soft and non-tender.  No distension or HSM.   Neuro/Psych: Strength symmetric in upper and lower extremities.  Judgment and insight appear normal.   Condition at discharge: stable  The results of significant diagnostics from this hospitalization (including imaging, microbiology, ancillary and laboratory) are listed below for reference.   Imaging Studies: VAS [place] LOWER EXTREMITY VENOUS (DVT)  Result Date: 10/09/2022  Lower Venous DVT Study Patient Name:  Kenneth [name]  Date of Exam:   10/09/2022 Medical Rec #: [id number]         Accession #:    [id number] Date of Birth: 09-23-1950         Patient Gender: M Patient Age:  71 years Exam Location:  [facility] Procedure:      VAS [place] LOWER EXTREMITY VENOUS (DVT) Referring Phys: Scheryl Marten XU --------------------------------------------------------------------------------  Indications: Stroke.  Comparison Study: No prior studies. Performing Technologist: Jean Rosenthal RDMS, RVT  Examination Guidelines: A complete evaluation includes B-mode imaging, spectral Doppler, color Doppler, and power Doppler as needed of all accessible portions of each vessel. Bilateral testing is considered an integral part of a complete examination. Limited examinations for reoccurring indications may be performed as noted. The reflux portion of the exam is performed with the patient in reverse Trendelenburg.  +---------+---------------+---------+-----------+----------+--------------+ RIGHT    CompressibilityPhasicitySpontaneityPropertiesThrombus Aging +---------+---------------+---------+-----------+----------+--------------+ CFV      Full           Yes      Yes                                  +---------+---------------+---------+-----------+----------+--------------+ SFJ      Full                                                        +---------+---------------+---------+-----------+----------+--------------+ FV Prox  Full                                                        +---------+---------------+---------+-----------+----------+--------------+ FV Mid   Full                                                        +---------+---------------+---------+-----------+----------+--------------+ FV DistalFull                                                        +---------+---------------+---------+-----------+----------+--------------+ PFV      Full                                                        +---------+---------------+---------+-----------+----------+--------------+ POP      Full           Yes      Yes                                 +---------+---------------+---------+-----------+----------+--------------+ PTV      Full                                                        +---------+---------------+---------+-----------+----------+--------------+ PERO     Full                                                        +---------+---------------+---------+-----------+----------+--------------+  Gastroc  Full                                                        +---------+---------------+---------+-----------+----------+--------------+   +---------+---------------+---------+-----------+----------+--------------+ LEFT     CompressibilityPhasicitySpontaneityPropertiesThrombus Aging +---------+---------------+---------+-----------+----------+--------------+ CFV      Full           Yes      Yes                                 +---------+---------------+---------+-----------+----------+--------------+ SFJ      Full                                                         +---------+---------------+---------+-----------+----------+--------------+ FV Prox  Full                                                        +---------+---------------+---------+-----------+----------+--------------+ FV Mid   Full                                                        +---------+---------------+---------+-----------+----------+--------------+ FV DistalFull                                                        +---------+---------------+---------+-----------+----------+--------------+ PFV      Full                                                        +---------+---------------+---------+-----------+----------+--------------+ POP      Full           Yes      Yes                                 +---------+---------------+---------+-----------+----------+--------------+ PTV      Full                                                        +---------+---------------+---------+-----------+----------+--------------+ PERO     Full                                                        +---------+---------------+---------+-----------+----------+--------------+  Gastroc  Full                                                        +---------+---------------+---------+-----------+----------+--------------+     Summary: RIGHT: - There is no evidence of deep vein thrombosis in the lower extremity.  - No cystic structure found in the popliteal fossa.  LEFT: - There is no evidence of deep vein thrombosis in the lower extremity.  - No cystic structure found in the popliteal fossa.  *See table(s) above for measurements and observations. Electronically signed by Heath Lark on 10/09/2022 at 5:38:22 PM.    Final    ECHOCARDIOGRAM COMPLETE BUBBLE STUDY  Result Date: 10/09/2022    ECHOCARDIOGRAM REPORT   Patient Name:   Kenneth [name] Date of Exam: 10/09/2022 Medical Rec #:  [id number]        Height:       71.0 in Accession #:    [id number]       Weight:        222.7 lb Date of Birth:  Jun 16, 1950        BSA:          2.207 m Patient Age:    71 years         BP:           111/59 mmHg Patient Gender: M                HR:           62 bpm. Exam Location:  Inpatient Procedure: 2D Echo, Cardiac Doppler, Color Doppler and Saline Contrast Bubble            Study Indications:    Stroke  History:        Patient has prior history of Echocardiogram examinations. CHF,                 Abnormal ECG, Stroke and Pulmonary HTN, Signs/Symptoms:Syncope;                 Risk Factors:Diabetes, Dyslipidemia, Sleep Apnea and                 Hypertension. Cancer.  Sonographer:    Sheralyn Boatman RDCS Referring Phys: [id number] CAROLE N HALL IMPRESSIONS  1. Left ventricular ejection fraction, by estimation, is 55 to 60%. The left ventricle has normal function. The left ventricle has no regional wall motion abnormalities. Left ventricular diastolic parameters are consistent with Grade I diastolic dysfunction (impaired relaxation).  2. Right ventricular systolic function is normal. The right ventricular size is normal. There is normal pulmonary artery systolic pressure. The estimated right ventricular systolic pressure is 29.4 mmHg.  3. The mitral valve is normal in structure. No evidence of mitral valve regurgitation. No evidence of mitral stenosis.  4. The aortic valve is tricuspid. There is moderate calcification of the aortic valve. There is moderate thickening of the aortic valve. Aortic valve regurgitation is not visualized. Aortic valve sclerosis/calcification is present, without any evidence of aortic stenosis.  5. The inferior vena cava is normal in size with greater than 50% respiratory variability, suggesting right atrial pressure of 3 mmHg.  6. Agitated saline contrast bubble study was negative, with no evidence of any interatrial shunt. Comparison(s): No significant change from prior study. Conclusion(s)/Recommendation(s): No intracardiac source of embolism detected  on this transthoracic  study. Consider a transesophageal echocardiogram to exclude cardiac source of embolism if clinically indicated. FINDINGS  Left Ventricle: Left ventricular ejection fraction, by estimation, is 55 to 60%. The left ventricle has normal function. The left ventricle has no regional wall motion abnormalities. The left ventricular internal cavity size was normal in size. There is  no left ventricular hypertrophy. Left ventricular diastolic parameters are consistent with Grade I diastolic dysfunction (impaired relaxation). Right Ventricle: The right ventricular size is normal. No increase in right ventricular wall thickness. Right ventricular systolic function is normal. There is normal pulmonary artery systolic pressure. The tricuspid regurgitant velocity is 2.57 m/s, and  with an assumed right atrial pressure of 3 mmHg, the estimated right ventricular systolic pressure is 29.4 mmHg. Left Atrium: Left atrial size was normal in size. Right Atrium: Right atrial size was normal in size. Pericardium: There is no evidence of pericardial effusion. Mitral Valve: The mitral valve is normal in structure. No evidence of mitral valve regurgitation. No evidence of mitral valve stenosis. Tricuspid Valve: The tricuspid valve is normal in structure. Tricuspid valve regurgitation is mild. Aortic Valve: The aortic valve is tricuspid. There is moderate calcification of the aortic valve. There is moderate thickening of the aortic valve. Aortic valve regurgitation is not visualized. Aortic valve sclerosis/calcification is present, without any  evidence of aortic stenosis. Aortic valve mean gradient measures 7.0 mmHg. Aortic valve peak gradient measures 13.8 mmHg. Aortic valve area, by VTI measures 2.81 cm. Pulmonic Valve: The pulmonic valve was normal in structure. Pulmonic valve regurgitation is trivial. Aorta: The aortic root is normal in size and structure. Venous: The inferior vena cava is normal in size with greater than 50% respiratory  variability, suggesting right atrial pressure of 3 mmHg. IAS/Shunts: The atrial septum is grossly normal. Agitated saline contrast was given intravenously to evaluate for intracardiac shunting. Agitated saline contrast bubble study was negative, with no evidence of any interatrial shunt.  LEFT VENTRICLE PLAX 2D LVIDd:         5.60 cm   Diastology LVIDs:         3.60 cm   LV e' medial:    10.90 cm/s LV PW:         0.80 cm   LV E/e' medial:  7.9 LV IVS:        1.00 cm   LV e' lateral:   14.90 cm/s LVOT diam:     2.40 cm   LV E/e' lateral: 5.8 LV SV:         114 LV SV Index:   52 LVOT Area:     4.52 cm  RIGHT VENTRICLE             IVC RV S prime:     14.30 cm/s  IVC diam: 2.00 cm TAPSE (M-mode): 1.6 cm LEFT ATRIUM             Index        RIGHT ATRIUM          Index LA diam:        3.80 cm 1.72 cm/m   RA Area:     8.98 cm LA Vol (A2C):   28.0 ml 12.69 ml/m  RA Volume:   13.00 ml 5.89 ml/m LA Vol (A4C):   23.4 ml 10.60 ml/m LA Biplane Vol: 26.1 ml 11.83 ml/m  AORTIC VALVE AV Area (Vmax):    2.78 cm AV Area (Vmean):   2.79 cm AV Area (VTI):  2.81 cm AV Vmax:           185.50 cm/s AV Vmean:          122.500 cm/s AV VTI:            0.406 m AV Peak Grad:      13.8 mmHg AV Mean Grad:      7.0 mmHg LVOT Vmax:         114.00 cm/s LVOT Vmean:        75.600 cm/s LVOT VTI:          0.252 m LVOT/AV VTI ratio: 0.62  AORTA Ao Root diam: 3.40 cm Ao Asc diam:  3.50 cm MITRAL VALVE               TRICUSPID VALVE MV Area (PHT): 2.99 cm    TR Peak grad:   26.4 mmHg MV Decel Time: 254 msec    TR Vmax:        257.00 cm/s MV E velocity: 86.50 cm/s MV A velocity: 85.20 cm/s  SHUNTS MV E/A ratio:  1.02        Systemic VTI:  0.25 m                            Systemic Diam: 2.40 cm Laurance Flatten MD Electronically signed by Laurance Flatten MD Signature Date/Time: 10/09/2022/2:56:29 PM    Final    CT ANGIO HEAD NECK W WO CM  Result Date: 10/08/2022 CLINICAL DATA:  Acute infarcts in the medial parietal lobe bilaterally. EXAM:  CT ANGIOGRAPHY HEAD AND NECK WITH AND WITHOUT CONTRAST TECHNIQUE: Multidetector CT imaging of the head and neck was performed using the standard protocol during bolus administration of intravenous contrast. Multiplanar CT image reconstructions and MIPs were obtained to evaluate the vascular anatomy. Carotid stenosis measurements (when applicable) are obtained utilizing NASCET criteria, using the distal internal carotid diameter as the denominator. RADIATION DOSE REDUCTION: This exam was performed according to the departmental dose-optimization program which includes automated exposure control, adjustment of the mA and/or kV according to patient size and/or use of iterative reconstruction technique. CONTRAST:  75mL OMNIPAQUE IOHEXOL 350 MG/ML SOLN COMPARISON:  No prior CTA available, correlation is made with 10/08/2022 MRI head and 07/23/2017 CT head FINDINGS: CT HEAD FINDINGS Brain: No CT correlate is seen for the small acute infarcts noted on the same-day MRI. No evidence of additional acute infarct, hemorrhage, mass, mass effect, or midline shift. No hydrocephalus or extra-axial fluid collection. Vascular: No hyperdense vessel. Skull: Negative for fracture or focal lesion. Sinuses/Orbits: Mucosal thickening in the ethmoid air cells. No acute finding in the orbits. Other: The mastoid air cells are well aerated. CTA NECK FINDINGS Aortic arch: Two-vessel arch with a common origin of the brachiocephalic and left common carotid arteries. Imaged portion shows no evidence of aneurysm or dissection. No significant stenosis of the major arch vessel origins. Right carotid system: No evidence of dissection, occlusion, or hemodynamically significant stenosis (greater than 50%). Left carotid system: No evidence of dissection, occlusion, or hemodynamically significant stenosis (greater than 50%). Vertebral arteries: No evidence of dissection, occlusion, or hemodynamically significant stenosis (greater than 50%). Skeleton: No  acute osseous abnormality. Status post ACDF C5-C7 and posterior fusion C4-C7. Other neck: Redemonstrated 19 mm right thyroid nodule, which appears unchanged on exams dating back to 2014, favored to be benign. Patent Upper chest: No focal pulmonary opacity or pleural effusion. Review of the MIP images confirms the above  findings CTA HEAD FINDINGS Anterior circulation: Both internal carotid arteries are patent to the termini, without significant stenosis. Left A1. Aplastic right A1. Normal anterior communicating artery. Asymmetrically narrow right A3 (series 14, images 231-232), which could represent stenosis versus asymmetric branching. Anterior cerebral arteries are otherwise patent to their distal aspects without significant stenosis. No M1 stenosis or occlusion. MCA branches perfused to their distal aspects without significant stenosis. Posterior circulation: Vertebral arteries patent to the vertebrobasilar junction without significant stenosis. Posterior inferior cerebellar arteries patent proximally. Basilar patent to its distal aspect without significant stenosis. Superior cerebellar arteries patent proximally. Patent P1 segments. PCAs perfused to their distal aspects without significant stenosis. The bilateral posterior communicating arteries are not visualized. Venous sinuses: Well opacified, patent. Anatomic variants: None significant. Review of the MIP images confirms the above findings IMPRESSION: 1. No intracranial large vessel occlusion. Asymmetrically narrow right A3, which could represent stenosis versus asymmetric branching. 2. No hemodynamically significant stenosis in the neck. Electronically Signed   By: Wiliam Ke M.D.   On: 10/08/2022 23:18   MR Brain Wo Contrast (neuro protocol)  Result Date: 10/08/2022 CLINICAL DATA:  Neuro deficit, acute, stroke suspected EXAM: MRI HEAD WITHOUT CONTRAST TECHNIQUE: Multiplanar, multiecho pulse sequences of the brain and surrounding structures were  obtained without intravenous contrast. COMPARISON:  CT Head 07/23/17 FINDINGS: Brain: There are acute infarcts in the bilateral ACA territory, left-greater-than-right involving the parasagittal bilateral parietal lobes (series 9, image 30-27). No hemorrhage. No extra-axial fluid collection. No hydrocephalus. Sequela of mild overall chronic microvascular ischemic change. No mass effect. No midline shift. Vascular: Normal flow voids. Skull and upper cervical spine: Normal marrow signal. Sinuses/Orbits: No mastoid or middle ear effusion. Paranasal sinuses are notable for mucosal thickening in the bilateral ethmoid sinuses. Orbits are unremarkable. Other: None. IMPRESSION: Acute bilateral ACA territory infarcts (left-greater-than-right) involving the parasagittal aspects of the bilateral parietal lobes. No hemorrhage. Electronically Signed   By: Lorenza Cambridge M.D.   On: 10/08/2022 18:31    Microbiology: Results for orders placed or performed during the hospital encounter of 07/23/17  Urine culture     Status: Abnormal   Collection Time: 07/23/17  3:15 PM   Specimen: Urine, Clean Catch  Result Value Ref Range Status   Specimen Description   Final    URINE, CLEAN CATCH Performed at [facility], [address] [place], [place] [address]    Special Requests   Final    NONE Performed at [facility], [address], [place], [place] [address]    Culture >=100,000 COLONIES/mL KLEBSIELLA PNEUMONIAE (A)  Final   Report Status 07/25/2017 FINAL  Final   Organism ID, Bacteria KLEBSIELLA PNEUMONIAE (A)  Final      Susceptibility   Klebsiella pneumoniae - MIC*    AMPICILLIN >=32 RESISTANT Resistant     CEFAZOLIN <=4 SENSITIVE Sensitive     CEFTRIAXONE <=1 SENSITIVE Sensitive     CIPROFLOXACIN <=0.25 SENSITIVE Sensitive     GENTAMICIN <=1 SENSITIVE Sensitive     IMIPENEM <=0.25 SENSITIVE Sensitive     NITROFURANTOIN <=16 SENSITIVE Sensitive     TRIMETH/SULFA <=20  SENSITIVE Sensitive     AMPICILLIN/SULBACTAM 8 SENSITIVE Sensitive     PIP/TAZO 8 SENSITIVE Sensitive     Extended ESBL NEGATIVE Sensitive     * >=100,000 COLONIES/mL KLEBSIELLA PNEUMONIAE  Respiratory Panel by PCR     Status: None   Collection Time: 07/23/17  3:15 PM   Specimen: Nasopharyngeal Swab; Respiratory  Result  Value Ref Range Status   Adenovirus NOT DETECTED NOT DETECTED Final   Coronavirus 229E NOT DETECTED NOT DETECTED Final   Coronavirus HKU1 NOT DETECTED NOT DETECTED Final   Coronavirus NL63 NOT DETECTED NOT DETECTED Final   Coronavirus OC43 NOT DETECTED NOT DETECTED Final   Metapneumovirus NOT DETECTED NOT DETECTED Final   Rhinovirus / Enterovirus NOT DETECTED NOT DETECTED Final   Influenza A NOT DETECTED NOT DETECTED Final   Influenza B NOT DETECTED NOT DETECTED Final   Parainfluenza Virus 1 NOT DETECTED NOT DETECTED Final   Parainfluenza Virus 2 NOT DETECTED NOT DETECTED Final   Parainfluenza Virus 3 NOT DETECTED NOT DETECTED Final   Parainfluenza Virus 4 NOT DETECTED NOT DETECTED Final   Respiratory Syncytial Virus NOT DETECTED NOT DETECTED Final   Bordetella pertussis NOT DETECTED NOT DETECTED Final   Chlamydophila pneumoniae NOT DETECTED NOT DETECTED Final   Mycoplasma pneumoniae NOT DETECTED NOT DETECTED Final    Comment: Performed at [facility], [address] [place], [place] [address]  Culture, blood (routine x 2)     Status: None   Collection Time: 07/24/17  1:48 PM   Specimen: BLOOD RIGHT HAND  Result Value Ref Range Status   Specimen Description   Final    BLOOD RIGHT HAND Performed at [facility], [address], [place], [place] [address]    Special Requests   Final    BOTTLES DRAWN AEROBIC AND ANAEROBIC Blood Culture adequate volume Performed at [facility], [address] [place], [place] [address]    Culture   Final    NO GROWTH 5 DAYS Performed at [facility], [address], [place], [place]  [address]    Report Status 07/29/2017 FINAL  Final  Culture, blood (routine x 2)     Status: None   Collection Time: 07/24/17  1:48 PM   Specimen: BLOOD RIGHT HAND  Result Value Ref Range Status   Specimen Description   Final    BLOOD RIGHT HAND Performed at [facility], [address], [place], [place] [address]    Special Requests   Final    BOTTLES DRAWN AEROBIC AND ANAEROBIC Blood Culture adequate volume Performed at [facility], [address] [place], [place] [address]    Culture   Final    NO GROWTH 5 DAYS Performed at [facility], [address], [place], [place] [address]    Report Status 07/29/2017 FINAL  Final    Labs: CBC: Recent Labs  Lab 10/08/22 1642 10/09/22 0625  WBC 9.3 8.5  NEUTROABS 6.1  --   HGB 12.4* 12.5*  HCT 37.9* 37.7*  MCV 90.5 90.0  PLT 180 172   Basic Metabolic Panel: Recent Labs  Lab 10/08/22 1642 10/09/22 0625  NA 137 138  K 3.4* 3.8  CL 100 102  CO2 28 25  GLUCOSE 96 85  BUN 22 14  CREATININE 0.90 0.86  CALCIUM 9.7 9.3  MG  --  1.7  PHOS  --  3.9   Liver Function Tests: Recent Labs  Lab 10/08/22 1642  AST 17  ALT 16  ALKPHOS 51  BILITOT 1.5*  PROT 7.1  ALBUMIN 3.8   CBG: Recent Labs  Lab 10/08/22 1544 10/09/22 1225 10/09/22 1628 10/09/22 2115 10/10/22 0631  GLUCAP 102* 101* 79 100* 96    Discharge time spent: approximately 35 minutes spent on discharge counseling, evaluation of patient on day  of discharge, and coordination of discharge planning with nursing, social work, pharmacy and case management  Signed: Alberteen Sam, MD [facility] 10/10/2022

## 2022-10-10 NOTE — Progress Notes (Signed)
Physical Therapy Treatment Patient Details Name: Kenneth [name] MRN: [id number] DOB: [birth year] Today's Date: 10/10/2022   History of Present Illness Pt is a 72 y.o. 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 He presented to the ED from home after a fall with c/o BLE weakness. PMH: congenital hearing loss, DM, HTN, hyperlipidemia, sleep apnea, prostate cancer    PT Comments    Focused session on performing exercises to improve pt's strength and balance to progress his functional mobility safety and independence. He appeared to be a little weaker in the R leg compared to the L when noting his gait pattern this date. Will continue to follow acutely.     Recommendations for follow up therapy are one component of a multi-disciplinary discharge planning process, led by the attending physician.  Recommendations may be updated based on patient status, additional functional criteria and insurance authorization.  Follow Up Recommendations       Assistance Recommended at Discharge Intermittent Supervision/Assistance  Patient can return home with the following Assistance with cooking/housework;Assist for transportation;Help with stairs or ramp for entrance;A little help with bathing/dressing/bathroom;A little help with walking and/or transfers   Equipment Recommendations  Rolling walker (2 wheels)    Recommendations for Other Services       Precautions / Restrictions Precautions Precautions: Fall Restrictions Weight Bearing Restrictions: No     Mobility  Bed Mobility               General bed mobility comments: Pt up in recliner upon arrival.    Transfers Overall transfer level: Needs assistance Equipment used: Rolling walker (2 wheels) Transfers: Sit to/from Stand Sit to Stand: Min assist           General transfer comment: MinA for stability coming to stand from recliner, x11 reps.    Ambulation/Gait Ambulation/Gait assistance: Min assist Gait Distance (Feet): 160 Feet  (x2 bouts of ~160 ft > ~40 ft) Assistive device: Rolling walker (2 wheels), None Gait Pattern/deviations: Step-through pattern, Narrow base of support, Decreased stride length, Drifts right/left (circumduction on R; R leg externally rotated) Gait velocity: decreased Gait velocity interpretation: 1.31 - 2.62 ft/sec, indicative of limited community ambulator   General Gait Details: Pt ambulates with his R leg externally rotated and occasional circumduction of R leg. Cues provided for straightening out rotation and swing of leg, mod success noted. MinA for balance when using a RW and for brief bouts of no AD in the room.   Stairs             Wheelchair Mobility    Modified Rankin (Stroke Patients Only) Modified Rankin (Stroke Patients Only) Pre-Morbid Rankin Score: No symptoms Modified Rankin: Moderately severe disability     Balance Overall balance assessment: Needs assistance Sitting-balance support: No upper extremity supported, Feet supported Sitting balance-Leahy Scale: Good     Standing balance support: During functional activity, No upper extremity supported, Bilateral upper extremity supported Standing balance-Leahy Scale: Fair Standing balance comment: Reliant on up to minA for balance             High level balance activites: Side stepping, Backward walking High Level Balance Comments: x10 side steps bil and x10 backwards steps without UE support, minA for balance            Cognition Arousal/Alertness: Awake/alert Behavior During Therapy: WFL for tasks assessed/performed Overall Cognitive Status: Within Functional Limits for tasks assessed  Exercises Other Exercises Other Exercises: x10 side steps bil and x10 backwards steps without UE support, minA for balance Other Exercises: sit <> stand 10x from recliner with emphasis to push through UEs Other Exercises: squats at sink with bil UE support,  x10 Other Exercises: lunges at sink with bil UE support, x10 bil Other Exercises: heel raises at sink with bil UE support, x10    General Comments        Pertinent Vitals/Pain Pain Assessment Pain Assessment: Faces Faces Pain Scale: No hurt Pain Intervention(s): Monitored during session    Home Living                          Prior Function            PT Goals (current goals can now be found in the care plan section) Acute Rehab PT Goals Patient Stated Goal: home, independence PT Goal Formulation: With patient Time For Goal Achievement: 10/23/22 Potential to Achieve Goals: Good Progress towards PT goals: Progressing toward goals    Frequency    Min 4X/week      PT Plan Current plan remains appropriate    Co-evaluation              AM-PAC PT "6 Clicks" Mobility   Outcome Measure  Help needed turning from your back to your side while in a flat bed without using bedrails?: None Help needed moving from lying on your back to sitting on the side of a flat bed without using bedrails?: A Little Help needed moving to and from a bed to a chair (including a wheelchair)?: A Little Help needed standing up from a chair using your arms (e.g., wheelchair or bedside chair)?: A Little Help needed to walk in hospital room?: A Little Help needed climbing 3-5 steps with a railing? : A Lot 6 Click Score: 18    End of Session Equipment Utilized During Treatment: Gait belt Activity Tolerance: Patient tolerated treatment well Patient left: in chair;with call bell/phone within reach   PT Visit Diagnosis: Unsteadiness on feet (R26.81);Difficulty in walking, not elsewhere classified (R26.2);Other abnormalities of gait and mobility (R26.89);Muscle weakness (generalized) (M62.81)     Time: 1610-9604 PT Time Calculation (min) (ACUTE ONLY): 17 min  Charges:  $Therapeutic Exercise: 8-22 mins                     Raymond Gurney, PT, DPT Acute Rehabilitation  Services  Office: [phone]    Jewel Baize 10/10/2022, 4:42 PM

## 2022-10-10 NOTE — Progress Notes (Signed)
Angelina Sheriff, DO  Physician Physical Medicine and Rehabilitation   PMR Pre-admission    Signed   Date of Service: 10/09/2022  4:32 PM  Related encounter: ED to Hosp-Admission (Current) from 10/08/2022 in [place] [facility]   Signed      Show:Clear all [x] Written[x] Templated[] Copied  Added by: [x] Mirage Pfefferkorn, Tye [place], RN[x] Angelina Sheriff, DO  [] Hover for details PMR Admission Coordinator Pre-Admission Assessment   Patient: Kenneth [name] is an 72 y.o., male MRN: [id number] DOB: 08-06-1950 Height: 5\' 11"  (180.3 cm) Weight: 94.7 kg   Insurance Information HMO:     PPO:      PCP:      IPA:      80/20:      OTHER:  PRIMARY: Medicare a and b      Policy#: [id number]      Subscriber: pt Benefits:  Phone #: passport one source online     Name: 5/30 Eff. Date: 02/09/2014     Deduct: $1632      Out of Pocket Max: none      Life Max: none CIR: 100%      SNF: 20 full days Outpatient: 80%     Co-Pay: 20% Home Health: 100%      Co-Pay: none DME: 80%     Co-Pay: 20% Providers: pt choice  SECONDARY: BCBS of [place] supplement      Policy#: [id number]   Financial Counselor:       Phone#:    The "Data Collection Information Summary" for patients in Inpatient Rehabilitation Facilities with attached "Privacy Act Statement-Health Care Records" was provided and verbally reviewed with: Patient   Emergency Contact Information Contact Information       Name Relation Home Work Mobile    [place] Sister     [phone]    Hessie Dibble Other     [phone]    Ellis,[name] Spouse [phone]   [phone]         Current Medical History  Patient Admitting Diagnosis: CVA   History of Present Illness: 72 year old amle with history of obesity, type 2 DM, essential HTN, HLD, BPH, OSA not using CPAP, malignant neoplasm of prostate s/p radiation treatment, posterior cervical fusion/anterior cervical decompression in 2014, chronic anxiety/depression, Chronic HFpEF  60 to 65%. Presented to [facility] ED on 5/29 due to bilateral lowere extremity weakness left greater than right. Transferred to [facility] on 5/30.   Imaging revealed bilateral ACA infarctions . Neurology consulted. On ASA at home. Plavix recommended for 3 months with ASA and then followed by Plavix alone. LDL 45 to continue atorvastatin. To allow for permissive HTN. Hold ACEi, chlorthal.Continue home glargine for type 2 DM. Hold mounjaro and metformin. SSI and CBG monitoring. LOOP to be placed prior to discharge.   Complete NIHSS TOTAL: 0   Patient's medical record from [facility] has been reviewed by the rehabilitation admission coordinator and physician.   Past Medical History      Past Medical History:  Diagnosis Date   Broken neck (HCC)     Diabetes mellitus     Hearing loss of both ears      Since birth   History of colon polyps - adenomas 04/03/2010   Hyperlipidemia     Hypertension     OSA (obstructive sleep apnea)      refusing CPAP   Prostate cancer (HCC)      Has the patient had major surgery during 100 days prior  to admission? No   Family History   family history includes Diabetes in his mother.   Current Medications   Current Facility-Administered Medications:    acetaminophen (TYLENOL) tablet 650 mg, 650 mg, Oral, Q6H PRN, Hall, Carole N, DO   aspirin EC tablet 81 mg, 81 mg, Oral, Daily, Dow Adolph N, DO, 81 mg at 10/10/22 0910   atorvastatin (LIPITOR) tablet 80 mg, 80 mg, Oral, Daily, Darlin Drop, DO, 80 mg at 10/10/22 0910   clopidogrel (PLAVIX) tablet 75 mg, 75 mg, Oral, Daily, Hall, Carole N, DO, 75 mg at 10/10/22 0910   enoxaparin (LOVENOX) injection 40 mg, 40 mg, Subcutaneous, Q24H, Hall, Carole N, DO, 40 mg at 10/09/22 2139   ezetimibe (ZETIA) tablet 10 mg, 10 mg, Oral, Daily, Hall, Carole N, DO, 10 mg at 10/10/22 0910   insulin aspart (novoLOG) injection 0-15 Units, 0-15 Units, Subcutaneous, TID WC, Danford, Earl Lites, MD    insulin aspart (novoLOG) injection 0-5 Units, 0-5 Units, Subcutaneous, QHS, Danford, Earl Lites, MD   insulin glargine-yfgn (SEMGLEE) injection 20 Units, 20 Units, Subcutaneous, Daily, Danford, Earl Lites, MD, 20 Units at 10/10/22 0910   labetalol (NORMODYNE) injection 5 mg, 5 mg, Intravenous, Q2H PRN, Hall, Carole N, DO   melatonin tablet 3 mg, 3 mg, Oral, QHS PRN, Hall, Carole N, DO   polyethylene glycol (MIRALAX / GLYCOLAX) packet 17 g, 17 g, Oral, Daily PRN, Hall, Carole N, DO   prochlorperazine (COMPAZINE) injection 5 mg, 5 mg, Intravenous, Q6H PRN, Dow Adolph N, DO   Patients Current Diet:  Diet Order                  Diet heart healthy/carb modified Room service appropriate? Yes; Fluid consistency: Thin  Diet effective now                       Precautions / Restrictions Precautions Precautions: Fall Restrictions Weight Bearing Restrictions: No    Has the patient had 2 or more falls or a fall with injury in the past year? No   Prior Activity Level Community (5-7x/wk): Independent, works as IT trainer, drives   Prior Functional Level Self Care: Did the patient need help bathing, dressing, using the toilet or eating? Independent   Indoor Mobility: Did the patient need assistance with walking from room to room (with or without device)? Independent   Stairs: Did the patient need assistance with internal or external stairs (with or without device)? Independent   Functional Cognition: Did the patient need help planning regular tasks such as shopping or remembering to take medications? Independent   Patient Information Are you of Hispanic, Latino/a,or Spanish origin?: A. No, not of Hispanic, Latino/a, or Spanish origin What is your race?: A. White Do you need or want an interpreter to communicate with a doctor or health care staff?: 0. No   Patient's Response To:  Health Literacy and Transportation Is the patient able to respond to health literacy and transportation  needs?: Yes Health Literacy - How often do you need to have someone help you when you read instructions, pamphlets, or other written material from your doctor or pharmacy?: Never In the past 12 months, has lack of transportation kept you from medical appointments or from getting medications?: No In the past 12 months, has lack of transportation kept you from meetings, work, or from getting things needed for daily living?: No   Journalist, newspaper / Equipment Home Assistive Devices/Equipment: CBG Meter (cgm) Home  Equipment: BSC/3in1, Information systems manager - built in, [employer] (2 wheels), Transport chair, [employer] - single point   Prior Device Use: Indicate devices/aids used by the patient prior to current illness, exacerbation or injury? None of the above   Current Functional Level Cognition   Overall Cognitive Status: Within Functional Limits for tasks assessed Orientation Level: Oriented X4 General Comments: midly labile but overall pleasant, WFL. shows some insight into deficits but also reports desire to go back to work on Monday    Extremity Assessment (includes Sensation/Coordination)   Upper Extremity Assessment: Overall WFL for tasks assessed  Lower Extremity Assessment: Defer to PT evaluation RLE Deficits / Details: 4+/5 RLE Sensation: decreased proprioception LLE Deficits / Details: 4/5 LLE Sensation: decreased proprioception     ADLs   Overall ADL's : Needs assistance/impaired Eating/Feeding: Independent Grooming: Min guard, Standing, Oral care, Wash/dry face, Brushing hair Grooming Details (indicate cue type and reason): able to stand without UE support during tasks. mild sway but able to correct Upper Body Bathing: Set up, Sitting Lower Body Bathing: Minimal assistance, Sit to/from stand Upper Body Dressing : Set up, Sitting Lower Body Dressing: Minimal assistance, Sit to/from stand Lower Body Dressing Details (indicate cue type and reason): able to cross LE to each socks,  some assist for balance in standing/donning around waist likely Toilet Transfer: Min guard, Ambulation, Rolling walker (2 wheels) Toilet Transfer Details (indicate cue type and reason): use of RW needed for stability, would need Min A for mobility without AD Toileting- Clothing Manipulation and Hygiene: Minimal assistance, Sit to/from stand, Sitting/lateral lean Functional mobility during ADLs: Min guard, Rolling walker (2 wheels) General ADL Comments: pt w/ balance and LE strength deficits requiring new DME use for mobility and increased time/effort for ADLs. Pt likely needing increased assist for IADLs and would benefit from higher level cognitive assessments     Mobility   General bed mobility comments: Pt up in recliner.     Transfers   Overall transfer level: Needs assistance Equipment used: Rolling walker (2 wheels) Transfers: Sit to/from Stand Sit to Stand: Min guard General transfer comment: improvements to min guard when pushing from armrests     Ambulation / Gait / Stairs / Wheelchair Mobility   Ambulation/Gait Ambulation/Gait assistance: Editor, commissioning (Feet): 50 Feet Assistive device: Rolling walker (2 wheels) Gait Pattern/deviations: Step-through pattern, Narrow base of support, Decreased stride length, Drifts right/left General Gait Details: slow, guarded gait. Amb initially with RW min guard straght level surface and min assist turning, managing obstacles. Short gait trial in room without AD: pt reaching for UE support, shuffle steps, unsteady Gait velocity: decreased Gait velocity interpretation: 1.31 - 2.62 ft/sec, indicative of limited community ambulator     Posture / Balance Balance Overall balance assessment: Needs assistance Sitting-balance support: No upper extremity supported, Feet supported Sitting balance-Leahy Scale: Good Standing balance support: During functional activity, No upper extremity supported Standing balance-Leahy Scale: Fair Standing  balance comment: reliant on external support. Improved stability with RW/UE support     Special needs/care consideration LOOP placement 5/31, Congenitally deaf - reads lips    Previous Home Environment  Living Arrangements: Spouse/significant other (wife currently hospitalized in LTAC since April)  Lives With: Spouse Available Help at Discharge: Available PRN/intermittently, Family Type of Home: House Home Layout: Two level, Able to live on main level with bedroom/bathroom Home Access: Stairs to enter Entrance Stairs-Rails: Can reach both, Left, Right Entrance Stairs-Number of Steps: 2 Bathroom Shower/Tub: Health visitor: Administrator  Accessibility: Yes How Accessible: Accessible via walker Home Care Services: No   Discharge Living Setting Plans for Discharge Living Setting: Patient's home, Lives with (comment) (wife) Type of Home at Discharge: House Discharge Home Layout: Two level, Able to live on main level with bedroom/bathroom Discharge Home Access: Stairs to enter Entrance Stairs-Rails: Left, Right, Can reach both Entrance Stairs-Number of Steps: 2 Discharge Bathroom Shower/Tub: Walk-in shower Discharge Bathroom Toilet: Standard Discharge Bathroom Accessibility: Yes How Accessible: Accessible via walker Does the patient have any problems obtaining your medications?: No   Social/Family/Support Systems 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 Hospitalized now for 7 weeks total Caregiver Availability: Other (Comment) Discharge Plan Discussed with Primary Caregiver: Yes Is Caregiver In Agreement with Plan?: Yes Does Caregiver/Family have Issues with Lodging/Transportation while Pt is in Rehab?: Yes   Home address is [address] [place], [place] Office address is [address] [place], [place]   Goals Patient/Family Goal for Rehab:  Mod I with PT and OT Expected length of stay: ELOS 7 to 10 days Pt/Family Agrees to Admission and willing to participate: Yes Program Orientation Provided & Reviewed with Pt/Caregiver Including Roles  & Responsibilities: Yes   Decrease burden of Care through IP rehab admission: n/a   Possible need for SNF placement upon discharge: not anticipated   Patient Condition: I have reviewed medical records [facility] and [facility], spoken with patient. I met with patient at the bedside for inpatient rehabilitation assessment.  Patient will benefit from ongoing PT and OT, can actively participate in 3 hours of therapy a day 5 days of the week, and can make measurable gains during the admission.  Patient will also benefit from the coordinated team approach during an Inpatient Acute Rehabilitation admission.  The patient will receive intensive therapy as well as Rehabilitation physician, nursing, social worker, and care management interventions.  Due to bladder management, bowel management, safety, skin/wound care, disease management, medication administration, pain management, and patient education the patient requires 24 hour a day rehabilitation nursing.  The patient is currently min assist overall with mobility and basic ADLs.  Discharge setting and therapy post discharge at home with outpatient is anticipated.  Patient has agreed to participate in the Acute Inpatient Rehabilitation Program and will admit today.   Preadmission Screen Completed By:  Clois Dupes, RN MSN 10/10/2022 11:22 AM ______________________________________________________________________   Discussed status with Dr. Shearon Stalls on 10/10/22 at 1122 and received approval for admission today.   Admission Coordinator:  Clois Dupes, RN MSN time 1122 Date 10/10/22    Assessment/Plan: Diagnosis: Does the need for close, 24 hr/day Medical supervision in concert with the patient's rehab needs make it unreasonable for  this patient to be served in a less intensive setting? Yes Co-Morbidities requiring supervision/potential complications: Cardioembolic strokes w/ Hx prostate CA, hypertension, diastolic CHF, difficulty hearing, OSA, and type 2 diabetes Due to safety, skin/wound care, disease management, medication administration, and patient education, does the patient require 24 hr/day rehab nursing? Yes Does the patient require coordinated care of a physician, rehab nurse, PT, OT to address physical and functional deficits in the context of the above medical diagnosis(es)? Yes Addressing deficits in the following areas: balance, endurance, locomotion, strength, transferring, bathing, dressing, feeding, grooming, and toileting Can the patient actively participate in an intensive therapy program of at least 3 hrs of therapy 5 days a week? Yes The potential for  patient to make measurable gains while on inpatient rehab is excellent Anticipated functional outcomes upon discharge from inpatient rehab: modified independent PT, modified independent OT,  Estimated rehab length of stay to reach the above functional goals is: 7-10 days Anticipated discharge destination: Home 10. Overall Rehab/Functional Prognosis: excellent     MD Signature:   Angelina Sheriff, DO 10/10/2022           Revision History

## 2022-10-10 NOTE — Progress Notes (Signed)
STROKE TEAM PROGRESS NOTE   SUBJECTIVE (INTERVAL HISTORY) No acute event overnight.  Neuro stable.  Loop recorder placed and discharged to CIR.   OBJECTIVE Temp:  [98 F (36.7 C)-98.6 F (37 C)] 98 F (36.7 C) (05/31 1703) Pulse Rate:  [60-66] 61 (05/31 1703) Cardiac Rhythm: Sinus bradycardia (05/31 0906) Resp:  [16-18] 18 (05/31 1703) BP: (112-142)/(55-67) 136/63 (05/31 1703) SpO2:  [95 %-96 %] 96 % (05/31 1703) Weight:  [94.7 kg] 94.7 kg (05/31 0500)  Recent Labs  Lab 10/09/22 1225 10/09/22 1628 10/09/22 2115 10/10/22 0631 10/10/22 1204  GLUCAP 101* 79 100* 96 101*   Recent Labs  Lab 10/08/22 1642 10/09/22 0625  NA 137 138  K 3.4* 3.8  CL 100 102  CO2 28 25  GLUCOSE 96 85  BUN 22 14  CREATININE 0.90 0.86  CALCIUM 9.7 9.3  MG  --  1.7  PHOS  --  3.9   Recent Labs  Lab 10/08/22 1642  AST 17  ALT 16  ALKPHOS 51  BILITOT 1.5*  PROT 7.1  ALBUMIN 3.8   Recent Labs  Lab 10/08/22 1642 10/09/22 0625  WBC 9.3 8.5  NEUTROABS 6.1  --   HGB 12.4* 12.5*  HCT 37.9* 37.7*  MCV 90.5 90.0  PLT 180 172   No results for input(s): "CKTOTAL", "CKMB", "CKMBINDEX", "TROPONINI" in the last 168 hours. No results for input(s): "LABPROT", "INR" in the last 72 hours. Recent Labs    10/08/22 1757  COLORURINE YELLOW  LABSPEC 1.009  PHURINE 6.0  GLUCOSEU NEGATIVE  HGBUR NEGATIVE  BILIRUBINUR NEGATIVE  KETONESUR NEGATIVE  PROTEINUR NEGATIVE  NITRITE NEGATIVE  LEUKOCYTESUR NEGATIVE       Component Value Date/Time   CHOL 80 10/09/2022 0625   CHOL 159 02/10/2017 1307   TRIG 85 10/09/2022 0625   HDL 18 (L) 10/09/2022 0625   HDL 67 02/10/2017 1307   CHOLHDL 4.4 10/09/2022 0625   VLDL 17 10/09/2022 0625   LDLCALC 45 10/09/2022 0625   LDLCALC 58 02/10/2017 1307   Lab Results  Component Value Date   HGBA1C 5.6 10/09/2022   No results found for: "LABOPIA", "COCAINSCRNUR", "LABBENZ", "AMPHETMU", "THCU", "LABBARB"  No results for input(s): "ETH" in the last 168  hours.  I have personally reviewed the radiological images below and agree with the radiology interpretations.  EP PPM/ICD IMPLANT  Result Date: 10/10/2022 CONCLUSIONS:  1. Successful implantation of a [employer] Reveal LINQ implantable loop recorder for cryptogenic stroke  2. No early apparent complications. Lewayne Bunting, MD 10/10/2022 2:52 PM   VAS [place] LOWER EXTREMITY VENOUS (DVT)  Result Date: 10/09/2022  Lower Venous DVT Study Patient Name:  Kenneth [name]  Date of Exam:   10/09/2022 Medical Rec #: [id number]         Accession #:    [id number] Date of Birth: 1950/07/27         Patient Gender: M Patient Age:   72 years Exam Location:  [facility] Procedure:      VAS [place] LOWER EXTREMITY VENOUS (DVT) Referring Phys: Scheryl Marten Mckade Gurka --------------------------------------------------------------------------------  Indications: Stroke.  Comparison Study: No prior studies. Performing Technologist: Jean Rosenthal RDMS, RVT  Examination Guidelines: A complete evaluation includes B-mode imaging, spectral Doppler, color Doppler, and power Doppler as needed of all accessible portions of each vessel. Bilateral testing is considered an integral part of a complete examination. Limited examinations for reoccurring indications may be performed as noted. The reflux portion of the exam is performed with the patient  in reverse Trendelenburg.  +---------+---------------+---------+-----------+----------+--------------+ RIGHT    CompressibilityPhasicitySpontaneityPropertiesThrombus Aging +---------+---------------+---------+-----------+----------+--------------+ CFV      Full           Yes      Yes                                 +---------+---------------+---------+-----------+----------+--------------+ SFJ      Full                                                        +---------+---------------+---------+-----------+----------+--------------+ FV Prox  Full                                                         +---------+---------------+---------+-----------+----------+--------------+ FV Mid   Full                                                        +---------+---------------+---------+-----------+----------+--------------+ FV DistalFull                                                        +---------+---------------+---------+-----------+----------+--------------+ PFV      Full                                                        +---------+---------------+---------+-----------+----------+--------------+ POP      Full           Yes      Yes                                 +---------+---------------+---------+-----------+----------+--------------+ PTV      Full                                                        +---------+---------------+---------+-----------+----------+--------------+ PERO     Full                                                        +---------+---------------+---------+-----------+----------+--------------+ Gastroc  Full                                                        +---------+---------------+---------+-----------+----------+--------------+   +---------+---------------+---------+-----------+----------+--------------+  LEFT     CompressibilityPhasicitySpontaneityPropertiesThrombus Aging +---------+---------------+---------+-----------+----------+--------------+ CFV      Full           Yes      Yes                                 +---------+---------------+---------+-----------+----------+--------------+ SFJ      Full                                                        +---------+---------------+---------+-----------+----------+--------------+ FV Prox  Full                                                        +---------+---------------+---------+-----------+----------+--------------+ FV Mid   Full                                                         +---------+---------------+---------+-----------+----------+--------------+ FV DistalFull                                                        +---------+---------------+---------+-----------+----------+--------------+ PFV      Full                                                        +---------+---------------+---------+-----------+----------+--------------+ POP      Full           Yes      Yes                                 +---------+---------------+---------+-----------+----------+--------------+ PTV      Full                                                        +---------+---------------+---------+-----------+----------+--------------+ PERO     Full                                                        +---------+---------------+---------+-----------+----------+--------------+ Gastroc  Full                                                        +---------+---------------+---------+-----------+----------+--------------+  Summary: RIGHT: - There is no evidence of deep vein thrombosis in the lower extremity.  - No cystic structure found in the popliteal fossa.  LEFT: - There is no evidence of deep vein thrombosis in the lower extremity.  - No cystic structure found in the popliteal fossa.  *See table(s) above for measurements and observations. Electronically signed by Heath Lark on 10/09/2022 at 5:38:22 PM.    Final    ECHOCARDIOGRAM COMPLETE BUBBLE STUDY  Result Date: 10/09/2022    ECHOCARDIOGRAM REPORT   Patient Name:   Kenneth [name] Date of Exam: 10/09/2022 Medical Rec #:  [id number]        Height:       71.0 in Accession #:    [id number]       Weight:       222.7 lb Date of Birth:  1951-03-31        BSA:          2.207 m Patient Age:    71 years         BP:           111/59 mmHg Patient Gender: M                HR:           62 bpm. Exam Location:  Inpatient Procedure: 2D Echo, Cardiac Doppler, Color Doppler and Saline Contrast Bubble            Study  Indications:    Stroke  History:        Patient has prior history of Echocardiogram examinations. CHF,                 Abnormal ECG, Stroke and Pulmonary HTN, Signs/Symptoms:Syncope;                 Risk Factors:Diabetes, Dyslipidemia, Sleep Apnea and                 Hypertension. Cancer.  Sonographer:    Sheralyn Boatman RDCS Referring Phys: [id number] CAROLE N HALL IMPRESSIONS  1. Left ventricular ejection fraction, by estimation, is 55 to 60%. The left ventricle has normal function. The left ventricle has no regional wall motion abnormalities. Left ventricular diastolic parameters are consistent with Grade I diastolic dysfunction (impaired relaxation).  2. Right ventricular systolic function is normal. The right ventricular size is normal. There is normal pulmonary artery systolic pressure. The estimated right ventricular systolic pressure is 29.4 mmHg.  3. The mitral valve is normal in structure. No evidence of mitral valve regurgitation. No evidence of mitral stenosis.  4. The aortic valve is tricuspid. There is moderate calcification of the aortic valve. There is moderate thickening of the aortic valve. Aortic valve regurgitation is not visualized. Aortic valve sclerosis/calcification is present, without any evidence of aortic stenosis.  5. The inferior vena cava is normal in size with greater than 50% respiratory variability, suggesting right atrial pressure of 3 mmHg.  6. Agitated saline contrast bubble study was negative, with no evidence of any interatrial shunt. Comparison(s): No significant change from prior study. Conclusion(s)/Recommendation(s): No intracardiac source of embolism detected on this transthoracic study. Consider a transesophageal echocardiogram to exclude cardiac source of embolism if clinically indicated. FINDINGS  Left Ventricle: Left ventricular ejection fraction, by estimation, is 55 to 60%. The left ventricle has normal function. The left ventricle has no regional wall motion abnormalities.  The left ventricular internal cavity size was normal in size. There is  no left ventricular  hypertrophy. Left ventricular diastolic parameters are consistent with Grade I diastolic dysfunction (impaired relaxation). Right Ventricle: The right ventricular size is normal. No increase in right ventricular wall thickness. Right ventricular systolic function is normal. There is normal pulmonary artery systolic pressure. The tricuspid regurgitant velocity is 2.57 m/s, and  with an assumed right atrial pressure of 3 mmHg, the estimated right ventricular systolic pressure is 29.4 mmHg. Left Atrium: Left atrial size was normal in size. Right Atrium: Right atrial size was normal in size. Pericardium: There is no evidence of pericardial effusion. Mitral Valve: The mitral valve is normal in structure. No evidence of mitral valve regurgitation. No evidence of mitral valve stenosis. Tricuspid Valve: The tricuspid valve is normal in structure. Tricuspid valve regurgitation is mild. Aortic Valve: The aortic valve is tricuspid. There is moderate calcification of the aortic valve. There is moderate thickening of the aortic valve. Aortic valve regurgitation is not visualized. Aortic valve sclerosis/calcification is present, without any  evidence of aortic stenosis. Aortic valve mean gradient measures 7.0 mmHg. Aortic valve peak gradient measures 13.8 mmHg. Aortic valve area, by VTI measures 2.81 cm. Pulmonic Valve: The pulmonic valve was normal in structure. Pulmonic valve regurgitation is trivial. Aorta: The aortic root is normal in size and structure. Venous: The inferior vena cava is normal in size with greater than 50% respiratory variability, suggesting right atrial pressure of 3 mmHg. IAS/Shunts: The atrial septum is grossly normal. Agitated saline contrast was given intravenously to evaluate for intracardiac shunting. Agitated saline contrast bubble study was negative, with no evidence of any interatrial shunt.  LEFT VENTRICLE  PLAX 2D LVIDd:         5.60 cm   Diastology LVIDs:         3.60 cm   LV e' medial:    10.90 cm/s LV PW:         0.80 cm   LV E/e' medial:  7.9 LV IVS:        1.00 cm   LV e' lateral:   14.90 cm/s LVOT diam:     2.40 cm   LV E/e' lateral: 5.8 LV SV:         114 LV SV Index:   52 LVOT Area:     4.52 cm  RIGHT VENTRICLE             IVC RV S prime:     14.30 cm/s  IVC diam: 2.00 cm TAPSE (M-mode): 1.6 cm LEFT ATRIUM             Index        RIGHT ATRIUM          Index LA diam:        3.80 cm 1.72 cm/m   RA Area:     8.98 cm LA Vol (A2C):   28.0 ml 12.69 ml/m  RA Volume:   13.00 ml 5.89 ml/m LA Vol (A4C):   23.4 ml 10.60 ml/m LA Biplane Vol: 26.1 ml 11.83 ml/m  AORTIC VALVE AV Area (Vmax):    2.78 cm AV Area (Vmean):   2.79 cm AV Area (VTI):     2.81 cm AV Vmax:           185.50 cm/s AV Vmean:          122.500 cm/s AV VTI:            0.406 m AV Peak Grad:      13.8 mmHg AV Mean Grad:  7.0 mmHg LVOT Vmax:         114.00 cm/s LVOT Vmean:        75.600 cm/s LVOT VTI:          0.252 m LVOT/AV VTI ratio: 0.62  AORTA Ao Root diam: 3.40 cm Ao Asc diam:  3.50 cm MITRAL VALVE               TRICUSPID VALVE MV Area (PHT): 2.99 cm    TR Peak grad:   26.4 mmHg MV Decel Time: 254 msec    TR Vmax:        257.00 cm/s MV E velocity: 86.50 cm/s MV A velocity: 85.20 cm/s  SHUNTS MV E/A ratio:  1.02        Systemic VTI:  0.25 m                            Systemic Diam: 2.40 cm Laurance Flatten MD Electronically signed by Laurance Flatten MD Signature Date/Time: 10/09/2022/2:56:29 PM    Final    CT ANGIO HEAD NECK W WO CM  Result Date: 10/08/2022 CLINICAL DATA:  Acute infarcts in the medial parietal lobe bilaterally. EXAM: CT ANGIOGRAPHY HEAD AND NECK WITH AND WITHOUT CONTRAST TECHNIQUE: Multidetector CT imaging of the head and neck was performed using the standard protocol during bolus administration of intravenous contrast. Multiplanar CT image reconstructions and MIPs were obtained to evaluate the vascular anatomy.  Carotid stenosis measurements (when applicable) are obtained utilizing NASCET criteria, using the distal internal carotid diameter as the denominator. RADIATION DOSE REDUCTION: This exam was performed according to the departmental dose-optimization program which includes automated exposure control, adjustment of the mA and/or kV according to patient size and/or use of iterative reconstruction technique. CONTRAST:  75mL OMNIPAQUE IOHEXOL 350 MG/ML SOLN COMPARISON:  No prior CTA available, correlation is made with 10/08/2022 MRI head and 07/23/2017 CT head FINDINGS: CT HEAD FINDINGS Brain: No CT correlate is seen for the small acute infarcts noted on the same-day MRI. No evidence of additional acute infarct, hemorrhage, mass, mass effect, or midline shift. No hydrocephalus or extra-axial fluid collection. Vascular: No hyperdense vessel. Skull: Negative for fracture or focal lesion. Sinuses/Orbits: Mucosal thickening in the ethmoid air cells. No acute finding in the orbits. Other: The mastoid air cells are well aerated. CTA NECK FINDINGS Aortic arch: Two-vessel arch with a common origin of the brachiocephalic and left common carotid arteries. Imaged portion shows no evidence of aneurysm or dissection. No significant stenosis of the major arch vessel origins. Right carotid system: No evidence of dissection, occlusion, or hemodynamically significant stenosis (greater than 50%). Left carotid system: No evidence of dissection, occlusion, or hemodynamically significant stenosis (greater than 50%). Vertebral arteries: No evidence of dissection, occlusion, or hemodynamically significant stenosis (greater than 50%). Skeleton: No acute osseous abnormality. Status post ACDF C5-C7 and posterior fusion C4-C7. Other neck: Redemonstrated 19 mm right thyroid nodule, which appears unchanged on exams dating back to 2014, favored to be benign. Patent Upper chest: No focal pulmonary opacity or pleural effusion. Review of the MIP images  confirms the above findings CTA HEAD FINDINGS Anterior circulation: Both internal carotid arteries are patent to the termini, without significant stenosis. Left A1. Aplastic right A1. Normal anterior communicating artery. Asymmetrically narrow right A3 (series 14, images 231-232), which could represent stenosis versus asymmetric branching. Anterior cerebral arteries are otherwise patent to their distal aspects without significant stenosis. No M1 stenosis or occlusion. MCA branches  perfused to their distal aspects without significant stenosis. Posterior circulation: Vertebral arteries patent to the vertebrobasilar junction without significant stenosis. Posterior inferior cerebellar arteries patent proximally. Basilar patent to its distal aspect without significant stenosis. Superior cerebellar arteries patent proximally. Patent P1 segments. PCAs perfused to their distal aspects without significant stenosis. The bilateral posterior communicating arteries are not visualized. Venous sinuses: Well opacified, patent. Anatomic variants: None significant. Review of the MIP images confirms the above findings IMPRESSION: 1. No intracranial large vessel occlusion. Asymmetrically narrow right A3, which could represent stenosis versus asymmetric branching. 2. No hemodynamically significant stenosis in the neck. Electronically Signed   By: Wiliam Ke M.D.   On: 10/08/2022 23:18   MR Brain Wo Contrast (neuro protocol)  Result Date: 10/08/2022 CLINICAL DATA:  Neuro deficit, acute, stroke suspected EXAM: MRI HEAD WITHOUT CONTRAST TECHNIQUE: Multiplanar, multiecho pulse sequences of the brain and surrounding structures were obtained without intravenous contrast. COMPARISON:  CT Head 07/23/17 FINDINGS: Brain: There are acute infarcts in the bilateral ACA territory, left-greater-than-right involving the parasagittal bilateral parietal lobes (series 9, image 30-27). No hemorrhage. No extra-axial fluid collection. No hydrocephalus.  Sequela of mild overall chronic microvascular ischemic change. No mass effect. No midline shift. Vascular: Normal flow voids. Skull and upper cervical spine: Normal marrow signal. Sinuses/Orbits: No mastoid or middle ear effusion. Paranasal sinuses are notable for mucosal thickening in the bilateral ethmoid sinuses. Orbits are unremarkable. Other: None. IMPRESSION: Acute bilateral ACA territory infarcts (left-greater-than-right) involving the parasagittal aspects of the bilateral parietal lobes. No hemorrhage. Electronically Signed   By: Lorenza Cambridge M.D.   On: 10/08/2022 18:31     PHYSICAL EXAM  Temp:  [98 F (36.7 C)-98.6 F (37 C)] 98 F (36.7 C) (05/31 1703) Pulse Rate:  [60-66] 61 (05/31 1703) Resp:  [16-18] 18 (05/31 1703) BP: (112-142)/(55-67) 136/63 (05/31 1703) SpO2:  [95 %-96 %] 96 % (05/31 1703) Weight:  [94.7 kg] 94.7 kg (05/31 0500)  General - Well nourished, well developed, in no apparent distress.  Ophthalmologic - fundi not visualized due to noncooperation.  Cardiovascular - Regular rhythm and rate.  Mental Status -  Level of arousal and orientation to time, place, and person were intact. Language including expression, naming, repetition, comprehension was assessed and found intact. Fund of Knowledge was assessed and was intact.  Cranial Nerves II - XII - II - Visual field intact OU. III, IV, VI - Extraocular movements intact. V - Facial sensation intact bilaterally. VII - Facial movement intact bilaterally. VIII - Hearing & vestibular intact bilaterally. X - Palate elevates symmetrically. XI - Chin turning & shoulder shrug intact bilaterally. XII - Tongue protrusion intact  Motor Strength - The patient's strength was normal in all extremities and pronator drift was absent.  Bulk was normal and fasciculations were absent.   Motor Tone - Muscle tone was assessed at the neck and appendages and was normal.  Reflexes - The patient's reflexes were symmetrical in all  extremities and he had no pathological reflexes.  Sensory - Light touch, temperature/pinprick were assessed and were symmetrical.    Coordination - The patient had normal movements in the hands and feet with no ataxia or dysmetria.  Tremor was absent.  Gait and Station - deferred.   ASSESSMENT/PLAN Mr. ALBION DEPIETRO is a 72 y.o. male with history of diabetes, hypertension, hyperlipidemia, OSA, prostate cancer admitted for difficulty walking. No tPA given due to outside window.    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 Patient counseled to be compliant with his antithrombotic medications Ongoing aggressive stroke risk factor management Therapy recommendations: CIR Disposition: Pending  Diabetes A1c 5.6, goal less than 7.0 Controlled SSI CBG monitoring Continue PCP follow-up  Hypertension Stable Long term BP goal normotensive  Hyperlipidemia Home meds: Lipitor 40 and Zetia 10 LDL 45, goal < 70 Now on Lipitor 40 and Zetia 10 Continue statin at discharge  Other Stroke Risk Factors Advanced age Obesity, Body mass index is 29.12 kg/m.  Obstructive sleep apnea   Other Active Problems Prostate cancer  Hospital day # 2  Neurology will sign off. Please call with questions. Pt will follow up with stroke clinic NP at [facility] in about 4 weeks. Thanks for the consult.   Marvel Plan, MD PhD Stroke Neurology 10/10/2022 6:11 PM    To contact Stroke Continuity provider, please refer to [email or web address]. After hours, contact  General Neurology

## 2022-10-10 NOTE — H&P (Signed)
Expand All Collapse All      Physical Medicine and Rehabilitation Admission H&P        Chief Complaint  Patient presents with   Functional  deficits due to stroke   Weakness      HPI: Kenneth [name] is a 72 year old RH male with history of T2DM, prostate CA (Dr. Annabell Howells), congential hearing loss, HTN; who was admitted on 10/08/22 after getting up in morning and having weakness LLE>RLE and  difficulty walking. CTA head neck negative for LVO. 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 2D echo showed EF 55-60% with no wall abnormalities, moderate thickening of aortic valve without stenosis and negative bubble study. BLE dopplers were negative for DVT.    Dr. Roda Shutters felt that stroke likely cardio embolic in nature and recommended DAPT X 3 weeks followed by Plavix alone. Loop recorder placed by Dr. Ladona Ridgel on 05/31 prior to discharge.     He was independent and working full time PTA. Patient has had functional decline and CIR recommended  by therapy team.        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   Eyes:  Negative for blurred vision and double vision.       Wears contacts  Respiratory:  Negative for cough and shortness of breath.   Cardiovascular:  Negative for chest pain and palpitations.  Gastrointestinal:  Negative for abdominal pain, heartburn and nausea.  Genitourinary:  Negative for dysuria and urgency.  Musculoskeletal:  Negative for back pain, joint pain, myalgias and neck pain.  Neurological:  Positive for weakness. Negative for dizziness, sensory change and headaches.  Psychiatric/Behavioral:  The patient is not nervous/anxious and does not have insomnia.           Past Medical History:  Diagnosis Date   Broken neck (HCC)     Cervical spinal stenosis      ACDF complicated with fracture s/p posterior fusion    Diabetes mellitus     Hearing loss of both ears      Since birth   History of colon polyps - adenomas 04/03/2010   Hyperlipidemia     Hypertension     Iron deficiency anemia, unspecified     OSA (obstructive sleep apnea)      refusing CPAP   Pneumonia 03/2022    w/effusion   Prostate cancer [facility])             Past Surgical History:  Procedure Laterality Date   ANTERIOR CERVICAL DECOMP/DISCECTOMY FUSION N/A 10/01/2012    Procedure: ANTERIOR CERVICAL DECOMPRESSION/DISCECTOMY FUSION 2 LEVELS;  Surgeon: Tia Alert, MD;  Location: [facility];  Service: Neurosurgery;  Laterality: N/A;  Cervical five-six,Cervical six-seven   COLONOSCOPY   09/30/2010, 03/01/2014    diverticulosis, small internal hemorrhoids   COLONOSCOPY W/ POLYPECTOMY   04/03/2010    4 polyps 8-56mm, worst TV adenoma with high-grade dysplasia   NO PAST SURGERIES       POLYPECTOMY       POSTERIOR CERVICAL FUSION/FORAMINOTOMY Left 10/20/2012    Procedure: C/5-6,C/6-7 Lami/Multi level,POSTERIOR CERVICAL FUSION/FORAMINOTOMY C/4-7;  Surgeon: Tia Alert, MD;  Location: [facility];  Service: Neurosurgery;  Laterality: Left;  Cervical Five-Six, Cervical Six-Seven Laminectomies, Posterior Cervical Fusion/Foraminotomies Cervical Four through Seven.    TONSILLECTOMY  Family History  Problem Relation Age of Onset   Diabetes Mother     Stomach cancer Neg Hx     Rectal cancer Neg Hx     Pancreatic cancer Neg Hx     Colon cancer Neg Hx     Colon polyps Neg Hx     Esophageal cancer Neg Hx     Prostate cancer Neg Hx     Breast cancer Neg Hx        Social History:  Married. Full time CPA --owns firm.  Does not have any children and family OOT. He reports that he quit smoking about 53 years ago. His smoking use included cigarettes. He has a 0.50 pack-year smoking history. He has never used smokeless tobacco. He reports that he does not drink alcohol and does not use drugs.          Allergies  Allergen  Reactions   [facility] due to complications   Penicillins        CHILDHOOD ALLERGY Has patient had a PCN reaction causing immediate rash, facial/tongue/throat swelling, SOB or lightheadedness with hypotension: Unknown Has patient had a PCN reaction causing severe rash involving mucus membranes or skin necrosis: Unknown Has patient had a PCN reaction that required hospitalization: Unknown Has patient had a PCN reaction occurring within the last 10 years: Unknown If all of the above answers are "NO", then may proceed with Cephalosporin use.                Medications Prior to Admission  Medication Sig Dispense Refill   acetaminophen (TYLENOL) 500 MG tablet Take 500 mg by mouth daily as needed for headache.       aspirin 81 MG tablet Take 81 mg by mouth every evening.       benazepril (LOTENSIN) 40 MG tablet Take 1 tablet (40 mg total) by mouth daily. Please schedule appt for future refills. 1st attempt 30 tablet 0   buPROPion ER (WELLBUTRIN SR) 100 MG 12 hr tablet Take 100 mg by mouth daily.       chlorthalidone (HYGROTON) 25 MG tablet Take 25 mg by mouth daily.       denosumab (PROLIA) 60 MG/ML SOSY injection Inject 60 mg into the skin every 6 (six) months.       ezetimibe (ZETIA) 10 MG tablet Take 10 mg by mouth daily.       metFORMIN (GLUCOPHAGE) 500 MG tablet Take 500 mg by mouth 2 (two) times daily with a meal.       MOUNJARO 5 MG/0.5ML Pen Inject 5 mg into the skin once a week.       tamsulosin (FLOMAX) 0.4 MG CAPS capsule Take 0.4 mg by mouth daily. Patient states he is taking it twice daily       TOUJEO SOLOSTAR 300 UNIT/ML Solostar Pen Inject 36 Units into the skin daily.       VITAMIN D PO Take 1 tablet by mouth 2 (two) times daily. Twice a day pr patient       [DISCONTINUED] atorvastatin (LIPITOR) 40 MG tablet Take 40 mg by mouth daily.       B-D UF III MINI PEN NEEDLES 31G X 5 MM MISC Inject 1 Stick as directed as directed.             Home: Home Living Family/patient expects to be discharged to:: Private residence Living Arrangements: Spouse/significant other (wife currently hospitalized in [place] since  April) Available Help at Discharge: Available PRN/intermittently, Family Type of Home: House Home Access: Stairs to enter [employer] of Steps: 2 Entrance Stairs-Rails: Can reach both, Left, Right Home Layout: Two level, Able to live on main level with bedroom/bathroom Bathroom Shower/Tub: Health visitor: Standard Bathroom Accessibility: Yes Home Equipment: BSC/3in1, Shower seat - built in, Agricultural consultant (2 wheels), IT sales professional, Medical laboratory scientific officer - single point  Lives With: Spouse   Functional History: Prior Function Prior Level of Function : Independent/Modified Independent, Driving, Working/employed Mobility Comments: no AD, typically walks every day at gym ADLs Comments: 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 walks his small dog, Bella   Functional Status:  Mobility: Bed Mobility General bed mobility comments: Pt up in recliner. Transfers Overall transfer level: Needs assistance Equipment used: Rolling walker (2 wheels) Transfers: Sit to/from Stand Sit to Stand: Min guard General transfer comment: improvements to min guard when pushing from armrests Ambulation/Gait Ambulation/Gait assistance: Min assist Gait Distance (Feet): 50 Feet Assistive device: Rolling walker (2 wheels) Gait Pattern/deviations: Step-through pattern, Narrow base of support, Decreased stride length, Drifts right/left General Gait Details: slow, guarded gait. Amb initially with RW min guard straght level surface and min assist turning, managing obstacles. Short gait trial in room without AD: pt reaching for UE support, shuffle steps, unsteady Gait velocity: decreased Gait velocity interpretation: 1.31 - 2.62 ft/sec, indicative of limited community ambulator   ADL: ADL Overall ADL's :  Needs assistance/impaired Eating/Feeding: Independent Grooming: Min guard, Standing, Oral care, Wash/dry face, Brushing hair Grooming Details (indicate cue type and reason): able to stand without UE support during tasks. mild sway but able to correct Upper Body Bathing: Set up, Sitting Lower Body Bathing: Minimal assistance, Sit to/from stand Upper Body Dressing : Set up, Sitting Lower Body Dressing: Minimal assistance, Sit to/from stand Lower Body Dressing Details (indicate cue type and reason): able to cross LE to each socks, some assist for balance in standing/donning around waist likely Toilet Transfer: Min guard, Ambulation, Rolling walker (2 wheels) Toilet Transfer Details (indicate cue type and reason): use of RW needed for stability, would need Min A for mobility without AD Toileting- Clothing Manipulation and Hygiene: Minimal assistance, Sit to/from stand, Sitting/lateral lean Functional mobility during ADLs: Min guard, Rolling walker (2 wheels) General ADL Comments: pt w/ balance and LE strength deficits requiring new DME use for mobility and increased time/effort for ADLs. Pt likely needing increased assist for IADLs and would benefit from higher level cognitive assessments   Cognition: Cognition Overall Cognitive Status: Within Functional Limits for tasks assessed Orientation Level: Oriented X4 Cognition Arousal/Alertness: Awake/alert Behavior During Therapy: WFL for tasks assessed/performed Overall Cognitive Status: Within Functional Limits for tasks assessed General Comments: midly labile but overall pleasant, WFL. shows some insight into deficits but also reports desire to go back to work on Monday     Blood pressure 112/67, pulse 66, temperature 98.6 F (37 C), temperature source Oral, resp. rate 16, height 5\' 11"  (1.803 m), weight 94.7 kg, SpO2 95 %. Physical Exam Constitutional: No apparent distress. Appropriate appearance for age.  HENT: No JVD. Neck Supple. Trachea  midline. Atraumatic, normocephalic. +Bilateral hearing aides Eyes: PERRLA. EOMI. Visual fields grossly intact.  Cardiovascular: RRR, no murmurs/rub/gallops. Trace bilateral Edema. Peripheral pulses 2+  Respiratory: CTAB. No rales, rhonchi, or wheezing. On RA.  Abdomen: + bowel sounds, normoactive. Mild distention, no tenderness.  Skin: C/D/I. No apparent lesions. MSK:      No apparent  deformity.      Strength:                RUE: 5-/5 SA, 5/5 EF, 5/5 EE, 5/5 WE, 5/5 FF, 5/5 FA                 LUE: 5-/5 SA, 5-/5 EF, 5-/5 EE, 5/5 WE, 5/5 FF, 5/5 FA                 RLE: 5/5 HF, 5/5 KE, 5/5 DF, 5/5 EHL, 5/5 PF                 LLE:  5/5 HF, 5/5 KE, 5/5 DF, 5/5 EHL, 5/5 PF    Neurologic exam:  Cognition: AAO to person, place, time and event.  Language: Fluent, No substitutions or neoglisms. Mild speech impediment baseline per patient. Names 3/3 objects correctly Memory: Recalls 3/3 objects at 5 minutes. No apparent deficits  Insight: Good insight into current condition.  Mood: Pleasant affect, appropriate mood.  Sensation: To light touch intact in BL UEs and LEs  Reflexes: 2+ in BL UE and LEs. Negative Hoffman's and babinski signs bilaterally.  CN: 2-12 grossly intact.  Coordination: Mild L>R UE ataxia; no LE ataxia. Right truncal drift with eyes closed.  Spasticity: MAS 0 in all extremities.  Gait: Not observed      Lab Results Last 48 Hours        Results for orders placed or performed during the hospital encounter of 10/08/22 (from the past 48 hour(s))  CBC with Differential     Status: Abnormal    Collection Time: 10/08/22  4:42 PM  Result Value Ref Range    WBC 9.3 4.0 - 10.5 K/uL    RBC 4.19 (L) 4.22 - 5.81 MIL/uL    Hemoglobin 12.4 (L) 13.0 - 17.0 g/dL    HCT 16.1 (L) 09.6 - 52.0 %    MCV 90.5 80.0 - 100.0 fL    MCH 29.6 26.0 - 34.0 pg    MCHC 32.7 30.0 - 36.0 g/dL    RDW 04.5 40.9 - 81.1 %    Platelets 180 150 - 400 K/uL    nRBC 0.0 0.0 - 0.2 %    Neutrophils Relative %  65 %    Neutro Abs 6.1 1.7 - 7.7 K/uL    Lymphocytes Relative 18 %    Lymphs Abs 1.7 0.7 - 4.0 K/uL    Monocytes Relative 14 %    Monocytes Absolute 1.3 (H) 0.1 - 1.0 K/uL    Eosinophils Relative 2 %    Eosinophils Absolute 0.2 0.0 - 0.5 K/uL    Basophils Relative 1 %    Basophils Absolute 0.1 0.0 - 0.1 K/uL    Immature Granulocytes 0 %    Abs Immature Granulocytes 0.03 0.00 - 0.07 K/uL      Comment: Performed at [facility], [address], [place], [place] [address]  Comprehensive metabolic panel     Status: Abnormal    Collection Time: 10/08/22  4:42 PM  Result Value Ref Range    Sodium 137 135 - 145 mmol/L    Potassium 3.4 (L) 3.5 - 5.1 mmol/L    Chloride 100 98 - 111 mmol/L    CO2 28 22 - 32 mmol/L    Glucose, Bld 96 70 - 99 mg/dL      Comment: Glucose reference range applies only to samples taken after fasting for at least 8 hours.  BUN 22 8 - 23 mg/dL    Creatinine, Ser 0.98 0.61 - 1.24 mg/dL    Calcium 9.7 8.9 - 11.9 mg/dL    Total Protein 7.1 6.5 - 8.1 g/dL    Albumin 3.8 3.5 - 5.0 g/dL    AST 17 15 - 41 U/L    ALT 16 0 - 44 U/L    Alkaline Phosphatase 51 38 - 126 U/L    Total Bilirubin 1.5 (H) 0.3 - 1.2 mg/dL    GFR, Estimated >14 >78 mL/min      Comment: (NOTE) Calculated using the CKD-EPI Creatinine Equation (2021)      Anion gap 9 5 - 15      Comment: Performed at [facility], [address], [place], [place] [address]  Urinalysis, Routine w reflex microscopic -Urine, Clean Catch     Status: None    Collection Time: 10/08/22  5:57 PM  Result Value Ref Range    Color, Urine YELLOW YELLOW    APPearance CLEAR CLEAR    Specific Gravity, Urine 1.009 1.005 - 1.030    pH 6.0 5.0 - 8.0    Glucose, UA NEGATIVE NEGATIVE mg/dL    Hgb urine dipstick NEGATIVE NEGATIVE    Bilirubin Urine NEGATIVE NEGATIVE    Ketones, ur NEGATIVE NEGATIVE mg/dL    Protein, ur NEGATIVE NEGATIVE mg/dL    Nitrite NEGATIVE NEGATIVE    Leukocytes,Ua  NEGATIVE NEGATIVE      Comment: Performed at [facility], [address] [place], [place] [address]  Lipid panel     Status: Abnormal    Collection Time: 10/09/22  6:25 AM  Result Value Ref Range    Cholesterol 80 0 - 200 mg/dL    Triglycerides 85 <578 mg/dL    HDL 18 (L) >46 mg/dL    Total CHOL/HDL Ratio 4.4 RATIO    VLDL 17 0 - 40 mg/dL    LDL Cholesterol 45 0 - 99 mg/dL      Comment:        Total Cholesterol/HDL:CHD Risk Coronary Heart Disease Risk Table                     Men   Women  1/2 Average Risk   3.4   3.3  Average Risk       5.0   4.4  2 X Average Risk   9.6   7.1  3 X Average Risk  23.4   11.0        Use the calculated Patient Ratio above and the CHD Risk Table to determine the patient's CHD Risk.        ATP III CLASSIFICATION (LDL):  <100     mg/dL   Optimal  962-952  mg/dL   Near or Above                    Optimal  130-159  mg/dL   Borderline  841-324  mg/dL   High  >401     mg/dL   Very High Performed at [facility], [address], [place], [place] [address]    Hemoglobin A1c     Status: None    Collection Time: 10/09/22  6:25 AM  Result Value Ref Range    Hgb A1c MFr Bld 5.6 4.8 - 5.6 %      Comment: (NOTE)         Prediabetes: 5.7 - 6.4  Diabetes: >6.4         Glycemic control for adults with diabetes: <7.0      Mean Plasma Glucose 114 mg/dL      Comment: (NOTE) Performed At: [facility] [address] [place], [place] [id number] Jolene Schimke MD NW:[id number]    CBC     Status: Abnormal    Collection Time: 10/09/22  6:25 AM  Result Value Ref Range    WBC 8.5 4.0 - 10.5 K/uL    RBC 4.19 (L) 4.22 - 5.81 MIL/uL    Hemoglobin 12.5 (L) 13.0 - 17.0 g/dL    HCT 57.8 (L) 46.9 - 52.0 %    MCV 90.0 80.0 - 100.0 fL    MCH 29.8 26.0 - 34.0 pg    MCHC 33.2 30.0 - 36.0 g/dL    RDW 62.9 52.8 - 41.3 %    Platelets 172 150 - 400 K/uL    nRBC 0.0 0.0 - 0.2 %      Comment: Performed at [facility], [address] [place], [place] [address]  Basic metabolic panel     Status: None    Collection Time: 10/09/22  6:25 AM  Result Value Ref Range    Sodium 138 135 - 145 mmol/L    Potassium 3.8 3.5 - 5.1 mmol/L    Chloride 102 98 - 111 mmol/L    CO2 25 22 - 32 mmol/L    Glucose, Bld 85 70 - 99 mg/dL      Comment: Glucose reference range applies only to samples taken after fasting for at least 8 hours.    BUN 14 8 - 23 mg/dL    Creatinine, Ser 0.27 0.61 - 1.24 mg/dL    Calcium 9.3 8.9 - 25.3 mg/dL    GFR, Estimated >66 >44 mL/min      Comment: (NOTE) Calculated using the CKD-EPI Creatinine Equation (2021)      Anion gap 11 5 - 15      Comment: Performed at [facility], [address] [place], [place] [address]  Magnesium     Status: None    Collection Time: 10/09/22  6:25 AM  Result Value Ref Range    Magnesium 1.7 1.7 - 2.4 mg/dL      Comment: Performed at [facility], [address] [place], [place] [address]  Phosphorus     Status: None    Collection Time: 10/09/22  6:25 AM  Result Value Ref Range    Phosphorus 3.9 2.5 - 4.6 mg/dL      Comment: Performed at [facility], [address], [place], [place] [address]  Glucose, capillary     Status: Abnormal    Collection Time: 10/09/22 12:25 PM  Result Value Ref Range    Glucose-Capillary 101 (H) 70 - 99 mg/dL      Comment: Glucose reference range applies only to samples taken after fasting for at least 8 hours.    Comment 1 Notify RN      Comment 2 Document in Chart    Glucose, capillary     Status: None    Collection Time: 10/09/22  4:28 PM  Result Value Ref Range    Glucose-Capillary 79 70 - 99 mg/dL      Comment: Glucose reference range applies only to samples taken after fasting for at least 8 hours.    Comment 1 Notify RN      Comment 2 Document in Chart  Glucose, capillary     Status: Abnormal    Collection Time: 10/09/22  9:15 PM  Result Value Ref Range    Glucose-Capillary 100 (H) 70 - 99  mg/dL      Comment: Glucose reference range applies only to samples taken after fasting for at least 8 hours.    Comment 1 Notify RN    Glucose, capillary     Status: None    Collection Time: 10/10/22  6:31 AM  Result Value Ref Range    Glucose-Capillary 96 70 - 99 mg/dL      Comment: Glucose reference range applies only to samples taken after fasting for at least 8 hours.  Glucose, capillary     Status: Abnormal    Collection Time: 10/10/22 12:04 PM  Result Value Ref Range    Glucose-Capillary 101 (H) 70 - 99 mg/dL      Comment: Glucose reference range applies only to samples taken after fasting for at least 8 hours.    Comment 1 Notify RN      Comment 2 Document in Chart         Imaging Results (Last 48 hours)  EP PPM/ICD IMPLANT   Result Date: 10/10/2022 CONCLUSIONS:  1. Successful implantation of a [employer] Reveal LINQ implantable loop recorder for cryptogenic stroke  2. No early apparent complications. Lewayne Bunting, MD 10/10/2022 2:52 PM    VAS [place] LOWER EXTREMITY VENOUS (DVT)   Result Date: 10/09/2022  Lower Venous DVT Study Patient Name:  KHAMIR APPLIN  Date of Exam:   10/09/2022 Medical Rec #: [id number]         Accession #:    [id number] Date of Birth: Mar 12, 1951         Patient Gender: M Patient Age:   58 years Exam Location:  [facility] Procedure:      VAS [place] LOWER EXTREMITY VENOUS (DVT) Referring Phys: Scheryl Marten XU --------------------------------------------------------------------------------  Indications: Stroke.  Comparison Study: No prior studies. Performing Technologist: Jean Rosenthal RDMS, RVT  Examination Guidelines: A complete evaluation includes B-mode imaging, spectral Doppler, color Doppler, and power Doppler as needed of all accessible portions of each vessel. Bilateral testing is considered an integral part of a complete examination. Limited examinations for reoccurring indications may be performed as noted. The reflux portion of the exam is performed with the  patient in reverse Trendelenburg.  +---------+---------------+---------+-----------+----------+--------------+ RIGHT    CompressibilityPhasicitySpontaneityPropertiesThrombus Aging +---------+---------------+---------+-----------+----------+--------------+ CFV      Full           Yes      Yes                                 +---------+---------------+---------+-----------+----------+--------------+ SFJ      Full                                                        +---------+---------------+---------+-----------+----------+--------------+ FV Prox  Full                                                        +---------+---------------+---------+-----------+----------+--------------+ FV Mid   Full                                                        +---------+---------------+---------+-----------+----------+--------------+  FV DistalFull                                                        +---------+---------------+---------+-----------+----------+--------------+ PFV      Full                                                        +---------+---------------+---------+-----------+----------+--------------+ POP      Full           Yes      Yes                                 +---------+---------------+---------+-----------+----------+--------------+ PTV      Full                                                        +---------+---------------+---------+-----------+----------+--------------+ PERO     Full                                                        +---------+---------------+---------+-----------+----------+--------------+ Gastroc  Full                                                        +---------+---------------+---------+-----------+----------+--------------+   +---------+---------------+---------+-----------+----------+--------------+ LEFT     CompressibilityPhasicitySpontaneityPropertiesThrombus Aging  +---------+---------------+---------+-----------+----------+--------------+ CFV      Full           Yes      Yes                                 +---------+---------------+---------+-----------+----------+--------------+ SFJ      Full                                                        +---------+---------------+---------+-----------+----------+--------------+ FV Prox  Full                                                        +---------+---------------+---------+-----------+----------+--------------+ FV Mid   Full                                                        +---------+---------------+---------+-----------+----------+--------------+  FV DistalFull                                                        +---------+---------------+---------+-----------+----------+--------------+ PFV      Full                                                        +---------+---------------+---------+-----------+----------+--------------+ POP      Full           Yes      Yes                                 +---------+---------------+---------+-----------+----------+--------------+ PTV      Full                                                        +---------+---------------+---------+-----------+----------+--------------+ PERO     Full                                                        +---------+---------------+---------+-----------+----------+--------------+ Gastroc  Full                                                        +---------+---------------+---------+-----------+----------+--------------+     Summary: RIGHT: - There is no evidence of deep vein thrombosis in the lower extremity.  - No cystic structure found in the popliteal fossa.  LEFT: - There is no evidence of deep vein thrombosis in the lower extremity.  - No cystic structure found in the popliteal fossa.  *See table(s) above for measurements and observations. Electronically signed  by Heath Lark on 10/09/2022 at 5:38:22 PM.    Final     ECHOCARDIOGRAM COMPLETE BUBBLE STUDY   Result Date: 10/09/2022    ECHOCARDIOGRAM REPORT   Patient Name:   KALLON EYER Date of Exam: 10/09/2022 Medical Rec #:  [id number]        Height:       71.0 in Accession #:    [id number]       Weight:       222.7 lb Date of Birth:  20-Aug-1950        BSA:          2.207 m Patient Age:    71 years         BP:           111/59 mmHg Patient Gender: M                HR:           62 bpm. Exam Location:  Inpatient Procedure:  2D Echo, Cardiac Doppler, Color Doppler and Saline Contrast Bubble            Study Indications:    Stroke  History:        Patient has prior history of Echocardiogram examinations. CHF,                 Abnormal ECG, Stroke and Pulmonary HTN, Signs/Symptoms:Syncope;                 Risk Factors:Diabetes, Dyslipidemia, Sleep Apnea and                 Hypertension. Cancer.  Sonographer:    Sheralyn Boatman RDCS Referring Phys: [id number] CAROLE N HALL IMPRESSIONS  1. Left ventricular ejection fraction, by estimation, is 55 to 60%. The left ventricle has normal function. The left ventricle has no regional wall motion abnormalities. Left ventricular diastolic parameters are consistent with Grade I diastolic dysfunction (impaired relaxation).  2. Right ventricular systolic function is normal. The right ventricular size is normal. There is normal pulmonary artery systolic pressure. The estimated right ventricular systolic pressure is 29.4 mmHg.  3. The mitral valve is normal in structure. No evidence of mitral valve regurgitation. No evidence of mitral stenosis.  4. The aortic valve is tricuspid. There is moderate calcification of the aortic valve. There is moderate thickening of the aortic valve. Aortic valve regurgitation is not visualized. Aortic valve sclerosis/calcification is present, without any evidence of aortic stenosis.  5. The inferior vena cava is normal in size with greater than 50% respiratory  variability, suggesting right atrial pressure of 3 mmHg.  6. Agitated saline contrast bubble study was negative, with no evidence of any interatrial shunt. Comparison(s): No significant change from prior study. Conclusion(s)/Recommendation(s): No intracardiac source of embolism detected on this transthoracic study. Consider a transesophageal echocardiogram to exclude cardiac source of embolism if clinically indicated. FINDINGS  Left Ventricle: Left ventricular ejection fraction, by estimation, is 55 to 60%. The left ventricle has normal function. The left ventricle has no regional wall motion abnormalities. The left ventricular internal cavity size was normal in size. There is  no left ventricular hypertrophy. Left ventricular diastolic parameters are consistent with Grade I diastolic dysfunction (impaired relaxation). Right Ventricle: The right ventricular size is normal. No increase in right ventricular wall thickness. Right ventricular systolic function is normal. There is normal pulmonary artery systolic pressure. The tricuspid regurgitant velocity is 2.57 m/s, and  with an assumed right atrial pressure of 3 mmHg, the estimated right ventricular systolic pressure is 29.4 mmHg. Left Atrium: Left atrial size was normal in size. Right Atrium: Right atrial size was normal in size. Pericardium: There is no evidence of pericardial effusion. Mitral Valve: The mitral valve is normal in structure. No evidence of mitral valve regurgitation. No evidence of mitral valve stenosis. Tricuspid Valve: The tricuspid valve is normal in structure. Tricuspid valve regurgitation is mild. Aortic Valve: The aortic valve is tricuspid. There is moderate calcification of the aortic valve. There is moderate thickening of the aortic valve. Aortic valve regurgitation is not visualized. Aortic valve sclerosis/calcification is present, without any  evidence of aortic stenosis. Aortic valve mean gradient measures 7.0 mmHg. Aortic valve peak  gradient measures 13.8 mmHg. Aortic valve area, by VTI measures 2.81 cm. Pulmonic Valve: The pulmonic valve was normal in structure. Pulmonic valve regurgitation is trivial. Aorta: The aortic root is normal in size and structure. Venous: The inferior vena cava is normal in size with greater than 50% respiratory  variability, suggesting right atrial pressure of 3 mmHg. IAS/Shunts: The atrial septum is grossly normal. Agitated saline contrast was given intravenously to evaluate for intracardiac shunting. Agitated saline contrast bubble study was negative, with no evidence of any interatrial shunt.  LEFT VENTRICLE PLAX 2D LVIDd:         5.60 cm   Diastology LVIDs:         3.60 cm   LV e' medial:    10.90 cm/s LV PW:         0.80 cm   LV E/e' medial:  7.9 LV IVS:        1.00 cm   LV e' lateral:   14.90 cm/s LVOT diam:     2.40 cm   LV E/e' lateral: 5.8 LV SV:         114 LV SV Index:   52 LVOT Area:     4.52 cm  RIGHT VENTRICLE             IVC RV S prime:     14.30 cm/s  IVC diam: 2.00 cm TAPSE (M-mode): 1.6 cm LEFT ATRIUM             Index        RIGHT ATRIUM          Index LA diam:        3.80 cm 1.72 cm/m   RA Area:     8.98 cm LA Vol (A2C):   28.0 ml 12.69 ml/m  RA Volume:   13.00 ml 5.89 ml/m LA Vol (A4C):   23.4 ml 10.60 ml/m LA Biplane Vol: 26.1 ml 11.83 ml/m  AORTIC VALVE AV Area (Vmax):    2.78 cm AV Area (Vmean):   2.79 cm AV Area (VTI):     2.81 cm AV Vmax:           185.50 cm/s AV Vmean:          122.500 cm/s AV VTI:            0.406 m AV Peak Grad:      13.8 mmHg AV Mean Grad:      7.0 mmHg LVOT Vmax:         114.00 cm/s LVOT Vmean:        75.600 cm/s LVOT VTI:          0.252 m LVOT/AV VTI ratio: 0.62  AORTA Ao Root diam: 3.40 cm Ao Asc diam:  3.50 cm MITRAL VALVE               TRICUSPID VALVE MV Area (PHT): 2.99 cm    TR Peak grad:   26.4 mmHg MV Decel Time: 254 msec    TR Vmax:        257.00 cm/s MV E velocity: 86.50 cm/s MV A velocity: 85.20 cm/s  SHUNTS MV E/A ratio:  1.02        Systemic  VTI:  0.25 m                            Systemic Diam: 2.40 cm Laurance Flatten MD Electronically signed by Laurance Flatten MD Signature Date/Time: 10/09/2022/2:56:29 PM    Final     CT ANGIO HEAD NECK W WO CM   Result Date: 10/08/2022 CLINICAL DATA:  Acute infarcts in the medial parietal lobe bilaterally. EXAM: CT ANGIOGRAPHY HEAD AND NECK WITH AND WITHOUT CONTRAST TECHNIQUE: Multidetector CT imaging of the head and  neck was performed using the standard protocol during bolus administration of intravenous contrast. Multiplanar CT image reconstructions and MIPs were obtained to evaluate the vascular anatomy. Carotid stenosis measurements (when applicable) are obtained utilizing NASCET criteria, using the distal internal carotid diameter as the denominator. RADIATION DOSE REDUCTION: This exam was performed according to the departmental dose-optimization program which includes automated exposure control, adjustment of the mA and/or kV according to patient size and/or use of iterative reconstruction technique. CONTRAST:  75mL OMNIPAQUE IOHEXOL 350 MG/ML SOLN COMPARISON:  No prior CTA available, correlation is made with 10/08/2022 MRI head and 07/23/2017 CT head FINDINGS: CT HEAD FINDINGS Brain: No CT correlate is seen for the small acute infarcts noted on the same-day MRI. No evidence of additional acute infarct, hemorrhage, mass, mass effect, or midline shift. No hydrocephalus or extra-axial fluid collection. Vascular: No hyperdense vessel. Skull: Negative for fracture or focal lesion. Sinuses/Orbits: Mucosal thickening in the ethmoid air cells. No acute finding in the orbits. Other: The mastoid air cells are well aerated. CTA NECK FINDINGS Aortic arch: Two-vessel arch with a common origin of the brachiocephalic and left common carotid arteries. Imaged portion shows no evidence of aneurysm or dissection. No significant stenosis of the major arch vessel origins. Right carotid system: No evidence of dissection,  occlusion, or hemodynamically significant stenosis (greater than 50%). Left carotid system: No evidence of dissection, occlusion, or hemodynamically significant stenosis (greater than 50%). Vertebral arteries: No evidence of dissection, occlusion, or hemodynamically significant stenosis (greater than 50%). Skeleton: No acute osseous abnormality. Status post ACDF C5-C7 and posterior fusion C4-C7. Other neck: Redemonstrated 19 mm right thyroid nodule, which appears unchanged on exams dating back to 2014, favored to be benign. Patent Upper chest: No focal pulmonary opacity or pleural effusion. Review of the MIP images confirms the above findings CTA HEAD FINDINGS Anterior circulation: Both internal carotid arteries are patent to the termini, without significant stenosis. Left A1. Aplastic right A1. Normal anterior communicating artery. Asymmetrically narrow right A3 (series 14, images 231-232), which could represent stenosis versus asymmetric branching. Anterior cerebral arteries are otherwise patent to their distal aspects without significant stenosis. No M1 stenosis or occlusion. MCA branches perfused to their distal aspects without significant stenosis. Posterior circulation: Vertebral arteries patent to the vertebrobasilar junction without significant stenosis. Posterior inferior cerebellar arteries patent proximally. Basilar patent to its distal aspect without significant stenosis. Superior cerebellar arteries patent proximally. Patent P1 segments. PCAs perfused to their distal aspects without significant stenosis. The bilateral posterior communicating arteries are not visualized. Venous sinuses: Well opacified, patent. Anatomic variants: None significant. Review of the MIP images confirms the above findings IMPRESSION: 1. No intracranial large vessel occlusion. Asymmetrically narrow right A3, which could represent stenosis versus asymmetric branching. 2. No hemodynamically significant stenosis in the neck.  Electronically Signed   By: Wiliam Ke M.D.   On: 10/08/2022 23:18    MR Brain Wo Contrast (neuro protocol)   Result Date: 10/08/2022 CLINICAL DATA:  Neuro deficit, acute, stroke suspected EXAM: MRI HEAD WITHOUT CONTRAST TECHNIQUE: Multiplanar, multiecho pulse sequences of the brain and surrounding structures were obtained without intravenous contrast. COMPARISON:  CT Head 07/23/17 FINDINGS: Brain: There are acute infarcts in the bilateral ACA territory, left-greater-than-right involving the parasagittal bilateral parietal lobes (series 9, image [id number]). No hemorrhage. No extra-axial fluid collection. No hydrocephalus. Sequela of mild overall chronic microvascular ischemic change. No mass effect. No midline shift. Vascular: Normal flow voids. Skull and upper cervical spine: Normal marrow signal. Sinuses/Orbits: No mastoid or middle  ear effusion. Paranasal sinuses are notable for mucosal thickening in the bilateral ethmoid sinuses. Orbits are unremarkable. Other: None. IMPRESSION: Acute bilateral ACA territory infarcts (left-greater-than-right) involving the parasagittal aspects of the bilateral parietal lobes. No hemorrhage. Electronically Signed   By: Lorenza Cambridge M.D.   On: 10/08/2022 18:31           Blood pressure 112/67, pulse 66, temperature 98.6 F (37 C), temperature source Oral, resp. rate 16, height 5\' 11"  (1.803 m), weight 94.7 kg, SpO2 95 %.   Medical Problem List and Plan: 1. 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  Antithrombotics: -DVT/anticoagulation:  Pharmaceutical: Lovenox             -antiplatelet therapy: DAPT X 3 weeks followed by Plavix alone.  3. Pain Management:  N/A. Tylenol prn available.  4. Mood/Behavior/Sleep: LCSW to follow for evaluation  and support.             --Trazodone prn for insomnia.              -antipsychotic agents: N/A 5. Neuropsych/cognition: This patient is capable of making decisions on his own behalf. 6. Skin/Wound Care: Routine pressure relief measures.  7. Fluids/Electrolytes/Nutrition: Monitor I/O. Appetite low likely due to [facility] but likes it that way.             --Check CMET on 06/03.  8. T2DM: Hgb A1C-5.6 and well controlled. Was on metformin, Monjaro and Insulin glargine. Tends to skip meals.              --Continue to hold metformin. Monjaro not on formulary.  --SSI for elevated BS--currently 79- 101 range.  9. HTN: Monitor BP TID.  Intermittent bradycardia HR in high 50's.  --Continue hold Benazepril and [place] as BP controlled off meds.  10. Dyslipidemia Hx: Will decrease Lipitor to home dose of 40 mg. LDL-45 HDL- 18 (lower despite statin).  11. OSA: Has declined CPAP 12. Prostate CA: Resume Flomax 13. Anemia/Hx of iron deficiency: Will check anemia panel.  --H/H stable in 12 range.     Jacquelynn Cree, PA-C  I have examined the patient independently and edited the note for HPI, ROS, exam, assessment, and plan as appropriate. I am in agreement with the above recommendations.   Angelina Sheriff, DO 10/10/2022

## 2022-10-10 NOTE — TOC Transition Note (Signed)
Transition of Care [facility]) - CM/SW Discharge Note   Patient Details  Name: Kenneth [name] MRN: [id number] Date of Birth: Apr 10, 1951  [facility] CM/SW Contact:  Kermit Balo, RN Phone Number: 10/10/2022, 11:59 AM   Clinical Narrative:    Pt is discharging to CIR today. CM signing off.    Final next level of care: IP Rehab Facility Barriers to Discharge: No Barriers Identified   Patient Goals and CMS Choice CMS Medicare.gov Compare Post Acute Care list provided to:: Patient Choice offered to / list presented to : Patient  Discharge Placement                         Discharge Plan and Services Additional resources added to the After Visit Summary for                                       Social Determinants of Health (SDOH) Interventions SDOH Screenings   Food Insecurity: No Food Insecurity (10/08/2022)  Housing: Low Risk  (10/08/2022)  Transportation Needs: No Transportation Needs (10/08/2022)  Utilities: Not At Risk (10/08/2022)  Tobacco Use: Medium Risk (10/08/2022)     Readmission Risk Interventions     No data to display

## 2022-10-10 NOTE — Plan of Care (Signed)
Problem: Education: Goal: Knowledge of disease or condition will improve Outcome: Progressing Goal: Knowledge of secondary prevention will improve (MUST DOCUMENT ALL) Outcome: Progressing Goal: Knowledge of patient specific risk factors will improve Kenneth [name] N/A or DELETE if not current risk factor) Outcome: Progressing   Problem: Ischemic Stroke/TIA Tissue Perfusion: Goal: Complications of ischemic stroke/TIA will be minimized Outcome: Progressing   Problem: Coping: Goal: Will verbalize positive feelings about self Outcome: Progressing Goal: Will identify appropriate support needs Outcome: Progressing   Problem: Health Behavior/Discharge Planning: Goal: Ability to manage health-related needs will improve Outcome: Progressing Goal: Goals will be collaboratively established with patient/family Outcome: Progressing   Problem: Self-Care: Goal: Ability to participate in self-care as condition permits will improve Outcome: Progressing Goal: Verbalization of feelings and concerns over difficulty with self-care will improve Outcome: Progressing Goal: Ability to communicate needs accurately will improve Outcome: Progressing   Problem: Nutrition: Goal: Risk of aspiration will decrease Outcome: Progressing Goal: Dietary intake will improve Outcome: Progressing   Problem: Education: Goal: Ability to describe self-care measures that may prevent or decrease complications (Diabetes Survival Skills Education) will improve Outcome: Progressing Goal: Individualized Educational Video(s) Outcome: Progressing   Problem: Coping: Goal: Ability to adjust to condition or change in health will improve Outcome: Progressing   Problem: Fluid Volume: Goal: Ability to maintain a balanced intake and output will improve Outcome: Progressing   Problem: Health Behavior/Discharge Planning: Goal: Ability to identify and utilize available resources and services will improve Outcome: Progressing Goal:  Ability to manage health-related needs will improve Outcome: Progressing   Problem: Metabolic: Goal: Ability to maintain appropriate glucose levels will improve Outcome: Progressing   Problem: Nutritional: Goal: Maintenance of adequate nutrition will improve Outcome: Progressing Goal: Progress toward achieving an optimal weight will improve Outcome: Progressing   Problem: Skin Integrity: Goal: Risk for impaired skin integrity will decrease Outcome: Progressing   Problem: Tissue Perfusion: Goal: Adequacy of tissue perfusion will improve Outcome: Progressing   Problem: Education: Goal: Knowledge of General Education information will improve Description: Including pain rating scale, medication(s)/side effects and non-pharmacologic comfort measures Outcome: Progressing   Problem: Health Behavior/Discharge Planning: Goal: Ability to manage health-related needs will improve Outcome: Progressing   Problem: Clinical Measurements: Goal: Ability to maintain clinical measurements within normal limits will improve Outcome: Progressing Goal: Will remain free from infection Outcome: Progressing Goal: Diagnostic test results will improve Outcome: Progressing Goal: Respiratory complications will improve Outcome: Progressing Goal: Cardiovascular complication will be avoided Outcome: Progressing   Problem: Activity: Goal: Risk for activity intolerance will decrease Outcome: Progressing   Problem: Nutrition: Goal: Adequate nutrition will be maintained Outcome: Progressing   Problem: Coping: Goal: Level of anxiety will decrease Outcome: Progressing   Problem: Elimination: Goal: Will not experience complications related to bowel motility Outcome: Progressing Goal: Will not experience complications related to urinary retention Outcome: Progressing   Problem: Pain Managment: Goal: General experience of comfort will improve Outcome: Progressing   Problem: Safety: Goal:  Ability to remain free from injury will improve Outcome: Progressing   Problem: Skin Integrity: Goal: Risk for impaired skin integrity will decrease Outcome: Progressing   Problem: Education: Goal: Knowledge of disease or condition will improve Outcome: Progressing Goal: Knowledge of secondary prevention will improve (MUST DOCUMENT ALL) Outcome: Progressing Goal: Knowledge of patient specific risk factors will improve Kenneth [name] N/A or DELETE if not current risk factor) Outcome: Progressing   Problem: Ischemic Stroke/TIA Tissue Perfusion: Goal: Complications of ischemic stroke/TIA will be minimized Outcome: Progressing   Problem: Coping: Goal: Will  verbalize positive feelings about self Outcome: Progressing Goal: Will identify appropriate support needs Outcome: Progressing   Problem: Health Behavior/Discharge Planning: Goal: Ability to manage health-related needs will improve Outcome: Progressing Goal: Goals will be collaboratively established with patient/family Outcome: Progressing   Problem: Self-Care: Goal: Ability to participate in self-care as condition permits will improve Outcome: Progressing Goal: Verbalization of feelings and concerns over difficulty with self-care will improve Outcome: Progressing Goal: Ability to communicate needs accurately will improve Outcome: Progressing   Problem: Nutrition: Goal: Risk of aspiration will decrease Outcome: Progressing Goal: Dietary intake will improve Outcome: Progressing

## 2022-10-10 NOTE — Care Management Important Message (Signed)
Important Message  Patient Details  Name: Kenneth [name] MRN: [id number] Date of Birth: 02-11-1951   Medicare Important Message Given:  Yes     Aslan Montagna 10/10/2022, 3:00 PM

## 2022-10-10 NOTE — Progress Notes (Signed)
Inpatient Rehabilitation Admissions Coordinator   I have CIR bed to admit him to today after LOOP placement. I met with patient at bedside and he is in agreement. Acute team and TOC made aware. I will make the arrangements.  Ottie Glazier, RN, MSN Rehab Admissions Coordinator [phone] 10/10/2022 11:16 AM

## 2022-10-11 DIAGNOSIS — I639 Cerebral infarction, unspecified: Secondary | ICD-10-CM

## 2022-10-11 LAB — GLUCOSE, CAPILLARY
Glucose-Capillary: 107 mg/dL — ABNORMAL HIGH (ref 70–99)
Glucose-Capillary: 108 mg/dL — ABNORMAL HIGH (ref 70–99)
Glucose-Capillary: 127 mg/dL — ABNORMAL HIGH (ref 70–99)
Glucose-Capillary: 93 mg/dL (ref 70–99)

## 2022-10-11 NOTE — Evaluation (Signed)
Occupational Therapy Assessment and Plan  Patient Details  Name: Kenneth [name] MRN: [id number] Date of Birth: [birth year]  OT Diagnosis: abnormal posture, acute pain, muscle weakness (generalized), and swelling of limb Rehab Potential: Rehab Potential (ACUTE ONLY): Good ELOS: 7-10   Today's Date: 10/11/2022 OT Individual Time: 4098-1191 OT Individual Time Calculation (min): 60 min     Hospital Problem: Principal Problem:   Stroke (cerebrum) (HCC)   Past Medical History:  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   History of colon polyps - adenomas 04/03/2010   Hyperlipidemia    Hypertension    Iron deficiency anemia, unspecified    OSA (obstructive sleep apnea)    refusing CPAP   Pneumonia 03/2022   w/effusion   Prostate cancer [facility])    Past Surgical History:  Past Surgical History:  Procedure Laterality Date   ANTERIOR CERVICAL DECOMP/DISCECTOMY FUSION N/A 10/01/2012   Procedure: ANTERIOR CERVICAL DECOMPRESSION/DISCECTOMY FUSION 2 LEVELS;  Surgeon: Tia Alert, MD;  Location: [facility];  Service: Neurosurgery;  Laterality: N/A;  Cervical five-six,Cervical six-seven   COLONOSCOPY  09/30/2010, 03/01/2014   diverticulosis, small internal hemorrhoids   COLONOSCOPY W/ POLYPECTOMY  04/03/2010   4 polyps 8-65mm, worst TV adenoma with high-grade dysplasia   NO PAST SURGERIES     POLYPECTOMY     POSTERIOR CERVICAL FUSION/FORAMINOTOMY Left 10/20/2012   Procedure: C/5-6,C/6-7 Lami/Multi level,POSTERIOR CERVICAL FUSION/FORAMINOTOMY C/4-7;  Surgeon: Tia Alert, MD;  Location: [facility];  Service: Neurosurgery;  Laterality: Left;  Cervical Five-Six, Cervical Six-Seven Laminectomies, Posterior Cervical Fusion/Foraminotomies Cervical Four through Seven.    TONSILLECTOMY      Assessment &  Plan Clinical Impression:  Pt is a 72 y.o. 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 He presented to the ED from home after a fall with c/o BLE weakness. PMH: congenital hearing loss, DM, HTN, hyperlipidemia, sleep apnea, prostate cancer   Patient currently requires min with basic self-care skills secondary to muscle weakness, decreased cardiorespiratoy endurance, impaired timing and sequencing, and decreased standing balance, decreased postural control, and decreased balance strategies.  Prior to hospitalization, patient could complete BADL/IADK with independent .  Patient will benefit from skilled intervention to increase independence with basic self-care skills and increase level of independence with iADL prior to discharge home independently.  Anticipate patient will require intermittent supervision and follow up outpatient.  OT - End of Session Activity Tolerance: Tolerates 30+ min activity with multiple rests Endurance Deficit: Yes OT Assessment Rehab Potential (ACUTE ONLY): Good OT Barriers to Discharge: Decreased caregiver support OT Patient demonstrates impairments in the following area(s): Balance;Endurance;Motor;Sensory OT Basic ADL's Functional Problem(s): Grooming;Bathing;Dressing;Toileting OT Transfers Functional Problem(s): Toilet;Tub/Shower OT Additional Impairment(s): Fuctional Use of Upper Extremity OT Plan OT Intensity: Minimum of 1-2 x/day, 45 to 90 minutes OT Frequency: 5 out of 7 days OT Duration/Estimated Length of Stay: 7-10 OT Treatment/Interventions: Balance/vestibular training;DME/adaptive equipment instruction;Patient/family education;Therapeutic Activities;Therapeutic Exercise;Psychosocial support;Functional electrical stimulation;Community reintegration;Functional mobility training;Self Care/advanced ADL retraining;UE/LE Strength taining/ROM;UE/LE Coordination activities;Skin care/wound managment;Neuromuscular re-education;Discharge planning;Disease  mangement/prevention;Pain management;Splinting/orthotics;Visual/perceptual remediation/compensation OT Self Feeding Anticipated Outcome(s): no goal OT Basic Self-Care Anticipated Outcome(s): MOD I OT Toileting Anticipated Outcome(s): MOD I OT Bathroom Transfers Anticipated Outcome(s): MOD I OT Recommendation Patient destination: Home Follow Up Recommendations: Outpatient OT;Home health OT Equipment  Recommended: To be determined   OT Evaluation Precautions/Restrictions  Precautions Precautions: Fall Restrictions Weight Bearing Restrictions: No General Chart Reviewed: Yes Family/Caregiver Present: No Vital Signs  Pain Pain Assessment Pain Score: 0-No pain Home Living/Prior Functioning Home Living Family/patient expects to be discharged to:: Private residence Living Arrangements: Spouse/significant other, Other relatives Available Help at Discharge: Available PRN/intermittently, Family Type of Home: House Home Access: Stairs to enter Secretary/administrator of Steps: 2 Entrance Stairs-Rails: Right Home Layout: Able to live on main level with bedroom/bathroom, Multi-level Bathroom Shower/Tub: Health visitor: Standard Bathroom Accessibility: Yes  Lives With: Spouse Prior Function Level of Independence: Independent with basic ADLs, Independent with transfers, Independent with homemaking with ambulation  Able to Take Stairs?: Yes Driving: Yes Vocation: Full time employment Vocation Requirements: Scientist, water quality Baseline Vision/History: 1 Wears glasses Ability to See in Adequate Light: 0 Adequate Patient Visual Report: No change from baseline Vision Assessment?: No apparent visual deficits Perception  Perception: Within Functional Limits Praxis Praxis: Intact Cognition Cognition Overall Cognitive Status: Within Functional Limits for tasks assessed Orientation Level: Place;Situation;Person Person: Oriented Place: Oriented Situation: Oriented Memory:  Appears intact Awareness: Appears intact Problem Solving: Appears intact Brief Interview for Mental Status (BIMS) Repetition of Three Words (First Attempt): 3 Temporal Orientation: Year: Correct Temporal Orientation: Month: Accurate within 5 days Temporal Orientation: Day: Correct Recall: "Sock": No, could not recall Recall: "Blue": Yes, no cue required Recall: "Bed": Yes, no cue required BIMS Summary Score: 13 Sensation Sensation Light Touch: Appears Intact Coordination Gross Motor Movements are Fluid and Coordinated: Yes Fine Motor Movements are Fluid and Coordinated: Yes Finger Nose Finger Test: WFL Motor  Motor Motor: Ataxia Motor - Skilled Clinical Observations: mild ataxia in BUE  Trunk/Postural Assessment  Cervical Assessment Cervical Assessment:  (forward head) Thoracic Assessment Thoracic Assessment:  (rounded shoulders) Lumbar Assessment Lumbar Assessment:  (posterior pelvic tilt) Postural Control Postural Control: Deficits on evaluation (delayed and inadequate righting reactions)  Balance Balance Balance Assessed: Yes Dynamic Sitting Balance Dynamic Sitting - Level of Assistance: 6: Modified independent (Device/Increase time) Static Standing Balance Static Standing - Level of Assistance: 5: Stand by assistance;4: Min assist Dynamic Standing Balance Dynamic Standing - Level of Assistance: 4: Min assist Extremity/Trunk Assessment RUE Assessment RUE Assessment: Within Functional Limits LUE Assessment LUE Assessment: Within Functional Limits  Care Tool Care Tool Self Care Eating   Eating Assist Level: Set up assist    Oral Care    Oral Care Assist Level: Contact Guard/Toucning assist    Bathing   Body parts bathed by patient: Right arm;Left arm;Chest;Abdomen;Front perineal area;Buttocks;Right upper leg;Left upper leg;Right lower leg;Left lower leg;Face     Assist Level: Contact Guard/Touching assist    Upper Body Dressing(including orthotics)    What is the patient wearing?: Pull over shirt   Assist Level: Contact Guard/Touching assist    Lower Body Dressing (excluding footwear)   What is the patient wearing?: Pants;Underwear/pull up Assist for lower body dressing: Contact Guard/Touching assist    Putting on/Taking off footwear   What is the patient wearing?: Shoes;Socks Assist for footwear: Set up assist       Care Tool Toileting Toileting activity   Assist for toileting: Contact Guard/Touching assist     Care Tool Bed Mobility Roll left and right activity   Roll left and right assist level: Supervision/Verbal cueing    Sit to lying activity   Sit to lying assist level: Supervision/Verbal cueing    Lying to sitting on side of bed activity  Lying to sitting on side of bed assist level: the ability to move from lying on the back to sitting on the side of the bed with no back support.: Supervision/Verbal cueing     Care Tool Transfers Sit to stand transfer   Sit to stand assist level: Minimal Assistance - Patient > 75%    Chair/bed transfer   Chair/bed transfer assist level: Minimal Assistance - Patient > 75%     Toilet transfer   Assist Level: Minimal Assistance - Patient > 75%     Care Tool Cognition  Expression of Ideas and Wants Expression of Ideas and Wants: 4. Without difficulty (complex and basic) - expresses complex messages without difficulty and with speech that is clear and easy to understand  Understanding Verbal and Non-Verbal Content Understanding Verbal and Non-Verbal Content: 4. Understands (complex and basic) - clear comprehension without cues or repetitions   Memory/Recall Ability     Refer to Care Plan for Long Term Goals  SHORT TERM GOAL WEEK 1 OT Short Term Goal 1 (Week 1): STG=LTG d/t ELOS  Recommendations for other services: Therapeutic Recreation  Pet therapy, Stress management, and Outing/community reintegration   Skilled Therapeutic Intervention ADL ADL Eating:  Independent Grooming: Contact guard Where Assessed-Grooming: Standing at sink Upper Body Bathing: Contact guard Where Assessed-Upper Body Bathing: Shower Lower Body Bathing: Contact guard Where Assessed-Lower Body Bathing: Shower Upper Body Dressing: Contact guard Where Assessed-Upper Body Dressing: Standing at sink Lower Body Dressing: Contact guard Where Assessed-Lower Body Dressing: Sitting at sink Toileting: Contact guard Where Assessed-Toileting: IT consultant Method: Ambulating Mobility  Transfers Sit to Stand: Contact Guard/Touching assist Stand to Sit: Contact Guard/Touching assist  1:1. Pt educated on OT role/purpose, CIR, ELOS, and CVA recovery. Pt competes BADL at shower level at sit to stand level as stated above. T demo good safety awareness electing to sit for LB ADLs. Pt with increased postural sway with eyes closed demo decreased proprioception. Pt very motivated to improve and does well with subtle cuing for any adaptations. Exited session with pt seated in w/c, exit alarm on and call light in reach.    Discharge Criteria: Patient will be discharged from OT if patient refuses treatment 3 consecutive times without medical reason, if treatment goals not met, if there is a change in medical status, if patient makes no progress towards goals or if patient is discharged from hospital.  The above assessment, treatment plan, treatment alternatives and goals were discussed and mutually agreed upon: by patient  Shon Hale 10/11/2022, 11:08 AM

## 2022-10-11 NOTE — Progress Notes (Signed)
PROGRESS NOTE   Subjective/Complaints:  Pt doing well, slept ok (states not a great sleeper at baseline). Denies wanting anything for sleep. Denies pain. LBM just now. Urinating well. Denies any other complaints or concerns today.   ROS: as per HPI. Denies CP, SOB, Abd pain/n/v/d/c   Objective:   EP PPM/ICD IMPLANT  Result Date: 10/10/2022 CONCLUSIONS:  1. Successful implantation of a [employer] Reveal LINQ implantable loop recorder for cryptogenic stroke  2. No early apparent complications. Lewayne Bunting, MD 10/10/2022 2:52 PM   VAS [place] LOWER EXTREMITY VENOUS (DVT)  Result Date: 10/09/2022  Lower Venous DVT Study Patient Name:  Kenneth [name]  Date of Exam:   10/09/2022 Medical Rec #: [id number]         Accession #:    [id number] Date of Birth: [birth year]         Patient Gender: M Patient Age:   72 years Exam Location:  [facility] Procedure:      VAS [place] LOWER EXTREMITY VENOUS (DVT) Referring Phys: Scheryl Marten XU --------------------------------------------------------------------------------  Indications: Stroke.  Comparison Study: No prior studies. Performing Technologist: Jean Rosenthal RDMS, RVT  Examination Guidelines: A complete evaluation includes B-mode imaging, spectral Doppler, color Doppler, and power Doppler as needed of all accessible portions of each vessel. Bilateral testing is considered an integral part of a complete examination. Limited examinations for reoccurring indications may be performed as noted. The reflux portion of the exam is performed with the patient in reverse Trendelenburg.  +---------+---------------+---------+-----------+----------+--------------+ RIGHT    CompressibilityPhasicitySpontaneityPropertiesThrombus Aging +---------+---------------+---------+-----------+----------+--------------+ CFV      Full           Yes      Yes                                  +---------+---------------+---------+-----------+----------+--------------+ SFJ      Full                                                        +---------+---------------+---------+-----------+----------+--------------+ FV Prox  Full                                                        +---------+---------------+---------+-----------+----------+--------------+ FV Mid   Full                                                        +---------+---------------+---------+-----------+----------+--------------+ FV DistalFull                                                        +---------+---------------+---------+-----------+----------+--------------+  PFV      Full                                                        +---------+---------------+---------+-----------+----------+--------------+ POP      Full           Yes      Yes                                 +---------+---------------+---------+-----------+----------+--------------+ PTV      Full                                                        +---------+---------------+---------+-----------+----------+--------------+ PERO     Full                                                        +---------+---------------+---------+-----------+----------+--------------+ Gastroc  Full                                                        +---------+---------------+---------+-----------+----------+--------------+   +---------+---------------+---------+-----------+----------+--------------+ LEFT     CompressibilityPhasicitySpontaneityPropertiesThrombus Aging +---------+---------------+---------+-----------+----------+--------------+ CFV      Full           Yes      Yes                                 +---------+---------------+---------+-----------+----------+--------------+ SFJ      Full                                                         +---------+---------------+---------+-----------+----------+--------------+ FV Prox  Full                                                        +---------+---------------+---------+-----------+----------+--------------+ FV Mid   Full                                                        +---------+---------------+---------+-----------+----------+--------------+ FV DistalFull                                                        +---------+---------------+---------+-----------+----------+--------------+  PFV      Full                                                        +---------+---------------+---------+-----------+----------+--------------+ POP      Full           Yes      Yes                                 +---------+---------------+---------+-----------+----------+--------------+ PTV      Full                                                        +---------+---------------+---------+-----------+----------+--------------+ PERO     Full                                                        +---------+---------------+---------+-----------+----------+--------------+ Gastroc  Full                                                        +---------+---------------+---------+-----------+----------+--------------+     Summary: RIGHT: - There is no evidence of deep vein thrombosis in the lower extremity.  - No cystic structure found in the popliteal fossa.  LEFT: - There is no evidence of deep vein thrombosis in the lower extremity.  - No cystic structure found in the popliteal fossa.  *See table(s) above for measurements and observations. Electronically signed by Heath Lark on 10/09/2022 at 5:38:22 PM.    Final    ECHOCARDIOGRAM COMPLETE BUBBLE STUDY  Result Date: 10/09/2022    ECHOCARDIOGRAM REPORT   Patient Name:   Kenneth [name] Date of Exam: 10/09/2022 Medical Rec #:  [id number]        Height:       71.0 in Accession #:    [id number]       Weight:        222.7 lb Date of Birth:  12-31-1950        BSA:          2.207 m Patient Age:    72 years         BP:           111/59 mmHg Patient Gender: M                HR:           62 bpm. Exam Location:  Inpatient Procedure: 2D Echo, Cardiac Doppler, Color Doppler and Saline Contrast Bubble            Study Indications:    Stroke  History:        Patient has prior history of Echocardiogram examinations. CHF,  Abnormal ECG, Stroke and Pulmonary HTN, Signs/Symptoms:Syncope;                 Risk Factors:Diabetes, Dyslipidemia, Sleep Apnea and                 Hypertension. Cancer.  Sonographer:    Sheralyn Boatman RDCS Referring Phys: [id number] CAROLE N HALL IMPRESSIONS  1. Left ventricular ejection fraction, by estimation, is 55 to 60%. The left ventricle has normal function. The left ventricle has no regional wall motion abnormalities. Left ventricular diastolic parameters are consistent with Grade I diastolic dysfunction (impaired relaxation).  2. Right ventricular systolic function is normal. The right ventricular size is normal. There is normal pulmonary artery systolic pressure. The estimated right ventricular systolic pressure is 29.4 mmHg.  3. The mitral valve is normal in structure. No evidence of mitral valve regurgitation. No evidence of mitral stenosis.  4. The aortic valve is tricuspid. There is moderate calcification of the aortic valve. There is moderate thickening of the aortic valve. Aortic valve regurgitation is not visualized. Aortic valve sclerosis/calcification is present, without any evidence of aortic stenosis.  5. The inferior vena cava is normal in size with greater than 50% respiratory variability, suggesting right atrial pressure of 3 mmHg.  6. Agitated saline contrast bubble study was negative, with no evidence of any interatrial shunt. Comparison(s): No significant change from prior study. Conclusion(s)/Recommendation(s): No intracardiac source of embolism detected on this transthoracic  study. Consider a transesophageal echocardiogram to exclude cardiac source of embolism if clinically indicated. FINDINGS  Left Ventricle: Left ventricular ejection fraction, by estimation, is 55 to 60%. The left ventricle has normal function. The left ventricle has no regional wall motion abnormalities. The left ventricular internal cavity size was normal in size. There is  no left ventricular hypertrophy. Left ventricular diastolic parameters are consistent with Grade I diastolic dysfunction (impaired relaxation). Right Ventricle: The right ventricular size is normal. No increase in right ventricular wall thickness. Right ventricular systolic function is normal. There is normal pulmonary artery systolic pressure. The tricuspid regurgitant velocity is 2.57 m/s, and  with an assumed right atrial pressure of 3 mmHg, the estimated right ventricular systolic pressure is 29.4 mmHg. Left Atrium: Left atrial size was normal in size. Right Atrium: Right atrial size was normal in size. Pericardium: There is no evidence of pericardial effusion. Mitral Valve: The mitral valve is normal in structure. No evidence of mitral valve regurgitation. No evidence of mitral valve stenosis. Tricuspid Valve: The tricuspid valve is normal in structure. Tricuspid valve regurgitation is mild. Aortic Valve: The aortic valve is tricuspid. There is moderate calcification of the aortic valve. There is moderate thickening of the aortic valve. Aortic valve regurgitation is not visualized. Aortic valve sclerosis/calcification is present, without any  evidence of aortic stenosis. Aortic valve mean gradient measures 7.0 mmHg. Aortic valve peak gradient measures 13.8 mmHg. Aortic valve area, by VTI measures 2.81 cm. Pulmonic Valve: The pulmonic valve was normal in structure. Pulmonic valve regurgitation is trivial. Aorta: The aortic root is normal in size and structure. Venous: The inferior vena cava is normal in size with greater than 50% respiratory  variability, suggesting right atrial pressure of 3 mmHg. IAS/Shunts: The atrial septum is grossly normal. Agitated saline contrast was given intravenously to evaluate for intracardiac shunting. Agitated saline contrast bubble study was negative, with no evidence of any interatrial shunt.  LEFT VENTRICLE PLAX 2D LVIDd:         5.60 cm   Diastology LVIDs:  3.60 cm   LV e' medial:    10.90 cm/s LV PW:         0.80 cm   LV E/e' medial:  7.9 LV IVS:        1.00 cm   LV e' lateral:   14.90 cm/s LVOT diam:     2.40 cm   LV E/e' lateral: 5.8 LV SV:         114 LV SV Index:   52 LVOT Area:     4.52 cm  RIGHT VENTRICLE             IVC RV S prime:     14.30 cm/s  IVC diam: 2.00 cm TAPSE (M-mode): 1.6 cm LEFT ATRIUM             Index        RIGHT ATRIUM          Index LA diam:        3.80 cm 1.72 cm/m   RA Area:     8.98 cm LA Vol (A2C):   28.0 ml 12.69 ml/m  RA Volume:   13.00 ml 5.89 ml/m LA Vol (A4C):   23.4 ml 10.60 ml/m LA Biplane Vol: 26.1 ml 11.83 ml/m  AORTIC VALVE AV Area (Vmax):    2.78 cm AV Area (Vmean):   2.79 cm AV Area (VTI):     2.81 cm AV Vmax:           185.50 cm/s AV Vmean:          122.500 cm/s AV VTI:            0.406 m AV Peak Grad:      13.8 mmHg AV Mean Grad:      7.0 mmHg LVOT Vmax:         114.00 cm/s LVOT Vmean:        75.600 cm/s LVOT VTI:          0.252 m LVOT/AV VTI ratio: 0.62  AORTA Ao Root diam: 3.40 cm Ao Asc diam:  3.50 cm MITRAL VALVE               TRICUSPID VALVE MV Area (PHT): 2.99 cm    TR Peak grad:   26.4 mmHg MV Decel Time: 254 msec    TR Vmax:        257.00 cm/s MV E velocity: 86.50 cm/s MV A velocity: 85.20 cm/s  SHUNTS MV E/A ratio:  1.02        Systemic VTI:  0.25 m                            Systemic Diam: 2.40 cm Laurance Flatten MD Electronically signed by Laurance Flatten MD Signature Date/Time: 10/09/2022/2:56:29 PM    Final    Recent Labs    10/08/22 1642 10/09/22 0625  WBC 9.3 8.5  HGB 12.4* 12.5*  HCT 37.9* 37.7*  PLT 180 172   Recent Labs     10/08/22 1642 10/09/22 0625  NA 137 138  K 3.4* 3.8  CL 100 102  CO2 28 25  GLUCOSE 96 85  BUN 22 14  CREATININE 0.90 0.86  CALCIUM 9.7 9.3    Intake/Output Summary (Last 24 hours) at 10/11/2022 1217 Last data filed at 10/11/2022 0833 Gross per 24 hour  Intake 358 ml  Output 850 ml  Net -492 ml        Physical Exam:  Vital Signs Blood pressure 129/62, pulse (!) 58, temperature 97.9 F (36.6 C), resp. rate 18, height 5\' 10"  (1.778 m), weight 98.1 kg, SpO2 94 %.  Constitutional: No apparent distress. Appropriate appearance for age. Sitting in w/c HENT: No JVD. Neck Supple. Trachea midline. Atraumatic, normocephalic. +Bilateral hearing aides, HOH Eyes: PERRLA. EOMI. Visual fields grossly intact.  Cardiovascular: RRR, no murmurs/rub/gallops. Trace bilateral Edema. Peripheral pulses 2+  Respiratory: CTAB. No rales, rhonchi, or wheezing. On RA.  Abdomen: + bowel sounds, normoactive. NTND  Skin: C/D/I. No apparent lesions.  PRIOR EXAMS: MSK:      No apparent deformity.      Strength:                RUE: 5-/5 SA, 5/5 EF, 5/5 EE, 5/5 WE, 5/5 FF, 5/5 FA                 LUE: 5-/5 SA, 5-/5 EF, 5-/5 EE, 5/5 WE, 5/5 FF, 5/5 FA                 RLE: 5/5 HF, 5/5 KE, 5/5 DF, 5/5 EHL, 5/5 PF                 LLE:  5/5 HF, 5/5 KE, 5/5 DF, 5/5 EHL, 5/5 PF    Neurologic exam:  Cognition: AAO to person, place, time and event.  Language: Fluent, No substitutions or neoglisms. Mild speech impediment baseline per patient. Names 3/3 objects correctly Memory: Recalls 3/3 objects at 5 minutes. No apparent deficits  Insight: Good insight into current condition.  Mood: Pleasant affect, appropriate mood.  Sensation: To light touch intact in BL UEs and LEs  Reflexes: 2+ in BL UE and LEs. Negative Hoffman's and babinski signs bilaterally.  CN: 2-12 grossly intact.  Coordination: Mild L>R UE ataxia; no LE ataxia. Right truncal drift with eyes closed.  Spasticity: MAS 0 in all extremities.  Gait: Not  observed  Assessment/Plan: 1. Functional deficits which require 3+ hours per day of interdisciplinary therapy in a comprehensive inpatient rehab setting. Physiatrist is providing close team supervision and 24 hour management of active medical problems listed below. Physiatrist and rehab team continue to assess barriers to discharge/monitor patient progress toward functional and medical goals  Care Tool:  Bathing    Body parts bathed by patient: Right arm, Left arm, Chest, Abdomen, Front perineal area, Buttocks, Right upper leg, Left upper leg, Right lower leg, Left lower leg, Face         Bathing assist Assist Level: Contact Guard/Touching assist     Upper Body Dressing/Undressing Upper body dressing   What is the patient wearing?: Pull over shirt    Upper body assist Assist Level: Contact Guard/Touching assist    Lower Body Dressing/Undressing Lower body dressing      What is the patient wearing?: Pants, Underwear/pull up     Lower body assist Assist for lower body dressing: Contact Guard/Touching assist     Toileting Toileting    Toileting assist Assist for toileting: Contact Guard/Touching assist     Transfers Chair/bed transfer  Transfers assist     Chair/bed transfer assist level: Minimal Assistance - Patient > 75%     Locomotion Ambulation   Ambulation assist      Assist level: Minimal Assistance - Patient > 75% Assistive device: No Device Max distance: 200'   Walk 10 feet activity   Assist     Assist level: Minimal Assistance - Patient > 75% Assistive device:  No Device   Walk 50 feet activity   Assist    Assist level: Minimal Assistance - Patient > 75% Assistive device: No Device    Walk 150 feet activity   Assist    Assist level: Minimal Assistance - Patient > 75% Assistive device: No Device    Walk 10 feet on uneven surface  activity   Assist     Assist level: Minimal Assistance - Patient > 75%      Wheelchair     Assist Is the patient using a wheelchair?: Yes Type of Wheelchair: Manual    Wheelchair assist level: Dependent - Patient 0% Max wheelchair distance: 150'    Wheelchair 50 feet with 2 turns activity    Assist        Assist Level: Dependent - Patient 0%   Wheelchair 150 feet activity     Assist      Assist Level: Dependent - Patient 0%   Blood pressure 129/62, pulse (!) 58, temperature 97.9 F (36.6 C), resp. rate 18, height 5\' 10"  (1.778 m), weight 98.1 kg, SpO2 94 %.  Medical Problem List and Plan: 1. 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  Antithrombotics: -DVT/anticoagulation:  Pharmaceutical: Lovenox 40mg  QD             -antiplatelet therapy: DAPT X 3 weeks followed by Plavix alone.  3. Pain Management:  N/A. Tylenol prn available.  4. Mood/Behavior/Sleep: LCSW to follow for evaluation and support.             --Trazodone 25-50mg  PRN and melatonin 3mg  prn for insomnia.              -antipsychotic agents: N/A 5. Neuropsych/cognition: This patient is capable of making decisions on his own behalf. 6. Skin/Wound Care: Routine pressure relief measures.  7. Fluids/Electrolytes/Nutrition: Monitor I/O. Appetite low likely due to [facility] but likes it that way.             --Check CMET on 06/03.  8. T2DM: Hgb A1C-5.6 and well controlled. Was on metformin, Monjaro and Insulin glargine. Tends to skip meals.              --Continue to hold metformin. Monjaro not on formulary. Semglee 20U QD --SSI for elevated BS--currently 79- 101 range.  -10/11/22 great control, cont regimen CBG (last 3)  Recent Labs    10/10/22 2107 10/11/22 0625 10/11/22 1142  GLUCAP 129* 107* 108*    9. HTN: Monitor BP TID.  Intermittent bradycardia  HR in high 50's.  --Continue hold Benazepril and [place] as BP controlled off meds.  -10/11/22 BPs controlled, monitor Vitals:   10/10/22 1703 10/10/22 2040 10/11/22 0642  BP: 136/63 (!) 134/58 129/62    10. Dyslipidemia Hx: Will decrease Lipitor to home dose of 40 mg. LDL-45 HDL- 18 (lower despite statin). Continue zetia 10mg  QD 11. OSA: Has declined CPAP 12. Prostate CA: Resume Flomax 0.4mg  QD 13. Anemia/Hx of iron deficiency: Will check anemia panel Monday 10/13/22.  --H/H stable in 12 range.    LOS: 1 days A FACE TO FACE EVALUATION WAS PERFORMED  [address] 10/11/2022, 12:17 PM

## 2022-10-11 NOTE — Evaluation (Signed)
Physical Therapy Assessment and Plan  Patient Details  Name: Kenneth [name] MRN: [id number] Date of Birth: Oct 09, 1950  PT Diagnosis: Ataxia, Difficulty walking, and Muscle weakness Rehab Potential: Good ELOS: 7 days   Today's Date: 10/11/2022 PT Individual Time: 0800-0915 and 1302-1359 PT Individual Time Calculation (min): 75 min and 57 min  Hospital Problem: Principal Problem:   Stroke (cerebrum) [facility])   Past Medical History:  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   History of colon polyps - adenomas 04/03/2010   Hyperlipidemia    Hypertension    Iron deficiency anemia, unspecified    OSA (obstructive sleep apnea)    refusing CPAP   Pneumonia 03/2022   w/effusion   Prostate cancer [facility])    Past Surgical History:  Past Surgical History:  Procedure Laterality Date   ANTERIOR CERVICAL DECOMP/DISCECTOMY FUSION N/A 10/01/2012   Procedure: ANTERIOR CERVICAL DECOMPRESSION/DISCECTOMY FUSION 2 LEVELS;  Surgeon: Tia Alert, MD;  Location: [facility];  Service: Neurosurgery;  Laterality: N/A;  Cervical five-six,Cervical six-seven   COLONOSCOPY  09/30/2010, 03/01/2014   diverticulosis, small internal hemorrhoids   COLONOSCOPY W/ POLYPECTOMY  04/03/2010   4 polyps 8-49mm, worst TV adenoma with high-grade dysplasia   NO PAST SURGERIES     POLYPECTOMY     POSTERIOR CERVICAL FUSION/FORAMINOTOMY Left 10/20/2012   Procedure: C/5-6,C/6-7 Lami/Multi level,POSTERIOR CERVICAL FUSION/FORAMINOTOMY C/4-7;  Surgeon: Tia Alert, MD;  Location: [facility];  Service: Neurosurgery;  Laterality: Left;  Cervical Five-Six, Cervical Six-Seven Laminectomies, Posterior Cervical Fusion/Foraminotomies Cervical Four through Seven.    TONSILLECTOMY      Assessment & Plan Clinical Impression: Patient is a 72 year  old RH male with history of T2DM, prostate CA (Dr. Annabell Howells), congential hearing loss, HTN; who was admitted on 10/08/22 after getting up in morning and having weakness LLE>RLE and  difficulty walking. CTA head neck negative for LVO. 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 2D echo showed EF 55-60% with no wall abnormalities, moderate thickening of aortic valve without stenosis and negative bubble study. BLE dopplers were negative for DVT.    Dr. Roda Shutters felt that stroke likely cardio embolic in nature and recommended DAPT X 3 weeks followed by Plavix alone. Loop recorder placed by Dr. Ladona Ridgel on 05/31 prior to discharge. Patient transferred to CIR on 10/10/2022 .   Patient currently requires min with mobility secondary to muscle weakness, decreased cardiorespiratoy endurance, ataxia, decreased coordination, and decreased motor planning, and decreased standing balance, decreased postural control, hemiplegia, and decreased balance strategies.  Prior to hospitalization, patient was independent  with mobility and lived with Spouse in a House home.  Home access is 2Stairs to enter.  Patient will benefit from skilled PT intervention to maximize safe functional mobility, minimize fall risk, and decrease caregiver burden for planned discharge home with intermittent assist.  Anticipate patient will benefit from follow up OP at discharge.  PT - End of Session Endurance Deficit: Yes   PT Evaluation Precautions/Restrictions Precautions Precautions: Fall Restrictions Weight Bearing Restrictions: No General Chart Reviewed: Yes Family/Caregiver Present: No  Pain Interference Pain Interference Pain Effect on Sleep: 1. Rarely or not at all Pain Interference with Therapy Activities: 1. Rarely or not at  all Pain Interference with Day-to-Day Activities: 1. Rarely or not at all Home Living/Prior Functioning Home Living Available  Help at Discharge: Available PRN/intermittently;Family Type of Home: House Home Access: Stairs to enter [employer] of Steps: 2 Entrance Stairs-Rails: Right Home Layout: Able to live on main level with bedroom/bathroom;Multi-level Bathroom Shower/Tub: Health visitor: Standard Bathroom Accessibility: Yes  Lives With: Spouse Prior Function Level of Independence: Independent with basic ADLs;Independent with transfers;Independent with homemaking with ambulation  Able to Take Stairs?: Yes Driving: Yes Vocation: Full time employment Vocation Requirements: IT trainer Vision/Perception  Vision - History Ability to See in Adequate Light: 0 Adequate Perception Perception: Within Functional Limits Praxis Praxis: Intact  Cognition Overall Cognitive Status: Within Functional Limits for tasks assessed Memory: Appears intact Awareness: Appears intact Problem Solving: Appears intact Sensation Sensation Light Touch: Appears Intact Coordination Gross Motor Movements are Fluid and Coordinated: Yes Fine Motor Movements are Fluid and Coordinated: Yes Finger Nose Finger Test: WFL Motor  Motor Motor: Ataxia Motor - Skilled Clinical Observations: mild ataxia in BUE  Trunk Postural Assessment  Cervical Assessment Cervical Assessment:  (forward head) Thoracic Assessment Thoracic Assessment:  (rounded shoulders) Lumbar Assessment Lumbar Assessment:  (posterior pelvic tilt) Postural Control Postural Control: Deficits on evaluation (delayed and inadequate righting reactions)  Balance Balance Balance Assessed: Yes Standardized Balance Assessment Standardized Balance Assessment: Berg Balance Test Berg Balance Test Sit to Stand: Able to stand  independently using hands Standing Unsupported: Able to stand 2 minutes with supervision Sitting with Back Unsupported but Feet Supported on Floor or Stool: Able to sit safely and securely 2 minutes Stand to Sit: Uses backs of legs  against chair to control descent Transfers: Able to transfer with verbal cueing and /or supervision Standing Unsupported with Eyes Closed: Able to stand 10 seconds with supervision Standing Ubsupported with Feet Together: Able to place feet together independently and stand for 1 minute with supervision From Standing, Reach Forward with Outstretched Arm: Reaches forward but needs supervision From Standing Position, Pick up Object from Floor: Unable to pick up and needs supervision From Standing Position, Turn to Look Behind Over each Shoulder: Turn sideways only but maintains balance (has limited cervical rotation at baseline) Turn 360 Degrees: Able to turn 360 degrees safely but slowly Standing Unsupported, Alternately Place Feet on Step/Stool: Able to complete >2 steps/needs minimal assist Standing Unsupported, One Foot in Front: Loses balance while stepping or standing Standing on One Leg: Tries to lift leg/unable to hold 3 seconds but remains standing independently Total Score: 28 Static Sitting Balance Static Sitting - Balance Support: Feet supported Static Sitting - Level of Assistance: 6: Modified independent (Device/Increase time) Dynamic Sitting Balance Dynamic Sitting - Balance Support: Feet supported Dynamic Sitting - Level of Assistance: 6: Modified independent (Device/Increase time) Static Standing Balance Static Standing - Level of Assistance: 5: Stand by assistance;4: Min assist Dynamic Standing Balance Dynamic Standing - Level of Assistance: 4: Min assist Extremity Assessment  RLE Assessment RLE Assessment: Exceptions to [facility] General Strength Comments: Grossly 4/5 LLE Assessment LLE Assessment: Exceptions to [facility] General Strength Comments: Grossly 4+/5  Care Tool Care Tool Bed Mobility Roll left and right activity   Roll left and right assist level: Supervision/Verbal cueing    Sit to lying activity   Sit to lying assist level: Supervision/Verbal cueing    Lying to  sitting on side of bed activity   Lying to sitting on side of bed assist level: the ability to move from lying on the back to sitting  on the side of the bed with no back support.: Supervision/Verbal cueing     Care Tool Transfers Sit to stand transfer   Sit to stand assist level: Minimal Assistance - Patient > 75%    Chair/bed transfer   Chair/bed transfer assist level: Minimal Assistance - Patient > 75%     Toilet transfer   Assist Level: Minimal Assistance - Patient > 75%    Car transfer   Car transfer assist level: Minimal Assistance - Patient > 75%      Care Tool Locomotion Ambulation   Assist level: Minimal Assistance - Patient > 75% Assistive device: No Device Max distance: 200'  Walk 10 feet activity   Assist level: Minimal Assistance - Patient > 75% Assistive device: No Device   Walk 50 feet with 2 turns activity   Assist level: Minimal Assistance - Patient > 75% Assistive device: No Device  Walk 150 feet activity   Assist level: Minimal Assistance - Patient > 75% Assistive device: No Device  Walk 10 feet on uneven surfaces activity   Assist level: Minimal Assistance - Patient > 75%    Stairs   Assist level: Minimal Assistance - Patient > 75% Stairs assistive device: 2 hand rails    Walk up/down 1 step activity   Walk up/down 1 step (curb) assist level: Minimal Assistance - Patient > 75% Walk up/down 1 step or curb assistive device: 2 hand rails  Walk up/down 4 steps activity   Walk up/down 4 steps assist level: Minimal Assistance - Patient > 75% Walk up/down 4 steps assistive device: 2 hand rails  Walk up/down 12 steps activity   Walk up/down 12 steps assist level: Minimal Assistance - Patient > 75% Walk up/down 12 steps assistive device: 2 hand rails  Pick up small objects from floor   Pick up small object from the floor assist level: Minimal Assistance - Patient > 75%    Wheelchair Is the patient using a wheelchair?: Yes Type of Wheelchair: Manual    Wheelchair assist level: Dependent - Patient 0% Max wheelchair distance: 150'  Wheel 50 feet with 2 turns activity   Assist Level: Dependent - Patient 0%  Wheel 150 feet activity   Assist Level: Dependent - Patient 0%    Refer to Care Plan for Long Term Goals  SHORT TERM GOAL WEEK 1 PT Short Term Goal 1 (Week 1): STGs = LTGs  Recommendations for other services: None   Skilled Therapeutic Intervention  Evaluation completed (see details above and below) with education on PT POC and goals and individual treatment initiated with focus on bed mobility, balance, transfers, ambulation, and stair training. Pt receive semi reclined in bed and agrees to therapy. No complaint of pain. Supine to sit with cues for body mechanics and sequencing. Pt performs sit to stand and stand pivot transfer to [facility] with minA and cues for sequencing and positioning. WC transport to gym for time management. Pt completes car transfer and ramp navigation with minA and cues for sequencing, posture, and safety. Seated rest break. Following, pt ambulates x200' without AD, with minA for stability, as well as cues for upright posture, increasing stride length to decrease risk for falls, pursed lip breathing to optimize oxygen sats, and safe sequencing for transition back to sitting in WC. Seated rest break. Pt then completes x12 6" steps with bilateral handrails and cues for step sequencing. Pt picks up cone from floor with minA for safety. WC transport back to room. Left seated with all  needs within reach.   2nd Session: Pt received supine in bed and agrees to therapy. No complaint of pain. Supine to sit with bed features and cues for positioning at EOB. Pt performs sit to stand and ambulatory transfer to [facility] with CGA/minA and cues for hand placement and sequencing. WC transport to gym. Pt performs Berg balance test, as detailed above, indicating high risk for falls. Pt then completes TUG test, with following scores: 20.7 seconds,  18.0 seconds, and 15.7 seconds. Pt does require use of bilateral upper extremities to push off from mat for sit to stand. Pt then completes 5x Sit to Stand, lightly pushing off thighs with upper extremities for each rep. Pt records following scores: 19.1 seconds, 14.4 seconds, and 12.3 seconds. Pt takes extended seated rest break, then completes 6 minute walk test, with score of 1181'. WC transport back to room. Stand step to bed with CGA/mindA. Left supine in bed with alarm intact and all needs within reach.   Mobility Transfers Transfers: Sit to Stand;Stand to Sit;Stand Pivot Transfers Sit to Stand: Contact Guard/Touching assist Stand to Sit: Contact Guard/Touching assist Stand Pivot Transfers: Contact Guard/Touching assist Transfer (Assistive device): None Locomotion  Gait Ambulation: Yes Gait Assistance: Minimal Assistance - Patient > 75% Assistive device: None Gait Assistance Details: Tactile cues for posture;Verbal cues for sequencing;Verbal cues for gait pattern;Verbal cues for technique Gait Gait: Yes Gait Pattern: Impaired Gait Pattern: Decreased stride length (slightly ataxic) Gait velocity: decreased Stairs / Additional Locomotion Stairs: Yes Stairs Assistance: Minimal Assistance - Patient > 75% Stair Management Technique: Two rails Height of Stairs: 6 Ramp: Minimal Assistance - Patient >75% Curb: Minimal Assistance - Patient >75% Wheelchair Mobility Wheelchair Mobility: No   Discharge Criteria: Patient will be discharged from PT if patient refuses treatment 3 consecutive times without medical reason, if treatment goals not met, if there is a change in medical status, if patient makes no progress towards goals or if patient is discharged from hospital.  The above assessment, treatment plan, treatment alternatives and goals were discussed and mutually agreed upon: by patient  Beau Fanny, PT, DPT 10/11/2022, 4:22 PM

## 2022-10-12 DIAGNOSIS — I639 Cerebral infarction, unspecified: Secondary | ICD-10-CM | POA: Diagnosis not present

## 2022-10-12 DIAGNOSIS — I10 Essential (primary) hypertension: Secondary | ICD-10-CM

## 2022-10-12 LAB — GLUCOSE, CAPILLARY
Glucose-Capillary: 105 mg/dL — ABNORMAL HIGH (ref 70–99)
Glucose-Capillary: 119 mg/dL — ABNORMAL HIGH (ref 70–99)
Glucose-Capillary: 85 mg/dL (ref 70–99)
Glucose-Capillary: 108 mg/dL — ABNORMAL HIGH (ref 70–99)

## 2022-10-12 NOTE — Progress Notes (Signed)
PROGRESS NOTE   Subjective/Complaints:  Pt doing well, slept ok (baseline for him). Denies pain. LBM yesterday. Urinating well. Denies any other complaints or concerns today.   ROS: as per HPI. Denies CP, SOB, Abd pain/n/v/d/c   Objective:   EP PPM/ICD IMPLANT  Result Date: 10/10/2022 CONCLUSIONS:  1. Successful implantation of a [employer] Reveal LINQ implantable loop recorder for cryptogenic stroke  2. No early apparent complications. Kenneth Bunting, MD 10/10/2022 2:52 PM   No results for input(s): "WBC", "HGB", "HCT", "PLT" in the last 72 hours.  No results for input(s): "NA", "K", "CL", "CO2", "GLUCOSE", "BUN", "CREATININE", "CALCIUM" in the last 72 hours.   Intake/Output Summary (Last 24 hours) at 10/12/2022 1101 Last data filed at 10/12/2022 0941 Gross per 24 hour  Intake 477 ml  Output 1300 ml  Net -823 ml        Physical Exam: Vital Signs Blood pressure (!) 115/58, pulse (!) 57, temperature 97.6 F (36.4 C), temperature source Oral, resp. rate 18, height 5\' 10"  (1.778 m), weight 98.1 kg, SpO2 93 %.  Constitutional: No apparent distress. Appropriate appearance for age. Sitting in w/c HENT: No JVD. Neck Supple. Trachea midline. Atraumatic, normocephalic. +Bilateral hearing aides, HOH Eyes: PERRLA. EOMI. Visual fields grossly intact.  Cardiovascular: RRR, no murmurs/rub/gallops. Trace bilateral Edema-chronic per patient. Peripheral pulses 2+  Respiratory: CTAB. No rales, rhonchi, or wheezing. On RA.  Abdomen: + bowel sounds, normoactive. NTND  Skin: C/D/I. No apparent lesions.  PRIOR EXAMS: MSK:      No apparent deformity.      Strength:                RUE: 5-/5 SA, 5/5 EF, 5/5 EE, 5/5 WE, 5/5 FF, 5/5 FA                 LUE: 5-/5 SA, 5-/5 EF, 5-/5 EE, 5/5 WE, 5/5 FF, 5/5 FA                 RLE: 5/5 HF, 5/5 KE, 5/5 DF, 5/5 EHL, 5/5 PF                 LLE:  5/5 HF, 5/5 KE, 5/5 DF, 5/5 EHL, 5/5 PF    Neurologic  exam:  Cognition: AAO to person, place, time and event.  Language: Fluent, No substitutions or neoglisms. Mild speech impediment baseline per patient. Names 3/3 objects correctly Memory: Recalls 3/3 objects at 5 minutes. No apparent deficits  Insight: Good insight into current condition.  Mood: Pleasant affect, appropriate mood.  Sensation: To light touch intact in BL UEs and LEs  Reflexes: 2+ in BL UE and LEs. Negative Hoffman's and babinski signs bilaterally.  CN: 2-12 grossly intact.  Coordination: Mild L>R UE ataxia; no LE ataxia. Right truncal drift with eyes closed.  Spasticity: MAS 0 in all extremities.  Gait: Not observed  Assessment/Plan: 1. Functional deficits which require 3+ hours per day of interdisciplinary therapy in a comprehensive inpatient rehab setting. Physiatrist is providing close team supervision and 24 hour management of active medical problems listed below. Physiatrist and rehab team continue to assess barriers to discharge/monitor patient progress toward functional and medical goals  Care Tool:  Bathing    Body parts bathed by patient: Right arm, Left arm, Chest, Abdomen, Front perineal area, Buttocks, Right upper leg, Left upper leg, Right lower leg, Left lower leg, Face         Bathing assist Assist Level: Contact Guard/Touching assist     Upper Body Dressing/Undressing Upper body dressing   What is the patient wearing?: Pull over shirt    Upper body assist Assist Level: Contact Guard/Touching assist    Lower Body Dressing/Undressing Lower body dressing      What is the patient wearing?: Pants, Underwear/pull up     Lower body assist Assist for lower body dressing: Contact Guard/Touching assist     Toileting Toileting    Toileting assist Assist for toileting: Contact Guard/Touching assist     Transfers Chair/bed transfer  Transfers assist     Chair/bed transfer assist level: Minimal Assistance - Patient > 75%      Locomotion Ambulation   Ambulation assist      Assist level: Minimal Assistance - Patient > 75% Assistive device: No Device Max distance: 200'   Walk 10 feet activity   Assist     Assist level: Minimal Assistance - Patient > 75% Assistive device: No Device   Walk 50 feet activity   Assist    Assist level: Minimal Assistance - Patient > 75% Assistive device: No Device    Walk 150 feet activity   Assist    Assist level: Minimal Assistance - Patient > 75% Assistive device: No Device    Walk 10 feet on uneven surface  activity   Assist     Assist level: Minimal Assistance - Patient > 75%     Wheelchair     Assist Is the patient using a wheelchair?: Yes Type of Wheelchair: Manual    Wheelchair assist level: Dependent - Patient 0% Max wheelchair distance: 150'    Wheelchair 50 feet with 2 turns activity    Assist        Assist Level: Dependent - Patient 0%   Wheelchair 150 feet activity     Assist      Assist Level: Dependent - Patient 0%   Blood pressure (!) 115/58, pulse (!) 57, temperature 97.6 F (36.4 C), temperature source Oral, resp. rate 18, height 5\' 10"  (1.778 m), weight 98.1 kg, SpO2 93 %.  Medical Problem List and Plan: 1. 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  Antithrombotics: -DVT/anticoagulation:  Pharmaceutical: Lovenox 40mg  QD             -antiplatelet therapy: DAPT X 3 weeks followed by Plavix alone.  3. Pain Management:  N/A. Tylenol prn available.  4. Mood/Behavior/Sleep: LCSW to follow for evaluation and support.             --Trazodone 25-50mg  PRN and melatonin 3mg  prn for insomnia.              -antipsychotic agents: N/A 5. Neuropsych/cognition: This patient is capable of  making decisions on his own behalf. 6. Skin/Wound Care: Routine pressure relief measures.  7. Fluids/Electrolytes/Nutrition: Monitor I/O. Appetite low likely due to [facility] but likes it that way.             --  Check CMET on 06/03.  8. T2DM: Hgb A1C-5.6 and well controlled. Was on metformin, Monjaro and Insulin glargine. Tends to skip meals.              --Continue to hold metformin. Monjaro not on formulary. Semglee 20U QD --SSI for elevated BS--currently 79- 101 range.  -6/1-2/24 great control, cont regimen CBG (last 3)  Recent Labs    10/11/22 1657 10/11/22 2105 10/12/22 0623  GLUCAP 127* 93 105*    9. HTN: Monitor BP TID.  Intermittent bradycardia HR in high 50's.  --Continue hold Benazepril and [place] as BP controlled off meds.  -6/1-2/24 BPs controlled, monitor Vitals:   10/10/22 1703 10/10/22 2040 10/11/22 0642 10/11/22 1259  BP: 136/63 (!) 134/58 129/62 120/61   10/11/22 1942 10/12/22 0449  BP: 120/64 (!) 115/58    10. Dyslipidemia Hx: Will decrease Lipitor to home dose of 40 mg. LDL-45 HDL- 18 (lower despite statin). Continue zetia 10mg  QD 11. OSA: Has declined CPAP 12. Prostate CA: Resume Flomax 0.4mg  QD 13. Anemia/Hx of iron deficiency: Will check anemia panel Monday 10/13/22.  --H/H stable in 12 range.    LOS: 2 days A FACE TO FACE EVALUATION WAS PERFORMED  [address] Spr[address]ley 10/12/2022, 11:01 AM

## 2022-10-12 NOTE — Progress Notes (Addendum)
Physical Therapy Session Note  Patient Details  Name: Kenneth [name] MRN: [id number] Date of Birth: 19-Oct-1950  Today's Date: 10/12/2022 PT Individual Time: 1101-1201 PT Individual Time Calculation (min): 60 min   Short Term Goals: Week 1:  PT Short Term Goal 1 (Week 1): STGs = LTGs  Skilled Therapeutic Interventions/Progress Updates: Pt presents sitting in w/c and agreeable to therapy.  Pt transfers sit to stand w/ CGA.  Pt amb to main gym w/ CGA and no AD.  Pt challenged w/ quick stop/starts w/o LOB throughout session.  Pt performed sit to stand blocks of 10 on [employer] cushion, verbal cues for forward lean especially.  Pt amb w/o AD in hallways given verbal cues for looking for items on walls while maintaining speed.  Pt amb to [employer] for endurance.  Pt performed toe-walking, marching and then stepping over hurdles w/o LOB.  Pt returned to room and handed off to NT for use of BR.       Therapy Documentation Precautions:  Precautions Precautions: Fall Restrictions Weight Bearing Restrictions: No General:   Vital Signs:  Pain:0/10 Pain Assessment Pain Scale: 0-10 Pain Score: 0-No pain    Therapy/Group: Individual Therapy  Lucio Edward 10/12/2022, 12:36 PM

## 2022-10-13 ENCOUNTER — Telehealth: Payer: Self-pay

## 2022-10-13 ENCOUNTER — Encounter (HOSPITAL_COMMUNITY): Payer: Self-pay | Admitting: Internal Medicine

## 2022-10-13 DIAGNOSIS — I639 Cerebral infarction, unspecified: Secondary | ICD-10-CM | POA: Diagnosis not present

## 2022-10-13 LAB — IRON AND TIBC
Iron: 66 ug/dL (ref 45–182)
Saturation Ratios: 27 % (ref 17.9–39.5)
TIBC: 246 ug/dL — ABNORMAL LOW (ref 250–450)
UIBC: 180 ug/dL

## 2022-10-13 LAB — CBC WITH DIFFERENTIAL/PLATELET
Abs Immature Granulocytes: 0.03 10*3/uL (ref 0.00–0.07)
Basophils Absolute: 0.1 10*3/uL (ref 0.0–0.1)
Basophils Relative: 1 %
Eosinophils Absolute: 0.2 10*3/uL (ref 0.0–0.5)
Eosinophils Relative: 3 %
HCT: 36.6 % — ABNORMAL LOW (ref 39.0–52.0)
Hemoglobin: 12.1 g/dL — ABNORMAL LOW (ref 13.0–17.0)
Immature Granulocytes: 0 %
Lymphocytes Relative: 18 %
Lymphs Abs: 1.4 10*3/uL (ref 0.7–4.0)
MCH: 29.3 pg (ref 26.0–34.0)
MCHC: 33.1 g/dL (ref 30.0–36.0)
MCV: 88.6 fL (ref 80.0–100.0)
Monocytes Absolute: 1.3 10*3/uL — ABNORMAL HIGH (ref 0.1–1.0)
Monocytes Relative: 17 %
Neutro Abs: 4.7 10*3/uL (ref 1.7–7.7)
Neutrophils Relative %: 61 %
Platelets: 174 10*3/uL (ref 150–400)
RBC: 4.13 MIL/uL — ABNORMAL LOW (ref 4.22–5.81)
RDW: 12.9 % (ref 11.5–15.5)
WBC: 7.6 10*3/uL (ref 4.0–10.5)
nRBC: 0 % (ref 0.0–0.2)

## 2022-10-13 LAB — COMPREHENSIVE METABOLIC PANEL
ALT: 16 U/L (ref 0–44)
AST: 14 U/L — ABNORMAL LOW (ref 15–41)
Albumin: 3.1 g/dL — ABNORMAL LOW (ref 3.5–5.0)
Alkaline Phosphatase: 43 U/L (ref 38–126)
Anion gap: 8 (ref 5–15)
BUN: 15 mg/dL (ref 8–23)
CO2: 26 mmol/L (ref 22–32)
Calcium: 8.6 mg/dL — ABNORMAL LOW (ref 8.9–10.3)
Chloride: 102 mmol/L (ref 98–111)
Creatinine, Ser: 0.92 mg/dL (ref 0.61–1.24)
GFR, Estimated: >60 mL/min (ref >60)
Glucose, Bld: 132 mg/dL — ABNORMAL HIGH (ref 70–99)
Potassium: 3.8 mmol/L (ref 3.5–5.1)
Sodium: 136 mmol/L (ref 135–145)
Total Bilirubin: 1.4 mg/dL — ABNORMAL HIGH (ref 0.3–1.2)
Total Protein: 5.6 g/dL — ABNORMAL LOW (ref 6.5–8.1)

## 2022-10-13 LAB — RETICULOCYTES
Immature Retic Fract: 13.2 % (ref 2.3–15.9)
RBC.: 4.11 MIL/uL — ABNORMAL LOW (ref 4.22–5.81)
Retic Count, Absolute: 91.2 10*3/uL (ref 19.0–186.0)
Retic Ct Pct: 2.2 % (ref 0.4–3.1)

## 2022-10-13 LAB — GLUCOSE, CAPILLARY
Glucose-Capillary: 100 mg/dL — ABNORMAL HIGH (ref 70–99)
Glucose-Capillary: 106 mg/dL — ABNORMAL HIGH (ref 70–99)
Glucose-Capillary: 79 mg/dL (ref 70–99)
Glucose-Capillary: 83 mg/dL (ref 70–99)

## 2022-10-13 LAB — FERRITIN: Ferritin: 267 ng/mL (ref 24–336)

## 2022-10-13 LAB — VITAMIN B12: Vitamin B-12: 102 pg/mL — ABNORMAL LOW (ref 180–914)

## 2022-10-13 LAB — FOLATE: Folate: 13.8 ng/mL (ref >5.9)

## 2022-10-13 MED ORDER — POTASSIUM CHLORIDE 20 MEQ PO PACK
20.0000 meq | PACK | Freq: Once | ORAL | Status: AC
  Administered 2022-10-13: 20 meq via ORAL
  Filled 2022-10-13: qty 1

## 2022-10-13 NOTE — Telephone Encounter (Signed)
Kenneth [name] is returning call on behalf of the patient. She requested that the call be returned to the patient.

## 2022-10-13 NOTE — Progress Notes (Signed)
Physical Therapy Session Note  Patient Details  Name: Kenneth [name] MRN: [id number] Date of Birth: Apr 30, 1951  Today's Date: 10/13/2022 PT Individual Time: [phone] and [phone] PT Individual Time Calculation (min): 73 min and 56 min  Short Term Goals: Week 1:  PT Short Term Goal 1 (Week 1): STGs = LTGs  Skilled Therapeutic Interventions/Progress Updates:   Treatment Session 1 Received pt semi-reclined in bed, pt agreeable to PT treatment, and denied any pain during session. Session with emphasis on functional mobility/transfers, generalized strengthening and endurance, dynamic standing balance/coordination, NMR, and gait training. Pt transferred semi-reclined<>sitting EOB with HOB elevated and supervision. Stood from EOB without AD and CGA and ambulated 174ft x2 trials without AD and CGA to/from main therapy gym. Pt performed the following activities inside // bars with emphasis on dynamic standing balance/coordination, proprioception, and spatial awareness:  -lateral step ups onto 6in step with 1 UE support 2x10 bilaterally with CGA/min A - pt did not place feet appropriately and flipped step over resulting in large LOB -forward tandem walking on Airex x55ft with 1 UE support and CGA for balance -lateal stepping on Airex x51ft with 1 UE support fading to 2 fingertip support with CGA/min A for balance - pt getting feet caught on Airex resulting in unsteadiness -retro walking on Airex x27ft with 1UE support and min A fading to CGA -static standing balance on Rockerboard for 1 minute x 3 trials with CGA - pt with 2 LOB requiring external assist to correct balance -squats on Rockerboard 2x10 with bilateral fingertip support and CGA for balance to fatigue  In hallway, pt performed monster squats x5ft with 1UE support on railing and CGA for balance with emphasis on quad strength. Pt limited by fatigue stating "[name], I can't do this anymore my thighs are burning". Of note, pt's biggest  limitations include aerobic endurance and balance and as session continued, pt required more frequent rest beaks. Transitioned to UE/core exercises and pt performed the following exercises: -standing bicep curls with 5lb dumbbell 2x10 bilaterally  -seated (per pt request) tricep extensions with 6lb dumbbell 2x10 -seated trunk rotations with medicine ball x10 bilaterally Returned to room and concluded session with pt sitting in recliner with all needs within reach  Treatment Session 2 Received pt sitting in recliner, pt agreeable to PT treatment, and denied any pain during session. Session with emphasis on functional mobility/transfers, generalized strengthening and endurance, dynamic standing balance/coordination, NMR, and gait training. Pt stood without AD and supervision and ambulated 145ft without AD and close supervision to dayroom. Pt performed seated BUE/BLE strengthening on workload 6 decreasing to workload 5 for 5 minutes transitioning to BLE strengthening only on workload 5 for additional 5 minutes for a total of 565 steps with emphasis on cardiovascular endurance and global strengthening.   Pt with questions regarding DME upon discharge. Located SPC to trial and pt ambulated additional 131ft with SPC and distant supervision - pt demonstrating improved stability with increased cadence and arm swing on L when using SPC. Pt performed the following activities on Biodex with emphasis on dynamic standing balance/coordination and proprioception: -weight shift training on level static for 1 minute and 45 seconds with BUE support, fading to 1UE support, and no UE support -weight shift training on level 6 with BUE support fading to 1UE support for 1.5 minutes -limits of stability training on level static with BUE support fading to 1UE support for 56 seconds -limits of stability training on level 6 with BUE support for 30-35 seconds x  2 trials   Pt performed forward stepping x 4 laps with 1 foot in  each square and lateral side stepping x 4 laps inside agility ladder without UE support and emphasis on dynamic standing balance/coordination. Pt ambulated additional >268ft with SPC and distant supervision back to room and transferred sit<>supine with supervision/mod I. Concluded session with pt semi-reclined in bed, needs within reach, and bed alarm on.  Therapy Documentation Precautions:  Precautions Precautions: Fall Restrictions Weight Bearing Restrictions: No  Therapy/Group: Individual Therapy Marlana Salvage Zaunegger Blima Rich PT, DPT 10/13/2022, 6:53 AM

## 2022-10-13 NOTE — Telephone Encounter (Addendum)
Attempted to call for same day d/c. Left voicemail.

## 2022-10-13 NOTE — Telephone Encounter (Signed)
Pt still in the hospital.

## 2022-10-13 NOTE — IPOC Note (Signed)
Overall Plan of Care [facility]) Patient Details Name: Kenneth [name] MRN: [id number] DOB: Feb 09, 1951  Admitting Diagnosis: Stroke (cerebrum) [facility])  Hospital Problems: Principal Problem:   Stroke (cerebrum) (HCC)     Functional Problem List: Nursing Bladder, Endurance, Medication Management, Safety  PT Balance, Endurance, Motor, Safety  OT Balance, Endurance, Motor, Sensory  SLP    TR         Basic ADL's: OT Grooming, Bathing, Dressing, Toileting     Advanced  ADL's: OT       Transfers: PT Bed Mobility, Bed to Chair, Customer service manager, Tub/Shower     Locomotion: PT Ambulation, Stairs     Additional Impairments: OT Fuctional Use of Upper Extremity  SLP        TR      Anticipated Outcomes Item Anticipated Outcome  Self Feeding no goal  Swallowing      Basic self-care  MOD I  Toileting  MOD I   Bathroom Transfers MOD I  Bowel/Bladder  manage bladder w mod I assist  Transfers  Mod(I)  Locomotion  Mod(I)  Communication     Cognition     Pain  n/a  Safety/Judgment  manage w cues   Therapy Plan: PT Intensity: Minimum of 1-2 x/day ,45 to 90 minutes PT Frequency: 5 out of 7 days PT Duration Estimated Length of Stay: 7 days OT Intensity: Minimum of 1-2 x/day, 45 to 90 minutes OT Frequency: 5 out of 7 days OT Duration/Estimated Length of Stay: 7-10     Team Interventions: Nursing Interventions Bladder Management, Medication Management, Discharge Planning, Patient/Family Education, Disease Management/Prevention  PT interventions Ambulation/gait training, Community reintegration, DME/adaptive equipment instruction, Neuromuscular re-education, Psychosocial support, Stair training, UE/LE Strength taining/ROM, Warden/ranger, Discharge planning, Pain management, Skin care/wound management, Therapeutic Activities, UE/LE Coordination activities, Cognitive remediation/compensation, Disease management/prevention, Functional mobility training,  Patient/family education, Splinting/orthotics, Therapeutic Exercise, Visual/perceptual remediation/compensation  OT Interventions Warden/ranger, DME/adaptive equipment instruction, Patient/family education, Therapeutic Activities, Therapeutic Exercise, Psychosocial support, Functional electrical stimulation, Community reintegration, Functional mobility training, Self Care/advanced ADL retraining, UE/LE Strength taining/ROM, UE/LE Coordination activities, Skin care/wound managment, Neuromuscular re-education, Discharge planning, Disease mangement/prevention, Pain management, Splinting/orthotics, Visual/perceptual remediation/compensation  SLP Interventions    TR Interventions    SW/CM Interventions Discharge Planning, Psychosocial Support, Patient/Family Education, Disease Management/Prevention   Barriers to Discharge MD  Medical stability  Nursing Lack of/limited family support, Home environment access/layout 2 level 2 ste bil rails, main B+B w spouse who has been in LTAC for 7 weeks now; sister to assist prn  PT Decreased caregiver support    OT Decreased caregiver support    SLP      SW Lack of/limited family support, Decreased caregiver support, Home environment access/layout, Inaccessible home environment No caregiver, MOD I goals.   Team Discharge Planning: Destination: PT-Home ,OT- Home , SLP-  Projected Follow-up: PT-Outpatient PT, OT-  Outpatient OT, Home health OT, SLP-  Projected Equipment Needs: PT-To be determined, OT- To be determined, SLP-  Equipment Details: PT- , OT-  Patient/family involved in discharge planning: PT- Patient,  OT-Patient, SLP-   MD ELOS: 7-10 days Medical Rehab Prognosis:  Excellent Assessment: The patient has been admitted for CIR therapies with the diagnosis of bilateral ACA stroke. The team will be addressing functional mobility, strength, stamina, balance, safety, adaptive techniques and equipment, self-care, bowel and bladder mgt,  patient and caregiver education. Goals have been set at modI. Anticipated discharge destination is home.       See Team  Conference Notes for weekly updates to the plan of care

## 2022-10-13 NOTE — Progress Notes (Signed)
Inpatient Rehabilitation Center Individual Statement of Services  Patient Name:  CORDERA BORSELLINO  Date:  10/13/2022  Welcome to the Inpatient Rehabilitation Center.  Our goal is to provide you with an individualized program based on your diagnosis and situation, designed to meet your specific needs.  With this comprehensive rehabilitation program, you will be expected to participate in at least 3 hours of rehabilitation therapies Monday-Friday, with modified therapy programming on the weekends.  Your rehabilitation program will include the following services:  Physical Therapy (PT), Occupational Therapy (OT), Speech Therapy (ST), 24 hour per day rehabilitation nursing, Therapeutic Recreaction (TR), Neuropsychology, Care Coordinator, Rehabilitation Medicine, Nutrition Services, Pharmacy Services, and Other  Weekly team conferences will be held on Wednesdays to discuss your progress.  Your Inpatient Rehabilitation Care Coordinator will talk with you frequently to get your input and to update you on team discussions.  Team conferences with you and your family in attendance may also be held.  Expected length of stay: 7-10 Days  Overall anticipated outcome:  MOD I  Depending on your progress and recovery, your program may change. Your Inpatient Rehabilitation Care Coordinator will coordinate services and will keep you informed of any changes. Your Inpatient Rehabilitation Care Coordinator's name and contact numbers are listed  below.  The following services may also be recommended but are not provided by the Inpatient Rehabilitation Center:   Home Health Rehabiltiation Services Outpatient Rehabilitation Services    Arrangements will be made to provide these services after discharge if needed.  Arrangements include referral to agencies that provide these services.  Your insurance has been verified to be:   Medicare A & B Your primary doctor is:  [name], MD  Pertinent information will be  shared with your doctor and your insurance company.  Inpatient Rehabilitation Care Coordinator:  Lavera Guise, [place] [phone] or [phone]  Information discussed with and copy given to patient by: Andria Rhein, 10/13/2022, 10:12 AM

## 2022-10-13 NOTE — Progress Notes (Signed)
PROGRESS NOTE   Subjective/Complaints: No new complaints this morning  ROS: as per HPI. Denies CP, SOB, Abd pain/n/v/d/c   Objective:   No results found. Recent Labs    10/13/22 0712  WBC 7.6  HGB 12.1*  HCT 36.6*  PLT 174    Recent Labs    10/13/22 0712  NA 136  K 3.8  CL 102  CO2 26  GLUCOSE 132*  BUN 15  CREATININE 0.92  CALCIUM 8.6*     Intake/Output Summary (Last 24 hours) at 10/13/2022 1105 Last data filed at 10/13/2022 0740 Gross per 24 hour  Intake 594 ml  Output 725 ml  Net -131 ml        Physical Exam: Vital Signs Blood pressure 137/67, pulse (!) 59, temperature 97.7 F (36.5 C), resp. rate 18, height 5\' 10"  (1.778 m), weight 94.4 kg, SpO2 93 %.  Constitutional: No apparent distress. Appropriate appearance for age. Sitting in w/c HENT: No JVD. Neck Supple. Trachea midline. Atraumatic, normocephalic. +Bilateral hearing aides, HOH Eyes: PERRLA. EOMI. Visual fields grossly intact.  Cardiovascular: Bradycardia, no murmurs/rub/gallops. Trace bilateral Edema-chronic per patient. Peripheral pulses 2+  Respiratory: CTAB. No rales, rhonchi, or wheezing. On RA.  Abdomen: + bowel sounds, normoactive. NTND  Skin: C/D/I. No apparent lesions.  PRIOR EXAMS: MSK:      No apparent deformity.      Strength:                RUE: 5-/5 SA, 5/5 EF, 5/5 EE, 5/5 WE, 5/5 FF, 5/5 FA                 LUE: 5-/5 SA, 5-/5 EF, 5-/5 EE, 5/5 WE, 5/5 FF, 5/5 FA                 RLE: 5/5 HF, 5/5 KE, 5/5 DF, 5/5 EHL, 5/5 PF                 LLE:  5/5 HF, 5/5 KE, 5/5 DF, 5/5 EHL, 5/5 PF    Neurologic exam:  Cognition: AAO to person, place, time and event.  Language: Fluent, No substitutions or neoglisms. Mild speech impediment baseline per patient. Names 3/3 objects correctly Memory: Recalls 3/3 objects at 5 minutes. No apparent deficits  Insight: Good insight into current condition.  Mood: Pleasant affect, appropriate  mood.  Sensation: To light touch intact in BL UEs and LEs  Reflexes: 2+ in BL UE and LEs. Negative Hoffman's and babinski signs bilaterally.  CN: 2-12 grossly intact.  Coordination: Mild L>R UE ataxia; no LE ataxia. Right truncal drift with eyes closed.  Spasticity: MAS 0 in all extremities.  Gait: Not observed  Assessment/Plan: 1. Functional deficits which require 3+ hours per day of interdisciplinary therapy in a comprehensive inpatient rehab setting. Physiatrist is providing close team supervision and 24 hour management of active medical problems listed below. Physiatrist and rehab team continue to assess barriers to discharge/monitor patient progress toward functional and medical goals  Care Tool:  Bathing    Body parts bathed by patient: Right arm, Left arm, Chest, Abdomen, Front perineal area, Buttocks, Right upper leg, Left upper leg, Right lower leg,  Left lower leg, Face         Bathing assist Assist Level: Contact Guard/Touching assist     Upper Body Dressing/Undressing Upper body dressing   What is the patient wearing?: Pull over shirt    Upper body assist Assist Level: Contact Guard/Touching assist    Lower Body Dressing/Undressing Lower body dressing      What is the patient wearing?: Pants, Underwear/pull up     Lower body assist Assist for lower body dressing: Contact Guard/Touching assist     Toileting Toileting    Toileting assist Assist for toileting: Contact Guard/Touching assist     Transfers Chair/bed transfer  Transfers assist     Chair/bed transfer assist level: Minimal Assistance - Patient > 75%     Locomotion Ambulation   Ambulation assist      Assist level: Contact Guard/Touching assist Assistive device: No Device Max distance: 200+   Walk 10 feet activity   Assist     Assist level: Contact Guard/Touching assist Assistive device: No Device   Walk 50 feet activity   Assist    Assist level: Contact Guard/Touching  assist Assistive device: No Device    Walk 150 feet activity   Assist    Assist level: Contact Guard/Touching assist Assistive device: No Device    Walk 10 feet on uneven surface  activity   Assist     Assist level: Contact Guard/Touching assist Assistive device: Other (comment) (no device)   Wheelchair     Assist Is the patient using a wheelchair?: Yes Type of Wheelchair: Manual    Wheelchair assist level: Dependent - Patient 0% Max wheelchair distance: 150'    Wheelchair 50 feet with 2 turns activity    Assist        Assist Level: Dependent - Patient 0%   Wheelchair 150 feet activity     Assist      Assist Level: Dependent - Patient 0%   Blood pressure 137/67, pulse (!) 59, temperature 97.7 F (36.5 C), resp. rate 18, height 5\' 10"  (1.778 m), weight 94.4 kg, SpO2 93 %.  Medical Problem List and Plan: 1. 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  Antithrombotics: -DVT/anticoagulation:  Pharmaceutical: Lovenox 40mg  QD             -antiplatelet therapy: DAPT X 3 weeks followed by Plavix alone.  3. Pain Management:  N/A. Tylenol prn available.  4. Mood/Behavior/Sleep: LCSW to follow for evaluation and support.             --Trazodone 25-50mg  PRN and melatonin 3mg  prn for insomnia.              -antipsychotic agents: N/A 5. Neuropsych/cognition: This patient is capable of making decisions on his own behalf. 6. Skin/Wound Care: Routine pressure relief measures.  7. Fluids/Electrolytes/Nutrition: Monitor I/O. Appetite low likely due to [facility] but likes it that way.             --Check CMET on 06/03.  8. T2DM: Hgb A1C-5.6 and  well controlled. Was on metformin, Monjaro and Insulin glargine. Tends to skip  meals.              --Continue to hold metformin. Monjaro not on formulary. Semglee 20U QD D/c ISS Discussed that he can bring Trajeo from home  Discussed that he can bring his Freestyle Libre from home Decrease the blood sugars to [facility] Discussed plan to wean Semglee as needed  CBG (last 3)  Recent Labs    10/12/22 1654 10/12/22 2113 10/13/22 0601  GLUCAP 85 108* 100*    9. HTN: Monitor BP TID.  Intermittent bradycardia HR in high 50's.  --Continue hold Benazepril and [place] as BP controlled off meds.  -6/1-2/24 BPs controlled, monitor Vitals:   10/10/22 1703 10/10/22 2040 10/11/22 0642 10/11/22 1259  BP: 136/63 (!) 134/58 129/62 120/61   10/11/22 1942 10/12/22 0449 10/12/22 1450 10/12/22 2006  BP: 120/64 (!) 115/58 (!) 131/98 (!) 123/59   10/13/22 0600  BP: 137/67    10. Dyslipidemia Hx: Will decrease Lipitor to home dose of 40 mg. LDL-45 HDL- 18 (lower despite statin). Continue zetia 10mg  QD 11. OSA: Has declined CPAP 12. Prostate CA: Resume Flomax 0.4mg  QD 13. Anemia/Hx of iron deficiency: Will check anemia panel Monday 10/13/22.  --H/H stable in 12 range.  14. Decreased TIBC: discussed low TIBC but normal iron levels. Discussed that patient is asymptomatic so we decided not to start a supplement  15. Suboptimal potassium: supplement klor on 6/3     >50 minutes spent in discussion of his blood sugars, that he uses the freestyle libre for his blood sugars at home, discussed that family can bring from home, decreased his blood sugar checks to [facility], discussed plan to wean Semglee as tolerated, discussed low iron TIBC but normal iron levels, discussed that he does not feel any fatigue so we will defer iron supplemention, discussed suboptimal potassium and supplementation with klor LOS: 3 days A FACE TO FACE EVALUATION WAS PERFORMED  Artisha Capri P Elgin Carn 10/13/2022, 11:05 AM

## 2022-10-13 NOTE — Progress Notes (Signed)
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   History of colon polyps - adenomas 04/03/2010   Hyperlipidemia    Hypertension    Iron deficiency anemia, unspecified    OSA (obstructive sleep apnea)    refusing CPAP   Pneumonia 03/2022   w/effusion   Prostate cancer [facility])    Past Surgical History:  Past Surgical History:  Procedure Laterality Date   ANTERIOR CERVICAL DECOMP/DISCECTOMY FUSION N/A 10/01/2012   Procedure: ANTERIOR CERVICAL DECOMPRESSION/DISCECTOMY FUSION 2 LEVELS;  Surgeon: Tia Alert, MD;  Location: [facility] NEURO ORS;  Service: Neurosurgery;  Laterality: N/A;  Cervical five-six,Cervical six-seven   COLONOSCOPY  09/30/2010, 03/01/2014   diverticulosis, small internal hemorrhoids   COLONOSCOPY W/ POLYPECTOMY  04/03/2010   4 polyps 8-35mm, worst TV adenoma with high-grade dysplasia   LOOP RECORDER INSERTION N/A 10/10/2022   Procedure: LOOP RECORDER INSERTION;  Surgeon: Marinus Maw, MD;  Location: [facility] INVASIVE CV LAB;  Service: Cardiovascular;  Laterality: N/A;   NO PAST SURGERIES     POLYPECTOMY     POSTERIOR CERVICAL FUSION/FORAMINOTOMY Left 10/20/2012   Procedure: C/5-6,C/6-7 Lami/Multi level,POSTERIOR CERVICAL FUSION/FORAMINOTOMY C/4-7;  Surgeon: Tia Alert, MD;  Location: [facility] NEURO ORS;  Service: Neurosurgery;  Laterality: Left;  Cervical Five-Six, Cervical Six-Seven Laminectomies, Posterior Cervical Fusion/Foraminotomies Cervical Four through Seven.    TONSILLECTOMY     Social History:  reports that he quit smoking about 53  years ago. His smoking use included cigarettes. He has a 0.50 pack-year smoking history. He has never used smokeless tobacco. He reports that he does not drink alcohol and does not use drugs.  Family / Support Systems Marital Status: Married Patient Roles: Spouse Spouse/Significant Other: Currently hospitalized Children: n/a Other Supports: Kenneth [name], sister and Kenneth [name] Anticipated Caregiver: N/A Ability/Limitations of Caregiver: No caregiver, spouse admitted to [facility] since April. Caregiver Availability: Other (Comment) (Limited/no caregiver.) Family Dynamics: support from sister  Social History Preferred language: English Religion: Jewish Cultural Background: Independent, working (owns a buisness), spouse currenltly hospitalized. Education: HS Health Literacy - How often do you need to have someone help you when you read instructions, pamphlets, or other written material from your doctor or pharmacy?: Never Writes: Yes Employment Status: Employed Name of Employer: IT trainer (self-emplyed) [address] [place], [place] Return to Work Plans: TBD Marine scientist Issues: n/a Guardian/Conservator: n/a   Abuse/Neglect Abuse/Neglect Assessment Can Be Completed: Yes Physical Abuse: Denies Verbal Abuse: Denies Sexual Abuse: Denies Exploitation of patient/patient's resources: Denies Self-Neglect: Denies  Patient response to: Social Isolation - How often do you feel lonely or isolated from those around you?: Never  Emotional Status Pt's affect, behavior and adjustment status: Pleasant Recent Psychosocial Issues: Coping Psychiatric History: n/a Substance Abuse History: n/a  Patient / Family Perceptions, Expectations & Goals Pt/Family understanding of illness & functional limitations: yes Premorbid pt/family roles/activities: Independent, working and driving Anticipated changes in roles/activities/participation: Patient discharging home anticipating MOD I  goals. Pt/family expectations/goals: MOD I goals (limited/no caregiver)  [employer]: None Premorbid Home Care/DME  Agencies: Other (Comment) (BSC, Shower seat, RW, transport chair and SPC) Transportation available at discharge: Family able to transport patient Is the patient able to respond to transportation needs?: Yes In the past 12 months, has lack of transportation kept you from medical appointments or from getting medications?: No In the past 12 months, has lack of transportation kept you from meetings, work, or from getting things needed for daily living?: No Resource referrals recommended: Neuropsychology  Discharge Planning Living Arrangements: Spouse/significant other, Other relatives Support Systems: Spouse/significant other, Other relatives Type of Residence: Private residence (2 level home, 2 steps to enter (able to stay on main level)) Insurance Resources: Medicare (BCBS of [place]) Surveyor, quantity Resources: Employment Financial Screen Referred: No Living Expenses: Psychologist, sport and exercise Management: Patient Does the patient have any problems obtaining your medications?: No Home Management: Independent Patient/Family Preliminary Plans: Patient plans to remain independent Care Coordinator Barriers to Discharge: Lack of/limited family support, Decreased caregiver support, Home environment access/layout, Inaccessible home environment Care Coordinator Barriers to Discharge Comments: No caregiver, MOD I goals. Care Coordinator Anticipated Follow Up Needs: HH/OP DC Planning Additional Notes/Comments: Patient anticipating MOD I goals due to not having a caregiver. Discharge address: [address], [place], [place] Expected length of stay: 7-10 Days  Clinical Impression Sw met with patient, introduced self and explained role. Patient will need to reach MOD I goals due to limited/no caregiver at discharge. Pt's spouse is currently admitted to [facility] (since April).  Patient has support from his sister, Kenneth [name]. Patient lives in a 2 level home with 2 steps to enter, patient expressed he is able to maintain on the main level. No additional questions or concerns, Sw will continue to FU with patient.   Andria Rhein 10/13/2022, 12:55 PM

## 2022-10-13 NOTE — Telephone Encounter (Signed)
-----   Message from [facility], [place] sent at 10/11/2022  8:06 AM EDT ----- MDT loop implanted 5/31 GT Cryptogenic stroke

## 2022-10-13 NOTE — Progress Notes (Signed)
Occupational Therapy Session Note  Patient Details  Name: Kenneth [name] MRN: [id number] Date of Birth: 1950-12-16  Today's Date: 10/13/2022 OT Individual Time: 1000-1100 OT Individual Time Calculation (min): 60 min    Short Term Goals: Week 1:  OT Short Term Goal 1 (Week 1): STG=LTG d/t ELOS  Skilled Therapeutic Interventions/Progress Updates:      Therapy Documentation Precautions:  Precautions Precautions: Fall Restrictions Weight Bearing Restrictions: No General: "Nice to meet you." Pt seated in recliner upon OT arrival, agreeable to OT.  Pain:No pain reported/noted.   ADL: Grooming: close supervision standing at sink with no AD for shaving/washing face. Pt with small knick from shaving; directed pt to apply pressure with clotting noted after several mins. Nurse notified and VC required to slow pace for shaving. Pt able to don/doff hearing aides Oral hygiene: SBA standing at sink with no AD, able to manipulate containers, pt with occasional posterior lean for 1 occurrence, able to correct with CGA while using sink to stabilize. UB dressing: doffing/donning overhead shirt and sweatshirt, SBA seated WOB LB dressing: doffing/donning SBA for shorts and underwear, seated EOB to thread pants using figure 4 technique, standing to manage over hips, no LOB/SOB Footwear: doffing/donning socks/tennis shoes seated EOB SBA, using figure 4 technique Shower transfer: CGA with no AD, no SOB/LOB reported/noted, ambulated from sink>shower ~ 10 feet  Bathing: SBA in standing, use of shower bench supporting back of legs, able to wash dry all body parts, AFIB device covered prior to shower Transfers: SBA with no device during household ambulation, no SOB/LOB  Education: Pt educated on general risks when taking blood thinners and to be cautious and slow when shaving to prevent skin tears. Extensive conversation on home/bathroom set up in order to prepare for DC planning and maximal independence.    Other Treatments:   Pt treatment session focus on ADL, pt demonstrating increased independence with functional mobility and functional endurance. Pt seated in recliner with chair alarm activated, call light within reach and 4Ps assessed.    Therapy/Group: Individual Therapy  Melvyn Novas, MS, OTR/L  10/13/2022, 3:33 PM

## 2022-10-13 NOTE — Progress Notes (Signed)
Inpatient Rehabilitation  Patient information reviewed and entered into eRehab system by Jedaiah Rathbun M. Mekiah Wahler, M.A., CCC/SLP, PPS Coordinator.  Information including medical coding, functional ability and quality indicators will be reviewed and updated through discharge.    

## 2022-10-14 DIAGNOSIS — I10 Essential (primary) hypertension: Secondary | ICD-10-CM | POA: Diagnosis not present

## 2022-10-14 DIAGNOSIS — E1149 Type 2 diabetes mellitus with other diabetic neurological complication: Secondary | ICD-10-CM | POA: Diagnosis not present

## 2022-10-14 DIAGNOSIS — F439 Reaction to severe stress, unspecified: Secondary | ICD-10-CM | POA: Diagnosis not present

## 2022-10-14 DIAGNOSIS — Z794 Long term (current) use of insulin: Secondary | ICD-10-CM

## 2022-10-14 LAB — GLUCOSE, CAPILLARY: Glucose-Capillary: 98 mg/dL (ref 70–99)

## 2022-10-14 NOTE — Progress Notes (Signed)
   10/14/22 1600  Spiritual Encounters  Type of Visit Initial  Care provided to: Patient  Referral source Patient request  Reason for visit Routine spiritual support  OnCall Visit No  Spiritual Framework  Presenting Themes Meaning/purpose/sources of inspiration  Values/beliefs Faith  Patient Stress Factors Health changes  Interventions  Spiritual Care Interventions Made Compassionate presence;Reflective listening;Normalization of emotions   Ch responded to request for Emotional support. Pt's family was not at bedside. Pt said his wife is also admitted to [facility]. He said that he feels lucky that the stroke did not cause more damage to his body. He is looking forward to recovery. Ch provided hospitality and built relationship of care and support. No follow-up needed at this time.

## 2022-10-14 NOTE — Progress Notes (Signed)
Occupational Therapy Session Note  Patient Details  Name: Kenneth [name] MRN: [id number] Date of Birth: 1950/08/08  Today's Date: 10/14/2022 OT Individual Time: 0800-0900 OT Individual Time Calculation (min): 60 min    Short Term Goals: Week 1:  OT Short Term Goal 1 (Week 1): STG=LTG d/t ELOS  Skilled Therapeutic Interventions/Progress Updates:   Pt open to all activity presented. Pt requesting shower this am. Requested OT remove the TTB in stall. Pt moved from supine to EOB with indep. Sit to stand with S only. Doffed clothing with close S and SLS performed with CGA.  Amb with SPC with S this session and stood at sink side for oral care with no LOB. Amb in and out of bathroom with SPC with close S. Stood for full bathing with intermittent grab bar hold no LOB even with eyes closed for hair washing with close S. Once EOB for dressing with set up only including tennis shoes and tying. Pt then amb to demo apt and back with close S using SPC. Performed stall shower frame to mirror home set up with close S. Seated rest with baseline HR maintained between 76-78 bpm. Pt returned to room, instructed in simple UE cane ex for between visits and left pt on chair pad alarm with all needs and nurse call button in reach. Pt mentioned his stressors and OT provided support and rec for neuropsych eval.   Pain:  denies all pain   Therapy Documentation Precautions:  Precautions Precautions: Fall Restrictions Weight Bearing Restrictions: No    Therapy/Group: Individual Therapy  Vicenta Dunning 10/14/2022, 7:35 AM

## 2022-10-14 NOTE — Progress Notes (Signed)
PROGRESS NOTE   Subjective/Complaints: No new complaints this morning He would like to speak with chaplain or neuropsych about his stress He would like to go outside for therapy  ROS: as per HPI. Denies CP, SOB, Abd pain/n/v/d/c, +stress   Objective:   No results found. Recent Labs    10/13/22 0712  WBC 7.6  HGB 12.1*  HCT 36.6*  PLT 174    Recent Labs    10/13/22 0712  NA 136  K 3.8  CL 102  CO2 26  GLUCOSE 132*  BUN 15  CREATININE 0.92  CALCIUM 8.6*     Intake/Output Summary (Last 24 hours) at 10/14/2022 1148 Last data filed at 10/14/2022 1010 Gross per 24 hour  Intake 958 ml  Output 550 ml  Net 408 ml        Physical Exam: Vital Signs Blood pressure 132/63, pulse (!) 59, temperature (!) 97.3 F (36.3 C), temperature source Oral, resp. rate 17, height 5\' 10"  (1.778 m), weight 95.9 kg, SpO2 96 %.  Constitutional: No apparent distress. Appropriate appearance for age. Sitting in w/c HENT: No JVD. Neck Supple. Trachea midline. Atraumatic, normocephalic. +Bilateral hearing aides, HOH Eyes: PERRLA. EOMI. Visual fields grossly intact.  Cardiovascular: Bradycardia, no murmurs/rub/gallops. Trace bilateral Edema-chronic per patient. Peripheral pulses 2+  Respiratory: CTAB. No rales, rhonchi, or wheezing. On RA.  Abdomen: + bowel sounds, normoactive. NTND  Skin: C/D/I. No apparent lesions.  PRIOR EXAMS: MSK:      No apparent deformity.      Strength:                RUE: 5-/5 SA, 5/5 EF, 5/5 EE, 5/5 WE, 5/5 FF, 5/5 FA                 LUE: 5-/5 SA, 5-/5 EF, 5-/5 EE, 5/5 WE, 5/5 FF, 5/5 FA                 RLE: 5/5 HF, 5/5 KE, 5/5 DF, 5/5 EHL, 5/5 PF                 LLE:  5/5 HF, 5/5 KE, 5/5 DF, 5/5 EHL, 5/5 PF    Neurologic exam:  Cognition: AAO to person, place, time and event.  Language: Fluent, No substitutions or neoglisms. Mild speech impediment baseline per patient. Names 3/3 objects  correctly Memory: Recalls 3/3 objects at 5 minutes. No apparent deficits  Insight: Good insight into current condition.  Mood: Pleasant affect, appropriate mood.  Sensation: To light touch intact in BL UEs and LEs  Reflexes: 2+ in BL UE and LEs. Negative Hoffman's and babinski signs bilaterally.  CN: 2-12 grossly intact.  Coordination: Mild L>R UE ataxia; no LE ataxia. Right truncal drift with eyes closed.  Spasticity: MAS 0 in all extremities.  Gait: Not observed Psych: motivated and pleasant  Assessment/Plan: 1. Functional deficits which require 3+ hours per day of interdisciplinary therapy in a comprehensive inpatient rehab setting. Physiatrist is providing close team supervision and 24 hour management of active medical problems listed below. Physiatrist and rehab team continue to assess barriers to discharge/monitor patient progress toward functional and medical goals  Care Tool:  Bathing    Body parts bathed by patient: Right arm, Left arm, Chest, Abdomen, Front perineal area, Buttocks, Right upper leg, Left upper leg, Right lower leg, Left lower leg, Face         Bathing assist Assist Level: Contact Guard/Touching assist     Upper Body Dressing/Undressing Upper body dressing   What is the patient wearing?: Pull over shirt    Upper body assist Assist Level: Contact Guard/Touching assist    Lower Body Dressing/Undressing Lower body dressing      What is the patient wearing?: Pants, Underwear/pull up     Lower body assist Assist for lower body dressing: Contact Guard/Touching assist     Toileting Toileting    Toileting assist Assist for toileting: Contact Guard/Touching assist     Transfers Chair/bed transfer  Transfers assist     Chair/bed transfer assist level: Supervision/Verbal cueing     Locomotion Ambulation   Ambulation assist      Assist level: Supervision/Verbal cueing Assistive device: Cane-straight Max distance: 127ft   Walk 10  feet activity   Assist     Assist level: Supervision/Verbal cueing Assistive device: Cane-straight   Walk 50 feet activity   Assist    Assist level: Supervision/Verbal cueing Assistive device: Cane-straight    Walk 150 feet activity   Assist    Assist level: Supervision/Verbal cueing Assistive device: Cane-straight    Walk 10 feet on uneven surface  activity   Assist     Assist level: Contact Guard/Touching assist Assistive device: Other (comment) (no device)   Wheelchair     Assist Is the patient using a wheelchair?: Yes Type of Wheelchair: Manual    Wheelchair assist level: Dependent - Patient 0% Max wheelchair distance: 150'    Wheelchair 50 feet with 2 turns activity    Assist        Assist Level: Dependent - Patient 0%   Wheelchair 150 feet activity     Assist      Assist Level: Dependent - Patient 0%   Blood pressure 132/63, pulse (!) 59, temperature (!) 97.3 F (36.3 C), temperature source Oral, resp. rate 17, height 5\' 10"  (1.778 m), weight 95.9 kg, SpO2 96 %.  Medical Problem List and Plan: 1. 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  Antithrombotics: -DVT/anticoagulation:  Pharmaceutical: Lovenox 40mg  QD             -antiplatelet therapy: DAPT X 3 weeks followed by Plavix alone.  3. Pain Management:  N/A. Tylenol prn available.  4. Mood/Behavior/Sleep: LCSW to follow for evaluation and support.             --Trazodone 25-50mg  PRN and melatonin 3mg  prn for insomnia.              -antipsychotic agents: N/A 5. Neuropsych/cognition: This patient is capable of making decisions on his own behalf. 6.  Skin/Wound Care: Routine pressure relief measures.  7. Fluids/Electrolytes/Nutrition: Monitor I/O. Appetite low likely due to [facility] but likes it that way.             --Check CMET  on 06/03.  8. T2DM: Hgb A1C-5.6 and well controlled. Was on metformin, Monjaro and Insulin glargine. Tends to skip meals.              --Continue to hold metformin. Monjaro not on formulary. Semglee 20U QD D/c ISS Discussed that he can bring Trajeo from home  Discussed that he can bring his Freestyle Libre from home Decrease the blood sugars to [facility] D/c semglee since started Trajeo D/ced CBGs and discussed with nursing that he may use Dexcom  CBG (last 3)  Recent Labs    10/13/22 1628 10/13/22 2040 10/14/22 0535  GLUCAP 79 83 98    9. HTN: Monitor BP TID.  Intermittent bradycardia HR in high 50's.  --Continue hold Benazepril and [place] as BP controlled off meds.   Vitals:   10/10/22 1703 10/10/22 2040 10/11/22 0642 10/11/22 1259  BP: 136/63 (!) 134/58 129/62 120/61   10/11/22 1942 10/12/22 0449 10/12/22 1450 10/12/22 2006  BP: 120/64 (!) 115/58 (!) 131/98 (!) 123/59   10/13/22 0600 10/13/22 1436 10/13/22 1936 10/14/22 0556  BP: 137/67 113/64 (!) 143/76 132/63    10. Dyslipidemia Hx: Will decrease Lipitor to home dose of 40 mg. LDL-45 HDL- 18 (lower despite statin). Continue zetia 10mg  QD 11. OSA: Has declined CPAP 12. Prostate CA: Continue Flomax 0.4mg  QD 13. Anemia/Hx of iron deficiency: Will check anemia panel Monday 10/13/22.  --H/H stable in 12 range.  14. Decreased TIBC: discussed low TIBC but normal iron levels. Discussed that patient is asymptomatic so we decided not to start a supplement  15. Suboptimal potassium: supplement klor on 6/3    16. Stress: placed chaplain consult  LOS: 4 days A FACE TO FACE EVALUATION WAS PERFORMED  Clint Bolder P Krislynn Gronau 10/14/2022, 11:48 AM

## 2022-10-14 NOTE — Progress Notes (Signed)
Occupational Therapy Session Note  Patient Details  Name: Kenneth [name] MRN: [id number] Date of Birth: 1950/05/18  Today's Date: 10/14/2022 OT Individual Time: 1435-1530 OT Individual Time Calculation (min): 55 min    Short Term Goals: Week 1:  OT Short Term Goal 1 (Week 1): STG=LTG d/t ELOS  Skilled Therapeutic Interventions/Progress Updates:     Therapy Documentation Precautions:  Precautions Precautions: Fall Restrictions Weight Bearing Restrictions: No General: "I was outside today." Pt seated in recliner upon OT arrival with family present, agreeable to OT.   Pain: No pain reported/noted  Transfers: -Pt ambulated room>< therapy gym with SPC at SBA with no LOB/SOB -sit to stands supervision for multiple trials throughout session   Exercises: Pt completed the following exercise circuit in order to improve functional activity, strength and endurance to prepare for ADLs such as bathing. Pt completed the following exercises seated EOM in unsupported position with red theraband with no noted LOB/SOB: -12X shoulder adbuction -12X shoulder flexion -12X triceps extension -12X bicep curls -12X supinated external rotation with 2-3 second hold -30X forward punches  -12X backward rows -12X shoulder extension in standing  Education: -Discussed no follow up OT but HEP for provided BUE endurance and general postural support    Pt seated in W/C at end of session with W/C alarm donned, call light within reach and 4Ps assessed.    Therapy/Group: Individual Therapy  Melvyn Novas, MS, OTR/L  10/14/2022, 3:57 PM

## 2022-10-14 NOTE — Progress Notes (Signed)
Physical Therapy Session Note  Patient Details  Name: Kenneth [name] MRN: [id number] Date of Birth: 1950/06/02  Today's Date: 10/14/2022 PT Individual Time: 1100-1125 and 1300-1355 PT Individual Time Calculation (min): 25 min and 55 min  Short Term Goals: Week 1:  PT Short Term Goal 1 (Week 1): STGs = LTGs  Skilled Therapeutic Interventions/Progress Updates:   Treatment Session 1 Received pt sitting in recliner, pt agreeable to PT treatment, and denied any pain during session. Session with emphasis on functional mobility/transfers, generalized strengthening and endurance, stair navigation, and gait training. Pt performed all transfers with [facility] and supervision throughout session - notified CSW of request for [facility] and updated safety plan. Pt ambulated 139ft with SPC and close supervision to main therapy gym - 1 instance of LOB requiring light min A to correct. Pt navigated 22 6in steps with R handrail and close supervision ascending with a step through pattern and descending alternating between step through and step to pattern - recommended step to pattern due to pt feeling "apprehensive". Ambulated 125ft with SPC and supervision to dayroom and performed standing BLE strengthening on Kinetron at 20 cm/sec for 1 minute x 4 trials with BUE support and emphasis on glute/quad strength. Ambulated 146ft with SPC and supervision back to room and concluded session with pt sitting in recliner with all needs within reach.   Treatment Session 2 Received pt sitting in recliner, pt agreeable to PT treatment, and denied any pain during session. Session with emphasis on functional mobility/transfers, generalized strengthening and endurance, dynamic standing balance/coordination, NMR, and gait training. Pt performed all transfers with and without SPC and supervision throughout session. Pt ambulated from room on 4W, to [facility] elevators, and outside entrance of [facility] to reflection fountain with SPC and supervision (~1,043ft).  Pt demonstrated improvements in cadence with increased arm swing and no LOB noted. Pt then ambulated additional >578ft x 3 over uneven surfaces (concrete) and up/down hills and slopes with SPC and supervision with a few standing rest breaks but no sitting rest breaks. Pt navigated 4 6in steps with 1 handrail and SPC with supervision ascending and descending with a step through pattern. Ambulated from outside back to main therapy gym on 4W (~860ft) with [facility] and supervision.   Pt performed alternating toe taps to 3 cones x 5 trials bilaterally without UE support and min A for balance - pt with 1 large posterior LOB onto mat. Pt performed standing mini-squats with alternating LE hip flexion, abduction, extension x10 in each direction bilaterally for a total of x30 on each LE. Worked on blocked practice sit<>stands on Airex 2x10 reps without UE support and CGA with emphasis on quad strength - emphasis on eccentric control and to avoid plopping. Pt then performed standing squats with 6lb medicine ball 3x10 reps to fatigue with emphasis on quad strength. Ambulated 161ft with SPC and supervision back to room and concluded session with pt sitting in recliner with all needs within reach.   Therapy Documentation Precautions:  Precautions Precautions: Fall Restrictions Weight Bearing Restrictions: No  Therapy/Group: Individual Therapy Marlana Salvage Zaunegger Blima Rich PT, DPT 10/14/2022, 6:48 AM

## 2022-10-15 DIAGNOSIS — E1149 Type 2 diabetes mellitus with other diabetic neurological complication: Secondary | ICD-10-CM | POA: Diagnosis not present

## 2022-10-15 DIAGNOSIS — I10 Essential (primary) hypertension: Secondary | ICD-10-CM | POA: Diagnosis not present

## 2022-10-15 DIAGNOSIS — Z794 Long term (current) use of insulin: Secondary | ICD-10-CM | POA: Diagnosis not present

## 2022-10-15 DIAGNOSIS — F439 Reaction to severe stress, unspecified: Secondary | ICD-10-CM | POA: Diagnosis not present

## 2022-10-15 MED ORDER — TRAZODONE HCL 50 MG PO TABS: 25.0000 mg | ORAL_TABLET | Freq: Every evening | ORAL | Status: DC | PRN

## 2022-10-15 NOTE — Patient Care Conference (Signed)
Inpatient RehabilitationTeam Conference and Plan of Care Update Date: 10/15/2022   Time: 11:56 AM    Patient Name: Kenneth [name]      Medical Record Number: [id number]  Date of Birth: 12-27-1950 Sex: Male         Room/Bed: 4W02C/4W02C-01 Payor Info: Payor: MEDICARE / Plan: MEDICARE PART A AND B / Product Type: *No Product type* /    Admit Date/Time:  10/10/2022  4:48 PM  Primary Diagnosis:  Stroke (cerebrum) [facility])  Hospital Problems: Principal Problem:   Stroke (cerebrum) [facility])    Expected Discharge Date: Expected Discharge Date: 10/17/22  Team Members Present: Physician leading conference: Dr. Sula Soda Social Worker Present: Lavera Guise, BSW Nurse Present: Chana Bode, RN PT Present: Raechel Chute, PT OT Present: Candee Furbish, OT PPS Coordinator present : Edson Snowball, PT     Current Status/Progress Goal Weekly Team Focus  Bowel/Bladder   pt continent of b/b   Remain continent   Assist with tolieting qshift and prn    Swallow/Nutrition/ Hydration               ADL's   Set up A UB, Supervision LB, Supervision toileting, progressing to mod I   Mod I for ADLs and transfers   Barriers: confidence with decision making for an independent/mod I level. Plan: having pt prep for ADL session including gathering all items and/or simple meal prep session to address safety with critical thinking skills    Mobility   bed mobility supervision, transfers with and without SPC CGA/supervision, gait 183ft with and without SPC close supervision/CGA   Mod I  balance/coordination, generalized strengthening and endurance, DME, follow up thearapy    Communication                Safety/Cognition/ Behavioral Observations               Pain   no c/o pain   Remain pain free   Assess qshift and prn    Skin   Skin intact   Maintain skin integrity  Assess qshift and prn      Discharge Planning:  Patient will need to reach MOD I goals due to limited/no  caregiver at discharge. Pt's spouse is currently admitted to [facility] (since April). Patient has support from his sister, [name]. Patient lives in a 2 level home with 2 steps to enter, patient expressed he is able to maintain on the main level.   Team Discussion: Patient post CVA and needs reassurance for confidence boosting for decision making and practice with meal prep tasks. Patient on target to meet rehab goals: yes, currently needs supervision for ambulation and steps. Goals for discharge set for mod I overall.  *See Care Plan and progress notes for long and short-term goals.   Revisions to Treatment Plan:  N/a  Teaching Needs: Safety, medications, dietary modifications, meal prep, etc.   Current Barriers to Discharge: Lack of/limited family support  Possible Resolutions to Barriers: Family education OP follow up services Has all DME     Medical Summary Current Status: type 2 diabetes, CVA, obesity  Barriers to Discharge: Medical stability  Barriers to Discharge Comments: type 2 diabetes, CVA, obesity Possible Resolutions to [employer] Focus: continue to monitor CBGs with Dexcom, continue aspirin and Lovenox and discussed possible interaction, provided dietary education   Continued Need for Acute Rehabilitation Level of Care: The patient requires daily medical management by a physician with specialized training in physical medicine and rehabilitation for the following  reasons: Direction of a multidisciplinary physical rehabilitation program to maximize functional independence : Yes Medical management of patient stability for increased activity during participation in an intensive rehabilitation regime.: Yes Analysis of laboratory values and/or radiology reports with any subsequent need for medication adjustment and/or medical intervention. : Yes   I attest that I was present, lead the team conference, and concur with the assessment and plan of the team.   Chana Bode B 10/15/2022, 3:44 PM

## 2022-10-15 NOTE — Progress Notes (Signed)
Patient ID: Kenneth [name], male   DOB: [birth year], 72 y.o.   MRN: [id number]  Team Conference Report to Patient/Family  Team Conference discussion was reviewed with the patient and caregiver, including goals, any changes in plan of care and target discharge date.  Patient and caregiver express understanding and are in agreement.  The patient has a target discharge date of 10/17/22.  Sw met with patient and provided team conference updates. Patient ready for d/c Friday. Patient will arrange transportation between 9 AM- 11 AM. No additional questions or concerns. Family will be present to review discharge information. Patient prefers OP at [facility].  Andria Rhein 10/15/2022, 1:18 PM

## 2022-10-15 NOTE — Progress Notes (Signed)
PROGRESS NOTE   Subjective/Complaints: No new complaints this morning Would like to d/c Sunday to get an extra day of therapy Appreciate chaplain visit  ROS: as per HPI. Denies CP, SOB, Abd pain/n/v/d/c, +stress   Objective:   No results found. Recent Labs    10/13/22 0712  WBC 7.6  HGB 12.1*  HCT 36.6*  PLT 174    Recent Labs    10/13/22 0712  NA 136  K 3.8  CL 102  CO2 26  GLUCOSE 132*  BUN 15  CREATININE 0.92  CALCIUM 8.6*     Intake/Output Summary (Last 24 hours) at 10/15/2022 1057 Last data filed at 10/15/2022 0733 Gross per 24 hour  Intake 480 ml  Output 200 ml  Net 280 ml        Physical Exam: Vital Signs Blood pressure 121/61, pulse 73, temperature (!) 97.4 F (36.3 C), temperature source Oral, resp. rate 18, height 5\' 10"  (1.778 m), weight 95.9 kg, SpO2 95 %.  Constitutional: No apparent distress. Appropriate appearance for age. Sitting in w/c HENT: No JVD. Neck Supple. Trachea midline. Atraumatic, normocephalic. +Bilateral hearing aides, HOH Cardio: Reg rate Chest: normal effort, normal rate of breathing Abd: soft, non-distended Ext: no edema Psych: pleasant, normal affect Skin: intact  PRIOR EXAMS: MSK:      No apparent deformity.      Strength:                RUE: 5-/5 SA, 5/5 EF, 5/5 EE, 5/5 WE, 5/5 FF, 5/5 FA                 LUE: 5-/5 SA, 5-/5 EF, 5-/5 EE, 5/5 WE, 5/5 FF, 5/5 FA                 RLE: 5/5 HF, 5/5 KE, 5/5 DF, 5/5 EHL, 5/5 PF                 LLE:  5/5 HF, 5/5 KE, 5/5 DF, 5/5 EHL, 5/5 PF    Neurologic exam:  Cognition: AAO to person, place, time and event.  Language: Fluent, No substitutions or neoglisms. Mild speech impediment baseline per patient. Names 3/3 objects correctly Memory: Recalls 3/3 objects at 5 minutes. No apparent deficits  Insight: Good insight into current condition.  Mood: Pleasant affect, appropriate mood.  Sensation: To light touch intact in  BL UEs and LEs  Reflexes: 2+ in BL UE and LEs. Negative Hoffman's and babinski signs bilaterally.  CN: 2-12 grossly intact.  Coordination: Mild L>R UE ataxia; no LE ataxia. Right truncal drift with eyes closed.  Spasticity: MAS 0 in all extremities.  Gait: Not observed Psych: motivated and pleasant  Assessment/Plan: 1. Functional deficits which require 3+ hours per day of interdisciplinary therapy in a comprehensive inpatient rehab setting. Physiatrist is providing close team supervision and 24 hour management of active medical problems listed below. Physiatrist and rehab team continue to assess barriers to discharge/monitor patient progress toward functional and medical goals  Care Tool:  Bathing    Body parts bathed by patient: Right arm, Left arm, Chest, Abdomen, Front perineal area, Buttocks, Right upper leg, Left upper leg, Right  lower leg, Left lower leg, Face         Bathing assist Assist Level: Contact Guard/Touching assist     Upper Body Dressing/Undressing Upper body dressing   What is the patient wearing?: Pull over shirt    Upper body assist Assist Level: Contact Guard/Touching assist    Lower Body Dressing/Undressing Lower body dressing      What is the patient wearing?: Pants, Underwear/pull up     Lower body assist Assist for lower body dressing: Contact Guard/Touching assist     Toileting Toileting    Toileting assist Assist for toileting: Contact Guard/Touching assist     Transfers Chair/bed transfer  Transfers assist     Chair/bed transfer assist level: Supervision/Verbal cueing     Locomotion Ambulation   Ambulation assist      Assist level: Supervision/Verbal cueing Assistive device: Cane-straight Max distance: 134ft   Walk 10 feet activity   Assist     Assist level: Supervision/Verbal cueing Assistive device: Cane-straight   Walk 50 feet activity   Assist    Assist level: Supervision/Verbal cueing Assistive  device: Cane-straight    Walk 150 feet activity   Assist    Assist level: Supervision/Verbal cueing Assistive device: Cane-straight    Walk 10 feet on uneven surface  activity   Assist     Assist level: Contact Guard/Touching assist Assistive device: Other (comment) (no device)   Wheelchair     Assist Is the patient using a wheelchair?: Yes Type of Wheelchair: Manual    Wheelchair assist level: Dependent - Patient 0% Max wheelchair distance: 150'    Wheelchair 50 feet with 2 turns activity    Assist        Assist Level: Dependent - Patient 0%   Wheelchair 150 feet activity     Assist      Assist Level: Dependent - Patient 0%   Blood pressure 121/61, pulse 73, temperature (!) 97.4 F (36.3 C), temperature source Oral, resp. rate 18, height 5\' 10"  (1.778 m), weight 95.9 kg, SpO2 95 %.  Medical Problem List and Plan: 1. 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  DVT prophylaxis: Discussed potential interactions between Lovenox 40mg  QD and aspirin but that Lovenox is recommended for clot prevention             -antiplatelet therapy: DAPT X 3 weeks followed by Plavix alone.   3. Pain Management:  N/A. Tylenol prn available.   4. Insomnia: continue Trazodone 25-50mg  PRN and melatonin 3mg  prn for insomnia.              -antipsychotic agents: N/A  5. Neuropsych/cognition: This patient is capable of making decisions on his own behalf. 6. Skin/Wound Care: Routine pressure relief measures.  7. Fluids/Electrolytes/Nutrition: Monitor I/O. Appetite low likely due to [facility]  but likes it that way.             --Check CMET on 06/03.  8. T2DM: Hgb A1C-5.6 and well controlled. Was on metformin, Monjaro and Insulin glargine. Tends to  skip meals.              --Continue to hold metformin. Monjaro not on formulary. Semglee 20U QD D/c ISS Discussed that he can bring Trajeo from home  Discussed that he can bring his Freestyle Libre from home Decrease the blood sugars to [facility] D/c semglee since started Trajeo D/ced CBGs and discussed with nursing that he may use Dexcom  CBG (last 3)  Recent Labs    10/13/22 1628 10/13/22 2040 10/14/22 0535  GLUCAP 79 83 98    9. HTN: Monitor BP TID.  Intermittent bradycardia HR in high 50's.  --Continue hold Benazepril and [place] as BP controlled off meds. Continue flomax  Vitals:   10/11/22 1259 10/11/22 1942 10/12/22 0449 10/12/22 1450  BP: 120/61 120/64 (!) 115/58 (!) 131/98   10/12/22 2006 10/13/22 0600 10/13/22 1436 10/13/22 1936  BP: (!) 123/59 137/67 113/64 (!) 143/76   10/14/22 0556 10/14/22 1249 10/14/22 2043 10/15/22 0636  BP: 132/63 (!) 123/58 (!) 126/52 121/61    10. Dyslipidemia Hx: Will decrease Lipitor to home dose of 40 mg. LDL-45 HDL- 18 (lower despite statin). Continue zetia 10mg  QD 11. OSA: Has declined CPAP 12. Prostate CA: Continue Flomax 0.4mg  QD 13. Anemia/Hx of iron deficiency: Will check anemia panel Monday 10/13/22.  --H/H stable in 12 range.  14. Decreased TIBC: discussed low TIBC but normal iron levels. Discussed that patient is asymptomatic so we decided not to start a supplement  15. Suboptimal potassium: supplement klor on 6/3    16. Stress: placed chaplain consult  LOS: 5 days A FACE TO FACE EVALUATION WAS PERFORMED  Drema Pry Dannielle Baskins 10/15/2022, 10:57 AM

## 2022-10-15 NOTE — Progress Notes (Signed)
Physical Therapy Session Note  Patient Details  Name: Kenneth [name] MRN: [id number] Date of Birth: 1950-06-24  Today's Date: 10/15/2022 PT Individual Time: 0816-0858 PT Individual Time Calculation (min): 42 min   Short Term Goals: Week 1:  PT Short Term Goal 1 (Week 1): STGs = LTGs  Skilled Therapeutic Interventions/Progress Updates:   Received pt sitting in recliner, pt agreeable to PT treatment, and denied any pain during session. Session with emphasis on functional mobility/transfers, generalized strengthening and endurance, dynamic standing balance/coordination, NMR, and gait training. Pt performed all transfers without [facility] and supervision throughout session (challenged without AD this session). Pt ambulated ~27ft without AD and supervision to main therapy gym - turned around to go back to room to get glasses. Pt performed lateral monster squats along railing in hallway x 257ft challenged with bending down to pick up/place cones and carry them during activity with CGA for balance. Worked on dynamic standing balance tossing ball against rebounder x20 reps bilaterally on Airex in semi-tandem stance with mod A overall for balance. Strapped 1.5lb ankle weights to bilateral ankles and carried 6lb dumbbell in RUE and pt ambulated 1,147ft with clsoe supervision alterating carrying dumbbell in RUE and LUE then transitioning it overhead to increase challenge. Pt then performed retro walking x162ft with 4 turns and CGA with 1.5lb ankle weights around ankles - cues to take larger steps to increase difficulty. Pt ambulated >250ft without AD and supervision back to room. Concluded session with pt sitting in recliner with all needs within reach.   Therapy Documentation Precautions:  Precautions Precautions: Fall Restrictions Weight Bearing Restrictions: No  Therapy/Group: Individual Therapy Marlana Salvage Zaunegger Blima Rich PT, DPT 10/15/2022, 7:07 AM

## 2022-10-15 NOTE — Telephone Encounter (Signed)
Patient remains in the hospital. . Per most recent note, anticipated d/c date 10/17/22.   Monitor has connected to [facility].

## 2022-10-15 NOTE — Progress Notes (Signed)
Occupational Therapy Session Note  Patient Details  Name: Kenneth [name] MRN: [id number] Date of Birth: August 27, 1950  Today's Date: 10/15/2022 OT Individual Time: 4098-1191 OT Individual Time Calculation (min): 72 min    Short Term Goals: Week 1:  OT Short Term Goal 1 (Week 1): STG=LTG d/t ELOS  Skilled Therapeutic Interventions/Progress Updates:  Skilled OT intervention completed with focus on ambulatory endurance, kitchen management education, standing tolerance, higher level problem solving, and dynamic balance. Pt received seated in recliner, agreeable to session. No pain reported.  Per nursing and prior therapists, pt with inquiry about taking himself to the bathroom and ambulating in room without assist. OT discussed with care team, then pt about decision to approve pt for mod I mobility/self-care in room as of 6/5 due to appropriate safety awareness and CLOF. Staff and pt made aware of sign placed on door.  Pt completed all sit > stands and ambulatory transfers without AD at independent level. Ambulated to commode, and was independent with all toileting steps.  Ambulated to ADL kitchen. In kitchen, pt demonstrated ability to reach into various cabinet heights, gather items including full pot of water and moderately heavy pans/items, open/close fridge door, all at the independent level without LOB or fatigue. Pt was able to prep a simple stove top meal via meal cards, including accurate sequencing, safety awareness and dual tasking. Discussed cutting and microwave safety with modified strategies for fatigue. Able to wash full set of dishes and return items back to cabinets independently.  Ambulated to next gym. Standing at [facility] without rest breaks ( > 10 mins) to address standing tolerance, higher level cognitive problem solving/reaction speed and dynamic balance: -Speed dot test- 1.50 sec reaction time; discussed how this relates to reaction speed with fall prevention within  home/community management -Letter chart- 80% accuracy, mod I -Visual/auditory memory sequence- 100% accuracy -Trail making A- 1.29 mins, 2 errors -Trail making B-1:47 mins, 4 errors  Seated EOM, pt requested to review HEP that was issued yesterday for BUE endurance and general posture. Completed the following, with min cues needed for form: (With red theraband) 10 reps Horizontal abduction Self-anchored shoulder flexion each arm Self-anchored bicep flexion each arm Self-anchored tricep extension each arm Therapist anchored scapular retraction each arm Alternating chest presses Shoulder external rotation  Ambulated back to room. Remained seated in recliner, with all needs met at end of session.   Therapy Documentation Precautions:  Precautions Precautions: Fall Restrictions Weight Bearing Restrictions: No    Therapy/Group: Individual Therapy  Melvyn Novas, MS, OTR/L  10/15/2022, 2:35 PM

## 2022-10-15 NOTE — Progress Notes (Signed)
Physical Therapy Session Note  Patient Details  Name: Kenneth [name] MRN: [id number] Date of Birth: 1950-10-09  Today's Date: 10/15/2022 PT Individual Time: 1000-1130 PT Individual Time Calculation (min): 90 min   Short Term Goals: Week 1:  PT Short Term Goal 1 (Week 1): STGs = LTGs  Skilled Therapeutic Interventions/Progress Updates:    Chart reviewed and pt agreeable to therapy. Pt received seated in recliner with no c/o pain. Also of note, pt found without alarm, so PT clarified safety plan with RN at end of session (see end of note). Session focused on high-level amb and balance to promote safe home and community mobility. Pt initiated session with long amb >1038ft for >60mins around hospital ground including terrains with hills, gravel, grass, curbs, steps, other uneven surfaces, and crosswalks. Pt displayed excellent progress towards independent mobility requiring distant supervision and no AD. Pt then amb around gift shop with instructions for dual tasking including item finding and price reading. Pt then returned to 4th floor with same assist level. In gym, pt completed toe tapping with CGA + HHA for 3 cones. Pt then practiced single cone and required close S with no HHA for multiple rounds of toe tapping. Pt then practice heel raises and heel raises + holds to promote ankle stability. Pt then returned to room with same assist of distant S + no AD. Of note, PT followed up on safety plan regarding alarm in room. Pt verbalized to PT and RN that he mostly called for assistance, but "sometimes" got up when assistance did not arrive in a timely manner. RN request pt use chair alarm since pt not yet cleared for independent mobility in room. PT and pt returned to room. Pt remained extremely polite while verbalizing desire to not be on alarm. PT explained fall prevention policies for CIR and plan to progress pt towards not using alarm. PT and pt discussed using alarm for the afternoon and confirming  with RN that pt will call for assistance, and then for safety plan to be reassessed to progress pt towards no alarm and eventually modI-Independence in room when closer to d/c. Pt continued to very politely express concern about toileting in a timely manner if on alarm. PT agreed with pt and recommended calling as early as possible. Pt agreeable to alarm for the afternoon and verbalized understanding of fall prevention policies. At end of session, pt was left seated in recliner with alarm engaged, nurse call bell and all needs in reach.     Therapy Documentation Precautions:  Precautions Precautions: Fall Restrictions Weight Bearing Restrictions: No General:       Therapy/Group: Individual Therapy  Dionne Milo, PT, DPT 10/15/2022, 11:32 AM

## 2022-10-16 NOTE — Progress Notes (Signed)
Occupational Therapy Session Note  Patient Details  Name: Kenneth [name] MRN: [id number] Date of Birth: 12-Apr-1951  Today's Date: 10/16/2022 OT Individual Time: 1405-1420 OT Individual Time Calculation (min): 15 min    Short Term Goals: Week 1:  OT Short Term Goal 1 (Week 1): STG=LTG d/t ELOS  Skilled Therapeutic Interventions/Progress Updates:  Skilled OT intervention completed with focus on ambulatory and cardiovascular endurance. Pt received seated in recliner, agreeable to make up missed mins from earlier session. No pain reported.  Completed all sit > stands and ambulatory transfers independently without AD.  Ambulated to gym, then complete 10 mins on nustep level 7 for cardiovascular endurance. Ambulated to standard chair and remained in therapy gym for upcoming group dance session with staff present for immediate needs. Water provided per request, all other needs met at conclusion of session.   Therapy Documentation Precautions:  Precautions Precautions: Fall Restrictions Weight Bearing Restrictions: No    Therapy/Group: Individual Therapy  Melvyn Novas, MS, OTR/L  10/16/2022, 3:14 PM

## 2022-10-16 NOTE — Progress Notes (Addendum)
Inpatient Rehabilitation Discharge Medication Review by a Pharmacist  A complete drug regimen review was completed for this patient to identify any potential clinically significant medication issues.  High Risk Drug Classes Is patient taking? Indication by Medication  Antipsychotic No   Anticoagulant No   Antibiotic No   Opioid No   Antiplatelet Yes Aspirin, clopidogrel - CVA  Hypoglycemics/insulin Yes Mounjaro - DM  Vasoactive Medication Yes Tamsulosin - BPH   Chemotherapy No   Other Yes Prolia - osteoporosis  Ezetimibe, atorvastatin - HLD Trazodone - sleep     Type of Medication Issue Identified Description of Issue Recommendation(s)  Drug Interaction(s) (clinically significant)     Duplicate Therapy     Allergy     No Medication Administration End Date     Incorrect Dose     Additional Drug Therapy Needed     Significant med changes from prior encounter (inform family/care partners about these prior to discharge).    Other       Clinically significant medication issues were identified that warrant physician communication and completion of prescribed/recommended actions by midnight of the next day:  No  Pharmacist comments: None  Time spent performing this drug regimen review (minutes):  20 minutes  Thank you for allowing pharmacy to be a part of this patient's care.   Signe Colt, PharmD 10/17/2022 10:11 AM  **Pharmacist phone directory can be found on [email or web address] listed under [facility]**

## 2022-10-16 NOTE — Discharge Summary (Signed)
Physician Discharge Summary  Patient ID: Kenneth [name] MRN: [id number] DOB/AGE: 74-Oct-1952 74 y.o.  Admit date: 10/10/2022 Discharge date: 10/17/2022  Discharge Diagnoses:  Principal Problem:   Stroke (cerebrum) [facility]) Active Problems:   Mixed hyperlipidemia   Diabetes (HCC)   Discharged Condition: {condition:18240}  Significant Diagnostic Studies: EP PPM/ICD IMPLANT  Result Date: 10/10/2022 CONCLUSIONS:  1. Successful implantation of a Medtronic Reveal LINQ implantable loop recorder for cryptogenic stroke  2. No early apparent complications. Lewayne Bunting, MD 10/10/2022 2:52 PM   VAS [place] LOWER EXTREMITY VENOUS (DVT)  Result Date: 10/09/2022  Lower Venous DVT Study Patient Name:  Kenneth [name]  Date of Exam:   10/09/2022 Medical Rec #: [id number]         Accession #:    [id number] Date of Birth: Dec 10, 1950         Patient Gender: M Patient Age:   20 years Exam Location:  [facility] Procedure:      VAS [place] LOWER EXTREMITY VENOUS (DVT) Referring Phys: Scheryl Marten XU --------------------------------------------------------------------------------  Indications: Stroke.  Comparison Study: No prior studies. Performing Technologist: Jean Rosenthal RDMS, RVT  Examination Guidelines: A complete evaluation includes B-mode imaging, spectral Doppler, color Doppler, and power Doppler as needed of all accessible portions of each vessel. Bilateral testing is considered an integral part of a complete examination. Limited examinations for reoccurring indications may be performed as noted. The reflux portion of the exam is performed with the patient in reverse Trendelenburg.  +---------+---------------+---------+-----------+----------+--------------+ RIGHT    CompressibilityPhasicitySpontaneityPropertiesThrombus Aging +---------+---------------+---------+-----------+----------+--------------+ CFV      Full           Yes      Yes                                  +---------+---------------+---------+-----------+----------+--------------+ SFJ      Full                                                        +---------+---------------+---------+-----------+----------+--------------+ FV Prox  Full                                                        +---------+---------------+---------+-----------+----------+--------------+ FV Mid   Full                                                        +---------+---------------+---------+-----------+----------+--------------+ FV DistalFull                                                        +---------+---------------+---------+-----------+----------+--------------+ PFV      Full                                                        +---------+---------------+---------+-----------+----------+--------------+  POP      Full           Yes      Yes                                 +---------+---------------+---------+-----------+----------+--------------+ PTV      Full                                                        +---------+---------------+---------+-----------+----------+--------------+ PERO     Full                                                        +---------+---------------+---------+-----------+----------+--------------+ Gastroc  Full                                                        +---------+---------------+---------+-----------+----------+--------------+   +---------+---------------+---------+-----------+----------+--------------+ LEFT     CompressibilityPhasicitySpontaneityPropertiesThrombus Aging +---------+---------------+---------+-----------+----------+--------------+ CFV      Full           Yes      Yes                                 +---------+---------------+---------+-----------+----------+--------------+ SFJ      Full                                                         +---------+---------------+---------+-----------+----------+--------------+ FV Prox  Full                                                        +---------+---------------+---------+-----------+----------+--------------+ FV Mid   Full                                                        +---------+---------------+---------+-----------+----------+--------------+ FV DistalFull                                                        +---------+---------------+---------+-----------+----------+--------------+ PFV      Full                                                        +---------+---------------+---------+-----------+----------+--------------+  POP      Full           Yes      Yes                                 +---------+---------------+---------+-----------+----------+--------------+ PTV      Full                                                        +---------+---------------+---------+-----------+----------+--------------+ PERO     Full                                                        +---------+---------------+---------+-----------+----------+--------------+ Gastroc  Full                                                        +---------+---------------+---------+-----------+----------+--------------+     Summary: RIGHT: - There is no evidence of deep vein thrombosis in the lower extremity.  - No cystic structure found in the popliteal fossa.  LEFT: - There is no evidence of deep vein thrombosis in the lower extremity.  - No cystic structure found in the popliteal fossa.  *See table(s) above for measurements and observations. Electronically signed by Heath Lark on 10/09/2022 at 5:38:22 PM.    Final    Labs:  Basic Metabolic Panel:    Latest Ref Rng & Units 10/13/2022    7:12 AM 10/09/2022    6:25 AM 10/08/2022    4:42 PM  BMP  Glucose 70 - 99 mg/dL 161  85  96   BUN 8 - 23 mg/dL 15  14  22    Creatinine 0.61 - 1.24 mg/dL 0.96  0.45  4.09    Sodium 135 - 145 mmol/L 136  138  137   Potassium 3.5 - 5.1 mmol/L 3.8  3.8  3.4   Chloride 98 - 111 mmol/L 102  102  100   CO2 22 - 32 mmol/L 26  25  28    Calcium 8.9 - 10.3 mg/dL 8.6  9.3  9.7      CBC:    Latest Ref Rng & Units 10/13/2022    7:12 AM 10/09/2022    6:25 AM 10/08/2022    4:42 PM  CBC  WBC 4.0 - 10.5 K/uL 7.6  8.5  9.3   Hemoglobin 13.0 - 17.0 g/dL 81.1  91.4  78.2   Hematocrit 39.0 - 52.0 % 36.6  37.7  37.9   Platelets 150 - 400 K/uL 174  172  180      CBG: Recent Labs  Lab 10/13/22 0601 10/13/22 1153 10/13/22 1628 10/13/22 2040 10/14/22 0535  GLUCAP 100* 106* 79 83 98    Brief HPI:   DODGE ATOR is a 72 y.o. male ***   Hospital Course: KASTEN LEVEQUE was admitted to rehab 10/10/2022 for inpatient therapies to consist of PT, ST and OT at least three hours five  days a week. Past admission physiatrist, therapy team and rehab RN have worked together to provide customized collaborative inpatient rehab.   Blood pressures were monitored on TID basis and   Diabetes has been monitored with ac/hs CBG checks and SSI was use prn for tighter BS control.    Rehab course: During patient's stay in rehab weekly team conferences were held to monitor patient's progress, set goals and discuss barriers to discharge. At admission, patient required  He  has had improvement in activity tolerance, balance, postural control as well as ability to compensate for deficits. He has had improvement in functional use RUE/LUE  and RLE/LLE as well as improvement in awareness. He is able to complete ADL tasks independently. He is independent for transfers and is able to ambulate 150' without AD. He is able to climb 12 stairs w/one rail independently.        Disposition: Home  Diet: Heart Healthy.   Special Instructions:  Discharge Instructions     Ambulatory referral to Neurology   Complete by: As directed    An appointment is requested in approximately: 4 weeks    Ambulatory referral to Physical Medicine Rehab   Complete by: As directed    TC appointment/return to driving      Allergies as of 10/17/2022       Reactions   [facility] due to complications   Penicillins    CHILDHOOD ALLERGY Has patient had a PCN reaction causing immediate rash, facial/tongue/throat swelling, SOB or lightheadedness with hypotension: Unknown Has patient had a PCN reaction causing severe rash involving mucus membranes or skin necrosis: Unknown Has patient had a PCN reaction that required hospitalization: Unknown Has patient had a PCN reaction occurring within the last 10 years: Unknown If all of the above answers are "NO", then may proceed with Cephalosporin use.        Medication List     STOP taking these medications    benazepril 40 MG tablet Commonly known as: LOTENSIN   buPROPion ER 100 MG 12 hr tablet Commonly known as: WELLBUTRIN SR   chlorthalidone 25 MG tablet Commonly known as: HYGROTON   metFORMIN 500 MG tablet Commonly known as: GLUCOPHAGE       TAKE these medications    acetaminophen 500 MG tablet Commonly known as: TYLENOL Take 500 mg by mouth daily as needed for headache.   aspirin 81 MG tablet Take 1 tablet (81 mg total) by mouth daily. Till 10/28/22. Then stop and continue Plavix alone for stroke prevention. Start taking on: October 18, 2022 What changed:  when to take this additional instructions   atorvastatin 40 MG tablet Commonly known as: LIPITOR Take 1 tablet (40 mg total) by mouth daily.   B-D UF III MINI PEN NEEDLES 31G X 5 MM Misc Generic drug: Insulin Pen Needle Inject 1 Stick as directed as directed.   clopidogrel 75 MG tablet Commonly known as: PLAVIX Take 1 tablet (75 mg total) by mouth daily.   ezetimibe 10 MG tablet Commonly known as: ZETIA Take 10 mg by mouth daily.   melatonin 3 MG Tabs tablet Take 1 tablet (3 mg total) by mouth at bedtime as needed.   Mounjaro 5 MG/0.5ML  Pen Generic drug: tirzepatide Inject 5 mg into the skin once a week.   Prolia 60 MG/ML Sosy injection Generic drug: denosumab Inject 60 mg into the skin every 6 (six) months.   tamsulosin 0.4 MG Caps capsule Commonly known as: FLOMAX  Take 0.4 mg by mouth daily. Patient states he is taking it twice daily   Toujeo SoloStar 300 UNIT/ML Solostar Pen Generic drug: insulin glargine (1 Unit Dial) Inject 30 Units into the skin daily. What changed: how much to take   traZODone 50 MG tablet Commonly known as: DESYREL Take 0.5 tablets (25 mg total) by mouth at bedtime as needed for sleep.   VITAMIN D PO Take 1 tablet by mouth 2 (two) times daily. Twice a day pr patient        Follow-up Information     [name], MD. Call.   Specialty: Internal Medicine Why: on Monday for post hospital follow up Contact information: [address] [place] [address] [phone]         Horton Chin, MD Follow up.   Specialty: Physical Medicine and Rehabilitation Why: office will call you with follow up appointment Contact information: [address] [place] [address] [phone]         [facility] Follow up.   Specialty: Neurology Why: office will call you with follow up appointment Contact information: [address] [place] [address] [phone]        Jake Bathe, MD Follow up.   Specialty: Cardiology Why: office will call you with follow up appointment Contact information: [address] [place] [address] [phone]                 Signed: Jacquelynn Cree 10/17/2022, 10:25 AM

## 2022-10-16 NOTE — Plan of Care (Signed)
  Problem: RH Balance Goal: LTG Patient will maintain dynamic standing with ADLs (OT) Description: LTG:  Patient will maintain dynamic standing balance with assist during activities of daily living (OT)  Outcome: Completed/Met   Problem: RH Grooming Goal: LTG Patient will perform grooming w/assist,cues/equip (OT) Description: LTG: Patient will perform grooming with assist, with/without cues using equipment (OT) Outcome: Completed/Met   Problem: RH Bathing Goal: LTG Patient will bathe all body parts with assist levels (OT) Description: LTG: Patient will bathe all body parts with assist levels (OT) Outcome: Completed/Met   Problem: RH Dressing Goal: LTG Patient will perform upper body dressing (OT) Description: LTG Patient will perform upper body dressing with assist, with/without cues (OT). Outcome: Completed/Met Goal: LTG Patient will perform lower body dressing w/assist (OT) Description: LTG: Patient will perform lower body dressing with assist, with/without cues in positioning using equipment (OT) Outcome: Completed/Met   Problem: RH Toileting Goal: LTG Patient will perform toileting task (3/3 steps) with assistance level (OT) Description: LTG: Patient will perform toileting task (3/3 steps) with assistance level (OT)  Outcome: Completed/Met   Problem: RH Toilet Transfers Goal: LTG Patient will perform toilet transfers w/assist (OT) Description: LTG: Patient will perform toilet transfers with assist, with/without cues using equipment (OT) Outcome: Completed/Met   Problem: RH Tub/Shower Transfers Goal: LTG Patient will perform tub/shower transfers w/assist (OT) Description: LTG: Patient will perform tub/shower transfers with assist, with/without cues using equipment (OT) Outcome: Completed/Met   

## 2022-10-16 NOTE — Progress Notes (Signed)
Attempted to see patient in gym and room today but could not locate him during morning rounds.

## 2022-10-16 NOTE — Progress Notes (Signed)
Physical Therapy Discharge Summary  Patient Details  Name: Kenneth [name] MRN: [id number] Date of Birth: 05/26/1950  Date of Discharge from PT service:October 16, 2022  Today's Date: 10/16/2022 PT Individual Time: 0731-0841 PT Individual Time Calculation (min): 70 min   Patient has met 9 of 9 long term goals due to improved activity tolerance, improved balance, improved postural control, increased strength, ability to compensate for deficits, improved awareness, and improved coordination. Patient to discharge at an ambulatory level Independent with and without SPC.   All goals met  Recommendation:  Patient will benefit from ongoing skilled PT services in outpatient setting to continue to advance safe functional mobility, address ongoing impairments in transfers, generalized strengthening and endurance, dynamic standing balance/coordination, gait training, and to minimize fall risk.  Equipment: SPC  Reasons for discharge: treatment goals met  Patient/family agrees with progress made and goals achieved: Yes  Today's Interventions: Received pt sitting in recliner, pt agreeable to PT treatment, and denied any pain during session. Session with emphasis on discharge planning, functional mobility/transfers, generalized strengthening and endurance, dynamic standing balance/coordination, NMR, gait training, stair navigation, and simulated car transfers. Went through sensation, MMT, and pain interference questionnaire and pt performed all transfers independently without AD throughout session. Pt ambulated >368ft without AD independently to ortho gym and performed ambulatory simulated car transfer without AD independently and ambulated 37ft on uneven surfaces (ramp and mulch) and descended 1 5in curb without AD independently. Ambulated 172ft without AD independently to stairwell and navigated 33 6in steps with R handrail and mod I ascending and descending with a step through pattern.  Pt able to stand  and pick up clothespin from floor without AD independently. Discussed fall recovery and educated pt on fall precautions and if injured, hit his head, or does not feel safe getting up, then to call 911 and avoid getting up. If pt feels like he is safe to get up then to roll onto side, get into quadruped, crawl to stable piece of furniture, place hands up for support, get feet underneath him, and pivot into seated position. Also discussed using life alert, apple watch, or keeping phone in pocket at all times upon discharge.   Pt performed BERG balance test and demonstrates increased fall risk as noted by score of 44/56 on Berg Balance Scale.  (<36= high risk for falls, close to 100%; 37-45 significant >80%; 46-51 moderate >50%; 52-55 lower >25%). Discussed importance of using SPC initially upon D/C and pt in agreement. Pt then performed the following activities inside // bars with emphasis on dynamic standing balance/coordination: -lateral taps on Rockerboard with BUE support fading to 1UE support for 1 minute x 2 trials -squats on Rockerboard with bilateral fingertip support 3x10 -lunges on Bosu 2x10 bilaterally  Pt then worked on Public relations account executive on Airex x30 seconds bilaterally with mod A overall for balance. Ambulated >28ft without AD independently back to room and concluded session with pt sitting in recliner with all needs met. Pt independent in room.   PT Discharge Precautions/Restrictions Precautions Precautions: Fall Restrictions Weight Bearing Restrictions: No Pain Interference Pain Interference Pain Effect on Sleep: 0. Does not apply - I have not had any pain or hurting in the past 5 days Pain Interference with Therapy Activities: 0. Does not apply - I have not received rehabilitationtherapy in the past 5 days Pain Interference with Day-to-Day Activities: 1. Rarely or not at all Cognition Overall Cognitive Status: Within Functional Limits for tasks assessed Arousal/Alertness:  Awake/alert Orientation Level: Oriented X4  Memory: Appears intact Awareness: Appears intact Problem Solving: Appears intact Safety/Judgment: Appears intact Sensation Sensation Light Touch: Appears Intact Hot/Cold: Appears Intact Proprioception: Appears Intact Stereognosis: Appears Intact Coordination Gross Motor Movements are Fluid and Coordinated: Yes Fine Motor Movements are Fluid and Coordinated: Yes Finger Nose Finger Test: WFL bilaterally Heel Shin Test: WFL bilaterally Motor  Motor Motor: Within Functional Limits  Mobility Bed Mobility Bed Mobility: Rolling Right;Rolling Left;Sit to Supine;Supine to Sit Rolling Right: Independent Rolling Left: Independent Supine to Sit: Independent Sit to Supine: Independent Transfers Transfers: Sit to Stand;Stand to Sit;Stand Pivot Transfers Sit to Stand: Independent Stand to Sit: Independent Stand Pivot Transfers: Independent Transfer (Assistive device): None Locomotion  Gait Ambulation: Yes Gait Assistance: Independent Gait Distance (Feet): 200 Feet Assistive device: None Gait Gait: Yes Gait Pattern: Impaired Gait Pattern: Decreased stride length;Narrow base of support Gait velocity: slightly decreased Stairs / Additional Locomotion Stairs: Yes Stairs Assistance: Independent with assistive device Stair Management Technique: One rail Right Number of Stairs: 33 Height of Stairs: 6 Ramp: Independent Curb: Independent Pick up small object from the floor assist level: Independent Pick up small object from the floor assistive device: clothespin without AD Wheelchair Mobility Wheelchair Mobility: No  Trunk/Postural Assessment  Cervical Assessment Cervical Assessment: Exceptions to [facility] (mild forward head) Thoracic Assessment Thoracic Assessment: Exceptions to [facility] (rounded shoulders) Lumbar Assessment Lumbar Assessment: Within Functional Limits Postural Control Postural Control: Within Functional Limits   Balance Balance Balance Assessed: Yes Standardized Balance Assessment Standardized Balance Assessment: Berg Balance Test Berg Balance Test Sit to Stand: Able to stand without using hands and stabilize independently Standing Unsupported: Able to stand safely 2 minutes Sitting with Back Unsupported but Feet Supported on Floor or Stool: Able to sit safely and securely 2 minutes Stand to Sit: Sits safely with minimal use of hands Transfers: Able to transfer safely, minor use of hands Standing Unsupported with Eyes Closed: Able to stand 10 seconds with supervision Standing Ubsupported with Feet Together: Able to place feet together independently and stand 1 minute safely From Standing, Reach Forward with Outstretched Arm: Can reach forward >12 cm safely (5") From Standing Position, Pick up Object from Floor: Able to pick up shoe safely and easily From Standing Position, Turn to Look Behind Over each Shoulder: Looks behind one side only/other side shows less weight shift Turn 360 Degrees: Able to turn 360 degrees safely but slowly Standing Unsupported, Alternately Place Feet on Step/Stool: Able to complete 4 steps without aid or supervision Standing Unsupported, One Foot in Front: Able to take small step independently and hold 30 seconds Standing on One Leg: Tries to lift leg/unable to hold 3 seconds but remains standing independently Total Score: 44 Static Sitting Balance Static Sitting - Balance Support: Feet supported;No upper extremity supported Static Sitting - Level of Assistance: 7: Independent Dynamic Sitting Balance Dynamic Sitting - Balance Support: Feet supported;No upper extremity supported Dynamic Sitting - Level of Assistance: 7: Independent Static Standing Balance Static Standing - Balance Support: No upper extremity supported;During functional activity Static Standing - Level of Assistance: 7: Independent Dynamic Standing Balance Dynamic Standing - Balance Support: No upper  extremity supported;During functional activity Dynamic Standing - Level of Assistance: 7: Independent Dynamic Standing - Comments: with transfers and gait Extremity Assessment  RLE Assessment RLE Assessment: Within Functional Limits General Strength Comments: 5/5 LLE Assessment LLE Assessment: Within Functional Limits General Strength Comments: 5/5   Marlana Salvage Zaunegger Blima Rich PT, DPT 10/16/2022, 7:00 AM

## 2022-10-16 NOTE — Progress Notes (Signed)
Patient ID: Kenneth [name], male   DOB: 1950-05-23, 72 y.o.   MRN: [id number]   OP referral sent to [facility] only.

## 2022-10-16 NOTE — Progress Notes (Signed)
Inpatient Rehabilitation Care Coordinator Discharge Note   Patient Details  Name: Kenneth [name] MRN: [id number] Date of Birth: [birth year]   Discharge location: Home  Length of Stay: 7 days  Discharge activity level: Mod I  Home/community participation: n/a  Patient response WU:[id number] Literacy - How often do you need to have someone help you when you read instructions, pamphlets, or other written material from your doctor or pharmacy?: Never  Patient response YN:WGNFAO Isolation - How often do you feel lonely or isolated from those around you?: Never  Services provided included: PT, RD, MD, OT, SLP, RN, CM, TR, Pharmacy, Neuropsych, SW  Financial Services:  Financial Services Utilized: Medicare    Choices offered to/list presented to: Patient  Follow-up services arranged:  Outpatient    Outpatient Servicies: OP at [facility]      Patient response to transportation need: Is the patient able to respond to transportation needs?: Yes In the past 12 months, has lack of transportation kept you from medical appointments or from getting medications?: No In the past 12 months, has lack of transportation kept you from meetings, work, or from getting things needed for daily living?: No   Patient/Family verbalized understanding of follow-up arrangements:  Yes  Individual responsible for coordination of the follow-up plan: self  Confirmed correct DME delivered: Andria Rhein 10/16/2022    Comments (or additional information):  Summary of Stay    Date/Time Discharge Planning CSW  10/14/22 1426 Patient will need to reach MOD I goals due to limited/no caregiver at discharge. Pt's spouse is currently admitted to [facility] (since April). Patient has support from his sister, [name]. Patient lives in a 2 level home with 2 steps to enter, patient expressed he is able to maintain on the main level. CJB  10/14/22 1425 Anticipating MOD I goals. Discharging home with rotational  PRN supervision from sister and daughter. Daughter currenly runs a summer camp. CJB       Andria Rhein

## 2022-10-16 NOTE — Group Note (Signed)
Patient Details Name: Kenneth [name] MRN: [id number] DOB: 1950/11/18 Today's Date: 10/16/2022  Time Calculation: OT Group Time Calculation OT Group Start Time: 1430 OT Group Stop Time: 1530 OT Group Time Calculation (min): 60 min      Group Description: Dance Group: Pt participated in dance group with an emphasis on social interaction, motor planning, increasing overall activity tolerance and bimanual tasks. All songs were selected by group members. Dance moves included AROM of BUE/BLE gross motor movements with an emphasis on building functional endurance.    Individual level documentation: Patient completed group from sitting level overall, however Pt intermittently stood for increased balance and endurance challenge.  Patientt needed supervision to complete various dance moves with cues for body mechanics and technique.  Patient able to complete without modifications during group.  Pain:  0/10  Precautions:  Falls  Kaiyah Eber A Iveth Heidemann 10/16/2022, 3:52 PM

## 2022-10-16 NOTE — Progress Notes (Signed)
Occupational Therapy Discharge Summary  Patient Details  Name: MEDARDO BEDIENT MRN: [id number] Date of Birth: [birth year]  Date of Discharge from OT service:October 16, 2022  Patient has met 8 of 8 long term goals due to improved activity tolerance, improved balance, functional use of  RIGHT lower and LEFT lower extremity, and improved coordination.  Patient to discharge at overall Independent level.  Patient plans to have family/friend stay with him for a short period as he transitions home per preference, but is at independent level and has been mod I in his hospital room without concern or safety incidents.    All goals met  Recommendation:  Patient will benefit from ongoing skilled OT services in outpatient setting to continue to advance functional skills in the area of iADL and return to work .  Equipment: No equipment provided  Reasons for discharge: treatment goals met  Patient/family agrees with progress made and goals achieved: Yes  OT Discharge Precautions/Restrictions  Precautions Precautions: Fall Restrictions Weight Bearing Restrictions: No ADL ADL Eating: Independent Where Assessed-Eating: Chair Grooming: Independent Where Assessed-Grooming: Standing at sink Upper Body Bathing: Independent Where Assessed-Upper Body Bathing: Shower Lower Body Bathing: Modified independent Where Assessed-Lower Body Bathing: Shower Upper Body Dressing: Independent Where Assessed-Upper Body Dressing: Edge of bed Lower Body Dressing: Independent Where Assessed-Lower Body Dressing: Edge of bed Toileting: Independent Where Assessed-Toileting: Teacher, adult education: Community education officer Method: Proofreader: Grab bars, Other (comment) (with and without SPC) Tub/Shower Transfer: Not assessed Film/video editor: Psychologist, counselling Method: Designer, industrial/product: Sales promotion account executive Baseline Vision/History: 1  Wears glasses Patient Visual Report: No change from baseline Vision Assessment?: No apparent visual deficits Perception  Perception: Within Functional Limits Praxis Praxis: Intact Cognition Cognition Overall Cognitive Status: Within Functional Limits for tasks assessed Arousal/Alertness: Awake/alert Orientation Level: Place;Situation;Person Person: Oriented Place: Oriented Situation: Oriented Memory: Appears intact Awareness: Appears intact Problem Solving: Appears intact Safety/Judgment: Appears intact Brief Interview for Mental Status (BIMS) Repetition of Three Words (First Attempt): 3 Temporal Orientation: Year: Correct Temporal Orientation: Month: Accurate within 5 days Temporal Orientation: Day: Correct Recall: "Sock": Yes, no cue required Recall: "Blue": Yes, no cue required Recall: "Bed": Yes, no cue required BIMS Summary Score: 15 Sensation Sensation Light Touch: Appears Intact Hot/Cold: Appears Intact Proprioception: Appears Intact Stereognosis: Appears Intact Coordination Gross Motor Movements are Fluid and Coordinated: Yes Fine Motor Movements are Fluid and Coordinated: Yes Finger Nose Finger Test: WFL bilaterally Heel Shin Test: [facility] bilaterally Motor  Motor Motor: Within Functional Limits Mobility  Bed Mobility Bed Mobility: Rolling Right;Rolling Left;Sit to Supine;Supine to Sit Rolling Right: Independent Rolling Left: Independent Supine to Sit: Independent Sit to Supine: Independent Transfers Sit to Stand: Independent Stand to Sit: Independent  Trunk/Postural Assessment  Cervical Assessment Cervical Assessment: Exceptions to [facility] (mild forward head) Thoracic Assessment Thoracic Assessment: Exceptions to [facility] (rounded shoulders) Lumbar Assessment Lumbar Assessment: Within Functional Limits Postural Control Postural Control: Within Functional Limits  Balance Balance Balance Assessed: Yes Static Sitting Balance Static Sitting - Balance Support:  Feet supported;No upper extremity supported Static Sitting - Level of Assistance: 7: Independent Dynamic Sitting Balance Dynamic Sitting - Balance Support: Feet supported;No upper extremity supported Dynamic Sitting - Level of Assistance: 7: Independent Static Standing Balance Static Standing - Balance Support: No upper extremity supported;During functional activity Static Standing - Level of Assistance: 7: Independent Dynamic Standing Balance Dynamic Standing - Balance Support: No upper extremity supported;During functional activity Dynamic Standing - Level of Assistance: 7:  Independent Dynamic Standing - Comments: with transfers and gait Extremity/Trunk Assessment RUE Assessment RUE Assessment: Within Functional Limits LUE Assessment LUE Assessment: Within Functional Limits   Nyra Anspaugh E Enrika Aguado, MS, OTR/L  10/16/2022, 3:15 PM

## 2022-10-16 NOTE — Discharge Instructions (Addendum)
Inpatient Rehab Discharge Instructions  Kenneth [name] Discharge date and time: 10/17/22   Activities/Precautions/ Functional Status: Activity: no lifting, driving, or strenuous exercise  till cleared by MD Diet: cardiac diet and diabetic diet Wound Care: keep wound clean and dry   Functional status:  ___ No restrictions     ___ Walk up steps independently ___ 24/7 supervision/assistance   ___ Walk up steps with assistance _X__ Intermittent supervision/assistance  ___ Bathe/dress independently _X__ Walk with cane for longer distances.   ___ Bathe/dress with assistance ___ Walk Independently    ___ Shower independently ___ Walk with assistance    ___ Shower with assistance _X__ No alcohol     ___ Return to work/school ________  COMMUNITY REFERRALS UPON DISCHARGE:     Outpatient: PT                  Agency: Outpatient at [facility] Phone: [phone]              Appointment Date/Time:TBD  Medical Equipment/Items Ordered: Single Point [place]                                                  Agency/Supplier: [employer] [phone]  Special Instructions: You blood sugars have been well controlled without any diabetic medications while in the hospital. Monitor your blood sugars at home and follow up with Dr. Waynard Edwards  for input on resumption of your medications.  2. Continue aspirin for 10 days then stop. You will continue Plavix alone.     STROKE/TIA DISCHARGE INSTRUCTIONS SMOKING Cigarette smoking nearly doubles your risk of having a stroke & is the single most alterable risk factor  If you smoke or have smoked in the last 12 months, you are advised to quit smoking for your health. Most of the excess cardiovascular risk related to smoking disappears within a year of stopping. Ask you doctor about anti-smoking medications [place] Quit Line: 1-800-QUIT NOW Free Smoking Cessation Classes [phone]  CHOLESTEROL Know your levels; limit fat & cholesterol in your diet  Lipid Panel      Component Value Date/Time   CHOL 80 10/09/2022 0625   CHOL 159 02/10/2017 1307   TRIG 85 10/09/2022 0625   HDL 18 (L) 10/09/2022 0625   HDL 67 02/10/2017 1307   CHOLHDL 4.4 10/09/2022 0625   VLDL 17 10/09/2022 0625   LDLCALC 45 10/09/2022 0625   LDLCALC 58 02/10/2017 1307     Many patients benefit from treatment even if their cholesterol is at goal. Goal: Total Cholesterol (CHOL) less than 160 Goal:  Triglycerides (TRIG) less than 150 Goal:  HDL greater than 40 Goal:  LDL (LDLCALC) less than 100   BLOOD PRESSURE American Stroke Association blood pressure target is less that 120/80 mm/Hg  Your discharge blood pressure is:  BP: 130/64 Monitor your blood pressure Limit your salt and alcohol intake Many individuals will require more than one medication for high blood pressure  DIABETES (A1c is a blood sugar average for last 3 months) Goal HGBA1c is under 7% (HBGA1c is blood sugar average for last 3 months)  Diabetes:     Lab Results  Component Value Date   HGBA1C 5.6 10/09/2022    Your HGBA1c can be lowered with medications, healthy diet, and exercise. Check your blood sugar as directed by your physician Call your physician if you  experience unexplained or low blood sugars.  PHYSICAL ACTIVITY/REHABILITATION Goal is 30 minutes at least 4 days per week  Activity: No driving, Therapies: see above Return to work: to be decided after follow up.  Activity decreases your risk of heart attack and stroke and makes your heart stronger.  It helps control your weight and blood pressure; helps you relax and can improve your mood. Participate in a regular exercise program. Talk with your doctor about the best form of exercise for you (dancing, walking, swimming, cycling).  DIET/WEIGHT Goal is to maintain a healthy weight  Your discharge diet is:  Diet Order             Diet heart healthy/carb modified Room service appropriate? Yes; Fluid consistency: Thin  Diet effective now                    liquids Your height is:  Height: 5\' 10"  (177.8 cm) Your current weight is: 211 lbs Your Body Mass Index (BMI) is:  BMI (Calculated): 30.34 Following the type of diet specifically designed for you will help prevent another stroke. Your goal weight range is:  174 lbs Your goal Body Mass Index (BMI) is 19-24. Healthy food habits can help reduce 3 risk factors for stroke:  High cholesterol, hypertension, and excess weight.  RESOURCES Stroke/Support Group:  Call [phone]   STROKE EDUCATION PROVIDED/REVIEWED AND GIVEN TO PATIENT Stroke warning signs and symptoms How to activate emergency medical system (call 911). Medications prescribed at discharge. Need for follow-up after discharge. Personal risk factors for stroke. Pneumonia vaccine given:  Flu vaccine given:  My questions have been answered, the writing is legible, and I understand these instructions.  I will adhere to these goals & educational materials that have been provided to me after my discharge from the hospital.     My questions have been answered and I understand these instructions. I will adhere to these goals and the provided educational materials after my discharge from the hospital.  Patient/Caregiver Signature _______________________________ Date __________  Clinician Signature _______________________________________ Date __________  Please bring this form and your medication list with you to all your follow-up doctor's appointments.

## 2022-10-16 NOTE — Progress Notes (Signed)
Occupational Therapy Session Note  Patient Details  Name: Kenneth [name] MRN: [id number] Date of Birth: [birth year]  Today's Date: 10/16/2022 OT Individual Time: 0912-1000 OT Individual Time Calculation (min): 48 min  and Today's Date: 10/16/2022 OT Missed Time: 12 Minutes Missed Time Reason: Other (comment) (OT late arrival due to previous session)   Short Term Goals: Week 1:  OT Short Term Goal 1 (Week 1): STG=LTG d/t ELOS  Skilled Therapeutic Interventions/Progress Updates:  Skilled OT intervention completed with focus on ambulatory endurance, CVA education, higher level fine motor and memory skills with pt's hobby of piano playing. Pt received seated in recliner, agreeable to session. No pain reported. OT arrived late to session due to previous therapy session, therefore missed 12 mins of OT intervention; will attempt to make up as able.  Pt politely declined self-care needs. Pt expressed his leisure activity of playing the piano therefore agreeable to go to gym for keyboard/piano playing.  Completed all sit > stands and ambulatory transfers for >1000 ft without AD, independently. No LOB throughout entirety of session.  In gym, pt remained standing for 2 songs on keyboard to incorporate pt's leisure activity into higher level fine motor coordination and memory/sequencing (pt playing songs by memory).  Ambulated down to 1st floor at location of piano in hospital (piano is more what pt is used to). Was able to play 5 songs by memory, with use of his auditory hearing device (incredible!!). No difficulty in OT eyes of pt playing however pt expressed notable difference since the stroke. Discussed how he has great motor/sensory abilities, but that higher level thinking, memory and [facility] may be what he is experiencing as his "deficit." Emotional support offered to pt as he expressed what he is learning about his life now post CVA.  Ambulated to outside courtyard on paved/uneven surfaces; no LOB.  Stood at reflection fountain for continued discussion on CVA recovery and ways to continually progress. Ambulated back to room, where pt remained seated in recliner with all needs in reach at end of session.   Therapy Documentation Precautions:  Precautions Precautions: Fall Restrictions Weight Bearing Restrictions: No    Therapy/Group: Individual Therapy  Melvyn Novas, MS, OTR/L  10/16/2022, 12:15 PM

## 2022-10-16 NOTE — Progress Notes (Signed)
Patient ID: Kenneth [name], male   DOB: 1950/08/23, 72 y.o.   MRN: [id number]   Single point cane ordered through [employer].

## 2022-10-17 ENCOUNTER — Ambulatory Visit (HOSPITAL_COMMUNITY): Payer: Self-pay | Admitting: Psychology

## 2022-10-17 DIAGNOSIS — E782 Mixed hyperlipidemia: Secondary | ICD-10-CM

## 2022-10-17 DIAGNOSIS — E119 Type 2 diabetes mellitus without complications: Secondary | ICD-10-CM

## 2022-10-17 DIAGNOSIS — I639 Cerebral infarction, unspecified: Secondary | ICD-10-CM

## 2022-10-17 DIAGNOSIS — E1169 Type 2 diabetes mellitus with other specified complication: Secondary | ICD-10-CM

## 2022-10-17 MED ORDER — TOUJEO SOLOSTAR 300 UNIT/ML ~~LOC~~ SOPN: 30.0000 [IU] | PEN_INJECTOR | Freq: Every day | SUBCUTANEOUS | Status: DC

## 2022-10-17 MED ORDER — ASPIRIN 81 MG PO TABS: 81.0000 mg | ORAL_TABLET | Freq: Every day | ORAL | Status: DC

## 2022-10-17 MED ORDER — CLOPIDOGREL BISULFATE 75 MG PO TABS: 75.0000 mg | ORAL_TABLET | Freq: Every day | ORAL | 0 refills | Status: DC

## 2022-10-17 MED ORDER — ATORVASTATIN CALCIUM 40 MG PO TABS: 40.0000 mg | ORAL_TABLET | Freq: Every day | ORAL | 0 refills | Status: DC

## 2022-10-17 MED ORDER — MELATONIN 3 MG PO TABS: 3.0000 mg | ORAL_TABLET | Freq: Every evening | ORAL | 0 refills | Status: DC | PRN

## 2022-10-17 MED ORDER — TRAZODONE HCL 50 MG PO TABS: 25.0000 mg | ORAL_TABLET | Freq: Every evening | ORAL | 0 refills | Status: DC | PRN

## 2022-10-17 NOTE — Progress Notes (Signed)
PROGRESS NOTE   Subjective/Complaints: No new complaints this morning Very appreciative of care here Denies pain Friends are picking him up today  ROS: as per HPI. Denies CP, SOB, Abd pain/n/v/d/c, +stress   Objective:   No results found. No results for input(s): "WBC", "HGB", "HCT", "PLT" in the last 72 hours.   No results for input(s): "NA", "K", "CL", "CO2", "GLUCOSE", "BUN", "CREATININE", "CALCIUM" in the last 72 hours.    Intake/Output Summary (Last 24 hours) at 10/17/2022 0955 Last data filed at 10/16/2022 1835 Gross per 24 hour  Intake 471 ml  Output --  Net 471 ml        Physical Exam: Vital Signs Blood pressure 131/63, pulse (!) 58, temperature (!) 97.5 F (36.4 C), temperature source Oral, resp. rate 18, height 5\' 10"  (1.778 m), weight 95.9 kg, SpO2 94 %.  Constitutional: No apparent distress. Appropriate appearance for age. Sitting in w/c HENT: No JVD. Neck Supple. Trachea midline. Atraumatic, normocephalic. +Bilateral hearing aides, HOH Cardio: Bradycardia Chest: normal effort, normal rate of breathing Abd: soft, non-distended Ext: no edema Psych: pleasant, normal affect Skin: intact  PRIOR EXAMS: MSK:      No apparent deformity.      Strength:                RUE: 5-/5 SA, 5/5 EF, 5/5 EE, 5/5 WE, 5/5 FF, 5/5 FA                 LUE: 5-/5 SA, 5-/5 EF, 5-/5 EE, 5/5 WE, 5/5 FF, 5/5 FA                 RLE: 5/5 HF, 5/5 KE, 5/5 DF, 5/5 EHL, 5/5 PF                 LLE:  5/5 HF, 5/5 KE, 5/5 DF, 5/5 EHL, 5/5 PF    Neurologic exam:  Cognition: AAO to person, place, time and event.  Language: Fluent, No substitutions or neoglisms. Mild speech impediment baseline per patient. Names 3/3 objects correctly Memory: Recalls 3/3 objects at 5 minutes. No apparent deficits  Insight: Good insight into current condition.  Mood: Pleasant affect, appropriate mood.  Sensation: To light touch intact in BL UEs and LEs   Reflexes: 2+ in BL UE and LEs. Negative Hoffman's and babinski signs bilaterally.  CN: 2-12 grossly intact.  Coordination: Mild L>R UE ataxia; no LE ataxia. Right truncal drift with eyes closed. Improving balance Spasticity: MAS 0 in all extremities.  Gait: Not observed Psych: motivated and pleasant  Assessment/Plan: 1. Functional deficits which require 3+ hours per day of interdisciplinary therapy in a comprehensive inpatient rehab setting. Physiatrist is providing close team supervision and 24 hour management of active medical problems listed below. Physiatrist and rehab team continue to assess barriers to discharge/monitor patient progress toward functional and medical goals  Care Tool:  Bathing    Body parts bathed by patient: Right arm, Left arm, Chest, Abdomen, Front perineal area, Buttocks, Right upper leg, Left upper leg, Right lower leg, Left lower leg, Face         Bathing assist Assist Level: Independent with assistive device  Upper Body Dressing/Undressing Upper body dressing   What is the patient wearing?: Pull over shirt    Upper body assist Assist Level: Independent    Lower Body Dressing/Undressing Lower body dressing      What is the patient wearing?: Pants, Underwear/pull up     Lower body assist Assist for lower body dressing: Independent with assitive device     Toileting Toileting    Toileting assist Assist for toileting: Independent     Transfers Chair/bed transfer  Transfers assist     Chair/bed transfer assist level: Independent     Locomotion Ambulation   Ambulation assist      Assist level: Independent Assistive device: No Device Max distance: 119ft   Walk 10 feet activity   Assist     Assist level: Independent Assistive device: No Device   Walk 50 feet activity   Assist    Assist level: Independent Assistive device: No Device    Walk 150 feet activity   Assist    Assist level:  Independent Assistive device: No Device    Walk 10 feet on uneven surface  activity   Assist     Assist level: Independent Assistive device: Other (comment) (no device)   Wheelchair     Assist Is the patient using a wheelchair?: No Type of Wheelchair: Manual Wheelchair activity did not occur: N/A  Wheelchair assist level: Dependent - Patient 0% Max wheelchair distance: 150'    Wheelchair 50 feet with 2 turns activity    Assist    Wheelchair 50 feet with 2 turns activity did not occur: N/A   Assist Level: Dependent - Patient 0%   Wheelchair 150 feet activity     Assist  Wheelchair 150 feet activity did not occur: N/A   Assist Level: Dependent - Patient 0%   Blood pressure 131/63, pulse (!) 58, temperature (!) 97.5 F (36.4 C), temperature source Oral, resp. rate 18, height 5\' 10"  (1.778 m), weight 95.9 kg, SpO2 94 %.  Medical Problem List and Plan: 1. 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  DVT prophylaxis: Discussed potential interactions between Lovenox 40mg  QD and aspirin but that Lovenox is recommended for clot prevention             -antiplatelet therapy: DAPT X 3 weeks followed by Plavix alone.   3. Pain Management:  N/A. Tylenol prn available.   4. Insomnia: continue Trazodone 25-50mg  PRN and melatonin 3mg  prn for insomnia.              -antipsychotic agents: N/A  5. Neuropsych/cognition: This patient is capable of making decisions on his own behalf. 6. Skin/Wound Care: Routine pressure relief measures.  7. Fluids/Electrolytes/Nutrition: Monitor I/O. Appetite  low likely due to [facility] but likes it that way.             --Check CMET on 06/03.  8. T2DM: Hgb A1C-5.6 and well controlled. Was on metformin, Monjaro and Insulin glargine. Tends to skip meals.              --  Continue to hold metformin. Monjaro not on formulary. Semglee 20U QD D/c ISS Discussed that he can bring Trajeo from home  Discussed that he can bring his Freestyle Libre from home Decrease the blood sugars to [facility] D/c semglee since started Trajeo D/ced CBGs and discussed with nursing that he may use Dexcom  CBG (last 3)  No results for input(s): "GLUCAP" in the last 72 hours.   9. HTN: Monitor BP TID.  Intermittent bradycardia HR in high 50's.  --Continue hold Benazepril and [place] as BP controlled off meds. Continue flomax  Vitals:   10/13/22 1436 10/13/22 1936 10/14/22 0556 10/14/22 1249  BP: 113/64 (!) 143/76 132/63 (!) 123/58   10/14/22 2043 10/15/22 0636 10/15/22 1256 10/15/22 2001  BP: (!) 126/52 121/61 111/70 (!) 132/50   10/16/22 0610 10/16/22 1838 10/16/22 2016 10/17/22 0549  BP: 130/64 (!) 116/47 (!) 126/57 131/63    10. Dyslipidemia Hx: Will decrease Lipitor to home dose of 40 mg. LDL-45 HDL- 18 (lower despite statin). Continue zetia 10mg  QD 11. OSA: Has declined CPAP 12. Prostate CA: Continue Flomax 0.4mg  QD 13. Anemia/Hx of iron deficiency: Will check anemia panel Monday 10/13/22.  --H/H stable in 12 range.Monitor CBGs outpatient.   14. Decreased TIBC: discussed low TIBC but normal iron levels. Discussed that patient is asymptomatic so we decided not to start a supplement, monitor outpatient if patient develops fatigue  15. Suboptimal potassium: supplement klor on 6/3, monitor outpatient    16. Stress: placed chaplain consult, consulted neuropsych   >30 minutes spent in discharge of patient including review of medications and follow-up appointments, physical examination, and in answering all patient's questions   LOS: 7 days A FACE TO FACE EVALUATION  WAS PERFORMED  Clint Bolder P Cailin Gebel 10/17/2022, 9:55 AM

## 2022-10-17 NOTE — Progress Notes (Signed)
INPATIENT REHABILITATION DISCHARGE NOTE   Discharge instructions by:Pam PA  Verbalized understanding:yes  Skin care/Wound care healing? none  Pain:none  IV's:none  Tubes/Drains:none  O2:none  Safety instructions:none  Patient belongings:done  Discharged ZO:XWRU  Discharged via:car  Notes:friends in room to take patient home.

## 2022-10-17 NOTE — Telephone Encounter (Signed)
[facility] for f/u call post loop implant.

## 2022-10-20 NOTE — Telephone Encounter (Signed)
Attempted call back. No answer, LMTCB.

## 2022-10-20 NOTE — Telephone Encounter (Signed)
Pt returning nurse call again.

## 2022-10-21 ENCOUNTER — Ambulatory Visit: Payer: Medicare Other | Attending: Physician Assistant

## 2022-10-21 DIAGNOSIS — R2681 Unsteadiness on feet: Secondary | ICD-10-CM | POA: Insufficient documentation

## 2022-10-21 DIAGNOSIS — R262 Difficulty in walking, not elsewhere classified: Secondary | ICD-10-CM | POA: Diagnosis not present

## 2022-10-21 DIAGNOSIS — M6281 Muscle weakness (generalized): Secondary | ICD-10-CM | POA: Insufficient documentation

## 2022-10-21 DIAGNOSIS — R2689 Other abnormalities of gait and mobility: Secondary | ICD-10-CM | POA: Diagnosis not present

## 2022-10-21 NOTE — Telephone Encounter (Signed)
  Loop Recorder Follow up   Is patient connected to [employer]? Yes   Have steri-strips fallen off or been removed? No   Does the patient need in office follow up? Yes   Please continue to monitor your cardiac device site for redness, swelling, and drainage. Call the device clinic at [phone] if you experience these symptoms, fever/chills, or have questions about your device.   Remote monitoring is used to monitor your cardiac device from home. This monitoring is scheduled every month by our office. It allows us to keep an eye on the functioning of your device to ensure it is working properly.  

## 2022-10-21 NOTE — Therapy (Signed)
OUTPATIENT PHYSICAL THERAPY NEURO EVALUATION   Patient Name: Kenneth [name] MRN: [id number] DOB:[birth year], 72 y.o., male Today's Date: 10/21/2022   PCP: [name], MD REFERRING PROVIDER: Charlton Amor, PA-C  END OF SESSION:  PT End of Session - 10/21/22 1312     Visit Number 1    Number of Visits 4    Date for PT Re-Evaluation 11/18/22    Authorization Type Medicare/Medicaid    PT Start Time 1315    PT Stop Time 1400    PT Time Calculation (min) 45 min             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   History of colon polyps - adenomas 04/03/2010   Hyperlipidemia    Hypertension    Iron deficiency anemia, unspecified    OSA (obstructive sleep apnea)    refusing CPAP   Pneumonia 03/2022   w/effusion   Prostate cancer [facility])    Past Surgical History:  Procedure Laterality Date   ANTERIOR CERVICAL DECOMP/DISCECTOMY FUSION N/A 10/01/2012   Procedure: ANTERIOR CERVICAL DECOMPRESSION/DISCECTOMY FUSION 2 LEVELS;  Surgeon: Tia Alert, MD;  Location: [facility];  Service: Neurosurgery;  Laterality: N/A;  Cervical five-six,Cervical six-seven   COLONOSCOPY  09/30/2010, 03/01/2014   diverticulosis, small internal hemorrhoids   COLONOSCOPY W/ POLYPECTOMY  04/03/2010   4 polyps 8-38mm, worst TV adenoma with high-grade dysplasia   LOOP RECORDER INSERTION N/A 10/10/2022   Procedure: LOOP RECORDER INSERTION;  Surgeon: Marinus Maw, MD;  Location: [facility];  Service: Cardiovascular;  Laterality: N/A;   NO PAST SURGERIES     POLYPECTOMY     POSTERIOR CERVICAL FUSION/FORAMINOTOMY Left 10/20/2012   Procedure: C/5-6,C/6-7 Lami/Multi level,POSTERIOR CERVICAL FUSION/FORAMINOTOMY C/4-7;  Surgeon: Tia Alert, MD;  Location: [facility];  Service: Neurosurgery;   Laterality: Left;  Cervical Five-Six, Cervical Six-Seven Laminectomies, Posterior Cervical Fusion/Foraminotomies Cervical Four through Seven.    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   Syncope 07/23/2017   Leukocytosis 07/23/2017   Hypokalemia 07/23/2017   UTI (urinary tract infection) 07/23/2017   Controlled type 2 diabetes mellitus without complication, without long-term current use of insulin (HCC) 02/10/2017   Mixed hyperlipidemia 02/10/2017   Hearing loss 02/10/2017   OSA (obstructive sleep apnea) 02/10/2017   History of colon polyps - adenomas 04/03/2010    ONSET DATE: 10/08/22  REFERRING DIAG:  I63.9 (ICD-10-CM) - Cerebral infarction, unspecified    THERAPY DIAG:  Difficulty in walking, not elsewhere classified  Muscle weakness (generalized)  Other abnormalities of gait and mobility  Unsteadiness on feet  Rationale for Evaluation and Treatment: Rehabilitation  SUBJECTIVE:  SUBJECTIVE STATEMENT: Had CVA on 10/08/22.  Presently RLE is still limited and causing difficulty with driving.  Denies any sensory disturbance to RLE but notes that the strength and power is still limited.  Been using cane periodically for steadying.  Pt reports his spouse is hospitalized presently but notes he will not likely have to provide caregiver services.  Pt reports he is independent in ADL and self-care and able to carry bag of groceries Pt  works as IT trainer and enjoys piano.  Pt accompanied by: family member  PERTINENT HISTORY: 72 y.o. male with history of T2DM, prostate cancer, congenital hearing loss, HTN who was admitted on 10/08/2022 after awakening in a.m. with weakness LLE greater than RLE as well as difficulty walking.  MRI brain done revealing acute bilateral ACA territory infarcts left greater than right.  PAIN:  Are you having pain? No  PRECAUTIONS: None  WEIGHT BEARING RESTRICTIONS: No  FALLS: Has patient fallen in last 6 months? Yes. Number of falls 1, day of stroke  LIVING ENVIRONMENT: Lives with: lives with their family Lives in: House/apartment Stairs: Yes: Internal: 1-2 steps; none Has following equipment at home: Single point cane  PLOF: Independent  PATIENT GOALS: 1) be able to drive again. 2) not to get so stressed  OBJECTIVE:   DIAGNOSTIC FINDINGS:  IMPRESSION: 1. No intracranial large vessel occlusion. Asymmetrically narrow right A3, which could represent stenosis versus asymmetric branching. 2. No hemodynamically significant stenosis in the neck.  IMPRESSION: Acute bilateral ACA territory infarcts (left-greater-than-right) involving the parasagittal aspects of the bilateral parietal lobes. No hemorrhage.  COGNITION: Overall cognitive status: Within functional limits for tasks assessed   SENSATION: WFL  COORDINATION: Able to perform heel to shin Able to perform rapid alternating movements   EDEMA:  BLE edema (chronic)  MUSCLE TONE:   MUSCLE LENGTH: WNL   POSTURE: No Significant postural limitations  LOWER EXTREMITY ROM:     WNL  LOWER EXTREMITY MMT:    MMT Right Eval Left Eval  Hip flexion 4 5  Hip extension    Hip abduction 3+ 5  Hip adduction    Hip internal rotation    Hip external rotation    Knee flexion 4 5  Knee extension 5 5  Ankle dorsiflexion 5 5  Ankle plantarflexion    Ankle inversion    Ankle eversion    (Blank rows = not tested)  BED  MOBILITY:  independent  TRANSFERS: Assistive device utilized: None  Sit to stand: Complete Independence Stand to sit: Complete Independence Chair to chair: Complete Independence Floor:  NT    CURB:  Level of Assistance: Complete Independence Assistive device utilized: None Curb Comments:   STAIRS: NT  GAIT: Gait pattern: WFL and decreased stride length Distance walked:  Assistive device utilized: None Level of assistance: Complete Independence Comments:   FUNCTIONAL TESTS:  5 times sit to stand: 13 sec Timed up and go (TUG): 11.5 sec and 11.5 sec TUG cog Berg Balance Scale: 48/56  M-CTSIB  Condition 1: Firm Surface, EO 30 Sec, Mild Sway  Condition 2: Firm Surface, EC 10 Sec, Moderate Sway  Condition 3: Foam Surface, EO 30 Sec, Mild Sway  Condition 4: Foam Surface, EC 10 Sec, Severe Sway       TODAY'S TREATMENT:  DATE: 10/21/22    PATIENT EDUCATION: Education details: assessment details/findings, rationale of PT intervention Person educated: Patient Education method: Explanation Education comprehension: verbalized understanding and needs further education  HOME EXERCISE PROGRAM: Access Code: [email or web address] URL: [email or web address] Date: 10/21/2022 Prepared by: [name]  Exercises - Sit to Stand with Resistance Around Legs  - 1 x daily - 7 x weekly - 3 sets - 10 reps - Side Stepping with Resistance at Ankles and Counter Support  - 1 x daily - 7 x weekly - 2-3 sets - 1-2 min rounds hold  GOALS: Goals reviewed with patient? Yes  SHORT TERM GOALS: Target date: same as LTG    LONG TERM GOALS: Target date: 11/18/2022    Patient will be independent in HEP to improve functional outcomes Baseline:  Goal status: INITIAL  2.  Improve postural stability and reduce risk for falls during ADL per mild-moderate sway condition  4 M-CTSIB x 15 sec Baseline: severe/LOB Goal status: INITIAL  3.  Demo improved right single limb support and strength via ability to perform 5 sec single leg stance Baseline: unable Goal status: INITIAL  4.  Improve right hip abduction strength to 4/5 to reduce hip drop and improve single leg stability Baseline: 3+/5 Goal status: INITIAL  5. Demo improved balance and reduce risk for falls per score 52/56 Berg Balance Test  Baseline: 48/56  Goal status: INITIAL    ASSESSMENT:  CLINICAL IMPRESSION: Patient is a 72 y.o. male who was seen today for physical therapy evaluation and treatment for cerebral infarction who presents with lingering deficits and limitations.  Thankfully, patient demonstrates global improvements since the outset and has been able to progress ambulation without AD.  Demonstrates some areas of focal weakness affecting his RLE with limitations in single limb support from hip abductor weakness and also balance and postural stability per M-CTSIB. Patient would benefit from continued PT Services to improve functional mobility    OBJECTIVE IMPAIRMENTS: Abnormal gait, decreased activity tolerance, decreased balance, decreased mobility, difficulty walking, and decreased strength.   ACTIVITY LIMITATIONS: carrying, lifting, bending, and locomotion level  PARTICIPATION LIMITATIONS: driving, shopping, community activity, occupation, and yard work  PERSONAL FACTORS: Age, Time since onset of injury/illness/exacerbation, and 1-2 comorbidities: PMH  are also affecting patient's functional outcome.   REHAB POTENTIAL: Excellent  CLINICAL DECISION MAKING: Stable/uncomplicated  EVALUATION COMPLEXITY: Low  PLAN:  PT FREQUENCY: 1x/week  PT DURATION: 4 weeks  PLANNED INTERVENTIONS: Therapeutic exercises, Therapeutic activity, Neuromuscular re-education, Balance training, Gait training, Patient/Family education, Self Care, Joint mobilization, Stair training, Vestibular  training, Canalith repositioning, Orthotic/Fit training, DME instructions, Aquatic Therapy, Dry Needling, Spinal mobilization, and Manual therapy  PLAN FOR NEXT SESSION: HEP review, corner balance for HEP, additional hip strength with blue t-loop   2:19 PM, 10/21/22 M. [name], PT, DPT Physical Therapist-  Office Number: [phone]

## 2022-10-22 DIAGNOSIS — Z794 Long term (current) use of insulin: Secondary | ICD-10-CM | POA: Diagnosis not present

## 2022-10-22 DIAGNOSIS — I639 Cerebral infarction, unspecified: Secondary | ICD-10-CM | POA: Diagnosis not present

## 2022-10-22 DIAGNOSIS — E1129 Type 2 diabetes mellitus with other diabetic kidney complication: Secondary | ICD-10-CM | POA: Diagnosis not present

## 2022-10-22 DIAGNOSIS — I10 Essential (primary) hypertension: Secondary | ICD-10-CM | POA: Diagnosis not present

## 2022-10-22 DIAGNOSIS — E785 Hyperlipidemia, unspecified: Secondary | ICD-10-CM | POA: Diagnosis not present

## 2022-10-27 DIAGNOSIS — E785 Hyperlipidemia, unspecified: Secondary | ICD-10-CM | POA: Diagnosis not present

## 2022-10-27 DIAGNOSIS — I639 Cerebral infarction, unspecified: Secondary | ICD-10-CM | POA: Diagnosis not present

## 2022-10-27 DIAGNOSIS — I10 Essential (primary) hypertension: Secondary | ICD-10-CM | POA: Diagnosis not present

## 2022-10-27 DIAGNOSIS — E1129 Type 2 diabetes mellitus with other diabetic kidney complication: Secondary | ICD-10-CM | POA: Diagnosis not present

## 2022-10-27 DIAGNOSIS — Z794 Long term (current) use of insulin: Secondary | ICD-10-CM | POA: Diagnosis not present

## 2022-10-28 ENCOUNTER — Ambulatory Visit: Payer: Medicare Other

## 2022-10-28 DIAGNOSIS — R2689 Other abnormalities of gait and mobility: Secondary | ICD-10-CM | POA: Diagnosis not present

## 2022-10-28 DIAGNOSIS — M6281 Muscle weakness (generalized): Secondary | ICD-10-CM

## 2022-10-28 DIAGNOSIS — R2681 Unsteadiness on feet: Secondary | ICD-10-CM

## 2022-10-28 DIAGNOSIS — R262 Difficulty in walking, not elsewhere classified: Secondary | ICD-10-CM

## 2022-10-28 NOTE — Therapy (Signed)
OUTPATIENT PHYSICAL THERAPY NEURO TREATMENT   Patient Name: Kenneth [name] MRN: [id number] DOB:1950-10-09, 72 y.o., male Today's Date: 10/28/2022   PCP: [name], MD REFERRING PROVIDER: Charlton Amor, PA-C  END OF SESSION:  PT End of Session - 10/28/22 1447     Visit Number 2    Number of Visits 4    Date for PT Re-Evaluation 11/18/22    Authorization Type Medicare/Medicaid    PT Start Time 1445    PT Stop Time 1530    PT Time Calculation (min) 45 min             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   History of colon polyps - adenomas 04/03/2010   Hyperlipidemia    Hypertension    Iron deficiency anemia, unspecified    OSA (obstructive sleep apnea)    refusing CPAP   Pneumonia 03/2022   w/effusion   Prostate [facility])    Past Surgical History:  Procedure Laterality Date   ANTERIOR CERVICAL DECOMP/DISCECTOMY FUSION N/A 10/01/2012   Procedure: ANTERIOR CERVICAL DECOMPRESSION/DISCECTOMY FUSION 2 LEVELS;  Surgeon: Tia Alert, MD;  Location: [facility];  Service: Neurosurgery;  Laterality: N/A;  Cervical five-six,Cervical six-seven   COLONOSCOPY  09/30/2010, 03/01/2014   diverticulosis, small internal hemorrhoids   COLONOSCOPY W/ POLYPECTOMY  04/03/2010   4 polyps 8-58mm, worst TV adenoma with high-grade dysplasia   LOOP RECORDER INSERTION N/A 10/10/2022   Procedure: LOOP RECORDER INSERTION;  Surgeon: Marinus Maw, MD;  Location: [facility];  Service: Cardiovascular;  Laterality: N/A;   NO PAST SURGERIES     POLYPECTOMY     POSTERIOR CERVICAL FUSION/FORAMINOTOMY Left 10/20/2012   Procedure: C/5-6,C/6-7 Lami/Multi level,POSTERIOR CERVICAL FUSION/FORAMINOTOMY C/4-7;  Surgeon: Tia Alert, MD;  Location: [facility];  Service: Neurosurgery;  Laterality:  Left;  Cervical Five-Six, Cervical Six-Seven Laminectomies, Posterior Cervical Fusion/Foraminotomies Cervical Four through Seven.    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   Syncope 07/23/2017   Leukocytosis 07/23/2017   Hypokalemia 07/23/2017   UTI (urinary tract infection) 07/23/2017   Controlled type 2 diabetes mellitus without complication, without long-term current use of insulin (HCC) 02/10/2017   Mixed hyperlipidemia 02/10/2017   Hearing loss 02/10/2017   OSA (obstructive sleep apnea) 02/10/2017   History of colon polyps - adenomas 04/03/2010    ONSET DATE: 10/08/22  REFERRING DIAG:  I63.9 (ICD-10-CM) - Cerebral infarction, unspecified    THERAPY DIAG:  Difficulty in walking, not elsewhere classified  Muscle weakness (generalized)  Other abnormalities of gait and mobility  Unsteadiness on feet  Rationale for Evaluation and Treatment: Rehabilitation  SUBJECTIVE:  SUBJECTIVE STATEMENT: Nothing new to report. Able to resume driving!  Pt accompanied by: family member  PERTINENT HISTORY: 72 y.o. male with history of T2DM, prostate cancer, congenital hearing loss, HTN who was admitted on 10/08/2022 after awakening in a.m. with weakness LLE greater than RLE as well as difficulty walking.  MRI brain done revealing acute bilateral ACA territory infarcts left greater than right.  PAIN:  Are you having pain?  No  PRECAUTIONS: None  WEIGHT BEARING RESTRICTIONS: No  FALLS: Has patient fallen in last 6 months? Yes. Number of falls 1, day of stroke  LIVING ENVIRONMENT: Lives with: lives with their family Lives in: House/apartment Stairs: Yes: Internal: 1-2 steps; none Has following equipment at home: Single point cane  PLOF: Independent  PATIENT GOALS: 1) be able to drive again. 2) not to get so stressed  OBJECTIVE:   TODAY'S TREATMENT: 10/28/22 Activity Comments  Pt education General fitness recommendations  HEP review   Lateral step-ups 2x10 6" step  Forward step-up 2x10 BUE support 10" step  Lift-off 2x10 6" step for SLS  Standing on foam -Eo/EC x 30 sec -head turns EO/EC 3x  Tandem stance 1x30 sec        PATIENT EDUCATION: Education details: assessment details/findings, rationale of PT intervention Person educated: Patient Education method: Explanation Education comprehension: verbalized understanding and needs further education  HOME EXERCISE PROGRAM: Access Code: [email or web address] URL: [email or web address] Date: 10/21/2022 Prepared by: [name]  Exercises - Sit to Stand with Resistance Around Legs  - 1 x daily - 7 x weekly - 3 sets - 10 reps - Side Stepping with Resistance at Ankles and Counter Support  - 1 x daily - 7 x weekly - 2-3 sets - 1-2 min rounds hold - Lateral Step Up with Counter Support  - 1 x daily - 7 x weekly - 3 sets - 10 reps - Forward Step Up with Counter Support  - 1 x daily - 7 x weekly - 3 sets - 10 reps - Standing Foot Lift on Box (BKA)  - 1 x daily - 7 x weekly - 3 sets - 10 reps  DIAGNOSTIC FINDINGS:  IMPRESSION: 1. No intracranial large vessel occlusion. Asymmetrically narrow right A3, which could represent stenosis versus asymmetric branching. 2. No hemodynamically significant stenosis in the neck.  IMPRESSION: Acute bilateral ACA territory infarcts (left-greater-than-right) involving the parasagittal aspects of the bilateral  parietal lobes. No hemorrhage.  COGNITION: Overall cognitive status: Within functional limits for tasks assessed   SENSATION: WFL  COORDINATION: Able to perform heel to shin Able to perform rapid alternating movements   EDEMA:  BLE edema (chronic)  MUSCLE TONE:   MUSCLE LENGTH: WNL   POSTURE: No Significant postural limitations  LOWER EXTREMITY ROM:     WNL  LOWER EXTREMITY MMT:    MMT Right Eval Left Eval  Hip flexion 4 5  Hip extension    Hip abduction 3+ 5  Hip adduction    Hip internal rotation    Hip external rotation    Knee flexion 4 5  Knee extension 5 5  Ankle dorsiflexion 5 5  Ankle plantarflexion    Ankle inversion    Ankle eversion    (Blank rows = not tested)  BED MOBILITY:  independent  TRANSFERS: Assistive device utilized: None  Sit to stand: Complete Independence Stand to sit: Complete Independence Chair to chair: Complete Independence Floor:  NT    CURB:  Level of Assistance: Complete Independence Assistive device utilized: None Curb  Comments:   STAIRS: NT  GAIT: Gait pattern: WFL and decreased stride length Distance walked:  Assistive device utilized: None Level of assistance: Complete Independence Comments:   FUNCTIONAL TESTS:  5 times sit to stand: 13 sec Timed up and go (TUG): 11.5 sec and 11.5 sec TUG cog Berg Balance Scale: 48/56  M-CTSIB  Condition 1: Firm Surface, EO 30 Sec, Mild Sway  Condition 2: Firm Surface, EC 10 Sec, Moderate Sway  Condition 3: Foam Surface, EO 30 Sec, Mild Sway  Condition 4: Foam Surface, EC 10 Sec, Severe Sway       TODAY'S TREATMENT:                                                                                                                              DATE: 10/21/22      GOALS: Goals reviewed with patient? Yes  SHORT TERM GOALS: Target date: same as LTG    LONG TERM GOALS: Target date: 11/18/2022    Patient will be independent in HEP to improve functional  outcomes Baseline:  Goal status: INITIAL  2.  Improve postural stability and reduce risk for falls during ADL per mild-moderate sway condition 4 M-CTSIB x 15 sec Baseline: severe/LOB Goal status: INITIAL  3.  Demo improved right single limb support and strength via ability to perform 5 sec single leg stance Baseline: unable Goal status: INITIAL  4.  Improve right hip abduction strength to 4/5 to reduce hip drop and improve single leg stability Baseline: 3+/5 Goal status: INITIAL  5. Demo improved balance and reduce risk for falls per score 52/56 Berg Balance Test  Baseline: 48/56  Goal status: INITIAL    ASSESSMENT:  CLINICAL IMPRESSION: Returns to clinic and demo excellent HEP recall. Addition of compound movement strength and balance activities to improve functional status.  Pt reports walking 0.8 mile in 20 min which comes to be average of 2.38 ft/sec.  Encouraged to progress to 2.8 ft/sec pace. Tolerating treatment sessions well. Difficulty with maintaining balance eyes closed compliant surfaces. Continued sessions to advance POC details   OBJECTIVE IMPAIRMENTS: Abnormal gait, decreased activity tolerance, decreased balance, decreased mobility, difficulty walking, and decreased strength.   ACTIVITY LIMITATIONS: carrying, lifting, bending, and locomotion level  PARTICIPATION LIMITATIONS: driving, shopping, community activity, occupation, and yard work  PERSONAL FACTORS: Age, Time since onset of injury/illness/exacerbation, and 1-2 comorbidities: PMH  are also affecting patient's functional outcome.   REHAB POTENTIAL: Excellent  CLINICAL DECISION MAKING: Stable/uncomplicated  EVALUATION COMPLEXITY: Low  PLAN:  PT FREQUENCY: 1x/week  PT DURATION: 4 weeks  PLANNED INTERVENTIONS: Therapeutic exercises, Therapeutic activity, Neuromuscular re-education, Balance training, Gait training, Patient/Family education, Self Care, Joint mobilization, Stair training, Vestibular  training, Canalith repositioning, Orthotic/Fit training, DME instructions, Aquatic Therapy, Dry Needling, Spinal mobilization, and Manual therapy  PLAN FOR NEXT SESSION: HEP review, corner balance for HEP, additional hip strength with blue t-loop   2:47 PM, 10/28/22 M. [name], PT, DPT Physical  Therapist- [place] Office Number: [phone]

## 2022-10-31 ENCOUNTER — Ambulatory Visit: Payer: Medicare Other | Attending: Physician Assistant | Admitting: Physician Assistant

## 2022-10-31 ENCOUNTER — Encounter: Payer: Self-pay | Admitting: Physician Assistant

## 2022-10-31 VITALS — BP 118/60 | HR 55 | Ht 71.0 in | Wt 216.6 lb

## 2022-10-31 DIAGNOSIS — E785 Hyperlipidemia, unspecified: Secondary | ICD-10-CM | POA: Diagnosis not present

## 2022-10-31 DIAGNOSIS — R634 Abnormal weight loss: Secondary | ICD-10-CM

## 2022-10-31 DIAGNOSIS — I6529 Occlusion and stenosis of unspecified carotid artery: Secondary | ICD-10-CM | POA: Diagnosis not present

## 2022-10-31 DIAGNOSIS — I639 Cerebral infarction, unspecified: Secondary | ICD-10-CM | POA: Insufficient documentation

## 2022-10-31 NOTE — Progress Notes (Signed)
Cardiology Office Note:  .   Date:  10/31/2022  ID:  Kenneth [name], DOB Apr 29, 1951, MRN [id number] PCP: [name], MD  [facility] Providers Cardiologist:  Donato Schultz, MD { History of Present Illness: Marland Kitchen   Kenneth [name] is a 72 y.o. 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  Prior echocardiogram 07/24/2017 showed mild concentric hypertrophy and LVEF 65%.  Left ventricular diastolic function parameters were normal.  Systolic pressures were moderately increased.  PA peak pressure 43 mmHg.  Blood pressure has been well-controlled.  No myalgias on statin therapy.  Last seen 02/2022 and at that time had no major complaints.  No issues with chest pain, shortness of breath, or syncope.  Walking for exercise 3 miles a day and feeling good afterwards.  Typically only stresses during tax season but nothing that he is unable to handle.  Every day he plays a gram PNO for at least half an hour which is very relaxing for him additionally he has made some dietary changes.  Completely cut out salt and no longer uses sweet and low packets.  Doing well at that appointment.  Recently admitted with acute CVA 5/31 through 10/17/2022.  Negative bubble study. CT of head and MRI.   Today, he tells me that at home his blood pressure cuff was reading higher.  Most of the fall.  Suggested buying a new cuff if it is more than 74 years old.  Patient has a loop recorder in place.  He has had a stressful couple of months since his wife's is in the hospital and has been since April.  Looking for a long-term care facility.  He feels like he has been doing well given his recent stroke.  Walking with a cane.  Doing PT and OT.  No significant deficits.  Reviewed hospital records.  Echo with bubble negative.  Reports no shortness of breath nor dyspnea on  exertion. Reports no chest pain, pressure, or tightness. No edema, orthopnea, PND. Reports no palpitations.    ROS: Pertinent ROS located in HPI  Studies Reviewed: .       10/09/2022, echocardiogram with bubble study IMPRESSIONS     1. Left ventricular ejection fraction, by estimation, is 55 to 60%. The  left ventricle has normal function. The left ventricle has no regional  wall motion abnormalities. Left ventricular diastolic parameters are  consistent with Grade I diastolic  dysfunction (impaired relaxation).   2. Right ventricular systolic function is normal. The right ventricular  size is normal. There is normal pulmonary artery systolic pressure. The  estimated right ventricular systolic pressure is 29.4 mmHg.   3. The mitral valve is normal in structure. No evidence of mitral valve  regurgitation. No evidence of mitral stenosis.   4. The aortic valve is tricuspid. There is moderate calcification of the  aortic valve. There is moderate thickening of the aortic valve. Aortic  valve regurgitation is not visualized. Aortic valve  sclerosis/calcification is present, without any evidence  of aortic stenosis.   5. The inferior vena cava is normal in size with greater than 50%  respiratory variability, suggesting right atrial pressure of 3 mmHg.   6. Agitated saline contrast bubble study was negative, with no evidence  of any interatrial shunt.   Comparison(s): No significant change from prior study.   Conclusion(s)/Recommendation(s): No intracardiac  source of embolism  detected on this transthoracic study. Consider a transesophageal  echocardiogram to exclude cardiac source of embolism if clinically  indicated.   FINDINGS   Left Ventricle: Left ventricular ejection fraction, by estimation, is 55  to 60%. The left ventricle has normal function. The left ventricle has no  regional wall motion abnormalities. The left ventricular internal cavity  size was normal in size. There is    no left ventricular hypertrophy. Left ventricular diastolic parameters  are consistent with Grade I diastolic dysfunction (impaired relaxation).   Right Ventricle: The right ventricular size is normal. No increase in  right ventricular wall thickness. Right ventricular systolic function is  normal. There is normal pulmonary artery systolic pressure. The tricuspid  regurgitant velocity is 2.57 m/s, and   with an assumed right atrial pressure of 3 mmHg, the estimated right  ventricular systolic pressure is 29.4 mmHg.   Left Atrium: Left atrial size was normal in size.   Right Atrium: Right atrial size was normal in size.   Pericardium: There is no evidence of pericardial effusion.   Mitral Valve: The mitral valve is normal in structure. No evidence of  mitral valve regurgitation. No evidence of mitral valve stenosis.   Tricuspid Valve: The tricuspid valve is normal in structure. Tricuspid  valve regurgitation is mild.   Aortic Valve: The aortic valve is tricuspid. There is moderate  calcification of the aortic valve. There is moderate thickening of the  aortic valve. Aortic valve regurgitation is not visualized. Aortic valve  sclerosis/calcification is present, without any   evidence of aortic stenosis. Aortic valve mean gradient measures 7.0  mmHg. Aortic valve peak gradient measures 13.8 mmHg. Aortic valve area, by  VTI measures 2.81 cm.   Pulmonic Valve: The pulmonic valve was normal in structure. Pulmonic valve  regurgitation is trivial.   Aorta: The aortic root is normal in size and structure.   Venous: The inferior vena cava is normal in size with greater than 50%  respiratory variability, suggesting right atrial pressure of 3 mmHg.   IAS/Shunts: The atrial septum is grossly normal. Agitated saline contrast  was given intravenously to evaluate for intracardiac shunting. Agitated  saline contrast bubble study was negative, with no evidence of any  interatrial shunt.        Physical Exam:   VS:  BP 118/60   Pulse (!) 55   Ht 5\' 11"  (1.803 m)   Wt 216 lb 9.6 oz (98.2 kg)   SpO2 97%   BMI 30.21 kg/m    Wt Readings from Last 3 Encounters:  10/31/22 216 lb 9.6 oz (98.2 kg)  10/14/22 211 lb 6.7 oz (95.9 kg)  10/10/22 208 lb 12.4 oz (94.7 kg)    GEN: Well nourished, well developed in no acute distress NECK: No JVD; No carotid bruits CARDIAC: RRR, no murmurs, rubs, gallops RESPIRATORY:  Clear to auscultation without rales, wheezing or rhonchi  ABDOMEN: Soft, non-tender, non-distended EXTREMITIES:  No edema; No deformity   ASSESSMENT AND PLAN: .   1.  Recent stroke -PT/OT -Walking well with CKD -Carotid ultrasound ordered today -Loop recorder in place to rule out atrial fibrillation -Echocardiogram with bubble study done without PFO  2.  Weight loss -he was trying to loose weight and now is holding steady -Recommend daily walking and low-sodium, heart healthy diet  3.  Hyperlipidemia -A1C well controlled 5.2  -LDL 45 (10/09/2022) -Continue to include Lipitor 40 mg daily      Dispo: Follow-up with  Dr. Anne Fu in 3 months  Signed, Sharlene Dory, PA-C

## 2022-10-31 NOTE — Patient Instructions (Signed)
Medication Instructions:  Your physician recommends that you continue on your current medications as directed. Please refer to the Current Medication list given to you today.  *If you need a refill on your cardiac medications before your next appointment, please call your pharmacy*  Lab Work: None ordered If you have labs (blood work) drawn today and your tests are completely normal, you will receive your results only by: MyChart Message (if you have MyChart) OR A paper copy in the mail If you have any lab test that is abnormal or we need to change your treatment, we will call you to review the results.  Testing/Procedures: Your physician has requested that you have a carotid duplex. This test is an ultrasound of the carotid arteries in your neck. It looks at blood flow through these arteries that supply the brain with blood. Allow one hour for this exam. There are no restrictions or special instructions.  Follow-Up: At [facility], you and your health needs are our priority.  As part of our continuing mission to provide you with exceptional heart care, we have created designated Provider Care Teams.  These Care Teams include your primary Cardiologist (physician) and Advanced Practice Providers (APPs -  Physician Assistants and Nurse Practitioners) who all work together to provide you with the care you need, when you need it.   Your next appointment:   3-4 month(s)  Provider:   Donato Schultz, MD    Low-Sodium Eating Plan Salt (sodium) helps you keep a healthy balance of fluids in your body. Too much sodium can raise your blood pressure. It can also cause fluid and waste to be held in your body. Your health care provider or dietitian may recommend a low-sodium eating plan if you have high blood pressure (hypertension), kidney disease, liver disease, or heart failure. Eating less sodium can help lower your blood pressure and reduce swelling. It can also protect your heart, liver, and  kidneys. What are tips for following this plan? Reading food labels  Check food labels for the amount of sodium per serving. If you eat more than one serving, you must multiply the listed amount by the number of servings. Choose foods with less than 140 milligrams (mg) of sodium per serving. Avoid foods with 300 mg of sodium or more per serving. Always check how much sodium is in a product, even if the label says "unsalted" or "no salt added." Shopping  Buy products labeled as "low-sodium" or "no salt added." Buy fresh foods. Avoid canned foods and pre-made or frozen meals. Avoid canned, cured, or processed meats. Buy breads that have less than 80 mg of sodium per slice. Cooking  Eat more home-cooked food. Try to eat less restaurant, buffet, and fast food. Try not to add salt when you cook. Use salt-free seasonings or herbs instead of table salt or sea salt. Check with your provider or pharmacist before using salt substitutes. Cook with plant-based oils, such as canola, sunflower, or olive oil. Meal planning When eating at a restaurant, ask if your food can be made with less salt or no salt. Avoid dishes labeled as brined, pickled, cured, or smoked. Avoid dishes made with soy sauce, miso, or teriyaki sauce. Avoid foods that have monosodium glutamate (MSG) in them. MSG may be added to some restaurant food, sauces, soups, bouillon, and canned foods. Make meals that can be grilled, baked, poached, roasted, or steamed. These are often made with less sodium. General information Try to limit your sodium intake to  1,500-2,300 mg each day, or the amount told by your provider. What foods should I eat? Fruits Fresh, frozen, or canned fruit. Fruit juice. Vegetables Fresh or frozen vegetables. "No salt added" canned vegetables. "No salt added" tomato sauce and paste. Low-sodium or reduced-sodium tomato and vegetable juice. Grains Low-sodium cereals, such as oats, puffed wheat and rice, and  shredded wheat. Low-sodium crackers. Unsalted rice. Unsalted pasta. Low-sodium bread. Whole grain breads and whole grain pasta. Meats and other proteins Fresh or frozen meat, poultry, seafood, and fish. These should have no added salt. Low-sodium canned tuna and salmon. Unsalted nuts. Dried peas, beans, and lentils without added salt. Unsalted canned beans. Eggs. Unsalted nut butters. Dairy Milk. Soy milk. Cheese that is naturally low in sodium, such as ricotta cheese, fresh mozzarella, or Swiss cheese. Low-sodium or reduced-sodium cheese. Cream cheese. Yogurt. Seasonings and condiments Fresh and dried herbs and spices. Salt-free seasonings. Low-sodium mustard and ketchup. Sodium-free salad dressing. Sodium-free light mayonnaise. Fresh or refrigerated horseradish. Lemon juice. Vinegar. Other foods Homemade, reduced-sodium, or low-sodium soups. Unsalted popcorn and pretzels. Low-salt or salt-free chips. The items listed above may not be all the foods and drinks you can have. Talk to a dietitian to learn more. What foods should I avoid? Vegetables Sauerkraut, pickled vegetables, and relishes. Olives. [place] fries. Onion rings. Regular canned vegetables, except low-sodium or reduced-sodium items. Regular canned tomato sauce and paste. Regular tomato and vegetable juice. Frozen vegetables in sauces. Grains Instant hot cereals. Bread stuffing, pancake, and biscuit mixes. Croutons. Seasoned rice or pasta mixes. Noodle soup cups. Boxed or frozen macaroni and cheese. Regular salted crackers. Self-rising flour. Meats and other proteins Meat or fish that is salted, canned, smoked, spiced, or pickled. Precooked or cured meat, such as sausages or meat loaves. Tomasa Blase. Ham. Pepperoni. Hot dogs. Corned beef. Chipped beef. Salt pork. Jerky. Pickled herring, anchovies, and sardines. Regular canned tuna. Salted nuts. Dairy Processed cheese and cheese spreads. Hard cheeses. Cheese curds. Blue cheese. Feta cheese.  String cheese. Regular cottage cheese. Buttermilk. Canned milk. Fats and oils Salted butter. Regular margarine. Ghee. Bacon fat. Seasonings and condiments Onion salt, garlic salt, seasoned salt, table salt, and sea salt. Canned and packaged gravies. Worcestershire sauce. Tartar sauce. Barbecue sauce. Teriyaki sauce. Soy sauce, including reduced-sodium soy sauce. Steak sauce. Fish sauce. Oyster sauce. Cocktail sauce. Horseradish that you find on the shelf. Regular ketchup and mustard. Meat flavorings and tenderizers. Bouillon cubes. Hot sauce. Pre-made or packaged marinades. Pre-made or packaged taco seasonings. Relishes. Regular salad dressings. Salsa. Other foods Salted popcorn and pretzels. Corn chips and puffs. Potato and tortilla chips. Canned or dried soups. Pizza. Frozen entrees and pot pies. The items listed above may not be all the foods and drinks you should avoid. Talk to a dietitian to learn more. This information is not intended to replace advice given to you by your health care provider. Make sure you discuss any questions you have with your health care provider. Document Revised: 05/15/2022 Document Reviewed: 05/15/2022 Elsevier Patient Education  2024 [employer]  Heart-Healthy Eating Plan Many factors influence your heart health, including eating and exercise habits. Heart health is also called coronary health. Coronary risk increases with abnormal blood fat (lipid) levels. A heart-healthy eating plan includes limiting unhealthy fats, increasing healthy fats, limiting salt (sodium) intake, and making other diet and lifestyle changes. What is my plan? Your health care provider may recommend that: You limit your fat intake to _________% or less of your total calories each day. You limit  your saturated fat intake to _________% or less of your total calories each day. You limit the amount of cholesterol in your diet to less than _________ mg per day. You limit the amount of sodium  in your diet to less than _________ mg per day. What are tips for following this plan? Cooking Cook foods using methods other than frying. Baking, boiling, grilling, and broiling are all good options. Other ways to reduce fat include: Removing the skin from poultry. Removing all visible fats from meats. Steaming vegetables in water or broth. Meal planning  At meals, imagine dividing your plate into fourths: Fill one-half of your plate with vegetables and green salads. Fill one-fourth of your plate with whole grains. Fill one-fourth of your plate with lean protein foods. Eat 2-4 cups of vegetables per day. One cup of vegetables equals 1 cup (91 g) broccoli or cauliflower florets, 2 medium carrots, 1 large bell pepper, 1 large sweet potato, 1 large tomato, 1 medium white potato, 2 cups (150 g) raw leafy greens. Eat 1-2 cups of fruit per day. One cup of fruit equals 1 small apple, 1 large banana, 1 cup (237 g) mixed fruit, 1 large orange,  cup (82 g) dried fruit, 1 cup (240 mL) 100% fruit juice. Eat more foods that contain soluble fiber. Examples include apples, broccoli, carrots, beans, peas, and barley. Aim to get 25-30 g of fiber per day. Increase your consumption of legumes, nuts, and seeds to 4-5 servings per week. One serving of dried beans or legumes equals  cup (90 g) cooked, 1 serving of nuts is  oz (12 almonds, 24 pistachios, or 7 walnut halves), and 1 serving of seeds equals  oz (8 g). Fats Choose healthy fats more often. Choose monounsaturated and polyunsaturated fats, such as olive and canola oils, avocado oil, flaxseeds, walnuts, almonds, and seeds. Eat more omega-3 fats. Choose salmon, mackerel, sardines, tuna, flaxseed oil, and ground flaxseeds. Aim to eat fish at least 2 times each week. Check food labels carefully to identify foods with trans fats or high amounts of saturated fat. Limit saturated fats. These are found in animal products, such as meats, butter, and cream.  Plant sources of saturated fats include palm oil, palm kernel oil, and coconut oil. Avoid foods with partially hydrogenated oils in them. These contain trans fats. Examples are stick margarine, some tub margarines, cookies, crackers, and other baked goods. Avoid fried foods. General information Eat more home-cooked food and less restaurant, buffet, and fast food. Limit or avoid alcohol. Limit foods that are high in added sugar and simple starches such as foods made using white refined flour (white breads, pastries, sweets). Lose weight if you are overweight. Losing just 5-10% of your body weight can help your overall health and prevent diseases such as diabetes and heart disease. Monitor your sodium intake, especially if you have high blood pressure. Talk with your health care provider about your sodium intake. Try to incorporate more vegetarian meals weekly. What foods should I eat? Fruits All fresh, canned (in natural juice), or frozen fruits. Vegetables Fresh or frozen vegetables (raw, steamed, roasted, or grilled). Green salads. Grains Most grains. Choose whole wheat and whole grains most of the time. Rice and pasta, including brown rice and pastas made with whole wheat. Meats and other proteins Lean, well-trimmed beef, veal, pork, and lamb. Chicken and [place] without skin. All fish and shellfish. Wild duck, rabbit, pheasant, and venison. Egg whites or low-cholesterol egg substitutes. Dried beans, peas, lentils, and tofu.  Seeds and most nuts. Dairy Low-fat or nonfat cheeses, including ricotta and mozzarella. Skim or 1% milk (liquid, powdered, or evaporated). Buttermilk made with low-fat milk. Nonfat or low-fat yogurt. Fats and oils Non-hydrogenated (trans-free) margarines. Vegetable oils, including soybean, sesame, sunflower, olive, avocado, peanut, safflower, corn, canola, and cottonseed. Salad dressings or mayonnaise made with a vegetable oil. Beverages Water (mineral or sparkling).  Coffee and tea. Unsweetened ice tea. Diet beverages. Sweets and desserts Sherbet, gelatin, and fruit ice. Small amounts of dark chocolate. Limit all sweets and desserts. Seasonings and condiments All seasonings and condiments. The items listed above may not be a complete list of foods and beverages you can eat. Contact a dietitian for more options. What foods should I avoid? Fruits Canned fruit in heavy syrup. Fruit in cream or butter sauce. Fried fruit. Limit coconut. Vegetables Vegetables cooked in cheese, cream, or butter sauce. Fried vegetables. Grains Breads made with saturated or trans fats, oils, or whole milk. Croissants. Sweet rolls. Donuts. High-fat crackers, such as cheese crackers and chips. Meats and other proteins Fatty meats, such as hot dogs, ribs, sausage, bacon, rib-eye roast or steak. High-fat deli meats, such as salami and bologna. Caviar. Domestic duck and goose. Organ meats, such as liver. Dairy Cream, sour cream, cream cheese, and creamed cottage cheese. Whole-milk cheeses. Whole or 2% milk (liquid, evaporated, or condensed). Whole buttermilk. Cream sauce or high-fat cheese sauce. Whole-milk yogurt. Fats and oils Meat fat, or shortening. Cocoa butter, hydrogenated oils, palm oil, coconut oil, palm kernel oil. Solid fats and shortenings, including bacon fat, salt pork, lard, and butter. Nondairy cream substitutes. Salad dressings with cheese or sour cream. Beverages Regular sodas and any drinks with added sugar. Sweets and desserts Frosting. Pudding. Cookies. Cakes. Pies. Milk chocolate or white chocolate. Buttered syrups. Full-fat ice cream or ice cream drinks. The items listed above may not be a complete list of foods and beverages to avoid. Contact a dietitian for more information. Summary Heart-healthy meal planning includes limiting unhealthy fats, increasing healthy fats, limiting salt (sodium) intake and making other diet and lifestyle changes. Lose weight if  you are overweight. Losing just 5-10% of your body weight can help your overall health and prevent diseases such as diabetes and heart disease. Focus on eating a balance of foods, including fruits and vegetables, low-fat or nonfat dairy, lean protein, nuts and legumes, whole grains, and heart-healthy oils and fats. This information is not intended to replace advice given to you by your health care provider. Make sure you discuss any questions you have with your health care provider. Document Revised: 06/03/2021 Document Reviewed: 06/03/2021 Elsevier Patient Education  2024 [employer].

## 2022-11-02 NOTE — H&P (Signed)
Expand All Collapse All      Physical Medicine and Rehabilitation Admission H&P        Chief Complaint  Patient presents with   Functional  deficits due to stroke   Weakness      HPI: Kenneth [name] is a 72 year old RH male with history of T2DM, prostate CA (Dr. Annabell Howells), congential hearing loss, HTN; who was admitted on 10/08/22 after getting up in morning and having weakness LLE>RLE and  difficulty walking. CTA head neck negative for LVO. 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 2D echo showed EF 55-60% with no wall abnormalities, moderate thickening of aortic valve without stenosis and negative bubble study. BLE dopplers were negative for DVT.    Dr. Roda Shutters felt that stroke likely cardio embolic in nature and recommended DAPT X 3 weeks followed by Plavix alone. Loop recorder placed by Dr. Ladona Ridgel on 05/31 prior to discharge.     He was independent and working full time PTA. Patient has had functional decline and CIR recommended  by therapy team.        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   Eyes:  Negative for blurred vision and double vision.       Wears contacts  Respiratory:  Negative for cough and shortness of breath.   Cardiovascular:  Negative for chest pain and palpitations.  Gastrointestinal:  Negative for abdominal pain, heartburn and nausea.  Genitourinary:  Negative for dysuria and urgency.  Musculoskeletal:  Negative for back pain, joint pain, myalgias and neck pain.  Neurological:  Positive for weakness. Negative for dizziness, sensory change and headaches.  Psychiatric/Behavioral:  The patient is not nervous/anxious and does not have insomnia.           Past Medical History:  Diagnosis Date   Broken neck (HCC)     Cervical spinal stenosis      ACDF complicated with fracture s/p posterior fusion    Diabetes mellitus     Hearing loss of both ears      Since birth   History of colon polyps - adenomas 04/03/2010   Hyperlipidemia     Hypertension     Iron deficiency anemia, unspecified     OSA (obstructive sleep apnea)      refusing CPAP   Pneumonia 03/2022    w/effusion   Prostate cancer [facility])             Past Surgical History:  Procedure Laterality Date   ANTERIOR CERVICAL DECOMP/DISCECTOMY FUSION N/A 10/01/2012    Procedure: ANTERIOR CERVICAL DECOMPRESSION/DISCECTOMY FUSION 2 LEVELS;  Surgeon: Tia Alert, MD;  Location: [facility];  Service: Neurosurgery;  Laterality: N/A;  Cervical five-six,Cervical six-seven   COLONOSCOPY   09/30/2010, 03/01/2014    diverticulosis, small internal hemorrhoids   COLONOSCOPY W/ POLYPECTOMY   04/03/2010    4 polyps 8-56mm, worst TV adenoma with high-grade dysplasia   NO PAST SURGERIES       POLYPECTOMY       POSTERIOR CERVICAL FUSION/FORAMINOTOMY Left 10/20/2012    Procedure: C/5-6,C/6-7 Lami/Multi level,POSTERIOR CERVICAL FUSION/FORAMINOTOMY C/4-7;  Surgeon: Tia Alert, MD;  Location: [facility];  Service: Neurosurgery;  Laterality: Left;  Cervical Five-Six, Cervical Six-Seven Laminectomies, Posterior Cervical Fusion/Foraminotomies Cervical Four through Seven.    TONSILLECTOMY  Family History  Problem Relation Age of Onset   Diabetes Mother     Stomach cancer Neg Hx     Rectal cancer Neg Hx     Pancreatic cancer Neg Hx     Colon cancer Neg Hx     Colon polyps Neg Hx     Esophageal cancer Neg Hx     Prostate cancer Neg Hx     Breast cancer Neg Hx        Social History:  Married. Full time CPA --owns firm.  Does not have any children and family OOT. He reports that he quit smoking about 53 years ago. His smoking use included cigarettes. He has a 0.50 pack-year smoking history. He has never used smokeless tobacco. He reports that he does not drink alcohol and does not use drugs.          Allergies  Allergen  Reactions   [facility] due to complications   Penicillins        CHILDHOOD ALLERGY Has patient had a PCN reaction causing immediate rash, facial/tongue/throat swelling, SOB or lightheadedness with hypotension: Unknown Has patient had a PCN reaction causing severe rash involving mucus membranes or skin necrosis: Unknown Has patient had a PCN reaction that required hospitalization: Unknown Has patient had a PCN reaction occurring within the last 10 years: Unknown If all of the above answers are "NO", then may proceed with Cephalosporin use.                Medications Prior to Admission  Medication Sig Dispense Refill   acetaminophen (TYLENOL) 500 MG tablet Take 500 mg by mouth daily as needed for headache.       aspirin 81 MG tablet Take 81 mg by mouth every evening.       benazepril (LOTENSIN) 40 MG tablet Take 1 tablet (40 mg total) by mouth daily. Please schedule appt for future refills. 1st attempt 30 tablet 0   buPROPion ER (WELLBUTRIN SR) 100 MG 12 hr tablet Take 100 mg by mouth daily.       chlorthalidone (HYGROTON) 25 MG tablet Take 25 mg by mouth daily.       denosumab (PROLIA) 60 MG/ML SOSY injection Inject 60 mg into the skin every 6 (six) months.       ezetimibe (ZETIA) 10 MG tablet Take 10 mg by mouth daily.       metFORMIN (GLUCOPHAGE) 500 MG tablet Take 500 mg by mouth 2 (two) times daily with a meal.       MOUNJARO 5 MG/0.5ML Pen Inject 5 mg into the skin once a week.       tamsulosin (FLOMAX) 0.4 MG CAPS capsule Take 0.4 mg by mouth daily. Patient states he is taking it twice daily       TOUJEO SOLOSTAR 300 UNIT/ML Solostar Pen Inject 36 Units into the skin daily.       VITAMIN D PO Take 1 tablet by mouth 2 (two) times daily. Twice a day pr patient       [DISCONTINUED] atorvastatin (LIPITOR) 40 MG tablet Take 40 mg by mouth daily.       B-D UF III MINI PEN NEEDLES 31G X 5 MM MISC Inject 1 Stick as directed as directed.             Home: Home Living Family/patient expects to be discharged to:: Private residence Living Arrangements: Spouse/significant other (wife currently hospitalized in [place] since  April) Available Help at Discharge: Available PRN/intermittently, Family Type of Home: House Home Access: Stairs to enter [employer] of Steps: 2 Entrance Stairs-Rails: Can reach both, Left, Right Home Layout: Two level, Able to live on main level with bedroom/bathroom Bathroom Shower/Tub: Health visitor: Standard Bathroom Accessibility: Yes Home Equipment: BSC/3in1, Shower seat - built in, Agricultural consultant (2 wheels), IT sales professional, Medical laboratory scientific officer - single point  Lives With: Spouse   Functional History: Prior Function Prior Level of Function : Independent/Modified Independent, Driving, Working/employed Mobility Comments: no AD, typically walks every day at gym ADLs Comments: 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 walks his small dog, Bella   Functional Status:  Mobility: Bed Mobility General bed mobility comments: Pt up in recliner. Transfers Overall transfer level: Needs assistance Equipment used: Rolling walker (2 wheels) Transfers: Sit to/from Stand Sit to Stand: Min guard General transfer comment: improvements to min guard when pushing from armrests Ambulation/Gait Ambulation/Gait assistance: Min assist Gait Distance (Feet): 50 Feet Assistive device: Rolling walker (2 wheels) Gait Pattern/deviations: Step-through pattern, Narrow base of support, Decreased stride length, Drifts right/left General Gait Details: slow, guarded gait. Amb initially with RW min guard straght level surface and min assist turning, managing obstacles. Short gait trial in room without AD: pt reaching for UE support, shuffle steps, unsteady Gait velocity: decreased Gait velocity interpretation: 1.31 - 2.62 ft/sec, indicative of limited community ambulator   ADL: ADL Overall ADL's :  Needs assistance/impaired Eating/Feeding: Independent Grooming: Min guard, Standing, Oral care, Wash/dry face, Brushing hair Grooming Details (indicate cue type and reason): able to stand without UE support during tasks. mild sway but able to correct Upper Body Bathing: Set up, Sitting Lower Body Bathing: Minimal assistance, Sit to/from stand Upper Body Dressing : Set up, Sitting Lower Body Dressing: Minimal assistance, Sit to/from stand Lower Body Dressing Details (indicate cue type and reason): able to cross LE to each socks, some assist for balance in standing/donning around waist likely Toilet Transfer: Min guard, Ambulation, Rolling walker (2 wheels) Toilet Transfer Details (indicate cue type and reason): use of RW needed for stability, would need Min A for mobility without AD Toileting- Clothing Manipulation and Hygiene: Minimal assistance, Sit to/from stand, Sitting/lateral lean Functional mobility during ADLs: Min guard, Rolling walker (2 wheels) General ADL Comments: pt w/ balance and LE strength deficits requiring new DME use for mobility and increased time/effort for ADLs. Pt likely needing increased assist for IADLs and would benefit from higher level cognitive assessments   Cognition: Cognition Overall Cognitive Status: Within Functional Limits for tasks assessed Orientation Level: Oriented X4 Cognition Arousal/Alertness: Awake/alert Behavior During Therapy: WFL for tasks assessed/performed Overall Cognitive Status: Within Functional Limits for tasks assessed General Comments: midly labile but overall pleasant, WFL. shows some insight into deficits but also reports desire to go back to work on Monday     Blood pressure 112/67, pulse 66, temperature 98.6 F (37 C), temperature source Oral, resp. rate 16, height 5\' 11"  (1.803 m), weight 94.7 kg, SpO2 95 %. Physical Exam Constitutional: No apparent distress. Appropriate appearance for age.  HENT: No JVD. Neck Supple. Trachea  midline. Atraumatic, normocephalic. +Bilateral hearing aides Eyes: PERRLA. EOMI. Visual fields grossly intact.  Cardiovascular: RRR, no murmurs/rub/gallops. Trace bilateral Edema. Peripheral pulses 2+  Respiratory: CTAB. No rales, rhonchi, or wheezing. On RA.  Abdomen: + bowel sounds, normoactive. Mild distention, no tenderness.  Skin: C/D/I. No apparent lesions. MSK:      No apparent  deformity.      Strength:                RUE: 5-/5 SA, 5/5 EF, 5/5 EE, 5/5 WE, 5/5 FF, 5/5 FA                 LUE: 5-/5 SA, 5-/5 EF, 5-/5 EE, 5/5 WE, 5/5 FF, 5/5 FA                 RLE: 5/5 HF, 5/5 KE, 5/5 DF, 5/5 EHL, 5/5 PF                 LLE:  5/5 HF, 5/5 KE, 5/5 DF, 5/5 EHL, 5/5 PF    Neurologic exam:  Cognition: AAO to person, place, time and event.  Language: Fluent, No substitutions or neoglisms. Mild speech impediment baseline per patient. Names 3/3 objects correctly Memory: Recalls 3/3 objects at 5 minutes. No apparent deficits  Insight: Good insight into current condition.  Mood: Pleasant affect, appropriate mood.  Sensation: To light touch intact in BL UEs and LEs  Reflexes: 2+ in BL UE and LEs. Negative Hoffman's and babinski signs bilaterally.  CN: 2-12 grossly intact.  Coordination: Mild L>R UE ataxia; no LE ataxia. Right truncal drift with eyes closed.  Spasticity: MAS 0 in all extremities.  Gait: Not observed      Lab Results Last 48 Hours        Results for orders placed or performed during the hospital encounter of 10/08/22 (from the past 48 hour(s))  CBC with Differential     Status: Abnormal    Collection Time: 10/08/22  4:42 PM  Result Value Ref Range    WBC 9.3 4.0 - 10.5 K/uL    RBC 4.19 (L) 4.22 - 5.81 MIL/uL    Hemoglobin 12.4 (L) 13.0 - 17.0 g/dL    HCT 16.1 (L) 09.6 - 52.0 %    MCV 90.5 80.0 - 100.0 fL    MCH 29.6 26.0 - 34.0 pg    MCHC 32.7 30.0 - 36.0 g/dL    RDW 04.5 40.9 - 81.1 %    Platelets 180 150 - 400 K/uL    nRBC 0.0 0.0 - 0.2 %    Neutrophils Relative %  65 %    Neutro Abs 6.1 1.7 - 7.7 K/uL    Lymphocytes Relative 18 %    Lymphs Abs 1.7 0.7 - 4.0 K/uL    Monocytes Relative 14 %    Monocytes Absolute 1.3 (H) 0.1 - 1.0 K/uL    Eosinophils Relative 2 %    Eosinophils Absolute 0.2 0.0 - 0.5 K/uL    Basophils Relative 1 %    Basophils Absolute 0.1 0.0 - 0.1 K/uL    Immature Granulocytes 0 %    Abs Immature Granulocytes 0.03 0.00 - 0.07 K/uL      Comment: Performed at [facility], [address], [place], [place] [address]  Comprehensive metabolic panel     Status: Abnormal    Collection Time: 10/08/22  4:42 PM  Result Value Ref Range    Sodium 137 135 - 145 mmol/L    Potassium 3.4 (L) 3.5 - 5.1 mmol/L    Chloride 100 98 - 111 mmol/L    CO2 28 22 - 32 mmol/L    Glucose, Bld 96 70 - 99 mg/dL      Comment: Glucose reference range applies only to samples taken after fasting for at least 8 hours.  BUN 22 8 - 23 mg/dL    Creatinine, Ser 0.98 0.61 - 1.24 mg/dL    Calcium 9.7 8.9 - 11.9 mg/dL    Total Protein 7.1 6.5 - 8.1 g/dL    Albumin 3.8 3.5 - 5.0 g/dL    AST 17 15 - 41 U/L    ALT 16 0 - 44 U/L    Alkaline Phosphatase 51 38 - 126 U/L    Total Bilirubin 1.5 (H) 0.3 - 1.2 mg/dL    GFR, Estimated >14 >78 mL/min      Comment: (NOTE) Calculated using the CKD-EPI Creatinine Equation (2021)      Anion gap 9 5 - 15      Comment: Performed at [facility], [address], [place], [place] [address]  Urinalysis, Routine w reflex microscopic -Urine, Clean Catch     Status: None    Collection Time: 10/08/22  5:57 PM  Result Value Ref Range    Color, Urine YELLOW YELLOW    APPearance CLEAR CLEAR    Specific Gravity, Urine 1.009 1.005 - 1.030    pH 6.0 5.0 - 8.0    Glucose, UA NEGATIVE NEGATIVE mg/dL    Hgb urine dipstick NEGATIVE NEGATIVE    Bilirubin Urine NEGATIVE NEGATIVE    Ketones, ur NEGATIVE NEGATIVE mg/dL    Protein, ur NEGATIVE NEGATIVE mg/dL    Nitrite NEGATIVE NEGATIVE    Leukocytes,Ua  NEGATIVE NEGATIVE      Comment: Performed at [facility], [address] [place], [place] [address]  Lipid panel     Status: Abnormal    Collection Time: 10/09/22  6:25 AM  Result Value Ref Range    Cholesterol 80 0 - 200 mg/dL    Triglycerides 85 <578 mg/dL    HDL 18 (L) >46 mg/dL    Total CHOL/HDL Ratio 4.4 RATIO    VLDL 17 0 - 40 mg/dL    LDL Cholesterol 45 0 - 99 mg/dL      Comment:        Total Cholesterol/HDL:CHD Risk Coronary Heart Disease Risk Table                     Men   Women  1/2 Average Risk   3.4   3.3  Average Risk       5.0   4.4  2 X Average Risk   9.6   7.1  3 X Average Risk  23.4   11.0        Use the calculated Patient Ratio above and the CHD Risk Table to determine the patient's CHD Risk.        ATP III CLASSIFICATION (LDL):  <100     mg/dL   Optimal  962-952  mg/dL   Near or Above                    Optimal  130-159  mg/dL   Borderline  841-324  mg/dL   High  >401     mg/dL   Very High Performed at [facility], [address], [place], [place] [address]    Hemoglobin A1c     Status: None    Collection Time: 10/09/22  6:25 AM  Result Value Ref Range    Hgb A1c MFr Bld 5.6 4.8 - 5.6 %      Comment: (NOTE)         Prediabetes: 5.7 - 6.4  Diabetes: >6.4         Glycemic control for adults with diabetes: <7.0      Mean Plasma Glucose 114 mg/dL      Comment: (NOTE) Performed At: [facility] [address] [place], [place] [id number] Jolene Schimke MD NW:[id number]    CBC     Status: Abnormal    Collection Time: 10/09/22  6:25 AM  Result Value Ref Range    WBC 8.5 4.0 - 10.5 K/uL    RBC 4.19 (L) 4.22 - 5.81 MIL/uL    Hemoglobin 12.5 (L) 13.0 - 17.0 g/dL    HCT 57.8 (L) 46.9 - 52.0 %    MCV 90.0 80.0 - 100.0 fL    MCH 29.8 26.0 - 34.0 pg    MCHC 33.2 30.0 - 36.0 g/dL    RDW 62.9 52.8 - 41.3 %    Platelets 172 150 - 400 K/uL    nRBC 0.0 0.0 - 0.2 %      Comment: Performed at [facility], [address] [place], [place] [address]  Basic metabolic panel     Status: None    Collection Time: 10/09/22  6:25 AM  Result Value Ref Range    Sodium 138 135 - 145 mmol/L    Potassium 3.8 3.5 - 5.1 mmol/L    Chloride 102 98 - 111 mmol/L    CO2 25 22 - 32 mmol/L    Glucose, Bld 85 70 - 99 mg/dL      Comment: Glucose reference range applies only to samples taken after fasting for at least 8 hours.    BUN 14 8 - 23 mg/dL    Creatinine, Ser 0.27 0.61 - 1.24 mg/dL    Calcium 9.3 8.9 - 25.3 mg/dL    GFR, Estimated >66 >44 mL/min      Comment: (NOTE) Calculated using the CKD-EPI Creatinine Equation (2021)      Anion gap 11 5 - 15      Comment: Performed at [facility], [address] [place], [place] [address]  Magnesium     Status: None    Collection Time: 10/09/22  6:25 AM  Result Value Ref Range    Magnesium 1.7 1.7 - 2.4 mg/dL      Comment: Performed at [facility], [address] [place], [place] [address]  Phosphorus     Status: None    Collection Time: 10/09/22  6:25 AM  Result Value Ref Range    Phosphorus 3.9 2.5 - 4.6 mg/dL      Comment: Performed at [facility], [address], [place], [place] [address]  Glucose, capillary     Status: Abnormal    Collection Time: 10/09/22 12:25 PM  Result Value Ref Range    Glucose-Capillary 101 (H) 70 - 99 mg/dL      Comment: Glucose reference range applies only to samples taken after fasting for at least 8 hours.    Comment 1 Notify RN      Comment 2 Document in Chart    Glucose, capillary     Status: None    Collection Time: 10/09/22  4:28 PM  Result Value Ref Range    Glucose-Capillary 79 70 - 99 mg/dL      Comment: Glucose reference range applies only to samples taken after fasting for at least 8 hours.    Comment 1 Notify RN      Comment 2 Document in Chart  Glucose, capillary     Status: Abnormal    Collection Time: 10/09/22  9:15 PM  Result Value Ref Range    Glucose-Capillary 100 (H) 70 - 99  mg/dL      Comment: Glucose reference range applies only to samples taken after fasting for at least 8 hours.    Comment 1 Notify RN    Glucose, capillary     Status: None    Collection Time: 10/10/22  6:31 AM  Result Value Ref Range    Glucose-Capillary 96 70 - 99 mg/dL      Comment: Glucose reference range applies only to samples taken after fasting for at least 8 hours.  Glucose, capillary     Status: Abnormal    Collection Time: 10/10/22 12:04 PM  Result Value Ref Range    Glucose-Capillary 101 (H) 70 - 99 mg/dL      Comment: Glucose reference range applies only to samples taken after fasting for at least 8 hours.    Comment 1 Notify RN      Comment 2 Document in Chart         Imaging Results (Last 48 hours)  EP PPM/ICD IMPLANT   Result Date: 10/10/2022 CONCLUSIONS:  1. Successful implantation of a [employer] Reveal LINQ implantable loop recorder for cryptogenic stroke  2. No early apparent complications. Lewayne Bunting, MD 10/10/2022 2:52 PM    VAS [place] LOWER EXTREMITY VENOUS (DVT)   Result Date: 10/09/2022  Lower Venous DVT Study Patient Name:  KHAMIR APPLIN  Date of Exam:   10/09/2022 Medical Rec #: [id number]         Accession #:    [id number] Date of Birth: Mar 12, 1951         Patient Gender: M Patient Age:   58 years Exam Location:  [facility] Procedure:      VAS [place] LOWER EXTREMITY VENOUS (DVT) Referring Phys: Scheryl Marten XU --------------------------------------------------------------------------------  Indications: Stroke.  Comparison Study: No prior studies. Performing Technologist: Jean Rosenthal RDMS, RVT  Examination Guidelines: A complete evaluation includes B-mode imaging, spectral Doppler, color Doppler, and power Doppler as needed of all accessible portions of each vessel. Bilateral testing is considered an integral part of a complete examination. Limited examinations for reoccurring indications may be performed as noted. The reflux portion of the exam is performed with the  patient in reverse Trendelenburg.  +---------+---------------+---------+-----------+----------+--------------+ RIGHT    CompressibilityPhasicitySpontaneityPropertiesThrombus Aging +---------+---------------+---------+-----------+----------+--------------+ CFV      Full           Yes      Yes                                 +---------+---------------+---------+-----------+----------+--------------+ SFJ      Full                                                        +---------+---------------+---------+-----------+----------+--------------+ FV Prox  Full                                                        +---------+---------------+---------+-----------+----------+--------------+ FV Mid   Full                                                        +---------+---------------+---------+-----------+----------+--------------+  FV DistalFull                                                        +---------+---------------+---------+-----------+----------+--------------+ PFV      Full                                                        +---------+---------------+---------+-----------+----------+--------------+ POP      Full           Yes      Yes                                 +---------+---------------+---------+-----------+----------+--------------+ PTV      Full                                                        +---------+---------------+---------+-----------+----------+--------------+ PERO     Full                                                        +---------+---------------+---------+-----------+----------+--------------+ Gastroc  Full                                                        +---------+---------------+---------+-----------+----------+--------------+   +---------+---------------+---------+-----------+----------+--------------+ LEFT     CompressibilityPhasicitySpontaneityPropertiesThrombus Aging  +---------+---------------+---------+-----------+----------+--------------+ CFV      Full           Yes      Yes                                 +---------+---------------+---------+-----------+----------+--------------+ SFJ      Full                                                        +---------+---------------+---------+-----------+----------+--------------+ FV Prox  Full                                                        +---------+---------------+---------+-----------+----------+--------------+ FV Mid   Full                                                        +---------+---------------+---------+-----------+----------+--------------+  FV DistalFull                                                        +---------+---------------+---------+-----------+----------+--------------+ PFV      Full                                                        +---------+---------------+---------+-----------+----------+--------------+ POP      Full           Yes      Yes                                 +---------+---------------+---------+-----------+----------+--------------+ PTV      Full                                                        +---------+---------------+---------+-----------+----------+--------------+ PERO     Full                                                        +---------+---------------+---------+-----------+----------+--------------+ Gastroc  Full                                                        +---------+---------------+---------+-----------+----------+--------------+     Summary: RIGHT: - There is no evidence of deep vein thrombosis in the lower extremity.  - No cystic structure found in the popliteal fossa.  LEFT: - There is no evidence of deep vein thrombosis in the lower extremity.  - No cystic structure found in the popliteal fossa.  *See table(s) above for measurements and observations. Electronically signed  by Heath Lark on 10/09/2022 at 5:38:22 PM.    Final     ECHOCARDIOGRAM COMPLETE BUBBLE STUDY   Result Date: 10/09/2022    ECHOCARDIOGRAM REPORT   Patient Name:   MARTAVIOUS HARTEL Date of Exam: 10/09/2022 Medical Rec #:  [id number]        Height:       71.0 in Accession #:    [id number]       Weight:       222.7 lb Date of Birth:  1950-07-09        BSA:          2.207 m Patient Age:    71 years         BP:           111/59 mmHg Patient Gender: M                HR:           62 bpm. Exam Location:  Inpatient Procedure:  2D Echo, Cardiac Doppler, Color Doppler and Saline Contrast Bubble            Study Indications:    Stroke  History:        Patient has prior history of Echocardiogram examinations. CHF,                 Abnormal ECG, Stroke and Pulmonary HTN, Signs/Symptoms:Syncope;                 Risk Factors:Diabetes, Dyslipidemia, Sleep Apnea and                 Hypertension. Cancer.  Sonographer:    Sheralyn Boatman RDCS Referring Phys: [id number] CAROLE N HALL IMPRESSIONS  1. Left ventricular ejection fraction, by estimation, is 55 to 60%. The left ventricle has normal function. The left ventricle has no regional wall motion abnormalities. Left ventricular diastolic parameters are consistent with Grade I diastolic dysfunction (impaired relaxation).  2. Right ventricular systolic function is normal. The right ventricular size is normal. There is normal pulmonary artery systolic pressure. The estimated right ventricular systolic pressure is 29.4 mmHg.  3. The mitral valve is normal in structure. No evidence of mitral valve regurgitation. No evidence of mitral stenosis.  4. The aortic valve is tricuspid. There is moderate calcification of the aortic valve. There is moderate thickening of the aortic valve. Aortic valve regurgitation is not visualized. Aortic valve sclerosis/calcification is present, without any evidence of aortic stenosis.  5. The inferior vena cava is normal in size with greater than 50% respiratory  variability, suggesting right atrial pressure of 3 mmHg.  6. Agitated saline contrast bubble study was negative, with no evidence of any interatrial shunt. Comparison(s): No significant change from prior study. Conclusion(s)/Recommendation(s): No intracardiac source of embolism detected on this transthoracic study. Consider a transesophageal echocardiogram to exclude cardiac source of embolism if clinically indicated. FINDINGS  Left Ventricle: Left ventricular ejection fraction, by estimation, is 55 to 60%. The left ventricle has normal function. The left ventricle has no regional wall motion abnormalities. The left ventricular internal cavity size was normal in size. There is  no left ventricular hypertrophy. Left ventricular diastolic parameters are consistent with Grade I diastolic dysfunction (impaired relaxation). Right Ventricle: The right ventricular size is normal. No increase in right ventricular wall thickness. Right ventricular systolic function is normal. There is normal pulmonary artery systolic pressure. The tricuspid regurgitant velocity is 2.57 m/s, and  with an assumed right atrial pressure of 3 mmHg, the estimated right ventricular systolic pressure is 29.4 mmHg. Left Atrium: Left atrial size was normal in size. Right Atrium: Right atrial size was normal in size. Pericardium: There is no evidence of pericardial effusion. Mitral Valve: The mitral valve is normal in structure. No evidence of mitral valve regurgitation. No evidence of mitral valve stenosis. Tricuspid Valve: The tricuspid valve is normal in structure. Tricuspid valve regurgitation is mild. Aortic Valve: The aortic valve is tricuspid. There is moderate calcification of the aortic valve. There is moderate thickening of the aortic valve. Aortic valve regurgitation is not visualized. Aortic valve sclerosis/calcification is present, without any  evidence of aortic stenosis. Aortic valve mean gradient measures 7.0 mmHg. Aortic valve peak  gradient measures 13.8 mmHg. Aortic valve area, by VTI measures 2.81 cm. Pulmonic Valve: The pulmonic valve was normal in structure. Pulmonic valve regurgitation is trivial. Aorta: The aortic root is normal in size and structure. Venous: The inferior vena cava is normal in size with greater than 50% respiratory  variability, suggesting right atrial pressure of 3 mmHg. IAS/Shunts: The atrial septum is grossly normal. Agitated saline contrast was given intravenously to evaluate for intracardiac shunting. Agitated saline contrast bubble study was negative, with no evidence of any interatrial shunt.  LEFT VENTRICLE PLAX 2D LVIDd:         5.60 cm   Diastology LVIDs:         3.60 cm   LV e' medial:    10.90 cm/s LV PW:         0.80 cm   LV E/e' medial:  7.9 LV IVS:        1.00 cm   LV e' lateral:   14.90 cm/s LVOT diam:     2.40 cm   LV E/e' lateral: 5.8 LV SV:         114 LV SV Index:   52 LVOT Area:     4.52 cm  RIGHT VENTRICLE             IVC RV S prime:     14.30 cm/s  IVC diam: 2.00 cm TAPSE (M-mode): 1.6 cm LEFT ATRIUM             Index        RIGHT ATRIUM          Index LA diam:        3.80 cm 1.72 cm/m   RA Area:     8.98 cm LA Vol (A2C):   28.0 ml 12.69 ml/m  RA Volume:   13.00 ml 5.89 ml/m LA Vol (A4C):   23.4 ml 10.60 ml/m LA Biplane Vol: 26.1 ml 11.83 ml/m  AORTIC VALVE AV Area (Vmax):    2.78 cm AV Area (Vmean):   2.79 cm AV Area (VTI):     2.81 cm AV Vmax:           185.50 cm/s AV Vmean:          122.500 cm/s AV VTI:            0.406 m AV Peak Grad:      13.8 mmHg AV Mean Grad:      7.0 mmHg LVOT Vmax:         114.00 cm/s LVOT Vmean:        75.600 cm/s LVOT VTI:          0.252 m LVOT/AV VTI ratio: 0.62  AORTA Ao Root diam: 3.40 cm Ao Asc diam:  3.50 cm MITRAL VALVE               TRICUSPID VALVE MV Area (PHT): 2.99 cm    TR Peak grad:   26.4 mmHg MV Decel Time: 254 msec    TR Vmax:        257.00 cm/s MV E velocity: 86.50 cm/s MV A velocity: 85.20 cm/s  SHUNTS MV E/A ratio:  1.02        Systemic  VTI:  0.25 m                            Systemic Diam: 2.40 cm Laurance Flatten MD Electronically signed by Laurance Flatten MD Signature Date/Time: 10/09/2022/2:56:29 PM    Final     CT ANGIO HEAD NECK W WO CM   Result Date: 10/08/2022 CLINICAL DATA:  Acute infarcts in the medial parietal lobe bilaterally. EXAM: CT ANGIOGRAPHY HEAD AND NECK WITH AND WITHOUT CONTRAST TECHNIQUE: Multidetector CT imaging of the head and  neck was performed using the standard protocol during bolus administration of intravenous contrast. Multiplanar CT image reconstructions and MIPs were obtained to evaluate the vascular anatomy. Carotid stenosis measurements (when applicable) are obtained utilizing NASCET criteria, using the distal internal carotid diameter as the denominator. RADIATION DOSE REDUCTION: This exam was performed according to the departmental dose-optimization program which includes automated exposure control, adjustment of the mA and/or kV according to patient size and/or use of iterative reconstruction technique. CONTRAST:  75mL OMNIPAQUE IOHEXOL 350 MG/ML SOLN COMPARISON:  No prior CTA available, correlation is made with 10/08/2022 MRI head and 07/23/2017 CT head FINDINGS: CT HEAD FINDINGS Brain: No CT correlate is seen for the small acute infarcts noted on the same-day MRI. No evidence of additional acute infarct, hemorrhage, mass, mass effect, or midline shift. No hydrocephalus or extra-axial fluid collection. Vascular: No hyperdense vessel. Skull: Negative for fracture or focal lesion. Sinuses/Orbits: Mucosal thickening in the ethmoid air cells. No acute finding in the orbits. Other: The mastoid air cells are well aerated. CTA NECK FINDINGS Aortic arch: Two-vessel arch with a common origin of the brachiocephalic and left common carotid arteries. Imaged portion shows no evidence of aneurysm or dissection. No significant stenosis of the major arch vessel origins. Right carotid system: No evidence of dissection,  occlusion, or hemodynamically significant stenosis (greater than 50%). Left carotid system: No evidence of dissection, occlusion, or hemodynamically significant stenosis (greater than 50%). Vertebral arteries: No evidence of dissection, occlusion, or hemodynamically significant stenosis (greater than 50%). Skeleton: No acute osseous abnormality. Status post ACDF C5-C7 and posterior fusion C4-C7. Other neck: Redemonstrated 19 mm right thyroid nodule, which appears unchanged on exams dating back to 2014, favored to be benign. Patent Upper chest: No focal pulmonary opacity or pleural effusion. Review of the MIP images confirms the above findings CTA HEAD FINDINGS Anterior circulation: Both internal carotid arteries are patent to the termini, without significant stenosis. Left A1. Aplastic right A1. Normal anterior communicating artery. Asymmetrically narrow right A3 (series 14, images 231-232), which could represent stenosis versus asymmetric branching. Anterior cerebral arteries are otherwise patent to their distal aspects without significant stenosis. No M1 stenosis or occlusion. MCA branches perfused to their distal aspects without significant stenosis. Posterior circulation: Vertebral arteries patent to the vertebrobasilar junction without significant stenosis. Posterior inferior cerebellar arteries patent proximally. Basilar patent to its distal aspect without significant stenosis. Superior cerebellar arteries patent proximally. Patent P1 segments. PCAs perfused to their distal aspects without significant stenosis. The bilateral posterior communicating arteries are not visualized. Venous sinuses: Well opacified, patent. Anatomic variants: None significant. Review of the MIP images confirms the above findings IMPRESSION: 1. No intracranial large vessel occlusion. Asymmetrically narrow right A3, which could represent stenosis versus asymmetric branching. 2. No hemodynamically significant stenosis in the neck.  Electronically Signed   By: Wiliam Ke M.D.   On: 10/08/2022 23:18    MR Brain Wo Contrast (neuro protocol)   Result Date: 10/08/2022 CLINICAL DATA:  Neuro deficit, acute, stroke suspected EXAM: MRI HEAD WITHOUT CONTRAST TECHNIQUE: Multiplanar, multiecho pulse sequences of the brain and surrounding structures were obtained without intravenous contrast. COMPARISON:  CT Head 07/23/17 FINDINGS: Brain: There are acute infarcts in the bilateral ACA territory, left-greater-than-right involving the parasagittal bilateral parietal lobes (series 9, image 30-27). No hemorrhage. No extra-axial fluid collection. No hydrocephalus. Sequela of mild overall chronic microvascular ischemic change. No mass effect. No midline shift. Vascular: Normal flow voids. Skull and upper cervical spine: Normal marrow signal. Sinuses/Orbits: No mastoid or middle  ear effusion. Paranasal sinuses are notable for mucosal thickening in the bilateral ethmoid sinuses. Orbits are unremarkable. Other: None. IMPRESSION: Acute bilateral ACA territory infarcts (left-greater-than-right) involving the parasagittal aspects of the bilateral parietal lobes. No hemorrhage. Electronically Signed   By: Lorenza Cambridge M.D.   On: 10/08/2022 18:31           Blood pressure 112/67, pulse 66, temperature 98.6 F (37 C), temperature source Oral, resp. rate 16, height 5\' 11"  (1.803 m), weight 94.7 kg, SpO2 95 %.   Medical Problem List and Plan: 1. 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  Antithrombotics: -DVT/anticoagulation:  Pharmaceutical: Lovenox             -antiplatelet therapy: DAPT X 3 weeks followed by Plavix alone.  3. Pain Management:  N/A. Tylenol prn available.  4. Mood/Behavior/Sleep: LCSW to follow for evaluation  and support.             --Trazodone prn for insomnia.              -antipsychotic agents: N/A 5. Neuropsych/cognition: This patient is capable of making decisions on his own behalf. 6. Skin/Wound Care: Routine pressure relief measures.  7. Fluids/Electrolytes/Nutrition: Monitor I/O. Appetite low likely due to [facility] but likes it that way.             --Check CMET on 06/03.  8. T2DM: Hgb A1C-5.6 and well controlled. Was on metformin, Monjaro and Insulin glargine. Tends to skip meals.              --Continue to hold metformin. Monjaro not on formulary.  --SSI for elevated BS--currently 79- 101 range.  9. HTN: Monitor BP TID.  Intermittent bradycardia HR in high 50's.  --Continue hold Benazepril and [place] as BP controlled off meds.  10. Dyslipidemia Hx: Will decrease Lipitor to home dose of 40 mg. LDL-45 HDL- 18 (lower despite statin).  11. OSA: Has declined CPAP 12. Prostate CA: Resume Flomax 13. Anemia/Hx of iron deficiency: Will check anemia panel.  --H/H stable in 12 range.     Jacquelynn Cree, PA-C  I have examined the patient independently and edited the note for HPI, ROS, exam, assessment, and plan as appropriate. I am in agreement with the above recommendations.   Hershal Coria, PsyD 11/02/2022

## 2022-11-02 NOTE — Consult Note (Addendum)
Completed clinical visit

## 2022-11-04 ENCOUNTER — Ambulatory Visit: Payer: Medicare Other

## 2022-11-04 DIAGNOSIS — M6281 Muscle weakness (generalized): Secondary | ICD-10-CM | POA: Diagnosis not present

## 2022-11-04 DIAGNOSIS — R262 Difficulty in walking, not elsewhere classified: Secondary | ICD-10-CM

## 2022-11-04 DIAGNOSIS — R2681 Unsteadiness on feet: Secondary | ICD-10-CM

## 2022-11-04 DIAGNOSIS — R2689 Other abnormalities of gait and mobility: Secondary | ICD-10-CM

## 2022-11-04 NOTE — Therapy (Signed)
OUTPATIENT PHYSICAL THERAPY NEURO TREATMENT   Patient Name: Kenneth [name] MRN: [id number] DOB:[birth year], 72 y.o., male Today's Date: 11/04/2022   PCP: [name], MD REFERRING PROVIDER: Charlton Amor, PA-C  END OF SESSION:  PT End of Session - 11/04/22 1447     Visit Number 3    Number of Visits 4    Date for PT Re-Evaluation 11/18/22    Authorization Type Medicare/Medicaid    PT Start Time 1445    PT Stop Time 1530    PT Time Calculation (min) 45 min             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   History of colon polyps - adenomas 04/03/2010   Hyperlipidemia    Hypertension    Iron deficiency anemia, unspecified    OSA (obstructive sleep apnea)    refusing CPAP   Pneumonia 03/2022   w/effusion   Prostate cancer [facility])    Past Surgical History:  Procedure Laterality Date   ANTERIOR CERVICAL DECOMP/DISCECTOMY FUSION N/A 10/01/2012   Procedure: ANTERIOR CERVICAL DECOMPRESSION/DISCECTOMY FUSION 2 LEVELS;  Surgeon: Tia Alert, MD;  Location: [facility] NEURO ORS;  Service: Neurosurgery;  Laterality: N/A;  Cervical five-six,Cervical six-seven   COLONOSCOPY  09/30/2010, 03/01/2014   diverticulosis, small internal hemorrhoids   COLONOSCOPY W/ POLYPECTOMY  04/03/2010   4 polyps 8-54mm, worst TV adenoma with high-grade dysplasia   LOOP RECORDER INSERTION N/A 10/10/2022   Procedure: LOOP RECORDER INSERTION;  Surgeon: Marinus Maw, MD;  Location: [facility] INVASIVE CV LAB;  Service: Cardiovascular;  Laterality: N/A;   NO PAST SURGERIES     POLYPECTOMY     POSTERIOR CERVICAL FUSION/FORAMINOTOMY Left 10/20/2012   Procedure: C/5-6,C/6-7 Lami/Multi level,POSTERIOR CERVICAL FUSION/FORAMINOTOMY C/4-7;  Surgeon: Tia Alert, MD;  Location: [facility] NEURO ORS;  Service: Neurosurgery;  Laterality:  Left;  Cervical Five-Six, Cervical Six-Seven Laminectomies, Posterior Cervical Fusion/Foraminotomies Cervical Four through Seven.    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   Syncope 07/23/2017   Leukocytosis 07/23/2017   Hypokalemia 07/23/2017   UTI (urinary tract infection) 07/23/2017   Controlled type 2 diabetes mellitus without complication, without long-term current use of insulin (HCC) 02/10/2017   Mixed hyperlipidemia 02/10/2017   Hearing loss 02/10/2017   OSA (obstructive sleep apnea) 02/10/2017   History of colon polyps - adenomas 04/03/2010    ONSET DATE: 10/08/22  REFERRING DIAG:  I63.9 (ICD-10-CM) - Cerebral infarction, unspecified    THERAPY DIAG:  Difficulty in walking, not elsewhere classified  Muscle weakness (generalized)  Other abnormalities of gait and mobility  Unsteadiness on feet  Rationale for Evaluation and Treatment: Rehabilitation  SUBJECTIVE:  SUBJECTIVE STATEMENT: Able to walk neighborhood at 3.2 mph  Pt accompanied by: family member  PERTINENT HISTORY: 72 y.o. male with history of T2DM, prostate cancer, congenital hearing loss, HTN who was admitted on 10/08/2022 after awakening in a.m. with weakness LLE greater than RLE as well as difficulty walking.  MRI brain done revealing acute bilateral ACA territory infarcts left greater than right.  PAIN:  Are you having pain? No  PRECAUTIONS:  None  WEIGHT BEARING RESTRICTIONS: No  FALLS: Has patient fallen in last 6 months? Yes. Number of falls 1, day of stroke  LIVING ENVIRONMENT: Lives with: lives with their family Lives in: House/apartment Stairs: Yes: Internal: 1-2 steps; none Has following equipment at home: Single point cane  PLOF: Independent  PATIENT GOALS: 1) be able to drive again. 2) not to get so stressed  OBJECTIVE:   TODAY'S TREATMENT: 11/04/22 Activity Comments  Treadmill training x 6 min For gait speed 2.6-2.8 mph  Corner balance activities   Multi-sensory balance   Dynamic balance   Dynamic gait Divided attention with walking, negotiating obstacles, sudden start/stop        TODAY'S TREATMENT: 10/28/22 Activity Comments  Pt education General fitness recommendations  HEP review   Lateral step-ups 2x10 6" step  Forward step-up 2x10 BUE support 10" step  Lift-off 2x10 6" step for SLS  Standing on foam -Eo/EC x 30 sec -head turns EO/EC 3x  Tandem stance 1x30 sec        PATIENT EDUCATION: Education details: assessment details/findings, rationale of PT intervention Person educated: Patient Education method: Explanation Education comprehension: verbalized understanding and needs further education  HOME EXERCISE PROGRAM: Access Code: [email or web address] URL: [email or web address] Date: 10/21/2022 Prepared by: [name]  Exercises - Sit to Stand with Resistance Around Legs  - 1 x daily - 7 x weekly - 3 sets - 10 reps - Side Stepping with Resistance at Ankles and Counter Support  - 1 x daily - 7 x weekly - 2-3 sets - 1-2 min rounds hold - Lateral Step Up with Counter Support  - 1 x daily - 7 x weekly - 3 sets - 10 reps - Forward Step Up with Counter Support  - 1 x daily - 7 x weekly - 3 sets - 10 reps - Standing Foot Lift on Box (BKA)  - 1 x daily - 7 x weekly - 3 sets - 10 reps Access Code: [email or web address] URL: [email or web address] Date: 11/04/2022 Prepared by: [name]   - Corner Balance Feet Together With Eyes Open  - 1 x daily - 7 x weekly - 3 sets - 30 sec hold - Corner Balance Feet Together With Eyes Closed  - 1 x daily - 7 x weekly - 3 sets - 30 sec hold - Corner Balance Feet Together: Eyes Open With Head Turns  - 1 x daily - 7 x weekly - 3 sets - 3 reps - Corner Balance Feet Together: Eyes Closed With Head Turns  - 1 x daily - 7 x weekly - 3 sets - 3 reps - Semi-Tandem Corner Balance With Eyes Open  - 1 x daily - 7 x weekly  DIAGNOSTIC FINDINGS:  IMPRESSION: 1. No intracranial large vessel occlusion. Asymmetrically narrow right A3, which could represent stenosis versus asymmetric branching. 2. No hemodynamically significant stenosis in the neck.  IMPRESSION: Acute bilateral ACA territory infarcts (left-greater-than-right) involving the parasagittal aspects of the bilateral parietal lobes. No hemorrhage.  COGNITION: Overall cognitive status: Within functional limits for tasks assessed  SENSATION: WFL  COORDINATION: Able to perform heel to shin Able to perform rapid alternating movements   EDEMA:  BLE edema (chronic)  MUSCLE TONE:   MUSCLE LENGTH: WNL   POSTURE: No Significant postural limitations  LOWER EXTREMITY ROM:     WNL  LOWER EXTREMITY MMT:    MMT Right Eval Left Eval  Hip flexion 4 5  Hip extension    Hip abduction 3+ 5  Hip adduction    Hip internal rotation    Hip external rotation    Knee flexion 4 5  Knee extension 5 5  Ankle dorsiflexion 5 5  Ankle plantarflexion    Ankle inversion    Ankle eversion    (Blank rows = not tested)  BED MOBILITY:  independent  TRANSFERS: Assistive device utilized: None  Sit to stand: Complete Independence Stand to sit: Complete Independence Chair to chair: Complete Independence Floor:  NT    CURB:  Level of Assistance: Complete Independence Assistive device utilized: None Curb Comments:   STAIRS: NT  GAIT: Gait pattern: WFL and decreased  stride length Distance walked:  Assistive device utilized: None Level of assistance: Complete Independence Comments:   FUNCTIONAL TESTS:  5 times sit to stand: 13 sec Timed up and go (TUG): 11.5 sec and 11.5 sec TUG cog Berg Balance Scale: 48/56  M-CTSIB  Condition 1: Firm Surface, EO 30 Sec, Mild Sway  Condition 2: Firm Surface, EC 10 Sec, Moderate Sway  Condition 3: Foam Surface, EO 30 Sec, Mild Sway  Condition 4: Foam Surface, EC 10 Sec, Severe Sway       TODAY'S TREATMENT:                                                                                                                              DATE: 10/21/22      GOALS: Goals reviewed with patient? Yes  SHORT TERM GOALS: Target date: same as LTG    LONG TERM GOALS: Target date: 11/18/2022    Patient will be independent in HEP to improve functional outcomes Baseline:  Goal status: INITIAL  2.  Improve postural stability and reduce risk for falls during ADL per mild-moderate sway condition 4 M-CTSIB x 15 sec Baseline: severe/LOB Goal status: INITIAL  3.  Demo improved right single limb support and strength via ability to perform 5 sec single leg stance Baseline: unable Goal status: INITIAL  4.  Improve right hip abduction strength to 4/5 to reduce hip drop and improve single leg stability Baseline: 3+/5 Goal status: INITIAL  5. Demo improved balance and reduce risk for falls per score 52/56 Berg Balance Test  Baseline: 48/56  Goal status: INITIAL    ASSESSMENT:  CLINICAL IMPRESSION: Introduced treadmill training to prepare for return to gym-based activities. Notes fatigue/SOB with 2.8 mph, improved with speed at 2.6.  Multi-sensory balance activities to improve postural stability and improve balance under various conditions. Most difficulty with eyes closed and head movement  conditions.  Addition to HEP to address balance deficits. Continued sessions x 1 visit for re-assessment and determine continue vs  D/C  OBJECTIVE IMPAIRMENTS: Abnormal gait, decreased activity tolerance, decreased balance, decreased mobility, difficulty walking, and decreased strength.   ACTIVITY LIMITATIONS: carrying, lifting, bending, and locomotion level  PARTICIPATION LIMITATIONS: driving, shopping, community activity, occupation, and yard work  PERSONAL FACTORS: Age, Time since onset of injury/illness/exacerbation, and 1-2 comorbidities: PMH  are also affecting patient's functional outcome.   REHAB POTENTIAL: Excellent  CLINICAL DECISION MAKING: Stable/uncomplicated  EVALUATION COMPLEXITY: Low  PLAN:  PT FREQUENCY: 1x/week  PT DURATION: 4 weeks  PLANNED INTERVENTIONS: Therapeutic exercises, Therapeutic activity, Neuromuscular re-education, Balance training, Gait training, Patient/Family education, Self Care, Joint mobilization, Stair training, Vestibular training, Canalith repositioning, Orthotic/Fit training, DME instructions, Aquatic Therapy, Dry Needling, Spinal mobilization, and Manual therapy  PLAN FOR NEXT SESSION: HEP review, corner balance for HEP, additional hip strength with blue t-loop   2:47 PM, 11/04/22 M. [name], PT, DPT Physical Therapist- [place] Office Number: [phone]

## 2022-11-06 ENCOUNTER — Inpatient Hospital Stay: Payer: Medicare Other | Admitting: Physical Medicine and Rehabilitation

## 2022-11-07 ENCOUNTER — Encounter
Payer: Medicare Other | Attending: Physical Medicine and Rehabilitation | Admitting: Physical Medicine and Rehabilitation

## 2022-11-07 ENCOUNTER — Encounter: Payer: Self-pay | Admitting: Physical Medicine and Rehabilitation

## 2022-11-07 VITALS — BP 170/77 | Ht 71.0 in | Wt 218.0 lb

## 2022-11-07 DIAGNOSIS — I10 Essential (primary) hypertension: Secondary | ICD-10-CM | POA: Diagnosis not present

## 2022-11-07 DIAGNOSIS — I639 Cerebral infarction, unspecified: Secondary | ICD-10-CM | POA: Insufficient documentation

## 2022-11-07 NOTE — Progress Notes (Signed)
Subjective:    Patient ID: Kenneth [name], male    DOB: June 27, 1950, 72 y.o.   MRN: [id number]  HPI Kenneth [name] is a 72 year old man who presents for follow-up of CVA  1) CVA -has one more therapy session  -discussed that wife is not doing well  2) HTN: -discussed that it is elevated in office but has been stable at home -he feels that he has white coat syndrome   Pain Inventory Average Pain 0 Pain Right Now 0 My pain is  no pain  LOCATION OF PAIN  No pain  BOWEL Number of stools per week: 7 Oral laxative use No   BLADDER Normal    Mobility walk without assistance ability to climb steps?  yes do you drive?  yes Do you have any goals in this area?  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  no  Physicians involved in your care Any changes since last visit?  no   Family History  Problem Relation Age of Onset   Diabetes Mother    Stomach cancer Neg Hx    Rectal cancer Neg Hx    Pancreatic cancer Neg Hx    Colon cancer Neg Hx    Colon polyps Neg Hx    Esophageal cancer Neg Hx    Prostate cancer Neg Hx    Breast cancer Neg Hx    Social History   Socioeconomic History   Marital status: Married    Spouse name: Not on file   Number of children: Not on file   Years of education: Not on file   Highest education level: Not on file  Occupational History   Not on file  Tobacco Use   Smoking status: Former    Packs/day: 0.25    Years: 2.00    Additional pack years: 0.00    Total pack years: 0.50    Types: Cigarettes    Quit date: 09/22/1969    Years since quitting: 53.1   Smokeless tobacco: Never  Vaping Use   Vaping Use: Never used  Substance and Sexual Activity   Alcohol use: No   Drug use: No   Sexual activity: Yes  Other Topics Concern   Not on file  Social History Narrative   Not on file   Social Determinants of Health   Financial  Resource Strain: Not on file  Food Insecurity: No Food Insecurity (10/08/2022)   Hunger Vital Sign    Worried About Running Out of Food in the Last Year: Never true    Ran Out of Food in the Last Year: Never true  Transportation Needs: No Transportation Needs (10/08/2022)   PRAPARE - Administrator, Civil Service (Medical): No    Lack of Transportation (Non-Medical): No  Physical Activity: Not on file  Stress: Not on file  Social Connections: Not on file   Past Surgical History:  Procedure Laterality Date   ANTERIOR CERVICAL DECOMP/DISCECTOMY FUSION N/A 10/01/2012   Procedure: ANTERIOR CERVICAL DECOMPRESSION/DISCECTOMY FUSION 2 LEVELS;  Surgeon: Tia Alert, MD;  Location: [facility];  Service: Neurosurgery;  Laterality: N/A;  Cervical five-six,Cervical six-seven   COLONOSCOPY  09/30/2010, 03/01/2014   diverticulosis, small internal hemorrhoids   COLONOSCOPY W/ POLYPECTOMY  04/03/2010   4 polyps 8-51mm, worst TV adenoma with high-grade dysplasia   LOOP RECORDER INSERTION N/A 10/10/2022   Procedure: LOOP  RECORDER INSERTION;  Surgeon: Marinus Maw, MD;  Location: [facility] INVASIVE CV LAB;  Service: Cardiovascular;  Laterality: N/A;   NO PAST SURGERIES     POLYPECTOMY     POSTERIOR CERVICAL FUSION/FORAMINOTOMY Left 10/20/2012   Procedure: C/5-6,C/6-7 Lami/Multi level,POSTERIOR CERVICAL FUSION/FORAMINOTOMY C/4-7;  Surgeon: Tia Alert, MD;  Location: [facility];  Service: Neurosurgery;  Laterality: Left;  Cervical Five-Six, Cervical Six-Seven Laminectomies, Posterior Cervical Fusion/Foraminotomies Cervical Four through Seven.    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   History of colon polyps - adenomas 04/03/2010   Hyperlipidemia    Hypertension    Iron deficiency  anemia, unspecified    OSA (obstructive sleep apnea)    refusing CPAP   Pneumonia 03/2022   w/effusion   Prostate cancer (HCC)    BP (!) 166/79   Ht 5\' 11"  (1.803 m)   Wt 218 lb (98.9 kg)   SpO2 97%   BMI 30.40 kg/m   Opioid Risk Score:   Fall Risk Score:  `1  Depression screen [facility] 2/9     11/07/2022   10:59 AM  Depression screen PHQ 2/9  Decreased Interest 0  Down, Depressed, Hopeless 0  PHQ - 2 Score 0  Altered sleeping 0  Tired, decreased energy 0  Change in appetite 0  Feeling bad or failure about yourself  0  Trouble concentrating 0  Moving slowly or fidgety/restless 0  Suicidal thoughts 0  PHQ-9 Score 0    Review of Systems  All other systems reviewed and are negative.     Objective:   Physical Exam Gen: no distress, normal appearing HEENT: oral mucosa pink and moist, NCAT Cardio: Reg rate Chest: normal effort, normal rate of breathing Abd: soft, non-distended Ext: no edema Psych: pleasant, normal affect Skin: intact Neuro: Alert and oriented x3    Assessment & Plan:  1) CVA -discussed completing PT soon -discussed that his wife's health is a stessor   2) HTN: -BP is 170/77 today.  -discussed white coat syndrome -discussed that his BP medications were changed prior to his stroke -discussed that he is on 20mg  of Lotensin -restart chlorthalidone -discussed that daily readings at home have been excellent  -Advised checking BP daily at home and logging results to bring into follow-up appointment with PCP and myself. -Reviewed BP meds today.  -Advised regarding healthy foods that can help lower blood pressure and provided with a list: 1) citrus foods- high in vitamins and minerals 2) salmon and other fatty fish - reduces inflammation and oxylipins 3) swiss chard (leafy green)- high level of nitrates 4) pumpkin seeds- one of the best natural sources of magnesium 5) Beans and lentils- high in fiber, magnesium, and potassium 6) Berries- high in  flavonoids 7) Amaranth (whole grain, can be cooked similarly to rice and oats)- high in magnesium and fiber 8) Pistachios- even more effective at reducing BP than other nuts 9) Carrots- high in phenolic compounds that relax blood vessels and reduce inflammation 10) Celery- contain phthalides that relax tissues of arterial walls 11) Tomatoes- can also improve cholesterol and reduce risk of heart disease 12) Broccoli- good source of magnesium, calcium, and potassium 13) Greek yogurt: high in potassium and calcium 14) Herbs  and spices: Celery seed, cilantro, saffron, lemongrass, black cumin, ginseng, cinnamon, cardamom, sweet basil, and ginger 15) Chia and flax seeds- also help to lower cholesterol and blood sugar 16) Beets- high levels of nitrates that relax blood vessels  17) spinach and bananas- high in potassium  -Provided lise of supplements that can help with hypertension:  1) magnesium: one high quality brand is Bioptemizers since it contains all 7 types of magnesium, otherwise over the counter magnesium gluconate 400mg  is a good option 2) B vitamins 3) vitamin D 4) potassium 5) CoQ10 6) L-arginine 7) Vitamin C 8) Beetroot -Educated that goal BP is 120/80. -Made goal to incorporate some of the above foods into diet.

## 2022-11-10 ENCOUNTER — Ambulatory Visit (HOSPITAL_COMMUNITY)
Admission: RE | Admit: 2022-11-10 | Discharge: 2022-11-10 | Disposition: A | Discharge status: Home / Self Care | Payer: Medicare Other | Source: Ambulatory Visit | Attending: Physician Assistant | Admitting: Physician Assistant

## 2022-11-10 DIAGNOSIS — I639 Cerebral infarction, unspecified: Secondary | ICD-10-CM | POA: Insufficient documentation

## 2022-11-11 ENCOUNTER — Ambulatory Visit: Payer: Medicare Other | Attending: Physician Assistant

## 2022-11-11 DIAGNOSIS — R2681 Unsteadiness on feet: Secondary | ICD-10-CM | POA: Diagnosis not present

## 2022-11-11 DIAGNOSIS — R2689 Other abnormalities of gait and mobility: Secondary | ICD-10-CM | POA: Diagnosis not present

## 2022-11-11 DIAGNOSIS — M6281 Muscle weakness (generalized): Secondary | ICD-10-CM | POA: Diagnosis not present

## 2022-11-11 DIAGNOSIS — R262 Difficulty in walking, not elsewhere classified: Secondary | ICD-10-CM | POA: Diagnosis not present

## 2022-11-11 NOTE — Therapy (Signed)
OUTPATIENT PHYSICAL THERAPY NEURO TREATMENT and D/C Summary   Patient Name: Kenneth [name] MRN: [id number] DOB:October 22, 1950, 72 y.o., male Today's Date: 11/11/2022   PCP: [name], MD REFERRING PROVIDER: Charlton Amor, PA-C  PHYSICAL THERAPY DISCHARGE SUMMARY  Visits from Start of Care: 4  Current functional level related to goals / functional outcomes: 4/5 goals met   Remaining deficits: Right hip abduction weakness limiting single leg stance/support   Education / Equipment: HEP   Patient agrees to discharge. Patient goals were met. Patient is being discharged due to being pleased with the current functional level.   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   History of colon polyps - adenomas 04/03/2010   Hyperlipidemia    Hypertension    Iron deficiency anemia, unspecified    OSA (obstructive sleep apnea)    refusing CPAP   Pneumonia 03/2022   w/effusion   Prostate cancer [facility])    Past Surgical History:  Procedure Laterality Date   ANTERIOR CERVICAL DECOMP/DISCECTOMY FUSION N/A 10/01/2012   Procedure: ANTERIOR CERVICAL DECOMPRESSION/DISCECTOMY FUSION 2 LEVELS;  Surgeon: Tia Alert, MD;  Location: [facility];  Service: Neurosurgery;  Laterality: N/A;  Cervical five-six,Cervical six-seven   COLONOSCOPY  09/30/2010, 03/01/2014   diverticulosis, small internal hemorrhoids   COLONOSCOPY W/ POLYPECTOMY  04/03/2010   4 polyps 8-62mm, worst TV adenoma with high-grade dysplasia   LOOP RECORDER INSERTION N/A  10/10/2022   Procedure: LOOP RECORDER INSERTION;  Surgeon: Marinus Maw, MD;  Location: [facility] INVASIVE CV LAB;  Service: Cardiovascular;  Laterality: N/A;   NO PAST SURGERIES     POLYPECTOMY     POSTERIOR CERVICAL FUSION/FORAMINOTOMY Left 10/20/2012   Procedure: C/5-6,C/6-7 Lami/Multi level,POSTERIOR CERVICAL FUSION/FORAMINOTOMY C/4-7;  Surgeon: Tia Alert, MD;  Location: [facility];  Service: Neurosurgery;  Laterality: Left;  Cervical Five-Six, Cervical Six-Seven Laminectomies, Posterior Cervical Fusion/Foraminotomies Cervical Four through Seven.    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   Syncope 07/23/2017   Leukocytosis 07/23/2017   Hypokalemia 07/23/2017   UTI (urinary tract infection) 07/23/2017   Controlled type 2 diabetes mellitus without complication, without long-term current use of insulin (HCC) 02/10/2017   Mixed hyperlipidemia 02/10/2017   Hearing loss 02/10/2017   OSA (obstructive sleep apnea) 02/10/2017   History of colon polyps - adenomas 04/03/2010    ONSET DATE: 10/08/22  REFERRING DIAG:  I63.9 (ICD-10-CM) - Cerebral infarction, unspecified    THERAPY DIAG:  Difficulty in walking, not  elsewhere classified  Muscle weakness (generalized)  Other abnormalities of gait and mobility  Unsteadiness on feet  Rationale for Evaluation and Treatment: Rehabilitation  SUBJECTIVE:                                                                                                                                                                                             SUBJECTIVE STATEMENT: No new  issues  Pt accompanied by: family member  PERTINENT HISTORY: 72 y.o. male with history of T2DM, prostate cancer, congenital hearing loss, HTN who was admitted on 10/08/2022 after awakening in a.m. with weakness LLE greater than RLE as well as difficulty walking.  MRI brain done revealing acute bilateral ACA territory infarcts left greater than right.  PAIN:  Are you having pain? No  PRECAUTIONS: None  WEIGHT BEARING RESTRICTIONS: No  FALLS: Has patient fallen in last 6 months? Yes. Number of falls 1, day of stroke  LIVING ENVIRONMENT: Lives with: lives with their family Lives in: House/apartment Stairs: Yes: Internal: 1-2 steps; none Has following equipment at home: Single point cane  PLOF: Independent  PATIENT GOALS: 1) be able to drive again. 2) not to get so stressed  OBJECTIVE:   TODAY'S TREATMENT: 11/11/22 Activity Comments  Berg Balance 53/56  M-CTSIB Mild-mod x 30 sec Condition 1-4  4 sec SLS on RLE   STG/LTG                PATIENT EDUCATION: Education details: assessment details/findings, rationale of PT intervention Person educated: Patient Education method: Explanation Education comprehension: verbalized understanding and needs further education  HOME EXERCISE PROGRAM: Access Code: [email or web address] URL: [email or web address] Date: 10/21/2022 Prepared by: [name]  Exercises - Sit to Stand with Resistance Around Legs  - 1 x daily - 7 x weekly - 3 sets - 10 reps - Side Stepping with Resistance at Ankles and Counter Support  - 1 x daily - 7 x weekly - 2-3 sets - 1-2 min rounds hold - Lateral Step Up with Counter Support  - 1 x daily - 7 x weekly - 3 sets - 10 reps - Forward Step Up with Counter Support  - 1 x daily - 7 x weekly - 3 sets - 10 reps - Standing Foot Lift on Box (BKA)  - 1 x daily - 7 x weekly - 3 sets - 10 reps Access Code: [email or web address] URL: [email or web address] Date: 11/04/2022 Prepared by: [name] - Corner  Balance Feet Together With Eyes Open  - 1 x daily - 7 x weekly - 3 sets - 30 sec hold - Corner Balance Feet Together With Eyes Closed  -  1 x daily - 7 x weekly - 3 sets - 30 sec hold - Corner Balance Feet Together: Eyes Open With Head Turns  - 1 x daily - 7 x weekly - 3 sets - 3 reps - Corner Balance Feet Together: Eyes Closed With Head Turns  - 1 x daily - 7 x weekly - 3 sets - 3 reps - Semi-Tandem Corner Balance With Eyes Open  - 1 x daily - 7 x weekly  DIAGNOSTIC FINDINGS:  IMPRESSION: 1. No intracranial large vessel occlusion. Asymmetrically narrow right A3, which could represent stenosis versus asymmetric branching. 2. No hemodynamically significant stenosis in the neck.  IMPRESSION: Acute bilateral ACA territory infarcts (left-greater-than-right) involving the parasagittal aspects of the bilateral parietal lobes. No hemorrhage.  COGNITION: Overall cognitive status: Within functional limits for tasks assessed   SENSATION: WFL  COORDINATION: Able to perform heel to shin Able to perform rapid alternating movements   EDEMA:  BLE edema (chronic)  MUSCLE TONE:   MUSCLE LENGTH: WNL   POSTURE: No Significant postural limitations  LOWER EXTREMITY ROM:     WNL  LOWER EXTREMITY MMT:    MMT Right Eval Left Eval  Hip flexion 4 5  Hip extension    Hip abduction 3+ 5  Hip adduction    Hip internal rotation    Hip external rotation    Knee flexion 4 5  Knee extension 5 5  Ankle dorsiflexion 5 5  Ankle plantarflexion    Ankle inversion    Ankle eversion    (Blank rows = not tested)  BED MOBILITY:  independent  TRANSFERS: Assistive device utilized: None  Sit to stand: Complete Independence Stand to sit: Complete Independence Chair to chair: Complete Independence Floor:  NT    CURB:  Level of Assistance: Complete Independence Assistive device utilized: None Curb Comments:   STAIRS: NT  GAIT: Gait pattern: WFL and decreased stride length Distance  walked:  Assistive device utilized: None Level of assistance: Complete Independence Comments:   FUNCTIONAL TESTS:  5 times sit to stand: 13 sec Timed up and go (TUG): 11.5 sec and 11.5 sec TUG cog Berg Balance Scale: 48/56  M-CTSIB  Condition 1: Firm Surface, EO 30 Sec, Mild Sway  Condition 2: Firm Surface, EC 10 Sec, Moderate Sway  Condition 3: Foam Surface, EO 30 Sec, Mild Sway  Condition 4: Foam Surface, EC 10 Sec, Severe Sway          GOALS: Goals reviewed with patient? Yes  SHORT TERM GOALS: Target date: same as LTG    LONG TERM GOALS: Target date: 11/18/2022    Patient will be independent in HEP to improve functional outcomes Baseline:  Goal status: MET  2.  Improve postural stability and reduce risk for falls during ADL per mild-moderate sway condition 4 M-CTSIB x 15 sec Baseline: severe/LOB Goal status: MET  3.  Demo improved right single limb support and strength via ability to perform 5 sec single leg stance Baseline: unable; 4 sec Goal status: NOT MET  4.  Improve right hip abduction strength to 4/5 to reduce hip drop and improve single leg stability Baseline: 3+/5 Goal status: MET  5. Demo improved balance and reduce risk for falls per score 52/56 Berg Balance Test  Baseline: 48/56; 5356  Goal status: MET    ASSESSMENT:  CLINICAL IMPRESSION: STG/LTG review and performance.  Demo marked improvement in Henderson Balance test from initial score and greater postural stability revealed via M-CTSIB.  Improved RLE single limb  stance control maintaining balance x 4 sec but with onset of right hip drop in position due to hip abd weakness.  Demo 4/5 right hip abd strength.  Pt reports overall improvement in status throughout his recovery and is walking independently and has resumed typical daily activities.  Reports he will be pursuing continued physical exercise at local gym. Able to demo teach-back of relevant details. Met 4/5 goals. D/C to HEP at this  time  OBJECTIVE IMPAIRMENTS: Abnormal gait, decreased activity tolerance, decreased balance, decreased mobility, difficulty walking, and decreased strength.   ACTIVITY LIMITATIONS: carrying, lifting, bending, and locomotion level  PARTICIPATION LIMITATIONS: driving, shopping, community activity, occupation, and yard work  PERSONAL FACTORS: Age, Time since onset of injury/illness/exacerbation, and 1-2 comorbidities: PMH  are also affecting patient's functional outcome.   REHAB POTENTIAL: Excellent  CLINICAL DECISION MAKING: Stable/uncomplicated  EVALUATION COMPLEXITY: Low  PLAN:  PT FREQUENCY: 1x/week  PT DURATION: 4 weeks  PLANNED INTERVENTIONS: Therapeutic exercises, Therapeutic activity, Neuromuscular re-education, Balance training, Gait training, Patient/Family education, Self Care, Joint mobilization, Stair training, Vestibular training, Canalith repositioning, Orthotic/Fit training, DME instructions, Aquatic Therapy, Dry Needling, Spinal mobilization, and Manual therapy  PLAN FOR NEXT SESSION: D/C to HEP   2:46 PM, 11/11/22 M. [name], PT, DPT Physical Therapist- [place] Office Number: [phone]

## 2022-11-14 ENCOUNTER — Ambulatory Visit (INDEPENDENT_AMBULATORY_CARE_PROVIDER_SITE_OTHER): Payer: Medicare Other

## 2022-11-14 DIAGNOSIS — I639 Cerebral infarction, unspecified: Secondary | ICD-10-CM | POA: Diagnosis not present

## 2022-11-14 DIAGNOSIS — E119 Type 2 diabetes mellitus without complications: Secondary | ICD-10-CM | POA: Diagnosis not present

## 2022-11-14 LAB — CUP PACEART REMOTE DEVICE CHECK
Date Time Interrogation Session: 20240705015156
Implantable Pulse Generator Implant Date: 20240531
Zone Setting Status: 755011
Zone Setting Status: 755011
Zone Setting Status: 755011
Zone Setting Status: 755011

## 2022-11-18 ENCOUNTER — Ambulatory Visit: Payer: Medicare Other

## 2022-11-22 ENCOUNTER — Other Ambulatory Visit: Payer: Self-pay | Admitting: Physical Medicine and Rehabilitation

## 2022-11-28 NOTE — Progress Notes (Signed)
[facility] Summary Report / Loop Recorder 

## 2022-12-16 NOTE — Progress Notes (Unsigned)
[facility] [address] [place]. [place] [address] [phone]       HOSPITAL FOLLOW UP NOTE  [name] Date of Birth:  [birth year] Medical Record Number:  [id number]   Reason for Referral:  hospital stroke follow up    SUBJECTIVE:   CHIEF COMPLAINT:  No chief complaint on file.   HPI:   Kenneth [name] is a 72 y.o. who  has a past medical history of Broken neck (HCC), Cervical spinal stenosis, Diabetes mellitus, Diverticulosis, Hearing loss of both ears, History of colon polyps - adenomas (04/03/2010), Hyperlipidemia, Hypertension, Iron deficiency anemia, unspecified, OSA (obstructive sleep apnea), Pneumonia (03/2022), and Prostate cancer (HCC).  Patient presented on 10/08/2022 with LLE > RLE weakness and difficulty waking. 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 DAPT x 3 weeks then Plavix alone advised.  Personally reviewed hospitalization pertinent progress notes, lab work and imaging.  Evaluated by ***.      PERTINENT IMAGING/LABS  MR BRAIN   CTA HEAD/NECK MRA HEAD/NECK   CAROTID ULTRASOUND   2D ECHO   TEE   A1C Lab Results  Component Value Date   HGBA1C 5.6 10/09/2022    Lipid Panel     Component Value Date/Time   CHOL 80 10/09/2022 0625   CHOL 159 02/10/2017 1307   TRIG 85 10/09/2022 0625   HDL 18 (L) 10/09/2022 0625   HDL 67 02/10/2017 1307   CHOLHDL 4.4 10/09/2022 0625   VLDL 17 10/09/2022 0625   LDLCALC 45 10/09/2022 0625   LDLCALC 58 02/10/2017 1307   LABVLDL 34 02/10/2017 1307      ROS:   14 system review of systems performed and negative with exception of those listed in HPI  PMH:  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   History of colon polyps - adenomas 04/03/2010   Hyperlipidemia    Hypertension     Iron deficiency anemia, unspecified    OSA (obstructive sleep apnea)    refusing CPAP   Pneumonia 03/2022   w/effusion   Prostate cancer (HCC)     PSH:  Past Surgical History:  Procedure Laterality Date   ANTERIOR CERVICAL DECOMP/DISCECTOMY FUSION N/A 10/01/2012   Procedure: ANTERIOR CERVICAL DECOMPRESSION/DISCECTOMY FUSION 2 LEVELS;  Surgeon: Tia Alert, MD;  Location: [facility];  Service: Neurosurgery;  Laterality: N/A;  Cervical five-six,Cervical six-seven   COLONOSCOPY  09/30/2010, 03/01/2014   diverticulosis, small internal hemorrhoids   COLONOSCOPY W/ POLYPECTOMY  04/03/2010   4 polyps 8-9mm, worst TV adenoma with high-grade dysplasia   LOOP RECORDER INSERTION N/A 10/10/2022   Procedure: LOOP RECORDER INSERTION;  Surgeon: Marinus Maw, MD;  Location: [facility];  Service: Cardiovascular;  Laterality: N/A;   NO PAST SURGERIES     POLYPECTOMY     POSTERIOR CERVICAL FUSION/FORAMINOTOMY Left 10/20/2012   Procedure: C/5-6,C/6-7 Lami/Multi level,POSTERIOR CERVICAL FUSION/FORAMINOTOMY C/4-7;  Surgeon: Tia Alert, MD;  Location: [facility];  Service: Neurosurgery;  Laterality: Left;  Cervical Five-Six, Cervical Six-Seven Laminectomies, Posterior Cervical Fusion/Foraminotomies Cervical Four through Seven.    TONSILLECTOMY      Social History:  Social History   Socioeconomic History   Marital status: Married    Spouse name: Not on file   Number  of children: Not on file   Years of education: Not on file   Highest education level: Not on file  Occupational History   Not on file  Tobacco Use   Smoking status: Former    Current packs/day: 0.00    Average packs/day: 0.3 packs/day for 2.0 years (0.5 ttl pk-yrs)    Types: Cigarettes    Start date: 09/23/1967    Quit date: 09/22/1969    Years since quitting: 53.2   Smokeless tobacco: Never  Vaping Use   Vaping status: Never Used  Substance and Sexual Activity   Alcohol use: No   Drug use: No   Sexual activity:  Yes  Other Topics Concern   Not on file  Social History Narrative   Not on file   Social Determinants of Health   Financial Resource Strain: Not on file  Food Insecurity: No Food Insecurity (10/08/2022)   Hunger Vital Sign    Worried About Running Out of Food in the Last Year: Never true    Ran Out of Food in the Last Year: Never true  Transportation Needs: No Transportation Needs (10/08/2022)   PRAPARE - Administrator, Civil Service (Medical): No    Lack of Transportation (Non-Medical): No  Physical Activity: Not on file  Stress: Not on file  Social Connections: Not on file  Intimate Partner Violence: Not At Risk (10/08/2022)   Humiliation, Afraid, Rape, and Kick questionnaire    Fear of Current or Ex-Partner: No    Emotionally Abused: No    Physically Abused: No    Sexually Abused: No    Family History:  Family History  Problem Relation Age of Onset   Diabetes Mother    Stomach cancer Neg Hx    Rectal cancer Neg Hx    Pancreatic cancer Neg Hx    Colon cancer Neg Hx    Colon polyps Neg Hx    Esophageal cancer Neg Hx    Prostate cancer Neg Hx    Breast cancer Neg Hx     Medications:   Current Outpatient Medications on File Prior to Visit  Medication Sig Dispense Refill   acetaminophen (TYLENOL) 500 MG tablet Take 500 mg by mouth daily as needed for headache.     atorvastatin (LIPITOR) 40 MG tablet Take 1 tablet (40 mg total) by mouth daily. 30 tablet 0   B-D UF III MINI PEN NEEDLES 31G X 5 MM MISC Inject 1 Stick as directed as directed.     benazepril (LOTENSIN) 20 MG tablet Take 20 mg by mouth daily.     clopidogrel (PLAVIX) 75 MG tablet TAKE 1 TABLET(75 MG) BY MOUTH DAILY 30 tablet 3   denosumab (PROLIA) 60 MG/ML SOSY injection Inject 60 mg into the skin every 6 (six) months.     ezetimibe (ZETIA) 10 MG tablet Take 10 mg by mouth daily.     MOUNJARO 5 MG/0.5ML Pen Inject 5 mg into the skin once a week.     tamsulosin (FLOMAX) 0.4 MG CAPS capsule Take  0.4 mg by mouth daily. Patient states he is taking it twice daily     TOUJEO SOLOSTAR 300 UNIT/ML Solostar Pen Inject 30 Units into the skin daily.     VITAMIN D PO Take 1 tablet by mouth 2 (two) times daily. Twice a day pr patient     No current facility-administered medications on file prior to visit.    Allergies:   Allergies  Allergen Reactions   Jardiance [  Empagliflozin]     Hospital due to complications   Penicillins     CHILDHOOD ALLERGY Has patient had a PCN reaction causing immediate rash, facial/tongue/throat swelling, SOB or lightheadedness with hypotension: Unknown Has patient had a PCN reaction causing severe rash involving mucus membranes or skin necrosis: Unknown Has patient had a PCN reaction that required hospitalization: Unknown Has patient had a PCN reaction occurring within the last 10 years: Unknown If all of the above answers are "NO", then may proceed with Cephalosporin use.        OBJECTIVE:  Physical Exam  There were no vitals filed for this visit. There is no height or weight on file to calculate BMI. No results found.     11/07/2022   10:59 AM  Depression screen PHQ 2/9  Decreased Interest 0  Down, Depressed, Hopeless 0  PHQ - 2 Score 0  Altered sleeping 0  Tired, decreased energy 0  Change in appetite 0  Feeling bad or failure about yourself  0  Trouble concentrating 0  Moving slowly or fidgety/restless 0  Suicidal thoughts 0  PHQ-9 Score 0     General: well developed, well nourished, seated, in no evident distress Head: head normocephalic and atraumatic.   Neck: supple with no carotid or supraclavicular bruits Cardiovascular: regular rate and rhythm, no murmurs Musculoskeletal: no deformity Skin:  no rash/petichiae Vascular:  Normal pulses all extremities   Neurologic Exam Mental Status: Awake and fully alert.  Fluent speech and language.  Oriented to place and time. Recent and remote memory intact. Attention span, concentration  and fund of knowledge appropriate. Mood and affect appropriate.  Cranial Nerves: Fundoscopic exam reveals sharp disc margins. Pupils equal, briskly reactive to light. Extraocular movements full without nystagmus. Visual fields full to confrontation. Hearing intact. Facial sensation intact. Face, tongue, palate moves normally and symmetrically.  Motor: Normal bulk and tone. Normal strength in all tested extremity muscles Sensory.: intact to touch , pinprick , position and vibratory sensation.  Coordination: Rapid alternating movements normal in all extremities. Finger-to-nose and heel-to-shin performed accurately bilaterally. Gait and Station: Arises from chair without difficulty. Stance is normal. Gait demonstrates normal stride length and balance with ***. Tandem walk and heel toe ***.  Reflexes: 1+ and symmetric.    NIHSS  *** Modified Rankin  ***    ASSESSMENT: Kenneth [name] is a 72 y.o. year old male ***. Vascular risk factors include ***.      PLAN:  *** : Residual deficit: ***. Continue {anticoagulants:31417}  and ***  for secondary stroke prevention.  Discussed secondary stroke prevention measures and importance of close PCP follow up for aggressive stroke risk factor management. I have gone over the pathophysiology of stroke, warning signs and symptoms, risk factors and their management in some detail with instructions to go to the closest emergency room for symptoms of concern. HTN: BP goal <130/90.  Stable on *** per PCP HLD: LDL goal <70. Recent LDL ***.  DMII: A1c goal<7.0. Recent A1c ***.    Follow up in *** or call earlier if needed   CC:  [facility] provider: Dr. Pearlean Brownie PCP: [name], MD    I spent *** minutes of face-to-face and non-face-to-face time with patient.  This included previsit chart review including review of recent hospitalization, lab review, study review, order entry, electronic health record documentation, patient education regarding recent stroke  including etiology, secondary stroke prevention measures and importance of managing stroke risk factors, residual deficits and typical recovery time and  answered all other questions to patient satisfaction    Nicholas Lose, [facility]  [facility] [address] [place], [place] [address]  Phone [phone] Fax [phone] Note: This document was prepared with digital dictation and possible smart phrase technology. Any transcriptional errors that result from this process are unintentional.

## 2022-12-17 ENCOUNTER — Encounter: Payer: Self-pay | Admitting: Family Medicine

## 2022-12-17 ENCOUNTER — Ambulatory Visit: Payer: Medicare Other

## 2022-12-17 ENCOUNTER — Ambulatory Visit (INDEPENDENT_AMBULATORY_CARE_PROVIDER_SITE_OTHER): Payer: Medicare Other | Admitting: Family Medicine

## 2022-12-17 VITALS — BP 108/53 | HR 58 | Ht 71.0 in | Wt 211.0 lb

## 2022-12-17 DIAGNOSIS — G4733 Obstructive sleep apnea (adult) (pediatric): Secondary | ICD-10-CM | POA: Diagnosis not present

## 2022-12-17 DIAGNOSIS — Z794 Long term (current) use of insulin: Secondary | ICD-10-CM | POA: Diagnosis not present

## 2022-12-17 DIAGNOSIS — H9190 Unspecified hearing loss, unspecified ear: Secondary | ICD-10-CM

## 2022-12-17 DIAGNOSIS — E1169 Type 2 diabetes mellitus with other specified complication: Secondary | ICD-10-CM

## 2022-12-17 DIAGNOSIS — I639 Cerebral infarction, unspecified: Secondary | ICD-10-CM | POA: Diagnosis not present

## 2022-12-17 DIAGNOSIS — I10 Essential (primary) hypertension: Secondary | ICD-10-CM | POA: Diagnosis not present

## 2022-12-17 NOTE — Patient Instructions (Addendum)
Below is our plan:  Stroke: bilateral ACA infarct, left more than right, infarct embolic concerning for cardioembolic source: Residual deficit: none. Continue clopidogrel 75 mg daily  and atorvastatin 40mg  daily for secondary stroke prevention.  Discussed secondary stroke prevention measures and importance of close PCP follow up for aggressive stroke risk factor management. I have gone over the pathophysiology of stroke, warning signs and symptoms, risk factors and their management in some detail with instructions to go to the closest emergency room for symptoms of concern. HTN: BP goal <130/90.  Stable. Continue Lotensin 20mg  daily and chlorthalidone 25mg  daily per Dr Carlis Abbott and PCP.  HLD: LDL goal <70. Recent LDL 45. Continue atorvastatin 40mg  and Zetia daily per PCP.  DMII: A1c goal<7.0. Recent A1c 5.6. Well controlled. Continue Toujeo and Mounjaro.  Monitor closely. Well balanced diet and regular exercise advised.  OSA: consider treatment with oral appliance or CPAP. Consider repeat testing if you wish.   Goals:  1) Maintain strict control of hypertension with blood pressure goal below 130/90 2) Maintain good control of diabetes with hemoglobin A1c goal below 7%  3) Maintain good control of lipids with LDL cholesterol goal below 70 mg/dL.  4) Eat a healthy diet with plenty of whole grains, cereals, fruits and vegetables, exercise regularly and maintain ideal body weight   Resources: https://www.williams.biz/  Please make sure you are staying well hydrated. I recommend 50-60 ounces daily. Well balanced diet and regular exercise encouraged. Consistent sleep schedule with 6-8 hours recommended.   Please continue follow up with care team as directed.   Follow up with me as needed   You may receive a survey regarding today's visit. I encourage you to leave honest feed back as I do use this information to improve patient care.  Thank you for seeing me today!

## 2023-01-02 NOTE — Progress Notes (Signed)
[facility] Summary Report / Loop Recorder 

## 2023-01-19 ENCOUNTER — Ambulatory Visit: Payer: Medicare Other

## 2023-01-19 DIAGNOSIS — I639 Cerebral infarction, unspecified: Secondary | ICD-10-CM | POA: Diagnosis not present

## 2023-01-20 DIAGNOSIS — I10 Essential (primary) hypertension: Secondary | ICD-10-CM | POA: Diagnosis not present

## 2023-01-20 DIAGNOSIS — E1165 Type 2 diabetes mellitus with hyperglycemia: Secondary | ICD-10-CM | POA: Diagnosis not present

## 2023-01-20 DIAGNOSIS — E785 Hyperlipidemia, unspecified: Secondary | ICD-10-CM | POA: Diagnosis not present

## 2023-01-20 DIAGNOSIS — Z794 Long term (current) use of insulin: Secondary | ICD-10-CM | POA: Diagnosis not present

## 2023-01-20 LAB — CUP PACEART REMOTE DEVICE CHECK
Date Time Interrogation Session: 20240910135114
Implantable Pulse Generator Implant Date: 20240531

## 2023-02-05 NOTE — Progress Notes (Signed)
Carelink Summary Report / Loop Recorder

## 2023-02-23 ENCOUNTER — Ambulatory Visit (INDEPENDENT_AMBULATORY_CARE_PROVIDER_SITE_OTHER): Payer: Medicare Other

## 2023-02-23 DIAGNOSIS — I639 Cerebral infarction, unspecified: Secondary | ICD-10-CM | POA: Diagnosis not present

## 2023-02-23 LAB — CUP PACEART REMOTE DEVICE CHECK
Date Time Interrogation Session: 20241013135106
Implantable Pulse Generator Implant Date: 20240531

## 2023-03-05 ENCOUNTER — Ambulatory Visit: Payer: Medicare Other | Attending: Cardiology | Admitting: Cardiology

## 2023-03-05 ENCOUNTER — Encounter: Payer: Self-pay | Admitting: Cardiology

## 2023-03-05 VITALS — BP 118/64 | HR 54 | Ht 71.0 in | Wt 217.0 lb

## 2023-03-05 DIAGNOSIS — E785 Hyperlipidemia, unspecified: Secondary | ICD-10-CM | POA: Insufficient documentation

## 2023-03-05 DIAGNOSIS — I639 Cerebral infarction, unspecified: Secondary | ICD-10-CM | POA: Diagnosis not present

## 2023-03-05 NOTE — Progress Notes (Signed)
Cardiology Office Note:  .   Date:  03/05/2023  ID:  Kenneth [name], DOB 01-26-1951, MRN [id number] PCP: Kenneth Ran, MD  [facility] Providers Cardiologist:  Kenneth Schultz, MD     History of Present Illness: Marland Kitchen   Kenneth [name] is a 71 y.o. 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 He reports feeling overwhelmed and not quite right, attributing this to the stress of his wife's prolonged hospitalization and subsequent move to a nursing home. He describes feeling dizzy, a symptom that improved after discontinuing tamsulosin, a medication he was taking for urinary symptoms.  The patient's stroke was characterized by severe dizziness and a fall, leading to hospitalization and rehabilitation. He reports a good recovery from the stroke, but expresses concern about the potential for another event. He is currently on dual antiplatelet therapy with Plavix, following a recommendation after his stroke, which was suspected to be cardioembolic in nature.  The patient's wife's illness and the demands of caregiving have significantly impacted his emotional well-being. He describes feeling depressed and overwhelmed, but has been hesitant to take anti-anxiety or antidepressant medications. He expresses a desire to take care of himself and avoid another stroke, but is unsure how to manage his current emotional state.  Despite these challenges, the patient has been proactive in managing his health. He monitors his blood pressure regularly, maintains an exercise routine of walking 40 minutes daily, and adheres to his prescribed medications, including Plavix, chlorthalidone, Lipitor, and Zetia. His LDL was 45 in May 2024, and his A1C was 7.1, up from 5.9. He expresses concern that his A1C may have increased further since then.  The patient's  recent brain MRI revealed acute bilateral ACA territory infarcts, left greater than right. An echocardiogram showed an EF of 60% with mildly elevated pulmonary pressures of 43 mmHg and a negative bubble study. A loop recorder was implanted prior to discharge for further monitoring.          ROS: No CP  Studies Reviewed: Marland Kitchen   EKG Interpretation Date/Time:  Thursday March 05 2023 13:09:45 EDT Ventricular Rate:  54 PR Interval:  178 QRS Duration:  98 QT Interval:  410 QTC Calculation: 388 R Axis:   17  Text Interpretation: Sinus bradycardia Low voltage QRS When compared with ECG of 24-Jul-2017 10:11, No significant change was found Confirmed by Kenneth [name] ([id number]) on 03/05/2023 1:29:37 PM    Results LABS LDL: 52 HbA1c: 7.1  RADIOLOGY Coronary calcium score: 135, 48th percentile MRI of the brain: Acute bilateral ACA territory infarcts, left greater than right  DIAGNOSTIC Echocardiogram: Pulmonary pressures 43 mmHg, mildly elevated Echocardiogram: EF 60%, negative bubble study Doppler: Negative for DVT Mild carotid plaque Bilat Risk Assessment/Calculations:            Physical Exam:   VS:  BP 118/64   Pulse (!) 54   Ht 5\' 11"  (1.803 m)   Wt 217 lb (98.4 kg)   SpO2 97%   BMI 30.27 kg/m    Wt Readings from Last 3 Encounters:  03/05/23 217 lb (98.4 kg)  12/17/22 211 lb (95.7 kg)  11/07/22 218 lb (98.9 kg)    GEN: Well nourished, well developed in no acute distress NECK: No JVD; No carotid bruits CARDIAC: RRR, no murmurs, no rubs, no gallops RESPIRATORY:  Clear to auscultation  without rales, wheezing or rhonchi  ABDOMEN: Soft, non-tender, non-distended EXTREMITIES:  No edema; No deformity   ASSESSMENT AND PLAN: .    Assessment and Plan    Recent Stroke Bilateral ACA territory infarcts, left greater than right, likely cardioembolic in origin. Currently on Plavix. Loop recorder placed by Dr. Ladona Ridgel. -Continue Plavix as prescribed. -Ensure follow-up with Dr.  Ladona Ridgel for loop recorder monitoring. App on phone.   Hyperlipidemia LDL 52 on statin and Zetia. -Continue Lipitor 40mg  and Zetia 10mg  daily.  Diabetes A1C 7.1, up from 5.9. -Continue current management and monitor A1C.  Depression/Overwhelm Patient reports feeling overwhelmed due to personal circumstances, including wife's health issues and stroke recovery. -Discuss feelings of overwhelm and potential need for short-term antidepressant or anti-anxiety medication with Dr. Waynard Edwards at upcoming appointment.  Dizziness Patient reports improvement in dizziness since discontinuing Tamsulosin. -Discontinue Tamsulosin.  General Health Maintenance -Continue daily exercise regimen. -Schedule annual follow-up appointment.              Signed, Kenneth Schultz, MD

## 2023-03-05 NOTE — Patient Instructions (Signed)
Medication Instructions:  The current medical regimen is effective;  continue present plan and medications.  *If you need a refill on your cardiac medications before your next appointment, please call your pharmacy*  Follow-Up: At [facility], you and your health needs are our priority.  As part of our continuing mission to provide you with exceptional heart care, we have created designated Provider Care Teams.  These Care Teams include your primary Cardiologist (physician) and Advanced Practice Providers (APPs -  Physician Assistants and Nurse Practitioners) who all work together to provide you with the care you need, when you need it.  We recommend signing up for the patient portal called "MyChart".  Sign up information is provided on this After Visit Summary.  MyChart is used to connect with patients for Virtual Visits (Telemedicine).  Patients are able to view lab/test results, encounter notes, upcoming appointments, etc.  Non-urgent messages can be sent to your provider as well.   To learn more about what you can do with MyChart, go to [email or web address].[email or web address].    Your next appointment:   1 year(s)  Provider:   Franciscojavier Skains, MD      

## 2023-03-06 DIAGNOSIS — Z23 Encounter for immunization: Secondary | ICD-10-CM | POA: Diagnosis not present

## 2023-03-11 DIAGNOSIS — C61 Malignant neoplasm of prostate: Secondary | ICD-10-CM | POA: Diagnosis not present

## 2023-03-11 NOTE — Progress Notes (Signed)
[facility] Summary Report / Loop Recorder 

## 2023-03-16 DIAGNOSIS — I272 Pulmonary hypertension, unspecified: Secondary | ICD-10-CM | POA: Diagnosis not present

## 2023-03-16 DIAGNOSIS — R809 Proteinuria, unspecified: Secondary | ICD-10-CM | POA: Diagnosis not present

## 2023-03-16 DIAGNOSIS — D649 Anemia, unspecified: Secondary | ICD-10-CM | POA: Diagnosis not present

## 2023-03-16 DIAGNOSIS — Z0000 Encounter for general adult medical examination without abnormal findings: Secondary | ICD-10-CM | POA: Diagnosis not present

## 2023-03-16 DIAGNOSIS — E1129 Type 2 diabetes mellitus with other diabetic kidney complication: Secondary | ICD-10-CM | POA: Diagnosis not present

## 2023-03-16 DIAGNOSIS — Z125 Encounter for screening for malignant neoplasm of prostate: Secondary | ICD-10-CM | POA: Diagnosis not present

## 2023-03-16 DIAGNOSIS — I10 Essential (primary) hypertension: Secondary | ICD-10-CM | POA: Diagnosis not present

## 2023-03-16 DIAGNOSIS — E291 Testicular hypofunction: Secondary | ICD-10-CM | POA: Diagnosis not present

## 2023-03-16 DIAGNOSIS — E785 Hyperlipidemia, unspecified: Secondary | ICD-10-CM | POA: Diagnosis not present

## 2023-03-18 DIAGNOSIS — Z8546 Personal history of malignant neoplasm of prostate: Secondary | ICD-10-CM | POA: Diagnosis not present

## 2023-03-18 DIAGNOSIS — R350 Frequency of micturition: Secondary | ICD-10-CM | POA: Diagnosis not present

## 2023-03-18 DIAGNOSIS — R351 Nocturia: Secondary | ICD-10-CM | POA: Diagnosis not present

## 2023-03-18 DIAGNOSIS — R3915 Urgency of urination: Secondary | ICD-10-CM | POA: Diagnosis not present

## 2023-03-18 DIAGNOSIS — N401 Enlarged prostate with lower urinary tract symptoms: Secondary | ICD-10-CM | POA: Diagnosis not present

## 2023-03-23 DIAGNOSIS — I272 Pulmonary hypertension, unspecified: Secondary | ICD-10-CM | POA: Diagnosis not present

## 2023-03-23 DIAGNOSIS — E669 Obesity, unspecified: Secondary | ICD-10-CM | POA: Diagnosis not present

## 2023-03-23 DIAGNOSIS — Z0000 Encounter for general adult medical examination without abnormal findings: Secondary | ICD-10-CM | POA: Diagnosis not present

## 2023-03-23 DIAGNOSIS — Z8673 Personal history of transient ischemic attack (TIA), and cerebral infarction without residual deficits: Secondary | ICD-10-CM | POA: Diagnosis not present

## 2023-03-23 DIAGNOSIS — E1165 Type 2 diabetes mellitus with hyperglycemia: Secondary | ICD-10-CM | POA: Diagnosis not present

## 2023-03-23 DIAGNOSIS — I10 Essential (primary) hypertension: Secondary | ICD-10-CM | POA: Diagnosis not present

## 2023-03-23 DIAGNOSIS — C61 Malignant neoplasm of prostate: Secondary | ICD-10-CM | POA: Diagnosis not present

## 2023-03-23 DIAGNOSIS — F329 Major depressive disorder, single episode, unspecified: Secondary | ICD-10-CM | POA: Diagnosis not present

## 2023-03-23 DIAGNOSIS — G4733 Obstructive sleep apnea (adult) (pediatric): Secondary | ICD-10-CM | POA: Diagnosis not present

## 2023-03-23 DIAGNOSIS — E785 Hyperlipidemia, unspecified: Secondary | ICD-10-CM | POA: Diagnosis not present

## 2023-03-23 DIAGNOSIS — R809 Proteinuria, unspecified: Secondary | ICD-10-CM | POA: Diagnosis not present

## 2023-03-23 DIAGNOSIS — I700 Atherosclerosis of aorta: Secondary | ICD-10-CM | POA: Diagnosis not present

## 2023-03-30 ENCOUNTER — Ambulatory Visit (INDEPENDENT_AMBULATORY_CARE_PROVIDER_SITE_OTHER): Payer: Medicare Other

## 2023-03-30 DIAGNOSIS — I639 Cerebral infarction, unspecified: Secondary | ICD-10-CM

## 2023-03-30 LAB — CUP PACEART REMOTE DEVICE CHECK: Date Time Interrogation Session: 20241117233140

## 2023-04-06 ENCOUNTER — Emergency Department (HOSPITAL_COMMUNITY): Payer: Medicare Other

## 2023-04-06 ENCOUNTER — Other Ambulatory Visit: Payer: Self-pay

## 2023-04-06 ENCOUNTER — Inpatient Hospital Stay (HOSPITAL_COMMUNITY)
Admission: EM | Admit: 2023-04-06 | Discharge: 2023-04-08 | DRG: 066 | Disposition: A | Discharge status: Home / Self Care | Payer: Medicare Other | Attending: Internal Medicine | Admitting: Internal Medicine

## 2023-04-06 ENCOUNTER — Encounter (HOSPITAL_COMMUNITY): Payer: Self-pay

## 2023-04-06 DIAGNOSIS — G4733 Obstructive sleep apnea (adult) (pediatric): Secondary | ICD-10-CM | POA: Diagnosis present

## 2023-04-06 DIAGNOSIS — I63511 Cerebral infarction due to unspecified occlusion or stenosis of right middle cerebral artery: Secondary | ICD-10-CM | POA: Diagnosis not present

## 2023-04-06 DIAGNOSIS — I63522 Cerebral infarction due to unspecified occlusion or stenosis of left anterior cerebral artery: Secondary | ICD-10-CM | POA: Diagnosis not present

## 2023-04-06 DIAGNOSIS — Z7902 Long term (current) use of antithrombotics/antiplatelets: Secondary | ICD-10-CM | POA: Diagnosis not present

## 2023-04-06 DIAGNOSIS — I639 Cerebral infarction, unspecified: Principal | ICD-10-CM | POA: Diagnosis present

## 2023-04-06 DIAGNOSIS — Z794 Long term (current) use of insulin: Secondary | ICD-10-CM | POA: Diagnosis not present

## 2023-04-06 DIAGNOSIS — E785 Hyperlipidemia, unspecified: Secondary | ICD-10-CM | POA: Diagnosis not present

## 2023-04-06 DIAGNOSIS — Z79899 Other long term (current) drug therapy: Secondary | ICD-10-CM

## 2023-04-06 DIAGNOSIS — Z860101 Personal history of adenomatous and serrated colon polyps: Secondary | ICD-10-CM

## 2023-04-06 DIAGNOSIS — I6319 Cerebral infarction due to embolism of other precerebral artery: Secondary | ICD-10-CM

## 2023-04-06 DIAGNOSIS — Z4509 Encounter for adjustment and management of other cardiac device: Secondary | ICD-10-CM

## 2023-04-06 DIAGNOSIS — I119 Hypertensive heart disease without heart failure: Secondary | ICD-10-CM | POA: Diagnosis present

## 2023-04-06 DIAGNOSIS — I63322 Cerebral infarction due to thrombosis of left anterior cerebral artery: Secondary | ICD-10-CM | POA: Diagnosis not present

## 2023-04-06 DIAGNOSIS — E1165 Type 2 diabetes mellitus with hyperglycemia: Secondary | ICD-10-CM | POA: Diagnosis present

## 2023-04-06 DIAGNOSIS — Z7982 Long term (current) use of aspirin: Secondary | ICD-10-CM

## 2023-04-06 DIAGNOSIS — Z7984 Long term (current) use of oral hypoglycemic drugs: Secondary | ICD-10-CM | POA: Diagnosis not present

## 2023-04-06 DIAGNOSIS — H9193 Unspecified hearing loss, bilateral: Secondary | ICD-10-CM | POA: Diagnosis present

## 2023-04-06 DIAGNOSIS — Z6830 Body mass index (BMI) 30.0-30.9, adult: Secondary | ICD-10-CM

## 2023-04-06 DIAGNOSIS — M4802 Spinal stenosis, cervical region: Secondary | ICD-10-CM | POA: Diagnosis present

## 2023-04-06 DIAGNOSIS — Z9181 History of falling: Secondary | ICD-10-CM

## 2023-04-06 DIAGNOSIS — Z7985 Long-term (current) use of injectable non-insulin antidiabetic drugs: Secondary | ICD-10-CM | POA: Diagnosis not present

## 2023-04-06 DIAGNOSIS — I6782 Cerebral ischemia: Secondary | ICD-10-CM | POA: Diagnosis not present

## 2023-04-06 DIAGNOSIS — E669 Obesity, unspecified: Secondary | ICD-10-CM | POA: Diagnosis not present

## 2023-04-06 DIAGNOSIS — Z87891 Personal history of nicotine dependence: Secondary | ICD-10-CM

## 2023-04-06 DIAGNOSIS — Z91199 Patient's noncompliance with other medical treatment and regimen due to unspecified reason: Secondary | ICD-10-CM

## 2023-04-06 DIAGNOSIS — E559 Vitamin D deficiency, unspecified: Secondary | ICD-10-CM | POA: Diagnosis present

## 2023-04-06 DIAGNOSIS — I63512 Cerebral infarction due to unspecified occlusion or stenosis of left middle cerebral artery: Secondary | ICD-10-CM | POA: Diagnosis not present

## 2023-04-06 DIAGNOSIS — R270 Ataxia, unspecified: Secondary | ICD-10-CM | POA: Diagnosis present

## 2023-04-06 DIAGNOSIS — I10 Essential (primary) hypertension: Secondary | ICD-10-CM | POA: Diagnosis present

## 2023-04-06 DIAGNOSIS — Z833 Family history of diabetes mellitus: Secondary | ICD-10-CM | POA: Diagnosis not present

## 2023-04-06 DIAGNOSIS — Z981 Arthrodesis status: Secondary | ICD-10-CM

## 2023-04-06 DIAGNOSIS — R29701 NIHSS score 1: Secondary | ICD-10-CM | POA: Diagnosis present

## 2023-04-06 DIAGNOSIS — E1169 Type 2 diabetes mellitus with other specified complication: Secondary | ICD-10-CM | POA: Diagnosis not present

## 2023-04-06 DIAGNOSIS — I6501 Occlusion and stenosis of right vertebral artery: Secondary | ICD-10-CM | POA: Diagnosis not present

## 2023-04-06 DIAGNOSIS — E119 Type 2 diabetes mellitus without complications: Secondary | ICD-10-CM

## 2023-04-06 DIAGNOSIS — Z8673 Personal history of transient ischemic attack (TIA), and cerebral infarction without residual deficits: Secondary | ICD-10-CM | POA: Diagnosis not present

## 2023-04-06 DIAGNOSIS — I6389 Other cerebral infarction: Principal | ICD-10-CM | POA: Diagnosis present

## 2023-04-06 DIAGNOSIS — Z888 Allergy status to other drugs, medicaments and biological substances status: Secondary | ICD-10-CM

## 2023-04-06 DIAGNOSIS — Z8546 Personal history of malignant neoplasm of prostate: Secondary | ICD-10-CM

## 2023-04-06 DIAGNOSIS — R29810 Facial weakness: Secondary | ICD-10-CM | POA: Diagnosis present

## 2023-04-06 HISTORY — DX: Cerebral infarction, unspecified: I63.9

## 2023-04-06 LAB — CBC WITH DIFFERENTIAL/PLATELET
Abs Immature Granulocytes: 0.04 10*3/uL (ref 0.00–0.07)
Basophils Absolute: 0 10*3/uL (ref 0.0–0.1)
Basophils Relative: 0 %
Eosinophils Absolute: 0.1 10*3/uL (ref 0.0–0.5)
Eosinophils Relative: 1 %
HCT: 37 % — ABNORMAL LOW (ref 39.0–52.0)
Hemoglobin: 12.5 g/dL — ABNORMAL LOW (ref 13.0–17.0)
Immature Granulocytes: 0 %
Lymphocytes Relative: 17 %
Lymphs Abs: 1.6 10*3/uL (ref 0.7–4.0)
MCH: 30.2 pg (ref 26.0–34.0)
MCHC: 33.8 g/dL (ref 30.0–36.0)
MCV: 89.4 fL (ref 80.0–100.0)
Monocytes Absolute: 1 10*3/uL (ref 0.1–1.0)
Monocytes Relative: 11 %
Neutro Abs: 6.7 10*3/uL (ref 1.7–7.7)
Neutrophils Relative %: 71 %
Platelets: 176 10*3/uL (ref 150–400)
RBC: 4.14 MIL/uL — ABNORMAL LOW (ref 4.22–5.81)
RDW: 12.4 % (ref 11.5–15.5)
WBC: 9.5 10*3/uL (ref 4.0–10.5)
nRBC: 0 % (ref 0.0–0.2)

## 2023-04-06 LAB — URINALYSIS, ROUTINE W REFLEX MICROSCOPIC
Bilirubin Urine: NEGATIVE
Glucose, UA: NEGATIVE mg/dL
Hgb urine dipstick: NEGATIVE
Ketones, ur: NEGATIVE mg/dL
Leukocytes,Ua: NEGATIVE
Nitrite: NEGATIVE
Protein, ur: NEGATIVE mg/dL
Specific Gravity, Urine: 1.02 (ref 1.005–1.030)
pH: 7 (ref 5.0–8.0)

## 2023-04-06 LAB — I-STAT CHEM 8, ED
BUN: 15 mg/dL (ref 8–23)
Calcium, Ion: 1.17 mmol/L (ref 1.15–1.40)
Chloride: 98 mmol/L (ref 98–111)
Creatinine, Ser: 1 mg/dL (ref 0.61–1.24)
Glucose, Bld: 98 mg/dL (ref 70–99)
HCT: 35 % — ABNORMAL LOW (ref 39.0–52.0)
Hemoglobin: 11.9 g/dL — ABNORMAL LOW (ref 13.0–17.0)
Potassium: 3.4 mmol/L — ABNORMAL LOW (ref 3.5–5.1)
Sodium: 140 mmol/L (ref 135–145)
TCO2: 28 mmol/L (ref 22–32)

## 2023-04-06 LAB — BASIC METABOLIC PANEL
Anion gap: 9 (ref 5–15)
BUN: 18 mg/dL (ref 8–23)
CO2: 25 mmol/L (ref 22–32)
Calcium: 9.7 mg/dL (ref 8.9–10.3)
Chloride: 101 mmol/L (ref 98–111)
Creatinine, Ser: 0.89 mg/dL (ref 0.61–1.24)
GFR, Estimated: >60 mL/min (ref >60)
Glucose, Bld: 174 mg/dL — ABNORMAL HIGH (ref 70–99)
Potassium: 3.4 mmol/L — ABNORMAL LOW (ref 3.5–5.1)
Sodium: 135 mmol/L (ref 135–145)

## 2023-04-06 LAB — GLUCOSE, CAPILLARY: Glucose-Capillary: 149 mg/dL — ABNORMAL HIGH (ref 70–99)

## 2023-04-06 LAB — APTT: aPTT: 30 s (ref 24–36)

## 2023-04-06 LAB — RAPID URINE DRUG SCREEN, HOSP PERFORMED
Amphetamines: NOT DETECTED
Barbiturates: NOT DETECTED
Benzodiazepines: NOT DETECTED
Cocaine: NOT DETECTED
Opiates: NOT DETECTED
Tetrahydrocannabinol: NOT DETECTED

## 2023-04-06 LAB — ETHANOL: Alcohol, Ethyl (B): <10 mg/dL (ref <10)

## 2023-04-06 LAB — PROTIME-INR
INR: 1.1 (ref 0.8–1.2)
Prothrombin Time: 14.8 s (ref 11.4–15.2)

## 2023-04-06 MED ORDER — INSULIN GLARGINE (1 UNIT DIAL) 300 UNIT/ML ~~LOC~~ SOPN: 30.0000 [IU] | PEN_INJECTOR | Freq: Every day | SUBCUTANEOUS | Status: DC

## 2023-04-06 MED ORDER — CLOPIDOGREL BISULFATE 75 MG PO TABS
75.0000 mg | ORAL_TABLET | Freq: Every morning | ORAL | Status: DC
  Administered 2023-04-07: 75 mg via ORAL
  Filled 2023-04-06: qty 1

## 2023-04-06 MED ORDER — SENNOSIDES-DOCUSATE SODIUM 8.6-50 MG PO TABS: 1.0000 | ORAL_TABLET | Freq: Every evening | ORAL | Status: DC | PRN

## 2023-04-06 MED ORDER — POTASSIUM CHLORIDE 20 MEQ PO PACK
40.0000 meq | PACK | Freq: Once | ORAL | Status: AC
  Administered 2023-04-06: 40 meq via ORAL
  Filled 2023-04-06: qty 2

## 2023-04-06 MED ORDER — INSULIN ASPART 100 UNIT/ML IJ SOLN
0.0000 [IU] | Freq: Every day | INTRAMUSCULAR | Status: DC
  Filled 2023-04-06: qty 0.05

## 2023-04-06 MED ORDER — INSULIN ASPART 100 UNIT/ML IJ SOLN
0.0000 [IU] | Freq: Three times a day (TID) | INTRAMUSCULAR | Status: DC
  Administered 2023-04-07: 2 [IU] via SUBCUTANEOUS
  Filled 2023-04-06: qty 0.15

## 2023-04-06 MED ORDER — ASPIRIN 325 MG PO TABS
325.0000 mg | ORAL_TABLET | Freq: Once | ORAL | Status: AC
  Administered 2023-04-06: 325 mg via ORAL
  Filled 2023-04-06: qty 1

## 2023-04-06 MED ORDER — EZETIMIBE 10 MG PO TABS
10.0000 mg | ORAL_TABLET | Freq: Every day | ORAL | Status: DC
  Administered 2023-04-07 – 2023-04-08 (×2): 10 mg via ORAL
  Filled 2023-04-06 (×2): qty 1

## 2023-04-06 MED ORDER — TIRZEPATIDE 5 MG/0.5ML ~~LOC~~ SOAJ: 5.0000 mg | SUBCUTANEOUS | Status: DC

## 2023-04-06 MED ORDER — VITAMIN D 25 MCG (1000 UNIT) PO TABS
1000.0000 [IU] | ORAL_TABLET | Freq: Every day | ORAL | Status: DC
  Administered 2023-04-07 – 2023-04-08 (×2): 1000 [IU] via ORAL
  Filled 2023-04-06 (×2): qty 1

## 2023-04-06 MED ORDER — ASPIRIN 300 MG RE SUPP: 300.0000 mg | Freq: Once | RECTAL | Status: DC

## 2023-04-06 MED ORDER — ACETAMINOPHEN 650 MG RE SUPP: 650.0000 mg | RECTAL | Status: DC | PRN

## 2023-04-06 MED ORDER — ATORVASTATIN CALCIUM 40 MG PO TABS
40.0000 mg | ORAL_TABLET | Freq: Every day | ORAL | Status: DC
  Administered 2023-04-06 – 2023-04-07 (×2): 40 mg via ORAL
  Filled 2023-04-06 (×2): qty 1

## 2023-04-06 MED ORDER — IOHEXOL 350 MG/ML SOLN
75.0000 mL | Freq: Once | INTRAVENOUS | Status: AC | PRN
  Administered 2023-04-06: 75 mL via INTRAVENOUS

## 2023-04-06 MED ORDER — ACETAMINOPHEN 160 MG/5ML PO SOLN: 650.0000 mg | ORAL | Status: DC | PRN

## 2023-04-06 MED ORDER — STROKE: EARLY STAGES OF RECOVERY BOOK
Freq: Once | Status: AC
  Filled 2023-04-06: qty 1

## 2023-04-06 MED ORDER — ENOXAPARIN SODIUM 40 MG/0.4ML IJ SOSY
40.0000 mg | PREFILLED_SYRINGE | INTRAMUSCULAR | Status: DC
  Administered 2023-04-06 – 2023-04-07 (×2): 40 mg via SUBCUTANEOUS
  Filled 2023-04-06 (×2): qty 0.4

## 2023-04-06 MED ORDER — ACETAMINOPHEN 325 MG PO TABS
650.0000 mg | ORAL_TABLET | ORAL | Status: DC | PRN
  Administered 2023-04-08: 650 mg via ORAL
  Filled 2023-04-06: qty 2

## 2023-04-06 MED ORDER — INSULIN GLARGINE-YFGN 100 UNIT/ML ~~LOC~~ SOLN
30.0000 [IU] | Freq: Every day | SUBCUTANEOUS | Status: DC
  Administered 2023-04-07 – 2023-04-08 (×2): 30 [IU] via SUBCUTANEOUS
  Filled 2023-04-06 (×2): qty 0.3

## 2023-04-06 NOTE — ED Triage Notes (Signed)
Pt c/o unsteady gait x3 days and woke up w/ dizziness this morning.  Pt reports starting Prozac yesterday.  Pt went to bed around 2300 last night.     Pt had a stroke in mat 2024.    Pt reports he has been under a lot of stress.

## 2023-04-06 NOTE — H&P (Signed)
History and Physical    Patient: Kenneth [name] ZDG:[id number] DOB: [birth year] DOA: 04/06/2023 DOS: the patient was seen and examined on 04/06/2023 PCP: [name], MD  Patient coming from: Home  Chief Complaint:  Chief Complaint  Patient presents with   Gait Problem   Dizziness   HPI: Kenneth [name] is a 72 y.o. male with medical history significant of remote stroke.  Patient denies smoking.  Patient be reports being in his usual state of health when he went to bed last evening.  Patient woke up this morning and reports sensation of "dizziness ".  Which she further describes as being a fall risk and having to hold onto the furniture to ambulate.  Patient does not report a spinning sensation or any presyncope.  Patient did not pass out.  Patient does not report any focal weakness.  Patient does not report any hearing disability, please note patient is chronically very hard of hearing, patient does not report any vision changes or trouble with swallowing or speech.  Patient was concerned that he was having another stroke and came to the ER.  Workup in the ER is notable for a positive finding of MRI for acute stroke.  Medical evaluation is sought after code stroke has been called and case has been discussed with Dr. Jerrell Belfast of neurology.  At this time patient is in bed recumbent and reports no complaints Review of Systems: As mentioned in the history of present illness. All other systems reviewed and are negative. 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   History of colon polyps - adenomas 04/03/2010   Hyperlipidemia    Hypertension    Iron deficiency anemia, unspecified    OSA (obstructive sleep apnea)    refusing CPAP   Pneumonia 03/2022   w/effusion   Prostate cancer (HCC)    Stroke [facility])     Past Surgical History:  Procedure Laterality Date   ANTERIOR CERVICAL DECOMP/DISCECTOMY FUSION N/A 10/01/2012   Procedure: ANTERIOR CERVICAL DECOMPRESSION/DISCECTOMY FUSION 2 LEVELS;  Surgeon: Tia Alert, MD;  Location: [facility] NEURO ORS;  Service: Neurosurgery;  Laterality: N/A;  Cervical five-six,Cervical six-seven   COLONOSCOPY  09/30/2010, 03/01/2014   diverticulosis, small internal hemorrhoids   COLONOSCOPY W/ POLYPECTOMY  04/03/2010   4 polyps 8-85mm, worst TV adenoma with high-grade dysplasia   LOOP RECORDER INSERTION N/A 10/10/2022   Procedure: LOOP RECORDER INSERTION;  Surgeon: Marinus Maw, MD;  Location: [facility] INVASIVE CV LAB;  Service: Cardiovascular;  Laterality: N/A;   NO PAST SURGERIES     POLYPECTOMY     POSTERIOR CERVICAL FUSION/FORAMINOTOMY Left 10/20/2012   Procedure: C/5-6,C/6-7 Lami/Multi level,POSTERIOR CERVICAL FUSION/FORAMINOTOMY C/4-7;  Surgeon: Tia Alert, MD;  Location: [facility] NEURO ORS;  Service: Neurosurgery;  Laterality: Left;  Cervical Five-Six, Cervical Six-Seven Laminectomies, Posterior Cervical Fusion/Foraminotomies Cervical Four through Seven.    TONSILLECTOMY     Social History:  reports that he quit smoking about 53 years ago. His smoking use included cigarettes. He started smoking about 55 years ago. He has a 0.5 pack-year smoking history. He has never used smokeless tobacco. He reports that he does not drink alcohol and does not use drugs.  Allergies  Allergen Reactions   Jardiance [Empagliflozin] Other (See Comments)  To the hospital- due to complications   Fluoxetine Other (See Comments)    Prefers to NOT take this   Penicillins Other (See Comments)    CHILDHOOD ALLERGY- exact reaction? Has patient had a PCN reaction causing immediate rash, facial/tongue/throat swelling, SOB or lightheadedness with hypotension: Unknown Has patient had a PCN reaction causing severe rash involving mucus membranes or skin necrosis: Unknown Has patient had a PCN  reaction that required hospitalization: Unknown Has patient had a PCN reaction occurring within the last 10 years: Unknown If all of the above answers are "NO", then may proceed with Cephalosporin use.     Silodosin Other (See Comments)    Vertigo     Family History  Problem Relation Age of Onset   Diabetes Mother    Stomach cancer Neg Hx    Rectal cancer Neg Hx    Pancreatic cancer Neg Hx    Colon cancer Neg Hx    Colon polyps Neg Hx    Esophageal cancer Neg Hx    Prostate cancer Neg Hx    Breast cancer Neg Hx     Prior to Admission medications   Medication Sig Start Date End Date Taking? Authorizing Provider  aspirin EC 325 MG tablet Take 325 mg by mouth every 8 (eight) hours as needed (for pain).   Yes [provider]  atorvastatin (LIPITOR) 40 MG tablet Take 1 tablet (40 mg total) by mouth daily. Patient taking differently: Take 40 mg by mouth at bedtime. 10/17/22  Yes Love, Evlyn Kanner, PA-C  benazepril (LOTENSIN) 40 MG tablet Take 20 mg by mouth in the morning and at bedtime.   Yes [provider]  chlorthalidone (HYGROTON) 25 MG tablet Take 25 mg by mouth daily.   Yes [provider]  Cholecalciferol (VITAMIN D3) 1000 units CAPS Take 1,000 Units by mouth in the morning and at bedtime.   Yes [provider]  clopidogrel (PLAVIX) 75 MG tablet TAKE 1 TABLET(75 MG) BY MOUTH DAILY Patient taking differently: Take 75 mg by mouth in the morning. 11/24/22  Yes Raulkar, Drema Pry, MD  Continuous Glucose Sensor (FREESTYLE LIBRE 14 DAY SENSOR) MISC Inject 1 Device into the skin every 14 (fourteen) days.   Yes [provider]  ezetimibe (ZETIA) 10 MG tablet Take 10 mg by mouth daily. 01/19/20  Yes [provider]  metFORMIN (GLUCOPHAGE-XR) 500 MG 24 hr tablet Take 500 mg by mouth in the morning and at bedtime.   Yes [provider]  MOUNJARO 5 MG/0.5ML Pen Inject 5 mg into the skin every Monday.   Yes [provider]   TOUJEO SOLOSTAR 300 UNIT/ML Solostar Pen Inject 30 Units into the skin daily. Patient taking differently: Inject 36 Units into the skin in the morning. 10/17/22  Yes Love, Evlyn Kanner, PA-C  B-D UF III MINI PEN NEEDLES 31G X 5 MM MISC Inject 1 Stick as directed as directed. 11/08/15   [provider]    Physical Exam: Vitals:   04/06/23 1129 04/06/23 1415 04/06/23 1430 04/06/23 1616  BP: (!) 124/59 (!) 110/50 (!) 123/57   Pulse: (!) 56 (!) 49 (!) 51   Resp: 19 14 15    Temp: 97.6 F (36.4 C)   (!) 97.4 F (36.3 C)  TempSrc: Oral   Oral  SpO2: 98% 100% 100%   Weight:      Height:       General: Patient appears to be in no distress, appears fully oriented.  Patient is  hearing using hearing aid in hearing seems to be good. Respiratory exam: Bilateral intravesicular Cardiovascular exam S1 is normal Abdomen all quadrants are soft nontender Extremities warm without edema.  No focal weakness is noted.  I did not appreciate any discoordinated signs.  Symmetric facies Data Reviewed:  Labs on Admission:  Results for orders placed or performed during the hospital encounter of 04/06/23 (from the past 24 hour(s))  CBC with Differential/Platelet     Status: Abnormal   Collection Time: 04/06/23 11:41 AM  Result Value Ref Range   WBC 9.5 4.0 - 10.5 K/uL   RBC 4.14 (L) 4.22 - 5.81 MIL/uL   Hemoglobin 12.5 (L) 13.0 - 17.0 g/dL   HCT 40.9 (L) 81.1 - 91.4 %   MCV 89.4 80.0 - 100.0 fL   MCH 30.2 26.0 - 34.0 pg   MCHC 33.8 30.0 - 36.0 g/dL   RDW 78.2 95.6 - 21.3 %   Platelets 176 150 - 400 K/uL   nRBC 0.0 0.0 - 0.2 %   Neutrophils Relative % 71 %   Neutro Abs 6.7 1.7 - 7.7 K/uL   Lymphocytes Relative 17 %   Lymphs Abs 1.6 0.7 - 4.0 K/uL   Monocytes Relative 11 %   Monocytes Absolute 1.0 0.1 - 1.0 K/uL   Eosinophils Relative 1 %   Eosinophils Absolute 0.1 0.0 - 0.5 K/uL   Basophils Relative 0 %   Basophils Absolute 0.0 0.0 - 0.1 K/uL   Immature Granulocytes 0 %   Abs Immature  Granulocytes 0.04 0.00 - 0.07 K/uL  Basic metabolic panel     Status: Abnormal   Collection Time: 04/06/23 11:41 AM  Result Value Ref Range   Sodium 135 135 - 145 mmol/L   Potassium 3.4 (L) 3.5 - 5.1 mmol/L   Chloride 101 98 - 111 mmol/L   CO2 25 22 - 32 mmol/L   Glucose, Bld 174 (H) 70 - 99 mg/dL   BUN 18 8 - 23 mg/dL   Creatinine, Ser 0.86 0.61 - 1.24 mg/dL   Calcium 9.7 8.9 - 57.8 mg/dL   GFR, Estimated >46 >96 mL/min   Anion gap 9 5 - 15  Protime-INR     Status: None   Collection Time: 04/06/23  4:24 PM  Result Value Ref Range   Prothrombin Time 14.8 11.4 - 15.2 seconds   INR 1.1 0.8 - 1.2  APTT     Status: None   Collection Time: 04/06/23  4:24 PM  Result Value Ref Range   aPTT 30 24 - 36 seconds  Urine rapid drug screen (hosp performed)     Status: None   Collection Time: 04/06/23  5:04 PM  Result Value Ref Range   Opiates NONE DETECTED NONE DETECTED   Cocaine NONE DETECTED NONE DETECTED   Benzodiazepines NONE DETECTED NONE DETECTED   Amphetamines NONE DETECTED NONE DETECTED   Tetrahydrocannabinol NONE DETECTED NONE DETECTED   Barbiturates NONE DETECTED NONE DETECTED  Urinalysis, Routine w reflex microscopic -Urine, Clean Catch     Status: None   Collection Time: 04/06/23  5:04 PM  Result Value Ref Range   Color, Urine YELLOW YELLOW   APPearance CLEAR CLEAR   Specific Gravity, Urine 1.020 1.005 - 1.030   pH 7.0 5.0 - 8.0   Glucose, UA NEGATIVE NEGATIVE mg/dL   Hgb urine dipstick NEGATIVE NEGATIVE   Bilirubin Urine NEGATIVE NEGATIVE   Ketones, ur NEGATIVE NEGATIVE mg/dL   Protein, ur NEGATIVE NEGATIVE mg/dL   Nitrite NEGATIVE  NEGATIVE   Leukocytes,Ua NEGATIVE NEGATIVE  I-stat chem 8, ED     Status: Abnormal   Collection Time: 04/06/23  5:50 PM  Result Value Ref Range   Sodium 140 135 - 145 mmol/L   Potassium 3.4 (L) 3.5 - 5.1 mmol/L   Chloride 98 98 - 111 mmol/L   BUN 15 8 - 23 mg/dL   Creatinine, Ser 0.27 0.61 - 1.24 mg/dL   Glucose, Bld 98 70 - 99 mg/dL    Calcium, Ion 2.53 6.64 - 1.40 mmol/L   TCO2 28 22 - 32 mmol/L   Hemoglobin 11.9 (L) 13.0 - 17.0 g/dL   HCT 40.3 (L) 47.4 - 25.9 %   Basic Metabolic Panel: Recent Labs  Lab 04/06/23 1141 04/06/23 1750  NA 135 140  K 3.4* 3.4*  CL 101 98  CO2 25  --   GLUCOSE 174* 98  BUN 18 15  CREATININE 0.89 1.00  CALCIUM 9.7  --    Liver Function Tests: No results for input(s): "AST", "ALT", "ALKPHOS", "BILITOT", "PROT", "ALBUMIN" in the last 168 hours. No results for input(s): "LIPASE", "AMYLASE" in the last 168 hours. No results for input(s): "AMMONIA" in the last 168 hours. CBC: Recent Labs  Lab 04/06/23 1141 04/06/23 1750  WBC 9.5  --   NEUTROABS 6.7  --   HGB 12.5* 11.9*  HCT 37.0* 35.0*  MCV 89.4  --   PLT 176  --    Cardiac Enzymes: No results for input(s): "CKTOTAL", "CKMB", "CKMBINDEX", "TROPONINIHS" in the last 168 hours.  BNP (last 3 results) No results for input(s): "PROBNP" in the last 8760 hours. CBG: No results for input(s): "GLUCAP" in the last 168 hours.  Radiological Exams on Admission:  CT ANGIO HEAD NECK W WO CM  Result Date: 04/06/2023 CLINICAL DATA:  Stroke/TIA, determine embolic source. EXAM: CT ANGIOGRAPHY HEAD AND NECK WITH AND WITHOUT CONTRAST TECHNIQUE: Multidetector CT imaging of the head and neck was performed using the standard protocol during bolus administration of intravenous contrast. Multiplanar CT image reconstructions and MIPs were obtained to evaluate the vascular anatomy. Carotid stenosis measurements (when applicable) are obtained utilizing NASCET criteria, using the distal internal carotid diameter as the denominator. RADIATION DOSE REDUCTION: This exam was performed according to the departmental dose-optimization program which includes automated exposure control, adjustment of the mA and/or kV according to patient size and/or use of iterative reconstruction technique. CONTRAST:  75mL OMNIPAQUE IOHEXOL 350 MG/ML SOLN COMPARISON:  CTA head/neck  Oct 08, 2022. FINDINGS: CT HEAD FINDINGS Brain: No evidence of acute large vascular territory infarction, hemorrhage, hydrocephalus, extra-axial collection or mass lesion/mass effect. Vascular: See below. Skull: No acute fracture. Sinuses/Orbits: Clear sinuses.  No acute orbital findings. Review of the MIP images confirms the above findings CTA NECK FINDINGS Aortic arch: Great vessel origins are patent without significant stenosis. Right carotid system: No evidence of dissection, stenosis (50% or greater), or occlusion. Left carotid system: No evidence of dissection, stenosis (50% or greater), or occlusion. Vertebral arteries: Mild narrowing of the right vertebral artery proximally. No evidence of dissection, stenosis (50% or greater), or occlusion. Skeleton: No acute abnormality on limited assessment. Other neck: No acute abnormality on limited assessment. Upper chest: Visualized lung apices are clear. Review of the MIP images confirms the above findings CTA HEAD FINDINGS Anterior circulation: Bilateral intracranial ICAs, MCAs, and ACAs are patent without proximal hemodynamically significant stenosis. Similar diminutive distal right ACA. Posterior circulation: Bilateral intradural vertebral arteries, basilar artery and bilateral posterior cerebral arteries are  patent without proximal hemodynamically significant stenosis. Venous sinuses: As permitted by contrast timing, patent. Review of the MIP images confirms the above findings IMPRESSION: No emergent large vessel occlusion or proximal high-grade stenosis. Similar diminutive distal right ACA. Electronically Signed   By: Feliberto Harts M.D.   On: 04/06/2023 17:01   MR Brain Wo Contrast (neuro protocol)  Result Date: 04/06/2023 CLINICAL DATA:  Headache, neuro deficit. Dizziness. Unsteady gait. History of stroke. EXAM: MRI HEAD WITHOUT CONTRAST TECHNIQUE: Multiplanar, multiecho pulse sequences of the brain and surrounding structures were obtained without  intravenous contrast. COMPARISON:  Head MRI 10/08/2022 FINDINGS: Image quality is degraded by motion artifact and reduced signal to noise primarily anteriorly. Brain: There are small acute to subacute cortical and subcortical infarcts in the left frontal and parietal lobes, and there is also a small acute to early subacute infarct involving the periventricular white matter in the posterior right temporal lobe. There is mild encephalomalacia medially in the left frontoparietal region corresponding to an area of acute infarction on the prior MRI. T2 hyperintensities elsewhere in the cerebral white matter are similar to the prior study and nonspecific but compatible with mild chronic small vessel ischemic disease. A small chronic infarct is again seen involving the right frontal periventricular white matter, and there may be 1 or 2 tiny chronic right cerebellar infarcts. There is mild cerebral atrophy. No intracranial hemorrhage, mass, midline shift, or extra-axial fluid collection is identified. Vascular: Major intracranial vascular flow voids are preserved. Skull and upper cervical spine: Unremarkable bone marrow signal. Sinuses/Orbits: Unremarkable orbits. Mild mucosal thickening in the ethmoid sinuses. Clear mastoid air cells. Other: None. IMPRESSION: 1. Small acute to subacute infarcts in the left frontal and parietal lobes and right temporal white matter. 2. Mild chronic small vessel ischemic disease. Electronically Signed   By: Sebastian Ache M.D.   On: 04/06/2023 16:14    EKG: Independently reviewed. 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  Patient now presents with bilateral anterior as well as posterior circulation cerebral infarcts with nonspecific symptoms of "dizziness ".  MRI imaging does have some artifact on image review.  But infarcted sites have been marked by the radiologist.  Which shows  increased intensity on DWI imaging, although less than typically seen.  CAT scan angiogram of the head and neck area is nonactionable.  Echo is pending.  Consideration can be given to dedicated angiography of the torso once current contrast load has washed out.  I will treat the patient with single dose of rectal aspirin for this evening.  Continue with atorvastatin.  We will liberalize patient's blood pressure control for the next 24 to 48 hours.  Patient does not smoke per history, U tox is negative.  Swallow screen will be done and diet advanced accordingly.        Advance Care Planning:   Code Status: Full Code discussed with paitnet.  Consults: neuro consult called by ER attendign with Dr. Wilford Corner  Family Communication: secretary at bedside. Patient advises her as close as "family", all questinos answered.  Severity of Illness: The appropriate patient status for this patient is INPATIENT. Inpatient status is judged to be reasonable and necessary in order to provide the required intensity of service to ensure the patient's safety. The patient's presenting symptoms, physical exam findings, and initial radiographic and laboratory data in the context of their chronic comorbidities is felt to  place them at high risk for further clinical deterioration. Furthermore, it is not anticipated that the patient will be medically stable for discharge from the hospital within 2 midnights of admission.   * I certify that at the point of admission it is my clinical judgment that the patient will require inpatient hospital care spanning beyond 2 midnights from the point of admission due to high intensity of service, high risk for further deterioration and high frequency of surveillance required.*  Author: Nolberto Hanlon, MD 04/06/2023 5:49 PM  For on call review [email or web address].

## 2023-04-06 NOTE — ED Notes (Signed)
Patient transported to MRI 

## 2023-04-06 NOTE — ED Provider Notes (Signed)
[place] EMERGENCY DEPARTMENT AT [facility] Provider Note   CSN: [id number] Arrival date & time: 04/06/23  1100     History  Chief Complaint  Patient presents with   Gait Problem   Dizziness    Kenneth [name] is a 72 y.o. male.  72 year old male with prior history of stroke presents with dizziness which began this morning when he awoke.  Also notes ataxia as well to.  Denies any headache.  Did she start Prozac yesterday.  Denies any focal weakness.  No emesis noted.  Dizziness is not worse with head movement.  No pressure same.  He is on Plavix       Home Medications Prior to Admission medications   Medication Sig Start Date End Date Taking? Authorizing Provider  atorvastatin (LIPITOR) 40 MG tablet Take 1 tablet (40 mg total) by mouth daily. 10/17/22   Love, Evlyn Kanner, PA-C  B-D UF III MINI PEN NEEDLES 31G X 5 MM MISC Inject 1 Stick as directed as directed. 11/08/15   [provider]  benazepril (LOTENSIN) 20 MG tablet Take 40 mg by mouth daily. 1/2 Pill DAY/NIGHT    [provider]  chlorthalidone (HYGROTON) 25 MG tablet Take 25 mg by mouth daily.    [provider]  clopidogrel (PLAVIX) 75 MG tablet TAKE 1 TABLET(75 MG) BY MOUTH DAILY 11/24/22   Raulkar, Drema Pry, MD  Continuous Glucose Sensor (FREESTYLE LIBRE 14 DAY SENSOR) MISC by Does not apply route.    [provider]  denosumab (PROLIA) 60 MG/ML SOSY injection Inject 60 mg into the skin every 6 (six) months.    [provider]  ezetimibe (ZETIA) 10 MG tablet Take 10 mg by mouth daily. 01/19/20   [provider]  MOUNJARO 5 MG/0.5ML Pen Inject 5 mg into the skin once a week.    [provider]  TOUJEO SOLOSTAR 300 UNIT/ML Solostar Pen Inject 30 Units into the skin daily. Patient taking differently: Inject 36 Units into the skin daily. 10/17/22   Love, Evlyn Kanner, PA-C  VITAMIN D PO Take 1 tablet by mouth 2 (two) times daily. Twice a day pr patient     [provider]      Allergies    Jardiance [empagliflozin] and Penicillins    Review of Systems   Review of Systems  All other systems reviewed and are negative.   Physical Exam Updated Vital Signs BP (!) 124/59 (BP Location: Right Arm)   Pulse (!) 56   Temp 97.6 F (36.4 C) (Oral)   Resp 19   Ht 1.803 m (5\' 11" )   Wt 98.4 kg   SpO2 98%   BMI 30.27 kg/m  Physical Exam Vitals and nursing note reviewed.  Constitutional:      General: He is not in acute distress.    Appearance: Normal appearance. He is well-developed. He is not toxic-appearing.  HENT:     Head: Normocephalic and atraumatic.  Eyes:     General: Lids are normal.     Conjunctiva/sclera: Conjunctivae normal.     Pupils: Pupils are equal, round, and reactive to light.  Neck:     Thyroid: No thyroid mass.     Trachea: No tracheal deviation.  Cardiovascular:     Rate and Rhythm: Normal rate and regular rhythm.     Heart sounds: Normal heart sounds. No murmur heard.    No gallop.  Pulmonary:     Effort: Pulmonary effort is normal. No respiratory  distress.     Breath sounds: Normal breath sounds. No stridor. No decreased breath sounds, wheezing, rhonchi or rales.  Abdominal:     General: There is no distension.     Palpations: Abdomen is soft.     Tenderness: There is no abdominal tenderness. There is no rebound.  Musculoskeletal:        General: No tenderness. Normal range of motion.     Cervical back: Normal range of motion and neck supple.  Skin:    General: Skin is warm and dry.     Findings: No abrasion or rash.  Neurological:     General: No focal deficit present.     Mental Status: He is alert and oriented to person, place, and time. Mental status is at baseline.     GCS: GCS eye subscore is 4. GCS verbal subscore is 5. GCS motor subscore is 6.     Cranial Nerves: Cranial nerves are intact. No cranial nerve deficit.     Sensory: No sensory deficit.     Motor: Motor function is intact.   Psychiatric:        Attention and Perception: Attention normal.        Speech: Speech normal.        Behavior: Behavior normal.     ED Results / Procedures / Treatments   Labs (all labs ordered are listed, but only abnormal results are displayed) Labs Reviewed  CBC WITH DIFFERENTIAL/PLATELET  BASIC METABOLIC PANEL    EKG EKG Interpretation Date/Time:  Monday April 06 2023 11:29:37 EST Ventricular Rate:  56 PR Interval:  197 QRS Duration:  107 QT Interval:  418 QTC Calculation: 404 R Axis:   47  Text Interpretation: Sinus rhythm Low voltage, precordial leads Confirmed by Lorre Nick ([id number]) on 04/06/2023 11:32:59 AM  Radiology No results found.  Procedures Procedures    Medications Ordered in ED Medications - No data to display  ED Course/ Medical Decision Making/ A&P                                 Medical Decision Making Amount and/or Complexity of Data Reviewed Labs: ordered. Radiology: ordered.   Patient is EKG per my interpretation shows normal sinus rhythm.  Patient has a last seen normal over 12 hours ago.  His only symptoms now are some mild ataxia.  Patient had MRI of his brain which showed acute to subacute infarcts.  Discussed with Dr. Jerrell Belfast from neurology who recommends patient come to [facility] to be evaluated by them.  Family at bedside informed        Final Clinical Impression(s) / ED Diagnoses Final diagnoses:  None    Rx / DC Orders ED Discharge Orders     None         Lorre Nick, MD 04/06/23 1625

## 2023-04-06 NOTE — Assessment & Plan Note (Signed)
C.w. Tirzapatide, insulin glargine. Hold metformin. Start insulin sliding scale.

## 2023-04-06 NOTE — Assessment & Plan Note (Signed)
Hold benazepril and chlorthalidone X 24 hours.

## 2023-04-06 NOTE — Progress Notes (Signed)
Pt arrived to

## 2023-04-06 NOTE — Assessment & Plan Note (Addendum)
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  Patient now presents with bilateral anterior as well as posterior circulation cerebral infarcts with nonspecific symptoms of "dizziness ".  MRI imaging does have some artifact on image review.  But infarcted sites have been marked by the radiologist.  Which shows increased intensity on DWI imaging, although less than typically seen.  CAT scan angiogram of the head and neck area is nonactionable.  Echo is pending.  Consideration can be given to dedicated angiography of the torso once current contrast load has washed out.  I will treat the patient with single dose of rectal aspirin for this evening.  Continue with atorvastatin.  We will liberalize patient's blood pressure control for the next 24 to 48 hours.  Patient does not smoke per history, U tox is negative.  Swallow screen will be done and diet advanced accordingly.  C.w. home plavix.

## 2023-04-06 NOTE — ED Notes (Addendum)
[facility] arrived to pick up patient, shortly after report received from previous shift. Swallow screen completed by previous shift but unable to do additional assessments before patient left with [facility].

## 2023-04-06 NOTE — ED Notes (Signed)
ED TO INPATIENT HANDOFF REPORT  ED Nurse Name and Phone #: Jerline Pain Name/Age/Gender Kenneth [name] 72 y.o. male Room/Bed: WA16/WA16  Code Status   Code Status: Full Code  Home/SNF/Other Home Patient oriented to: self, place, time, and situation Is this baseline? Yes   Triage Complete: Triage complete  Chief Complaint Stroke (cerebrum) (HCC) [I63.9]  Triage Note Pt c/o unsteady gait x3 days and woke up w/ dizziness this morning.  Pt reports starting Prozac yesterday.  Pt went to bed around 2300 last night.     Pt had a stroke in mat 2024.    Pt reports he has been under a lot of stress.      Allergies Allergies  Allergen Reactions   Jardiance [Empagliflozin] Other (See Comments)    To the hospital- due to complications   Fluoxetine Other (See Comments)    Prefers to NOT take this   Penicillins Other (See Comments)    CHILDHOOD ALLERGY- exact reaction? Has patient had a PCN reaction causing immediate rash, facial/tongue/throat swelling, SOB or lightheadedness with hypotension: Unknown Has patient had a PCN reaction causing severe rash involving mucus membranes or skin necrosis: Unknown Has patient had a PCN reaction that required hospitalization: Unknown Has patient had a PCN reaction occurring within the last 10 years: Unknown If all of the above answers are "NO", then may proceed with Cephalosporin use.     Silodosin Other (See Comments)    Vertigo     Level of Care/Admitting Diagnosis ED Disposition     ED Disposition  Admit   Condition  --   Comment  Hospital Area: [facility] [[id number]]  Level of Care: Telemetry Medical [[id number]]  May admit patient to [facility] or [facility] if equivalent level of care is available:: No  Covid Evaluation: Asymptomatic - no recent exposure (last 10 days) testing not required  Diagnosis: Stroke (cerebrum) [facility]) [[id number]]  Admitting Physician: Nolberto Hanlon [[id number]]  Attending Physician: Nolberto Hanlon  [[id number]]  Certification:: I certify this patient will need inpatient services for at least 2 midnights  Expected Medical Readiness: 04/09/2023          B Medical/Surgery History 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   History of colon polyps - adenomas 04/03/2010   Hyperlipidemia    Hypertension    Iron deficiency anemia, unspecified    OSA (obstructive sleep apnea)    refusing CPAP   Pneumonia 03/2022   w/effusion   Prostate cancer (HCC)    Stroke [facility])    Past Surgical History:  Procedure Laterality Date   ANTERIOR CERVICAL DECOMP/DISCECTOMY FUSION N/A 10/01/2012   Procedure: ANTERIOR CERVICAL DECOMPRESSION/DISCECTOMY FUSION 2 LEVELS;  Surgeon: Tia Alert, MD;  Location: [facility] NEURO ORS;  Service: Neurosurgery;  Laterality: N/A;  Cervical five-six,Cervical six-seven   COLONOSCOPY  09/30/2010, 03/01/2014   diverticulosis, small internal hemorrhoids   COLONOSCOPY W/ POLYPECTOMY  04/03/2010   4 polyps 8-41mm, worst TV adenoma with high-grade dysplasia   LOOP RECORDER INSERTION N/A 10/10/2022   Procedure: LOOP RECORDER INSERTION;  Surgeon: Marinus Maw, MD;  Location: [facility] INVASIVE CV LAB;  Service: Cardiovascular;  Laterality: N/A;   NO PAST SURGERIES     POLYPECTOMY  POSTERIOR CERVICAL FUSION/FORAMINOTOMY Left 10/20/2012   Procedure: C/5-6,C/6-7 [place]ami/Multi level,POSTERIOR CERVICAL FUSION/FORAMINOTOMY C/4-7;  Surgeon: Tia Alert, MD;  Location: [facility] NEURO ORS;  Service: Neurosurgery;  Laterality: Left;  Cervical Five-Six, Cervical Six-Seven Laminectomies, Posterior Cervical Fusion/Foraminotomies Cervical Four through Seven.    TONSILLECTOMY       A IV Location/Drains/Wounds Patient Lines/Drains/Airways Status     Active Line/Drains/Airways     Name Placement date Placement time  Site Days   Peripheral IV 04/06/23 20 G 1" Left Antecubital 04/06/23  1136  Antecubital  less than 1            Intake/Output Last 24 hours No intake or output data in the 24 hours ending 04/06/23 1750  Labs/Imaging Results for orders placed or performed during the hospital encounter of 04/06/23 (from the past 48 hour(s))  CBC with Differential/Platelet     Status: Abnormal   Collection Time: 04/06/23 11:41 AM  Result Value Ref Range   WBC 9.5 4.0 - 10.5 K/uL   RBC 4.14 (L) 4.22 - 5.81 MIL/uL   Hemoglobin 12.5 (L) 13.0 - 17.0 g/dL   HCT 16.1 (L) 09.6 - 04.5 %   MCV 89.4 80.0 - 100.0 fL   MCH 30.2 26.0 - 34.0 pg   MCHC 33.8 30.0 - 36.0 g/dL   RDW 40.9 81.1 - 91.4 %   Platelets 176 150 - 400 K/uL   nRBC 0.0 0.0 - 0.2 %   Neutrophils Relative % 71 %   Neutro Abs 6.7 1.7 - 7.7 K/uL   Lymphocytes Relative 17 %   Lymphs Abs 1.6 0.7 - 4.0 K/uL   Monocytes Relative 11 %   Monocytes Absolute 1.0 0.1 - 1.0 K/uL   Eosinophils Relative 1 %   Eosinophils Absolute 0.1 0.0 - 0.5 K/uL   Basophils Relative 0 %   Basophils Absolute 0.0 0.0 - 0.1 K/uL   Immature Granulocytes 0 %   Abs Immature Granulocytes 0.04 0.00 - 0.07 K/uL    Comment: Performed at [facility], [address], [place], [place] [address]  Basic metabolic panel     Status: Abnormal   Collection Time: 04/06/23 11:41 AM  Result Value Ref Range   Sodium 135 135 - 145 mmol/L   Potassium 3.4 (L) 3.5 - 5.1 mmol/L   Chloride 101 98 - 111 mmol/L   CO2 25 22 - 32 mmol/L   Glucose, Bld 174 (H) 70 - 99 mg/dL    Comment: Glucose reference range applies only to samples taken after fasting for at least 8 hours.   BUN 18 8 - 23 mg/dL   Creatinine, Ser 6.21 0.61 - 1.24 mg/dL   Calcium 9.7 8.9 - 30.8 mg/dL   GFR, Estimated >65 >78 mL/min    Comment: (NOTE) Calculated using the CKD-EPI Creatinine Equation (2021)    Anion gap 9 5 - 15    Comment: Performed at [facility], [address], [place], [place] [address]  Protime-INR     Status: None   Collection Time: 04/06/23  4:24 PM  Result Value Ref Range   Prothrombin Time 14.8 11.4 - 15.2 seconds   INR 1.1 0.8 - 1.2    Comment: (NOTE) INR goal varies based on device and disease states. Performed at [facility], [address], [place], [place] [address]   APTT     Status: None   Collection Time: 04/06/23  4:24 PM  Result Value Ref Range  aPTT 30 24 - 36 seconds    Comment: Performed at [facility], [address], [place], [place] [address]  Urine rapid drug screen (hosp performed)     Status: None   Collection Time: 04/06/23  5:04 PM  Result Value Ref Range   Opiates NONE DETECTED NONE DETECTED   Cocaine NONE DETECTED NONE DETECTED   Benzodiazepines NONE DETECTED NONE DETECTED   Amphetamines NONE DETECTED NONE DETECTED   Tetrahydrocannabinol NONE DETECTED NONE DETECTED   Barbiturates NONE DETECTED NONE DETECTED    Comment: (NOTE) DRUG SCREEN FOR MEDICAL PURPOSES ONLY.  IF CONFIRMATION IS NEEDED FOR ANY PURPOSE, NOTIFY LAB WITHIN 5 DAYS.  LOWEST DETECTABLE LIMITS FOR URINE DRUG SCREEN Drug Class                     Cutoff (ng/mL) Amphetamine and metabolites    1000 Barbiturate and metabolites    200 Benzodiazepine                 200 Opiates and metabolites        300 Cocaine and metabolites        300 THC                            50 Performed at [facility], [address] [place], [place] [address]   Urinalysis, Routine w reflex microscopic -Urine, Clean Catch     Status: None   Collection Time: 04/06/23  5:04 PM  Result Value Ref Range   Color, Urine YELLOW YELLOW   APPearance CLEAR CLEAR   Specific Gravity, Urine 1.020 1.005 - 1.030   pH 7.0 5.0 - 8.0   Glucose, UA NEGATIVE NEGATIVE mg/dL   Hgb urine dipstick NEGATIVE NEGATIVE   Bilirubin Urine NEGATIVE NEGATIVE   Ketones, ur NEGATIVE NEGATIVE mg/dL   Protein, ur NEGATIVE  NEGATIVE mg/dL   Nitrite NEGATIVE NEGATIVE   Leukocytes,Ua NEGATIVE NEGATIVE    Comment: Performed at [facility], [address], [place], [place] [address]   CT ANGIO HEAD NECK W WO CM  Result Date: 04/06/2023 CLINICAL DATA:  Stroke/TIA, determine embolic source. EXAM: CT ANGIOGRAPHY HEAD AND NECK WITH AND WITHOUT CONTRAST TECHNIQUE: Multidetector CT imaging of the head and neck was performed using the standard protocol during bolus administration of intravenous contrast. Multiplanar CT image reconstructions and MIPs were obtained to evaluate the vascular anatomy. Carotid stenosis measurements (when applicable) are obtained utilizing NASCET criteria, using the distal internal carotid diameter as the denominator. RADIATION DOSE REDUCTION: This exam was performed according to the departmental dose-optimization program which includes automated exposure control, adjustment of the mA and/or kV according to patient size and/or use of iterative reconstruction technique. CONTRAST:  75mL OMNIPAQUE IOHEXOL 350 MG/ML SOLN COMPARISON:  CTA head/neck Oct 08, 2022. FINDINGS: CT HEAD FINDINGS Brain: No evidence of acute large vascular territory infarction, hemorrhage, hydrocephalus, extra-axial collection or mass lesion/mass effect. Vascular: See below. Skull: No acute fracture. Sinuses/Orbits: Clear sinuses.  No acute orbital findings. Review of the MIP images confirms the above findings CTA NECK FINDINGS Aortic arch: Great vessel origins are patent without significant stenosis. Right carotid system: No evidence of dissection, stenosis (50% or greater), or occlusion. Left carotid system: No evidence of dissection, stenosis (50% or greater), or occlusion. Vertebral arteries: Mild narrowing of the right vertebral artery proximally. No evidence of dissection, stenosis (50% or greater), or occlusion. Skeleton: No acute  abnormality on limited assessment. Other neck: No acute abnormality on limited  assessment. Upper chest: Visualized lung apices are clear. Review of the MIP images confirms the above findings CTA HEAD FINDINGS Anterior circulation: Bilateral intracranial ICAs, MCAs, and ACAs are patent without proximal hemodynamically significant stenosis. Similar diminutive distal right ACA. Posterior circulation: Bilateral intradural vertebral arteries, basilar artery and bilateral posterior cerebral arteries are patent without proximal hemodynamically significant stenosis. Venous sinuses: As permitted by contrast timing, patent. Review of the MIP images confirms the above findings IMPRESSION: No emergent large vessel occlusion or proximal high-grade stenosis. Similar diminutive distal right ACA. Electronically Signed   By: Feliberto Harts M.D.   On: 04/06/2023 17:01   MR Brain Wo Contrast (neuro protocol)  Result Date: 04/06/2023 CLINICAL DATA:  Headache, neuro deficit. Dizziness. Unsteady gait. History of stroke. EXAM: MRI HEAD WITHOUT CONTRAST TECHNIQUE: Multiplanar, multiecho pulse sequences of the brain and surrounding structures were obtained without intravenous contrast. COMPARISON:  Head MRI 10/08/2022 FINDINGS: Image quality is degraded by motion artifact and reduced signal to noise primarily anteriorly. Brain: There are small acute to subacute cortical and subcortical infarcts in the left frontal and parietal lobes, and there is also a small acute to early subacute infarct involving the periventricular white matter in the posterior right temporal lobe. There is mild encephalomalacia medially in the left frontoparietal region corresponding to an area of acute infarction on the prior MRI. T2 hyperintensities elsewhere in the cerebral white matter are similar to the prior study and nonspecific but compatible with mild chronic small vessel ischemic disease. A small chronic infarct is again seen involving the right frontal periventricular white matter, and there may be 1 or 2 tiny chronic right  cerebellar infarcts. There is mild cerebral atrophy. No intracranial hemorrhage, mass, midline shift, or extra-axial fluid collection is identified. Vascular: Major intracranial vascular flow voids are preserved. Skull and upper cervical spine: Unremarkable bone marrow signal. Sinuses/Orbits: Unremarkable orbits. Mild mucosal thickening in the ethmoid sinuses. Clear mastoid air cells. Other: None. IMPRESSION: 1. Small acute to subacute infarcts in the left frontal and parietal lobes and right temporal white matter. 2. Mild chronic small vessel ischemic disease. Electronically Signed   By: Sebastian Ache M.D.   On: 04/06/2023 16:14    Pending Labs Unresulted Labs (From admission, onward)     Start     Ordered   04/13/23 0500  Creatinine, serum  (enoxaparin (LOVENOX)    CrCl >/= 30 ml/min)  Weekly,   R     Comments: while on enoxaparin therapy    04/06/23 1732   04/07/23 0500  Lipid panel  (Labs)  Tomorrow morning,   R       Comments: Fasting    04/06/23 1732   04/07/23 0500  CBC  Daily,   R      04/06/23 1732   04/07/23 0500  Basic metabolic panel  Tomorrow morning,   R        04/06/23 1732   04/06/23 1730  Creatinine, serum  (enoxaparin (LOVENOX)    CrCl >/= 30 ml/min)  Once,   R       Comments: Baseline for enoxaparin therapy IF NOT ALREADY DRAWN.    04/06/23 1732   04/06/23 1720  CBC  Once,   STAT        04/06/23 1720   04/06/23 1720  Differential  Once,   R        04/06/23 1720   04/06/23 1624  Ethanol  Once,   URGENT        04/06/23 1623            Vitals/Pain Today's Vitals   04/06/23 1129 04/06/23 1415 04/06/23 1430 04/06/23 1616  BP: (!) 124/59 (!) 110/50 (!) 123/57   Pulse: (!) 56 (!) 49 (!) 51   Resp: 19 14 15    Temp: 97.6 F (36.4 C)   (!) 97.4 F (36.3 C)  TempSrc: Oral   Oral  SpO2: 98% 100% 100%   Weight:      Height:      PainSc:        Isolation Precautions No active isolations  Medications Medications   stroke: early stages of recovery book (has  no administration in time range)  acetaminophen (TYLENOL) tablet 650 mg (has no administration in time range)    Or  acetaminophen (TYLENOL) 160 MG/5ML solution 650 mg (has no administration in time range)    Or  acetaminophen (TYLENOL) suppository 650 mg (has no administration in time range)  senna-docusate (Senokot-S) tablet 1 tablet (has no administration in time range)  enoxaparin (LOVENOX) injection 40 mg (has no administration in time range)  iohexol (OMNIPAQUE) 350 MG/ML injection 75 mL (75 mLs Intravenous Contrast Given 04/06/23 1631)    Mobility walks     Focused Assessments Neuro Assessment Handoff:  Swallow screen pass? Yes      Last date known well: 04/05/23 Last time known well: 2300 Neuro Assessment:   Neuro Checks:      Has TPA been given? No If patient is a Neuro Trauma and patient is going to OR before floor call report to 4N Charge nurse: [phone] or [phone]   R Recommendations: See Admitting Provider Note  Report given to:   Additional Notes: Aox4, 20LAC saline locked, continent,

## 2023-04-07 ENCOUNTER — Inpatient Hospital Stay (HOSPITAL_COMMUNITY): Payer: Medicare Other

## 2023-04-07 ENCOUNTER — Other Ambulatory Visit (HOSPITAL_COMMUNITY): Payer: Self-pay

## 2023-04-07 DIAGNOSIS — I639 Cerebral infarction, unspecified: Secondary | ICD-10-CM | POA: Diagnosis not present

## 2023-04-07 DIAGNOSIS — I63322 Cerebral infarction due to thrombosis of left anterior cerebral artery: Secondary | ICD-10-CM

## 2023-04-07 DIAGNOSIS — I6389 Other cerebral infarction: Secondary | ICD-10-CM | POA: Diagnosis not present

## 2023-04-07 LAB — HEPATIC FUNCTION PANEL
ALT: 16 U/L (ref 0–44)
AST: 17 U/L (ref 15–41)
Albumin: 3.1 g/dL — ABNORMAL LOW (ref 3.5–5.0)
Alkaline Phosphatase: 38 U/L (ref 38–126)
Bilirubin, Direct: 0.2 mg/dL (ref 0.0–0.2)
Indirect Bilirubin: 1 mg/dL — ABNORMAL HIGH (ref 0.3–0.9)
Total Bilirubin: 1.2 mg/dL — ABNORMAL HIGH (ref <1.2)
Total Protein: 5.9 g/dL — ABNORMAL LOW (ref 6.5–8.1)

## 2023-04-07 LAB — BASIC METABOLIC PANEL
Anion gap: 9 (ref 5–15)
BUN: 12 mg/dL (ref 8–23)
CO2: 28 mmol/L (ref 22–32)
Calcium: 9.3 mg/dL (ref 8.9–10.3)
Chloride: 101 mmol/L (ref 98–111)
Creatinine, Ser: 1.08 mg/dL (ref 0.61–1.24)
GFR, Estimated: >60 mL/min (ref >60)
Glucose, Bld: 108 mg/dL — ABNORMAL HIGH (ref 70–99)
Potassium: 3.7 mmol/L (ref 3.5–5.1)
Sodium: 138 mmol/L (ref 135–145)

## 2023-04-07 LAB — GLUCOSE, CAPILLARY
Glucose-Capillary: 107 mg/dL — ABNORMAL HIGH (ref 70–99)
Glucose-Capillary: 125 mg/dL — ABNORMAL HIGH (ref 70–99)
Glucose-Capillary: 94 mg/dL (ref 70–99)
Glucose-Capillary: 101 mg/dL — ABNORMAL HIGH (ref 70–99)

## 2023-04-07 LAB — CBC
HCT: 36 % — ABNORMAL LOW (ref 39.0–52.0)
Hemoglobin: 12.1 g/dL — ABNORMAL LOW (ref 13.0–17.0)
MCH: 30 pg (ref 26.0–34.0)
MCHC: 33.6 g/dL (ref 30.0–36.0)
MCV: 89.3 fL (ref 80.0–100.0)
Platelets: 171 10*3/uL (ref 150–400)
RBC: 4.03 MIL/uL — ABNORMAL LOW (ref 4.22–5.81)
RDW: 12.4 % (ref 11.5–15.5)
WBC: 8.6 10*3/uL (ref 4.0–10.5)
nRBC: 0 % (ref 0.0–0.2)

## 2023-04-07 LAB — HEMOGLOBIN A1C
Hgb A1c MFr Bld: 8.2 % — ABNORMAL HIGH (ref 4.8–5.6)
Mean Plasma Glucose: 188.64 mg/dL

## 2023-04-07 LAB — LIPID PANEL
Cholesterol: 79 mg/dL (ref 0–200)
HDL: 17 mg/dL — ABNORMAL LOW (ref >40)
LDL Cholesterol: 47 mg/dL (ref 0–99)
Total CHOL/HDL Ratio: 4.6 {ratio}
Triglycerides: 75 mg/dL (ref <150)
VLDL: 15 mg/dL (ref 0–40)

## 2023-04-07 LAB — ECHOCARDIOGRAM COMPLETE
Height: 71 in
S' Lateral: 3.2 cm
Weight: 3472 [oz_av]

## 2023-04-07 MED ORDER — ORAL CARE MOUTH RINSE: 15.0000 mL | OROMUCOSAL | Status: DC | PRN

## 2023-04-07 MED ORDER — ASPIRIN 81 MG PO CHEW
81.0000 mg | CHEWABLE_TABLET | Freq: Every day | ORAL | Status: DC
  Administered 2023-04-08: 81 mg via ORAL
  Filled 2023-04-07: qty 1

## 2023-04-07 MED ORDER — TICAGRELOR 90 MG PO TABS
90.0000 mg | ORAL_TABLET | Freq: Two times a day (BID) | ORAL | Status: DC
  Administered 2023-04-08: 90 mg via ORAL
  Filled 2023-04-07: qty 1

## 2023-04-07 NOTE — Consult Note (Signed)
NEUROLOGY CONSULT NOTE   Date of service: April 07, 2023 Patient Name: Kenneth [name] MRN:  [id number] DOB:  [birth year] Chief Complaint: "Dizziness" Requesting Provider: Nolberto Hanlon, MD  History of Present Illness  Kenneth [name] is a 72 y.o. 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  3 weeks ago he had a tooth extraction done for pain.  No concern for infection, no fevers etc.  2 weeks ago he began to notice worsening dizziness/unsteadiness.  On 11/25 morning he felt much worse and therefore came to the ED for evaluation where MRI revealed multifocal small strokes for which neurology was consulted.  He notes he has been taking aspirin and Plavix at home, aspirin for pain control due to his recent tooth extraction for the past week, but as he was wanted to avoid opiate medications.  LKW: 1/24 evening Modified rankin score: 0-1 IV Thrombolysis: No, out of the window EVT: No, exam not consistent with LVO   NIHSS components Score: Comment  1a Level of Conscious 0[x]  1[]  2[]  3[]      1b LOC Questions 0[x]  1[]  2[]       1c LOC Commands 0[x]  1[]  2[]       2 Best Gaze 0[x]  1[]  2[]       3 Visual 0[x]  1[]  2[]  3[]      4 Facial Palsy 0[]  1[x]  2[]  3[]    Mild right facial droop  5a Motor Arm - left 0[x]  1[]  2[]  3[]  4[]  UN[]    5b Motor Arm - Right 0[x]  1[]  2[]  3[]  4[]  UN[]  Mild right upper extremity pronation without drift  6a Motor Leg - Left 0[x]  1[]  2[]  3[]  4[]  UN[]    6b Motor Leg - Right 0[x]  1[]  2[]  3[]  4[]  UN[]    7 Limb Ataxia 0[x]  1[]  2[]  3[]  UN[]     8 Sensory 0[x]  1[]  2[]  UN[]      9 Best Language 0[x]  1[]  2[]  3[]      10 Dysarthria 0[x]  1[]  2[]  UN[]      11 Extinct. 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   History of colon polyps - adenomas 04/03/2010   Hyperlipidemia    Hypertension    Iron deficiency anemia, unspecified    OSA (obstructive sleep apnea)    refusing CPAP   Pneumonia 03/2022   w/effusion   Prostate cancer (HCC)    Stroke [facility])     Past Surgical History:  Procedure Laterality Date   ANTERIOR CERVICAL DECOMP/DISCECTOMY FUSION N/A 10/01/2012   Procedure: ANTERIOR CERVICAL DECOMPRESSION/DISCECTOMY FUSION 2 LEVELS;  Surgeon: Tia Alert, MD;  Location: [facility]UR[facility];  Service: Neurosurgery;  Laterality: N/A;  Cervical five-six,Cervical six-seven   COLONOSCOPY  09/30/2010, 03/01/2014   diverticulosis, small internal hemorrhoids   COLONOSCOPY W/ POLYPECTOMY  04/03/2010   4 polyps 8-17mm, worst TV adenoma  with high-grade dysplasia   LOOP RECORDER INSERTION N/A 10/10/2022   Procedure: LOOP RECORDER INSERTION;  Surgeon: Marinus Maw, MD;  Location: [facility] INVASIVE CV LAB;  Service: Cardiovascular;  Laterality: N/A;   NO PAST SURGERIES     POLYPECTOMY     POSTERIOR CERVICAL FUSION/FORAMINOTOMY Left 10/20/2012   Procedure: C/5-6,C/6-7 Lami/Multi level,POSTERIOR CERVICAL FUSION/FORAMINOTOMY C/4-7;  Surgeon: Tia Alert, MD;  Location: [facility] NEURO OR[facility];  Service: Neurosurgery;  Laterality: Left;  Cervical Five-Six, Cervical Six-Seven Laminectomies, Posterior Cervical Fusion/Foraminotomies Cervical Four through Seven.    TONSILLECTOMY      Family History: Family History  Problem Relation Age of Onset   Diabetes Mother    Stomach cancer Neg Hx    Rectal cancer Neg Hx    Pancreatic cancer Neg Hx    Colon cancer Neg Hx    Colon polyps  Neg Hx    Esophageal cancer Neg Hx    Prostate cancer Neg Hx    Breast cancer Neg Hx     Social History  reports that he quit smoking about 53 years ago. His smoking use included cigarettes. He started smoking about 55 years ago. He has a 0.5 pack-year smoking history. He has never used smokeless tobacco. He reports that he does not drink alcohol and does not use drugs.  Allergies  Allergen Reactions   Jardiance [Empagliflozin] Other (See Comments)    To the hospital- due to complications   Fluoxetine Other (See Comments)    Prefers to NOT take this   Penicillins Other (See Comments)    CHILDHOOD ALLERGY- exact reaction? Has patient had a PCN reaction causing immediate rash, facial/tongue/throat swelling, SOB or lightheadedness with hypotension: Unknown Has patient had a PCN reaction causing severe rash involving mucus membranes or skin necrosis: Unknown Has patient had a PCN reaction that required hospitalization: Unknown Has patient had a PCN reaction occurring within the last 10 years: Unknown If all of the above answers are "NO", then may proceed with Cephalosporin use.     Silodosin Other (See Comments)    Vertigo     Medications   Current Facility-Administered Medications:     stroke: early stages of recovery book, , Does not apply, Once, Nolberto Hanlon, MD   acetaminophen (TYLENOL) tablet 650 mg, 650 mg, Oral, Q4H PRN **OR** acetaminophen (TYLENOL) 160 MG/5ML solution 650 mg, 650 mg, Per Tube, Q4H PRN **OR** acetaminophen (TYLENOL) suppository 650 mg, 650 mg, Rectal, Q4H PRN, Nolberto Hanlon, MD   atorvastatin (LIPITOR) tablet 40 mg, 40 mg, Oral, QHS, Nolberto Hanlon, MD, 40 mg at 04/06/23 2307   cholecalciferol (VITAMIN D3) 25 MCG (1000 UNIT) tablet 1,000 Units, 1,000 Units, Oral, Daily, Nolberto Hanlon, MD   clopidogrel (PLAVIX) tablet 75 mg, 75 mg, Oral, q AM, Nolberto Hanlon, MD   enoxaparin (LOVENOX) injection 40 mg, 40 mg, Subcutaneous, Q24H, Nolberto Hanlon, MD, 40 mg at 04/06/23 2307    ezetimibe (ZETIA) tablet 10 mg, 10 mg, Oral, Daily, Nolberto Hanlon, MD   insulin aspart (novoLOG) injection 0-15 Units, 0-15 Units, Subcutaneous, TID WC, Nolberto Hanlon, MD   insulin aspart (novoLOG) injection 0-5 Units, 0-5 Units, Subcutaneous, QHS, Nolberto Hanlon, MD   insulin glargine-yfgn (SEMGLEE) injection 30 Units, 30 Units, Subcutaneous, Daily, Nolberto Hanlon, MD   senna-docusate (Senokot-S) tablet 1 tablet, 1 tablet, Oral, QHS PRN, Nolberto Hanlon, MD  Vitals    Basic Metabolic Panel: Recent Labs  Lab 04/06/23 1141 04/06/23 1750  NA 135 140  K 3.4* 3.4*  CL 101 98  CO2 25  --   GLUCOSE 174* 98  BUN 18 15  CREATININE 0.89 1.00  CALCIUM 9.7  --     CBC: Recent Labs  Lab April 24, 2023 1141 04-24-23 1750  WBC 9.5  --   NEUTROABS 6.7  --   HGB 12.5* 11.9*  HCT 37.0* 35.0*  MCV 89.4  --   PLT 176  --     Coagulation Studies: Recent Labs    Apr 24, 2023 1624  LABPROT 14.8  INR 1.1     Body mass index is 30.27 kg/m.  Physical Exam   Current vital signs: BP (!) 142/64 (BP Location: Right Arm)   Pulse (!) 54   Temp 98 F (36.7 C) (Oral)   Resp 16   Ht 5\' 11"  (1.803 m)   Wt 98.4 kg   SpO2 95%   BMI 30.27 kg/m  Vital signs in last 24 hours: Temp:  [97.4 F (36.3 C)-98.3 F (36.8 C)] 98 F (36.7 C) (11/26 0400) Pulse Rate:  [49-64] 54 (11/26 0400) Resp:  [14-19] 16 (11/26 0020) BP: (110-142)/(50-70) 142/64 (11/26 0400) SpO2:  [94 %-100 %] 95 % (11/26 0400) Weight:  [98.4 kg] 98.4 kg 2023-04-24 1119)   Constitutional: Appears well-developed and well-nourished.  Psych: Affect appropriate to situation.  Eyes: No scleral injection.  HENT: No OP obstruction.  Head: Normocephalic.  Cardiovascular: Normal rate and regular rhythm.  Respiratory: Effort normal, non-labored breathing.  GI: Soft.  No distension. There is no tenderness.  Skin: WDI.   Neurologic Examination   Physical Exam  Constitutional: Appears well-developed and well-nourished.  Psych: Affect appropriate  to situation Eyes: No scleral injection HENT: No OP obstrucion MSK: no joint deformities.  Cardiovascular: Normal rate and regular rhythm.  Respiratory: Effort normal, non-labored breathing GI: Soft.  No distension. There is no tenderness.  Skin: WDI  Neuro: Mental Status: Patient is awake, alert, oriented to person, place, month, year, and situation. Patient is able to give a clear and coherent history. No signs of aphasia or neglect Cranial Nerves: II: Visual Fields are full. Pupils are equal, round, and reactive to light.   III,IV, VI: EOMI without ptosis or diploplia.  V: Facial sensation is symmetric to temperature VII: Facial movement is notable for mild right facial droop at rest, symmetric on smiling VIII: hearing is extremely hard of hearing at baseline (stable), relies mainly on lipreading to communicate X: Uvula elevates symmetrically XI: Shoulder shrug is symmetric. XII: tongue is midline without atrophy or fasciculations.  Motor: Tone is normal. Bulk is normal. 5/5 strength was present in all four extremities, except for right hip flexor 4+/5, right knee flexion 4/5.  Pronation without drift was present in the right upper extremity Sensory: Sensation is symmetric to light touch and temperature in the arms and legs. Cerebellar: FNF and HKS are intact bilaterally  NIHSS total 1 Score breakdown: Mild right facial droop rest   Labs/Imaging/Neurodiagnostic studies   CBC:  Recent Labs  Lab 24-Apr-2023 1141 2023/04/24 1750  WBC 9.5  --   NEUTROABS 6.7  --   HGB 12.5* 11.9*  HCT 37.0* 35.0*  MCV 89.4  --   PLT 176  --    Basic Metabolic Panel:  Lab Results  Component Value Date   NA 140 04/24/2023   K 3.4 (L) 04-24-23   CO2 25 April 24, 2023   GLUCOSE 98 24-Apr-2023   BUN 15 04/24/2023   CREATININE 1.00 04-24-23   CALCIUM 9.7 2023/04/24   GFRNONAA >60 04-24-2023   GFRAA >  60 07/25/2017   Lipid Panel:  Lab Results  Component Value Date   LDLCALC 45  10/09/2022   HgbA1c:  Lab Results  Component Value Date   HGBA1C 5.6 10/09/2022   Urine Drug Screen:     Component Value Date/Time   LABOPIA NONE DETECTED 04/06/2023 1704   COCAINSCRNUR NONE DETECTED 04/06/2023 1704   LABBENZ NONE DETECTED 04/06/2023 1704   AMPHETMU NONE DETECTED 04/06/2023 1704   THCU NONE DETECTED 04/06/2023 1704   LABBARB NONE DETECTED 04/06/2023 1704    Alcohol Level     Component Value Date/Time   ETH <10 04/06/2023 1739   INR  Lab Results  Component Value Date   INR 1.1 04/06/2023   APTT  Lab Results  Component Value Date   APTT 30 04/06/2023    CT angio Head and Neck with contrast(Personally reviewed): No emergent large vessel occlusion or proximal high-grade stenosis. Similar diminutive distal right ACA. [Absent right ACOM, both ACAs supplied by the left carotid]   MRI Brain(Personally reviewed): 1. Small acute to subacute infarcts in the left frontal and parietal lobes and right temporal white matter. 2. Mild chronic small vessel ischemic disease.  ASSESSMENT   Kenneth [name] is a 71 y.o. with recent cryptogenic stroke in May of this year and now presenting with recurrent multifocal cryptogenic strokes.  Highly concerning for central embolic etiology.  If loop recorder remains negative for atrial fibrillation will need further investigation into conditions that can cause hypercoagulable state  RECOMMENDATIONS  # Multifocal strokes, small  - Stroke labs HgbA1c, fasting lipid panel - Frequent neuro checks - Echocardiogram - Continue plavix 75 mg daily for now, may stop if reason for anticoagulation is found - Risk factor modification - Telemetry monitoring; loop recorder interrogation  - If loop recorder negative, consider resending hypercoagulability workup, CT/Chest/abdomen/pelvis for malignancy screening etc - Blood pressure goal   - Normotension to be achieved gradually (out of window for permissive hypertension) - PT consult, OT  consult, Speech consult - Stroke team to follow  ______________________________________________________________________   Nunzio Cory MD-PhD [facility] [phone] Available 7 PM to 7 AM, outside of these hours please call Neurologist on call as listed on Amion.

## 2023-04-07 NOTE — TOC Benefit Eligibility Note (Signed)
Pharmacy Patient Advocate Encounter  Insurance verification completed.    The patient is insured through [employer] claim for [employer]. Currently a quantity of 60 is a 30 day supply and the co-pay is $0.00 .   This test claim was processed through [facility]- copay amounts may vary at other pharmacies due to pharmacy/plan contracts, or as the patient moves through the different stages of their insurance plan.

## 2023-04-07 NOTE — Evaluation (Signed)
Occupational Therapy Evaluation Patient Details Name: Kenneth [name] MRN: [id number] DOB: 03-11-1951 Today's Date: 04/07/2023   History of Present Illness Kenneth [name] is a 72 y.o. man who presented to ED after two-week hx of dizziness/unsteadiness.  MRI: Small acute to subacute infarcts in the left frontal and parietal  lobes and right temporal white matter. 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   Clinical Impression   Kenneth [name] was evaluated s/p the above admission list. He is indep at baseline. Upon evaluation the pt was limited by activity tolerance due to his L thigh cramping on every attempted to get OOB. Overall he completed bed mobility, transfers and LB ADLs without physical assist, CGA for safety only. Pt reports he has been using a SPC since yesterday for balance, otherwise has not needed DME PTA. Anticipate good progress acutely with potential to sign off next session after further education and ADL assessment. Pt will benefit from continued acute OT services and discharge home without OT services.        If plan is discharge home, recommend the following: Assist for transportation;Assistance with cooking/housework    Functional Status Assessment  Patient has had a recent decline in their functional status and demonstrates the ability to make significant improvements in function in a reasonable and predictable amount of time.  Equipment Recommendations  None recommended by OT       Precautions / Restrictions Precautions Precautions: Fall Restrictions Weight Bearing Restrictions: No      Mobility Bed Mobility Overal bed mobility: Needs Assistance Bed Mobility: Supine to Sit, Sit to Supine     Supine to sit: Supervision Sit to supine: Supervision   General bed mobility comments: incr  time and effort to get OOB    Transfers Overall transfer level: Needs assistance Equipment used: None Transfers: Sit to/from Stand Sit to Stand: Contact guard assist           General transfer comment: 3x, unable to progress to walking or prolonged standing due to reoccurring leg cramp      Balance Overall balance assessment: Needs assistance Sitting-balance support: Feet supported Sitting balance-Leahy Scale: Good     Standing balance support: No upper extremity supported, During functional activity Standing balance-Leahy Scale: Fair         ADL either performed or assessed with clinical judgement   ADL Overall ADL's : Needs assistance/impaired Eating/Feeding: Independent   Grooming: Set up;Sitting   Upper Body Bathing: Set up;Sitting   Lower Body Bathing: Contact guard assist;Sit to/from stand   Upper Body Dressing : Set up;Sitting   Lower Body Dressing: Contact guard assist;Sit to/from stand Lower Body Dressing Details (indicate cue type and reason): pt experienced L thigh cramp 3x after donning socks Toilet Transfer: Contact guard assist Toilet Transfer Details (indicate cue type and reason): limited amulation on eval, needs further assessment Toileting- Clothing Manipulation and Hygiene: Supervision/safety       Functional mobility during ADLs: Contact guard assist General ADL Comments: Pt reports he feels at baseline however has been unable to get OOB since being in the hospital. Evaluation limited due to pt having a severe L thigh cramp after donning his socks. Attempted standign 3x with return of cramp each time.     Vision Baseline Vision/History: 0 No visual deficits Vision Assessment?: No apparent visual deficits     Perception Perception: Within  Functional Limits       Praxis Praxis: WFL       Pertinent Vitals/Pain Pain Assessment Pain Assessment: Faces Faces Pain Scale: Hurts even more Pain Location: L thigh cramp Pain Descriptors /  Indicators: Discomfort, Grimacing Pain Intervention(s): Limited activity within patient's tolerance, Monitored during session     Extremity/Trunk Assessment Upper Extremity Assessment Upper Extremity Assessment: Overall WFL for tasks assessed   Lower Extremity Assessment Lower Extremity Assessment: Defer to PT evaluation   Cervical / Trunk Assessment Cervical / Trunk Assessment: Normal   Communication Communication Communication: Hearing impairment (read lips well)   Cognition Arousal: Alert Behavior During Therapy: WFL for tasks assessed/performed Overall Cognitive Status: Within Functional Limits for tasks assessed                   General Comments  VSS, hot packs applied to cramping area     Home Living Family/patient expects to be discharged to:: Private residence Living Arrangements: Spouse/significant other Available Help at Discharge: Available PRN/intermittently;Family Type of Home: House Home Access: Stairs to enter [employer] of Steps: 2 Entrance Stairs-Rails: Right Home Layout: Able to live on main level with bedroom/bathroom;Multi-level     Bathroom Shower/Tub: Producer, television/film/video: Standard     Home Equipment: BSC/3in1;Shower seat - built Charity fundraiser (2 wheels);Publishing rights manager - single point      Lives With: Spouse    Prior Functioning/Environment Prior Level of Function : Independent/Modified Independent;Driving;Working/employed             Mobility Comments: no AD, indep, active 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, indep ADL/IADLs        OT Problem List: Decreased range of motion;Decreased activity tolerance;Decreased safety awareness;Decreased knowledge of use of DME or AE      OT Treatment/Interventions: Self-care/ADL training;DME and/or AE instruction;Therapeutic activities    OT Goals(Current goals can be found in the care plan section) Acute Rehab OT Goals Patient Stated Goal: home soon OT Goal  Formulation: With patient Time For Goal Achievement: 04/21/23 Potential to Achieve Goals: Good ADL Goals Additional ADL Goal #1: pt will complete BADLs with mod I and LRAD  OT Frequency: Min 1X/week       AM-PAC OT "6 Clicks" Daily Activity     Outcome Measure Help from another person eating meals?: None Help from another person taking care of personal grooming?: A Little Help from another person toileting, which includes using toliet, bedpan, or urinal?: A Little Help from another person bathing (including washing, rinsing, drying)?: A Little Help from another person to put on and taking off regular upper body clothing?: A Little Help from another person to put on and taking off regular lower body clothing?: A Little 6 Click Score: 19   End of Session Equipment Utilized During Treatment: Gait belt Nurse Communication: Mobility status  Activity Tolerance: Patient limited by pain Patient left: in bed;with call bell/phone within reach;with bed alarm set  OT Visit Diagnosis: Other abnormalities of gait and mobility (R26.89);Muscle weakness (generalized) (M62.81);Unsteadiness on feet (R26.81)                Time: 1051-1110 OT Time Calculation (min): 19 min Charges:  OT General Charges $OT Visit: 1 Visit OT Evaluation $OT Eval Moderate Complexity: 1 Mod  Derenda Mis, OTR/L Acute Rehabilitation Services Office [phone] Secure Chat Communication Preferred   Donia Pounds 04/07/2023, 12:16 PM

## 2023-04-07 NOTE — Progress Notes (Signed)
*  PRELIMINARY RESULTS* Echocardiogram 2D Echocardiogram has been performed.  Kenneth [name] 04/07/2023, 10:10 AM

## 2023-04-07 NOTE — Evaluation (Signed)
Physical Therapy Evaluation Patient Details Name: Kenneth [name] MRN: [id number] DOB: January 20, 1951 Today's Date: 04/07/2023  History of Present Illness  Kenneth [name] is a 72 y.o. man who presented to ED on 04/07/23 after two-week hx of dizziness/unsteadiness.  MRI: Small acute to subacute infarcts in the left frontal and parietal  lobes and right temporal white matter. 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  Clinical Impression  Pt admitted with above diagnosis. At baseline, pt active, independent, and working.  Pt lives with his wife but she is disabled and has 24 hr care.  Today, he was able to ambulate 300' with a cane and CGA.  He reports more unsteady than baseline and scored 15/24 on the DGI - indicating decreased balance.  No focal strength deficits noted and cramping in L thigh improved since OT.  Pt expected to progress well. Recommend outpt PT at d/c.   Pt currently with functional limitations due to the deficits listed below (see PT Problem List). Pt will benefit from acute skilled PT to increase their independence and safety with mobility to allow discharge.           If plan is discharge home, recommend the following: A little help with walking and/or transfers;A little help with bathing/dressing/bathroom;Assistance with cooking/housework;Help with stairs or ramp for entrance   Can travel by private vehicle        Equipment Recommendations None recommended by PT  Recommendations for Other Services       Functional Status Assessment Patient has had a recent decline in their functional status and demonstrates the ability to make significant improvements in function in a reasonable and predictable amount of time.     Precautions / Restrictions Precautions Precautions:  Fall Restrictions Weight Bearing Restrictions: No      Mobility  Bed Mobility Overal bed mobility: Needs Assistance Bed Mobility: Supine to Sit, Sit to Supine     Supine to sit: Supervision Sit to supine: Supervision        Transfers Overall transfer level: Needs assistance Equipment used: Straight cane Transfers: Sit to/from Stand Sit to Stand: Contact guard assist           General transfer comment: Performed x 3 with cane and bed rail    Ambulation/Gait Ambulation/Gait assistance: Contact guard assist Gait Distance (Feet): 300 Feet Assistive device: Straight cane Gait Pattern/deviations: Step-through pattern Gait velocity: decreased     General Gait Details: Pt overall steady but did have 3-4 LOB in which he recovered on his own (did improve with shoes).  Pt's LOB occurred with narrow BOS/near scissor.  Educated on wider BOS. Pt requiring use of cane and reports unsteady compared to baseline and worse than yesterday  Stairs Stairs: Yes Stairs assistance: Contact guard assist Stair Management: Two rails, Step to pattern, Forwards Number of Stairs: 2 General stair comments: CGA and rails required  Wheelchair Mobility     Tilt Bed    Modified Rankin (Stroke Patients Only) Modified Rankin (Stroke Patients Only) Pre-Morbid Rankin Score: No symptoms Modified Rankin: Slight disability     Balance Overall balance assessment: Needs assistance Sitting-balance support: Feet supported Sitting balance-Leahy Scale: Good     Standing balance support: No upper extremity supported, During functional activity Standing balance-Leahy Scale: Fair Standing balance comment: Cane to ambulate but could static stand  Standardized Balance Assessment Standardized Balance Assessment : Dynamic Gait Index   Dynamic Gait Index Level Surface: Mild Impairment Change in Gait Speed: Mild Impairment Gait with Horizontal Head Turns: Mild Impairment Gait  with Vertical Head Turns: Mild Impairment Gait and Pivot Turn: Mild Impairment Step Over Obstacle: Mild Impairment Step Around Obstacles: Mild Impairment Steps: Moderate Impairment Total Score: 15       Pertinent Vitals/Pain Pain Assessment Pain Assessment: No/denies pain    Home Living Family/patient expects to be discharged to:: Private residence Living Arrangements: Spouse/significant other Available Help at Discharge: Family;Available PRN/intermittently;Personal care attendant;Available 24 hours/day (reports has 24 hr care for his wife) Type of Home: House Home Access: Stairs to enter Entrance Stairs-Rails: Right Entrance Stairs-Number of Steps: 2 (very small)   Home Layout: Able to live on main level with bedroom/bathroom;Multi-level Home Equipment: BSC/3in1;Shower seat - built in;Rolling Walker (2 wheels);Transport chair;Cane - single point      Prior Function Prior Level of Function : Independent/Modified Independent;Driving;Working/employed             Mobility Comments: no AD, indep, active (walked 2 miles on Sunday) 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, indep ADL/IADLs     Extremity/Trunk Assessment   Upper Extremity Assessment Upper Extremity Assessment: Overall WFL for tasks assessed    Lower Extremity Assessment Lower Extremity Assessment: LLE deficits/detail;RLE deficits/detail RLE Deficits / Details: ROM WFL; MMT 5/5 throughout RLE Sensation: WNL RLE Coordination: WNL LLE Deficits / Details: ROM WFL; MMT 5/5 throughout (did gentle testing on R knee and hip due to pt having muscle spasms/cramps earlier with OT) LLE Sensation: WNL LLE Coordination: WNL    Cervical / Trunk Assessment Cervical / Trunk Assessment: Normal  Communication   Communication Communication: Hearing impairment (reads lips well)  Cognition Arousal: Alert Behavior During Therapy: WFL for tasks assessed/performed Overall Cognitive Status: Within Functional Limits for tasks  assessed                                          General Comments General comments (skin integrity, edema, etc.): Reports L thigh cramping improved.  VSS.  Educated on PT role and recommendation for outpt PT (pt agrees)    Exercises     Assessment/Plan    PT Assessment Patient needs continued PT services  PT Problem List Decreased strength;Decreased activity tolerance;Decreased balance;Decreased mobility       PT Treatment Interventions DME instruction;Therapeutic exercise;Gait training;Patient/family education;Therapeutic activities;Functional mobility training;Stair training;Neuromuscular re-education;Balance training    PT Goals (Current goals can be found in the Care Plan section)  Acute Rehab PT Goals Patient Stated Goal: return home PT Goal Formulation: With patient Time For Goal Achievement: 04/21/23 Potential to Achieve Goals: Good Additional Goals Additional Goal #1: Pt will increased DGI score to >19/24 for lower fall risk    Frequency Min 1X/week     Co-evaluation               AM-PAC PT "6 Clicks" Mobility  Outcome Measure Help needed turning from your back to your side while in a flat bed without using bedrails?: None Help needed moving from lying on your back to sitting on the side of a flat bed without using bedrails?: A Little Help needed moving to and from a bed to a chair (including a wheelchair)?: A Little Help needed standing up from a chair using your arms (e.g., wheelchair or bedside chair)?:  A Little Help needed to walk in hospital room?: A Little Help needed climbing 3-5 steps with a railing? : A Little 6 Click Score: 19    End of Session Equipment Utilized During Treatment: Gait belt Activity Tolerance: Patient tolerated treatment well Patient left: with chair alarm set;in chair;with call bell/phone within reach Nurse Communication: Mobility status PT Visit Diagnosis: Other abnormalities of gait and mobility  (R26.89);Muscle weakness (generalized) (M62.81)    Time: 7564-3329 PT Time Calculation (min) (ACUTE ONLY): 19 min   Charges:   PT Evaluation $PT Eval Low Complexity: 1 Low   PT General Charges $$ ACUTE PT VISIT: 1 Visit         Anise Salvo, PT Acute Rehab [facility] [phone]   Rayetta Humphrey 04/07/2023, 2:57 PM

## 2023-04-07 NOTE — Progress Notes (Addendum)
STROKE TEAM PROGRESS NOTE   BRIEF HPI [name] is a 72 y.o. male with history of significant for bilateral ACA strokes (left greater than right, May 2024 with minimal residual weakness), diabetes, hypertension, hyperlipidemia, remote diagnosis of sleep apnea not interested in CPAP, prostate cancer   He reports he made a good recovery from his stroke in May.  3 weeks ago he had a tooth extraction done for pain.  No concern for infection, no fevers etc.  2 weeks ago he began to notice worsening dizziness/unsteadiness.  On 11/25 morning he felt much worse and therefore came to the ED for evaluation where MRI revealed multifocal small strokes for which neurology was consulted.   He notes he has been taking aspirin and Plavix at home, aspirin for pain control due to his recent tooth extraction for the past week, but as he was wanted to avoid opiate medications.   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 Was able to exercise in the gym Sunday but noticed worsening symptoms Sunday with bilateral leg weakness. Originally thought his weakness, was due to overexertion/stress  with helping managing his wife and her health conditions.  Feelings similar to prior stroke. Had noticed some unbalance gait last 1-2 weeks.   Loop recorder interrogated, negative for Afib  Start Brilinta and ASA for 30 days and then only Plavix ECHO EF 60-65%, mildly dilated RV and hypokinetic  PT eval pending  OT/SLP no need for follow-up services     OBJECTIVE  CBC    Component Value Date/Time   WBC 8.6 04/07/2023 0458   RBC 4.03 (L) 04/07/2023 0458   HGB 12.1 (L)  04/07/2023 0458   HCT 36.0 (L) 04/07/2023 0458   PLT 171 04/07/2023 0458   MCV 89.3 04/07/2023 0458   MCH 30.0 04/07/2023 0458   MCHC 33.6 04/07/2023 0458   RDW 12.4 04/07/2023 0458   LYMPHSABS 1.6 04/06/2023 1141   MONOABS 1.0 04/06/2023 1141   EOSABS 0.1 04/06/2023 1141   BASOSABS 0.0 04/06/2023 1141    BMET    Component Value Date/Time   NA 138 04/07/2023 0458   NA 142 04/06/2017 1057   K 3.7 04/07/2023 0458   CL 101 04/07/2023 0458   CO2 28 04/07/2023 0458   GLUCOSE 108 (H) 04/07/2023 0458   BUN 12 04/07/2023 0458   BUN 13 04/06/2017 1057   CREATININE 1.08 04/07/2023 0458   CALCIUM 9.3 04/07/2023 0458   GFRNONAA >60 04/07/2023 0458    IMAGING past 24 hours ECHOCARDIOGRAM COMPLETE  Result Date: 04/07/2023    ECHOCARDIOGRAM REPORT   Patient Name:   Kenneth [name] Date of Exam: 04/07/2023 Medical Rec #:  [id number]        Height:       71.0 in Accession #:    [id number]       Weight:       21 7.0 lb Date of Birth:  1951/01/05        BSA:          2.183 m Patient Age:    72 years         BP:  128/74 mmHg Patient Gender: M                HR:           67 bpm. Exam Location:  Inpatient Procedure: 2D Echo and Limited Color Doppler Indications:    I63.9 Strike  History:        Patient has prior history of Echocardiogram examinations, most                 recent 10/09/2022. CHF, COPD and Stroke; Risk                 Factors:Hypertension, Diabetes and Sleep Apnea.  Sonographer:    Dondra Prader RVT RCS Referring Phys: [facility] GOEL  Sonographer Comments: Technically challenging study due to limited acoustic windows and Technically difficult study due to poor echo windows. Image acquisition challenging due to uncooperative patient. Patient refused to finish with ECHO. He said it is uncomfortable. He would not lie still which made the test difficult to perform IMPRESSIONS  1. Left ventricular ejection fraction, by estimation, is 60 to 65%. The left ventricle has normal function. Left  ventricular endocardial border not optimally defined to evaluate regional wall motion. Left ventricular diastolic function could not be evaluated.  2. Right ventricular systolic function is mildly reduced. The right ventricular size is mildly enlarged.  3. The mitral valve is normal in structure. Trivial mitral valve regurgitation. No evidence of mitral stenosis.  4. The aortic valve is calcified. Aortic valve regurgitation is not visualized. Aortic valve sclerosis/calcification is present, without any evidence of aortic stenosis. Conclusion(s)/Recommendation(s): Very limited echo with poor images. LV is grossly normal. RV appears mildly dilated and hypokinetic but is imaged off axis and is not well seen. FINDINGS  Left Ventricle: Left ventricular ejection fraction, by estimation, is 60 to 65%. The left ventricle has normal function. Left ventricular endocardial border not optimally defined to evaluate regional wall motion. The left ventricular internal cavity size was normal in size. There is no left ventricular hypertrophy. Left ventricular diastolic function could not be evaluated. Right Ventricle: The right ventricular size is mildly enlarged. No increase in right ventricular wall thickness. Right ventricular systolic function is mildly reduced. Left Atrium: Left atrial size was normal in size. Right Atrium: Right atrial size was normal in size. Pericardium: There is no evidence of pericardial effusion. Mitral Valve: The mitral valve is normal in structure. Trivial mitral valve regurgitation. No evidence of mitral valve stenosis. Tricuspid Valve: The tricuspid valve is grossly normal. Tricuspid valve regurgitation is trivial. No evidence of tricuspid stenosis. Aortic Valve: The aortic valve is calcified. Aortic valve regurgitation is not visualized. Aortic valve sclerosis/calcification is present, without any evidence of aortic stenosis. Pulmonic Valve: The pulmonic valve was normal in structure. Pulmonic valve  regurgitation is not visualized. No evidence of pulmonic stenosis. Aorta: The aortic arch was not well visualized and the ascending aorta was not well visualized. IAS/Shunts: No atrial level shunt detected by color flow Doppler.  LEFT VENTRICLE PLAX 2D LVIDd:         4.60 cm LVIDs:         3.20 cm LV PW:         1.00 cm LV IVS:        0.80 cm LVOT diam:     2.75 cm LVOT Area:     5.94 cm  LEFT ATRIUM         Index LA diam:    3.80 cm 1.74 cm/m  SHUNTS Systemic Diam: 2.75 cm Arvilla Meres MD Electronically signed by Arvilla Meres MD Signature Date/Time: 04/07/2023/11:05:05 AM    Final    CT ANGIO HEAD NECK W WO CM  Result Date: 04/06/2023 CLINICAL DATA:  Stroke/TIA, determine embolic source. EXAM: CT ANGIOGRAPHY HEAD AND NECK WITH AND WITHOUT CONTRAST TECHNIQUE: Multidetector CT imaging of the head and neck was performed using the standard protocol during bolus administration of intravenous contrast. Multiplanar CT image reconstructions and MIPs were obtained to evaluate the vascular anatomy. Carotid stenosis measurements (when applicable) are obtained utilizing NASCET criteria, using the distal internal carotid diameter as the denominator. RADIATION DOSE REDUCTION: This exam was performed according to the departmental dose-optimization program which includes automated exposure control, adjustment of the mA and/or kV according to patient size and/or use of iterative reconstruction technique. CONTRAST:  75mL OMNIPAQUE IOHEXOL 350 MG/ML SOLN COMPARISON:  CTA head/neck Oct 08, 2022. FINDINGS: CT HEAD FINDINGS Brain: No evidence of acute large vascular territory infarction, hemorrhage, hydrocephalus, extra-axial collection or mass lesion/mass effect. Vascular: See below. Skull: No acute fracture. Sinuses/Orbits: Clear sinuses.  No acute orbital findings. Review of the MIP images confirms the above findings CTA NECK FINDINGS Aortic arch: Great vessel origins are patent without significant stenosis. Right  carotid system: No evidence of dissection, stenosis (50% or greater), or occlusion. Left carotid system: No evidence of dissection, stenosis (50% or greater), or occlusion. Vertebral arteries: Mild narrowing of the right vertebral artery proximally. No evidence of dissection, stenosis (50% or greater), or occlusion. Skeleton: No acute abnormality on limited assessment. Other neck: No acute abnormality on limited assessment. Upper chest: Visualized lung apices are clear. Review of the MIP images confirms the above findings CTA HEAD FINDINGS Anterior circulation: Bilateral intracranial ICAs, MCAs, and ACAs are patent without proximal hemodynamically significant stenosis. Similar diminutive distal right ACA. Posterior circulation: Bilateral intradural vertebral arteries, basilar artery and bilateral posterior cerebral arteries are patent without proximal hemodynamically significant stenosis. Venous sinuses: As permitted by contrast timing, patent. Review of the MIP images confirms the above findings IMPRESSION: No emergent large vessel occlusion or proximal high-grade stenosis. Similar diminutive distal right ACA. Electronically Signed   By: Feliberto Harts M.D.   On: 04/06/2023 17:01   MR Brain Wo Contrast (neuro protocol)  Result Date: 04/06/2023 CLINICAL DATA:  Headache, neuro deficit. Dizziness. Unsteady gait. History of stroke. EXAM: MRI HEAD WITHOUT CONTRAST TECHNIQUE: Multiplanar, multiecho pulse sequences of the brain and surrounding structures were obtained without intravenous contrast. COMPARISON:  Head MRI 10/08/2022 FINDINGS: Image quality is degraded by motion artifact and reduced signal to noise primarily anteriorly. Brain: There are small acute to subacute cortical and subcortical infarcts in the left frontal and parietal lobes, and there is also a small acute to early subacute infarct involving the periventricular white matter in the posterior right temporal lobe. There is mild encephalomalacia  medially in the left frontoparietal region corresponding to an area of acute infarction on the prior MRI. T2 hyperintensities elsewhere in the cerebral white matter are similar to the prior study and nonspecific but compatible with mild chronic small vessel ischemic disease. A small chronic infarct is again seen involving the right frontal periventricular white matter, and there may be 1 or 2 tiny chronic right cerebellar infarcts. There is mild cerebral atrophy. No intracranial hemorrhage, mass, midline shift, or extra-axial fluid collection is identified. Vascular: Major intracranial vascular flow voids are preserved. Skull and upper cervical spine: Unremarkable bone marrow signal. Sinuses/Orbits: Unremarkable orbits. Mild mucosal thickening in the  ethmoid sinuses. Clear mastoid air cells. Other: None. IMPRESSION: 1. Small acute to subacute infarcts in the left frontal and parietal lobes and right temporal white matter. 2. Mild chronic small vessel ischemic disease. Electronically Signed   By: Sebastian Ache M.D.   On: 04/06/2023 16:14    Vitals:   04/06/23 2200 04/07/23 0020 04/07/23 0400 04/07/23 0756  BP: 125/64 127/63 (!) 142/64 119/63  Pulse: (!) 51 (!) 58 (!) 54 (!) 57  Resp: 18 16  18   Temp: 98 F (36.7 C) 98 F (36.7 C) 98 F (36.7 C) (!) 97.3 F (36.3 C)  TempSrc: Oral Oral Oral Oral  SpO2: 95% 94% 95% 95%  Weight:      Height:         PHYSICAL EXAM General:  Alert, well-nourished,elderly, caucasian male, well-developed patient in no acute distress, secretary bedside  Psych:  Mood and affect appropriate for situation CV: Regular rate and rhythm on monitor Respiratory:  Regular, unlabored respirations on room air  NEURO:  Mental Status: AA&Ox3, patient is able to give clear and coherent history Speech/Language: speech is without aphasia.  Cranial Nerves:  II: PERRL. Visual fields full.  III, IV, VI: EOMI. Eyelids elevate symmetrically.  V: Sensation is intact to light touch  and symmetrical to face.  VII: Mild right sided facial droop  VIII: hearing intact to voice. IX, X: Palate elevates symmetrically. Phonation is normal.  WG:[id number] shrug 5/5. XII: tongue is midline without fasciculations. Motor: 5/5 strength to all muscle groups tested.  Tone: is normal and bulk is normal Sensation- Intact to light touch bilaterally. Extinction absent to light touch to DSS.   Coordination: FTN intact bilaterally, No drift.  Gait- deferred  Most Recent NIH 1   ASSESSMENT/PLAN  Multifocal Strokes  Small acute/subacute infarcts in the left frontal and parietal lobes, right temporal white matter Etiology:  Suspect chronic small vessel disease  MRI  Small acute to subacute infarcts in the left frontal and parietal lobes and right temporal white matter.  CTA head & neck Similar diminutive distal right ACA 2D Echo 60-65% EF, echo w/ poor imaging, RV mildly dilated and hypokinetic  Loop recorder interrogated, no afib  LDL 47 HgbA1c 8.2 VTE prophylaxis - Lovenox  UDS negative  aspirin 81 mg daily and clopidogrel 75 mg daily prior to admission, now on aspirin 81 mg daily and Brilinta (ticagrelor) 90 mg bid for 30 days and then Plavix 75 mg daily alone. Therapy recommendations: outpt PT Disposition:  Pending   Hx of Stroke/TIA 09/2022 admitted for bilateral ACA infarct, left more than right.  CTA head and neck showed right A3 stenosis.  EF 55 to 60%, no DVT.  LDL 45, A1c 5.6.  Discharged on DAPT and Lipitor 40 and Zetia.  Loop recorder placed.  Hypertension Home meds:  Chlorthalidone, benazepril  BP goal normotensive  BP stable currently, can gradually restart medications since outside of permissive window   Hyperlipidemia Home meds:  Lipitor, Zetia resumed in hospital LDL 47, goal < 70 Continue home medications  Continue statin at discharge  Diabetes type II Uncontrolled Home meds:  Metformin, Toujeo, mounjaro HgbA1c 8.2, goal < 7.0 CBGs Semglee 30 units daily  per primary team  SSI Recommend close follow-up with PCP for better DM control  Other Stroke Risk Factors Obesity, Body mass index is 30.27 kg/m., BMI >/= 30 associated with increased stroke risk, recommend weight loss, diet and exercise as appropriate  Obstructive sleep apnea, non-compliant with CPAP at home  Other Active Problems Vitamin D Deficiency: continue home meds  Prostate cancer  Hospital day # 1  Neurology will sign off. Please call with questions. Pt will follow up with stroke clinic NP at [facility] in about 4 weeks. Thanks for the consult.  Marvel Plan, MD PhD Stroke Neurology 04/07/2023 3:37 PM  To contact Stroke Continuity provider, please refer to [email or web address]. After hours, contact General Neurology

## 2023-04-07 NOTE — TOC Transition Note (Addendum)
Transition of Care [facility]) - CM/SW Discharge Note   Patient Details  Name: Kenneth [name] MRN: [id number] Date of Birth: [birth year]  [facility]) CM/SW Contact:  Kermit Balo, RN Phone Number: 04/07/2023, 3:35 PM   Clinical Narrative:     Patient is from home with his spouse and 24/7 caregiver.  Pt states he has needed DME at home. Cane at the bedside. Pt states no current issues with transportation but may in the future. He still asked for outpt therapy over home health. Pt prefers to attend at [facility] outpt therapy. Referral sent. Pt will call to schedule the first appointment. Information on the AVS. Pt manages his own medications with a pill box and denies any issues.  Pt says he will work on transportation at d/c.   Final next level of care: OP Rehab Barriers to Discharge: No Barriers Identified   Patient Goals and CMS Choice   Choice offered to / list presented to : Patient  Discharge Placement                         Discharge Plan and Services Additional resources added to the After Visit Summary for                                       Social Determinants of Health (SDOH) Interventions SDOH Screenings   Food Insecurity: No Food Insecurity (04/06/2023)  Housing: Low Risk  (04/06/2023)  Transportation Needs: No Transportation Needs (04/06/2023)  Utilities: Not At Risk (04/06/2023)  Depression (PHQ2-9): Low Risk  (11/07/2022)  Tobacco Use: Medium Risk (04/06/2023)     Readmission Risk Interventions     No data to display

## 2023-04-07 NOTE — Progress Notes (Addendum)
PROGRESS NOTE  [name]  DOB: 11-30-1950  PCP: [name], MD JXB:[id number]  DOA: 04/06/2023  LOS: 1 day  Hospital Day: 2  Brief narrative: Kenneth [name] is a 72 y.o. male with PMH significant for DM2, HTN, HLD, OSA, b/l ACA strokes, prostate cancer, cervical spinal stenosis. 11/25, patient was brought to the ED with complaint of progressively worsening dizziness and unsteady gait.  In May 2024, patient had bilateral ACA strokes L>R from which he made a good recovery and ended up with only minimal residual weakness.  He had a loop recorder inserted at that time.  Reports compliance to Plavix since then. 2 weeks prior, he began to notice worsening dizziness, unsteadiness. Symptoms progressed and on the day of presentation, he felt much worse.  He got concerned with the possibility of another stroke and hence came to the ED. Patient reports being under a lot of family stress recently.  In the ED, he was afebrile, hemoglobin stable Labs mostly unremarkable except for potassium low at 3.4 Urinalysis unremarkable, blood alcohol level not elevated, urine drug screen negative MRI brain showed small acute to subacute infarct in the left frontal and parietal lobes and right temporal white matter and mild chronic small vessel ischemic disease. CT angio head and neck did not show any emergent large vessel occlusion or proximal high-grade stenosis. Seen by neurology Admitted to [facility] for stroke workup  Subjective: Patient was seen and examined this morning.  Pleasant elderly Caucasian male.  Lying in bed.  Not in distress. Neurology was at bedside at the time of evaluation. Chart reviewed Remains hemodynamically stable Most recent labs from this morning with HDL low at 17, LDL 47, A1c 8.2  Assessment and plan: Multifocal strokes H/o b/l ACA strokes - 09/2022 Presented with progressively worsening dizziness, unsteadiness. MRI brain showed acute bilateral anterior and posterior  circulation cerebral infarcts CT angio head without LVO or high-grade stenosis Neurology consulted Stroke workup initiated HDL low at 17, LDL 47, A1c 8.2 Loop recorder interrogation did not show any evidence of A-fib. Echocardiogram showed EF of 60 to 65%, mildly enlarged RV size and reduced RV function.  No evidence of interatrial shunt PT/OT eval pending. Patient has a loop recorder in place loop recorder interrogation requested to cardiology service.  If negative, may need to consider hypercoagulable workup. PTA meds- Plavix 75 mg daily. Neurology recommends DAPT with aspirin and Brilinta for a month followed by resumption of Plavix alone.  Type 2 diabetes mellitus uncontrolled A1c 8.1 04/07/2023 PTA meds-Mounjaro weekly on Mondays, Toujeo 36 units daily, metformin 500 mg twice daily, Currently ordered for Semglee 30 units daily and SSI/Accu-Cheks Continue to monitor Recent Labs  Lab 04/06/23 2301 04/07/23 0642 04/07/23 1205  GLUCAP 149* 107* 125*   Hypertension PTA meds- benazepril 20 mg twice daily, chlorthalidone 25 mg daily Currently blood pressure is normal without meds.  Continue to monitor  Hyperlipidemia Continue Lipitor 40 mg daily, Zetia 10 mg daily  OSA Not interested in CPAP  Cervical spinal stenosis  prostate cancer    Goals of care   Code Status: Full Code     DVT prophylaxis:  enoxaparin (LOVENOX) injection 40 mg Start: 04/06/23 2200 SCD's Start: 04/06/23 1730   Antimicrobials: None Fluid: None Consultants: Neurology Family Communication: Family at bedside  Status: Inpatient Level of care:  Telemetry Medical   Patient is from: Home Needs to continue in-hospital care: Seen by PT.  Outpatient PT recommended.  I checked with patient this afternoon.  He states he feels weak and had muscle cramps.  He is requesting to wait till tomorrow for discharge    Diet:  Diet Order             Diet Carb Modified Fluid consistency: Thin; Room service  appropriate? Yes  Diet effective now                   Scheduled Meds:  [START ON 04/08/2023] aspirin  81 mg Oral Daily   atorvastatin  40 mg Oral QHS   cholecalciferol  1,000 Units Oral Daily   enoxaparin (LOVENOX) injection  40 mg Subcutaneous Q24H   ezetimibe  10 mg Oral Daily   insulin aspart  0-15 Units Subcutaneous TID WC   insulin aspart  0-5 Units Subcutaneous QHS   insulin glargine-yfgn  30 Units Subcutaneous Daily   [START ON 04/08/2023] ticagrelor  90 mg Oral BID    PRN meds: acetaminophen **OR** acetaminophen (TYLENOL) oral liquid 160 mg/5 mL **OR** acetaminophen, mouth rinse, senna-docusate   Infusions:    Antimicrobials: Anti-infectives (From admission, onward)    None       Objective: Vitals:   04/07/23 0756 04/07/23 1204  BP: 119/63 131/66  Pulse: (!) 57 (!) 58  Resp: 18 18  Temp: (!) 97.3 F (36.3 C) 98 F (36.7 C)  SpO2: 95% 97%    Intake/Output Summary (Last 24 hours) at 04/07/2023 1527 Last data filed at 04/07/2023 1356 Gross per 24 hour  Intake 720 ml  Output 1200 ml  Net -480 ml   Filed Weights   04/06/23 1119  Weight: 98.4 kg   Weight change:  Body mass index is 30.27 kg/m.   Physical Exam: General exam: Pleasant, elderly Caucasian male.  Not in distress Skin: No rashes, lesions or ulcers. HEENT: Atraumatic, normocephalic, no obvious bleeding Lungs: Clear to auscultation bilaterally CVS: Regular rate and rhythm, no murmur GI/Abd soft, nontender, nondistended, bowel sound CNS: Alert, awake, oriented x 3. Psychiatry: Mood appropriate Extremities: No pedal edema, no calf tenderness  Data Review: I have personally reviewed the laboratory data and studies available.  F/u labs  Unresulted Labs (From admission, onward)     Start     Ordered   04/13/23 0500  Creatinine, serum  (enoxaparin (LOVENOX)    CrCl >/= 30 ml/min)  Weekly,   R     Comments: while on enoxaparin therapy    04/06/23 1732   04/07/23 0500  CBC  Daily,    R      04/06/23 1732   04/06/23 1730  Creatinine, serum  (enoxaparin (LOVENOX)    CrCl >/= 30 ml/min)  Once,   R       Comments: Baseline for enoxaparin therapy IF NOT ALREADY DRAWN.    04/06/23 1732   04/06/23 1720  CBC  Once,   R        04/06/23 1720   04/06/23 1720  Differential  Once,   R        04/06/23 1720           Total time spent in review of labs and imaging, patient evaluation, formulation of plan, documentation and communication with family: 45 minutes  Signed, Lorin Glass, MD [facility] 04/07/2023

## 2023-04-07 NOTE — Progress Notes (Signed)
SLP Cancellation Note  Patient Details Name: Kenneth [name] MRN: [id number] DOB: January 12, 1951   Cancelled treatment:       Reason Eval/Treat Not Completed: Patient at procedure or test/unavailable- will continue efforts.   Blenda Mounts Laurice 04/07/2023, 9:27 AM

## 2023-04-07 NOTE — Evaluation (Signed)
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   Syncope 07/23/2017   Leukocytosis 07/23/2017   Hypokalemia 07/23/2017   UTI (urinary tract infection) 07/23/2017   Controlled type 2 diabetes mellitus without complication, without long-term current use of insulin (HCC) 02/10/2017   Mixed hyperlipidemia 02/10/2017   Hearing loss 02/10/2017   OSA (obstructive sleep apnea) 02/10/2017   History of colon polyps - adenomas 04/03/2010   Past Medical History:  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   History of colon polyps - adenomas 04/03/2010   Hyperlipidemia    Hypertension    Iron deficiency anemia, unspecified    OSA (obstructive sleep apnea)    refusing CPAP   Pneumonia 03/2022   w/effusion   Prostate cancer (HCC)    Stroke [facility])    Past Surgical History:  Past Surgical History:  Procedure Laterality Date   ANTERIOR CERVICAL DECOMP/DISCECTOMY FUSION N/A 10/01/2012   Procedure: ANTERIOR CERVICAL DECOMPRESSION/DISCECTOMY FUSION 2 LEVELS;  Surgeon: Tia Alert, MD;  Location: [facility];  Service: Neurosurgery;  Laterality: N/A;  Cervical five-six,Cervical six-seven   COLONOSCOPY  09/30/2010, 03/01/2014   diverticulosis, small internal  hemorrhoids   COLONOSCOPY W/ POLYPECTOMY  04/03/2010   4 polyps 8-46mm, worst TV adenoma with high-grade dysplasia   LOOP RECORDER INSERTION N/A 10/10/2022   Procedure: LOOP RECORDER INSERTION;  Surgeon: Marinus Maw, MD;  Location: [facility];  Service: Cardiovascular;  Laterality: N/A;   NO PAST SURGERIES     POLYPECTOMY     POSTERIOR CERVICAL FUSION/FORAMINOTOMY Left 10/20/2012   Procedure: C/5-6,C/6-7 Lami/Multi level,POSTERIOR CERVICAL FUSION/FORAMINOTOMY C/4-7;  Surgeon: Tia Alert, MD;  Location: [facility];  Service: Neurosurgery;  Laterality: Left;  Cervical Five-Six, Cervical Six-Seven Laminectomies, Posterior Cervical Fusion/Foraminotomies Cervical Four through Seven.    TONSILLECTOMY     HPI:  ARSHAD LAWRENZ is a 72 y.o. man who presented to ED after two-week hx of dizziness/unsteadiness.  MRI: Small acute to subacute infarcts in the left frontal and parietal  lobes and right temporal white matter. 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 Independent, driving PTA.   Assessment / Plan / Recommendation Clinical Impression  Mr. Katzman presents with normal expressive and receptive language. Speech is fluent; he has mild articulatory differences that are c/w his congenital hearing loss (corrected with bilateral hearing aids.) He demonstrates normal attention, working memory, awareness, and executive functioning. He has appropriate concerns about his wife, who just returned home 11/22 after nine months of hospitalizations/long-term care and now has 24/7 care at home.  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  There are no speech needs identified. Our service will sign off.    SLP Assessment  SLP  Recommendation/Assessment: Patient does not need any further Speech Lanaguage Pathology Services SLP Visit Diagnosis: Cognitive communication deficit (R41.841)    Recommendations for follow up therapy are one component of a multi-disciplinary discharge planning process, led by the attending physician.  Recommendations may be updated based on patient status, additional functional criteria and insurance authorization.    Follow Up Recommendations  No SLP follow up                       SLP Evaluation Cognition  Overall Cognitive Status: Within Functional Limits for tasks assessed Attention: Alternating Alternating Attention: Appears intact Awareness: Appears intact       Comprehension  Auditory Comprehension Overall Auditory Comprehension: Appears within functional limits for tasks assessed Commands: Within Functional Limits Visual Recognition/Discrimination Discrimination: Within Function Limits Reading Comprehension Reading Status: Within funtional limits    Expression Expression Primary Mode of Expression: Verbal Verbal Expression Overall Verbal Expression: Appears within functional limits for tasks assessed Written Expression Dominant Hand: Right Written Expression: Within Functional Limits   Oral / Motor  Oral Motor/Sensory Function Overall Oral Motor/Sensory Function: Within functional limits Motor Speech Overall Motor Speech: Appears within functional limits for tasks assessed            Blenda Mounts Laurice 04/07/2023, 10:15 AM Marchelle Folks L. Samson Frederic, MA CCC/SLP Clinical Specialist - Acute Care SLP Acute Rehabilitation Services Office number [phone]

## 2023-04-08 ENCOUNTER — Other Ambulatory Visit (HOSPITAL_COMMUNITY): Payer: Self-pay

## 2023-04-08 DIAGNOSIS — I63322 Cerebral infarction due to thrombosis of left anterior cerebral artery: Secondary | ICD-10-CM | POA: Diagnosis not present

## 2023-04-08 DIAGNOSIS — Z794 Long term (current) use of insulin: Secondary | ICD-10-CM | POA: Diagnosis not present

## 2023-04-08 DIAGNOSIS — E1169 Type 2 diabetes mellitus with other specified complication: Secondary | ICD-10-CM

## 2023-04-08 LAB — CBC
HCT: 36.2 % — ABNORMAL LOW (ref 39.0–52.0)
Hemoglobin: 12.2 g/dL — ABNORMAL LOW (ref 13.0–17.0)
MCH: 29.5 pg (ref 26.0–34.0)
MCHC: 33.7 g/dL (ref 30.0–36.0)
MCV: 87.7 fL (ref 80.0–100.0)
Platelets: 168 10*3/uL (ref 150–400)
RBC: 4.13 MIL/uL — ABNORMAL LOW (ref 4.22–5.81)
RDW: 12.4 % (ref 11.5–15.5)
WBC: 9.4 10*3/uL (ref 4.0–10.5)
nRBC: 0 % (ref 0.0–0.2)

## 2023-04-08 LAB — GLUCOSE, CAPILLARY: Glucose-Capillary: 103 mg/dL — ABNORMAL HIGH (ref 70–99)

## 2023-04-08 MED ORDER — CLOPIDOGREL BISULFATE 75 MG PO TABS: 75.0000 mg | ORAL_TABLET | Freq: Every day | ORAL | Status: DC

## 2023-04-08 MED ORDER — ASPIRIN 81 MG PO CHEW
81.0000 mg | CHEWABLE_TABLET | Freq: Every day | ORAL | 1 refills | Status: DC
  Filled 2023-04-08: qty 30, 30d supply, fill #0

## 2023-04-08 MED ORDER — TOUJEO SOLOSTAR 300 UNIT/ML ~~LOC~~ SOPN: 40.0000 [IU] | PEN_INJECTOR | Freq: Every morning | SUBCUTANEOUS | Status: DC

## 2023-04-08 MED ORDER — TICAGRELOR 90 MG PO TABS
90.0000 mg | ORAL_TABLET | Freq: Two times a day (BID) | ORAL | 0 refills | Status: AC
  Filled 2023-04-08: qty 60, 30d supply, fill #0

## 2023-04-08 NOTE — Discharge Summary (Signed)
Physician Discharge Summary  Kenneth [name] WUJ:[id number] DOB: July 20, 1950 DOA: 04/06/2023  PCP: [name], MD  Admit date: 04/06/2023 Discharge date: 04/08/2023  Admitted From: Home Disposition:  Home  Recommendations for Outpatient Follow-up:  Follow up with PCP in 1-2 weeks Please obtain BMP/CBC in one week Please follow up on the following pending results:  Home Health:Yes Equipment/Devices:None  Discharge Condition:Stable CODE STATUS:Full Diet recommendation: Heart Healthy  Brief/Interim Summary: 72 y.o. 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  MRI of the brain showed small acute infarcts of the left frontal and parietal lobes and right temporal white matter.  CTA of the head showed no LVO   Discharge Diagnoses:  Principal Problem:   Stroke (cerebrum) [facility]) Active Problems:   Essential hypertension   Diabetes (HCC)  Multiple CVA/history of bilateral ICA in May 2024: Neurology was consulted, HDL 17 LDL is 47 A1c of 8.2. Loop recorder did not show atrial fibrillation. 2D echo showed an EF of 60% PT OT eval is pending. Patient has a loop recorder in place, per neurology may need to consider hypercoagulable workup as an outpatient, if interrogation is negative. Prior to admission he was on Plavix, neurology recommended aspirin and Brilinta for about a month then resumption Plavix alone. Physical therapy evaluated the patient recommended home health PT.   Uncontrolled diabetes mellitus type 2: With a last A1c of 8.1 in November 24. Patient is on Mounjaro Toujeo and metformin. Due to [facility] and insulin use Toujeo was slightly increased.  Goal  A1c less than 6.5.   Essential hypertension: Blood pressure is controlled without any antihypertensive medication continue to monitor.   Hyperlipidemia Continue statin and Setia.   Struct of sleep apnea: Not interested in CPAP.   Cervical stenosis: Noted.   Prostate cancer: Follow-up with oncology as an outpatient.    Discharge Instructions  Discharge Instructions     Ambulatory referral to Neurology   Complete by: As directed    Follow up with stroke clinic NP  at [facility] in about 4-6 weeks. Thanks.   Ambulatory referral to Physical Therapy   Complete by: As directed    Diet - low sodium heart healthy   Complete by: As directed    Increase activity slowly   Complete by: As directed       Allergies as of 04/08/2023       Reactions   Jardiance [empagliflozin] Other (See Comments)   To the hospital- due to complications   Fluoxetine Other (See Comments)   Prefers to NOT take this   Penicillins Other (See Comments)   CHILDHOOD ALLERGY- exact reaction? Has patient had a PCN reaction causing immediate rash, facial/tongue/throat swelling, SOB or lightheadedness with hypotension: Unknown Has patient had a PCN reaction causing severe rash involving mucus membranes or skin necrosis: Unknown Has patient had a PCN reaction that required hospitalization: Unknown Has patient had a PCN reaction occurring within the last 10 years: Unknown If all of the above answers are "NO", then may proceed with Cephalosporin use.   Silodosin Other (See Comments)   Vertigo        Medication List     STOP taking these medications    aspirin EC 325 MG tablet  Replaced by: aspirin 81 MG chewable tablet   B-D UF III MINI PEN NEEDLES 31G X 5 MM Misc Generic drug: Insulin Pen Needle       TAKE these medications    aspirin 81 MG chewable tablet Chew 1 tablet (81 mg total) by mouth daily. Replaces: aspirin EC 325 MG tablet   atorvastatin 40 MG tablet Commonly known as: LIPITOR Take 1  tablet (40 mg total) by mouth daily. What changed: when to take this   benazepril 40 MG tablet Commonly known as: LOTENSIN Take 20 mg by mouth in the morning and at bedtime.   chlorthalidone 25 MG tablet Commonly known as: HYGROTON Take 25 mg by mouth daily.   clopidogrel 75 MG tablet Commonly known as: PLAVIX Take 1 tablet (75 mg total) by mouth daily. Start taking on: May 08, 2023 What changed:  See the new instructions. These instructions start on May 08, 2023. If you are unsure what to do until then, ask your doctor or other care provider.   ezetimibe 10 MG tablet Commonly known as: ZETIA Take 10 mg by mouth daily.   FreeStyle Libre 14 Day Sensor Misc Inject 1 Device into the skin every 14 (fourteen) days.   metFORMIN 500 MG 24 hr tablet Commonly known as: GLUCOPHAGE-XR Take 500 mg by mouth in the morning and at bedtime.   Mounjaro 5 MG/0.5ML Pen Generic drug: tirzepatide Inject 5 mg into the skin every Monday.   ticagrelor 90 MG Tabs tablet Commonly known as: BRILINTA Take 1 tablet (90 mg total) by mouth 2 (two) times daily.   Toujeo SoloStar 300 UNIT/ML Solostar Pen Generic drug: insulin glargine (1 Unit Dial) Inject 40 Units into the skin in the morning. What changed:  how much to take when to take this   Vitamin D3 1000 units Caps Take 1,000 Units by mouth in the morning and at bedtime.        Follow-up Information     [facility]. Schedule an appointment as soon as possible for a visit.   Specialty: Rehabilitation Contact information: [address] [place] [address] [phone]        [facility]. Schedule an appointment as soon as possible for a visit in 1 month(s).   Specialty: Neurology Why: stroke clinic Contact information: [address] [place] [address] [phone]               Allergies   Allergen Reactions   Jardiance [Empagliflozin] Other (See Comments)    To the hospital- due to complications   Fluoxetine Other (See Comments)    Prefers to NOT take this   Penicillins Other (See Comments)    CHILDHOOD ALLERGY- exact reaction? Has patient had a PCN reaction causing immediate rash, facial/tongue/throat swelling, SOB or lightheadedness with hypotension: Unknown Has patient had a PCN reaction causing severe rash involving mucus membranes or skin necrosis: Unknown Has patient had a PCN reaction that required hospitalization: Unknown Has patient had a PCN reaction occurring within the last 10 years: Unknown If all of the above answers are "NO", then may proceed with Cephalosporin use.     Silodosin Other (See Comments)    Vertigo     Consultations: Neurology   Procedures/Studies: ECHOCARDIOGRAM COMPLETE  Result Date: 04/07/2023    ECHOCARDIOGRAM REPORT   Patient Name:   KADEEM MCKISSIC Date of Exam: 04/07/2023 Medical Rec #:  [id number]  Height:       71.0 in Accession #:    [id number]       Weight:       217.0 lb Date of Birth:  07-17-1950        BSA:          2.183 m Patient Age:    72 years         BP:           128/74 mmHg Patient Gender: M                HR:           67 bpm. Exam Location:  Inpatient Procedure: 2D Echo and Limited Color Doppler Indications:    I63.9 Strike  History:        Patient has prior history of Echocardiogram examinations, most                 recent 10/09/2022. CHF, COPD and Stroke; Risk                 Factors:Hypertension, Diabetes and Sleep Apnea.  Sonographer:    Dondra Prader RVT RCS Referring Phys: [facility] GOEL  Sonographer Comments: Technically challenging study due to limited acoustic windows and Technically difficult study due to poor echo windows. Image acquisition challenging due to uncooperative patient. Patient refused to finish with ECHO. He said it is uncomfortable. He would not lie still which made the test difficult to perform  IMPRESSIONS  1. Left ventricular ejection fraction, by estimation, is 60 to 65%. The left ventricle has normal function. Left ventricular endocardial border not optimally defined to evaluate regional wall motion. Left ventricular diastolic function could not be evaluated.  2. Right ventricular systolic function is mildly reduced. The right ventricular size is mildly enlarged.  3. The mitral valve is normal in structure. Trivial mitral valve regurgitation. No evidence of mitral stenosis.  4. The aortic valve is calcified. Aortic valve regurgitation is not visualized. Aortic valve sclerosis/calcification is present, without any evidence of aortic stenosis. Conclusion(s)/Recommendation(s): Very limited echo with poor images. LV is grossly normal. RV appears mildly dilated and hypokinetic but is imaged off axis and is not well seen. FINDINGS  Left Ventricle: Left ventricular ejection fraction, by estimation, is 60 to 65%. The left ventricle has normal function. Left ventricular endocardial border not optimally defined to evaluate regional wall motion. The left ventricular internal cavity size was normal in size. There is no left ventricular hypertrophy. Left ventricular diastolic function could not be evaluated. Right Ventricle: The right ventricular size is mildly enlarged. No increase in right ventricular wall thickness. Right ventricular systolic function is mildly reduced. Left Atrium: Left atrial size was normal in size. Right Atrium: Right atrial size was normal in size. Pericardium: There is no evidence of pericardial effusion. Mitral Valve: The mitral valve is normal in structure. Trivial mitral valve regurgitation. No evidence of mitral valve stenosis. Tricuspid Valve: The tricuspid valve is grossly normal. Tricuspid valve regurgitation is trivial. No evidence of tricuspid stenosis. Aortic Valve: The aortic valve is calcified. Aortic valve regurgitation is not visualized. Aortic valve sclerosis/calcification is  present, without any evidence of aortic stenosis. Pulmonic Valve: The pulmonic valve was normal in structure. Pulmonic valve regurgitation is not visualized. No evidence of pulmonic stenosis. Aorta: The aortic arch was not well visualized and the ascending aorta was not well visualized. IAS/Shunts: No atrial level shunt detected by color flow Doppler.  LEFT VENTRICLE PLAX 2D LVIDd:  4.60 cm LVIDs:         3.20 cm LV PW:         1.00 cm LV IVS:        0.80 cm LVOT diam:     2.75 cm LVOT Area:     5.94 cm  LEFT ATRIUM         Index LA diam:    3.80 cm 1.74 cm/m   SHUNTS Systemic Diam: 2.75 cm Arvilla Meres MD Electronically signed by Arvilla Meres MD Signature Date/Time: 04/07/2023/11:05:05 AM    Final    CT ANGIO HEAD NECK W WO CM  Result Date: 04/06/2023 CLINICAL DATA:  Stroke/TIA, determine embolic source. EXAM: CT ANGIOGRAPHY HEAD AND NECK WITH AND WITHOUT CONTRAST TECHNIQUE: Multidetector CT imaging of the head and neck was performed using the standard protocol during bolus administration of intravenous contrast. Multiplanar CT image reconstructions and MIPs were obtained to evaluate the vascular anatomy. Carotid stenosis measurements (when applicable) are obtained utilizing NASCET criteria, using the distal internal carotid diameter as the denominator. RADIATION DOSE REDUCTION: This exam was performed according to the departmental dose-optimization program which includes automated exposure control, adjustment of the mA and/or kV according to patient size and/or use of iterative reconstruction technique. CONTRAST:  75mL OMNIPAQUE IOHEXOL 350 MG/ML SOLN COMPARISON:  CTA head/neck Oct 08, 2022. FINDINGS: CT HEAD FINDINGS Brain: No evidence of acute large vascular territory infarction, hemorrhage, hydrocephalus, extra-axial collection or mass lesion/mass effect. Vascular: See below. Skull: No acute fracture. Sinuses/Orbits: Clear sinuses.  No acute orbital findings. Review of the MIP images  confirms the above findings CTA NECK FINDINGS Aortic arch: Great vessel origins are patent without significant stenosis. Right carotid system: No evidence of dissection, stenosis (50% or greater), or occlusion. Left carotid system: No evidence of dissection, stenosis (50% or greater), or occlusion. Vertebral arteries: Mild narrowing of the right vertebral artery proximally. No evidence of dissection, stenosis (50% or greater), or occlusion. Skeleton: No acute abnormality on limited assessment. Other neck: No acute abnormality on limited assessment. Upper chest: Visualized lung apices are clear. Review of the MIP images confirms the above findings CTA HEAD FINDINGS Anterior circulation: Bilateral intracranial ICAs, MCAs, and ACAs are patent without proximal hemodynamically significant stenosis. Similar diminutive distal right ACA. Posterior circulation: Bilateral intradural vertebral arteries, basilar artery and bilateral posterior cerebral arteries are patent without proximal hemodynamically significant stenosis. Venous sinuses: As permitted by contrast timing, patent. Review of the MIP images confirms the above findings IMPRESSION: No emergent large vessel occlusion or proximal high-grade stenosis. Similar diminutive distal right ACA. Electronically Signed   By: Feliberto Harts M.D.   On: 04/06/2023 17:01   MR Brain Wo Contrast (neuro protocol)  Result Date: 04/06/2023 CLINICAL DATA:  Headache, neuro deficit. Dizziness. Unsteady gait. History of stroke. EXAM: MRI HEAD WITHOUT CONTRAST TECHNIQUE: Multiplanar, multiecho pulse sequences of the brain and surrounding structures were obtained without intravenous contrast. COMPARISON:  Head MRI 10/08/2022 FINDINGS: Image quality is degraded by motion artifact and reduced signal to noise primarily anteriorly. Brain: There are small acute to subacute cortical and subcortical infarcts in the left frontal and parietal lobes, and there is also a small acute to early  subacute infarct involving the periventricular white matter in the posterior right temporal lobe. There is mild encephalomalacia medially in the left frontoparietal region corresponding to an area of acute infarction on the prior MRI. T2 hyperintensities elsewhere in the cerebral white matter are similar to the prior study and nonspecific but compatible  with mild chronic small vessel ischemic disease. A small chronic infarct is again seen involving the right frontal periventricular white matter, and there may be 1 or 2 tiny chronic right cerebellar infarcts. There is mild cerebral atrophy. No intracranial hemorrhage, mass, midline shift, or extra-axial fluid collection is identified. Vascular: Major intracranial vascular flow voids are preserved. Skull and upper cervical spine: Unremarkable bone marrow signal. Sinuses/Orbits: Unremarkable orbits. Mild mucosal thickening in the ethmoid sinuses. Clear mastoid air cells. Other: None. IMPRESSION: 1. Small acute to subacute infarcts in the left frontal and parietal lobes and right temporal white matter. 2. Mild chronic small vessel ischemic disease. Electronically Signed   By: Sebastian Ache M.D.   On: 04/06/2023 16:14   CUP PACEART REMOTE DEVICE CHECK  Result Date: 03/30/2023 ILR summary report received. Battery status OK. Normal device function. No new symptom, tachy, brady, or pause episodes. No new AF episodes. AF burden is 0% of the time.  Monthly summary reports and ROV/PRN ML, CVRS  (Echo, Carotid, EGD, Colonoscopy, ERCP)    Subjective: No complaints  Discharge Exam: Vitals:   04/08/23 0330 04/08/23 0719  BP: 137/66 (!) 139/53  Pulse: 61 65  Resp: 18 18  Temp: 98.9 F (37.2 C) 98.2 F (36.8 C)  SpO2: 97% 95%   Vitals:   04/07/23 1607 04/07/23 1932 04/08/23 0330 04/08/23 0719  BP: 117/63 (!) 129/56 137/66 (!) 139/53  Pulse: 64 (!) 59 61 65  Resp: 17 16 18 18   Temp: 98.2 F (36.8 C) 98.1 F (36.7 C) 98.9 F (37.2 C) 98.2 F (36.8 C)   TempSrc: Oral Oral Oral Oral  SpO2: 97% 97% 97% 95%  Weight:      Height:        General: Pt is alert, awake, not in acute distress Cardiovascular: RRR, S1/S2 +, no rubs, no gallops Respiratory: CTA bilaterally, no wheezing, no rhonchi Abdominal: Soft, NT, ND, bowel sounds + Extremities: no edema, no cyanosis    The results of significant diagnostics from this hospitalization (including imaging, microbiology, ancillary and laboratory) are listed below for reference.     Microbiology: No results found for this or any previous visit (from the past 240 hour(s)).   Labs: BNP (last 3 results) No results for input(s): "BNP" in the last 8760 hours. Basic Metabolic Panel: Recent Labs  Lab 04/06/23 1141 04/06/23 1750 04/07/23 0458  NA 135 140 138  K 3.4* 3.4* 3.7  CL 101 98 101  CO2 25  --  28  GLUCOSE 174* 98 108*  BUN 18 15 12   CREATININE 0.89 1.00 1.08  CALCIUM 9.7  --  9.3   Liver Function Tests: Recent Labs  Lab 04/07/23 0458  AST 17  ALT 16  ALKPHOS 38  BILITOT 1.2*  PROT 5.9*  ALBUMIN 3.1*   No results for input(s): "LIPASE", "AMYLASE" in the last 168 hours. No results for input(s): "AMMONIA" in the last 168 hours. CBC: Recent Labs  Lab 04/06/23 1141 04/06/23 1750 04/07/23 0458 04/08/23 0555  WBC 9.5  --  8.6 9.4  NEUTROABS 6.7  --   --   --   HGB 12.5* 11.9* 12.1* 12.2*  HCT 37.0* 35.0* 36.0* 36.2*  MCV 89.4  --  89.3 87.7  PLT 176  --  171 168   Cardiac Enzymes: No results for input(s): "CKTOTAL", "CKMB", "CKMBINDEX", "TROPONINI" in the last 168 hours. BNP: Invalid input(s): "POCBNP" CBG: Recent Labs  Lab 04/07/23 0642 04/07/23 1205 04/07/23 1608 04/07/23 2108 04/08/23 0617  GLUCAP 107* 125* 94 101* 103*   D-Dimer No results for input(s): "DDIMER" in the last 72 hours. Hgb A1c Recent Labs    04/07/23 0458  HGBA1C 8.2*   Lipid Profile Recent Labs    04/07/23 0458  CHOL 79  HDL 17*  LDLCALC 47  TRIG 75  CHOLHDL 4.6    Thyroid function studies No results for input(s): "TSH", "T4TOTAL", "T3FREE", "THYROIDAB" in the last 72 hours.  Invalid input(s): "FREET3" Anemia work up No results for input(s): "VITAMINB12", "FOLATE", "FERRITIN", "TIBC", "IRON", "RETICCTPCT" in the last 72 hours. Urinalysis    Component Value Date/Time   COLORURINE YELLOW 04/06/2023 1704   APPEARANCEUR CLEAR 04/06/2023 1704   LABSPEC 1.020 04/06/2023 1704   PHURINE 7.0 04/06/2023 1704   GLUCOSEU NEGATIVE 04/06/2023 1704   HGBUR NEGATIVE 04/06/2023 1704   BILIRUBINUR NEGATIVE 04/06/2023 1704   KETONESUR NEGATIVE 04/06/2023 1704   PROTEINUR NEGATIVE 04/06/2023 1704   NITRITE NEGATIVE 04/06/2023 1704   LEUKOCYTESUR NEGATIVE 04/06/2023 1704   Sepsis Labs Recent Labs  Lab 04/06/23 1141 04/07/23 0458 04/08/23 0555  WBC 9.5 8.6 9.4   Microbiology No results found for this or any previous visit (from the past 240 hour(s)).   Time coordinating discharge: Over 35 minutes  SIGNED:   Marinda Elk, MD  [facility] 04/08/2023, 7:26 AM Pager   If 7PM-7AM, please contact night-coverage [email or web address] Password [email or web address]

## 2023-04-08 NOTE — Progress Notes (Signed)
Physical Therapy Treatment Patient Details Name: Kenneth [name] MRN: [id number] DOB: 09-10-1950 Today's Date: 04/08/2023   History of Present Illness Kenneth [name] is a 72 y.o. man who presented to ED on 04/07/23 after two-week hx of dizziness/unsteadiness.  MRI: Small acute to subacute infarcts in the left frontal and parietal  lobes and right temporal white matter. 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    PT Comments  Pt greeted up in chair and agreeable to session. Pt demonstrating good sitting and static standing balance, able to don pants and shoes without LOB. Pt requiring CGA for gait with SPC with x1 LOB with quick change in direction, with pt able to self correct. Educated pt on slowing/stopping prior to turning with pt verbalizing understanding and demonstrating with good return. Pt was educated on continued [facility] use to maximize functional independence, safety, and decrease risk for falls. Current plan remains appropriate to address deficits and maximize functional independence and safety. Pt continues to benefit from skilled PT services to progress toward functional mobility goals.      If plan is discharge home, recommend the following: A little help with walking and/or transfers;A little help with bathing/dressing/bathroom;Assistance with cooking/housework;Help with stairs or ramp for entrance   Can travel by private vehicle        Equipment Recommendations  None recommended by PT    Recommendations for Other Services       Precautions / Restrictions Precautions Precautions: Fall Restrictions Weight Bearing Restrictions: No     Mobility  Bed Mobility Overal bed mobility: Needs Assistance             General bed mobility comments: pt up in chair on arrival     Transfers Overall transfer level: Needs assistance Equipment used: Straight cane Transfers: Sit to/from Stand Sit to Stand: Contact guard assist           General transfer comment: Performed x 4 with cane    Ambulation/Gait Ambulation/Gait assistance: Contact guard assist Gait Distance (Feet): 400 Feet Assistive device: Straight cane Gait Pattern/deviations: Step-through pattern Gait velocity: decreased     General Gait Details: grossly steady, x1 LOB with quick change in direction, pt able to self correct, educated on slowing before changing directions.   Stairs             Wheelchair Mobility     Tilt Bed    Modified Rankin (Stroke Patients Only) Modified Rankin (Stroke Patients Only) Pre-Morbid Rankin Score: No symptoms Modified Rankin: Slight disability     Balance Overall balance assessment: Needs assistance Sitting-balance support: Feet supported Sitting balance-Leahy Scale: Good Sitting balance - Comments: able to reach to feet and balance to don pants and shoes without LOB   Standing balance support: No upper extremity supported, During functional activity Standing balance-Leahy Scale: Fair Standing balance comment: Cane to ambulate but could static stand without UE support to pull up pants                            Cognition Arousal: Alert Behavior During Therapy: WFL for tasks assessed/performed Overall Cognitive Status: Within Functional Limits for tasks assessed  Exercises      General Comments        Pertinent Vitals/Pain Pain Assessment Pain Assessment: Faces Faces Pain Scale: Hurts a little bit Pain Location: L thigh cramp Pain Descriptors / Indicators: Discomfort, Grimacing Pain Intervention(s): Monitored during session, Limited activity within patient's tolerance, Heat applied    Home Living                          Prior Function             PT Goals (current goals can now be found in the care plan section) Acute Rehab PT Goals Patient Stated Goal: return home PT Goal Formulation: With patient Time For Goal Achievement: 04/21/23 Progress towards PT goals: Progressing toward goals    Frequency    Min 1X/week      PT Plan      Co-evaluation              AM-PAC PT "6 Clicks" Mobility   Outcome Measure  Help needed turning from your back to your side while in a flat bed without using bedrails?: None Help needed moving from lying on your back to sitting on the side of a flat bed without using bedrails?: A Little Help needed moving to and from a bed to a chair (including a wheelchair)?: A Little Help needed standing up from a chair using your arms (e.g., wheelchair or bedside chair)?: A Little Help needed to walk in hospital room?: A Little Help needed climbing 3-5 steps with a railing? : A Little 6 Click Score: 19    End of Session Equipment Utilized During Treatment: Gait belt Activity Tolerance: Patient tolerated treatment well Patient left: in chair;with call bell/phone within reach Nurse Communication: Mobility status PT Visit Diagnosis: Other abnormalities of gait and mobility (R26.89);Muscle weakness (generalized) (M62.81)     Time: 1027-2536 PT Time Calculation (min) (ACUTE ONLY): 24 min  Charges:    $Gait Training: 8-22 mins $Therapeutic Activity: 8-22 mins                       Meryle Pugmire R. PTA Acute Rehabilitation Services Office: [phone]   Catalina Antigua 04/08/2023, 9:33 AM

## 2023-04-08 NOTE — Progress Notes (Signed)
Pt is discharging home today no changes from note yesterday.

## 2023-04-11 ENCOUNTER — Encounter (HOSPITAL_COMMUNITY): Payer: Self-pay | Admitting: Emergency Medicine

## 2023-04-11 ENCOUNTER — Other Ambulatory Visit: Payer: Self-pay

## 2023-04-11 ENCOUNTER — Other Ambulatory Visit (HOSPITAL_COMMUNITY): Payer: Self-pay

## 2023-04-11 ENCOUNTER — Emergency Department (HOSPITAL_COMMUNITY): Payer: Medicare Other

## 2023-04-11 ENCOUNTER — Emergency Department (HOSPITAL_COMMUNITY)
Admission: EM | Admit: 2023-04-11 | Discharge: 2023-04-11 | Disposition: A | Discharge status: Home / Self Care | Payer: Medicare Other | Attending: Emergency Medicine | Admitting: Emergency Medicine

## 2023-04-11 DIAGNOSIS — G459 Transient cerebral ischemic attack, unspecified: Secondary | ICD-10-CM | POA: Diagnosis not present

## 2023-04-11 DIAGNOSIS — J069 Acute upper respiratory infection, unspecified: Secondary | ICD-10-CM | POA: Insufficient documentation

## 2023-04-11 DIAGNOSIS — Z7902 Long term (current) use of antithrombotics/antiplatelets: Secondary | ICD-10-CM | POA: Insufficient documentation

## 2023-04-11 DIAGNOSIS — Z8673 Personal history of transient ischemic attack (TIA), and cerebral infarction without residual deficits: Secondary | ICD-10-CM | POA: Diagnosis not present

## 2023-04-11 DIAGNOSIS — R059 Cough, unspecified: Secondary | ICD-10-CM | POA: Diagnosis not present

## 2023-04-11 DIAGNOSIS — R531 Weakness: Secondary | ICD-10-CM

## 2023-04-11 DIAGNOSIS — Z7982 Long term (current) use of aspirin: Secondary | ICD-10-CM | POA: Insufficient documentation

## 2023-04-11 DIAGNOSIS — Z794 Long term (current) use of insulin: Secondary | ICD-10-CM | POA: Insufficient documentation

## 2023-04-11 DIAGNOSIS — Z4789 Encounter for other orthopedic aftercare: Secondary | ICD-10-CM | POA: Diagnosis not present

## 2023-04-11 DIAGNOSIS — R918 Other nonspecific abnormal finding of lung field: Secondary | ICD-10-CM | POA: Diagnosis not present

## 2023-04-11 DIAGNOSIS — R197 Diarrhea, unspecified: Secondary | ICD-10-CM | POA: Diagnosis not present

## 2023-04-11 DIAGNOSIS — J929 Pleural plaque without asbestos: Secondary | ICD-10-CM | POA: Diagnosis not present

## 2023-04-11 DIAGNOSIS — Z1152 Encounter for screening for COVID-19: Secondary | ICD-10-CM | POA: Insufficient documentation

## 2023-04-11 DIAGNOSIS — I10 Essential (primary) hypertension: Secondary | ICD-10-CM | POA: Diagnosis not present

## 2023-04-11 DIAGNOSIS — I959 Hypotension, unspecified: Secondary | ICD-10-CM | POA: Diagnosis not present

## 2023-04-11 LAB — COMPREHENSIVE METABOLIC PANEL
ALT: 17 U/L (ref 0–44)
AST: 20 U/L (ref 15–41)
Albumin: 3.2 g/dL — ABNORMAL LOW (ref 3.5–5.0)
Alkaline Phosphatase: 43 U/L (ref 38–126)
Anion gap: 11 (ref 5–15)
BUN: 16 mg/dL (ref 8–23)
CO2: 24 mmol/L (ref 22–32)
Calcium: 9.1 mg/dL (ref 8.9–10.3)
Chloride: 99 mmol/L (ref 98–111)
Creatinine, Ser: 0.9 mg/dL (ref 0.61–1.24)
GFR, Estimated: >60 mL/min (ref >60)
Glucose, Bld: 153 mg/dL — ABNORMAL HIGH (ref 70–99)
Potassium: 3.2 mmol/L — ABNORMAL LOW (ref 3.5–5.1)
Sodium: 134 mmol/L — ABNORMAL LOW (ref 135–145)
Total Bilirubin: 1.2 mg/dL — ABNORMAL HIGH (ref <1.2)
Total Protein: 6 g/dL — ABNORMAL LOW (ref 6.5–8.1)

## 2023-04-11 LAB — CBC WITH DIFFERENTIAL/PLATELET
Abs Immature Granulocytes: 0.03 10*3/uL (ref 0.00–0.07)
Basophils Absolute: 0 10*3/uL (ref 0.0–0.1)
Basophils Relative: 0 %
Eosinophils Absolute: 0.2 10*3/uL (ref 0.0–0.5)
Eosinophils Relative: 2 %
HCT: 36.2 % — ABNORMAL LOW (ref 39.0–52.0)
Hemoglobin: 12.3 g/dL — ABNORMAL LOW (ref 13.0–17.0)
Immature Granulocytes: 0 %
Lymphocytes Relative: 14 %
Lymphs Abs: 1.2 10*3/uL (ref 0.7–4.0)
MCH: 29.7 pg (ref 26.0–34.0)
MCHC: 34 g/dL (ref 30.0–36.0)
MCV: 87.4 fL (ref 80.0–100.0)
Monocytes Absolute: 1.4 10*3/uL — ABNORMAL HIGH (ref 0.1–1.0)
Monocytes Relative: 17 %
Neutro Abs: 5.7 10*3/uL (ref 1.7–7.7)
Neutrophils Relative %: 67 %
Platelets: 153 10*3/uL (ref 150–400)
RBC: 4.14 MIL/uL — ABNORMAL LOW (ref 4.22–5.81)
RDW: 12.7 % (ref 11.5–15.5)
WBC: 8.5 10*3/uL (ref 4.0–10.5)
nRBC: 0 % (ref 0.0–0.2)

## 2023-04-11 LAB — URINALYSIS, ROUTINE W REFLEX MICROSCOPIC
Bilirubin Urine: NEGATIVE
Glucose, UA: NEGATIVE mg/dL
Hgb urine dipstick: NEGATIVE
Ketones, ur: NEGATIVE mg/dL
Leukocytes,Ua: NEGATIVE
Nitrite: NEGATIVE
Protein, ur: NEGATIVE mg/dL
Specific Gravity, Urine: 1.008 (ref 1.005–1.030)
pH: 5 (ref 5.0–8.0)

## 2023-04-11 LAB — RESP PANEL BY RT-PCR (RSV, FLU A&B, COVID)  RVPGX2
Influenza A by PCR: NEGATIVE
Influenza B by PCR: NEGATIVE
Resp Syncytial Virus by PCR: NEGATIVE
SARS Coronavirus 2 by RT PCR: NEGATIVE

## 2023-04-11 LAB — CBG MONITORING, ED: Glucose-Capillary: 132 mg/dL — ABNORMAL HIGH (ref 70–99)

## 2023-04-11 LAB — I-STAT CG4 LACTIC ACID, ED: Lactic Acid, Venous: 1.2 mmol/L (ref 0.5–1.9)

## 2023-04-11 MED ORDER — AZITHROMYCIN 250 MG PO TABS
500.0000 mg | ORAL_TABLET | Freq: Once | ORAL | Status: AC
  Administered 2023-04-11: 500 mg via ORAL
  Filled 2023-04-11: qty 2

## 2023-04-11 MED ORDER — SODIUM CHLORIDE 0.9 % IV BOLUS
1000.0000 mL | Freq: Once | INTRAVENOUS | Status: AC
  Administered 2023-04-11: 1000 mL via INTRAVENOUS

## 2023-04-11 MED ORDER — CEFPODOXIME PROXETIL 200 MG PO TABS: 200.0000 mg | ORAL_TABLET | Freq: Two times a day (BID) | ORAL | 0 refills | Status: AC

## 2023-04-11 MED ORDER — POTASSIUM CHLORIDE CRYS ER 20 MEQ PO TBCR
40.0000 meq | EXTENDED_RELEASE_TABLET | Freq: Once | ORAL | Status: AC
  Administered 2023-04-11: 40 meq via ORAL
  Filled 2023-04-11: qty 2

## 2023-04-11 MED ORDER — AZITHROMYCIN 250 MG PO TABS: 250.0000 mg | ORAL_TABLET | Freq: Every day | ORAL | 0 refills | Status: DC

## 2023-04-11 MED ORDER — CEFPODOXIME PROXETIL 200 MG PO TABS
200.0000 mg | ORAL_TABLET | Freq: Two times a day (BID) | ORAL | 0 refills | Status: DC
  Filled 2023-04-11: qty 14, 7d supply, fill #0

## 2023-04-11 MED ORDER — AZITHROMYCIN 250 MG PO TABS
250.0000 mg | ORAL_TABLET | Freq: Every day | ORAL | 0 refills | Status: DC
  Filled 2023-04-11: qty 6, 6d supply, fill #0

## 2023-04-11 NOTE — ED Provider Notes (Signed)
[place] EMERGENCY DEPARTMENT AT [facility] Provider Note   CSN: [id number] Arrival date & time: 04/11/23  1029     History  Chief Complaint  Patient presents with   Hypotension    Kenneth [name] is a 72 y.o. male, hx of multiple CVAs, who presents to the ED 2/2 to Hu presents to the ED secondary to weakness, it has been going on for the last few days.  He states he was discharged from the hospital on Wednesday, for stroke, and has felt weak since then.  He reports a bit of a cough, and some shortness of breath.  Denies any abdominal pain, nausea, vomiting, but had 1 episode of diarrhea today.  He is not having any kind of fevers or chills.  Denies any weakness on one side of the body, states generalized weakness, and has been eating less than usual    Home Medications Prior to Admission medications   Medication Sig Start Date End Date Taking? Authorizing Provider  cefpodoxime (VANTIN) 200 MG tablet Take 1 tablet (200 mg total) by mouth 2 (two) times daily for 7 days. 04/11/23 04/18/23 Yes Daryl Beehler L, PA  aspirin 81 MG chewable tablet Chew 1 tablet (81 mg total) by mouth daily. 04/08/23   Marinda Elk, MD  atorvastatin (LIPITOR) 40 MG tablet Take 1 tablet (40 mg total) by mouth daily. Patient taking differently: Take 40 mg by mouth at bedtime. 10/17/22   Love, Evlyn Kanner, PA-C  azithromycin (ZITHROMAX) 250 MG tablet Take 1 tablet (250 mg total) by mouth daily. Take first 2 tablets together, then 1 every day until finished. 04/11/23   Juaquin Ludington L, PA  benazepril (LOTENSIN) 40 MG tablet Take 20 mg by mouth in the morning and at bedtime.    [provider]  chlorthalidone (HYGROTON) 25 MG tablet Take 25 mg by mouth daily.    [provider]  Cholecalciferol (VITAMIN D3) 1000 units CAPS Take 1,000 Units by mouth in the morning and at bedtime.    [provider]  clopidogrel (PLAVIX) 75 MG tablet Take 1 tablet (75 mg total) by mouth  daily. 05/08/23   Marinda Elk, MD  Continuous Glucose Sensor (FREESTYLE LIBRE 14 DAY SENSOR) MISC Inject 1 Device into the skin every 14 (fourteen) days.    [provider]  ezetimibe (ZETIA) 10 MG tablet Take 10 mg by mouth daily. 01/19/20   [provider]  metFORMIN (GLUCOPHAGE-XR) 500 MG 24 hr tablet Take 500 mg by mouth in the morning and at bedtime.    [provider]  MOUNJARO 5 MG/0.5ML Pen Inject 5 mg into the skin every Monday.    [provider]  ticagrelor (BRILINTA) 90 MG TABS tablet Take 1 tablet (90 mg total) by mouth 2 (two) times daily. 04/08/23 05/08/23  Marinda Elk, MD  TOUJEO SOLOSTAR 300 UNIT/ML Solostar Pen Inject 40 Units into the skin in the morning. 04/08/23   Marinda Elk, MD      Allergies    Jardiance [empagliflozin], Fluoxetine, Penicillins, and Silodosin    Review of Systems   Review of Systems  Respiratory:  Positive for cough and shortness of breath.   Cardiovascular:  Positive for chest pain.    Physical Exam Updated Vital Signs BP (!) 121/55   Pulse (!) 53   Temp 98.6 F (37 C) (Oral)   Resp 20   Ht 5\' 11"  (1.803 m)   Wt 98.3 kg   SpO2  100%   BMI 30.23 kg/m  Physical Exam Vitals and nursing note reviewed.  Constitutional:      General: He is not in acute distress.    Appearance: He is well-developed.  HENT:     Head: Normocephalic and atraumatic.  Eyes:     Conjunctiva/sclera: Conjunctivae normal.  Cardiovascular:     Rate and Rhythm: Normal rate and regular rhythm.     Heart sounds: No murmur heard. Pulmonary:     Effort: Pulmonary effort is normal. No respiratory distress.     Breath sounds: Normal breath sounds.  Abdominal:     Palpations: Abdomen is soft.     Tenderness: There is no abdominal tenderness.  Musculoskeletal:        General: No swelling.     Cervical back: Neck supple.  Skin:    General: Skin is warm and dry.     Capillary Refill: Capillary refill takes  less than 2 seconds.  Neurological:     Mental Status: He is alert.  Psychiatric:        Mood and Affect: Mood normal.     ED Results / Procedures / Treatments   Labs (all labs ordered are listed, but only abnormal results are displayed) Labs Reviewed  CBC WITH DIFFERENTIAL/PLATELET - Abnormal; Notable for the following components:      Result Value   RBC 4.14 (*)    Hemoglobin 12.3 (*)    HCT 36.2 (*)    Monocytes Absolute 1.4 (*)    All other components within normal limits  COMPREHENSIVE METABOLIC PANEL - Abnormal; Notable for the following components:   Sodium 134 (*)    Potassium 3.2 (*)    Glucose, Bld 153 (*)    Total Protein 6.0 (*)    Albumin 3.2 (*)    Total Bilirubin 1.2 (*)    All other components within normal limits  CBG MONITORING, ED - Abnormal; Notable for the following components:   Glucose-Capillary 132 (*)    All other components within normal limits  RESP PANEL BY RT-PCR (RSV, FLU A&B, COVID)  [id number]  URINALYSIS, ROUTINE W REFLEX MICROSCOPIC  I-STAT CG4 LACTIC ACID, ED    EKG None  Radiology DG Chest 2 View  Result Date: 04/11/2023 CLINICAL DATA:  Weakness and cough EXAM: CHEST - 2 VIEW COMPARISON:  Chest radiograph dated 07/23/2017 FINDINGS: Implanted loop recorder within the left anterior chest wall. Normal lung volumes. Unchanged hazy and patchy right lower lung opacity adjacent to the pleural thickening. Unchanged thickening of the right lateral pleura. The heart size and mediastinal contours are within normal limits. Cervical spinal fixation hardware appears intact. IMPRESSION: Unchanged hazy and patchy right lower lung opacity adjacent to the pleural thickening, which may represent atelectasis or scarring. No new focal consolidations. Electronically Signed   By: Agustin Cree M.D.   On: 04/11/2023 11:51    Procedures Procedures    Medications Ordered in ED Medications  sodium chloride 0.9 % bolus 1,000 mL (0 mLs Intravenous Stopped 04/11/23  1243)    ED Course/ Medical Decision Making/ A&P                                 Medical Decision Making Patient is a 72 year old male, here for weakness, has been going on for the last few days, after being discharged with a CVA.  He states he walks with a walker or cane, and has been able  to, but just feels very weak.  Had 1 episode of diarrhea today, but is not having any abdominal pain.  He has no nausea, vomiting, but presents with a cough, and some shortness of breath.  Chest x-ray, COVID/flu ordered as well as urinalysis.  Was told to be hypotensive, by triage, however has not been hypotensive, while in the ER.  We will obtain a lactic acid, to rule out any kind of cell death.  Amount and/or Complexity of Data Reviewed Labs: ordered.    Details: Lactic acid within normal limits, no evidence of any kind of leukocytosis Radiology: ordered.    Details: Chest x-ray shows no acute findings, has similar findings to the past, no new focal consolidations Discussion of management or test interpretation with external provider(s): Patient is a week, and has been going on since being admitted.  Possible URI related given cough.  Chest x-ray is clear, but suspicious for pneumonia given lack of other etiology.  We will go ahead and prophylactically treat, discussed with Dr. Fredderick Phenix, she recommends that we ambulate the patient, make sure he does not become hypoxic and is able to ambulate.  Nursing informed, Evlyn Kanner, PA, to follow-up on this.  Otherwise antibiotic sent for prophylaxis of/pretreatment for possible pneumonia  Risk Prescription drug management.    Final Clinical Impression(s) / ED Diagnoses Final diagnoses:  Upper respiratory tract infection, unspecified type  Weakness    Rx / DC Orders ED Discharge Orders          Ordered    azithromycin (ZITHROMAX) 250 MG tablet  Daily,   Status:  Discontinued        04/11/23 1440    azithromycin (ZITHROMAX) 250 MG tablet  Daily         04/11/23 1441    cefpodoxime (VANTIN) 200 MG tablet  2 times daily        04/11/23 1442              Aiyden Lauderback Elbert Ewings, [place] 04/11/23 1511    Rolan Bucco, MD 04/11/23 1538

## 2023-04-11 NOTE — ED Notes (Addendum)
Pt SpO2 was initially 100 prior to ambulation. During ambulation, SpO2 dropped intermittently to 91 every few seconds but returned to 100. No signs of distress or shortness of breath. Pt however reported lightheadedness while ambulating back to room

## 2023-04-11 NOTE — ED Notes (Signed)
Kenneth [name] (wife) would like updates at [phone].

## 2023-04-11 NOTE — ED Triage Notes (Signed)
Pt BIB [facility] from home due to having diarrhea this morning.  Aide reports he could not get off the toilet this morning due to weakness.  Pt BP 70/68 on arrival.  Pt was in hospital Monday 04/06/23 due TIA and released on Wednesday.  Pt reports having weakness since he was released.  Last BP 100/78.

## 2023-04-11 NOTE — Discharge Instructions (Addendum)
Your blood pressure was reassuring, it is possible that you just have a upper respiratory infection, that may be turning into pneumonia.  I have sent you some antibiotics to the pharmacy, please take these, as prescribed, and return if you feel like your symptoms are worsening.  Make sure you are drinking lots of fluids, and resting.  Your blood pressure has been good p.o. while you have been in the ER.

## 2023-04-11 NOTE — ED Provider Notes (Signed)
Patient given in sign out by [employer], PA-C.  Please review their note for patient HPI, physical exam, workup.  At this time the plan is to ambulate patient to make sure he does not become hypoxic and discharged with primary care follow-up.  Patient was ambulated and did not become hypoxic.  At this time patient is stable to be discharged with outpatient follow-up.  Previous provider did send in the antibiotic prescriptions for suspected pneumonia.  Patient was given return precautions.  Patient stable to be discharged.  Patient verbalized understanding acceptance of this plan.    Netta Corrigan, PA-C 04/11/23 1609    Royanne Foots, DO 04/18/23 1046

## 2023-04-12 ENCOUNTER — Telehealth: Payer: Self-pay

## 2023-04-12 NOTE — Telephone Encounter (Signed)
Wife of patient called to state that z pac was processed , but they did not have the Vantin order at the pharmacy. Called [facility] on [address] and ordered Vantin per wiritten order

## 2023-04-13 ENCOUNTER — Telehealth: Payer: Self-pay

## 2023-04-13 DIAGNOSIS — E1165 Type 2 diabetes mellitus with hyperglycemia: Secondary | ICD-10-CM | POA: Diagnosis not present

## 2023-04-13 DIAGNOSIS — C61 Malignant neoplasm of prostate: Secondary | ICD-10-CM | POA: Diagnosis not present

## 2023-04-13 DIAGNOSIS — Z7901 Long term (current) use of anticoagulants: Secondary | ICD-10-CM | POA: Diagnosis not present

## 2023-04-13 DIAGNOSIS — D649 Anemia, unspecified: Secondary | ICD-10-CM | POA: Diagnosis not present

## 2023-04-13 DIAGNOSIS — E785 Hyperlipidemia, unspecified: Secondary | ICD-10-CM | POA: Diagnosis not present

## 2023-04-13 DIAGNOSIS — I272 Pulmonary hypertension, unspecified: Secondary | ICD-10-CM | POA: Diagnosis not present

## 2023-04-13 DIAGNOSIS — E1129 Type 2 diabetes mellitus with other diabetic kidney complication: Secondary | ICD-10-CM | POA: Diagnosis not present

## 2023-04-13 DIAGNOSIS — G4733 Obstructive sleep apnea (adult) (pediatric): Secondary | ICD-10-CM | POA: Diagnosis not present

## 2023-04-13 DIAGNOSIS — Z794 Long term (current) use of insulin: Secondary | ICD-10-CM | POA: Diagnosis not present

## 2023-04-13 DIAGNOSIS — I639 Cerebral infarction, unspecified: Secondary | ICD-10-CM | POA: Diagnosis not present

## 2023-04-13 DIAGNOSIS — I10 Essential (primary) hypertension: Secondary | ICD-10-CM | POA: Diagnosis not present

## 2023-04-13 DIAGNOSIS — I6529 Occlusion and stenosis of unspecified carotid artery: Secondary | ICD-10-CM | POA: Diagnosis not present

## 2023-04-13 NOTE — Patient Outreach (Signed)
  Emmi Stroke Care Coordination Follow Up  04/13/2023 Name:  ZEIK LOEB MRN:  [id number] DOB:  [birth year]  Subjective: Kenneth [name] is a 72 y.o. year old male who is a primary care patient of [name], MD An Emmi alert was received indicating patient responded to questions: Scheduled a follow-up appointment?. I reached out by phone to follow up on the alert and spoke to Patient. Patient voices he is doing okay. Reviewed and addressed red alert. Pt voices he has PCP f/u appt this afternoon and neuro appt on 1/925. Denies any issues with transportation. He voices that he still is " a little unsteady with walker." He has been set up with outpt therapy at rehab center-appt on 04/16/23. However, he want to get it changed to HHPT instead. Discussed the process with pt. He will discuss with provider during appt today. He has supportive family in the home 24/7 helping/assisting him. Denies any issue with meds-did not wish to review all of them. States he is eating and sleeping well and no issues with elimination. He was in the ED over the weekend for resp sxs. He voices sxs improving and he is taking abx therapy as ordered. Denies any RN CM needs or concerns at this time.  Care Coordination Interventions:  Yes, provided   Follow up plan: Advised patient that they would continue to get automated EMMI-Stroke post discharge calls to assess how they are doing following recent hospitalization and will receive a call from a nurse if any of their responses were abnormal. Patient voiced understanding and was appreciative of f/u call.   Encounter Outcome:  Patient Visit Completed    Antionette Fairy, RN,BSN,CCM RN Care Manager Transitions of Care  [facility]  Direct Phone: [phone] Toll Free: [phone] Fax: [phone]

## 2023-04-13 NOTE — Patient Outreach (Signed)
Received a red flag Emmi stroke notification. I have assigned Roshanda Florance, RN to call for follow up and determine if there are any Case Management needs.    Laura Greeson, CBCS, CMAA [facility] Care Management Assistant [facility] Care Management [phone]  

## 2023-04-15 NOTE — Therapy (Incomplete)
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   History of colon polyps - adenomas 04/03/2010   Hyperlipidemia    Hypertension    Iron deficiency anemia, unspecified    OSA (obstructive sleep apnea)    refusing CPAP   Pneumonia 03/2022   w/effusion   Prostate cancer (HCC)    Stroke [facility])    Past Surgical History:  Procedure Laterality Date   ANTERIOR CERVICAL DECOMP/DISCECTOMY FUSION N/A 10/01/2012   Procedure: ANTERIOR CERVICAL DECOMPRESSION/DISCECTOMY FUSION 2 LEVELS;  Surgeon: Tia Alert, MD;  Location: [facility] NEURO ORS;  Service: Neurosurgery;  Laterality: N/A;  Cervical five-six,Cervical six-seven   COLONOSCOPY  09/30/2010, 03/01/2014   diverticulosis, small internal hemorrhoids   COLONOSCOPY W/ POLYPECTOMY  04/03/2010   4 polyps 8-10mm, worst TV adenoma with high-grade dysplasia   LOOP RECORDER INSERTION N/A 10/10/2022   Procedure: LOOP RECORDER INSERTION;  Surgeon: Marinus Maw, MD;  Location: [facility] INVASIVE CV LAB;  Service: Cardiovascular;  Laterality: N/A;   NO PAST SURGERIES     POLYPECTOMY     POSTERIOR CERVICAL FUSION/FORAMINOTOMY Left 10/20/2012   Procedure: C/5-6,C/6-7 [place]ami/Multi level,POSTERIOR CERVICAL FUSION/FORAMINOTOMY C/4-7;  Surgeon: Tia Alert, MD;  Location: [facility] NEURO ORS;  Service: Neurosurgery;  Laterality: Left;  Cervical Five-Six, Cervical Six-Seven Laminectomies, Posterior Cervical Fusion/Foraminotomies Cervical Four through Seven.    TONSILLECTOMY     Patient Active Problem List   Diagnosis Date Noted   Diabetes (HCC) 10/17/2022   Stroke  (cerebrum) (HCC) 10/10/2022   Essential hypertension 10/09/2022   Chronic diastolic CHF (congestive heart failure) (HCC) 10/09/2022   Acute CVA (cerebrovascular accident) (HCC) 10/08/2022   Malignant neoplasm of prostate (HCC) 05/25/2020   Obesity (BMI 30-39.9) 06/29/2019   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   Syncope 07/23/2017   Leukocytosis 07/23/2017   Hypokalemia 07/23/2017   UTI (urinary tract infection) 07/23/2017   Controlled type 2 diabetes mellitus without complication, without long-term current use of insulin (HCC) 02/10/2017   Mixed hyperlipidemia 02/10/2017   Hearing loss 02/10/2017   OSA (obstructive sleep apnea) 02/10/2017   History of colon polyps - adenomas 04/03/2010    ONSET DATE: ***  REFERRING DIAG: I63.9 (ICD-10-CM) - Cerebrovascular accident (CVA), unspecified mechanism (HCC)  THERAPY DIAG:  No diagnosis found.  Rationale for Evaluation and Treatment: {HABREHAB:27488}  SUBJECTIVE:  SUBJECTIVE STATEMENT: *** Pt accompanied by: {accompnied:27141}  PERTINENT HISTORY: ACDF C5-7 09/2012 complicated with fracture s/p posterior fusion 10/2012, DM, B hearing loss, HLD, HTN, anemia, prostate CA, bilateral ACA strokes (left greater than right, May 2024 with minimal residual weakness, loop recorder   PAIN:  Are you having pain? {OPRCPAIN:27236}  PRECAUTIONS: ICD/Pacemaker  RED FLAGS: {PT Red Flags:29287}   WEIGHT BEARING RESTRICTIONS: No  FALLS: Has patient fallen in last 6 months? {fallsyesno:27318}  LIVING ENVIRONMENT: Lives with: {OPRC lives with:25569::"lives with their family"} Lives in: {Lives in:25570} Stairs: {opstairs:27293} Has following equipment at home: {Assistive devices:23999}  PLOF: {PLOF:24004}  PATIENT GOALS: ***  OBJECTIVE:  Note: Objective measures  were completed at Evaluation unless otherwise noted.  DIAGNOSTIC FINDINGS: 04/06/23 MRI: Small acute to subacute infarcts in the left frontal and parietal  lobes and right temporal white matter  COGNITION: Overall cognitive status: {cognition:24006}   SENSATION: {sensation:27233}  COORDINATION: Alternating pronation/supination: *** Alternating toe tap: *** Finger to nose: *** Heel to shin: ***   MUSCLE TONE: {LE tone:25568}  POSTURE: {posture:25561}  LOWER EXTREMITY ROM:     Active  Right Eval Left Eval  Hip flexion    Hip extension    Hip abduction    Hip adduction    Hip internal rotation    Hip external rotation    Knee flexion    Knee extension    Ankle dorsiflexion    Ankle plantarflexion    Ankle inversion    Ankle eversion     (Blank rows = not tested)  LOWER EXTREMITY MMT:    MMT Right Eval Left Eval  Hip flexion    Hip extension    Hip abduction    Hip adduction    Hip internal rotation    Hip external rotation    Knee flexion    Knee extension    Ankle dorsiflexion    Ankle plantarflexion    Ankle inversion    Ankle eversion    (Blank rows = not tested)  GAIT: Gait pattern: {gait characteristics:25376} Distance walked: *** Assistive device utilized: {Assistive devices:23999} Level of assistance: {Levels of assistance:24026} Comments: ***  FUNCTIONAL TESTS:  {Functional tests:24029}  PATIENT SURVEYS:  FOTO ***  TODAY'S TREATMENT:                                                                                                                              DATE: ***    PATIENT EDUCATION: Education details: *** Person educated: {Person educated:25204} Education method: {Education Method:25205} Education comprehension: {Education Comprehension:25206}  HOME EXERCISE PROGRAM: ***  GOALS: Goals reviewed with patient? Yes  SHORT TERM GOALS: Target date: {follow up:25551}  Patient to be independent with initial HEP. Baseline: HEP  initiated Goal status: {GOALSTATUS:25110}    LONG TERM GOALS: Target date: {follow up:25551}  Patient to be independent with advanced HEP. Baseline: Not yet initiated  Goal status: {GOALSTATUS:25110}  Patient to demonstrate B LE strength >/=4+/5.  Baseline:  See above Goal status: {GOALSTATUS:25110}  Patient to demonstrate *** ROM WFL and without pain limiting.  Baseline: *** Goal status: {GOALSTATUS:25110}  Patient to report and demonstrate improved head, neck, and shoulder posture at rest and with activity.  Baseline: *** Goal status: {GOALSTATUS:25110}  Patient to demonstrate alternating reciprocal pattern when ascending and descending stairs with good stability and 1 handrail as needed.   Baseline: Unable Goal status: {GOALSTATUS:25110}  Patient to score at least 20/24 on DGI in order to decrease risk of falls.  Baseline: *** Goal status: {GOALSTATUS:25110}  Patient to complete TUG in <14 sec with LRAD in order to decrease risk of falls.   Baseline: *** Goal status: {GOALSTATUS:25110}  Patient to demonstrate 5xSTS test in <15 sec in order to decrease risk of falls.  Baseline: *** Goal status: {GOALSTATUS:25110}  Patient to score at least ***/56 on Berg in order to decrease risk of falls.  Baseline: *** Goal status: {GOALSTATUS:25110}  Patient to score at least *** on FOTO in order to indicate improved functional outcomes.  Baseline: *** Goal status: {GOALSTATUS:25110}  ASSESSMENT:  CLINICAL IMPRESSION:  Patient is a 72 y/o M presenting to OPPT with c/o *** after presenting to ED on 04/07/23 after two-week hx of dizziness/unsteadiness, found to have B strokes.   Patient today presenting with ***.    Patient was educated on gentle *** HEP and reported understanding. Prior to current episode, patient was independent. Would benefit from skilled PT services *** x/week for *** weeks to address aforementioned impairments in order to optimize level of function.     OBJECTIVE IMPAIRMENTS: {opptimpairments:25111}.   ACTIVITY LIMITATIONS: {activitylimitations:27494}  PARTICIPATION LIMITATIONS: {participationrestrictions:25113}  PERSONAL FACTORS: {Personal factors:25162} are also affecting patient's functional outcome.   REHAB POTENTIAL: {rehabpotential:25112}  CLINICAL DECISION MAKING: {clinical decision making:25114}  EVALUATION COMPLEXITY: {Evaluation complexity:25115}  PLAN:  PT FREQUENCY: {rehab frequency:25116}  PT DURATION: {rehab duration:25117}  PLANNED INTERVENTIONS: {rehab planned interventions:25118::"97110-Therapeutic exercises","97530- Therapeutic [id number]- Neuromuscular re-education","97535- Self FAOZ","30865- Manual therapy"}  PLAN FOR NEXT SESSION: ***   Tyrone Sage, PT 04/15/2023, 7:48 AM

## 2023-04-16 ENCOUNTER — Ambulatory Visit: Payer: Medicare Other | Admitting: Physical Therapy

## 2023-04-17 DIAGNOSIS — E1129 Type 2 diabetes mellitus with other diabetic kidney complication: Secondary | ICD-10-CM | POA: Diagnosis not present

## 2023-04-17 DIAGNOSIS — Z8673 Personal history of transient ischemic attack (TIA), and cerebral infarction without residual deficits: Secondary | ICD-10-CM | POA: Diagnosis not present

## 2023-04-17 DIAGNOSIS — C61 Malignant neoplasm of prostate: Secondary | ICD-10-CM | POA: Diagnosis not present

## 2023-04-17 DIAGNOSIS — N138 Other obstructive and reflux uropathy: Secondary | ICD-10-CM | POA: Diagnosis not present

## 2023-04-17 DIAGNOSIS — I272 Pulmonary hypertension, unspecified: Secondary | ICD-10-CM | POA: Diagnosis not present

## 2023-04-17 DIAGNOSIS — M8580 Other specified disorders of bone density and structure, unspecified site: Secondary | ICD-10-CM | POA: Diagnosis not present

## 2023-04-17 DIAGNOSIS — F329 Major depressive disorder, single episode, unspecified: Secondary | ICD-10-CM | POA: Diagnosis not present

## 2023-04-17 DIAGNOSIS — I10 Essential (primary) hypertension: Secondary | ICD-10-CM | POA: Diagnosis not present

## 2023-04-17 DIAGNOSIS — D630 Anemia in neoplastic disease: Secondary | ICD-10-CM | POA: Diagnosis not present

## 2023-04-17 DIAGNOSIS — R350 Frequency of micturition: Secondary | ICD-10-CM | POA: Diagnosis not present

## 2023-04-17 DIAGNOSIS — M5412 Radiculopathy, cervical region: Secondary | ICD-10-CM | POA: Diagnosis not present

## 2023-04-17 DIAGNOSIS — I6529 Occlusion and stenosis of unspecified carotid artery: Secondary | ICD-10-CM | POA: Diagnosis not present

## 2023-04-17 DIAGNOSIS — M4802 Spinal stenosis, cervical region: Secondary | ICD-10-CM | POA: Diagnosis not present

## 2023-04-17 DIAGNOSIS — I872 Venous insufficiency (chronic) (peripheral): Secondary | ICD-10-CM | POA: Diagnosis not present

## 2023-04-17 DIAGNOSIS — E669 Obesity, unspecified: Secondary | ICD-10-CM | POA: Diagnosis not present

## 2023-04-17 DIAGNOSIS — E1165 Type 2 diabetes mellitus with hyperglycemia: Secondary | ICD-10-CM | POA: Diagnosis not present

## 2023-04-17 DIAGNOSIS — Z6830 Body mass index (BMI) 30.0-30.9, adult: Secondary | ICD-10-CM | POA: Diagnosis not present

## 2023-04-17 DIAGNOSIS — E785 Hyperlipidemia, unspecified: Secondary | ICD-10-CM | POA: Diagnosis not present

## 2023-04-17 DIAGNOSIS — N401 Enlarged prostate with lower urinary tract symptoms: Secondary | ICD-10-CM | POA: Diagnosis not present

## 2023-04-17 DIAGNOSIS — Z7982 Long term (current) use of aspirin: Secondary | ICD-10-CM | POA: Diagnosis not present

## 2023-04-17 DIAGNOSIS — G4733 Obstructive sleep apnea (adult) (pediatric): Secondary | ICD-10-CM | POA: Diagnosis not present

## 2023-04-17 DIAGNOSIS — I700 Atherosclerosis of aorta: Secondary | ICD-10-CM | POA: Diagnosis not present

## 2023-04-17 DIAGNOSIS — G4700 Insomnia, unspecified: Secondary | ICD-10-CM | POA: Diagnosis not present

## 2023-04-17 DIAGNOSIS — E1151 Type 2 diabetes mellitus with diabetic peripheral angiopathy without gangrene: Secondary | ICD-10-CM | POA: Diagnosis not present

## 2023-04-17 DIAGNOSIS — Z794 Long term (current) use of insulin: Secondary | ICD-10-CM | POA: Diagnosis not present

## 2023-04-21 DIAGNOSIS — E1129 Type 2 diabetes mellitus with other diabetic kidney complication: Secondary | ICD-10-CM | POA: Diagnosis not present

## 2023-04-21 DIAGNOSIS — Z8673 Personal history of transient ischemic attack (TIA), and cerebral infarction without residual deficits: Secondary | ICD-10-CM | POA: Diagnosis not present

## 2023-04-21 DIAGNOSIS — E1165 Type 2 diabetes mellitus with hyperglycemia: Secondary | ICD-10-CM | POA: Diagnosis not present

## 2023-04-21 DIAGNOSIS — Z794 Long term (current) use of insulin: Secondary | ICD-10-CM | POA: Diagnosis not present

## 2023-04-21 DIAGNOSIS — E785 Hyperlipidemia, unspecified: Secondary | ICD-10-CM | POA: Diagnosis not present

## 2023-04-21 DIAGNOSIS — I872 Venous insufficiency (chronic) (peripheral): Secondary | ICD-10-CM | POA: Diagnosis not present

## 2023-04-21 DIAGNOSIS — I700 Atherosclerosis of aorta: Secondary | ICD-10-CM | POA: Diagnosis not present

## 2023-04-21 DIAGNOSIS — I10 Essential (primary) hypertension: Secondary | ICD-10-CM | POA: Diagnosis not present

## 2023-04-21 DIAGNOSIS — E1151 Type 2 diabetes mellitus with diabetic peripheral angiopathy without gangrene: Secondary | ICD-10-CM | POA: Diagnosis not present

## 2023-04-23 DIAGNOSIS — E1165 Type 2 diabetes mellitus with hyperglycemia: Secondary | ICD-10-CM | POA: Diagnosis not present

## 2023-04-23 DIAGNOSIS — E1151 Type 2 diabetes mellitus with diabetic peripheral angiopathy without gangrene: Secondary | ICD-10-CM | POA: Diagnosis not present

## 2023-04-23 DIAGNOSIS — Z794 Long term (current) use of insulin: Secondary | ICD-10-CM | POA: Diagnosis not present

## 2023-04-23 DIAGNOSIS — E1129 Type 2 diabetes mellitus with other diabetic kidney complication: Secondary | ICD-10-CM | POA: Diagnosis not present

## 2023-04-23 DIAGNOSIS — I872 Venous insufficiency (chronic) (peripheral): Secondary | ICD-10-CM | POA: Diagnosis not present

## 2023-04-23 DIAGNOSIS — I700 Atherosclerosis of aorta: Secondary | ICD-10-CM | POA: Diagnosis not present

## 2023-04-24 NOTE — Progress Notes (Signed)
[facility] Summary Report / Loop Recorder 

## 2023-04-27 DIAGNOSIS — E1165 Type 2 diabetes mellitus with hyperglycemia: Secondary | ICD-10-CM | POA: Diagnosis not present

## 2023-04-27 DIAGNOSIS — Z794 Long term (current) use of insulin: Secondary | ICD-10-CM | POA: Diagnosis not present

## 2023-04-27 DIAGNOSIS — I872 Venous insufficiency (chronic) (peripheral): Secondary | ICD-10-CM | POA: Diagnosis not present

## 2023-04-27 DIAGNOSIS — E1151 Type 2 diabetes mellitus with diabetic peripheral angiopathy without gangrene: Secondary | ICD-10-CM | POA: Diagnosis not present

## 2023-04-27 DIAGNOSIS — I700 Atherosclerosis of aorta: Secondary | ICD-10-CM | POA: Diagnosis not present

## 2023-04-27 DIAGNOSIS — E1129 Type 2 diabetes mellitus with other diabetic kidney complication: Secondary | ICD-10-CM | POA: Diagnosis not present

## 2023-04-28 ENCOUNTER — Other Ambulatory Visit (HOSPITAL_COMMUNITY): Payer: Self-pay

## 2023-04-28 DIAGNOSIS — I872 Venous insufficiency (chronic) (peripheral): Secondary | ICD-10-CM | POA: Diagnosis not present

## 2023-04-28 DIAGNOSIS — Z794 Long term (current) use of insulin: Secondary | ICD-10-CM | POA: Diagnosis not present

## 2023-04-28 DIAGNOSIS — I700 Atherosclerosis of aorta: Secondary | ICD-10-CM | POA: Diagnosis not present

## 2023-04-28 DIAGNOSIS — E1151 Type 2 diabetes mellitus with diabetic peripheral angiopathy without gangrene: Secondary | ICD-10-CM | POA: Diagnosis not present

## 2023-04-28 DIAGNOSIS — E1165 Type 2 diabetes mellitus with hyperglycemia: Secondary | ICD-10-CM | POA: Diagnosis not present

## 2023-04-28 DIAGNOSIS — E1129 Type 2 diabetes mellitus with other diabetic kidney complication: Secondary | ICD-10-CM | POA: Diagnosis not present

## 2023-04-29 DIAGNOSIS — Z794 Long term (current) use of insulin: Secondary | ICD-10-CM | POA: Diagnosis not present

## 2023-04-29 DIAGNOSIS — E1129 Type 2 diabetes mellitus with other diabetic kidney complication: Secondary | ICD-10-CM | POA: Diagnosis not present

## 2023-04-29 DIAGNOSIS — I700 Atherosclerosis of aorta: Secondary | ICD-10-CM | POA: Diagnosis not present

## 2023-04-29 DIAGNOSIS — E1151 Type 2 diabetes mellitus with diabetic peripheral angiopathy without gangrene: Secondary | ICD-10-CM | POA: Diagnosis not present

## 2023-04-29 DIAGNOSIS — E1165 Type 2 diabetes mellitus with hyperglycemia: Secondary | ICD-10-CM | POA: Diagnosis not present

## 2023-04-29 DIAGNOSIS — I872 Venous insufficiency (chronic) (peripheral): Secondary | ICD-10-CM | POA: Diagnosis not present

## 2023-05-01 ENCOUNTER — Inpatient Hospital Stay: Payer: Medicare Other | Attending: Hematology | Admitting: Hematology

## 2023-05-01 ENCOUNTER — Inpatient Hospital Stay: Payer: Medicare Other

## 2023-05-01 VITALS — BP 91/52 | HR 69 | Temp 97.2°F | Resp 18 | Wt 196.8 lb

## 2023-05-01 DIAGNOSIS — Z8673 Personal history of transient ischemic attack (TIA), and cerebral infarction without residual deficits: Secondary | ICD-10-CM | POA: Insufficient documentation

## 2023-05-01 DIAGNOSIS — D6851 Activated protein C resistance: Secondary | ICD-10-CM | POA: Diagnosis not present

## 2023-05-01 DIAGNOSIS — E785 Hyperlipidemia, unspecified: Secondary | ICD-10-CM | POA: Diagnosis not present

## 2023-05-01 DIAGNOSIS — Z7902 Long term (current) use of antithrombotics/antiplatelets: Secondary | ICD-10-CM | POA: Insufficient documentation

## 2023-05-01 DIAGNOSIS — D6852 Prothrombin gene mutation: Secondary | ICD-10-CM | POA: Insufficient documentation

## 2023-05-01 DIAGNOSIS — D6859 Other primary thrombophilia: Secondary | ICD-10-CM | POA: Insufficient documentation

## 2023-05-01 DIAGNOSIS — G4733 Obstructive sleep apnea (adult) (pediatric): Secondary | ICD-10-CM | POA: Diagnosis not present

## 2023-05-01 DIAGNOSIS — Z860101 Personal history of adenomatous and serrated colon polyps: Secondary | ICD-10-CM | POA: Insufficient documentation

## 2023-05-01 DIAGNOSIS — Z8546 Personal history of malignant neoplasm of prostate: Secondary | ICD-10-CM | POA: Diagnosis not present

## 2023-05-01 DIAGNOSIS — Z7982 Long term (current) use of aspirin: Secondary | ICD-10-CM | POA: Diagnosis not present

## 2023-05-01 DIAGNOSIS — I10 Essential (primary) hypertension: Secondary | ICD-10-CM | POA: Diagnosis not present

## 2023-05-01 DIAGNOSIS — I639 Cerebral infarction, unspecified: Secondary | ICD-10-CM

## 2023-05-01 DIAGNOSIS — R531 Weakness: Secondary | ICD-10-CM | POA: Insufficient documentation

## 2023-05-01 DIAGNOSIS — E119 Type 2 diabetes mellitus without complications: Secondary | ICD-10-CM | POA: Insufficient documentation

## 2023-05-01 LAB — CMP (CANCER CENTER ONLY)
ALT: 22 U/L (ref 0–44)
AST: 18 U/L (ref 15–41)
Albumin: 4 g/dL (ref 3.5–5.0)
Alkaline Phosphatase: 53 U/L (ref 38–126)
Anion gap: 9 (ref 5–15)
BUN: 17 mg/dL (ref 8–23)
CO2: 26 mmol/L (ref 22–32)
Calcium: 9.7 mg/dL (ref 8.9–10.3)
Chloride: 100 mmol/L (ref 98–111)
Creatinine: 0.94 mg/dL (ref 0.61–1.24)
GFR, Estimated: >60 mL/min (ref >60)
Glucose, Bld: 107 mg/dL — ABNORMAL HIGH (ref 70–99)
Potassium: 3.8 mmol/L (ref 3.5–5.1)
Sodium: 135 mmol/L (ref 135–145)
Total Bilirubin: 0.9 mg/dL (ref <1.2)
Total Protein: 6.4 g/dL — ABNORMAL LOW (ref 6.5–8.1)

## 2023-05-01 LAB — CBC WITH DIFFERENTIAL (CANCER CENTER ONLY)
Abs Immature Granulocytes: 0.03 10*3/uL (ref 0.00–0.07)
Basophils Absolute: 0 10*3/uL (ref 0.0–0.1)
Basophils Relative: 0 %
Eosinophils Absolute: 0.2 10*3/uL (ref 0.0–0.5)
Eosinophils Relative: 2 %
HCT: 34.9 % — ABNORMAL LOW (ref 39.0–52.0)
Hemoglobin: 12.3 g/dL — ABNORMAL LOW (ref 13.0–17.0)
Immature Granulocytes: 0 %
Lymphocytes Relative: 14 %
Lymphs Abs: 1.6 10*3/uL (ref 0.7–4.0)
MCH: 30.8 pg (ref 26.0–34.0)
MCHC: 35.2 g/dL (ref 30.0–36.0)
MCV: 87.3 fL (ref 80.0–100.0)
Monocytes Absolute: 1.2 10*3/uL — ABNORMAL HIGH (ref 0.1–1.0)
Monocytes Relative: 11 %
Neutro Abs: 8.2 10*3/uL — ABNORMAL HIGH (ref 1.7–7.7)
Neutrophils Relative %: 73 %
Platelet Count: 249 10*3/uL (ref 150–400)
RBC: 4 MIL/uL — ABNORMAL LOW (ref 4.22–5.81)
RDW: 13.5 % (ref 11.5–15.5)
WBC Count: 11.3 10*3/uL — ABNORMAL HIGH (ref 4.0–10.5)
nRBC: 0 % (ref 0.0–0.2)

## 2023-05-01 LAB — ANTITHROMBIN III: AntiThromb III Func: 109 % (ref 75–120)

## 2023-05-01 NOTE — Progress Notes (Signed)
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  HISTORY OF PRESENTING ILLNESS:   Kenneth [name] is a wonderful 72 y.o. male who has been referred to [place] by Dr. [name] for evaluation and management of low Protein-S levels with recurrent CVA. He also notes that he was subsequently noted to have Compound heterozygous state for Factor V Leiden mutation and Prothrombin gene mutation.  Labs from 04/13/2023 showed elevated Monocytes of 16.5%, lab range from 4.6 to 12.4%, and elevated Monocytes absolute of 1.4, lab range from 0.1-0.9. Protein-S total in the normal range of 67%, lab range from 60-150%, Protein-S free level low at 53%, lab range from 61-136%.  Patient is accompanied by his wife and his nurse during this visit. Patient notes that his first stroke was in May 2024. He denies any past medical stroke of stroke before May. His symptom in May was right leg weakness, which caused him to fall. He was prescribed Plavix and he denies being on acid suppresant while he was on palvix. His symptoms improved after his first stroke in May.   Neurologist had an Brain MRI in May, which showed Acute bilateral ACA territory infarcts (left-greater-than-right) involving the parasagittal aspects of the bilateral parietal lobes.   Patient notes that before his first stroke, his blood pressure was around 125-130. Currently, his blood pressure after his stroke is around 110-115.   Patient denies any family history of blood disorder or early heart disease.   Patient is currently on Asprin and Brilinta.  Patient started [facility] around one  year ago.    Patient notes that his blood pressure, diabetes, and hyperlipidemia are well-controlled with medications.   Patient's wife notes that his first cousin on his mother side is having blood clotting issues. They are unsure of what kind of blood clot. He has one sister who has never had blood clots.   He notes his father was taking Coumadin. Patient notes that his father was between 49-5 years old when he first started Coumadin.   Patient had second stroke in November. Symptoms that took him to the hospital in November was bilateral leg weakness and unable to walk. After his second stroke, patient's energy levels and weakness, mainly in legs, have worsened.   He has retired from work due to work stress. He worked as an IT trainer.   Patient notes he recently had mutation testing at Dr. [name], which showed patient has heterozygous Prothrombin gene mutation and Factor-5-leiden mutation. We don't have this results and will contact Dr. Waynard Edwards to confirm the results.   During this visit, he complains of neck pain, lethargy, and bilateral leg weakness. He notes he has lost 20 lbs in 3-4 weeks. He denies any new infection issues, fever, chills, night sweats, back pain, or leg swelling.   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   History of colon polyps - adenomas 04/03/2010   Hyperlipidemia  Hypertension    Iron deficiency anemia, unspecified    OSA (obstructive sleep apnea)    refusing CPAP   Pneumonia 03/2022   w/effusion   Prostate cancer (HCC)    Stroke [facility])     SURGICAL HISTORY: Past Surgical History:  Procedure Laterality Date   ANTERIOR CERVICAL DECOMP/DISCECTOMY FUSION N/A 10/01/2012   Procedure: ANTERIOR CERVICAL DECOMPRESSION/DISCECTOMY FUSION 2 LEVELS;  Surgeon:  Tia Alert, MD;  Location: [facility] NEURO ORS;  Service: Neurosurgery;  Laterality: N/A;  Cervical five-six,Cervical six-seven   COLONOSCOPY  09/30/2010, 03/01/2014   diverticulosis, small internal hemorrhoids   COLONOSCOPY W/ POLYPECTOMY  04/03/2010   4 polyps 8-82mm, worst TV adenoma with high-grade dysplasia   LOOP RECORDER INSERTION N/A 10/10/2022   Procedure: LOOP RECORDER INSERTION;  Surgeon: Marinus Maw, MD;  Location: [facility] INVASIVE CV LAB;  Service: Cardiovascular;  Laterality: N/A;   NO PAST SURGERIES     POLYPECTOMY     POSTERIOR CERVICAL FUSION/FORAMINOTOMY Left 10/20/2012   Procedure: C/5-6,C/6-7 Lami/Multi level,POSTERIOR CERVICAL FUSION/FORAMINOTOMY C/4-7;  Surgeon: Tia Alert, MD;  Location: [facility] NEURO ORS;  Service: Neurosurgery;  Laterality: Left;  Cervical Five-Six, Cervical Six-Seven Laminectomies, Posterior Cervical Fusion/Foraminotomies Cervical Four through Seven.    TONSILLECTOMY      SOCIAL HISTORY: Social History   Socioeconomic History   Marital status: Married    Spouse name: Not on file   Number of children: Not on file   Years of education: Not on file   Highest education level: Not on file  Occupational History   Not on file  Tobacco Use   Smoking status: Former    Current packs/day: 0.00    Average packs/day: 0.3 packs/day for 2.0 years (0.5 ttl pk-yrs)    Types: Cigarettes    Start date: 09/23/1967    Quit date: 09/22/1969    Years since quitting: 53.6   Smokeless tobacco: Never  Vaping Use   Vaping status: Never Used  Substance and Sexual Activity   Alcohol use: No   Drug use: No   Sexual activity: Yes  Other Topics Concern   Not on file  Social History Narrative   Not on file   Social Drivers of Health   Financial Resource Strain: Not on file  Food Insecurity: No Food Insecurity (04/13/2023)   Hunger Vital Sign    Worried About Running Out of Food in the Last Year: Never true    Ran Out of Food in the Last Year: Never true   Transportation Needs: No Transportation Needs (04/13/2023)   PRAPARE - Administrator, Civil Service (Medical): No    Lack of Transportation (Non-Medical): No  Physical Activity: Not on file  Stress: Not on file  Social Connections: Not on file  Intimate Partner Violence: Not At Risk (04/13/2023)   Humiliation, Afraid, Rape, and Kick questionnaire    Fear of Current or Ex-Partner: No    Emotionally Abused: No    Physically Abused: No    Sexually Abused: No    FAMILY HISTORY: Family History  Problem Relation Age of Onset   Diabetes Mother    Stomach cancer Neg Hx    Rectal cancer Neg Hx    Pancreatic cancer Neg Hx    Colon cancer Neg Hx    Colon polyps Neg Hx    Esophageal cancer Neg Hx    Prostate cancer Neg Hx    Breast cancer Neg Hx     ALLERGIES:  is allergic to jardiance [empagliflozin],  fluoxetine, penicillins, and silodosin.  MEDICATIONS:  Current Outpatient Medications  Medication Sig Dispense Refill   aspirin 81 MG chewable tablet Chew 1 tablet (81 mg total) by mouth daily. 30 tablet 1   atorvastatin (LIPITOR) 40 MG tablet Take 1 tablet (40 mg total) by mouth daily. (Patient taking differently: Take 40 mg by mouth at bedtime.) 30 tablet 0   azithromycin (ZITHROMAX) 250 MG tablet Take 1 tablet (250 mg total) by mouth daily. Take first 2 tablets together, then 1 every day until finished. 6 tablet 0   benazepril (LOTENSIN) 40 MG tablet Take 20 mg by mouth in the morning and at bedtime.     chlorthalidone (HYGROTON) 25 MG tablet Take 25 mg by mouth daily.     Cholecalciferol (VITAMIN D3) 1000 units CAPS Take 1,000 Units by mouth in the morning and at bedtime.     [START ON 05/08/2023] clopidogrel (PLAVIX) 75 MG tablet Take 1 tablet (75 mg total) by mouth daily.     Continuous Glucose Sensor (FREESTYLE LIBRE 14 DAY SENSOR) MISC Inject 1 Device into the skin every 14 (fourteen) days.     ezetimibe (ZETIA) 10 MG tablet Take 10 mg by mouth daily.     metFORMIN  (GLUCOPHAGE-XR) 500 MG 24 hr tablet Take 500 mg by mouth in the morning and at bedtime.     MOUNJARO 5 MG/0.5ML Pen Inject 5 mg into the skin every Monday.     ticagrelor (BRILINTA) 90 MG TABS tablet Take 1 tablet (90 mg total) by mouth 2 (two) times daily. 60 tablet 0   TOUJEO SOLOSTAR 300 UNIT/ML Solostar Pen Inject 40 Units into the skin in the morning.     No current facility-administered medications for this visit.    REVIEW OF SYSTEMS:    10 Point review of Systems was done is negative except as noted above.  PHYSICAL EXAMINATION: ECOG PERFORMANCE STATUS: 2 - Symptomatic, <50% confined to bed  . Vitals:   05/01/23 1000  BP: (!) 91/52  Pulse: 69  Resp: 18  Temp: (!) 97.2 F (36.2 C)  SpO2: 100%   Filed Weights   05/01/23 1000  Weight: 196 lb 12.8 oz (89.3 kg)   .Body mass index is 27.45 kg/m.  GENERAL:alert, in no acute distress and comfortable SKIN: no acute rashes, no significant lesions EYES: conjunctiva are pink and non-injected, sclera anicteric OROPHARYNX: MMM, no exudates, no oropharyngeal erythema or ulceration NECK: supple, no JVD LYMPH:  no palpable lymphadenopathy in the cervical, axillary or inguinal regions LUNGS: clear to auscultation b/l with normal respiratory effort HEART: regular rate & rhythm ABDOMEN:  normoactive bowel sounds , non tender, not distended. Extremity: no pedal edema PSYCH: alert & oriented x 3 with fluent speech NEURO: no focal motor/sensory deficits  LABORATORY DATA:  I have reviewed the data as listed  .    Latest Ref Rng & Units 04/11/2023   11:13 AM 04/08/2023    5:55 AM 04/07/2023    4:58 AM  CBC  WBC 4.0 - 10.5 K/uL 8.5  9.4  8.6   Hemoglobin 13.0 - 17.0 g/dL 13.0  86.5  78.4   Hematocrit 39.0 - 52.0 % 36.2  36.2  36.0   Platelets 150 - 400 K/uL 153  168  171     .    Latest Ref Rng & Units 04/11/2023   11:13 AM 04/07/2023    4:58 AM 04/06/2023    5:50 PM  CMP  Glucose 70 - 99 mg/dL 696  108  98   BUN 8  - 23 mg/dL 16  12  15    Creatinine 0.61 - 1.24 mg/dL 4.78  2.95  6.21   Sodium 135 - 145 mmol/L 134  138  140   Potassium 3.5 - 5.1 mmol/L 3.2  3.7  3.4   Chloride 98 - 111 mmol/L 99  101  98   CO2 22 - 32 mmol/L 24  28    Calcium 8.9 - 10.3 mg/dL 9.1  9.3    Total Protein 6.5 - 8.1 g/dL 6.0  5.9    Total Bilirubin <1.2 mg/dL 1.2  1.2    Alkaline Phos 38 - 126 U/L 43  38    AST 15 - 41 U/L 20  17    ALT 0 - 44 U/L 17  16       RADIOGRAPHIC STUDIES: I have personally reviewed the radiological images as listed and agreed with the findings in the report. DG Chest 2 View Result Date: 04/11/2023 CLINICAL DATA:  Weakness and cough EXAM: CHEST - 2 VIEW COMPARISON:  Chest radiograph dated 07/23/2017 FINDINGS: Implanted loop recorder within the left anterior chest wall. Normal lung volumes. Unchanged hazy and patchy right lower lung opacity adjacent to the pleural thickening. Unchanged thickening of the right lateral pleura. The heart size and mediastinal contours are within normal limits. Cervical spinal fixation hardware appears intact. IMPRESSION: Unchanged hazy and patchy right lower lung opacity adjacent to the pleural thickening, which may represent atelectasis or scarring. No new focal consolidations. Electronically Signed   By: Agustin Cree M.D.   On: 04/11/2023 11:51   ECHOCARDIOGRAM COMPLETE Result Date: 04/07/2023    ECHOCARDIOGRAM REPORT   Patient Name:   Kenneth [name] Date of Exam: 04/07/2023 Medical Rec #:  [id number]        Height:       71.0 in Accession #:    [id number]       Weight:       217.0 lb Date of Birth:  Jun 27, 1950        BSA:          2.183 m Patient Age:    72 years         BP:           128/74 mmHg Patient Gender: M                HR:           67 bpm. Exam Location:  Inpatient Procedure: 2D Echo and Limited Color Doppler Indications:    I63.9 Strike  History:        Patient has prior history of Echocardiogram examinations, most                 recent 10/09/2022. CHF, COPD  and Stroke; Risk                 Factors:Hypertension, Diabetes and Sleep Apnea.  Sonographer:    Dondra Prader RVT RCS Referring Phys: [facility]  Sonographer Comments: Technically challenging study due to limited acoustic windows and Technically difficult study due to poor echo windows. Image acquisition challenging due to uncooperative patient. Patient refused to finish with ECHO. He said it is uncomfortable. He would not lie still which made the test difficult to perform IMPRESSIONS  1. Left ventricular ejection fraction, by estimation, is 60 to 65%. The left ventricle has normal function. Left ventricular endocardial border not optimally defined to evaluate regional wall motion.  Left ventricular diastolic function could not be evaluated.  2. Right ventricular systolic function is mildly reduced. The right ventricular size is mildly enlarged.  3. The mitral valve is normal in structure. Trivial mitral valve regurgitation. No evidence of mitral stenosis.  4. The aortic valve is calcified. Aortic valve regurgitation is not visualized. Aortic valve sclerosis/calcification is present, without any evidence of aortic stenosis. Conclusion(s)/Recommendation(s): Very limited echo with poor images. LV is grossly normal. RV appears mildly dilated and hypokinetic but is imaged off axis and is not well seen. FINDINGS  Left Ventricle: Left ventricular ejection fraction, by estimation, is 60 to 65%. The left ventricle has normal function. Left ventricular endocardial border not optimally defined to evaluate regional wall motion. The left ventricular internal cavity size was normal in size. There is no left ventricular hypertrophy. Left ventricular diastolic function could not be evaluated. Right Ventricle: The right ventricular size is mildly enlarged. No increase in right ventricular wall thickness. Right ventricular systolic function is mildly reduced. Left Atrium: Left atrial size was normal in size. Right Atrium: Right  atrial size was normal in size. Pericardium: There is no evidence of pericardial effusion. Mitral Valve: The mitral valve is normal in structure. Trivial mitral valve regurgitation. No evidence of mitral valve stenosis. Tricuspid Valve: The tricuspid valve is grossly normal. Tricuspid valve regurgitation is trivial. No evidence of tricuspid stenosis. Aortic Valve: The aortic valve is calcified. Aortic valve regurgitation is not visualized. Aortic valve sclerosis/calcification is present, without any evidence of aortic stenosis. Pulmonic Valve: The pulmonic valve was normal in structure. Pulmonic valve regurgitation is not visualized. No evidence of pulmonic stenosis. Aorta: The aortic arch was not well visualized and the ascending aorta was not well visualized. IAS/Shunts: No atrial level shunt detected by color flow Doppler.  LEFT VENTRICLE PLAX 2D LVIDd:         4.60 cm LVIDs:         3.20 cm LV PW:         1.00 cm LV IVS:        0.80 cm LVOT diam:     2.75 cm LVOT Area:     5.94 cm  LEFT ATRIUM         Index LA diam:    3.80 cm 1.74 cm/m   SHUNTS Systemic Diam: 2.75 cm Arvilla Meres MD Electronically signed by Arvilla Meres MD Signature Date/Time: 04/07/2023/11:05:05 AM    Final    CT ANGIO HEAD NECK W WO CM Result Date: 04/06/2023 CLINICAL DATA:  Stroke/TIA, determine embolic source. EXAM: CT ANGIOGRAPHY HEAD AND NECK WITH AND WITHOUT CONTRAST TECHNIQUE: Multidetector CT imaging of the head and neck was performed using the standard protocol during bolus administration of intravenous contrast. Multiplanar CT image reconstructions and MIPs were obtained to evaluate the vascular anatomy. Carotid stenosis measurements (when applicable) are obtained utilizing NASCET criteria, using the distal internal carotid diameter as the denominator. RADIATION DOSE REDUCTION: This exam was performed according to the departmental dose-optimization program which includes automated exposure control, adjustment of the mA  and/or kV according to patient size and/or use of iterative reconstruction technique. CONTRAST:  75mL OMNIPAQUE IOHEXOL 350 MG/ML SOLN COMPARISON:  CTA head/neck Oct 08, 2022. FINDINGS: CT HEAD FINDINGS Brain: No evidence of acute large vascular territory infarction, hemorrhage, hydrocephalus, extra-axial collection or mass lesion/mass effect. Vascular: See below. Skull: No acute fracture. Sinuses/Orbits: Clear sinuses.  No acute orbital findings. Review of the MIP images confirms the above findings CTA NECK FINDINGS Aortic arch:  Great vessel origins are patent without significant stenosis. Right carotid system: No evidence of dissection, stenosis (50% or greater), or occlusion. Left carotid system: No evidence of dissection, stenosis (50% or greater), or occlusion. Vertebral arteries: Mild narrowing of the right vertebral artery proximally. No evidence of dissection, stenosis (50% or greater), or occlusion. Skeleton: No acute abnormality on limited assessment. Other neck: No acute abnormality on limited assessment. Upper chest: Visualized lung apices are clear. Review of the MIP images confirms the above findings CTA HEAD FINDINGS Anterior circulation: Bilateral intracranial ICAs, MCAs, and ACAs are patent without proximal hemodynamically significant stenosis. Similar diminutive distal right ACA. Posterior circulation: Bilateral intradural vertebral arteries, basilar artery and bilateral posterior cerebral arteries are patent without proximal hemodynamically significant stenosis. Venous sinuses: As permitted by contrast timing, patent. Review of the MIP images confirms the above findings IMPRESSION: No emergent large vessel occlusion or proximal high-grade stenosis. Similar diminutive distal right ACA. Electronically Signed   By: Feliberto Harts M.D.   On: 04/06/2023 17:01   MR Brain Wo Contrast (neuro protocol) Result Date: 04/06/2023 CLINICAL DATA:  Headache, neuro deficit. Dizziness. Unsteady gait. History  of stroke. EXAM: MRI HEAD WITHOUT CONTRAST TECHNIQUE: Multiplanar, multiecho pulse sequences of the brain and surrounding structures were obtained without intravenous contrast. COMPARISON:  Head MRI 10/08/2022 FINDINGS: Image quality is degraded by motion artifact and reduced signal to noise primarily anteriorly. Brain: There are small acute to subacute cortical and subcortical infarcts in the left frontal and parietal lobes, and there is also a small acute to early subacute infarct involving the periventricular white matter in the posterior right temporal lobe. There is mild encephalomalacia medially in the left frontoparietal region corresponding to an area of acute infarction on the prior MRI. T2 hyperintensities elsewhere in the cerebral white matter are similar to the prior study and nonspecific but compatible with mild chronic small vessel ischemic disease. A small chronic infarct is again seen involving the right frontal periventricular white matter, and there may be 1 or 2 tiny chronic right cerebellar infarcts. There is mild cerebral atrophy. No intracranial hemorrhage, mass, midline shift, or extra-axial fluid collection is identified. Vascular: Major intracranial vascular flow voids are preserved. Skull and upper cervical spine: Unremarkable bone marrow signal. Sinuses/Orbits: Unremarkable orbits. Mild mucosal thickening in the ethmoid sinuses. Clear mastoid air cells. Other: None. IMPRESSION: 1. Small acute to subacute infarcts in the left frontal and parietal lobes and right temporal white matter. 2. Mild chronic small vessel ischemic disease. Electronically Signed   By: Sebastian Ache M.D.   On: 04/06/2023 16:14    ASSESSMENT & PLAN:   Low Protein-S levels.  Might be positive for  heterozygous Prothrombin gene mutation and Factor-5-leidien mutation. - waiting for confirmation from Dr. Waynard Edwards. Patient had two strokes, one in [id number] and one in November 2024 this year.   PLAN: -Discussed with the  patient about his recurrent CVA in detail with the patient.  -Discussed the next plan is additional lab workup during this visit. Patient agrees.  -Discussed more information regarding Protein-S levels being borderline low and could suggest an acquired Protein S deficiency. -Patient notes he had mutation testing at Dr. [name], which showed patient has Prothrombin gene mutation and Factor-5-leidien mutation. We don't have this results and will contact Dr. Waynard Edwards to confirm the results.  -Educated the patient regarding Prothrombin gene mutation and Factor-5 leiden mutation. Discussed with the patient that these mutation can be a risk factor for CVA. -Discussed with the patient that  if patient does have Prothrombin gene mutation and Factor-5 leiden mutation, then we would recommend anti-coagulation.  -Recommend changing Brillinta to Eliquis and continuing ASA 81mg  po daily -- but since his related to CVA's its finally the neurologist decision..  -Answered all of patient's and his wife's questions.   . Orders Placed This Encounter  Procedures   CBC with Differential (Cancer Center Only)    Standing Status:   Future    Number of Occurrences:   1    Expiration Date:   04/30/2024   CMP (Cancer Center only)    Standing Status:   Future    Number of Occurrences:   1    Expected Date:   05/01/2023    Expiration Date:   04/30/2024   Antithrombin III    Standing Status:   Future    Number of Occurrences:   1    Expected Date:   05/01/2023    Expiration Date:   04/30/2024   Protein C activity    Standing Status:   Future    Number of Occurrences:   1    Expected Date:   05/01/2023    Expiration Date:   04/30/2024   Protein C, total    Standing Status:   Future    Number of Occurrences:   1    Expected Date:   05/01/2023    Expiration Date:   04/30/2024   Protein S activity    Standing Status:   Future    Number of Occurrences:   1    Expected Date:   05/01/2023    Expiration Date:    04/30/2024   Protein S, total    Standing Status:   Future    Number of Occurrences:   1    Expected Date:   05/01/2023    Expiration Date:   04/30/2024   Lupus anticoagulant panel    Standing Status:   Future    Number of Occurrences:   1    Expected Date:   05/01/2023    Expiration Date:   04/30/2024   Beta-2-glycoprotein i abs, IgG/M/A    Standing Status:   Future    Number of Occurrences:   1    Expected Date:   05/01/2023    Expiration Date:   04/30/2024   Homocysteine, serum    Standing Status:   Future    Number of Occurrences:   1    Expected Date:   05/01/2023    Expiration Date:   04/30/2024   Factor 5 leiden    Standing Status:   Future    Number of Occurrences:   1    Expected Date:   05/01/2023    Expiration Date:   04/30/2024   Prothrombin gene mutation    Standing Status:   Future    Number of Occurrences:   1    Expected Date:   05/01/2023    Expiration Date:   04/30/2024   Cardiolipin antibodies, IgG, IgM, IgA    Standing Status:   Future    Number of Occurrences:   1    Expected Date:   05/01/2023    Expiration Date:   04/30/2024   Multiple Myeloma Panel (SPEP&IFE w/QIG)    Standing Status:   Future    Number of Occurrences:   1    Expected Date:   05/01/2023    Expiration Date:   04/30/2024   Kappa/lambda light chains    Standing Status:  Future    Number of Occurrences:   1    Expected Date:   05/01/2023    Expiration Date:   04/30/2024    FOLLOW-UP: Labs today Phone visit with Dr Candise Che in about 2 weeks .The total time spent in the appointment was 60 minutes* .  All of the patient's questions were answered with apparent satisfaction. The patient knows to call the clinic with any problems, questions or concerns.   Wyvonnia Lora MD MS AAHIVMS [facility] Hematology/Oncology Physician [facility]  .*Total Encounter Time as defined by the Centers for Medicare and Medicaid Services includes, in addition to the face-to-face time of a  patient visit (documented in the note above) non-face-to-face time: obtaining and reviewing outside history, ordering and reviewing medications, tests or procedures, care coordination (communications with other health care professionals or caregivers) and documentation in the medical record.   05/01/2023 9:32 AM  I,Param Shah,acting as a scribe for Wyvonnia Lora, MD.,have documented all relevant documentation on the behalf of Wyvonnia Lora, MD,as directed by  Wyvonnia Lora, MD while in the presence of Wyvonnia Lora, MD.  .I have reviewed the above documentation for accuracy and completeness, and I agree with the above. Johney Maine MD   ADDENDUM Component     Latest Ref Rng 05/01/2023  WBC     4.0 - 10.5 K/uL 11.3 (H)   RBC     4.22 - 5.81 MIL/uL 4.00 (L)   Hemoglobin     13.0 - 17.0 g/dL 65.7 (L)   HCT     84.6 - 52.0 % 34.9 (L)   MCV     80.0 - 100.0 fL 87.3   MCH     26.0 - 34.0 pg 30.8   MCHC     30.0 - 36.0 g/dL 96.2   RDW     95.2 - 84.1 % 13.5   Platelets     150 - 400 K/uL 249   nRBC     0.0 - 0.2 % 0.0   Neutrophils     % 73   NEUT#     1.7 - 7.7 K/uL 8.2 (H)   Lymphocytes     % 14   Lymphs Abs     0.7 - 4.0 K/uL 1.6   Monocytes Relative     % 11   Monocyte #     0.1 - 1.0 K/uL 1.2 (H)   Eosinophil     % 2   Eosinophils Absolute     0.0 - 0.5 K/uL 0.2   Basophil     % 0   Basophils Absolute     0.0 - 0.1 K/uL 0.0   Immature Granulocytes     % 0   Abs Immature Granulocytes     0.00 - 0.07 K/uL 0.03   Sodium     135 - 145 mmol/L 135   Potassium     3.5 - 5.1 mmol/L 3.8   Chloride     98 - 111 mmol/L 100   CO2     22 - 32 mmol/L 26   Glucose     70 - 99 mg/dL 324 (H)   BUN     8 - 23 mg/dL 17   Creatinine     4.01 - 1.24 mg/dL 0.27   Calcium     8.9 - 10.3 mg/dL 9.7   Total Protein     6.5 - 8.1 g/dL 6.4 (L)   Albumin  3.5 - 5.0 g/dL 4.0   AST     15 - 41 U/L 18   ALT     0 - 44 U/L 22   Alkaline Phosphatase     38 - 126 U/L  53   Total Bilirubin     <1.2 mg/dL 0.9   GFR, Est Non African American     >60 mL/min >60   Anion gap     5 - 15  9   Kappa free light chain     3.3 - 19.4 mg/L 19.1   Lambda free light chains     5.7 - 26.3 mg/L 20.9   Kappa, lambda light chain ratio     0.26 - 1.65  0.91   Anticardiolipin Ab,IgG,Qn     0 - 14 GPL U/mL <9   Anticardiolipin Ab,IgM,Qn     0 - 12 MPL U/mL <9   Anticardiolipin Ab,IgA,Qn     0 - 11 APL U/mL <9   Beta-2 Glycoprotein I Ab, IgG     0 - 20 GPI IgG units <9   Beta-2-Glycoprotein I IgM     0 - 32 GPI IgM units <9   Beta-2-Glycoprotein I IgA     0 - 25 GPI IgA units <9   PTT Lupus Anticoagulant     0.0 - 43.5 sec 35.9   DRVVT     0.0 - 47.0 sec 37.1   Lupus Anticoag Interp Comment: (C)  Recommendations-F5LEID: Comment !   Reviewed By: Comment   Homocysteine     0.0 - 19.2 umol/L 18.3   Protein S, Total     60 - 150 % 57 (L)   Protein S-Functional     63 - 140 % 27 (L)   Protein C, Total     60 - 150 % 104   Protein C-Functional     73 - 180 % 125   Antithrombin Activity     75 - 120 % 109     Legend: (H) High (L) Low ! 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 This combination of findings could increase the risk of VTE as well as potentially arterial thrombosis. Plan -discussed results with patient. -recommend Therapeutic long term anticoagulation with a DOAC such as Eliquis or Xarelto in addition to ASA 81mg  po daily. Might consider changing Brillinta to a DOAC in light of these findings. Final decision will be upto PCP and patients Neurologist. -Also recommended patients siblings and children if any be tested for this conditions.  Johney Maine MD

## 2023-05-03 LAB — PROTEIN C, TOTAL: Protein C, Total: 104 % (ref 60–150)

## 2023-05-03 LAB — BETA-2-GLYCOPROTEIN I ABS, IGG/M/A
Beta-2 Glyco I IgG: <9 GPI IgG units (ref 0–20)
Beta-2-Glycoprotein I IgA: <9 GPI IgA units (ref 0–25)
Beta-2-Glycoprotein I IgM: <9 GPI IgM units (ref 0–32)

## 2023-05-03 LAB — CARDIOLIPIN ANTIBODIES, IGG, IGM, IGA
Anticardiolipin IgA: <9 [APL'U]/mL (ref 0–11)
Anticardiolipin IgG: <9 [GPL'U]/mL (ref 0–14)
Anticardiolipin IgM: <9 [MPL'U]/mL (ref 0–12)

## 2023-05-04 ENCOUNTER — Ambulatory Visit (INDEPENDENT_AMBULATORY_CARE_PROVIDER_SITE_OTHER): Payer: Medicare Other

## 2023-05-04 DIAGNOSIS — I639 Cerebral infarction, unspecified: Secondary | ICD-10-CM

## 2023-05-04 LAB — KAPPA/LAMBDA LIGHT CHAINS
Kappa free light chain: 19.1 mg/L (ref 3.3–19.4)
Kappa, lambda light chain ratio: 0.91 (ref 0.26–1.65)
Lambda free light chains: 20.9 mg/L (ref 5.7–26.3)

## 2023-05-04 LAB — PROTEIN S, TOTAL: Protein S Ag, Total: 57 % — ABNORMAL LOW (ref 60–150)

## 2023-05-04 LAB — LUPUS ANTICOAGULANT PANEL
DRVVT: 37.1 s (ref 0.0–47.0)
PTT Lupus Anticoagulant: 35.9 s (ref 0.0–43.5)

## 2023-05-04 LAB — PROTEIN S ACTIVITY: Protein S Activity: 27 % — ABNORMAL LOW (ref 63–140)

## 2023-05-04 LAB — HOMOCYSTEINE: Homocysteine: 18.3 umol/L (ref 0.0–19.2)

## 2023-05-04 LAB — PROTEIN C ACTIVITY: Protein C Activity: 125 % (ref 73–180)

## 2023-05-05 LAB — CUP PACEART REMOTE DEVICE CHECK: Date Time Interrogation Session: 20241222232524

## 2023-05-05 LAB — FACTOR 5 LEIDEN

## 2023-05-07 DIAGNOSIS — E1151 Type 2 diabetes mellitus with diabetic peripheral angiopathy without gangrene: Secondary | ICD-10-CM | POA: Diagnosis not present

## 2023-05-07 DIAGNOSIS — I872 Venous insufficiency (chronic) (peripheral): Secondary | ICD-10-CM | POA: Diagnosis not present

## 2023-05-07 DIAGNOSIS — Z794 Long term (current) use of insulin: Secondary | ICD-10-CM | POA: Diagnosis not present

## 2023-05-07 DIAGNOSIS — E1165 Type 2 diabetes mellitus with hyperglycemia: Secondary | ICD-10-CM | POA: Diagnosis not present

## 2023-05-07 DIAGNOSIS — E1129 Type 2 diabetes mellitus with other diabetic kidney complication: Secondary | ICD-10-CM | POA: Diagnosis not present

## 2023-05-07 DIAGNOSIS — I700 Atherosclerosis of aorta: Secondary | ICD-10-CM | POA: Diagnosis not present

## 2023-05-08 LAB — PROTHROMBIN GENE MUTATION

## 2023-05-11 DIAGNOSIS — E1165 Type 2 diabetes mellitus with hyperglycemia: Secondary | ICD-10-CM | POA: Diagnosis not present

## 2023-05-11 DIAGNOSIS — I872 Venous insufficiency (chronic) (peripheral): Secondary | ICD-10-CM | POA: Diagnosis not present

## 2023-05-11 DIAGNOSIS — E1129 Type 2 diabetes mellitus with other diabetic kidney complication: Secondary | ICD-10-CM | POA: Diagnosis not present

## 2023-05-11 DIAGNOSIS — Z794 Long term (current) use of insulin: Secondary | ICD-10-CM | POA: Diagnosis not present

## 2023-05-11 DIAGNOSIS — I700 Atherosclerosis of aorta: Secondary | ICD-10-CM | POA: Diagnosis not present

## 2023-05-11 DIAGNOSIS — E1151 Type 2 diabetes mellitus with diabetic peripheral angiopathy without gangrene: Secondary | ICD-10-CM | POA: Diagnosis not present

## 2023-05-12 DIAGNOSIS — I872 Venous insufficiency (chronic) (peripheral): Secondary | ICD-10-CM | POA: Diagnosis not present

## 2023-05-12 DIAGNOSIS — E1129 Type 2 diabetes mellitus with other diabetic kidney complication: Secondary | ICD-10-CM | POA: Diagnosis not present

## 2023-05-12 DIAGNOSIS — Z794 Long term (current) use of insulin: Secondary | ICD-10-CM | POA: Diagnosis not present

## 2023-05-12 DIAGNOSIS — I700 Atherosclerosis of aorta: Secondary | ICD-10-CM | POA: Diagnosis not present

## 2023-05-12 DIAGNOSIS — E1165 Type 2 diabetes mellitus with hyperglycemia: Secondary | ICD-10-CM | POA: Diagnosis not present

## 2023-05-12 DIAGNOSIS — E1151 Type 2 diabetes mellitus with diabetic peripheral angiopathy without gangrene: Secondary | ICD-10-CM | POA: Diagnosis not present

## 2023-05-13 DIAGNOSIS — E1129 Type 2 diabetes mellitus with other diabetic kidney complication: Secondary | ICD-10-CM | POA: Diagnosis not present

## 2023-05-13 DIAGNOSIS — Z794 Long term (current) use of insulin: Secondary | ICD-10-CM | POA: Diagnosis not present

## 2023-05-13 DIAGNOSIS — E1165 Type 2 diabetes mellitus with hyperglycemia: Secondary | ICD-10-CM | POA: Diagnosis not present

## 2023-05-13 DIAGNOSIS — E1151 Type 2 diabetes mellitus with diabetic peripheral angiopathy without gangrene: Secondary | ICD-10-CM | POA: Diagnosis not present

## 2023-05-13 DIAGNOSIS — I872 Venous insufficiency (chronic) (peripheral): Secondary | ICD-10-CM | POA: Diagnosis not present

## 2023-05-13 DIAGNOSIS — I700 Atherosclerosis of aorta: Secondary | ICD-10-CM | POA: Diagnosis not present

## 2023-05-14 DIAGNOSIS — E1151 Type 2 diabetes mellitus with diabetic peripheral angiopathy without gangrene: Secondary | ICD-10-CM | POA: Diagnosis not present

## 2023-05-14 DIAGNOSIS — M7918 Myalgia, other site: Secondary | ICD-10-CM | POA: Diagnosis not present

## 2023-05-14 DIAGNOSIS — R2689 Other abnormalities of gait and mobility: Secondary | ICD-10-CM | POA: Diagnosis not present

## 2023-05-14 DIAGNOSIS — I10 Essential (primary) hypertension: Secondary | ICD-10-CM | POA: Diagnosis not present

## 2023-05-14 DIAGNOSIS — E1129 Type 2 diabetes mellitus with other diabetic kidney complication: Secondary | ICD-10-CM | POA: Diagnosis not present

## 2023-05-14 DIAGNOSIS — I872 Venous insufficiency (chronic) (peripheral): Secondary | ICD-10-CM | POA: Diagnosis not present

## 2023-05-14 DIAGNOSIS — R058 Other specified cough: Secondary | ICD-10-CM | POA: Diagnosis not present

## 2023-05-14 DIAGNOSIS — M6281 Muscle weakness (generalized): Secondary | ICD-10-CM | POA: Diagnosis not present

## 2023-05-14 DIAGNOSIS — L89323 Pressure ulcer of left buttock, stage 3: Secondary | ICD-10-CM | POA: Diagnosis not present

## 2023-05-14 DIAGNOSIS — I700 Atherosclerosis of aorta: Secondary | ICD-10-CM | POA: Diagnosis not present

## 2023-05-14 DIAGNOSIS — E1165 Type 2 diabetes mellitus with hyperglycemia: Secondary | ICD-10-CM | POA: Diagnosis not present

## 2023-05-14 DIAGNOSIS — J069 Acute upper respiratory infection, unspecified: Secondary | ICD-10-CM | POA: Diagnosis not present

## 2023-05-14 DIAGNOSIS — Z1152 Encounter for screening for COVID-19: Secondary | ICD-10-CM | POA: Diagnosis not present

## 2023-05-14 DIAGNOSIS — Z794 Long term (current) use of insulin: Secondary | ICD-10-CM | POA: Diagnosis not present

## 2023-05-14 DIAGNOSIS — R197 Diarrhea, unspecified: Secondary | ICD-10-CM | POA: Diagnosis not present

## 2023-05-14 DIAGNOSIS — G4452 New daily persistent headache (NDPH): Secondary | ICD-10-CM | POA: Diagnosis not present

## 2023-05-15 DIAGNOSIS — Z794 Long term (current) use of insulin: Secondary | ICD-10-CM | POA: Diagnosis not present

## 2023-05-15 DIAGNOSIS — E1129 Type 2 diabetes mellitus with other diabetic kidney complication: Secondary | ICD-10-CM | POA: Diagnosis not present

## 2023-05-15 DIAGNOSIS — C61 Malignant neoplasm of prostate: Secondary | ICD-10-CM | POA: Diagnosis not present

## 2023-05-15 DIAGNOSIS — N138 Other obstructive and reflux uropathy: Secondary | ICD-10-CM | POA: Diagnosis not present

## 2023-05-15 DIAGNOSIS — I272 Pulmonary hypertension, unspecified: Secondary | ICD-10-CM | POA: Diagnosis not present

## 2023-05-15 DIAGNOSIS — E1165 Type 2 diabetes mellitus with hyperglycemia: Secondary | ICD-10-CM | POA: Diagnosis not present

## 2023-05-15 DIAGNOSIS — D630 Anemia in neoplastic disease: Secondary | ICD-10-CM | POA: Diagnosis not present

## 2023-05-15 DIAGNOSIS — I872 Venous insufficiency (chronic) (peripheral): Secondary | ICD-10-CM | POA: Diagnosis not present

## 2023-05-15 DIAGNOSIS — I700 Atherosclerosis of aorta: Secondary | ICD-10-CM | POA: Diagnosis not present

## 2023-05-15 DIAGNOSIS — Z8673 Personal history of transient ischemic attack (TIA), and cerebral infarction without residual deficits: Secondary | ICD-10-CM | POA: Diagnosis not present

## 2023-05-15 DIAGNOSIS — G4733 Obstructive sleep apnea (adult) (pediatric): Secondary | ICD-10-CM | POA: Diagnosis not present

## 2023-05-15 DIAGNOSIS — E1151 Type 2 diabetes mellitus with diabetic peripheral angiopathy without gangrene: Secondary | ICD-10-CM | POA: Diagnosis not present

## 2023-05-15 LAB — MULTIPLE MYELOMA PANEL, SERUM
Albumin SerPl Elph-Mcnc: 3.5 g/dL (ref 2.9–4.4)
Albumin/Glob SerPl: 1.3 (ref 0.7–1.7)
Alpha 1: 0.3 g/dL (ref 0.0–0.4)
Alpha2 Glob SerPl Elph-Mcnc: 1 g/dL (ref 0.4–1.0)
B-Globulin SerPl Elph-Mcnc: 1 g/dL (ref 0.7–1.3)
Gamma Glob SerPl Elph-Mcnc: 0.6 g/dL (ref 0.4–1.8)
Globulin, Total: 2.8 g/dL (ref 2.2–3.9)
IgA: 168 mg/dL (ref 61–437)
IgG (Immunoglobin G), Serum: 754 mg/dL (ref 603–1613)
IgM (Immunoglobulin M), Srm: 41 mg/dL (ref 15–143)
Total Protein ELP: 6.3 g/dL (ref 6.0–8.5)

## 2023-05-16 DIAGNOSIS — E1151 Type 2 diabetes mellitus with diabetic peripheral angiopathy without gangrene: Secondary | ICD-10-CM | POA: Diagnosis not present

## 2023-05-16 DIAGNOSIS — I872 Venous insufficiency (chronic) (peripheral): Secondary | ICD-10-CM | POA: Diagnosis not present

## 2023-05-16 DIAGNOSIS — E1129 Type 2 diabetes mellitus with other diabetic kidney complication: Secondary | ICD-10-CM | POA: Diagnosis not present

## 2023-05-16 DIAGNOSIS — I700 Atherosclerosis of aorta: Secondary | ICD-10-CM | POA: Diagnosis not present

## 2023-05-16 DIAGNOSIS — Z794 Long term (current) use of insulin: Secondary | ICD-10-CM | POA: Diagnosis not present

## 2023-05-16 DIAGNOSIS — E1165 Type 2 diabetes mellitus with hyperglycemia: Secondary | ICD-10-CM | POA: Diagnosis not present

## 2023-05-17 DIAGNOSIS — M4802 Spinal stenosis, cervical region: Secondary | ICD-10-CM | POA: Diagnosis not present

## 2023-05-17 DIAGNOSIS — D630 Anemia in neoplastic disease: Secondary | ICD-10-CM | POA: Diagnosis not present

## 2023-05-17 DIAGNOSIS — N401 Enlarged prostate with lower urinary tract symptoms: Secondary | ICD-10-CM | POA: Diagnosis not present

## 2023-05-17 DIAGNOSIS — Z6830 Body mass index (BMI) 30.0-30.9, adult: Secondary | ICD-10-CM | POA: Diagnosis not present

## 2023-05-17 DIAGNOSIS — E785 Hyperlipidemia, unspecified: Secondary | ICD-10-CM | POA: Diagnosis not present

## 2023-05-17 DIAGNOSIS — E1165 Type 2 diabetes mellitus with hyperglycemia: Secondary | ICD-10-CM | POA: Diagnosis not present

## 2023-05-17 DIAGNOSIS — I10 Essential (primary) hypertension: Secondary | ICD-10-CM | POA: Diagnosis not present

## 2023-05-17 DIAGNOSIS — I872 Venous insufficiency (chronic) (peripheral): Secondary | ICD-10-CM | POA: Diagnosis not present

## 2023-05-17 DIAGNOSIS — G4733 Obstructive sleep apnea (adult) (pediatric): Secondary | ICD-10-CM | POA: Diagnosis not present

## 2023-05-17 DIAGNOSIS — E1151 Type 2 diabetes mellitus with diabetic peripheral angiopathy without gangrene: Secondary | ICD-10-CM | POA: Diagnosis not present

## 2023-05-17 DIAGNOSIS — E1129 Type 2 diabetes mellitus with other diabetic kidney complication: Secondary | ICD-10-CM | POA: Diagnosis not present

## 2023-05-17 DIAGNOSIS — F329 Major depressive disorder, single episode, unspecified: Secondary | ICD-10-CM | POA: Diagnosis not present

## 2023-05-17 DIAGNOSIS — N138 Other obstructive and reflux uropathy: Secondary | ICD-10-CM | POA: Diagnosis not present

## 2023-05-17 DIAGNOSIS — Z794 Long term (current) use of insulin: Secondary | ICD-10-CM | POA: Diagnosis not present

## 2023-05-17 DIAGNOSIS — I272 Pulmonary hypertension, unspecified: Secondary | ICD-10-CM | POA: Diagnosis not present

## 2023-05-17 DIAGNOSIS — I700 Atherosclerosis of aorta: Secondary | ICD-10-CM | POA: Diagnosis not present

## 2023-05-17 DIAGNOSIS — I6529 Occlusion and stenosis of unspecified carotid artery: Secondary | ICD-10-CM | POA: Diagnosis not present

## 2023-05-17 DIAGNOSIS — G4700 Insomnia, unspecified: Secondary | ICD-10-CM | POA: Diagnosis not present

## 2023-05-17 DIAGNOSIS — Z7982 Long term (current) use of aspirin: Secondary | ICD-10-CM | POA: Diagnosis not present

## 2023-05-17 DIAGNOSIS — M5412 Radiculopathy, cervical region: Secondary | ICD-10-CM | POA: Diagnosis not present

## 2023-05-17 DIAGNOSIS — C61 Malignant neoplasm of prostate: Secondary | ICD-10-CM | POA: Diagnosis not present

## 2023-05-17 DIAGNOSIS — E669 Obesity, unspecified: Secondary | ICD-10-CM | POA: Diagnosis not present

## 2023-05-17 DIAGNOSIS — M8580 Other specified disorders of bone density and structure, unspecified site: Secondary | ICD-10-CM | POA: Diagnosis not present

## 2023-05-17 DIAGNOSIS — Z8673 Personal history of transient ischemic attack (TIA), and cerebral infarction without residual deficits: Secondary | ICD-10-CM | POA: Diagnosis not present

## 2023-05-17 DIAGNOSIS — R350 Frequency of micturition: Secondary | ICD-10-CM | POA: Diagnosis not present

## 2023-05-19 DIAGNOSIS — E1129 Type 2 diabetes mellitus with other diabetic kidney complication: Secondary | ICD-10-CM | POA: Diagnosis not present

## 2023-05-19 DIAGNOSIS — E1165 Type 2 diabetes mellitus with hyperglycemia: Secondary | ICD-10-CM | POA: Diagnosis not present

## 2023-05-19 DIAGNOSIS — Z794 Long term (current) use of insulin: Secondary | ICD-10-CM | POA: Diagnosis not present

## 2023-05-19 DIAGNOSIS — I700 Atherosclerosis of aorta: Secondary | ICD-10-CM | POA: Diagnosis not present

## 2023-05-19 DIAGNOSIS — I872 Venous insufficiency (chronic) (peripheral): Secondary | ICD-10-CM | POA: Diagnosis not present

## 2023-05-19 DIAGNOSIS — E1151 Type 2 diabetes mellitus with diabetic peripheral angiopathy without gangrene: Secondary | ICD-10-CM | POA: Diagnosis not present

## 2023-05-19 NOTE — Progress Notes (Signed)
 [facility] [address] [place] [place] [address] [phone]       HOSPITAL FOLLOW UP NOTE  [name] Date of Birth:  [birth year] Medical Record Number:  [id number]   Reason for Referral:  hospital stroke follow up    SUBJECTIVE:   CHIEF COMPLAINT:  Chief Complaint  Patient presents with   Consult    Pt in room 1. 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 Here for hospital follow up for stroke. Pt said he has a lot of concerns he wants to discuss. Pt said he can't walk, pt has PT/OT/ST at home. Pt reports being very nervous all the time.     HPI:   Kenneth [name] is a 73 y.o. who  has a past medical history of Broken neck (HCC), Cervical spinal stenosis, Diabetes mellitus, Diverticulosis, Hearing loss of both ears, History of colon polyps - adenomas (04/03/2010), Hyperlipidemia, Hypertension, Iron deficiency anemia, unspecified, OSA (obstructive sleep apnea), Pneumonia (03/2022), Prostate cancer (HCC), and Stroke (HCC).  Patient presented on 10/08/2022 with LLE > RLE weakness and difficulty walking. 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 CTA negative. Likely cardio embolic. DAPT x 3 weeks then Plavix  alone advised. LDL 45 on atorvastatin  and Zetia . A1C 5.6. Loop recorder placed by Dr Waddell 5/31. He was discharged to [facility] 10/10/2022. He was was discharged 10/17/2022. Personally reviewed hospitalization pertinent progress notes, lab work and imaging.  Evaluated by Jerri.   He was discharged home 10/17/2022. Since, he has been doing well. He was seen by Dr Lorilee 11/07/2022. BP was elevated. He was advised to continue Lotensin  20mg  daily and chlorthalidone  added. He continues to tolerate atorvastatin  and Plavix . No excessive or unusual bruising. Discussed stressors due to wife's poor health. She is currently in SNF following perforated ulcer with multiple complications following 08/2022. He was discharged from PT 7/1. He had residual right hip abduction weakness but feels  this has resolved. He continues HEP. He denies dizziness, lightheadedness or palpitations. He has follow up with PCP and cardiology 02/2023. He has returned to work as IT TRAINER. He has a sister who lives in [place] but available if he needs her. Wife's family in [place]. He does not have children.   He was diagnosed with OSA at least 5 years ago. Unsure of severity. He is adamant he does not wish to use CPAP. He has not considered an oral appliance.   Update 05/21/2023 ALL:  Kenneth [name] returns for follow up for recent CVA. He was initially seen by me 12/2022 following bilateral ACA infarcts 09/2022. He was doing well on Plavix  and had no residual deficits. Unfortunately, he presented the to ER 04/06/2023 following a 2 weeks span of dizziness/unsteadiness that suddenly worsened that morning. MRI showed small acute to subacute infarcts in the left frontal and parietal lobes and right temporal white matter. CTA head & neck showed similar diminutive distal right ACA. 2D Echo 60-65% EF. Loop recorder placed after first CVA has not shown concerns for afib. He was taking asa and Plavix  prior to admission and switched to asa and Brilinta  for 30 days then Plavix  only. LDL 47 on atorvastatin  and Zetia . A1C 8.2 on metformin , Toujeo  and Mounjaro . Therapy recommended outpatient PT. He was evaluated by Dr Jerri and discharged home.   Since discharge, he reports doing fair. He continues to have significant difficulty with his balance. He denies falls but feels off balance most times. He is using a walker. He is working with [facility] OT/PT multiple times per  week. Transportation limitations prevented outpatient therapy. He reports significant anxiety since this last event. He wakes at night and unable to go back to sleep. He follows with Dr Shayne regularly and taking fluoxetine . Recent blood work showed A1C now 6.7. CBGs run around 120-130 fasting. BP is usually 110-120/50-60s. He continues chlorthalidone  and benazepril . OSA diagnosed int he  past but he is adamant he is not interested in treating with CPAP or oral appliance at this time. Unclear severity. Reports testing with our lab but no documentation in Epic.  He was seen in consult with Dr Onesimo, hematology, 05/01/2023 following abnormal hypercoagulable panel with PCP. 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 Recommendation to consider switch of Brilinta  to DOAC and continue aspirin  81mg  daily.   No history of DVTs/PE. Doppler 11/2022 negative for DVT.    PERTINENT IMAGING/LABS  MRI  Small acute to subacute infarcts in the left frontal and parietal lobes and right temporal white matter.  CTA head & neck Similar diminutive distal right ACA 2D Echo 60-65% EF, echo w/ poor imaging, RV mildly dilated and hypokinetic  Loop recorder interrogated, no afib    A1C Lab Results  Component Value Date   HGBA1C 8.2 (H) 04/07/2023    Lipid Panel     Component Value Date/Time   CHOL 79 04/07/2023 0458   CHOL 159 02/10/2017 1307   TRIG 75 04/07/2023 0458   HDL 17 (L) 04/07/2023 0458   HDL 67 02/10/2017 1307   CHOLHDL 4.6 04/07/2023 0458   VLDL 15 04/07/2023 0458   LDLCALC 47 04/07/2023 0458   LDLCALC 58 02/10/2017 1307   LABVLDL 34 02/10/2017 1307      ROS:   14 system review of systems performed and negative with exception of those listed in HPI  PMH:  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   History of colon polyps - adenomas 04/03/2010   Hyperlipidemia    Hypertension    Iron deficiency anemia, unspecified    OSA (obstructive sleep apnea)    refusing CPAP   Pneumonia 03/2022   w/effusion   Prostate cancer (HCC)    Stroke  (HCC)     PSH:  Past Surgical History:  Procedure Laterality Date   ANTERIOR CERVICAL DECOMP/DISCECTOMY FUSION N/A 10/01/2012   Procedure: ANTERIOR CERVICAL DECOMPRESSION/DISCECTOMY FUSION 2 LEVELS;  Surgeon: Alm GORMAN Molt, MD;  Location: [facility];  Service: Neurosurgery;  Laterality: N/A;  Cervical five-six,Cervical six-seven   COLONOSCOPY  09/30/2010, 03/01/2014   diverticulosis, small internal hemorrhoids   COLONOSCOPY W/ POLYPECTOMY  04/03/2010   4 polyps 8-28mm, worst TV adenoma with high-grade dysplasia   LOOP RECORDER INSERTION N/A 10/10/2022   Procedure: LOOP RECORDER INSERTION;  Surgeon: Waddell Danelle ORN, MD;  Location: [facility] INVASIVE CV LAB;  Service: Cardiovascular;  Laterality: N/A;   NO PAST SURGERIES     POLYPECTOMY     POSTERIOR CERVICAL FUSION/FORAMINOTOMY Left 10/20/2012   Procedure: C/5-6,C/6-7 Lami/Multi level,POSTERIOR CERVICAL FUSION/FORAMINOTOMY C/4-7;  Surgeon: Alm GORMAN Molt, MD;  Location: [facility];  Service: Neurosurgery;  Laterality: Left;  Cervical Five-Six, Cervical Six-Seven Laminectomies, Posterior Cervical Fusion/Foraminotomies Cervical Four through Seven.  TONSILLECTOMY      Social History:  Social History   Socioeconomic History   Marital status: Married    Spouse name: Not on file   Number of children: Not on file   Years of education: Not on file   Highest education level: Not on file  Occupational History   Not on file  Tobacco Use   Smoking status: Former    Current packs/day: 0.00    Average packs/day: 0.3 packs/day for 2.0 years (0.5 ttl pk-yrs)    Types: Cigarettes    Start date: 09/23/1967    Quit date: 09/22/1969    Years since quitting: 53.6   Smokeless tobacco: Never  Vaping Use   Vaping status: Never Used  Substance and Sexual Activity   Alcohol  use: No   Drug use: No   Sexual activity: Yes  Other Topics Concern   Not on file  Social History Narrative   Right handed    Wear glasses   Social Drivers of Health    Financial Resource Strain: Not on file  Food Insecurity: No Food Insecurity (04/13/2023)   Hunger Vital Sign    Worried About Running Out of Food in the Last Year: Never true    Ran Out of Food in the Last Year: Never true  Transportation Needs: No Transportation Needs (04/13/2023)   PRAPARE - Administrator, Civil Service (Medical): No    Lack of Transportation (Non-Medical): No  Physical Activity: Not on file  Stress: Not on file  Social Connections: Not on file  Intimate Partner Violence: Not At Risk (04/13/2023)   Humiliation, Afraid, Rape, and Kick questionnaire    Fear of Current or Ex-Partner: No    Emotionally Abused: No    Physically Abused: No    Sexually Abused: No    Family History:  Family History  Problem Relation Age of Onset   Diabetes Mother    Stomach cancer Neg Hx    Rectal cancer Neg Hx    Pancreatic cancer Neg Hx    Colon cancer Neg Hx    Colon polyps Neg Hx    Esophageal cancer Neg Hx    Prostate cancer Neg Hx    Breast cancer Neg Hx     Medications:   Current Outpatient Medications on File Prior to Visit  Medication Sig Dispense Refill   aspirin  81 MG chewable tablet Chew 1 tablet (81 mg total) by mouth daily. 30 tablet 1   atorvastatin  (LIPITOR ) 40 MG tablet Take 1 tablet (40 mg total) by mouth daily. (Patient taking differently: Take 40 mg by mouth at bedtime.) 30 tablet 0   azithromycin  (ZITHROMAX ) 250 MG tablet Take 1 tablet (250 mg total) by mouth daily. Take first 2 tablets together, then 1 every day until finished. 6 tablet 0   benazepril  (LOTENSIN ) 40 MG tablet Take 20 mg by mouth in the morning and at bedtime.     BRILINTA  90 MG TABS tablet Take 90 mg by mouth 2 (two) times daily.     chlorthalidone  (HYGROTON ) 25 MG tablet Take 25 mg by mouth daily.     Cholecalciferol  (VITAMIN D3) 1000 units CAPS Take 1,000 Units by mouth in the morning and at bedtime.     Continuous Glucose Sensor (FREESTYLE LIBRE 14 DAY SENSOR) MISC Inject 1  Device into the skin every 14 (fourteen) days.     ezetimibe  (ZETIA ) 10 MG tablet Take 10 mg by mouth daily.     FLUoxetine  (PROZAC ) 10  MG capsule Take 10 mg by mouth daily.     metFORMIN  (GLUCOPHAGE -XR) 500 MG 24 hr tablet Take 500 mg by mouth in the morning and at bedtime.     MOUNJARO  5 MG/0.5ML Pen Inject 5 mg into the skin every Monday.     TOUJEO  SOLOSTAR 300 UNIT/ML Solostar Pen Inject 40 Units into the skin in the morning. (Patient taking differently: Inject 30 Units into the skin in the morning.)     No current facility-administered medications on file prior to visit.    Allergies:   Allergies  Allergen Reactions   Jardiance [Empagliflozin] Other (See Comments)    To the hospital- due to complications   Fluoxetine  Other (See Comments)    Prefers to NOT take this   Penicillins Other (See Comments)    CHILDHOOD ALLERGY- exact reaction? Has patient had a PCN reaction causing immediate rash, facial/tongue/throat swelling, SOB or lightheadedness with hypotension: Unknown Has patient had a PCN reaction causing severe rash involving mucus membranes or skin necrosis: Unknown Has patient had a PCN reaction that required hospitalization: Unknown Has patient had a PCN reaction occurring within the last 10 years: Unknown If all of the above answers are NO, then may proceed with Cephalosporin use.     Silodosin Other (See Comments)    Vertigo       OBJECTIVE:  Physical Exam  Vitals:   05/21/23 0759  BP: (!) 110/57  Pulse: 69  Weight: 195 lb 8 oz (88.7 kg)  Height: 5' 11 (1.803 m)    Body mass index is 27.27 kg/m. No results found.     11/07/2022   10:59 AM  Depression screen PHQ 2/9  Decreased Interest 0  Down, Depressed, Hopeless 0  PHQ - 2 Score 0  Altered sleeping 0  Tired, decreased energy 0  Change in appetite 0  Feeling bad or failure about yourself  0  Trouble concentrating 0  Moving slowly or fidgety/restless 0  Suicidal thoughts 0  PHQ-9 Score 0      General: well developed, well nourished, seated, in no evident distress Head: head normocephalic and atraumatic.   Neck: supple with no carotid or supraclavicular bruits Cardiovascular: regular rate and rhythm, no murmurs Musculoskeletal: no deformity Skin:  no rash/petichiae Vascular:  Normal pulses all extremities   Neurologic Exam Mental Status: Awake and fully alert.  Fluent speech and language.  Oriented to place and time. Recent and remote memory intact. Attention span, concentration and fund of knowledge appropriate. Mood and affect appropriate.  Cranial Nerves: Fundoscopic exam reveals sharp disc margins. Pupils equal, briskly reactive to light. Extraocular movements full without nystagmus. Visual fields full to confrontation. Hearing intact. Facial sensation intact. Face, tongue, palate moves normally and symmetrically.  Motor: Normal bulk and tone. Normal strength in all tested extremity muscles Sensory.: intact to touch , pinprick , position and vibratory sensation.  Coordination: Rapid alternating movements normal in all extremities. Finger-to-nose and heel-to-shin performed accurately bilaterally. Gait and Station: Arises from chair without difficulty. Stance is normal. Gait is slow and cautions but demonstrates normal stride length and balance with no assistive. Tandem not attempted due to safety concerns.  Reflexes: 1+ and symmetric.    NIHSS  0 Modified Rankin  1   ASSESSMENT: Kenneth [name] is a 73 y.o. year old male presenting to ER with LLE>RLEE weakness and difficulty walking. Vascular risk factors include HTN, DMT2, HLD, OSA, advanced age.    PLAN:  Multifocal strokes, Small acute/subacute infarcts in the  left frontal and parietal lobes, right temporal white matter. Etiology: suspect chronic small vessel disease: Residual deficit: imbalance and generalized weakness. Continue asa 81mg  daily, Brilinta  90mg  twice daily and atorvastatin  40mg  daily for secondary  stroke prevention. Discussed secondary stroke prevention measures and importance of close PCP follow up for aggressive stroke risk factor management. I have gone over the pathophysiology of stroke, warning signs and symptoms, risk factors and their management in some detail with instructions to go to the closest emergency room for symptoms of concern. HTN: BP goal <130/90.  Stable. Continue Lotensin  20mg  daily and chlorthalidone  per PCP.  HLD: LDL goal <70. Recent LDL 47. Continue atorvastatin  40mg  and Zetia  daily per PCP.  DMII: A1c goal<7.0. Recent A1c 6.7. Well controlled. Continue Toujeo  and Mounjaro .  Monitor closely. Well balanced diet and regular exercise advised.  Previous bilateral ACA infarcts, left more than right, infarct embolic concerning for cardioembolic source: continue loop recorder monitoring.  Hypercoagulable state: Prothrombin gene mutation, heterozygous Factor V Leiden and protein S deficiency. No history of DVT. We will order TCD bubble study for evaluation of PFO. Currently asymptomatic. Should DOAC be needed, would recommend discontinuing antiplatelet therapy.  Imbalance and generalized weakness: continue working with PT/OT as directed.  OSA: consider treatment with oral appliance or CPAP. Consider repeat testing if you wish.  Overweight: has lost about 16lbs since visit 12/2022. On Mounjaro  but also reporting significant anxiety. Continue to monitor closely. Continue working on [employer] with healthy diet and regular exercise.    Follow up with Dr Rosemarie in 4 months.    CC:  [facility] provider: Dr. Rosemarie PCP: Shayne Anes, MD    I spent 45 minutes of face-to-face and non-face-to-face time with patient.  This included previsit chart review including review of recent hospitalization, lab review, study review, order entry, electronic health record documentation, patient education regarding recent stroke including etiology, secondary stroke prevention measures and importance  of managing stroke risk factors, residual deficits and typical recovery time and answered all other questions to patient satisfaction   Greig Forbes, [facility]  [facility] [address] [place], [place] [address]  Phone [phone] Fax [phone] Note: This document was prepared with digital dictation and possible smart phrase technology. Any transcriptional errors that result from this process are unintentional.

## 2023-05-19 NOTE — Patient Instructions (Addendum)
 Below is our plan:  Multifocal strokes, Small acute/subacute infarcts in the left frontal and parietal lobes, right temporal white matter. Etiology: suspect chronic small vessel disease: Residual deficit: imbalance and generalized weakness. Continue asa 81mg  daily, Brilinta  90mg  twice daily and atorvastatin  40mg  daily for secondary stroke prevention. Discussed secondary stroke prevention measures and importance of close PCP follow up for aggressive stroke risk factor management. I have gone over the pathophysiology of stroke, warning signs and symptoms, risk factors and their management in some detail with instructions to go to the closest emergency room for symptoms of concern. HTN: BP goal <130/90.  Stable. Continue Lotensin  20mg  daily and chlorthalidone  per PCP.  HLD: LDL goal <70. Recent LDL 47. Continue atorvastatin  40mg  and Zetia  daily per PCP.  DMII: A1c goal<7.0. Recent A1c 6.7. Well controlled. Continue Toujeo  and Mounjaro .  Monitor closely. Well balanced diet and regular exercise advised.  Previous bilateral ACA infarcts, left more than right, infarct embolic concerning for cardioembolic source: continue loop recorder monitoring.  Hypercoagulable state: Prothrombin gene mutation, heterozygous Factor V Leiden and protein S deficiency. No history of DVT. We will order TCD bubble study for evaluation of PFO. Currently asymptomatic. Should DOAC be needed, would recommend discontinuing antiplatelet therapy.  Imbalance and generalized weakness: continue working with PT/OT as directed.  OSA: consider treatment with oral appliance or CPAP. Consider repeat testing if you wish.  Overweight: has lost about 16lbs since visit 12/2022. On Mounjaro  but also reporting significant anxiety. Continue to monitor closely. Continue working on [employer] with healthy diet and regular exercise.    Goals:  1) Maintain strict control of hypertension with blood pressure goal below 130/90 2) Maintain good control  of diabetes with hemoglobin A1c goal below 7%  3) Maintain good control of lipids with LDL cholesterol goal below 70 mg/dL.  4) Eat a healthy diet with plenty of whole grains, cereals, fruits and vegetables, exercise regularly and maintain ideal body weight   Resources: https://www.williams.biz/  Please make sure you are staying well hydrated. I recommend 50-60 ounces daily. Well balanced diet and regular exercise encouraged. Consistent sleep schedule with 6-8 hours recommended.   Please continue follow up with care team as directed.   Follow up with Kenneth [name] in 4-6 months   You may receive a survey regarding today's visit. I encourage you to leave honest feed back as I do use this information to improve patient care. Thank you for seeing me today!

## 2023-05-20 DIAGNOSIS — I872 Venous insufficiency (chronic) (peripheral): Secondary | ICD-10-CM | POA: Diagnosis not present

## 2023-05-20 DIAGNOSIS — E1165 Type 2 diabetes mellitus with hyperglycemia: Secondary | ICD-10-CM | POA: Diagnosis not present

## 2023-05-20 DIAGNOSIS — E1151 Type 2 diabetes mellitus with diabetic peripheral angiopathy without gangrene: Secondary | ICD-10-CM | POA: Diagnosis not present

## 2023-05-20 DIAGNOSIS — E1129 Type 2 diabetes mellitus with other diabetic kidney complication: Secondary | ICD-10-CM | POA: Diagnosis not present

## 2023-05-20 DIAGNOSIS — Z794 Long term (current) use of insulin: Secondary | ICD-10-CM | POA: Diagnosis not present

## 2023-05-20 DIAGNOSIS — I700 Atherosclerosis of aorta: Secondary | ICD-10-CM | POA: Diagnosis not present

## 2023-05-21 ENCOUNTER — Encounter: Payer: Self-pay | Admitting: Family Medicine

## 2023-05-21 ENCOUNTER — Ambulatory Visit (INDEPENDENT_AMBULATORY_CARE_PROVIDER_SITE_OTHER): Payer: Medicare Other | Admitting: Family Medicine

## 2023-05-21 VITALS — BP 110/57 | HR 69 | Ht 71.0 in | Wt 195.5 lb

## 2023-05-21 DIAGNOSIS — E1169 Type 2 diabetes mellitus with other specified complication: Secondary | ICD-10-CM

## 2023-05-21 DIAGNOSIS — I10 Essential (primary) hypertension: Secondary | ICD-10-CM | POA: Diagnosis not present

## 2023-05-21 DIAGNOSIS — I639 Cerebral infarction, unspecified: Secondary | ICD-10-CM | POA: Diagnosis not present

## 2023-05-21 DIAGNOSIS — D6859 Other primary thrombophilia: Secondary | ICD-10-CM

## 2023-05-21 DIAGNOSIS — G4733 Obstructive sleep apnea (adult) (pediatric): Secondary | ICD-10-CM | POA: Diagnosis not present

## 2023-05-21 DIAGNOSIS — Z794 Long term (current) use of insulin: Secondary | ICD-10-CM | POA: Diagnosis not present

## 2023-05-21 NOTE — Progress Notes (Signed)
 I agree with the above plan

## 2023-05-21 NOTE — Progress Notes (Signed)
 HEMATOLOGY/ONCOLOGY PHONE VISIT NOTE  Date of Service: 05/22/23   Patient Care Team: Shayne Anes, MD as PCP - General (Internal Medicine) Jeffrie Anes BROCKS, MD as PCP - Cardiology (Cardiology) Grayce Buddle, RN as Oncology Nurse Navigator  CHIEF COMPLAINTS/PURPOSE OF CONSULTATION:  low Protein-S levels with recurrent CVA. Compound heterozygous state for Factor V Leiden mutation and Prothrombin gene mutation.  HISTORY OF PRESENTING ILLNESS:   Kenneth [name] is a wonderful 73 y.o. male who has been referred to us  by Dr. Anes Shayne for evaluation and management of low Protein-S levels with recurrent CVA. He also notes that he was subsequently noted to have Compound heterozygous state for Factor V Leiden mutation and Prothrombin gene mutation.  Labs from 04/13/2023 showed elevated Monocytes of 16.5%, lab range from 4.6 to 12.4%, and elevated Monocytes absolute of 1.4, lab range from 0.1-0.9. Protein-S total in the normal range of 67%, lab range from 60-150%, Protein-S free level low at 53%, lab range from 61-136%.  Patient is accompanied by his wife and his nurse during this visit. Patient notes that his first stroke was in May 2024. He denies any past medical stroke of stroke before May. His symptom in May was right leg weakness, which caused him to fall. He was prescribed Plavix  and he denies being on acid suppresant while he was on palvix. His symptoms improved after his first stroke in May.   Neurologist had an Brain MRI in May, which showed Acute bilateral ACA territory infarcts (left-greater-than-right) involving the parasagittal aspects of the bilateral parietal lobes.   Patient notes that before his first stroke, his blood pressure was around 125-130. Currently, his blood pressure after his stroke is around 110-115.   Patient denies any family history of blood disorder or early heart disease.   Patient is currently on Asprin and Brilinta .  Patient started Mounjaro  around one  year ago.    Patient notes that his blood pressure, diabetes, and hyperlipidemia are well-controlled with medications.   Patient's wife notes that his first cousin on his mother side is having blood clotting issues. They are unsure of what kind of blood clot. He has one sister who has never had blood clots.   He notes his father was taking Coumadin. Patient notes that his father was between 18-19 years old when he first started Coumadin.   Patient had second stroke in November. Symptoms that took him to the hospital in November was bilateral leg weakness and unable to walk. After his second stroke, patient's energy levels and weakness, mainly in legs, have worsened.   He has retired from work due to work stress. He worked as an IT TRAINER.   Patient notes he recently had mutation testing at Dr. Anes Shayne, which showed patient has heterozygous Prothrombin gene mutation and Factor-5-leiden mutation. We don't have this results and will contact Dr. Shayne to confirm the results.   During this visit, he complains of neck pain, lethargy, and bilateral leg weakness. He notes he has lost 20 lbs in 3-4 weeks. He denies any new infection issues, fever, chills, night sweats, back pain, or leg swelling.   INTERVAL HISTORY:  Kenneth [name] is a 73 y.o. male here for continued evaluation and management of  low Protein-S levels with recurrent CVA as well as Compound heterozygous state for Factor V Leiden mutation and Prothrombin gene mutation. He was initially seen by me on 05/01/2023 and he reported having a CVA episode in May with right leg weakness causing a  fall. He also reported having a second stroke in November with bilateral leg weakness. After his second stroke, he noted worsened energy levels and weakness, particularly in the lower extremities. During the visit, he complained of neck pain , lethargy, bilateral leg weakness, and 20-pound weight loss in 3-4 weeks.   I connected with Kenneth [name] on  01/10/25at  3:30 PM EST by telephone visit and verified that I am speaking with the correct person using two identifiers.   I discussed the limitations, risks, security and privacy concerns of performing an evaluation and management service by telemedicine and the availability of in-person appointments. I also discussed with the patient that there may be a patient responsible charge related to this service. The patient expressed understanding and agreed to proceed.   Other persons participating in the visit and their role in the encounter: his wife  Patient's location: home  Provider's location: [facility]   Chief Complaint: continued evaluation and management of  low Protein-S levels with recurrent CVA as well as Compound heterozygous state for Factor V Leiden mutation and Prothrombin gene mutation .     Today, he reports no new symptomatic concerns since the last visit.   He met with his Greig Forbes, NP and had a discussion in regards to medication options. There are plans for upcoming transcranial US  as well as another study.   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   History of colon polyps - adenomas 04/03/2010   Hyperlipidemia    Hypertension    Iron deficiency anemia, unspecified    OSA (obstructive sleep apnea)    refusing CPAP   Pneumonia 03/2022   w/effusion   Prostate cancer (HCC)    Stroke [facility])     SURGICAL HISTORY: Past Surgical History:  Procedure Laterality Date   ANTERIOR CERVICAL DECOMP/DISCECTOMY FUSION N/A 10/01/2012   Procedure: ANTERIOR CERVICAL DECOMPRESSION/DISCECTOMY FUSION 2 LEVELS;  Surgeon: Alm GORMAN Molt, MD;  Location: [facility]UR[facility];  Service: Neurosurgery;  Laterality: N/A;  Cervical five-six,Cervical six-seven   COLONOSCOPY  09/30/2010, 03/01/2014    diverticulosis, small internal hemorrhoids   COLONOSCOPY W/ POLYPECTOMY  04/03/2010   4 polyps 8-15mm, worst TV adenoma with high-grade dysplasia   LOOP RECORDER INSERTION N/A 10/10/2022   Procedure: LOOP RECORDER INSERTION;  Surgeon: Waddell Danelle ORN, MD;  Location: [facility] INVASIVE CV LAB;  Service: Cardiovascular;  Laterality: N/A;   NO PAST SURGERIES     POLYPECTOMY     POSTERIOR CERVICAL FUSION/FORAMINOTOMY Left 10/20/2012   Procedure: C/5-6,C/6-7 Lami/Multi level,POSTERIOR CERVICAL FUSION/FORAMINOTOMY C/4-7;  Surgeon: Alm GORMAN Molt, MD;  Location: [facility] NEURO ORS;  Service: Neurosurgery;  Laterality: Left;  Cervical Five-Six, Cervical Six-Seven Laminectomies, Posterior Cervical Fusion/Foraminotomies Cervical Four through Seven.    TONSILLECTOMY      SOCIAL HISTORY: Social History   Socioeconomic History   Marital status: Married    Spouse name: Not on file   Number of children: Not on file   Years of education: Not on file   Highest education level: Not on file  Occupational History   Not on file  Tobacco Use   Smoking status: Former    Current packs/day: 0.00    Average packs/day: 0.3 packs/day for 2.0 years (0.5 ttl pk-yrs)  Types: Cigarettes    Start date: 09/23/1967    Quit date: 09/22/1969    Years since quitting: 53.6   Smokeless tobacco: Never  Vaping Use   Vaping status: Never Used  Substance and Sexual Activity   Alcohol  use: No   Drug use: No   Sexual activity: Yes  Other Topics Concern   Not on file  Social History Narrative   Right handed    Wear glasses   Social Drivers of Health   Financial Resource Strain: Not on file  Food Insecurity: No Food Insecurity (04/13/2023)   Hunger Vital Sign    Worried About Running Out of Food in the Last Year: Never true    Ran Out of Food in the Last Year: Never true  Transportation Needs: No Transportation Needs (04/13/2023)   PRAPARE - Administrator, Civil Service (Medical): No    Lack of Transportation  (Non-Medical): No  Physical Activity: Not on file  Stress: Not on file  Social Connections: Not on file  Intimate Partner Violence: Not At Risk (04/13/2023)   Humiliation, Afraid, Rape, and Kick questionnaire    Fear of Current or Ex-Partner: No    Emotionally Abused: No    Physically Abused: No    Sexually Abused: No    FAMILY HISTORY: Family History  Problem Relation Age of Onset   Diabetes Mother    Stomach cancer Neg Hx    Rectal cancer Neg Hx    Pancreatic cancer Neg Hx    Colon cancer Neg Hx    Colon polyps Neg Hx    Esophageal cancer Neg Hx    Prostate cancer Neg Hx    Breast cancer Neg Hx     ALLERGIES:  is allergic to jardiance [empagliflozin], fluoxetine , penicillins, and silodosin.  MEDICATIONS:  Current Outpatient Medications  Medication Sig Dispense Refill   aspirin  81 MG chewable tablet Chew 1 tablet (81 mg total) by mouth daily. 30 tablet 1   atorvastatin  (LIPITOR ) 40 MG tablet Take 1 tablet (40 mg total) by mouth daily. (Patient taking differently: Take 40 mg by mouth at bedtime.) 30 tablet 0   azithromycin  (ZITHROMAX ) 250 MG tablet Take 1 tablet (250 mg total) by mouth daily. Take first 2 tablets together, then 1 every day until finished. 6 tablet 0   benazepril  (LOTENSIN ) 40 MG tablet Take 20 mg by mouth in the morning and at bedtime.     BRILINTA  90 MG TABS tablet Take 90 mg by mouth 2 (two) times daily.     chlorthalidone  (HYGROTON ) 25 MG tablet Take 25 mg by mouth daily.     Cholecalciferol  (VITAMIN D3) 1000 units CAPS Take 1,000 Units by mouth in the morning and at bedtime.     Continuous Glucose Sensor (FREESTYLE LIBRE 14 DAY SENSOR) MISC Inject 1 Device into the skin every 14 (fourteen) days.     ezetimibe  (ZETIA ) 10 MG tablet Take 10 mg by mouth daily.     FLUoxetine  (PROZAC ) 10 MG capsule Take 10 mg by mouth daily.     metFORMIN  (GLUCOPHAGE -XR) 500 MG 24 hr tablet Take 500 mg by mouth in the morning and at bedtime.     MOUNJARO  5 MG/0.5ML Pen Inject  5 mg into the skin every Monday.     TOUJEO  SOLOSTAR 300 UNIT/ML Solostar Pen Inject 40 Units into the skin in the morning. (Patient taking differently: Inject 30 Units into the skin in the morning.)     No current facility-administered medications  for this visit.    REVIEW OF SYSTEMS:    10 Point review of Systems was done is negative except as noted above.   PHYSICAL EXAMINATION: TELEMEDICINE VISIT ECOG PERFORMANCE STATUS: 2 - Symptomatic, <50% confined to bed  LABORATORY DATA:  I have reviewed the data as listed  .    Latest Ref Rng & Units 05/01/2023   12:04 PM 04/11/2023   11:13 AM 04/08/2023    5:55 AM  CBC  WBC 4.0 - 10.5 K/uL 11.3  8.5  9.4   Hemoglobin 13.0 - 17.0 g/dL 87.6  87.6  87.7   Hematocrit 39.0 - 52.0 % 34.9  36.2  36.2   Platelets 150 - 400 K/uL 249  153  168     .    Latest Ref Rng & Units 05/01/2023   12:04 PM 04/11/2023   11:13 AM 04/07/2023    4:58 AM  CMP  Glucose 70 - 99 mg/dL 892  846  891   BUN 8 - 23 mg/dL 17  16  12    Creatinine 0.61 - 1.24 mg/dL 9.05  9.09  8.91   Sodium 135 - 145 mmol/L 135  134  138   Potassium 3.5 - 5.1 mmol/L 3.8  3.2  3.7   Chloride 98 - 111 mmol/L 100  99  101   CO2 22 - 32 mmol/L 26  24  28    Calcium  8.9 - 10.3 mg/dL 9.7  9.1  9.3   Total Protein 6.5 - 8.1 g/dL 6.4  6.0  5.9   Total Bilirubin <1.2 mg/dL 0.9  1.2  1.2   Alkaline Phos 38 - 126 U/L 53  43  38   AST 15 - 41 U/L 18  20  17    ALT 0 - 44 U/L 22  17  16       RADIOGRAPHIC STUDIES: I have personally reviewed the radiological images as listed and agreed with the findings in the report. CUP PACEART REMOTE DEVICE CHECK Result Date: 05/05/2023 ILR summary report received. Battery status OK. Normal device function. No new symptom, brady, or pause episodes. No new AF episodes. 1 false tachy episode, oversensing EMI. Monthly summary reports and ROV/PRN [place], CV[facility]   ASSESSMENT & PLAN:   73 y.o. male with:  Protein S deficiency Heterozygous Prothrombin  gene mutation and Factor-5-leidien mutation. - waiting for confirmation from Dr. Shayne. Patient had two strokes, one in [id number] and one in November 2024 this year.   PLAN: Patient does have  a hypercoagulable state due to Compound heterozygous state from Factor V leiden heterozygous mutation and Prothrombin gene heterozygous mutation and in addition has Protein S deficiency. This combination of findings could increase the risk of VTE as well as potentially arterial thrombosis.  -Discussed lab results from 05/01/2023 in detail with patient. CBC showed WBC of 11.3K, hemoglobin of 12.3, and platelets of 249K. -testing does show confirmation of Heterozygous mutation for prothrombin gene and factor V leiden -protein S levels low -discussed that patient does have multiple risk factors for venous blood clots  -patient has not previously had an unprovoked DVT or PE in the past -advised patient to connect with his neurologist to evaluate the main mechanism causing his stroke such as small vessel disease related to abnormal narrowing of blood vessels or platelets clogging up the arteries  -educated patient that venous blood clots can cause strokes. Also discussed that when they come together, they can cause arterial clots too.  -discussed that there may  be a role for either taking two antiplatelet therapies or one antiplatelet therapy with an anticoagulant to be determined by his neurologist.  At this time Dr Thedora recommends continuing Brillinta and ASA and plan to get a transcranial doppler bubble study. -discussed that he cannot be on two antiplatelet therapies while on Eliquis at this time as the risk of bleeding would be significantly high -discussed that if three is any concern for stroke-like symptoms while on this combination, then there may be role for blood thinners.  -recommend patient's family member's to be screened for factor 5 leiden, prothrombin gene, and protein S levels  -discussed that if  there is any need for surgery or other any other situation which may increase the risk of blood clots such as long-distance traveling, there would be recommendations in regards to how to reduce the risk of blood clots in those situations -answered all of patient's and his wife's questions in detail  FOLLOW-UP: RTC with Dr Onesimo as needed  The total time spent in the appointment was 30 minutes* .  All of the patient's questions were answered with apparent satisfaction. The patient knows to call the clinic with any problems, questions or concerns.   Emaline Onesimo MD MS AAHIVMS [facility] Hematology/Oncology Physician [facility]  .*Total Encounter Time as defined by the Centers for Medicare and Medicaid Services includes, in addition to the face-to-face time of a patient visit (documented in the note above) non-face-to-face time: obtaining and reviewing outside history, ordering and reviewing medications, tests or procedures, care coordination (communications with other health care professionals or caregivers) and documentation in the medical record.    I,Mitra Faeizi,acting as a neurosurgeon for Emaline Onesimo, MD.,have documented all relevant documentation on the behalf of Emaline Onesimo, MD,as directed by  Emaline Onesimo, MD while in the presence of Emaline Onesimo, MD.  .I have reviewed the above documentation for accuracy and completeness, and I agree with the above. .Josselin Gaulin Kishore Ilanna Deihl MD

## 2023-05-22 ENCOUNTER — Inpatient Hospital Stay: Payer: Medicare Other | Attending: Hematology | Admitting: Hematology

## 2023-05-22 DIAGNOSIS — I639 Cerebral infarction, unspecified: Secondary | ICD-10-CM | POA: Diagnosis not present

## 2023-05-22 DIAGNOSIS — Z7902 Long term (current) use of antithrombotics/antiplatelets: Secondary | ICD-10-CM | POA: Insufficient documentation

## 2023-05-22 DIAGNOSIS — Z7901 Long term (current) use of anticoagulants: Secondary | ICD-10-CM | POA: Insufficient documentation

## 2023-05-22 DIAGNOSIS — G4733 Obstructive sleep apnea (adult) (pediatric): Secondary | ICD-10-CM | POA: Diagnosis not present

## 2023-05-22 DIAGNOSIS — Z8673 Personal history of transient ischemic attack (TIA), and cerebral infarction without residual deficits: Secondary | ICD-10-CM | POA: Insufficient documentation

## 2023-05-22 DIAGNOSIS — E119 Type 2 diabetes mellitus without complications: Secondary | ICD-10-CM | POA: Insufficient documentation

## 2023-05-22 DIAGNOSIS — Z79899 Other long term (current) drug therapy: Secondary | ICD-10-CM | POA: Diagnosis not present

## 2023-05-22 DIAGNOSIS — R531 Weakness: Secondary | ICD-10-CM | POA: Insufficient documentation

## 2023-05-22 DIAGNOSIS — I700 Atherosclerosis of aorta: Secondary | ICD-10-CM | POA: Diagnosis not present

## 2023-05-22 DIAGNOSIS — E1165 Type 2 diabetes mellitus with hyperglycemia: Secondary | ICD-10-CM | POA: Diagnosis not present

## 2023-05-22 DIAGNOSIS — Z566 Other physical and mental strain related to work: Secondary | ICD-10-CM | POA: Insufficient documentation

## 2023-05-22 DIAGNOSIS — D6859 Other primary thrombophilia: Secondary | ICD-10-CM | POA: Diagnosis not present

## 2023-05-22 DIAGNOSIS — E785 Hyperlipidemia, unspecified: Secondary | ICD-10-CM | POA: Diagnosis not present

## 2023-05-22 DIAGNOSIS — D6851 Activated protein C resistance: Secondary | ICD-10-CM | POA: Insufficient documentation

## 2023-05-22 DIAGNOSIS — Z7984 Long term (current) use of oral hypoglycemic drugs: Secondary | ICD-10-CM | POA: Diagnosis not present

## 2023-05-22 DIAGNOSIS — D6852 Prothrombin gene mutation: Secondary | ICD-10-CM | POA: Insufficient documentation

## 2023-05-22 DIAGNOSIS — Z87891 Personal history of nicotine dependence: Secondary | ICD-10-CM | POA: Insufficient documentation

## 2023-05-22 DIAGNOSIS — I10 Essential (primary) hypertension: Secondary | ICD-10-CM | POA: Diagnosis not present

## 2023-05-22 DIAGNOSIS — D509 Iron deficiency anemia, unspecified: Secondary | ICD-10-CM | POA: Insufficient documentation

## 2023-05-22 DIAGNOSIS — E1151 Type 2 diabetes mellitus with diabetic peripheral angiopathy without gangrene: Secondary | ICD-10-CM | POA: Diagnosis not present

## 2023-05-22 DIAGNOSIS — Z7985 Long-term (current) use of injectable non-insulin antidiabetic drugs: Secondary | ICD-10-CM | POA: Insufficient documentation

## 2023-05-22 DIAGNOSIS — Z794 Long term (current) use of insulin: Secondary | ICD-10-CM | POA: Insufficient documentation

## 2023-05-22 DIAGNOSIS — Z7982 Long term (current) use of aspirin: Secondary | ICD-10-CM | POA: Insufficient documentation

## 2023-05-22 DIAGNOSIS — I872 Venous insufficiency (chronic) (peripheral): Secondary | ICD-10-CM | POA: Diagnosis not present

## 2023-05-22 DIAGNOSIS — Z860101 Personal history of adenomatous and serrated colon polyps: Secondary | ICD-10-CM | POA: Diagnosis not present

## 2023-05-22 DIAGNOSIS — E1129 Type 2 diabetes mellitus with other diabetic kidney complication: Secondary | ICD-10-CM | POA: Diagnosis not present

## 2023-05-25 DIAGNOSIS — E1165 Type 2 diabetes mellitus with hyperglycemia: Secondary | ICD-10-CM | POA: Diagnosis not present

## 2023-05-25 DIAGNOSIS — E1129 Type 2 diabetes mellitus with other diabetic kidney complication: Secondary | ICD-10-CM | POA: Diagnosis not present

## 2023-05-25 DIAGNOSIS — I700 Atherosclerosis of aorta: Secondary | ICD-10-CM | POA: Diagnosis not present

## 2023-05-25 DIAGNOSIS — E1151 Type 2 diabetes mellitus with diabetic peripheral angiopathy without gangrene: Secondary | ICD-10-CM | POA: Diagnosis not present

## 2023-05-25 DIAGNOSIS — I872 Venous insufficiency (chronic) (peripheral): Secondary | ICD-10-CM | POA: Diagnosis not present

## 2023-05-25 DIAGNOSIS — Z794 Long term (current) use of insulin: Secondary | ICD-10-CM | POA: Diagnosis not present

## 2023-05-26 DIAGNOSIS — I700 Atherosclerosis of aorta: Secondary | ICD-10-CM | POA: Diagnosis not present

## 2023-05-26 DIAGNOSIS — E1129 Type 2 diabetes mellitus with other diabetic kidney complication: Secondary | ICD-10-CM | POA: Diagnosis not present

## 2023-05-26 DIAGNOSIS — Z794 Long term (current) use of insulin: Secondary | ICD-10-CM | POA: Diagnosis not present

## 2023-05-26 DIAGNOSIS — I872 Venous insufficiency (chronic) (peripheral): Secondary | ICD-10-CM | POA: Diagnosis not present

## 2023-05-26 DIAGNOSIS — E1165 Type 2 diabetes mellitus with hyperglycemia: Secondary | ICD-10-CM | POA: Diagnosis not present

## 2023-05-26 DIAGNOSIS — E1151 Type 2 diabetes mellitus with diabetic peripheral angiopathy without gangrene: Secondary | ICD-10-CM | POA: Diagnosis not present

## 2023-05-28 DIAGNOSIS — Z794 Long term (current) use of insulin: Secondary | ICD-10-CM | POA: Diagnosis not present

## 2023-05-28 DIAGNOSIS — I872 Venous insufficiency (chronic) (peripheral): Secondary | ICD-10-CM | POA: Diagnosis not present

## 2023-05-28 DIAGNOSIS — I700 Atherosclerosis of aorta: Secondary | ICD-10-CM | POA: Diagnosis not present

## 2023-05-28 DIAGNOSIS — E1151 Type 2 diabetes mellitus with diabetic peripheral angiopathy without gangrene: Secondary | ICD-10-CM | POA: Diagnosis not present

## 2023-05-28 DIAGNOSIS — E1165 Type 2 diabetes mellitus with hyperglycemia: Secondary | ICD-10-CM | POA: Diagnosis not present

## 2023-05-28 DIAGNOSIS — E1129 Type 2 diabetes mellitus with other diabetic kidney complication: Secondary | ICD-10-CM | POA: Diagnosis not present

## 2023-05-29 DIAGNOSIS — I700 Atherosclerosis of aorta: Secondary | ICD-10-CM | POA: Diagnosis not present

## 2023-05-29 DIAGNOSIS — E1151 Type 2 diabetes mellitus with diabetic peripheral angiopathy without gangrene: Secondary | ICD-10-CM | POA: Diagnosis not present

## 2023-05-29 DIAGNOSIS — Z794 Long term (current) use of insulin: Secondary | ICD-10-CM | POA: Diagnosis not present

## 2023-05-29 DIAGNOSIS — E1129 Type 2 diabetes mellitus with other diabetic kidney complication: Secondary | ICD-10-CM | POA: Diagnosis not present

## 2023-05-29 DIAGNOSIS — E1165 Type 2 diabetes mellitus with hyperglycemia: Secondary | ICD-10-CM | POA: Diagnosis not present

## 2023-05-29 DIAGNOSIS — I872 Venous insufficiency (chronic) (peripheral): Secondary | ICD-10-CM | POA: Diagnosis not present

## 2023-06-02 DIAGNOSIS — Z794 Long term (current) use of insulin: Secondary | ICD-10-CM | POA: Diagnosis not present

## 2023-06-02 DIAGNOSIS — E1129 Type 2 diabetes mellitus with other diabetic kidney complication: Secondary | ICD-10-CM | POA: Diagnosis not present

## 2023-06-02 DIAGNOSIS — I700 Atherosclerosis of aorta: Secondary | ICD-10-CM | POA: Diagnosis not present

## 2023-06-02 DIAGNOSIS — E1165 Type 2 diabetes mellitus with hyperglycemia: Secondary | ICD-10-CM | POA: Diagnosis not present

## 2023-06-02 DIAGNOSIS — I872 Venous insufficiency (chronic) (peripheral): Secondary | ICD-10-CM | POA: Diagnosis not present

## 2023-06-02 DIAGNOSIS — E1151 Type 2 diabetes mellitus with diabetic peripheral angiopathy without gangrene: Secondary | ICD-10-CM | POA: Diagnosis not present

## 2023-06-03 DIAGNOSIS — E1129 Type 2 diabetes mellitus with other diabetic kidney complication: Secondary | ICD-10-CM | POA: Diagnosis not present

## 2023-06-03 DIAGNOSIS — E1165 Type 2 diabetes mellitus with hyperglycemia: Secondary | ICD-10-CM | POA: Diagnosis not present

## 2023-06-03 DIAGNOSIS — I872 Venous insufficiency (chronic) (peripheral): Secondary | ICD-10-CM | POA: Diagnosis not present

## 2023-06-03 DIAGNOSIS — Z794 Long term (current) use of insulin: Secondary | ICD-10-CM | POA: Diagnosis not present

## 2023-06-03 DIAGNOSIS — I700 Atherosclerosis of aorta: Secondary | ICD-10-CM | POA: Diagnosis not present

## 2023-06-03 DIAGNOSIS — E1151 Type 2 diabetes mellitus with diabetic peripheral angiopathy without gangrene: Secondary | ICD-10-CM | POA: Diagnosis not present

## 2023-06-04 DIAGNOSIS — E1151 Type 2 diabetes mellitus with diabetic peripheral angiopathy without gangrene: Secondary | ICD-10-CM | POA: Diagnosis not present

## 2023-06-04 DIAGNOSIS — I872 Venous insufficiency (chronic) (peripheral): Secondary | ICD-10-CM | POA: Diagnosis not present

## 2023-06-04 DIAGNOSIS — E1165 Type 2 diabetes mellitus with hyperglycemia: Secondary | ICD-10-CM | POA: Diagnosis not present

## 2023-06-04 DIAGNOSIS — E1129 Type 2 diabetes mellitus with other diabetic kidney complication: Secondary | ICD-10-CM | POA: Diagnosis not present

## 2023-06-04 DIAGNOSIS — Z794 Long term (current) use of insulin: Secondary | ICD-10-CM | POA: Diagnosis not present

## 2023-06-04 DIAGNOSIS — I700 Atherosclerosis of aorta: Secondary | ICD-10-CM | POA: Diagnosis not present

## 2023-06-08 ENCOUNTER — Ambulatory Visit: Payer: Medicare Other

## 2023-06-08 DIAGNOSIS — I639 Cerebral infarction, unspecified: Secondary | ICD-10-CM | POA: Diagnosis not present

## 2023-06-08 LAB — CUP PACEART REMOTE DEVICE CHECK: Date Time Interrogation Session: 20250126232532

## 2023-06-09 DIAGNOSIS — E1151 Type 2 diabetes mellitus with diabetic peripheral angiopathy without gangrene: Secondary | ICD-10-CM | POA: Diagnosis not present

## 2023-06-09 DIAGNOSIS — E1129 Type 2 diabetes mellitus with other diabetic kidney complication: Secondary | ICD-10-CM | POA: Diagnosis not present

## 2023-06-09 DIAGNOSIS — I700 Atherosclerosis of aorta: Secondary | ICD-10-CM | POA: Diagnosis not present

## 2023-06-09 DIAGNOSIS — Z794 Long term (current) use of insulin: Secondary | ICD-10-CM | POA: Diagnosis not present

## 2023-06-09 DIAGNOSIS — I872 Venous insufficiency (chronic) (peripheral): Secondary | ICD-10-CM | POA: Diagnosis not present

## 2023-06-09 DIAGNOSIS — E1165 Type 2 diabetes mellitus with hyperglycemia: Secondary | ICD-10-CM | POA: Diagnosis not present

## 2023-06-11 DIAGNOSIS — E1129 Type 2 diabetes mellitus with other diabetic kidney complication: Secondary | ICD-10-CM | POA: Diagnosis not present

## 2023-06-11 DIAGNOSIS — E1165 Type 2 diabetes mellitus with hyperglycemia: Secondary | ICD-10-CM | POA: Diagnosis not present

## 2023-06-11 DIAGNOSIS — I700 Atherosclerosis of aorta: Secondary | ICD-10-CM | POA: Diagnosis not present

## 2023-06-11 DIAGNOSIS — E1151 Type 2 diabetes mellitus with diabetic peripheral angiopathy without gangrene: Secondary | ICD-10-CM | POA: Diagnosis not present

## 2023-06-11 DIAGNOSIS — Z794 Long term (current) use of insulin: Secondary | ICD-10-CM | POA: Diagnosis not present

## 2023-06-11 DIAGNOSIS — I872 Venous insufficiency (chronic) (peripheral): Secondary | ICD-10-CM | POA: Diagnosis not present

## 2023-06-12 DIAGNOSIS — F329 Major depressive disorder, single episode, unspecified: Secondary | ICD-10-CM | POA: Diagnosis not present

## 2023-06-12 DIAGNOSIS — E1151 Type 2 diabetes mellitus with diabetic peripheral angiopathy without gangrene: Secondary | ICD-10-CM | POA: Diagnosis not present

## 2023-06-12 DIAGNOSIS — M6281 Muscle weakness (generalized): Secondary | ICD-10-CM | POA: Diagnosis not present

## 2023-06-12 DIAGNOSIS — E1165 Type 2 diabetes mellitus with hyperglycemia: Secondary | ICD-10-CM | POA: Diagnosis not present

## 2023-06-12 DIAGNOSIS — G4700 Insomnia, unspecified: Secondary | ICD-10-CM | POA: Diagnosis not present

## 2023-06-12 DIAGNOSIS — I872 Venous insufficiency (chronic) (peripheral): Secondary | ICD-10-CM | POA: Diagnosis not present

## 2023-06-12 DIAGNOSIS — R2689 Other abnormalities of gait and mobility: Secondary | ICD-10-CM | POA: Diagnosis not present

## 2023-06-12 DIAGNOSIS — F419 Anxiety disorder, unspecified: Secondary | ICD-10-CM | POA: Diagnosis not present

## 2023-06-12 DIAGNOSIS — I700 Atherosclerosis of aorta: Secondary | ICD-10-CM | POA: Diagnosis not present

## 2023-06-12 DIAGNOSIS — E1129 Type 2 diabetes mellitus with other diabetic kidney complication: Secondary | ICD-10-CM | POA: Diagnosis not present

## 2023-06-12 DIAGNOSIS — Z794 Long term (current) use of insulin: Secondary | ICD-10-CM | POA: Diagnosis not present

## 2023-06-12 NOTE — Addendum Note (Signed)
Addended by: Geralyn Flash D on: 06/12/2023 11:56 AM   Modules accepted: Orders

## 2023-06-12 NOTE — Progress Notes (Signed)
 [facility] Summary Report / Loop Recorder

## 2023-06-15 DIAGNOSIS — E1151 Type 2 diabetes mellitus with diabetic peripheral angiopathy without gangrene: Secondary | ICD-10-CM | POA: Diagnosis not present

## 2023-06-15 DIAGNOSIS — Z794 Long term (current) use of insulin: Secondary | ICD-10-CM | POA: Diagnosis not present

## 2023-06-15 DIAGNOSIS — E1129 Type 2 diabetes mellitus with other diabetic kidney complication: Secondary | ICD-10-CM | POA: Diagnosis not present

## 2023-06-15 DIAGNOSIS — I700 Atherosclerosis of aorta: Secondary | ICD-10-CM | POA: Diagnosis not present

## 2023-06-15 DIAGNOSIS — I872 Venous insufficiency (chronic) (peripheral): Secondary | ICD-10-CM | POA: Diagnosis not present

## 2023-06-15 DIAGNOSIS — E1165 Type 2 diabetes mellitus with hyperglycemia: Secondary | ICD-10-CM | POA: Diagnosis not present

## 2023-06-16 DIAGNOSIS — D630 Anemia in neoplastic disease: Secondary | ICD-10-CM | POA: Diagnosis not present

## 2023-06-16 DIAGNOSIS — I272 Pulmonary hypertension, unspecified: Secondary | ICD-10-CM | POA: Diagnosis not present

## 2023-06-16 DIAGNOSIS — R350 Frequency of micturition: Secondary | ICD-10-CM | POA: Diagnosis not present

## 2023-06-16 DIAGNOSIS — E785 Hyperlipidemia, unspecified: Secondary | ICD-10-CM | POA: Diagnosis not present

## 2023-06-16 DIAGNOSIS — Z8673 Personal history of transient ischemic attack (TIA), and cerebral infarction without residual deficits: Secondary | ICD-10-CM | POA: Diagnosis not present

## 2023-06-16 DIAGNOSIS — E669 Obesity, unspecified: Secondary | ICD-10-CM | POA: Diagnosis not present

## 2023-06-16 DIAGNOSIS — G4700 Insomnia, unspecified: Secondary | ICD-10-CM | POA: Diagnosis not present

## 2023-06-16 DIAGNOSIS — M8580 Other specified disorders of bone density and structure, unspecified site: Secondary | ICD-10-CM | POA: Diagnosis not present

## 2023-06-16 DIAGNOSIS — Z7982 Long term (current) use of aspirin: Secondary | ICD-10-CM | POA: Diagnosis not present

## 2023-06-16 DIAGNOSIS — I700 Atherosclerosis of aorta: Secondary | ICD-10-CM | POA: Diagnosis not present

## 2023-06-16 DIAGNOSIS — G4733 Obstructive sleep apnea (adult) (pediatric): Secondary | ICD-10-CM | POA: Diagnosis not present

## 2023-06-16 DIAGNOSIS — I10 Essential (primary) hypertension: Secondary | ICD-10-CM | POA: Diagnosis not present

## 2023-06-16 DIAGNOSIS — Z794 Long term (current) use of insulin: Secondary | ICD-10-CM | POA: Diagnosis not present

## 2023-06-16 DIAGNOSIS — E1151 Type 2 diabetes mellitus with diabetic peripheral angiopathy without gangrene: Secondary | ICD-10-CM | POA: Diagnosis not present

## 2023-06-16 DIAGNOSIS — C61 Malignant neoplasm of prostate: Secondary | ICD-10-CM | POA: Diagnosis not present

## 2023-06-16 DIAGNOSIS — N138 Other obstructive and reflux uropathy: Secondary | ICD-10-CM | POA: Diagnosis not present

## 2023-06-16 DIAGNOSIS — Z7984 Long term (current) use of oral hypoglycemic drugs: Secondary | ICD-10-CM | POA: Diagnosis not present

## 2023-06-16 DIAGNOSIS — M5412 Radiculopathy, cervical region: Secondary | ICD-10-CM | POA: Diagnosis not present

## 2023-06-16 DIAGNOSIS — Z6830 Body mass index (BMI) 30.0-30.9, adult: Secondary | ICD-10-CM | POA: Diagnosis not present

## 2023-06-16 DIAGNOSIS — M4802 Spinal stenosis, cervical region: Secondary | ICD-10-CM | POA: Diagnosis not present

## 2023-06-16 DIAGNOSIS — F329 Major depressive disorder, single episode, unspecified: Secondary | ICD-10-CM | POA: Diagnosis not present

## 2023-06-16 DIAGNOSIS — N401 Enlarged prostate with lower urinary tract symptoms: Secondary | ICD-10-CM | POA: Diagnosis not present

## 2023-06-16 DIAGNOSIS — I872 Venous insufficiency (chronic) (peripheral): Secondary | ICD-10-CM | POA: Diagnosis not present

## 2023-06-16 DIAGNOSIS — E1129 Type 2 diabetes mellitus with other diabetic kidney complication: Secondary | ICD-10-CM | POA: Diagnosis not present

## 2023-06-16 DIAGNOSIS — L89322 Pressure ulcer of left buttock, stage 2: Secondary | ICD-10-CM | POA: Diagnosis not present

## 2023-06-17 DIAGNOSIS — I272 Pulmonary hypertension, unspecified: Secondary | ICD-10-CM | POA: Diagnosis not present

## 2023-06-17 DIAGNOSIS — I872 Venous insufficiency (chronic) (peripheral): Secondary | ICD-10-CM | POA: Diagnosis not present

## 2023-06-17 DIAGNOSIS — I700 Atherosclerosis of aorta: Secondary | ICD-10-CM | POA: Diagnosis not present

## 2023-06-17 DIAGNOSIS — E1151 Type 2 diabetes mellitus with diabetic peripheral angiopathy without gangrene: Secondary | ICD-10-CM | POA: Diagnosis not present

## 2023-06-17 DIAGNOSIS — L89322 Pressure ulcer of left buttock, stage 2: Secondary | ICD-10-CM | POA: Diagnosis not present

## 2023-06-17 DIAGNOSIS — E1129 Type 2 diabetes mellitus with other diabetic kidney complication: Secondary | ICD-10-CM | POA: Diagnosis not present

## 2023-06-18 DIAGNOSIS — L89322 Pressure ulcer of left buttock, stage 2: Secondary | ICD-10-CM | POA: Diagnosis not present

## 2023-06-18 DIAGNOSIS — I272 Pulmonary hypertension, unspecified: Secondary | ICD-10-CM | POA: Diagnosis not present

## 2023-06-18 DIAGNOSIS — I700 Atherosclerosis of aorta: Secondary | ICD-10-CM | POA: Diagnosis not present

## 2023-06-18 DIAGNOSIS — E1129 Type 2 diabetes mellitus with other diabetic kidney complication: Secondary | ICD-10-CM | POA: Diagnosis not present

## 2023-06-18 DIAGNOSIS — E1151 Type 2 diabetes mellitus with diabetic peripheral angiopathy without gangrene: Secondary | ICD-10-CM | POA: Diagnosis not present

## 2023-06-18 DIAGNOSIS — I872 Venous insufficiency (chronic) (peripheral): Secondary | ICD-10-CM | POA: Diagnosis not present

## 2023-06-19 DIAGNOSIS — I872 Venous insufficiency (chronic) (peripheral): Secondary | ICD-10-CM | POA: Diagnosis not present

## 2023-06-19 DIAGNOSIS — L89322 Pressure ulcer of left buttock, stage 2: Secondary | ICD-10-CM | POA: Diagnosis not present

## 2023-06-19 DIAGNOSIS — I700 Atherosclerosis of aorta: Secondary | ICD-10-CM | POA: Diagnosis not present

## 2023-06-19 DIAGNOSIS — E1151 Type 2 diabetes mellitus with diabetic peripheral angiopathy without gangrene: Secondary | ICD-10-CM | POA: Diagnosis not present

## 2023-06-19 DIAGNOSIS — I272 Pulmonary hypertension, unspecified: Secondary | ICD-10-CM | POA: Diagnosis not present

## 2023-06-19 DIAGNOSIS — E1129 Type 2 diabetes mellitus with other diabetic kidney complication: Secondary | ICD-10-CM | POA: Diagnosis not present

## 2023-06-22 DIAGNOSIS — I700 Atherosclerosis of aorta: Secondary | ICD-10-CM | POA: Diagnosis not present

## 2023-06-22 DIAGNOSIS — L89322 Pressure ulcer of left buttock, stage 2: Secondary | ICD-10-CM | POA: Diagnosis not present

## 2023-06-22 DIAGNOSIS — E1151 Type 2 diabetes mellitus with diabetic peripheral angiopathy without gangrene: Secondary | ICD-10-CM | POA: Diagnosis not present

## 2023-06-22 DIAGNOSIS — I872 Venous insufficiency (chronic) (peripheral): Secondary | ICD-10-CM | POA: Diagnosis not present

## 2023-06-22 DIAGNOSIS — E1129 Type 2 diabetes mellitus with other diabetic kidney complication: Secondary | ICD-10-CM | POA: Diagnosis not present

## 2023-06-22 DIAGNOSIS — I272 Pulmonary hypertension, unspecified: Secondary | ICD-10-CM | POA: Diagnosis not present

## 2023-06-23 DIAGNOSIS — I872 Venous insufficiency (chronic) (peripheral): Secondary | ICD-10-CM | POA: Diagnosis not present

## 2023-06-23 DIAGNOSIS — I700 Atherosclerosis of aorta: Secondary | ICD-10-CM | POA: Diagnosis not present

## 2023-06-23 DIAGNOSIS — E1129 Type 2 diabetes mellitus with other diabetic kidney complication: Secondary | ICD-10-CM | POA: Diagnosis not present

## 2023-06-23 DIAGNOSIS — E1151 Type 2 diabetes mellitus with diabetic peripheral angiopathy without gangrene: Secondary | ICD-10-CM | POA: Diagnosis not present

## 2023-06-23 DIAGNOSIS — I272 Pulmonary hypertension, unspecified: Secondary | ICD-10-CM | POA: Diagnosis not present

## 2023-06-23 DIAGNOSIS — L89322 Pressure ulcer of left buttock, stage 2: Secondary | ICD-10-CM | POA: Diagnosis not present

## 2023-06-25 DIAGNOSIS — D630 Anemia in neoplastic disease: Secondary | ICD-10-CM | POA: Diagnosis not present

## 2023-06-25 DIAGNOSIS — I872 Venous insufficiency (chronic) (peripheral): Secondary | ICD-10-CM | POA: Diagnosis not present

## 2023-06-25 DIAGNOSIS — C61 Malignant neoplasm of prostate: Secondary | ICD-10-CM | POA: Diagnosis not present

## 2023-06-25 DIAGNOSIS — Z794 Long term (current) use of insulin: Secondary | ICD-10-CM | POA: Diagnosis not present

## 2023-06-25 DIAGNOSIS — E785 Hyperlipidemia, unspecified: Secondary | ICD-10-CM | POA: Diagnosis not present

## 2023-06-25 DIAGNOSIS — E1129 Type 2 diabetes mellitus with other diabetic kidney complication: Secondary | ICD-10-CM | POA: Diagnosis not present

## 2023-06-25 DIAGNOSIS — I272 Pulmonary hypertension, unspecified: Secondary | ICD-10-CM | POA: Diagnosis not present

## 2023-06-25 DIAGNOSIS — L89322 Pressure ulcer of left buttock, stage 2: Secondary | ICD-10-CM | POA: Diagnosis not present

## 2023-06-25 DIAGNOSIS — M4802 Spinal stenosis, cervical region: Secondary | ICD-10-CM | POA: Diagnosis not present

## 2023-06-25 DIAGNOSIS — I700 Atherosclerosis of aorta: Secondary | ICD-10-CM | POA: Diagnosis not present

## 2023-06-25 DIAGNOSIS — F329 Major depressive disorder, single episode, unspecified: Secondary | ICD-10-CM | POA: Diagnosis not present

## 2023-06-25 DIAGNOSIS — E1151 Type 2 diabetes mellitus with diabetic peripheral angiopathy without gangrene: Secondary | ICD-10-CM | POA: Diagnosis not present

## 2023-06-26 ENCOUNTER — Ambulatory Visit (HOSPITAL_COMMUNITY)
Admission: RE | Admit: 2023-06-26 | Discharge: 2023-06-26 | Disposition: A | Discharge status: Home / Self Care | Payer: Medicare Other | Source: Ambulatory Visit | Attending: Family Medicine | Admitting: Family Medicine

## 2023-06-26 DIAGNOSIS — I700 Atherosclerosis of aorta: Secondary | ICD-10-CM | POA: Diagnosis not present

## 2023-06-26 DIAGNOSIS — I639 Cerebral infarction, unspecified: Secondary | ICD-10-CM

## 2023-06-26 DIAGNOSIS — E1151 Type 2 diabetes mellitus with diabetic peripheral angiopathy without gangrene: Secondary | ICD-10-CM | POA: Diagnosis not present

## 2023-06-26 DIAGNOSIS — L89322 Pressure ulcer of left buttock, stage 2: Secondary | ICD-10-CM | POA: Diagnosis not present

## 2023-06-26 DIAGNOSIS — I272 Pulmonary hypertension, unspecified: Secondary | ICD-10-CM | POA: Diagnosis not present

## 2023-06-26 DIAGNOSIS — E1129 Type 2 diabetes mellitus with other diabetic kidney complication: Secondary | ICD-10-CM | POA: Diagnosis not present

## 2023-06-26 DIAGNOSIS — I872 Venous insufficiency (chronic) (peripheral): Secondary | ICD-10-CM | POA: Diagnosis not present

## 2023-06-26 NOTE — Progress Notes (Signed)
TCD with bubbles has been completed. Preliminary results can be found in CV Proc through chart review.   06/26/23 4:01 PM Olen Cordial RVT

## 2023-06-29 DIAGNOSIS — I700 Atherosclerosis of aorta: Secondary | ICD-10-CM | POA: Diagnosis not present

## 2023-06-29 DIAGNOSIS — L89322 Pressure ulcer of left buttock, stage 2: Secondary | ICD-10-CM | POA: Diagnosis not present

## 2023-06-29 DIAGNOSIS — I272 Pulmonary hypertension, unspecified: Secondary | ICD-10-CM | POA: Diagnosis not present

## 2023-06-29 DIAGNOSIS — I872 Venous insufficiency (chronic) (peripheral): Secondary | ICD-10-CM | POA: Diagnosis not present

## 2023-06-29 DIAGNOSIS — E1129 Type 2 diabetes mellitus with other diabetic kidney complication: Secondary | ICD-10-CM | POA: Diagnosis not present

## 2023-06-29 DIAGNOSIS — E1151 Type 2 diabetes mellitus with diabetic peripheral angiopathy without gangrene: Secondary | ICD-10-CM | POA: Diagnosis not present

## 2023-06-30 DIAGNOSIS — L89322 Pressure ulcer of left buttock, stage 2: Secondary | ICD-10-CM | POA: Diagnosis not present

## 2023-06-30 DIAGNOSIS — I700 Atherosclerosis of aorta: Secondary | ICD-10-CM | POA: Diagnosis not present

## 2023-06-30 DIAGNOSIS — I272 Pulmonary hypertension, unspecified: Secondary | ICD-10-CM | POA: Diagnosis not present

## 2023-06-30 DIAGNOSIS — E1129 Type 2 diabetes mellitus with other diabetic kidney complication: Secondary | ICD-10-CM | POA: Diagnosis not present

## 2023-06-30 DIAGNOSIS — E1151 Type 2 diabetes mellitus with diabetic peripheral angiopathy without gangrene: Secondary | ICD-10-CM | POA: Diagnosis not present

## 2023-06-30 DIAGNOSIS — I872 Venous insufficiency (chronic) (peripheral): Secondary | ICD-10-CM | POA: Diagnosis not present

## 2023-07-01 DIAGNOSIS — I272 Pulmonary hypertension, unspecified: Secondary | ICD-10-CM | POA: Diagnosis not present

## 2023-07-01 DIAGNOSIS — I872 Venous insufficiency (chronic) (peripheral): Secondary | ICD-10-CM | POA: Diagnosis not present

## 2023-07-01 DIAGNOSIS — L89322 Pressure ulcer of left buttock, stage 2: Secondary | ICD-10-CM | POA: Diagnosis not present

## 2023-07-01 DIAGNOSIS — E1151 Type 2 diabetes mellitus with diabetic peripheral angiopathy without gangrene: Secondary | ICD-10-CM | POA: Diagnosis not present

## 2023-07-01 DIAGNOSIS — E1129 Type 2 diabetes mellitus with other diabetic kidney complication: Secondary | ICD-10-CM | POA: Diagnosis not present

## 2023-07-01 DIAGNOSIS — I700 Atherosclerosis of aorta: Secondary | ICD-10-CM | POA: Diagnosis not present

## 2023-07-02 ENCOUNTER — Telehealth: Payer: Self-pay | Admitting: *Deleted

## 2023-07-02 DIAGNOSIS — E1129 Type 2 diabetes mellitus with other diabetic kidney complication: Secondary | ICD-10-CM | POA: Diagnosis not present

## 2023-07-02 DIAGNOSIS — I700 Atherosclerosis of aorta: Secondary | ICD-10-CM | POA: Diagnosis not present

## 2023-07-02 DIAGNOSIS — I272 Pulmonary hypertension, unspecified: Secondary | ICD-10-CM | POA: Diagnosis not present

## 2023-07-02 DIAGNOSIS — L89322 Pressure ulcer of left buttock, stage 2: Secondary | ICD-10-CM | POA: Diagnosis not present

## 2023-07-02 DIAGNOSIS — I872 Venous insufficiency (chronic) (peripheral): Secondary | ICD-10-CM | POA: Diagnosis not present

## 2023-07-02 DIAGNOSIS — E1151 Type 2 diabetes mellitus with diabetic peripheral angiopathy without gangrene: Secondary | ICD-10-CM | POA: Diagnosis not present

## 2023-07-02 NOTE — Telephone Encounter (Signed)
-----   Message from Shawnie Dapper sent at 07/02/2023  4:33 PM EST ----- Can you guys please call the patient with TCD results. Let him know that there was a very small, clinically insignificant PFO. There is very little to no risk of stroke from this and it should not need any further management. I wanted to verify that he is currently taking Brilinta 90mg  BID? From a stroke perspective, he can switch back to Plavix daily but I know his hematologist and PCP were considering Eliquis. If he can afford Brilinta and is tolerating it well, he can continue it but he may want to talk to PCP about switching back to Plavix if having any trouble getting Brilinta. TY!

## 2023-07-02 NOTE — Telephone Encounter (Signed)
Called and spoke w/ pt. Relayed results per Amy's note. He verbalized understanding. He did confirm he is taking Brillinta 90mg  BID. Able to get with no issues. At this point of conversation, he asked to end call d/t needing to use bathroom. Aware we will call back Monday to wrap up conversation.

## 2023-07-06 DIAGNOSIS — L89322 Pressure ulcer of left buttock, stage 2: Secondary | ICD-10-CM | POA: Diagnosis not present

## 2023-07-06 DIAGNOSIS — I272 Pulmonary hypertension, unspecified: Secondary | ICD-10-CM | POA: Diagnosis not present

## 2023-07-06 DIAGNOSIS — I872 Venous insufficiency (chronic) (peripheral): Secondary | ICD-10-CM | POA: Diagnosis not present

## 2023-07-06 DIAGNOSIS — E1129 Type 2 diabetes mellitus with other diabetic kidney complication: Secondary | ICD-10-CM | POA: Diagnosis not present

## 2023-07-06 DIAGNOSIS — E1151 Type 2 diabetes mellitus with diabetic peripheral angiopathy without gangrene: Secondary | ICD-10-CM | POA: Diagnosis not present

## 2023-07-06 DIAGNOSIS — I700 Atherosclerosis of aorta: Secondary | ICD-10-CM | POA: Diagnosis not present

## 2023-07-07 DIAGNOSIS — I700 Atherosclerosis of aorta: Secondary | ICD-10-CM | POA: Diagnosis not present

## 2023-07-07 DIAGNOSIS — L89322 Pressure ulcer of left buttock, stage 2: Secondary | ICD-10-CM | POA: Diagnosis not present

## 2023-07-07 DIAGNOSIS — E1129 Type 2 diabetes mellitus with other diabetic kidney complication: Secondary | ICD-10-CM | POA: Diagnosis not present

## 2023-07-07 DIAGNOSIS — E1151 Type 2 diabetes mellitus with diabetic peripheral angiopathy without gangrene: Secondary | ICD-10-CM | POA: Diagnosis not present

## 2023-07-07 DIAGNOSIS — I872 Venous insufficiency (chronic) (peripheral): Secondary | ICD-10-CM | POA: Diagnosis not present

## 2023-07-07 DIAGNOSIS — I272 Pulmonary hypertension, unspecified: Secondary | ICD-10-CM | POA: Diagnosis not present

## 2023-07-10 DIAGNOSIS — G4700 Insomnia, unspecified: Secondary | ICD-10-CM | POA: Diagnosis not present

## 2023-07-10 DIAGNOSIS — F329 Major depressive disorder, single episode, unspecified: Secondary | ICD-10-CM | POA: Diagnosis not present

## 2023-07-10 DIAGNOSIS — R2689 Other abnormalities of gait and mobility: Secondary | ICD-10-CM | POA: Diagnosis not present

## 2023-07-10 DIAGNOSIS — E1129 Type 2 diabetes mellitus with other diabetic kidney complication: Secondary | ICD-10-CM | POA: Diagnosis not present

## 2023-07-10 DIAGNOSIS — M6281 Muscle weakness (generalized): Secondary | ICD-10-CM | POA: Diagnosis not present

## 2023-07-10 DIAGNOSIS — I872 Venous insufficiency (chronic) (peripheral): Secondary | ICD-10-CM | POA: Diagnosis not present

## 2023-07-10 DIAGNOSIS — F419 Anxiety disorder, unspecified: Secondary | ICD-10-CM | POA: Diagnosis not present

## 2023-07-10 DIAGNOSIS — E1151 Type 2 diabetes mellitus with diabetic peripheral angiopathy without gangrene: Secondary | ICD-10-CM | POA: Diagnosis not present

## 2023-07-10 DIAGNOSIS — I700 Atherosclerosis of aorta: Secondary | ICD-10-CM | POA: Diagnosis not present

## 2023-07-10 DIAGNOSIS — I272 Pulmonary hypertension, unspecified: Secondary | ICD-10-CM | POA: Diagnosis not present

## 2023-07-10 DIAGNOSIS — L89322 Pressure ulcer of left buttock, stage 2: Secondary | ICD-10-CM | POA: Diagnosis not present

## 2023-07-13 ENCOUNTER — Ambulatory Visit: Payer: Medicare Other

## 2023-07-13 DIAGNOSIS — I639 Cerebral infarction, unspecified: Secondary | ICD-10-CM

## 2023-07-14 DIAGNOSIS — L89322 Pressure ulcer of left buttock, stage 2: Secondary | ICD-10-CM | POA: Diagnosis not present

## 2023-07-14 DIAGNOSIS — I700 Atherosclerosis of aorta: Secondary | ICD-10-CM | POA: Diagnosis not present

## 2023-07-14 DIAGNOSIS — E1129 Type 2 diabetes mellitus with other diabetic kidney complication: Secondary | ICD-10-CM | POA: Diagnosis not present

## 2023-07-14 DIAGNOSIS — I272 Pulmonary hypertension, unspecified: Secondary | ICD-10-CM | POA: Diagnosis not present

## 2023-07-14 DIAGNOSIS — I872 Venous insufficiency (chronic) (peripheral): Secondary | ICD-10-CM | POA: Diagnosis not present

## 2023-07-14 DIAGNOSIS — E1151 Type 2 diabetes mellitus with diabetic peripheral angiopathy without gangrene: Secondary | ICD-10-CM | POA: Diagnosis not present

## 2023-07-15 DIAGNOSIS — E1151 Type 2 diabetes mellitus with diabetic peripheral angiopathy without gangrene: Secondary | ICD-10-CM | POA: Diagnosis not present

## 2023-07-15 DIAGNOSIS — E1129 Type 2 diabetes mellitus with other diabetic kidney complication: Secondary | ICD-10-CM | POA: Diagnosis not present

## 2023-07-15 DIAGNOSIS — I872 Venous insufficiency (chronic) (peripheral): Secondary | ICD-10-CM | POA: Diagnosis not present

## 2023-07-15 DIAGNOSIS — I272 Pulmonary hypertension, unspecified: Secondary | ICD-10-CM | POA: Diagnosis not present

## 2023-07-15 DIAGNOSIS — I700 Atherosclerosis of aorta: Secondary | ICD-10-CM | POA: Diagnosis not present

## 2023-07-15 DIAGNOSIS — L89322 Pressure ulcer of left buttock, stage 2: Secondary | ICD-10-CM | POA: Diagnosis not present

## 2023-07-15 LAB — CUP PACEART REMOTE DEVICE CHECK: Date Time Interrogation Session: 20250302231655

## 2023-07-16 DIAGNOSIS — N401 Enlarged prostate with lower urinary tract symptoms: Secondary | ICD-10-CM | POA: Diagnosis not present

## 2023-07-16 DIAGNOSIS — M8580 Other specified disorders of bone density and structure, unspecified site: Secondary | ICD-10-CM | POA: Diagnosis not present

## 2023-07-16 DIAGNOSIS — N138 Other obstructive and reflux uropathy: Secondary | ICD-10-CM | POA: Diagnosis not present

## 2023-07-16 DIAGNOSIS — I10 Essential (primary) hypertension: Secondary | ICD-10-CM | POA: Diagnosis not present

## 2023-07-16 DIAGNOSIS — R350 Frequency of micturition: Secondary | ICD-10-CM | POA: Diagnosis not present

## 2023-07-16 DIAGNOSIS — I272 Pulmonary hypertension, unspecified: Secondary | ICD-10-CM | POA: Diagnosis not present

## 2023-07-16 DIAGNOSIS — Z7984 Long term (current) use of oral hypoglycemic drugs: Secondary | ICD-10-CM | POA: Diagnosis not present

## 2023-07-16 DIAGNOSIS — E669 Obesity, unspecified: Secondary | ICD-10-CM | POA: Diagnosis not present

## 2023-07-16 DIAGNOSIS — Z7982 Long term (current) use of aspirin: Secondary | ICD-10-CM | POA: Diagnosis not present

## 2023-07-16 DIAGNOSIS — Z8673 Personal history of transient ischemic attack (TIA), and cerebral infarction without residual deficits: Secondary | ICD-10-CM | POA: Diagnosis not present

## 2023-07-16 DIAGNOSIS — E1151 Type 2 diabetes mellitus with diabetic peripheral angiopathy without gangrene: Secondary | ICD-10-CM | POA: Diagnosis not present

## 2023-07-16 DIAGNOSIS — L89322 Pressure ulcer of left buttock, stage 2: Secondary | ICD-10-CM | POA: Diagnosis not present

## 2023-07-16 DIAGNOSIS — M5412 Radiculopathy, cervical region: Secondary | ICD-10-CM | POA: Diagnosis not present

## 2023-07-16 DIAGNOSIS — C61 Malignant neoplasm of prostate: Secondary | ICD-10-CM | POA: Diagnosis not present

## 2023-07-16 DIAGNOSIS — E1129 Type 2 diabetes mellitus with other diabetic kidney complication: Secondary | ICD-10-CM | POA: Diagnosis not present

## 2023-07-16 DIAGNOSIS — Z794 Long term (current) use of insulin: Secondary | ICD-10-CM | POA: Diagnosis not present

## 2023-07-16 DIAGNOSIS — G4700 Insomnia, unspecified: Secondary | ICD-10-CM | POA: Diagnosis not present

## 2023-07-16 DIAGNOSIS — D630 Anemia in neoplastic disease: Secondary | ICD-10-CM | POA: Diagnosis not present

## 2023-07-16 DIAGNOSIS — I872 Venous insufficiency (chronic) (peripheral): Secondary | ICD-10-CM | POA: Diagnosis not present

## 2023-07-16 DIAGNOSIS — F329 Major depressive disorder, single episode, unspecified: Secondary | ICD-10-CM | POA: Diagnosis not present

## 2023-07-16 DIAGNOSIS — I700 Atherosclerosis of aorta: Secondary | ICD-10-CM | POA: Diagnosis not present

## 2023-07-16 DIAGNOSIS — Z6830 Body mass index (BMI) 30.0-30.9, adult: Secondary | ICD-10-CM | POA: Diagnosis not present

## 2023-07-16 DIAGNOSIS — E785 Hyperlipidemia, unspecified: Secondary | ICD-10-CM | POA: Diagnosis not present

## 2023-07-16 DIAGNOSIS — G4733 Obstructive sleep apnea (adult) (pediatric): Secondary | ICD-10-CM | POA: Diagnosis not present

## 2023-07-16 DIAGNOSIS — M4802 Spinal stenosis, cervical region: Secondary | ICD-10-CM | POA: Diagnosis not present

## 2023-07-17 DIAGNOSIS — I272 Pulmonary hypertension, unspecified: Secondary | ICD-10-CM | POA: Diagnosis not present

## 2023-07-17 DIAGNOSIS — L89322 Pressure ulcer of left buttock, stage 2: Secondary | ICD-10-CM | POA: Diagnosis not present

## 2023-07-17 DIAGNOSIS — I700 Atherosclerosis of aorta: Secondary | ICD-10-CM | POA: Diagnosis not present

## 2023-07-17 DIAGNOSIS — E1129 Type 2 diabetes mellitus with other diabetic kidney complication: Secondary | ICD-10-CM | POA: Diagnosis not present

## 2023-07-17 DIAGNOSIS — E1151 Type 2 diabetes mellitus with diabetic peripheral angiopathy without gangrene: Secondary | ICD-10-CM | POA: Diagnosis not present

## 2023-07-17 DIAGNOSIS — I872 Venous insufficiency (chronic) (peripheral): Secondary | ICD-10-CM | POA: Diagnosis not present

## 2023-07-17 NOTE — Progress Notes (Signed)
 [facility] Summary Report / Loop Recorder

## 2023-07-21 DIAGNOSIS — E1151 Type 2 diabetes mellitus with diabetic peripheral angiopathy without gangrene: Secondary | ICD-10-CM | POA: Diagnosis not present

## 2023-07-21 DIAGNOSIS — I272 Pulmonary hypertension, unspecified: Secondary | ICD-10-CM | POA: Diagnosis not present

## 2023-07-21 DIAGNOSIS — I872 Venous insufficiency (chronic) (peripheral): Secondary | ICD-10-CM | POA: Diagnosis not present

## 2023-07-21 DIAGNOSIS — E1129 Type 2 diabetes mellitus with other diabetic kidney complication: Secondary | ICD-10-CM | POA: Diagnosis not present

## 2023-07-21 DIAGNOSIS — I700 Atherosclerosis of aorta: Secondary | ICD-10-CM | POA: Diagnosis not present

## 2023-07-21 DIAGNOSIS — L89322 Pressure ulcer of left buttock, stage 2: Secondary | ICD-10-CM | POA: Diagnosis not present

## 2023-07-22 DIAGNOSIS — E1151 Type 2 diabetes mellitus with diabetic peripheral angiopathy without gangrene: Secondary | ICD-10-CM | POA: Diagnosis not present

## 2023-07-22 DIAGNOSIS — I872 Venous insufficiency (chronic) (peripheral): Secondary | ICD-10-CM | POA: Diagnosis not present

## 2023-07-22 DIAGNOSIS — L89322 Pressure ulcer of left buttock, stage 2: Secondary | ICD-10-CM | POA: Diagnosis not present

## 2023-07-22 DIAGNOSIS — I700 Atherosclerosis of aorta: Secondary | ICD-10-CM | POA: Diagnosis not present

## 2023-07-22 DIAGNOSIS — I272 Pulmonary hypertension, unspecified: Secondary | ICD-10-CM | POA: Diagnosis not present

## 2023-07-22 DIAGNOSIS — E1129 Type 2 diabetes mellitus with other diabetic kidney complication: Secondary | ICD-10-CM | POA: Diagnosis not present

## 2023-07-24 DIAGNOSIS — S93401A Sprain of unspecified ligament of right ankle, initial encounter: Secondary | ICD-10-CM | POA: Diagnosis not present

## 2023-07-24 DIAGNOSIS — M25571 Pain in right ankle and joints of right foot: Secondary | ICD-10-CM | POA: Diagnosis not present

## 2023-07-24 DIAGNOSIS — W1849XA Other slipping, tripping and stumbling without falling, initial encounter: Secondary | ICD-10-CM | POA: Diagnosis not present

## 2023-07-24 DIAGNOSIS — M7918 Myalgia, other site: Secondary | ICD-10-CM | POA: Diagnosis not present

## 2023-07-28 DIAGNOSIS — E1129 Type 2 diabetes mellitus with other diabetic kidney complication: Secondary | ICD-10-CM | POA: Diagnosis not present

## 2023-07-28 DIAGNOSIS — I700 Atherosclerosis of aorta: Secondary | ICD-10-CM | POA: Diagnosis not present

## 2023-07-28 DIAGNOSIS — L89322 Pressure ulcer of left buttock, stage 2: Secondary | ICD-10-CM | POA: Diagnosis not present

## 2023-07-28 DIAGNOSIS — I272 Pulmonary hypertension, unspecified: Secondary | ICD-10-CM | POA: Diagnosis not present

## 2023-07-28 DIAGNOSIS — I872 Venous insufficiency (chronic) (peripheral): Secondary | ICD-10-CM | POA: Diagnosis not present

## 2023-07-28 DIAGNOSIS — E1151 Type 2 diabetes mellitus with diabetic peripheral angiopathy without gangrene: Secondary | ICD-10-CM | POA: Diagnosis not present

## 2023-07-29 DIAGNOSIS — I700 Atherosclerosis of aorta: Secondary | ICD-10-CM | POA: Diagnosis not present

## 2023-07-29 DIAGNOSIS — I272 Pulmonary hypertension, unspecified: Secondary | ICD-10-CM | POA: Diagnosis not present

## 2023-07-29 DIAGNOSIS — E1151 Type 2 diabetes mellitus with diabetic peripheral angiopathy without gangrene: Secondary | ICD-10-CM | POA: Diagnosis not present

## 2023-07-29 DIAGNOSIS — E1129 Type 2 diabetes mellitus with other diabetic kidney complication: Secondary | ICD-10-CM | POA: Diagnosis not present

## 2023-07-29 DIAGNOSIS — I872 Venous insufficiency (chronic) (peripheral): Secondary | ICD-10-CM | POA: Diagnosis not present

## 2023-07-29 DIAGNOSIS — L89322 Pressure ulcer of left buttock, stage 2: Secondary | ICD-10-CM | POA: Diagnosis not present

## 2023-07-30 DIAGNOSIS — E785 Hyperlipidemia, unspecified: Secondary | ICD-10-CM | POA: Diagnosis not present

## 2023-07-30 DIAGNOSIS — E1129 Type 2 diabetes mellitus with other diabetic kidney complication: Secondary | ICD-10-CM | POA: Diagnosis not present

## 2023-07-30 DIAGNOSIS — I10 Essential (primary) hypertension: Secondary | ICD-10-CM | POA: Diagnosis not present

## 2023-07-30 DIAGNOSIS — Z794 Long term (current) use of insulin: Secondary | ICD-10-CM | POA: Diagnosis not present

## 2023-07-30 DIAGNOSIS — F329 Major depressive disorder, single episode, unspecified: Secondary | ICD-10-CM | POA: Diagnosis not present

## 2023-08-01 ENCOUNTER — Other Ambulatory Visit: Payer: Self-pay

## 2023-08-01 ENCOUNTER — Emergency Department (HOSPITAL_COMMUNITY)

## 2023-08-01 ENCOUNTER — Inpatient Hospital Stay (HOSPITAL_COMMUNITY)
Admission: EM | Admit: 2023-08-01 | Discharge: 2023-09-10 | DRG: 811 | Disposition: E | Attending: Internal Medicine | Admitting: Internal Medicine

## 2023-08-01 ENCOUNTER — Encounter (HOSPITAL_COMMUNITY): Payer: Self-pay | Admitting: Emergency Medicine

## 2023-08-01 DIAGNOSIS — A411 Sepsis due to other specified staphylococcus: Secondary | ICD-10-CM | POA: Diagnosis not present

## 2023-08-01 DIAGNOSIS — Z860100 Personal history of colon polyps, unspecified: Secondary | ICD-10-CM

## 2023-08-01 DIAGNOSIS — E860 Dehydration: Principal | ICD-10-CM | POA: Diagnosis present

## 2023-08-01 DIAGNOSIS — J9601 Acute respiratory failure with hypoxia: Secondary | ICD-10-CM | POA: Diagnosis not present

## 2023-08-01 DIAGNOSIS — K31A11 Gastric intestinal metaplasia without dysplasia, involving the antrum: Secondary | ICD-10-CM | POA: Diagnosis not present

## 2023-08-01 DIAGNOSIS — H9193 Unspecified hearing loss, bilateral: Secondary | ICD-10-CM | POA: Diagnosis present

## 2023-08-01 DIAGNOSIS — G9340 Encephalopathy, unspecified: Secondary | ICD-10-CM | POA: Diagnosis not present

## 2023-08-01 DIAGNOSIS — I4891 Unspecified atrial fibrillation: Secondary | ICD-10-CM | POA: Diagnosis not present

## 2023-08-01 DIAGNOSIS — E041 Nontoxic single thyroid nodule: Secondary | ICD-10-CM | POA: Diagnosis present

## 2023-08-01 DIAGNOSIS — Z7984 Long term (current) use of oral hypoglycemic drugs: Secondary | ICD-10-CM

## 2023-08-01 DIAGNOSIS — K828 Other specified diseases of gallbladder: Secondary | ICD-10-CM | POA: Diagnosis not present

## 2023-08-01 DIAGNOSIS — R579 Shock, unspecified: Secondary | ICD-10-CM | POA: Diagnosis not present

## 2023-08-01 DIAGNOSIS — I63423 Cerebral infarction due to embolism of bilateral anterior cerebral arteries: Secondary | ICD-10-CM | POA: Diagnosis not present

## 2023-08-01 DIAGNOSIS — G9341 Metabolic encephalopathy: Secondary | ICD-10-CM | POA: Diagnosis not present

## 2023-08-01 DIAGNOSIS — Z7982 Long term (current) use of aspirin: Secondary | ICD-10-CM

## 2023-08-01 DIAGNOSIS — Z1152 Encounter for screening for COVID-19: Secondary | ICD-10-CM | POA: Diagnosis not present

## 2023-08-01 DIAGNOSIS — N400 Enlarged prostate without lower urinary tract symptoms: Secondary | ICD-10-CM | POA: Diagnosis present

## 2023-08-01 DIAGNOSIS — D500 Iron deficiency anemia secondary to blood loss (chronic): Secondary | ICD-10-CM | POA: Diagnosis present

## 2023-08-01 DIAGNOSIS — Z9181 History of falling: Secondary | ICD-10-CM

## 2023-08-01 DIAGNOSIS — D638 Anemia in other chronic diseases classified elsewhere: Secondary | ICD-10-CM | POA: Diagnosis not present

## 2023-08-01 DIAGNOSIS — I480 Paroxysmal atrial fibrillation: Secondary | ICD-10-CM | POA: Diagnosis not present

## 2023-08-01 DIAGNOSIS — I952 Hypotension due to drugs: Secondary | ICD-10-CM | POA: Diagnosis not present

## 2023-08-01 DIAGNOSIS — F32A Depression, unspecified: Secondary | ICD-10-CM | POA: Diagnosis present

## 2023-08-01 DIAGNOSIS — J9811 Atelectasis: Secondary | ICD-10-CM | POA: Diagnosis not present

## 2023-08-01 DIAGNOSIS — K2970 Gastritis, unspecified, without bleeding: Secondary | ICD-10-CM | POA: Diagnosis present

## 2023-08-01 DIAGNOSIS — I959 Hypotension, unspecified: Secondary | ICD-10-CM | POA: Diagnosis not present

## 2023-08-01 DIAGNOSIS — R54 Age-related physical debility: Secondary | ICD-10-CM | POA: Diagnosis present

## 2023-08-01 DIAGNOSIS — Z7902 Long term (current) use of antithrombotics/antiplatelets: Secondary | ICD-10-CM

## 2023-08-01 DIAGNOSIS — J929 Pleural plaque without asbestos: Secondary | ICD-10-CM | POA: Diagnosis not present

## 2023-08-01 DIAGNOSIS — Z66 Do not resuscitate: Secondary | ICD-10-CM | POA: Diagnosis not present

## 2023-08-01 DIAGNOSIS — M4802 Spinal stenosis, cervical region: Secondary | ICD-10-CM | POA: Diagnosis present

## 2023-08-01 DIAGNOSIS — R4182 Altered mental status, unspecified: Secondary | ICD-10-CM | POA: Diagnosis not present

## 2023-08-01 DIAGNOSIS — R7989 Other specified abnormal findings of blood chemistry: Secondary | ICD-10-CM | POA: Diagnosis not present

## 2023-08-01 DIAGNOSIS — D649 Anemia, unspecified: Secondary | ICD-10-CM | POA: Diagnosis not present

## 2023-08-01 DIAGNOSIS — I639 Cerebral infarction, unspecified: Secondary | ICD-10-CM | POA: Diagnosis not present

## 2023-08-01 DIAGNOSIS — I701 Atherosclerosis of renal artery: Secondary | ICD-10-CM | POA: Diagnosis not present

## 2023-08-01 DIAGNOSIS — Z981 Arthrodesis status: Secondary | ICD-10-CM

## 2023-08-01 DIAGNOSIS — E785 Hyperlipidemia, unspecified: Secondary | ICD-10-CM | POA: Diagnosis not present

## 2023-08-01 DIAGNOSIS — K7200 Acute and subacute hepatic failure without coma: Secondary | ICD-10-CM | POA: Diagnosis not present

## 2023-08-01 DIAGNOSIS — E1165 Type 2 diabetes mellitus with hyperglycemia: Secondary | ICD-10-CM | POA: Diagnosis not present

## 2023-08-01 DIAGNOSIS — I10 Essential (primary) hypertension: Secondary | ICD-10-CM | POA: Diagnosis not present

## 2023-08-01 DIAGNOSIS — R790 Abnormal level of blood mineral: Secondary | ICD-10-CM | POA: Diagnosis not present

## 2023-08-01 DIAGNOSIS — R0602 Shortness of breath: Secondary | ICD-10-CM | POA: Diagnosis not present

## 2023-08-01 DIAGNOSIS — R142 Eructation: Secondary | ICD-10-CM

## 2023-08-01 DIAGNOSIS — Q272 Other congenital malformations of renal artery: Secondary | ICD-10-CM | POA: Diagnosis not present

## 2023-08-01 DIAGNOSIS — R6521 Severe sepsis with septic shock: Secondary | ICD-10-CM | POA: Diagnosis not present

## 2023-08-01 DIAGNOSIS — L89326 Pressure-induced deep tissue damage of left buttock: Secondary | ICD-10-CM | POA: Diagnosis not present

## 2023-08-01 DIAGNOSIS — Z9911 Dependence on respirator [ventilator] status: Secondary | ICD-10-CM

## 2023-08-01 DIAGNOSIS — I5032 Chronic diastolic (congestive) heart failure: Secondary | ICD-10-CM | POA: Diagnosis present

## 2023-08-01 DIAGNOSIS — Z888 Allergy status to other drugs, medicaments and biological substances status: Secondary | ICD-10-CM

## 2023-08-01 DIAGNOSIS — D6859 Other primary thrombophilia: Secondary | ICD-10-CM

## 2023-08-01 DIAGNOSIS — I110 Hypertensive heart disease with heart failure: Secondary | ICD-10-CM | POA: Diagnosis present

## 2023-08-01 DIAGNOSIS — Z880 Allergy status to penicillin: Secondary | ICD-10-CM

## 2023-08-01 DIAGNOSIS — R578 Other shock: Secondary | ICD-10-CM | POA: Diagnosis not present

## 2023-08-01 DIAGNOSIS — I872 Venous insufficiency (chronic) (peripheral): Secondary | ICD-10-CM | POA: Diagnosis not present

## 2023-08-01 DIAGNOSIS — Z6832 Body mass index (BMI) 32.0-32.9, adult: Secondary | ICD-10-CM

## 2023-08-01 DIAGNOSIS — Z7189 Other specified counseling: Secondary | ICD-10-CM | POA: Diagnosis not present

## 2023-08-01 DIAGNOSIS — K3189 Other diseases of stomach and duodenum: Secondary | ICD-10-CM | POA: Diagnosis not present

## 2023-08-01 DIAGNOSIS — D6851 Activated protein C resistance: Secondary | ICD-10-CM | POA: Diagnosis not present

## 2023-08-01 DIAGNOSIS — Z800 Family history of malignant neoplasm of digestive organs: Secondary | ICD-10-CM

## 2023-08-01 DIAGNOSIS — R221 Localized swelling, mass and lump, neck: Secondary | ICD-10-CM | POA: Diagnosis not present

## 2023-08-01 DIAGNOSIS — D7389 Other diseases of spleen: Secondary | ICD-10-CM | POA: Diagnosis not present

## 2023-08-01 DIAGNOSIS — N179 Acute kidney failure, unspecified: Secondary | ICD-10-CM | POA: Diagnosis not present

## 2023-08-01 DIAGNOSIS — Z01810 Encounter for preprocedural cardiovascular examination: Secondary | ICD-10-CM

## 2023-08-01 DIAGNOSIS — Z8673 Personal history of transient ischemic attack (TIA), and cerebral infarction without residual deficits: Secondary | ICD-10-CM | POA: Diagnosis not present

## 2023-08-01 DIAGNOSIS — D509 Iron deficiency anemia, unspecified: Secondary | ICD-10-CM | POA: Diagnosis not present

## 2023-08-01 DIAGNOSIS — R932 Abnormal findings on diagnostic imaging of liver and biliary tract: Secondary | ICD-10-CM | POA: Diagnosis not present

## 2023-08-01 DIAGNOSIS — J1529 Pneumonia due to other staphylococcus: Secondary | ICD-10-CM | POA: Diagnosis not present

## 2023-08-01 DIAGNOSIS — Z79899 Other long term (current) drug therapy: Secondary | ICD-10-CM

## 2023-08-01 DIAGNOSIS — E222 Syndrome of inappropriate secretion of antidiuretic hormone: Secondary | ICD-10-CM | POA: Diagnosis not present

## 2023-08-01 DIAGNOSIS — R0989 Other specified symptoms and signs involving the circulatory and respiratory systems: Secondary | ICD-10-CM | POA: Diagnosis not present

## 2023-08-01 DIAGNOSIS — G992 Myelopathy in diseases classified elsewhere: Secondary | ICD-10-CM | POA: Diagnosis present

## 2023-08-01 DIAGNOSIS — K2990 Gastroduodenitis, unspecified, without bleeding: Secondary | ICD-10-CM | POA: Diagnosis not present

## 2023-08-01 DIAGNOSIS — E1129 Type 2 diabetes mellitus with other diabetic kidney complication: Secondary | ICD-10-CM | POA: Diagnosis not present

## 2023-08-01 DIAGNOSIS — M546 Pain in thoracic spine: Secondary | ICD-10-CM | POA: Diagnosis not present

## 2023-08-01 DIAGNOSIS — D6852 Prothrombin gene mutation: Secondary | ICD-10-CM | POA: Diagnosis not present

## 2023-08-01 DIAGNOSIS — R29818 Other symptoms and signs involving the nervous system: Secondary | ICD-10-CM | POA: Diagnosis not present

## 2023-08-01 DIAGNOSIS — I63432 Cerebral infarction due to embolism of left posterior cerebral artery: Secondary | ICD-10-CM | POA: Diagnosis not present

## 2023-08-01 DIAGNOSIS — R1310 Dysphagia, unspecified: Secondary | ICD-10-CM | POA: Diagnosis not present

## 2023-08-01 DIAGNOSIS — R188 Other ascites: Secondary | ICD-10-CM | POA: Diagnosis not present

## 2023-08-01 DIAGNOSIS — G936 Cerebral edema: Secondary | ICD-10-CM | POA: Diagnosis not present

## 2023-08-01 DIAGNOSIS — R569 Unspecified convulsions: Secondary | ICD-10-CM | POA: Diagnosis not present

## 2023-08-01 DIAGNOSIS — R29702 NIHSS score 2: Secondary | ICD-10-CM | POA: Diagnosis not present

## 2023-08-01 DIAGNOSIS — E43 Unspecified severe protein-calorie malnutrition: Secondary | ICD-10-CM | POA: Diagnosis present

## 2023-08-01 DIAGNOSIS — Z794 Long term (current) use of insulin: Secondary | ICD-10-CM | POA: Diagnosis not present

## 2023-08-01 DIAGNOSIS — E8729 Other acidosis: Secondary | ICD-10-CM | POA: Diagnosis not present

## 2023-08-01 DIAGNOSIS — I700 Atherosclerosis of aorta: Secondary | ICD-10-CM | POA: Diagnosis not present

## 2023-08-01 DIAGNOSIS — K31A0 Gastric intestinal metaplasia, unspecified: Secondary | ICD-10-CM | POA: Diagnosis not present

## 2023-08-01 DIAGNOSIS — Z4682 Encounter for fitting and adjustment of non-vascular catheter: Secondary | ICD-10-CM | POA: Diagnosis not present

## 2023-08-01 DIAGNOSIS — R419 Unspecified symptoms and signs involving cognitive functions and awareness: Secondary | ICD-10-CM | POA: Diagnosis not present

## 2023-08-01 DIAGNOSIS — I63443 Cerebral infarction due to embolism of bilateral cerebellar arteries: Secondary | ICD-10-CM | POA: Diagnosis not present

## 2023-08-01 DIAGNOSIS — R4189 Other symptoms and signs involving cognitive functions and awareness: Secondary | ICD-10-CM | POA: Diagnosis present

## 2023-08-01 DIAGNOSIS — D65 Disseminated intravascular coagulation [defibrination syndrome]: Secondary | ICD-10-CM | POA: Diagnosis not present

## 2023-08-01 DIAGNOSIS — K2980 Duodenitis without bleeding: Secondary | ICD-10-CM | POA: Diagnosis present

## 2023-08-01 DIAGNOSIS — D689 Coagulation defect, unspecified: Secondary | ICD-10-CM | POA: Diagnosis not present

## 2023-08-01 DIAGNOSIS — I484 Atypical atrial flutter: Secondary | ICD-10-CM | POA: Diagnosis not present

## 2023-08-01 DIAGNOSIS — I272 Pulmonary hypertension, unspecified: Secondary | ICD-10-CM | POA: Diagnosis not present

## 2023-08-01 DIAGNOSIS — R531 Weakness: Secondary | ICD-10-CM | POA: Diagnosis not present

## 2023-08-01 DIAGNOSIS — J949 Pleural condition, unspecified: Secondary | ICD-10-CM | POA: Diagnosis not present

## 2023-08-01 DIAGNOSIS — R413 Other amnesia: Secondary | ICD-10-CM | POA: Diagnosis not present

## 2023-08-01 DIAGNOSIS — R0902 Hypoxemia: Secondary | ICD-10-CM | POA: Diagnosis not present

## 2023-08-01 DIAGNOSIS — G319 Degenerative disease of nervous system, unspecified: Secondary | ICD-10-CM | POA: Diagnosis not present

## 2023-08-01 DIAGNOSIS — E119 Type 2 diabetes mellitus without complications: Secondary | ICD-10-CM | POA: Diagnosis not present

## 2023-08-01 DIAGNOSIS — L89316 Pressure-induced deep tissue damage of right buttock: Secondary | ICD-10-CM | POA: Diagnosis not present

## 2023-08-01 DIAGNOSIS — E440 Moderate protein-calorie malnutrition: Secondary | ICD-10-CM | POA: Diagnosis not present

## 2023-08-01 DIAGNOSIS — Z452 Encounter for adjustment and management of vascular access device: Secondary | ICD-10-CM | POA: Diagnosis not present

## 2023-08-01 DIAGNOSIS — K449 Diaphragmatic hernia without obstruction or gangrene: Secondary | ICD-10-CM | POA: Diagnosis not present

## 2023-08-01 DIAGNOSIS — J90 Pleural effusion, not elsewhere classified: Secondary | ICD-10-CM | POA: Diagnosis not present

## 2023-08-01 DIAGNOSIS — F05 Delirium due to known physiological condition: Secondary | ICD-10-CM | POA: Diagnosis not present

## 2023-08-01 DIAGNOSIS — G4733 Obstructive sleep apnea (adult) (pediatric): Secondary | ICD-10-CM | POA: Diagnosis present

## 2023-08-01 DIAGNOSIS — C61 Malignant neoplasm of prostate: Secondary | ICD-10-CM | POA: Diagnosis not present

## 2023-08-01 DIAGNOSIS — Z515 Encounter for palliative care: Secondary | ICD-10-CM

## 2023-08-01 DIAGNOSIS — A419 Sepsis, unspecified organism: Secondary | ICD-10-CM | POA: Diagnosis not present

## 2023-08-01 DIAGNOSIS — R161 Splenomegaly, not elsewhere classified: Secondary | ICD-10-CM | POA: Diagnosis not present

## 2023-08-01 DIAGNOSIS — J984 Other disorders of lung: Secondary | ICD-10-CM | POA: Diagnosis not present

## 2023-08-01 DIAGNOSIS — E1151 Type 2 diabetes mellitus with diabetic peripheral angiopathy without gangrene: Secondary | ICD-10-CM | POA: Diagnosis not present

## 2023-08-01 DIAGNOSIS — D630 Anemia in neoplastic disease: Secondary | ICD-10-CM | POA: Diagnosis not present

## 2023-08-01 DIAGNOSIS — K8020 Calculus of gallbladder without cholecystitis without obstruction: Secondary | ICD-10-CM | POA: Diagnosis not present

## 2023-08-01 DIAGNOSIS — J9690 Respiratory failure, unspecified, unspecified whether with hypoxia or hypercapnia: Secondary | ICD-10-CM | POA: Diagnosis not present

## 2023-08-01 DIAGNOSIS — F419 Anxiety disorder, unspecified: Secondary | ICD-10-CM | POA: Diagnosis present

## 2023-08-01 DIAGNOSIS — S36119A Unspecified injury of liver, initial encounter: Secondary | ICD-10-CM | POA: Diagnosis not present

## 2023-08-01 DIAGNOSIS — L89322 Pressure ulcer of left buttock, stage 2: Secondary | ICD-10-CM | POA: Diagnosis not present

## 2023-08-01 DIAGNOSIS — R918 Other nonspecific abnormal finding of lung field: Secondary | ICD-10-CM | POA: Diagnosis not present

## 2023-08-01 DIAGNOSIS — I6389 Other cerebral infarction: Secondary | ICD-10-CM | POA: Diagnosis not present

## 2023-08-01 DIAGNOSIS — J690 Pneumonitis due to inhalation of food and vomit: Secondary | ICD-10-CM | POA: Diagnosis not present

## 2023-08-01 DIAGNOSIS — D696 Thrombocytopenia, unspecified: Secondary | ICD-10-CM

## 2023-08-01 DIAGNOSIS — R7401 Elevation of levels of liver transaminase levels: Secondary | ICD-10-CM | POA: Diagnosis not present

## 2023-08-01 DIAGNOSIS — K8000 Calculus of gallbladder with acute cholecystitis without obstruction: Secondary | ICD-10-CM | POA: Diagnosis not present

## 2023-08-01 DIAGNOSIS — Z833 Family history of diabetes mellitus: Secondary | ICD-10-CM

## 2023-08-01 DIAGNOSIS — E538 Deficiency of other specified B group vitamins: Secondary | ICD-10-CM | POA: Diagnosis present

## 2023-08-01 DIAGNOSIS — Z87891 Personal history of nicotine dependence: Secondary | ICD-10-CM

## 2023-08-01 DIAGNOSIS — D62 Acute posthemorrhagic anemia: Secondary | ICD-10-CM | POA: Diagnosis not present

## 2023-08-01 DIAGNOSIS — E669 Obesity, unspecified: Secondary | ICD-10-CM | POA: Diagnosis present

## 2023-08-01 DIAGNOSIS — E872 Acidosis, unspecified: Secondary | ICD-10-CM | POA: Diagnosis not present

## 2023-08-01 HISTORY — DX: Other primary thrombophilia: D68.59

## 2023-08-01 LAB — TROPONIN I (HIGH SENSITIVITY)
Troponin I (High Sensitivity): 5 ng/L (ref <18)
Troponin I (High Sensitivity): 4 ng/L (ref <18)

## 2023-08-01 LAB — URINALYSIS, ROUTINE W REFLEX MICROSCOPIC
Bilirubin Urine: NEGATIVE
Glucose, UA: NEGATIVE mg/dL
Hgb urine dipstick: NEGATIVE
Ketones, ur: NEGATIVE mg/dL
Leukocytes,Ua: NEGATIVE
Nitrite: NEGATIVE
Protein, ur: NEGATIVE mg/dL
Specific Gravity, Urine: 1.025 (ref 1.005–1.030)
pH: 5.5 (ref 5.0–8.0)

## 2023-08-01 LAB — CBC
HCT: 24.9 % — ABNORMAL LOW (ref 39.0–52.0)
Hemoglobin: 8.7 g/dL — ABNORMAL LOW (ref 13.0–17.0)
MCH: 30.4 pg (ref 26.0–34.0)
MCHC: 34.9 g/dL (ref 30.0–36.0)
MCV: 87.1 fL (ref 80.0–100.0)
Platelets: 99 10*3/uL — ABNORMAL LOW (ref 150–400)
RBC: 2.86 MIL/uL — ABNORMAL LOW (ref 4.22–5.81)
RDW: 14.6 % (ref 11.5–15.5)
WBC: 7.9 10*3/uL (ref 4.0–10.5)
nRBC: 0 % (ref 0.0–0.2)

## 2023-08-01 LAB — COMPREHENSIVE METABOLIC PANEL
ALT: 15 U/L (ref 0–44)
AST: 26 U/L (ref 15–41)
Albumin: 2.6 g/dL — ABNORMAL LOW (ref 3.5–5.0)
Alkaline Phosphatase: 43 U/L (ref 38–126)
Anion gap: 10 (ref 5–15)
BUN: 17 mg/dL (ref 8–23)
CO2: 21 mmol/L — ABNORMAL LOW (ref 22–32)
Calcium: 8.1 mg/dL — ABNORMAL LOW (ref 8.9–10.3)
Chloride: 101 mmol/L (ref 98–111)
Creatinine, Ser: 0.87 mg/dL (ref 0.61–1.24)
GFR, Estimated: >60 mL/min (ref >60)
Glucose, Bld: 168 mg/dL — ABNORMAL HIGH (ref 70–99)
Potassium: 4.1 mmol/L (ref 3.5–5.1)
Sodium: 132 mmol/L — ABNORMAL LOW (ref 135–145)
Total Bilirubin: 1.9 mg/dL — ABNORMAL HIGH (ref 0.0–1.2)
Total Protein: 4.7 g/dL — ABNORMAL LOW (ref 6.5–8.1)

## 2023-08-01 LAB — CBC WITH DIFFERENTIAL/PLATELET
Abs Immature Granulocytes: 0.05 10*3/uL (ref 0.00–0.07)
Basophils Absolute: 0 10*3/uL (ref 0.0–0.1)
Basophils Relative: 1 %
Eosinophils Absolute: 0.1 10*3/uL (ref 0.0–0.5)
Eosinophils Relative: 1 %
HCT: 26.8 % — ABNORMAL LOW (ref 39.0–52.0)
Hemoglobin: 9 g/dL — ABNORMAL LOW (ref 13.0–17.0)
Immature Granulocytes: 1 %
Lymphocytes Relative: 13 %
Lymphs Abs: 1 10*3/uL (ref 0.7–4.0)
MCH: 30.2 pg (ref 26.0–34.0)
MCHC: 33.6 g/dL (ref 30.0–36.0)
MCV: 89.9 fL (ref 80.0–100.0)
Monocytes Absolute: 1.9 10*3/uL — ABNORMAL HIGH (ref 0.1–1.0)
Monocytes Relative: 24 %
Neutro Abs: 4.8 10*3/uL (ref 1.7–7.7)
Neutrophils Relative %: 60 %
Platelets: 87 10*3/uL — ABNORMAL LOW (ref 150–400)
RBC: 2.98 MIL/uL — ABNORMAL LOW (ref 4.22–5.81)
RDW: 14.6 % (ref 11.5–15.5)
WBC: 7.9 10*3/uL (ref 4.0–10.5)
nRBC: 0 % (ref 0.0–0.2)

## 2023-08-01 LAB — RESP PANEL BY RT-PCR (RSV, FLU A&B, COVID)  RVPGX2
Influenza A by PCR: NEGATIVE
Influenza B by PCR: NEGATIVE
Resp Syncytial Virus by PCR: NEGATIVE
SARS Coronavirus 2 by RT PCR: NEGATIVE

## 2023-08-01 LAB — VITAMIN B12: Vitamin B-12: 142 pg/mL — ABNORMAL LOW (ref 180–914)

## 2023-08-01 LAB — TYPE AND SCREEN
ABO/RH(D): AB POS
Antibody Screen: NEGATIVE

## 2023-08-01 LAB — IRON AND TIBC
Iron: 36 ug/dL — ABNORMAL LOW (ref 45–182)
Saturation Ratios: 17 % — ABNORMAL LOW (ref 17.9–39.5)
TIBC: 217 ug/dL — ABNORMAL LOW (ref 250–450)
UIBC: 181 ug/dL

## 2023-08-01 LAB — RETICULOCYTES
Immature Retic Fract: 10.9 % (ref 2.3–15.9)
RBC.: 3.11 MIL/uL — ABNORMAL LOW (ref 4.22–5.81)
Retic Count, Absolute: 94.2 10*3/uL (ref 19.0–186.0)
Retic Ct Pct: 3 % (ref 0.4–3.1)

## 2023-08-01 LAB — MAGNESIUM: Magnesium: 1.4 mg/dL — ABNORMAL LOW (ref 1.7–2.4)

## 2023-08-01 LAB — GLUCOSE, CAPILLARY: Glucose-Capillary: 115 mg/dL — ABNORMAL HIGH (ref 70–99)

## 2023-08-01 LAB — FOLATE: Folate: 14.9 ng/mL (ref >5.9)

## 2023-08-01 LAB — POC OCCULT BLOOD, ED: Fecal Occult Bld: NEGATIVE

## 2023-08-01 LAB — LIPASE, BLOOD: Lipase: 27 U/L (ref 11–51)

## 2023-08-01 LAB — HEMOGLOBIN A1C
Hgb A1c MFr Bld: 5.3 % (ref 4.8–5.6)
Mean Plasma Glucose: 105.41 mg/dL

## 2023-08-01 LAB — FERRITIN: Ferritin: 577 ng/mL — ABNORMAL HIGH (ref 24–336)

## 2023-08-01 LAB — BRAIN NATRIURETIC PEPTIDE: B Natriuretic Peptide: 33 pg/mL (ref 0.0–100.0)

## 2023-08-01 LAB — T4, FREE: Free T4: 1.06 ng/dL (ref 0.61–1.12)

## 2023-08-01 LAB — TSH: TSH: 1.499 u[IU]/mL (ref 0.350–4.500)

## 2023-08-01 MED ORDER — MAGNESIUM SULFATE 2 GM/50ML IV SOLN
2.0000 g | Freq: Once | INTRAVENOUS | Status: AC
  Administered 2023-08-01: 2 g via INTRAVENOUS
  Filled 2023-08-01: qty 50

## 2023-08-01 MED ORDER — ACETAMINOPHEN 325 MG PO TABS
650.0000 mg | ORAL_TABLET | Freq: Four times a day (QID) | ORAL | Status: DC | PRN
  Administered 2023-08-01 – 2023-08-10 (×9): 650 mg via ORAL
  Filled 2023-08-01 (×9): qty 2

## 2023-08-01 MED ORDER — LACTATED RINGERS IV SOLN: INTRAVENOUS | Status: AC

## 2023-08-01 MED ORDER — ONDANSETRON HCL 4 MG PO TABS: 4.0000 mg | ORAL_TABLET | Freq: Four times a day (QID) | ORAL | Status: DC | PRN

## 2023-08-01 MED ORDER — ALBUTEROL SULFATE (2.5 MG/3ML) 0.083% IN NEBU
2.5000 mg | INHALATION_SOLUTION | Freq: Four times a day (QID) | RESPIRATORY_TRACT | Status: DC
  Administered 2023-08-01 – 2023-08-02 (×4): 2.5 mg via RESPIRATORY_TRACT
  Filled 2023-08-01 (×4): qty 3

## 2023-08-01 MED ORDER — ATORVASTATIN CALCIUM 40 MG PO TABS
40.0000 mg | ORAL_TABLET | Freq: Every day | ORAL | Status: DC
  Administered 2023-08-01 – 2023-08-06 (×6): 40 mg via ORAL
  Filled 2023-08-01 (×6): qty 1

## 2023-08-01 MED ORDER — TICAGRELOR 90 MG PO TABS: 90.0000 mg | ORAL_TABLET | Freq: Two times a day (BID) | ORAL | Status: DC

## 2023-08-01 MED ORDER — INSULIN ASPART 100 UNIT/ML IJ SOLN
0.0000 [IU] | Freq: Every day | INTRAMUSCULAR | Status: DC
  Administered 2023-08-06: 2 [IU] via SUBCUTANEOUS

## 2023-08-01 MED ORDER — TAMSULOSIN HCL 0.4 MG PO CAPS
0.4000 mg | ORAL_CAPSULE | Freq: Every evening | ORAL | Status: DC
  Administered 2023-08-01 – 2023-08-06 (×5): 0.4 mg via ORAL
  Filled 2023-08-01 (×6): qty 1

## 2023-08-01 MED ORDER — FLUOXETINE HCL 10 MG PO CAPS: 10.0000 mg | ORAL_CAPSULE | Freq: Every day | ORAL | Status: DC

## 2023-08-01 MED ORDER — ASPIRIN 81 MG PO CHEW
81.0000 mg | CHEWABLE_TABLET | Freq: Every day | ORAL | Status: DC
  Administered 2023-08-01 – 2023-08-06 (×6): 81 mg via ORAL
  Filled 2023-08-01 (×6): qty 1

## 2023-08-01 MED ORDER — INSULIN GLARGINE 100 UNIT/ML ~~LOC~~ SOLN
30.0000 [IU] | Freq: Every morning | SUBCUTANEOUS | Status: DC
  Filled 2023-08-01: qty 0.3

## 2023-08-01 MED ORDER — ONDANSETRON HCL 4 MG/2ML IJ SOLN: 4.0000 mg | Freq: Four times a day (QID) | INTRAMUSCULAR | Status: DC | PRN

## 2023-08-01 MED ORDER — ASPIRIN 81 MG PO CHEW: 81.0000 mg | CHEWABLE_TABLET | Freq: Every day | ORAL | Status: DC

## 2023-08-01 MED ORDER — PANTOPRAZOLE SODIUM 40 MG PO TBEC
40.0000 mg | DELAYED_RELEASE_TABLET | Freq: Two times a day (BID) | ORAL | Status: DC
  Administered 2023-08-02 – 2023-08-06 (×8): 40 mg via ORAL
  Filled 2023-08-01 (×11): qty 1

## 2023-08-01 MED ORDER — LACTATED RINGERS IV BOLUS
1000.0000 mL | Freq: Once | INTRAVENOUS | Status: AC
  Administered 2023-08-01: 1000 mL via INTRAVENOUS

## 2023-08-01 MED ORDER — INSULIN ASPART 100 UNIT/ML IJ SOLN
0.0000 [IU] | Freq: Three times a day (TID) | INTRAMUSCULAR | Status: DC
Start: 2023-08-01 — End: 2023-08-08
  Administered 2023-08-02: 3 [IU] via SUBCUTANEOUS
  Administered 2023-08-02: 2 [IU] via SUBCUTANEOUS
  Administered 2023-08-03: 3 [IU] via SUBCUTANEOUS
  Administered 2023-08-03 (×2): 2 [IU] via SUBCUTANEOUS
  Administered 2023-08-04 – 2023-08-05 (×4): 3 [IU] via SUBCUTANEOUS
  Administered 2023-08-05: 2 [IU] via SUBCUTANEOUS
  Administered 2023-08-05: 5 [IU] via SUBCUTANEOUS
  Administered 2023-08-06: 3 [IU] via SUBCUTANEOUS
  Administered 2023-08-06 – 2023-08-08 (×2): 2 [IU] via SUBCUTANEOUS

## 2023-08-01 MED ORDER — ESCITALOPRAM OXALATE 10 MG PO TABS: 20.0000 mg | ORAL_TABLET | Freq: Every day | ORAL | Status: DC

## 2023-08-01 MED ORDER — ACETAMINOPHEN 650 MG RE SUPP
650.0000 mg | Freq: Four times a day (QID) | RECTAL | Status: DC | PRN
  Administered 2023-08-07: 650 mg via RECTAL
  Filled 2023-08-01: qty 1

## 2023-08-01 MED ORDER — TICAGRELOR 90 MG PO TABS
90.0000 mg | ORAL_TABLET | Freq: Two times a day (BID) | ORAL | Status: DC
  Administered 2023-08-01 – 2023-08-06 (×10): 90 mg via ORAL
  Filled 2023-08-01 (×10): qty 1

## 2023-08-01 MED ORDER — EZETIMIBE 10 MG PO TABS
10.0000 mg | ORAL_TABLET | Freq: Every day | ORAL | Status: DC
  Administered 2023-08-02 – 2023-08-06 (×5): 10 mg via ORAL
  Filled 2023-08-01 (×5): qty 1

## 2023-08-01 MED ORDER — TAMSULOSIN HCL 0.4 MG PO CAPS: 0.4000 mg | ORAL_CAPSULE | Freq: Every day | ORAL | Status: DC

## 2023-08-01 MED ORDER — ESCITALOPRAM OXALATE 20 MG PO TABS
20.0000 mg | ORAL_TABLET | Freq: Every day | ORAL | Status: DC
  Administered 2023-08-01 – 2023-08-04 (×4): 20 mg via ORAL
  Filled 2023-08-01: qty 1
  Filled 2023-08-01: qty 2
  Filled 2023-08-01 (×2): qty 1

## 2023-08-01 MED ORDER — BISACODYL 5 MG PO TBEC
5.0000 mg | DELAYED_RELEASE_TABLET | Freq: Every day | ORAL | Status: DC | PRN
  Filled 2023-08-01: qty 1

## 2023-08-01 NOTE — H&P (Signed)
 [facility] H&P   Patient Demographics:    Kenneth [name], is a 73 y.o. male  MRN: [id number]   DOB - [birth year]  Admit Date - 08/01/2023  Outpatient Primary MD for the patient is [name], MD  Patient coming from: Home  Chief Complaint  Patient presents with   Weakness      HPI:    Kenneth [name]  is a 73 y.o. male, with history of stroke, currently on DAPT, dyslipidemia, BPH, prostate cancer, insulin-dependent DM type II, hypertension, cervical stenosis, history of iron deficiency anemia, history of OSA does not wear CPAP, recent history of right ankle strain 2 weeks ago, presents to the hospital for 2-3 history of gradually progressive weakness, was also not eating or drinking well for a few days.  For the last couple of days he has been having problems getting out of the bed and ambulating as he is extremely weak, in the ER he was found to be hypotensive, had hypomagnesemia, was also found to have new onset anemia and I was called to admit the patient.  He currently denies any headache, no fever or chills, no new problems with vision or hearing, he has longstanding bilateral hearing loss and wears hearing aids, no chest pain or palpitations, no abdominal pain, no blood in stool or urine, no dysuria, no melena or dark stools, he has right ankle sprain which is gradually getting better, no new focal deficits he had a stroke few months ago from which she has fully recovered.  He does have issues with BPH and 2 days ago started Flomax which she had stopped for several months as he was getting dizzy on it, besides generalized weakness which is gradually progressive and diffuse he has no other subjective complaints.    Review  of systems:    A full 10 point Review of Systems was done, except as stated above, all other Review of Systems were negative.   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   History of colon polyps - adenomas 04/03/2010  Hyperlipidemia    Hypertension    Iron deficiency anemia, unspecified    OSA (obstructive sleep apnea)    refusing CPAP   Pneumonia 03/2022   w/effusion   Prostate cancer [facility])    Stroke [facility])       Past Surgical History:  Procedure Laterality Date   ANTERIOR CERVICAL DECOMP/DISCECTOMY FUSION N/A 10/01/2012   Procedure: ANTERIOR CERVICAL DECOMPRESSION/DISCECTOMY FUSION 2 LEVELS;  Surgeon: Tia Alert, MD;  Location: [facility];  Service: Neurosurgery;  Laterality: N/A;  Cervical five-six,Cervical six-seven   COLONOSCOPY  09/30/2010, 03/01/2014   diverticulosis, small internal hemorrhoids   COLONOSCOPY W/ POLYPECTOMY  04/03/2010   4 polyps 8-50mm, worst TV adenoma with high-grade dysplasia   LOOP RECORDER INSERTION N/A 10/10/2022   Procedure: LOOP RECORDER INSERTION;  Surgeon: Marinus Maw, MD;  Location: [facility];  Service: Cardiovascular;  Laterality: N/A;   NO PAST SURGERIES     POLYPECTOMY     POSTERIOR CERVICAL FUSION/FORAMINOTOMY Left 10/20/2012   Procedure: C/5-6,C/6-7 Lami/Multi level,POSTERIOR CERVICAL FUSION/FORAMINOTOMY C/4-7;  Surgeon: Tia Alert, MD;  Location: [facility];  Service: Neurosurgery;  Laterality: Left;  Cervical Five-Six, Cervical Six-Seven Laminectomies, Posterior Cervical Fusion/Foraminotomies Cervical Four through Seven.    TONSILLECTOMY        Social History:     Social History   Tobacco Use   Smoking status: Former    Current packs/day: 0.00    Average packs/day: 0.3 packs/day for  2.0 years (0.5 ttl pk-yrs)    Types: Cigarettes    Start date: 09/23/1967    Quit date: 09/22/1969    Years since quitting: 53.8   Smokeless tobacco: Never  Substance Use Topics   Alcohol use: No         Family History :     Family History  Problem Relation Age of Onset   Diabetes Mother    Stomach cancer Neg Hx    Rectal cancer Neg Hx    Pancreatic cancer Neg Hx    Colon cancer Neg Hx    Colon polyps Neg Hx    Esophageal cancer Neg Hx    Prostate cancer Neg Hx    Breast cancer Neg Hx        Home Medications:   Prior to Admission medications   Medication Sig Start Date End Date Taking? Authorizing Provider  aspirin 81 MG chewable tablet Chew 1 tablet (81 mg total) by mouth daily. 04/08/23   Marinda Elk, MD  atorvastatin (LIPITOR) 40 MG tablet Take 1 tablet (40 mg total) by mouth daily. Patient taking differently: Take 40 mg by mouth at bedtime. 10/17/22   Love, Evlyn Kanner, PA-C  azithromycin (ZITHROMAX) 250 MG tablet Take 1 tablet (250 mg total) by mouth daily. Take first 2 tablets together, then 1 every day until finished. 04/11/23   Netta Corrigan, PA-C  benazepril (LOTENSIN) 40 MG tablet Take 20 mg by mouth in the morning and at bedtime.    [provider]  BRILINTA 90 MG TABS tablet Take 90 mg by mouth 2 (two) times daily. 04/24/23   [provider]  chlorthalidone (HYGROTON) 25 MG tablet Take 25 mg by mouth daily.    [provider]  Cholecalciferol (VITAMIN D3) 1000 units CAPS Take 1,000 Units by mouth in the morning and at bedtime.    [provider]  Continuous Glucose Sensor (FREESTYLE LIBRE 14 DAY SENSOR) MISC Inject 1 Device into the skin every 14 (fourteen) days.  [provider]  escitalopram (LEXAPRO) 20 MG tablet Take 20 mg by mouth daily.    [provider]  ezetimibe (ZETIA) 10 MG tablet Take 10 mg by mouth daily. 01/19/20   [provider]  FLUoxetine (PROZAC) 10 MG capsule Take 10 mg by  mouth daily.    [provider]  metFORMIN (GLUCOPHAGE-XR) 500 MG 24 hr tablet Take 500 mg by mouth in the morning and at bedtime.    [provider]  MOUNJARO 5 MG/0.5ML Pen Inject 5 mg into the skin every Monday.    [provider]  tamsulosin (FLOMAX) 0.4 MG CAPS capsule Take 0.4 mg by mouth daily. 07/31/23   [provider]  TOUJEO SOLOSTAR 300 UNIT/ML Solostar Pen Inject 40 Units into the skin in the morning. Patient taking differently: Inject 30 Units into the skin in the morning. 04/08/23   Marinda Elk, MD     Allergies:     Allergies  Allergen Reactions   Jardiance [Empagliflozin] Other (See Comments)    To the hospital- due to complications   Fluoxetine Other (See Comments)    Prefers to NOT take this   Penicillins Other (See Comments)    CHILDHOOD ALLERGY- exact reaction? Has patient had a PCN reaction causing immediate rash, facial/tongue/throat swelling, SOB or lightheadedness with hypotension: Unknown Has patient had a PCN reaction causing severe rash involving mucus membranes or skin necrosis: Unknown Has patient had a PCN reaction that required hospitalization: Unknown Has patient had a PCN reaction occurring within the last 10 years: Unknown If all of the above answers are "NO", then may proceed with Cephalosporin use.     Silodosin Other (See Comments)    Vertigo      Physical Exam:   Vitals  Blood pressure (!) 108/46, pulse 72, temperature 98.3 F (36.8 C), temperature source Oral, resp. rate (!) 22, height 5\' 11"  (1.803 m), weight 86.6 kg, SpO2 100%.   1. General -early white Caucasian male lying in hospital bed in no apparent discomfort,  2. Normal affect and insight, Not Suicidal or Homicidal, Awake Alert,   3. No F.N deficits, ALL C.Nerves Intact, Strength 5/5 all 4 extremities, Sensation intact all 4 extremities, Plantars down going.  4. Ears and Eyes appear Normal, Conjunctivae clear, PERRLA. Moist Oral  Mucosa.  5. Supple Neck, No JVD, No cervical lymphadenopathy appriciated, No Carotid Bruits.  6. Symmetrical Chest wall movement, Good air movement bilaterally, CTAB.  7. RRR, No Gallops, Rubs or Murmurs, No Parasternal Heave.  8. Positive Bowel Sounds, Abdomen Soft, No tenderness, No organomegaly appriciated,No rebound -guarding or rigidity.  9.  No Cyanosis, Normal Skin Turgor, No Skin Rash or Bruise.  10. Good muscle tone,  joints appear normal , no effusions, Normal ROM.  Wearing right ankle compressive bandage  11. No Palpable Lymph Nodes in Neck or Axillae      Data Review:   Recent Labs  Lab 08/01/23 0630  WBC 7.9  HGB 9.0*  HCT 26.8*  PLT 87*  MCV 89.9  MCH 30.2  MCHC 33.6  RDW 14.6  LYMPHSABS 1.0  MONOABS 1.9*  EOSABS 0.1  BASOSABS 0.0    Recent Labs  Lab 08/01/23 0630 08/01/23 0913  NA 132*  --   K 4.1  --   CL 101  --   CO2 21*  --   ANIONGAP 10  --   GLUCOSE 168*  --   BUN 17  --   CREATININE 0.87  --  AST 26  --   ALT 15  --   ALKPHOS 43  --   BILITOT 1.9*  --   ALBUMIN 2.6*  --   BNP  --  33.0  MG 1.4*  --   CALCIUM 8.1*  --     Lab Results  Component Value Date   CHOL 79 04/07/2023   HDL 17 (L) 04/07/2023   LDLCALC 47 04/07/2023   TRIG 75 04/07/2023   CHOLHDL 4.6 04/07/2023    Recent Labs  Lab 08/01/23 0630 08/01/23 0913  BNP  --  33.0  MG 1.4*  --   CALCIUM 8.1*  --     Recent Labs  Lab 08/01/23 0630  WBC 7.9  PLT 87*  CREATININE 0.87    Urinalysis    Component Value Date/Time   COLORURINE YELLOW 04/11/2023 1231   APPEARANCEUR CLEAR 04/11/2023 1231   LABSPEC 1.008 04/11/2023 1231   PHURINE 5.0 04/11/2023 1231   GLUCOSEU NEGATIVE 04/11/2023 1231   HGBUR NEGATIVE 04/11/2023 1231   BILIRUBINUR NEGATIVE 04/11/2023 1231   KETONESUR NEGATIVE 04/11/2023 1231   PROTEINUR NEGATIVE 04/11/2023 1231   NITRITE NEGATIVE 04/11/2023 1231   LEUKOCYTESUR NEGATIVE 04/11/2023 1231      Imaging Results:    DG Chest  Port 1 View Result Date: 08/01/2023 CLINICAL DATA:  Generalized weakness, onset 8 p.m. last night. EXAM: PORTABLE CHEST 1 VIEW COMPARISON:  AP Lat chest 04/11/2023 FINDINGS: There is chronic thickening of lower lateral right pleura, associated chronic pleuroparenchymal disease in the lateral right base. The lungs are otherwise clear. A loop recorder device is again noted on the left. The cardiac size is normal. There is mild aortic tortuosity and atherosclerosis with a stable mediastinum and no vascular congestion. No new osseous findings. Dorsal and ventral cervical fusion hardware is partially visible. Multilevel thoracic spine bridging enthesopathy. IMPRESSION: 1. No evidence of acute chest disease. 2. 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 3. Aortic atherosclerosis. Electronically Signed   By: Almira Bar M.D.   On: 08/01/2023 07:44    My personal review of EKG: Rhythm NSR, no acute ST changes.   Assessment & Plan:    1.  Generalized weakness in a patient with gradually progressive anemia of chronic disease, dehydration, hypomagnesemia.  Patient has had chronic anemia for several years however there is at least a 3 g drop in his hemoglobin in the last few months, of note he has been started on DAPT for his recent stroke around that timeframe, he denies any hematemesis, no dark stools or blood in stool.  He likely has intermittent chronic GI blood loss, Hemoccult negative in the ER, place him on PPI, for now continue DAPT, GI has been consulted.  He is agreeable for transfusions if needed in the future, will get IV fluids for dehydration along with PT OT.  Will check UA, B12, TSH along with anemia panel as well  2.  Hypomagnesemia and dehydration.  Replace magnesium, IV fluids and monitor  3.  Recent stroke few months ago.  Continue DAPT for now, continue statin and Zetia.  No focal deficits but does have significant generalized weakness, PT OT may require placement.  4.   Dyslipidemia.  Continue statin and Zetia.  5.  BPH.  Continue Flomax.  UA to rule out underlying UTI.  6.  Depression.  On Prozac and Lexapro combination continue, no acute issues.  7.  OSA.  Does not use CPAP.    8.  Essential hypertension.  Currently blood pressure low, dehydrated, hold blood pressure medications hydrate and monitor.    9.  History of cervical stenosis, recent right ankle strain.  Supportive care.    10. DM type II.  On high doses of insulin at home, continue with q. [facility] S sliding scale.     DVT Prophylaxis SCDs    AM Labs Ordered, also please review Full Orders  Family Communication: Admission, patients condition and plan of care including tests being ordered have been discussed with the patient and wife who indicate understanding and agree with the plan and Code Status.  Code Status Full  Likely DC to  TBD  Condition GUARDED    Consults called: GI by EDP    Admission status: Inpt    Time spent in minutes : 35  Signature  -    Susa Raring M.D on 08/01/2023 at 12:39 PM   -  To page go to [email or web address]

## 2023-08-01 NOTE — Consult Note (Signed)
 Consultation Note   Referring Provider:  Emergency Services PCP: [name], MD Primary Gastroenterologist::  Doristine Locks, MD        Reason for Consultation:  Anemia DOA: 08/01/2023         Hospital Day: 1   ASSESSMENT    73 y.o. year old male with a history of CVA on DAPT (Brillinta + baby asa) admitted with weakness, hypotension and worsening of chronic anemia. No overt GI bleeding /  FOBT negative. No GI symptoms other than new loud belching. Cause of worsening anemia unclear.   History of colon polyps.  Multiple small TAs at time of surveillance colonoscopy in Oct 2021. Five year recall recommended  Low magnesium level  Hx of CVA May 2024 OSA DM2 Hypertension Prostate cancer See PMH for additional history     PLAN:   --IV fluids, Mg+ repletion in progress --Empiric Pantoprazole BID ( already ordered) --Monitor H/H, transfuse if needed.  --Will consider doing an EGD given the need for DAPT. I don't know that he will need colonoscopy this admission   HPI   Patient brought to ED today by EMS for weakness.  Found to be hypotensive and given 500 mL of normal saline prior to hospital arrival  Patient has chronic mild normocytic anemia.  Baseline hemoglobin in the low 12 range.  In ED hemoglobin 9, platelet 87.  Ferritin 577, TIBC low along with a low iron saturation.  FOBT negative. Total bilirubin 1.9, Alk Phos and transaminases normal.  Thyroid studies normal.  No acute findings on chest x-ray  Patient hasn't had seen any blood in his stools or has black stools.  No abdominal pain.  No nausea or vomiting.  Wife does mention that he has recently started belching loudly.  Patient takes a daily baby aspirin, no other NSAIDs.  He is not on an acid blocker at home. Has Mounjaro on home med list but reportedly not taking it.   Labs and Imaging:  Recent Labs    08/01/23 0630  WBC 7.9  HGB 9.0*  HCT 26.8*  MCV 89.9   PLT 87*   Recent Labs    08/01/23 1310  FERRITIN 577*  TIBC 217*  IRONPCTSAT 17*   Recent Labs    08/01/23 0630  NA 132*  K 4.1  CL 101  CO2 21*  GLUCOSE 168*  BUN 17  CREATININE 0.87  CALCIUM 8.1*   Recent Labs    08/01/23 0630  PROT 4.7*  ALBUMIN 2.6*  AST 26  ALT 15  ALKPHOS 43  BILITOT 1.9*   No results for input(s): "INR" in the last 72 hours. No results for input(s): "AFPTUMOR" in the last 72 hours.  DG Chest Port 1 View CLINICAL DATA:  Generalized weakness, onset 8 p.m. last night.  EXAM: PORTABLE CHEST 1 VIEW  COMPARISON:  AP Lat chest 04/11/2023  FINDINGS: There is chronic thickening of lower lateral right pleura, associated chronic pleuroparenchymal disease in the lateral right base.  The lungs are otherwise clear. A loop recorder device is again noted on the left.  The cardiac size is normal. There is mild aortic tortuosity and atherosclerosis with a stable mediastinum and no vascular congestion.  No new osseous findings. Dorsal  and ventral cervical fusion hardware is partially visible. Multilevel thoracic spine bridging enthesopathy.  IMPRESSION: 1. No evidence of acute chest disease. 2. 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 3. Aortic atherosclerosis.  Electronically Signed   By: Almira Bar M.D.   On: 08/01/2023 07:44   Previous GI Evaluations   *Most recent Colonoscopy October 2021 for polyp surveillance --One 2 mm polyp removed from the cecum.  --Three 3-6 mm polyps removed from the sigmoid and in the ascending colon --Two 2 to 3 mm polyps removed from the rectum -- Diverticulosis in the sigmoid colon and descending colon -- Nonbleeding internal hemorrhoids  Surgical [P], colon, cecum, polyp - TUBULAR ADENOMA. - NO HIGH GRADE DYSPLASIA OR CARCINOMA. 2. Surgical [P], colon, ascending, sigmoid, polyp (3) - TUBULAR ADENOMA(S). - NO HIGH GRADE DYSPLASIA OR CARCINOMA. 3. Surgical [P], colon,  rectum, polyp (2) - HYPERPLASTIC POLYP(S). - NO ADENOMATOUS CHANGE OR CARCINOMA.    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   History of colon polyps - adenomas 04/03/2010   Hyperlipidemia    Hypertension    Iron deficiency anemia, unspecified    OSA (obstructive sleep apnea)    refusing CPAP   Pneumonia 03/2022   w/effusion   Prostate cancer (HCC)    Stroke [facility])     Past Surgical History:  Procedure Laterality Date   ANTERIOR CERVICAL DECOMP/DISCECTOMY FUSION N/A 10/01/2012   Procedure: ANTERIOR CERVICAL DECOMPRESSION/DISCECTOMY FUSION 2 LEVELS;  Surgeon: Tia Alert, MD;  Location: [facility]UR[facility] ORS;  Service: Neurosurgery;  Laterality: N/A;  Cervical five-six,Cervical six-seven   COLONOSCOPY  09/30/2010, 03/01/2014   diverticulosis, small internal hemorrhoids   COLONOSCOPY W/ POLYPECTOMY  04/03/2010   4 polyps 8-44mm, worst TV adenoma with high-grade dysplasia   LOOP RECORDER INSERTION N/A 10/10/2022   Procedure: LOOP RECORDER INSERTION;  Surgeon: Marinus Maw, MD;  Location: [facility] INVASIVE CV LAB;  Service: Cardiovascular;  Laterality: N/A;   NO PAST SURGERIES     POLYPECTOMY     POSTERIOR CERVICAL FUSION/FORAMINOTOMY Left 10/20/2012   Procedure: C/5-6,C/6-7 Lami/Multi level,POSTERIOR CERVICAL FUSION/FORAMINOTOMY C/4-7;  Surgeon: Tia Alert, MD;  Location: [facility] NEUR[facility] ORS;  Service: Neurosurgery;  Laterality: Left;  Cervical Five-Six, Cervical Six-Seven Laminectomies, Posterior Cervical Fusion/Foraminotomies Cervical Four through Seven.    TONSILLECTOMY      Family History  Problem Relation Age of Onset   Diabetes Mother    Stomach cancer Neg Hx    Rectal cancer Neg Hx    Pancreatic cancer Neg Hx    Colon cancer Neg Hx    Colon polyps Neg Hx    Esophageal cancer Neg Hx    Prostate  cancer Neg Hx    Breast cancer Neg Hx     Prior to Admission medications   Medication Sig Start Date End Date Taking? Authorizing Provider  aspirin 81 MG chewable tablet Chew 1 tablet (81 mg total) by mouth daily. 04/08/23  Yes Marinda Elk, MD  atorvastatin (LIPITOR) 40 MG tablet Take 1 tablet (40 mg total) by mouth daily. Patient taking differently: Take 40 mg by mouth at bedtime. 10/17/22  Yes Love, Evlyn Kanner, PA-C  benazepril (LOTENSIN) 40 MG tablet Take 20 mg by mouth in the morning and at bedtime.  Yes [provider]  BRILINTA 90 MG TABS tablet Take 90 mg by mouth 2 (two) times daily. 04/24/23  Yes [provider]  Cholecalciferol (VITAMIN D3) 1000 units CAPS Take 1,000 Units by mouth in the morning and at bedtime.   Yes [provider]  escitalopram (LEXAPRO) 20 MG tablet Take 20 mg by mouth daily.   Yes [provider]  ezetimibe (ZETIA) 10 MG tablet Take 10 mg by mouth daily. 01/19/20  Yes [provider]  metFORMIN (GLUCOPHAGE-XR) 500 MG 24 hr tablet Take 500 mg by mouth in the morning and at bedtime.   Yes [provider]  tamsulosin (FLOMAX) 0.4 MG CAPS capsule Take 0.4 mg by mouth daily. 07/31/23  Yes [provider]  TOUJEO SOLOSTAR 300 UNIT/ML Solostar Pen Inject 40 Units into the skin in the morning. Patient taking differently: Inject 25 Units into the skin in the morning. 04/08/23  Yes Marinda Elk, MD  azithromycin (ZITHROMAX) 250 MG tablet Take 1 tablet (250 mg total) by mouth daily. Take first 2 tablets together, then 1 every day until finished. Patient not taking: Reported on 08/01/2023 04/11/23   Netta Corrigan, PA-C  chlorthalidone (HYGROTON) 25 MG tablet Take 25 mg by mouth daily.    [provider]  Continuous Glucose Sensor (FREESTYLE LIBRE 14 DAY SENSOR) MISC Inject 1 Device into the skin every 14 (fourteen) days.    [provider]  MOUNJARO 5 MG/0.5ML Pen Inject 5 mg into  the skin every Monday. Patient not taking: Reported on 08/01/2023    [provider]    Current Facility-Administered Medications  Medication Dose Route Frequency Provider Last Rate Last Admin   acetaminophen (TYLENOL) tablet 650 mg  650 mg Oral Q6H PRN Leroy Sea, MD       Or   acetaminophen (TYLENOL) suppository 650 mg  650 mg Rectal Q6H PRN Leroy Sea, MD       albuterol (PROVENTIL) (2.5 MG/3ML) 0.083% nebulizer solution 2.5 mg  2.5 mg Nebulization Q6H Leroy Sea, MD       aspirin chewable tablet 81 mg  81 mg Oral Daily Leroy Sea, MD       atorvastatin (LIPITOR) tablet 40 mg  40 mg Oral QHS Leroy Sea, MD       bisacodyl (DULCOLAX) EC tablet 5 mg  5 mg Oral Daily PRN Leroy Sea, MD       escitalopram (LEXAPRO) tablet 20 mg  20 mg Oral Daily Leroy Sea, MD       ezetimibe (ZETIA) tablet 10 mg  10 mg Oral Daily Leroy Sea, MD       FLUoxetine (PROZAC) capsule 10 mg  10 mg Oral Daily Leroy Sea, MD       insulin aspart (novoLOG) injection 0-15 Units  0-15 Units Subcutaneous TID WC Singh, Stanford Scotland, MD       insulin aspart (novoLOG) injection 0-5 Units  0-5 Units Subcutaneous QHS Leroy Sea, MD       [START ON 08/02/2023] insulin glargine (1 Unit Dial) (TOUJEO) Solostar Pen SOPN 30 Units  30 Units Subcutaneous q AM Leroy Sea, MD       lactated ringers infusion   Intravenous Continuous Leroy Sea, MD       magnesium sulfate IVPB 2 g 50 mL  2 g Intravenous Once Leroy Sea, MD       ondansetron [facility]) tablet 4 mg  4 mg Oral Q6H PRN Leroy Sea, MD       Or   ondansetron [facility]) injection 4 mg  4 mg Intravenous Q6H PRN Leroy Sea, MD       pantoprazole (PROTONIX) EC tablet 40 mg  40 mg Oral BID AC Leroy Sea, MD       tamsulosin (FLOMAX) capsule 0.4 mg  0.4 mg Oral Daily Leroy Sea, MD       ticagrelor (BRILINTA) tablet 90 mg  90 mg Oral BID Leroy Sea, MD        Current Outpatient Medications  Medication Sig Dispense Refill   aspirin 81 MG chewable tablet Chew 1 tablet (81 mg total) by mouth daily. 30 tablet 1   atorvastatin (LIPITOR) 40 MG tablet Take 1 tablet (40 mg total) by mouth daily. (Patient taking differently: Take 40 mg by mouth at bedtime.) 30 tablet 0   benazepril (LOTENSIN) 40 MG tablet Take 20 mg by mouth in the morning and at bedtime.     BRILINTA 90 MG TABS tablet Take 90 mg by mouth 2 (two) times daily.     Cholecalciferol (VITAMIN D3) 1000 units CAPS Take 1,000 Units by mouth in the morning and at bedtime.     escitalopram (LEXAPRO) 20 MG tablet Take 20 mg by mouth daily.     ezetimibe (ZETIA) 10 MG tablet Take 10 mg by mouth daily.     metFORMIN (GLUCOPHAGE-XR) 500 MG 24 hr tablet Take 500 mg by mouth in the morning and at bedtime.     tamsulosin (FLOMAX) 0.4 MG CAPS capsule Take 0.4 mg by mouth daily.     TOUJEO SOLOSTAR 300 UNIT/ML Solostar Pen Inject 40 Units into the skin in the morning. (Patient taking differently: Inject 25 Units into the skin in the morning.)     azithromycin (ZITHROMAX) 250 MG tablet Take 1 tablet (250 mg total) by mouth daily. Take first 2 tablets together, then 1 every day until finished. (Patient not taking: Reported on 08/01/2023) 6 tablet 0   chlorthalidone (HYGROTON) 25 MG tablet Take 25 mg by mouth daily.     Continuous Glucose Sensor (FREESTYLE LIBRE 14 DAY SENSOR) MISC Inject 1 Device into the skin every 14 (fourteen) days.     MOUNJARO 5 MG/0.5ML Pen Inject 5 mg into the skin every Monday. (Patient not taking: Reported on 08/01/2023)      Allergies as of 08/01/2023 - Review Complete 08/01/2023  Allergen Reaction Noted   Jardiance [empagliflozin] Other (See Comments) 10/08/2022   Fluoxetine Other (See Comments) 04/06/2023   Penicillins Other (See Comments) 09/23/2010   Silodosin Other (See Comments) 04/06/2023    Social History   Socioeconomic History   Marital status: Married    Spouse  name: Not on file   Number of children: Not on file   Years of education: Not on file   Highest education level: Not on file  Occupational History   Not on file  Tobacco Use   Smoking status: Former    Current packs/day: 0.00    Average packs/day: 0.3 packs/day for 2.0 years (0.5 ttl pk-yrs)    Types: Cigarettes    Start date: 09/23/1967    Quit date: 09/22/1969    Years since quitting: 53.8   Smokeless tobacco: Never  Vaping Use   Vaping status: Never Used  Substance and Sexual Activity   Alcohol use: No   Drug use: No   Sexual activity: Yes  Other Topics Concern  Not on file  Social History Narrative   Right handed    Wear glasses   Social Drivers of Health   Financial Resource Strain: Not on file  Food Insecurity: No Food Insecurity (04/13/2023)   Hunger Vital Sign    Worried About Running Out of Food in the Last Year: Never true    Ran Out of Food in the Last Year: Never true  Transportation Needs: No Transportation Needs (04/13/2023)   PRAPARE - Administrator, Civil Service (Medical): No    Lack of Transportation (Non-Medical): No  Physical Activity: Not on file  Stress: Not on file  Social Connections: Not on file  Intimate Partner Violence: Not At Risk (04/13/2023)   Humiliation, Afraid, Rape, and Kick questionnaire    Fear of Current or Ex-Partner: No    Emotionally Abused: No    Physically Abused: No    Sexually Abused: No     Code Status   Code Status: Full Code  Review of Systems: All systems reviewed and negative except where noted in HPI.  Physical Exam: Vital signs in last 24 hours: Temp:  [98.3 F (36.8 C)] 98.3 F (36.8 C) (03/22 1104) Pulse Rate:  [56-72] 72 (03/22 1100) Resp:  [10-23] 22 (03/22 1100) BP: (87-108)/(46-67) 108/46 (03/22 1100) SpO2:  [96 %-100 %] 100 % (03/22 1100) Weight:  [86.6 kg] 86.6 kg (03/22 1610)    General:  Pleasant male in NAD Psych:  Cooperative. Normal mood and affect Eyes: Pupils equal Ears:   Normal auditory acuity Nose: No deformity, discharge or lesions Neck:  Supple, no masses felt Lungs:  Clear to auscultation.  Heart:  Regular rate, regular rhythm.  Abdomen:  Soft, nondistended, nontender, active bowel sounds, no masses felt Rectal :  Deferred Msk: Symmetrical without gross deformities.  Neurologic:  Alert, oriented, grossly normal neurologically Extremities : No edema Skin:  Intact without significant lesions.    Intake/Output from previous day: No intake/output data recorded. Intake/Output this shift:  No intake/output data recorded.   Willette Cluster, NP-C   08/01/2023, 2:17 PM

## 2023-08-01 NOTE — ED Provider Notes (Signed)
 [place] EMERGENCY DEPARTMENT AT [facility] Provider Note   CSN: [id number] Arrival date & time: 08/01/23  1191     History  Chief Complaint  Patient presents with   Weakness    Kenneth [name] is a 73 y.o. male.  73 year old male with past medical history significant of CVA, CHF presents today for concern of weakness.  His wife is at bedside and adds to the history.  Patient states the weakness significantly got worse this morning.  He ambulated to the restroom on his own but he was unable to get back and the nurse aide helped him return to the bedroom.  He was found to be hypotensive with EMS. According to his wife about 10 days ago he was at the [employer] and he had a mechanical fall where he sprained his ankle.  Since then he has been using a walker.  Patient believes that he has been having significant intake including water however his wife disagrees.  He denies any chest pain, shortness of breath, abdominal pain, nausea, vomiting, diarrhea, dysuria.  The history is provided by the patient. No language interpreter was used.       Home Medications Prior to Admission medications   Medication Sig Start Date End Date Taking? Authorizing Provider  aspirin 81 MG chewable tablet Chew 1 tablet (81 mg total) by mouth daily. 04/08/23   Marinda Elk, MD  atorvastatin (LIPITOR) 40 MG tablet Take 1 tablet (40 mg total) by mouth daily. Patient taking differently: Take 40 mg by mouth at bedtime. 10/17/22   Love, Evlyn Kanner, PA-C  azithromycin (ZITHROMAX) 250 MG tablet Take 1 tablet (250 mg total) by mouth daily. Take first 2 tablets together, then 1 every day until finished. 04/11/23   Netta Corrigan, PA-C  benazepril (LOTENSIN) 40 MG tablet Take 20 mg by mouth in the morning and at bedtime.    [provider]  BRILINTA 90 MG TABS tablet Take 90 mg by mouth 2 (two) times daily. 04/24/23   [provider]  chlorthalidone (HYGROTON) 25 MG tablet Take  25 mg by mouth daily.    [provider]  Cholecalciferol (VITAMIN D3) 1000 units CAPS Take 1,000 Units by mouth in the morning and at bedtime.    [provider]  Continuous Glucose Sensor (FREESTYLE LIBRE 14 DAY SENSOR) MISC Inject 1 Device into the skin every 14 (fourteen) days.    [provider]  ezetimibe (ZETIA) 10 MG tablet Take 10 mg by mouth daily. 01/19/20   [provider]  FLUoxetine (PROZAC) 10 MG capsule Take 10 mg by mouth daily.    [provider]  metFORMIN (GLUCOPHAGE-XR) 500 MG 24 hr tablet Take 500 mg by mouth in the morning and at bedtime.    [provider]  MOUNJARO 5 MG/0.5ML Pen Inject 5 mg into the skin every Monday.    [provider]  TOUJEO SOLOSTAR 300 UNIT/ML Solostar Pen Inject 40 Units into the skin in the morning. Patient taking differently: Inject 30 Units into the skin in the morning. 04/08/23   Marinda Elk, MD      Allergies    Jardiance [empagliflozin], Fluoxetine, Penicillins, and Silodosin    Review of Systems   Review of Systems  Constitutional:  Negative for chills and fever.  Eyes:  Negative for visual disturbance.  Respiratory:  Negative for cough and shortness of breath.   Cardiovascular:  Negative for chest pain.  Gastrointestinal:  Negative for  abdominal pain, diarrhea, nausea and vomiting.  Neurological:  Positive for weakness. Negative for light-headedness and headaches.  All other systems reviewed and are negative.   Physical Exam Updated Vital Signs BP (!) 104/47   Pulse 63   Temp 98.3 F (36.8 C) (Oral)   Resp (!) 23   Ht 5\' 11"  (1.803 m)   Wt 86.6 kg   SpO2 96%   BMI 26.64 kg/m  Physical Exam Vitals and nursing note reviewed.  Constitutional:      General: He is not in acute distress.    Appearance: Normal appearance. He is not ill-appearing.  HENT:     Head: Normocephalic and atraumatic.     Nose: Nose normal.  Eyes:     Conjunctiva/sclera:  Conjunctivae normal.  Cardiovascular:     Rate and Rhythm: Normal rate and regular rhythm.  Pulmonary:     Effort: Pulmonary effort is normal. No respiratory distress.  Musculoskeletal:        General: No deformity.  Skin:    Findings: No rash.  Neurological:     Mental Status: He is alert.     ED Results / Procedures / Treatments   Labs (all labs ordered are listed, but only abnormal results are displayed) Labs Reviewed  RESP PANEL BY RT-PCR (RSV, FLU A&B, COVID)  RVPGX2  BRAIN NATRIURETIC PEPTIDE  CBC WITH DIFFERENTIAL/PLATELET  MAGNESIUM  COMPREHENSIVE METABOLIC PANEL  LIPASE, BLOOD  URINALYSIS, ROUTINE W REFLEX MICROSCOPIC  TROPONIN I (HIGH SENSITIVITY)    EKG EKG Interpretation Date/Time:  Saturday August 01 2023 06:27:44 EDT Ventricular Rate:  66 PR Interval:  159 QRS Duration:  92 QT Interval:  447 QTC Calculation: 469 R Axis:   55  Text Interpretation: Sinus rhythm Atrial premature complex Low voltage, precordial leads Anteroseptal infarct, age indeterminate No significant change since last tracing Confirmed by Zadie Rhine ([id number]) on 08/01/2023 6:47:02 AM  Radiology DG Chest Port 1 View Result Date: 08/01/2023 CLINICAL DATA:  Generalized weakness, onset 8 p.m. last night. EXAM: PORTABLE CHEST 1 VIEW COMPARISON:  AP Lat chest 04/11/2023 FINDINGS: There is chronic thickening of lower lateral right pleura, associated chronic pleuroparenchymal disease in the lateral right base. The lungs are otherwise clear. A loop recorder device is again noted on the left. The cardiac size is normal. There is mild aortic tortuosity and atherosclerosis with a stable mediastinum and no vascular congestion. No new osseous findings. Dorsal and ventral cervical fusion hardware is partially visible. Multilevel thoracic spine bridging enthesopathy. IMPRESSION: 1. No evidence of acute chest disease. 2. 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 3. Aortic atherosclerosis.  Electronically Signed   By: Almira Bar M.D.   On: 08/01/2023 07:44    Procedures Procedures    Medications Ordered in ED Medications  lactated ringers bolus 1,000 mL (1,000 mLs Intravenous New Bag/Given 08/01/23 0726)    ED Course/ Medical Decision Making/ A&P                                 Medical Decision Making Amount and/or Complexity of Data Reviewed Labs: ordered. Radiology: ordered.  Risk Prescription drug management.   Medical Decision Making / ED Course   This patient presents to the ED for concern of weakness, this involves an extensive number of treatment options, and is a complaint that carries with it a high risk of complications and morbidity.  The differential diagnosis includes CVA, TIA, ACS, pneumonia,  UTI, gastroenteritis, metabolic derangement  MDM: 73 year old male presents today for concern of weakness.  Weakness worse this morning.  He was unable to return after using the bathroom.  Required assistance which is unusual for him.  He is alert and oriented.  He was found to be hypotensive with EMS.  Improved after 500 mL bolus.  Admission considered but will reevaluate after labs and imaging.  Will obtain broad workup, obtain chest x-ray, provide fluids and reevaluate.  CBC without leukocytosis.  Hemoglobin of 9 which is decreased from his baseline of 12.5.  No overt signs of bleeding.  He denies melanotic stools.  Hemoccult negative.  CMP shows glucose of 168, sodium of 132 otherwise without acute concerns.  BNP within normal.  Lipase within normal.  Troponin negative.  Magnesium 1.4.  IV magnesium repletion given.  Discussed with gastroenterology regarding the anemia.  They will consult on patient while he is admitted.  Discussed with hospitalist will evaluate patient for admission.   Additional history obtained: -Additional history obtained from wife at bedside -External records from outside source obtained and reviewed including: Chart review  including previous notes, labs, imaging, consultation notes   Lab Tests: -I ordered, reviewed, and interpreted labs.   The pertinent results include:   Labs Reviewed  RESP PANEL BY RT-PCR (RSV, FLU A&B, COVID)  RVPGX2  BRAIN NATRIURETIC PEPTIDE  CBC WITH DIFFERENTIAL/PLATELET  MAGNESIUM  COMPREHENSIVE METABOLIC PANEL  LIPASE, BLOOD  URINALYSIS, ROUTINE W REFLEX MICROSCOPIC  TROPONIN I (HIGH SENSITIVITY)      EKG  EKG Interpretation Date/Time:  Saturday August 01 2023 06:27:44 EDT Ventricular Rate:  66 PR Interval:  159 QRS Duration:  92 QT Interval:  447 QTC Calculation: 469 R Axis:   55  Text Interpretation: Sinus rhythm Atrial premature complex Low voltage, precordial leads Anteroseptal infarct, age indeterminate No significant change since last tracing Confirmed by Zadie Rhine ([id number]) on 08/01/2023 6:47:02 AM         Imaging Studies ordered: I ordered imaging studies including chest x-ray I independently visualized and interpreted imaging. I agree with the radiologist interpretation   Medicines ordered and prescription drug management: Meds ordered this encounter  Medications   lactated ringers bolus 1,000 mL    -I have reviewed the patients home medicines and have made adjustments as needed   Consultations Obtained: I requested consultation with the gastroenterology,  and discussed lab and imaging findings as well as pertinent plan - they recommend: As above  Reevaluation: After the interventions noted above, I reevaluated the patient and found that they have :stayed the same  Co morbidities that complicate the patient evaluation  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  Final Clinical Impression(s) / ED Diagnoses Final diagnoses:  Weakness  Anemia, unspecified type    Rx / DC Orders ED Discharge Orders     None  Marita Kansas, PA-C 08/01/23 1146    Lonell Grandchild, MD 08/01/23 1540

## 2023-08-01 NOTE — Progress Notes (Signed)
 Patient finally eating dinner and talking with family. New medication orders placed at 1945. Medications that were ordered for 1945 rescheduled to be given with night medications.

## 2023-08-01 NOTE — ED Triage Notes (Signed)
 Patient arrives [facility] from home for generalized weakness starting at 8pm last night. EMS found patient pale and hypotensive at 83/49. NS given PTA. Patient denies chest pain, shortness of breath, or blood in stool.   BP 91/47 HR 72 SPO2 94%

## 2023-08-01 NOTE — H&P (View-Only) (Signed)
 Consultation Note   Referring Provider:  Emergency Services PCP: [name], MD Primary Gastroenterologist::  Doristine Locks, MD        Reason for Consultation:  Anemia DOA: 08/01/2023         Hospital Day: 1   ASSESSMENT    73 y.o. year old male with a history of CVA on DAPT (Brillinta + baby asa) admitted with weakness, hypotension and worsening of chronic anemia. No overt GI bleeding /  FOBT negative. No GI symptoms other than new loud belching. Cause of worsening anemia unclear.   History of colon polyps.  Multiple small TAs at time of surveillance colonoscopy in Oct 2021. Five year recall recommended  Low magnesium level  Hx of CVA May 2024 OSA DM2 Hypertension Prostate cancer See PMH for additional history     PLAN:   --IV fluids, Mg+ repletion in progress --Empiric Pantoprazole BID ( already ordered) --Monitor H/H, transfuse if needed.  --Will consider doing an EGD given the need for DAPT. I don't know that he will need colonoscopy this admission   HPI   Patient brought to ED today by EMS for weakness.  Found to be hypotensive and given 500 mL of normal saline prior to hospital arrival  Patient has chronic mild normocytic anemia.  Baseline hemoglobin in the low 12 range.  In ED hemoglobin 9, platelet 87.  Ferritin 577, TIBC low along with a low iron saturation.  FOBT negative. Total bilirubin 1.9, Alk Phos and transaminases normal.  Thyroid studies normal.  No acute findings on chest x-ray  Patient hasn't had seen any blood in his stools or has black stools.  No abdominal pain.  No nausea or vomiting.  Wife does mention that he has recently started belching loudly.  Patient takes a daily baby aspirin, no other NSAIDs.  He is not on an acid blocker at home. Has Mounjaro on home med list but reportedly not taking it.   Labs and Imaging:  Recent Labs    08/01/23 0630  WBC 7.9  HGB 9.0*  HCT 26.8*  MCV 89.9   PLT 87*   Recent Labs    08/01/23 1310  FERRITIN 577*  TIBC 217*  IRONPCTSAT 17*   Recent Labs    08/01/23 0630  NA 132*  K 4.1  CL 101  CO2 21*  GLUCOSE 168*  BUN 17  CREATININE 0.87  CALCIUM 8.1*   Recent Labs    08/01/23 0630  PROT 4.7*  ALBUMIN 2.6*  AST 26  ALT 15  ALKPHOS 43  BILITOT 1.9*   No results for input(s): "INR" in the last 72 hours. No results for input(s): "AFPTUMOR" in the last 72 hours.  DG Chest Port 1 View CLINICAL DATA:  Generalized weakness, onset 8 p.m. last night.  EXAM: PORTABLE CHEST 1 VIEW  COMPARISON:  AP Lat chest 04/11/2023  FINDINGS: There is chronic thickening of lower lateral right pleura, associated chronic pleuroparenchymal disease in the lateral right base.  The lungs are otherwise clear. A loop recorder device is again noted on the left.  The cardiac size is normal. There is mild aortic tortuosity and atherosclerosis with a stable mediastinum and no vascular congestion.  No new osseous findings. Dorsal  and ventral cervical fusion hardware is partially visible. Multilevel thoracic spine bridging enthesopathy.  IMPRESSION: 1. No evidence of acute chest disease. 2. 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 3. Aortic atherosclerosis.  Electronically Signed   By: Almira Bar M.D.   On: 08/01/2023 07:44   Previous GI Evaluations   *Most recent Colonoscopy October 2021 for polyp surveillance --One 2 mm polyp removed from the cecum.  --Three 3-6 mm polyps removed from the sigmoid and in the ascending colon --Two 2 to 3 mm polyps removed from the rectum -- Diverticulosis in the sigmoid colon and descending colon -- Nonbleeding internal hemorrhoids  Surgical [P], colon, cecum, polyp - TUBULAR ADENOMA. - NO HIGH GRADE DYSPLASIA OR CARCINOMA. 2. Surgical [P], colon, ascending, sigmoid, polyp (3) - TUBULAR ADENOMA(S). - NO HIGH GRADE DYSPLASIA OR CARCINOMA. 3. Surgical [P], colon,  rectum, polyp (2) - HYPERPLASTIC POLYP(S). - NO ADENOMATOUS CHANGE OR CARCINOMA.    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   History of colon polyps - adenomas 04/03/2010   Hyperlipidemia    Hypertension    Iron deficiency anemia, unspecified    OSA (obstructive sleep apnea)    refusing CPAP   Pneumonia 03/2022   w/effusion   Prostate cancer (HCC)    Stroke [facility])     Past Surgical History:  Procedure Laterality Date   ANTERIOR CERVICAL DECOMP/DISCECTOMY FUSION N/A 10/01/2012   Procedure: ANTERIOR CERVICAL DECOMPRESSION/DISCECTOMY FUSION 2 LEVELS;  Surgeon: Tia Alert, MD;  Location: [facility]UR[facility] OR[facility];  Service: Neurosurgery;  Laterality: N/A;  Cervical five-six,Cervical six-seven   COLONOSCOPY  09/30/2010, 03/01/2014   diverticulosis, small internal hemorrhoids   COLONOSCOPY W/ POLYPECTOMY  04/03/2010   4 polyps 8-44mm, worst TV adenoma with high-grade dysplasia   LOOP RECORDER INSERTION N/A 10/10/2022   Procedure: LOOP RECORDER INSERTION;  Surgeon: Marinus Maw, MD;  Location: [facility] INVASIVE CV [facility];  Service: Cardiovascular;  Laterality: N/A;   NO PAST SURGERIES     POLYPECTOMY     POSTERIOR CERVICAL FUSION/FORAMINOTOMY Left 10/20/2012   Procedure: C/5-6,C/6-7 Lami/Multi level,POSTERIOR CERVICAL FUSION/FORAMINOTOMY C/4-7;  Surgeon: Tia Alert, MD;  Location: [facility]UR[facility];  Service: Neurosurgery;  Laterality: Left;  Cervical Five-Six, Cervical Six-Seven Laminectomies, Posterior Cervical Fusion/Foraminotomies Cervical Four through Seven.    TONSILLECTOMY      Family History  Problem Relation Age of Onset   Diabetes Mother    Stomach cancer Neg Hx    Rectal cancer Neg Hx    Pancreatic cancer Neg Hx    Colon cancer Neg Hx    Colon polyps Neg Hx    Esophageal cancer Neg Hx    Prostate  cancer Neg Hx    Breast cancer Neg Hx     Prior to Admission medications   Medication Sig Start Date End Date Taking? Authorizing Provider  aspirin 81 MG chewable tablet Chew 1 tablet (81 mg total) by mouth daily. 04/08/23  Yes Marinda Elk, MD  atorvastatin (LIPITOR) 40 MG tablet Take 1 tablet (40 mg total) by mouth daily. Patient taking differently: Take 40 mg by mouth at bedtime. 10/17/22  Yes Love, Evlyn Kanner, PA-C  benazepril (LOTENSIN) 40 MG tablet Take 20 mg by mouth in the morning and at bedtime.  Yes [provider]  BRILINTA 90 MG TABS tablet Take 90 mg by mouth 2 (two) times daily. 04/24/23  Yes [provider]  Cholecalciferol (VITAMIN D3) 1000 units CAPS Take 1,000 Units by mouth in the morning and at bedtime.   Yes [provider]  escitalopram (LEXAPRO) 20 MG tablet Take 20 mg by mouth daily.   Yes [provider]  ezetimibe (ZETIA) 10 MG tablet Take 10 mg by mouth daily. 01/19/20  Yes [provider]  metFORMIN (GLUCOPHAGE-XR) 500 MG 24 hr tablet Take 500 mg by mouth in the morning and at bedtime.   Yes [provider]  tamsulosin (FLOMAX) 0.4 MG CAPS capsule Take 0.4 mg by mouth daily. 07/31/23  Yes [provider]  TOUJEO SOLOSTAR 300 UNIT/ML Solostar Pen Inject 40 Units into the skin in the morning. Patient taking differently: Inject 25 Units into the skin in the morning. 04/08/23  Yes Marinda Elk, MD  azithromycin (ZITHROMAX) 250 MG tablet Take 1 tablet (250 mg total) by mouth daily. Take first 2 tablets together, then 1 every day until finished. Patient not taking: Reported on 08/01/2023 04/11/23   Netta Corrigan, PA-C  chlorthalidone (HYGROTON) 25 MG tablet Take 25 mg by mouth daily.    [provider]  Continuous Glucose Sensor (FREESTYLE LIBRE 14 DAY SENSOR) MISC Inject 1 Device into the skin every 14 (fourteen) days.    [provider]  MOUNJARO 5 MG/0.5ML Pen Inject 5 mg into  the skin every Monday. Patient not taking: Reported on 08/01/2023    [provider]    Current Facility-Administered Medications  Medication Dose Route Frequency Provider Last Rate Last Admin   acetaminophen (TYLENOL) tablet 650 mg  650 mg Oral Q6H PRN Leroy Sea, MD       Or   acetaminophen (TYLENOL) suppository 650 mg  650 mg Rectal Q6H PRN Leroy Sea, MD       albuterol (PROVENTIL) (2.5 MG/3ML) 0.083% nebulizer solution 2.5 mg  2.5 mg Nebulization Q6H Leroy Sea, MD       aspirin chewable tablet 81 mg  81 mg Oral Daily Leroy Sea, MD       atorvastatin (LIPITOR) tablet 40 mg  40 mg Oral QHS Leroy Sea, MD       bisacodyl (DULCOLAX) EC tablet 5 mg  5 mg Oral Daily PRN Leroy Sea, MD       escitalopram (LEXAPRO) tablet 20 mg  20 mg Oral Daily Leroy Sea, MD       ezetimibe (ZETIA) tablet 10 mg  10 mg Oral Daily Leroy Sea, MD       FLUoxetine (PROZAC) capsule 10 mg  10 mg Oral Daily Leroy Sea, MD       insulin aspart (novoLOG) injection 0-15 Units  0-15 Units Subcutaneous TID WC Singh, Stanford Scotland, MD       insulin aspart (novoLOG) injection 0-5 Units  0-5 Units Subcutaneous QHS Leroy Sea, MD       [START ON 08/02/2023] insulin glargine (1 Unit Dial) (TOUJEO) Solostar Pen SOPN 30 Units  30 Units Subcutaneous q AM Leroy Sea, MD       lactated ringers infusion   Intravenous Continuous Leroy Sea, MD       magnesium sulfate IVPB 2 g 50 mL  2 g Intravenous Once Leroy Sea, MD       ondansetron [facility]) tablet 4 mg  4 mg Oral Q6H PRN Leroy Sea, MD       Or   ondansetron [facility]) injection 4 mg  4 mg Intravenous Q6H PRN Leroy Sea, MD       pantoprazole (PROTONIX) EC tablet 40 mg  40 mg Oral BID AC Leroy Sea, MD       tamsulosin (FLOMAX) capsule 0.4 mg  0.4 mg Oral Daily Leroy Sea, MD       ticagrelor (BRILINTA) tablet 90 mg  90 mg Oral BID Leroy Sea, MD        Current Outpatient Medications  Medication Sig Dispense Refill   aspirin 81 MG chewable tablet Chew 1 tablet (81 mg total) by mouth daily. 30 tablet 1   atorvastatin (LIPITOR) 40 MG tablet Take 1 tablet (40 mg total) by mouth daily. (Patient taking differently: Take 40 mg by mouth at bedtime.) 30 tablet 0   benazepril (LOTENSIN) 40 MG tablet Take 20 mg by mouth in the morning and at bedtime.     BRILINTA 90 MG TABS tablet Take 90 mg by mouth 2 (two) times daily.     Cholecalciferol (VITAMIN D3) 1000 units CAPS Take 1,000 Units by mouth in the morning and at bedtime.     escitalopram (LEXAPRO) 20 MG tablet Take 20 mg by mouth daily.     ezetimibe (ZETIA) 10 MG tablet Take 10 mg by mouth daily.     metFORMIN (GLUCOPHAGE-XR) 500 MG 24 hr tablet Take 500 mg by mouth in the morning and at bedtime.     tamsulosin (FLOMAX) 0.4 MG CAPS capsule Take 0.4 mg by mouth daily.     TOUJEO SOLOSTAR 300 UNIT/ML Solostar Pen Inject 40 Units into the skin in the morning. (Patient taking differently: Inject 25 Units into the skin in the morning.)     azithromycin (ZITHROMAX) 250 MG tablet Take 1 tablet (250 mg total) by mouth daily. Take first 2 tablets together, then 1 every day until finished. (Patient not taking: Reported on 08/01/2023) 6 tablet 0   chlorthalidone (HYGROTON) 25 MG tablet Take 25 mg by mouth daily.     Continuous Glucose Sensor (FREESTYLE LIBRE 14 DAY SENSOR) MISC Inject 1 Device into the skin every 14 (fourteen) days.     MOUNJARO 5 MG/0.5ML Pen Inject 5 mg into the skin every Monday. (Patient not taking: Reported on 08/01/2023)      Allergies as of 08/01/2023 - Review Complete 08/01/2023  Allergen Reaction Noted   Jardiance [empagliflozin] Other (See Comments) 10/08/2022   Fluoxetine Other (See Comments) 04/06/2023   Penicillins Other (See Comments) 09/23/2010   Silodosin Other (See Comments) 04/06/2023    Social History   Socioeconomic History   Marital status: Married    Spouse  name: Not on file   Number of children: Not on file   Years of education: Not on file   Highest education level: Not on file  Occupational History   Not on file  Tobacco Use   Smoking status: Former    Current packs/day: 0.00    Average packs/day: 0.3 packs/day for 2.0 years (0.5 ttl pk-yrs)    Types: Cigarettes    Start date: 09/23/1967    Quit date: 09/22/1969    Years since quitting: 53.8   Smokeless tobacco: Never  Vaping Use   Vaping status: Never Used  Substance and Sexual Activity   Alcohol use: No   Drug use: No   Sexual activity: Yes  Other Topics Concern  Not on file  Social History Narrative   Right handed    Wear glasses   Social Drivers of Health   Financial Resource Strain: Not on file  Food Insecurity: No Food Insecurity (04/13/2023)   Hunger Vital Sign    Worried About Running Out of Food in the Last Year: Never true    Ran Out of Food in the Last Year: Never true  Transportation Needs: No Transportation Needs (04/13/2023)   PRAPARE - Administrator, Civil Service (Medical): No    Lack of Transportation (Non-Medical): No  Physical Activity: Not on file  Stress: Not on file  Social Connections: Not on file  Intimate Partner Violence: Not At Risk (04/13/2023)   Humiliation, Afraid, Rape, and Kick questionnaire    Fear of Current or Ex-Partner: No    Emotionally Abused: No    Physically Abused: No    Sexually Abused: No     Code Status   Code Status: Full Code  Review of Systems: All systems reviewed and negative except where noted in HPI.  Physical Exam: Vital signs in last 24 hours: Temp:  [98.3 F (36.8 C)] 98.3 F (36.8 C) (03/22 1104) Pulse Rate:  [56-72] 72 (03/22 1100) Resp:  [10-23] 22 (03/22 1100) BP: (87-108)/(46-67) 108/46 (03/22 1100) SpO2:  [96 %-100 %] 100 % (03/22 1100) Weight:  [86.6 kg] 86.6 kg (03/22 1610)    General:  Pleasant male in NAD Psych:  Cooperative. Normal mood and affect Eyes: Pupils equal Ears:   Normal auditory acuity Nose: No deformity, discharge or lesions Neck:  Supple, no masses felt Lungs:  Clear to auscultation.  Heart:  Regular rate, regular rhythm.  Abdomen:  Soft, nondistended, nontender, active bowel sounds, no masses felt Rectal :  Deferred Msk: Symmetrical without gross deformities.  Neurologic:  Alert, oriented, grossly normal neurologically Extremities : No edema Skin:  Intact without significant lesions.    Intake/Output from previous day: No intake/output data recorded. Intake/Output this shift:  No intake/output data recorded.   Willette Cluster, NP-C   08/01/2023, 2:17 PM

## 2023-08-02 ENCOUNTER — Encounter (HOSPITAL_COMMUNITY): Admission: EM | Disposition: E | Payer: Self-pay | Source: Home / Self Care | Attending: Internal Medicine

## 2023-08-02 ENCOUNTER — Inpatient Hospital Stay (HOSPITAL_COMMUNITY): Admitting: Certified Registered Nurse Anesthetist

## 2023-08-02 DIAGNOSIS — K2990 Gastroduodenitis, unspecified, without bleeding: Secondary | ICD-10-CM | POA: Diagnosis not present

## 2023-08-02 DIAGNOSIS — K449 Diaphragmatic hernia without obstruction or gangrene: Secondary | ICD-10-CM | POA: Diagnosis not present

## 2023-08-02 DIAGNOSIS — K31A11 Gastric intestinal metaplasia without dysplasia, involving the antrum: Secondary | ICD-10-CM

## 2023-08-02 DIAGNOSIS — K3189 Other diseases of stomach and duodenum: Secondary | ICD-10-CM

## 2023-08-02 DIAGNOSIS — Z8673 Personal history of transient ischemic attack (TIA), and cerebral infarction without residual deficits: Secondary | ICD-10-CM

## 2023-08-02 DIAGNOSIS — D62 Acute posthemorrhagic anemia: Secondary | ICD-10-CM | POA: Diagnosis not present

## 2023-08-02 DIAGNOSIS — D696 Thrombocytopenia, unspecified: Secondary | ICD-10-CM

## 2023-08-02 DIAGNOSIS — K2970 Gastritis, unspecified, without bleeding: Secondary | ICD-10-CM

## 2023-08-02 DIAGNOSIS — D649 Anemia, unspecified: Secondary | ICD-10-CM | POA: Diagnosis not present

## 2023-08-02 DIAGNOSIS — R531 Weakness: Principal | ICD-10-CM

## 2023-08-02 HISTORY — PX: ESOPHAGOGASTRODUODENOSCOPY: SHX5428

## 2023-08-02 LAB — COMPREHENSIVE METABOLIC PANEL
ALT: 17 U/L (ref 0–44)
AST: 19 U/L (ref 15–41)
Albumin: 2.5 g/dL — ABNORMAL LOW (ref 3.5–5.0)
Alkaline Phosphatase: 43 U/L (ref 38–126)
Anion gap: 9 (ref 5–15)
BUN: 12 mg/dL (ref 8–23)
CO2: 22 mmol/L (ref 22–32)
Calcium: 8.3 mg/dL — ABNORMAL LOW (ref 8.9–10.3)
Chloride: 100 mmol/L (ref 98–111)
Creatinine, Ser: 0.81 mg/dL (ref 0.61–1.24)
GFR, Estimated: >60 mL/min (ref >60)
Glucose, Bld: 143 mg/dL — ABNORMAL HIGH (ref 70–99)
Potassium: 3.7 mmol/L (ref 3.5–5.1)
Sodium: 131 mmol/L — ABNORMAL LOW (ref 135–145)
Total Bilirubin: 1.4 mg/dL — ABNORMAL HIGH (ref 0.0–1.2)
Total Protein: 4.8 g/dL — ABNORMAL LOW (ref 6.5–8.1)

## 2023-08-02 LAB — CBC WITH DIFFERENTIAL/PLATELET
Abs Immature Granulocytes: 0 10*3/uL (ref 0.00–0.07)
Basophils Absolute: 0 10*3/uL (ref 0.0–0.1)
Basophils Relative: 0 %
Eosinophils Absolute: 0 10*3/uL (ref 0.0–0.5)
Eosinophils Relative: 0 %
HCT: 25 % — ABNORMAL LOW (ref 39.0–52.0)
Hemoglobin: 8.7 g/dL — ABNORMAL LOW (ref 13.0–17.0)
Lymphocytes Relative: 3 %
Lymphs Abs: 0.2 10*3/uL — ABNORMAL LOW (ref 0.7–4.0)
MCH: 29.7 pg (ref 26.0–34.0)
MCHC: 34.8 g/dL (ref 30.0–36.0)
MCV: 85.3 fL (ref 80.0–100.0)
Monocytes Absolute: 0.5 10*3/uL (ref 0.1–1.0)
Monocytes Relative: 6 %
Neutro Abs: 7.1 10*3/uL (ref 1.7–7.7)
Neutrophils Relative %: 91 %
Platelets: 107 10*3/uL — ABNORMAL LOW (ref 150–400)
RBC: 2.93 MIL/uL — ABNORMAL LOW (ref 4.22–5.81)
RDW: 14.8 % (ref 11.5–15.5)
WBC: 7.8 10*3/uL (ref 4.0–10.5)
nRBC: 0 % (ref 0.0–0.2)
nRBC: 0 /100{WBCs}

## 2023-08-02 LAB — PHOSPHORUS: Phosphorus: 3.6 mg/dL (ref 2.5–4.6)

## 2023-08-02 LAB — GLUCOSE, CAPILLARY
Glucose-Capillary: 124 mg/dL — ABNORMAL HIGH (ref 70–99)
Glucose-Capillary: 142 mg/dL — ABNORMAL HIGH (ref 70–99)
Glucose-Capillary: 153 mg/dL — ABNORMAL HIGH (ref 70–99)
Glucose-Capillary: 141 mg/dL — ABNORMAL HIGH (ref 70–99)
Glucose-Capillary: 131 mg/dL — ABNORMAL HIGH (ref 70–99)

## 2023-08-02 LAB — OSMOLALITY, URINE: Osmolality, Ur: 784 mosm/kg (ref 300–900)

## 2023-08-02 LAB — BRAIN NATRIURETIC PEPTIDE: B Natriuretic Peptide: 72.3 pg/mL (ref 0.0–100.0)

## 2023-08-02 LAB — SODIUM, URINE, RANDOM: Sodium, Ur: 151 mmol/L

## 2023-08-02 LAB — MAGNESIUM: Magnesium: 1.7 mg/dL (ref 1.7–2.4)

## 2023-08-02 LAB — URIC ACID: Uric Acid, Serum: 7.9 mg/dL (ref 3.7–8.6)

## 2023-08-02 SURGERY — EGD (ESOPHAGOGASTRODUODENOSCOPY)
Anesthesia: Monitor Anesthesia Care

## 2023-08-02 MED ORDER — PHENOL 1.4 % MT LIQD
1.0000 | OROMUCOSAL | Status: DC | PRN
  Administered 2023-08-02 – 2023-08-04 (×3): 1 via OROMUCOSAL
  Filled 2023-08-02: qty 177

## 2023-08-02 MED ORDER — SODIUM CHLORIDE 0.9 % IV SOLN
INTRAVENOUS | Status: AC | PRN
  Administered 2023-08-02: 500 mL via INTRAMUSCULAR

## 2023-08-02 MED ORDER — CYANOCOBALAMIN 1000 MCG/ML IJ SOLN
1000.0000 ug | Freq: Every day | INTRAMUSCULAR | Status: AC
  Administered 2023-08-02 – 2023-08-04 (×3): 1000 ug via SUBCUTANEOUS
  Filled 2023-08-02 (×3): qty 1

## 2023-08-02 MED ORDER — FERROUS SULFATE 325 (65 FE) MG PO TABS
325.0000 mg | ORAL_TABLET | Freq: Two times a day (BID) | ORAL | Status: DC
  Administered 2023-08-02 – 2023-08-06 (×8): 325 mg via ORAL
  Filled 2023-08-02 (×9): qty 1

## 2023-08-02 MED ORDER — LIDOCAINE 2% (20 MG/ML) 5 ML SYRINGE
INTRAMUSCULAR | Status: DC | PRN
  Administered 2023-08-02: 60 mg via INTRAVENOUS

## 2023-08-02 MED ORDER — PHENYLEPHRINE HCL-NACL 20-0.9 MG/250ML-% IV SOLN: INTRAVENOUS | Status: DC | PRN

## 2023-08-02 MED ORDER — VITAMIN B-12 1000 MCG PO TABS
1000.0000 ug | ORAL_TABLET | Freq: Every day | ORAL | Status: DC
  Administered 2023-08-05 – 2023-08-06 (×2): 1000 ug via ORAL
  Filled 2023-08-02 (×2): qty 1

## 2023-08-02 MED ORDER — PROPOFOL 10 MG/ML IV BOLUS
INTRAVENOUS | Status: DC | PRN
  Administered 2023-08-02 (×4): 20 mg via INTRAVENOUS
  Administered 2023-08-02: 100 ug/kg/min via INTRAVENOUS

## 2023-08-02 MED ORDER — ALBUTEROL SULFATE (2.5 MG/3ML) 0.083% IN NEBU
2.5000 mg | INHALATION_SOLUTION | Freq: Three times a day (TID) | RESPIRATORY_TRACT | Status: DC
  Administered 2023-08-02 – 2023-08-04 (×6): 2.5 mg via RESPIRATORY_TRACT
  Filled 2023-08-02 (×7): qty 3

## 2023-08-02 MED ORDER — INSULIN GLARGINE 100 UNIT/ML ~~LOC~~ SOLN
30.0000 [IU] | Freq: Every day | SUBCUTANEOUS | Status: DC
  Administered 2023-08-02: 30 [IU] via SUBCUTANEOUS
  Filled 2023-08-02 (×2): qty 0.3

## 2023-08-02 MED ORDER — FOLIC ACID 1 MG PO TABS
1.0000 mg | ORAL_TABLET | Freq: Every day | ORAL | Status: DC
  Administered 2023-08-02 – 2023-08-06 (×5): 1 mg via ORAL
  Filled 2023-08-02 (×5): qty 1

## 2023-08-02 MED ORDER — PHENYLEPHRINE 80 MCG/ML (10ML) SYRINGE FOR IV PUSH (FOR BLOOD PRESSURE SUPPORT)
PREFILLED_SYRINGE | INTRAVENOUS | Status: DC | PRN
  Administered 2023-08-02 (×2): 80 ug via INTRAVENOUS
  Administered 2023-08-02 (×2): 120 ug via INTRAVENOUS

## 2023-08-02 MED ORDER — MENTHOL 3 MG MT LOZG: 1.0000 | LOZENGE | OROMUCOSAL | Status: DC | PRN

## 2023-08-02 NOTE — Plan of Care (Signed)
  Problem: Coping: Goal: Ability to adjust to condition or change in health will improve Outcome: Progressing   Problem: Skin Integrity: Goal: Risk for impaired skin integrity will decrease Outcome: Progressing   Problem: Coping: Goal: Level of anxiety will decrease Outcome: Progressing   Problem: Safety: Goal: Ability to remain free from injury will improve Outcome: Progressing

## 2023-08-02 NOTE — Evaluation (Signed)
 Physical Therapy Evaluation Patient Details Name: Kenneth [name] MRN: [id number] DOB: May 30, 1950 Today's Date: 08/02/2023  History of Present Illness  Kenneth [name] is a 73 y.o. male presents to the hospital for 2-3 history of gradually progressive weakness, was also not eating or drinking well for a few days. Past medical history of stroke, currently on DAPT, dyslipidemia, BPH, prostate cancer, insulin-dependent DM type II, hypertension, cervical stenosis, history of iron deficiency anemia, history of OSA does not wear CPAP, recent history of right ankle strain 2 weeks ago.  Clinical Impression  Pt presents with admitting diagnosis above. Pt today required Max A for all mobility however was unable to take sidesteps along EOB. PTA pt reports that he was using RW to ambulate short household distances and was active with HHPT and OT. Pt also reports a recent ankle sprain 2 weeks ago. Patient will benefit from continued inpatient follow up therapy, <3 hours/day. PT will continue to follow.         If plan is discharge home, recommend the following: Two people to help with walking and/or transfers;A lot of help with bathing/dressing/bathroom;Assistance with cooking/housework;Direct supervision/assist for medications management;Assist for transportation;Help with stairs or ramp for entrance   Can travel by private vehicle   No    Equipment Recommendations Hoyer lift;Hospital bed;Wheelchair cushion (measurements PT);Wheelchair (measurements PT);BSC/3in1  Recommendations for Other Services       Functional Status Assessment Patient has had a recent decline in their functional status and demonstrates the ability to make significant improvements in function in a reasonable and predictable amount of time.     Precautions / Restrictions Precautions Precautions: Fall Restrictions Weight Bearing Restrictions Per Provider Order: No      Mobility  Bed Mobility Overal bed mobility: Needs  Assistance Bed Mobility: Supine to Sit, Sit to Supine     Supine to sit: Max assist Sit to supine: Max assist   General bed mobility comments: Max A via helicopter method.    Transfers Overall transfer level: Needs assistance Equipment used: Rolling walker (2 wheels) Transfers: Sit to/from Stand Sit to Stand: Max assist           General transfer comment: Attempted 3 times before being able to stand. Cues for hand placement. Pt was unable to take sidesteps.    Ambulation/Gait               General Gait Details: deferred  Stairs            Wheelchair Mobility     Tilt Bed    Modified Rankin (Stroke Patients Only)       Balance Overall balance assessment: Needs assistance Sitting-balance support: Bilateral upper extremity supported, Feet supported Sitting balance-Leahy Scale: Poor   Postural control: Posterior lean Standing balance support: Bilateral upper extremity supported, During functional activity Standing balance-Leahy Scale: Poor Standing balance comment: Reliant on RW                             Pertinent Vitals/Pain Pain Assessment Pain Assessment: No/denies pain    Home Living Family/patient expects to be discharged to:: Private residence Living Arrangements: Spouse/significant other;Other (Comment) (24 hour caregiver) Available Help at Discharge: Family;Available PRN/intermittently;Personal care attendant;Available 24 hours/day (24 hour care for his wife) Type of Home: House Home Access: Stairs to enter Entrance Stairs-Rails: Right Entrance Stairs-Number of Steps: 2 (very small)   Home Layout: Able to live on main level with bedroom/bathroom;Multi-level Home Equipment: BSC/3in1;Shower  seat - built [employer] fundraiser (2 wheels);Transport chair;Cane - single point Additional Comments: Pt reports recent ankle sprain 2 weeks ago    Prior Function Prior Level of Function : Needs assist             Mobility Comments:  Pt reports using a RW since ankle sprain only ambulating household distances. Active with HHPT and HHOT ADLs Comments: PCA helps with ADLs     Extremity/Trunk Assessment   Upper Extremity Assessment Upper Extremity Assessment: Generalized weakness    Lower Extremity Assessment Lower Extremity Assessment: Generalized weakness    Cervical / Trunk Assessment Cervical / Trunk Assessment: Kyphotic  Communication   Communication Communication: Impaired Factors Affecting Communication: Hearing impaired    Cognition Arousal: Alert Behavior During Therapy: WFL for tasks assessed/performed   PT - Cognitive impairments: No apparent impairments                         Following commands: Intact       Cueing Cueing Techniques: Verbal cues, Tactile cues     General Comments General comments (skin integrity, edema, etc.): VSS    Exercises     Assessment/Plan    PT Assessment Patient needs continued PT services  PT Problem List Decreased strength;Decreased range of motion;Decreased balance;Decreased activity tolerance;Decreased mobility;Decreased coordination;Decreased knowledge of use of DME;Decreased safety awareness;Cardiopulmonary status limiting activity;Decreased knowledge of precautions       PT Treatment Interventions DME instruction;Gait training;Stair training;Functional mobility training;Therapeutic activities;Therapeutic exercise;Balance training;Neuromuscular re-education;Cognitive remediation;Patient/family education    PT Goals (Current goals can be found in the Care Plan section)  Acute Rehab PT Goals Patient Stated Goal: to get stronger PT Goal Formulation: With patient Time For Goal Achievement: 08/16/23 Potential to Achieve Goals: Poor    Frequency Min 2X/week     Co-evaluation               AM-PAC PT "6 Clicks" Mobility  Outcome Measure Help needed turning from your back to your side while in a flat bed without using bedrails?: A  Lot Help needed moving from lying on your back to sitting on the side of a flat bed without using bedrails?: A Lot Help needed moving to and from a bed to a chair (including a wheelchair)?: Total Help needed standing up from a chair using your arms (e.g., wheelchair or bedside chair)?: A Lot Help needed to walk in hospital room?: Total Help needed climbing 3-5 steps with a railing? : Total 6 Click Score: 9    End of Session Equipment Utilized During Treatment: Gait belt Activity Tolerance: Patient limited by fatigue Patient left: in bed;with call bell/phone within reach;with nursing/sitter in room Nurse Communication: Mobility status PT Visit Diagnosis: Other abnormalities of gait and mobility (R26.89)    Time: 6045-4098 PT Time Calculation (min) (ACUTE ONLY): 29 min   Charges:   PT Evaluation $PT Eval Moderate Complexity: 1 Mod PT Treatments $Therapeutic Activity: 8-22 mins PT General Charges $$ ACUTE PT VISIT: 1 Visit         Shela Nevin, PT, DPT Acute Rehab Services [phone]   [name] Damme 08/02/2023, 12:02 PM

## 2023-08-02 NOTE — Progress Notes (Signed)
 PROGRESS NOTE                                                                                                                                                                                                             Patient Demographics:    Kenneth [name], is a 73 y.o. male, DOB - June 09, 1950, MVH:[id number]  Outpatient Primary MD for the patient is [name], MD    LOS - 1  Admit date - 08/01/2023    Chief Complaint  Patient presents with   Weakness       Brief Narrative (HPI from H&P)   73 y.o. male, with history of stroke, currently on DAPT, dyslipidemia, BPH, prostate cancer, insulin-dependent DM type II, hypertension, cervical stenosis, history of iron deficiency anemia, history of OSA does not wear CPAP, recent history of right ankle strain 2 weeks ago, presents to the hospital for 2-3 history of gradually progressive weakness, was also not eating or drinking well for a few days.  For the last couple of days he has been having problems getting out of the bed and ambulating as he is extremely weak, in the ER he was found to be hypotensive, had hypomagnesemia, was also found to have new onset anemia and I was called to admit the patient.    Subjective:    Kenneth [name] today has, No headache, No chest pain, No abdominal pain - No Nausea, No new weakness tingling or numbness, no shortness of breath, generalized weakness has improved.   Assessment  & Plan :    1.  Generalized weakness in a patient with gradually progressive anemia of chronic disease, dehydration, hypomagnesemia.   Patient has had chronic anemia for several years however there is at least a 3 g drop in his hemoglobin in the last few months, of note he has been started on DAPT for his recent stroke around that timeframe, he denies any hematemesis, no dark stools or blood in stool.  He likely has intermittent chronic GI blood loss, Hemoccult negative in  the ER, place him on PPI, for now continue DAPT, GI has been consulted, EGD likely on 08/02/2023.    He is agreeable for transfusions if needed in the future, will get IV fluids for dehydration along with PT OT.  UA stable, anemia panel suggestive of mild iron deficiency and possible B12  deficiency, stable TSH.     2.  Hypomagnesemia and dehydration.  Hydrated with IV fluids, magnesium replaced stable.   3.  Recent stroke few months ago.  Continue DAPT for now, continue statin and Zetia.  No focal deficits but does have significant generalized weakness, PT OT may require placement.     4.  Dyslipidemia.  Continue statin and Zetia.   5.  BPH.  Continue Flomax.  UA stable no UTI.   6.  Depression.  On Prozac and Lexapro combination continue, no acute issues.   7.  OSA.  Does not use CPAP.     8.  Essential hypertension.  Currently blood pressure low, dehydrated, hold blood pressure medications hydrate and monitor.     9.  History of cervical stenosis, recent right ankle strain.  Supportive care.     10.  B12 deficiency.  Replaced.    11.  Hyponatremia.  Check urine electrolytes and serum osmolality.    12. DM type II.  On high doses of insulin at home, continue with q. [facility] S sliding scale.  Every 2 Accu-Cheks while n.p.o. on 08/02/2023.   CBG (last 3)  Recent Labs    08/01/23 2111 08/02/23 0807  GLUCAP 115* 124*    Lab Results  Component Value Date   HGBA1C 5.3 08/01/2023         Condition - Guarded  Family Communication  : Wife on the day of admission 08/01/2023 bedside  Code Status : Full code  Consults  : GI  PUD Prophylaxis : PPI   Procedures  :            Disposition Plan  :    Status is: Inpatient   DVT Prophylaxis  :    SCDs Start: 08/01/23 1239 Place TED hose Start: 08/01/23 1233    Lab Results  Component Value Date   PLT 107 (L) 08/02/2023    Diet :  Diet Order             Diet NPO time specified  Diet effective midnight                     Inpatient Medications  Scheduled Meds:  [MAR Hold] albuterol  2.5 mg Nebulization Q6H   [MAR Hold] aspirin  81 mg Oral Daily   [MAR Hold] atorvastatin  40 mg Oral QHS   [MAR Hold] cyanocobalamin  1,000 mcg Subcutaneous Daily   [MAR Hold] vitamin B-12  1,000 mcg Oral Daily   [MAR Hold] escitalopram  20 mg Oral Daily   [MAR Hold] ezetimibe  10 mg Oral Daily   [MAR Hold] ferrous sulfate  325 mg Oral BID WC   [MAR Hold] FLUoxetine  10 mg Oral Daily   [MAR Hold] folic acid  1 mg Oral Daily   [MAR Hold] insulin aspart  0-15 Units Subcutaneous TID WC   [MAR Hold] insulin aspart  0-5 Units Subcutaneous QHS   [MAR Hold] insulin glargine  30 Units Subcutaneous Daily   [MAR Hold] pantoprazole  40 mg Oral BID AC   [MAR Hold] tamsulosin  0.4 mg Oral QPM   [MAR Hold] ticagrelor  90 mg Oral BID   Continuous Infusions:  sodium chloride 500 mL (08/02/23 0945)   lactated ringers Stopped (08/02/23 0910)   PRN Meds:.sodium chloride, [MAR Hold] acetaminophen **OR** [MAR Hold] acetaminophen, [MAR Hold] bisacodyl, [MAR Hold] ondansetron **OR** [MAR Hold] ondansetron (ZOFRAN) IV  Antibiotics  :    Anti-infectives (From admission,  onward)    None         Objective:   Vitals:   08/02/23 0809 08/02/23 0810 08/02/23 0811 08/02/23 0939  BP:   117/75 133/66  Pulse: 72 66 70 73  Resp: (!) 23 (!) 24 (!) 25 19  Temp:   97.6 F (36.4 C) 97.6 F (36.4 C)  TempSrc:   Oral Temporal  SpO2: 94% 92% 95% 95%  Weight:      Height:        Wt Readings from Last 3 Encounters:  08/01/23 86.6 kg  05/21/23 88.7 kg  05/01/23 89.3 kg     Intake/Output Summary (Last 24 hours) at 08/02/2023 0946 Last data filed at 08/02/2023 0910 Gross per 24 hour  Intake 619.6 ml  Output 400 ml  Net 219.6 ml     Physical Exam  Awake Alert, No new F.N deficits, Normal affect [place].AT,PERRAL Supple Neck, No JVD,   Symmetrical Chest wall movement, Good air movement bilaterally, CTAB RRR,No Gallops,Rubs  or new Murmurs,  +ve B.Sounds, Abd Soft, No tenderness,   No Cyanosis, Clubbing or edema        Data Review:    Recent Labs  Lab 08/01/23 0630 08/01/23 1848 08/02/23 0549  WBC 7.9 7.9 7.8  HGB 9.0* 8.7* 8.7*  HCT 26.8* 24.9* 25.0*  PLT 87* 99* 107*  MCV 89.9 87.1 85.3  MCH 30.2 30.4 29.7  MCHC 33.6 34.9 34.8  RDW 14.6 14.6 14.8  LYMPHSABS 1.0  --  0.2*  MONOABS 1.9*  --  0.5  EOSABS 0.1  --  0.0  BASOSABS 0.0  --  0.0    Recent Labs  Lab 08/01/23 0630 08/01/23 0913 08/01/23 1310 08/02/23 0549  NA 132*  --   --  131*  K 4.1  --   --  3.7  CL 101  --   --  100  CO2 21*  --   --  22  ANIONGAP 10  --   --  9  GLUCOSE 168*  --   --  143*  BUN 17  --   --  12  CREATININE 0.87  --   --  0.81  AST 26  --   --  19  ALT 15  --   --  17  ALKPHOS 43  --   --  43  BILITOT 1.9*  --   --  1.4*  ALBUMIN 2.6*  --   --  2.5*  TSH  --   --  1.499  --   HGBA1C 5.3  --   --   --   BNP  --  33.0  --  72.3  MG 1.4*  --   --  1.7  PHOS  --   --   --  3.6  CALCIUM 8.1*  --   --  8.3*      Recent Labs  Lab 08/01/23 0630 08/01/23 0913 08/01/23 1310 08/02/23 0549  TSH  --   --  1.499  --   HGBA1C 5.3  --   --   --   BNP  --  33.0  --  72.3  MG 1.4*  --   --  1.7  CALCIUM 8.1*  --   --  8.3*    --------------------------------------------------------------------------------------------------------------- Lab Results  Component Value Date   CHOL 79 04/07/2023   HDL 17 (L) 04/07/2023   LDLCALC 47 04/07/2023   TRIG 75 04/07/2023   CHOLHDL 4.6 04/07/2023  Lab Results  Component Value Date   HGBA1C 5.3 08/01/2023   Recent Labs    08/01/23 1310  TSH 1.499  FREET4 1.06   Recent Labs    08/01/23 1310  VITAMINB12 142*  FOLATE 14.9  FERRITIN 577*  TIBC 217*  IRON 36*  RETICCTPCT 3.0   ------------------------------------------------------------------------------------------------------------------ Cardiac Enzymes No results for input(s): "CKMB",  "TROPONINI", "MYOGLOBIN" in the last 168 hours.  Invalid input(s): "CK"  Micro Results Recent Results (from the past 240 hours)  Resp panel by RT-PCR (RSV, Flu A&B, Covid) Anterior Nasal Swab     Status: None   Collection Time: 08/01/23  7:13 AM   Specimen: Anterior Nasal Swab  Result Value Ref Range Status   SARS Coronavirus 2 by RT PCR NEGATIVE NEGATIVE Final   Influenza A by PCR NEGATIVE NEGATIVE Final   Influenza B by PCR NEGATIVE NEGATIVE Final    Comment: (NOTE) The Xpert Xpress SARS-CoV-2/FLU/RSV plus assay is intended as an aid in the diagnosis of influenza from Nasopharyngeal swab specimens and should not be used as a sole basis for treatment. Nasal washings and aspirates are unacceptable for Xpert Xpress SARS-CoV-2/FLU/RSV testing.  Fact Sheet for Patients: BloggerCourse.com  Fact Sheet for Healthcare Providers: SeriousBroker.it  This test is not yet approved or cleared by the [place] FDA and has been authorized for detection and/or diagnosis of SARS-CoV-2 by FDA under an Emergency Use Authorization (EUA). This EUA will remain in effect (meaning this test can be used) for the duration of the COVID-19 declaration under Section 564(b)(1) of the Act, 21 U.S.C. section 360bbb-3(b)(1), unless the authorization is terminated or revoked.     Resp Syncytial Virus by PCR NEGATIVE NEGATIVE Final    Comment: (NOTE) Fact Sheet for Patients: BloggerCourse.com  Fact Sheet for Healthcare Providers: SeriousBroker.it  This test is not yet approved or cleared by the [place] FDA and has been authorized for detection and/or diagnosis of SARS-CoV-2 by FDA under an Emergency Use Authorization (EUA). This EUA will remain in effect (meaning this test can be used) for the duration of the COVID-19 declaration under Section 564(b)(1) of the Act, 21 U.S.C. section  360bbb-3(b)(1), unless the authorization is terminated or revoked.  Performed at [facility], [address], [place], [place] [address]     Radiology Report DG Chest Port 1 View Result Date: 08/01/2023 CLINICAL DATA:  Generalized weakness, onset 8 p.m. last night. EXAM: PORTABLE CHEST 1 VIEW COMPARISON:  AP Lat chest 04/11/2023 FINDINGS: There is chronic thickening of lower lateral right pleura, associated chronic pleuroparenchymal disease in the lateral right base. The lungs are otherwise clear. A loop recorder device is again noted on the left. The cardiac size is normal. There is mild aortic tortuosity and atherosclerosis with a stable mediastinum and no vascular congestion. No new osseous findings. Dorsal and ventral cervical fusion hardware is partially visible. Multilevel thoracic spine bridging enthesopathy. IMPRESSION: 1. No evidence of acute chest disease. 2. 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 3. Aortic atherosclerosis. Electronically Signed   By: Almira Bar M.D.   On: 08/01/2023 07:44     Signature  -   Susa Raring M.D on 08/02/2023 at 9:46 AM   -  To page go to [email or web address]

## 2023-08-02 NOTE — Anesthesia Postprocedure Evaluation (Signed)
 Anesthesia Post Note  Patient: Kenneth [name]  Procedure(s) Performed: EGD (ESOPHAGOGASTRODUODENOSCOPY)     Patient location during evaluation: PACU Anesthesia Type: MAC Level of consciousness: awake and alert Pain management: pain level controlled Vital Signs Assessment: post-procedure vital signs reviewed and stable Respiratory status: spontaneous breathing, nonlabored ventilation, respiratory function stable and patient connected to nasal cannula oxygen Cardiovascular status: stable and blood pressure returned to baseline Postop Assessment: no apparent nausea or vomiting Anesthetic complications: no  No notable events documented.  Last Vitals:  Vitals:   08/02/23 1210 08/02/23 1314  BP: 117/60   Pulse: 62   Resp: (!) 21   Temp: (!) 36.4 C   SpO2: 94% 100%    Last Pain:  Vitals:   08/02/23 1210  TempSrc: Oral  PainSc:                  Shelton Silvas

## 2023-08-02 NOTE — Progress Notes (Signed)
 PT Cancellation Note  Patient Details Name: Kenneth [name] MRN: [id number] DOB: [birth year]   Cancelled Treatment:    Reason Eval/Treat Not Completed: Patient at procedure or test/unavailable (Pt off the floor at endoscopy. Will follow up later if time allows.)   [name] Damme 08/02/2023, 9:39 AM

## 2023-08-02 NOTE — Op Note (Signed)
 [facility] Patient Name: Kenneth [name] Procedure Date : 08/02/2023 MRN: [id number] Attending MD: Kenneth [name] , MD, [id number] Date of Birth: [birth year] CSN: [id number] Age: 73 Admit Type: Inpatient Procedure:                Upper GI endoscopy Indications:              Acute anemia without overt GI bleeding, negative                            FOBT. Providers:                Kenneth Amis. Tomasa Rand, MD, Martha Clan, RN,                            Priscella Mann, Technician Referring MD:              Medicines:                Monitored Anesthesia Care Complications:            No immediate complications. Estimated Blood Loss:     Estimated blood loss was minimal. Procedure:                Pre-Anesthesia Assessment:                           - Prior to the procedure, a History and Physical                            was performed, and patient medications and                            allergies were reviewed. The patient's tolerance of                            previous anesthesia was also reviewed. The risks                            and benefits of the procedure and the sedation                            options and risks were discussed with the patient.                            All questions were answered, and informed consent                            was obtained. Prior Anticoagulants: The patient has                            taken Brilinta (ticagrelor), last dose was 1 day                            prior to procedure. ASA Grade Assessment: III - A  patient with severe systemic disease. After                            reviewing the risks and benefits, the patient was                            deemed in satisfactory condition to undergo the                            procedure.                           After obtaining informed consent, the endoscope was                            passed under direct vision. Throughout the                             procedure, the patient's blood pressure, pulse, and                            oxygen saturations were monitored continuously. The                            [id number]-[id number] ([id number]) Olympus endoscope was introduced                            through the mouth, and advanced to the second part                            of duodenum. The upper GI endoscopy was                            accomplished without difficulty. The patient                            tolerated the procedure well. Scope In: Scope Out: Findings:      The examined portions of the nasopharynx, oropharynx and larynx were       normal.      The examined esophagus was normal.      A 3 cm hiatal hernia was present. The gastric cardia was noted to       prolapse into the esophagus on insertion. On withdrawal, a linear       mucosal irritation was seen in the hernia sac, likely secondary to scope       irritation. As this was not seen on insertion, doubt Cameron's ulcer.      The exam of the stomach was otherwise normal.      Scattered minimal inflammation characterized by erythema and nodularity       was found in the gastric body and in the gastric antrum. Biopsies were       taken with a cold forceps for Helicobacter pylori testing. Estimated       blood loss was minimal.      Diffuse moderate inflammation characterized by nodularity was found in  the duodenal bulb.      The examined duodenum was otherwise normal. Impression:               - The examined portions of the nasopharynx,                            oropharynx and larynx were normal.                           - Normal esophagus.                           - 3 cm hiatal hernia with prolapsing cardia.                           - Gastritis. Biopsied.                           - Normal examined duodenum.                           - No obvious source of GI bleeding. Doubt mild                            gastritis would have been a source  of significant                            bleed. Moderate Sedation:      N/A Recommendation:           - Patient has a contact number available for                            emergencies. The signs and symptoms of potential                            delayed complications were discussed with the                            patient. Return to normal activities tomorrow.                            Written discharge instructions were provided to the                            patient.                           - Resume previous diet.                           - Continue present medications including                            Brillinta/ASA.                           - Await pathology results.                           -  Consider non-GI sources of anemia, given absence                            of overt bleeding, negative FOBT and concomitant                            thrombocytopenia.                           - Colonoscopy could be considered for further                            evaluation, but likely low yield, given his recent                            colonoscopy in 2021.                           - GI will sign off for now. If no other possible                            etiologies for anemia identified, or if patient has                            overt GI bleeding, please reconsult GI. Procedure Code(s):        --- Professional ---                           [phone], Esophagogastroduodenoscopy, flexible,                            transoral; with biopsy, single or multiple Diagnosis Code(s):        --- Professional ---                           K44.9, Diaphragmatic hernia without obstruction or                            gangrene                           K29.70, Gastritis, unspecified, without bleeding                           D62, Acute posthemorrhagic anemia CPT copyright 2022 American Medical Association. All rights reserved. The codes documented in this report are  preliminary and upon coder review may  be revised to meet current compliance requirements. [name], MD 08/02/2023 10:56:38 AM This report has been signed electronically. Number of Addenda: 0

## 2023-08-02 NOTE — Interval H&P Note (Signed)
 History and Physical Interval Note:  08/02/2023 9:45 AM  Kenneth [name]  has presented today for surgery, with the diagnosis of anemia.  The various methods of treatment have been discussed with the patient and family. After consideration of risks, benefits and other options for treatment, the patient has consented to  Procedure(s): EGD (ESOPHAGOGASTRODUODENOSCOPY) (N/A) as a surgical intervention.  The patient's history has been reviewed, patient examined, no change in status, stable for surgery.  I have reviewed the patient's chart and labs.  Questions were answered to the patient's satisfaction.     [name]

## 2023-08-02 NOTE — Transfer of Care (Signed)
 Immediate Anesthesia Transfer of Care Note  Patient: Kenneth [name]  Procedure(s) Performed: EGD (ESOPHAGOGASTRODUODENOSCOPY)  Patient Location: Endoscopy Unit  Anesthesia Type:MAC  Level of Consciousness: drowsy  Airway & Oxygen Therapy: Patient Spontanous Breathing and Patient connected to face mask oxygen  Post-op Assessment: Report given to RN and Post -op Vital signs reviewed and stable  Post vital signs: Reviewed and stable  Last Vitals:  Vitals Value Taken Time  BP 91/63 1033  Temp    Pulse 77   Resp 18   SpO2 99%     Last Pain:  Vitals:   08/02/23 0939  TempSrc: Temporal  PainSc: 0-No pain         Complications: No notable events documented.

## 2023-08-02 NOTE — Anesthesia Preprocedure Evaluation (Signed)
 Anesthesia Evaluation  Patient identified by MRN, date of birth, ID band Patient awake    Reviewed: Allergy & Precautions, NPO status , Patient's Chart, lab work & pertinent test results  Airway Mallampati: I  TM Distance: >3 FB Neck ROM: Full    Dental  (+) Teeth Intact, Dental Advisory Given   Pulmonary sleep apnea , former smoker   breath sounds clear to auscultation       Cardiovascular hypertension, Pt. on medications +CHF   Rhythm:Regular Rate:Normal     Neuro/Psych CVA, Residual Symptoms    GI/Hepatic negative GI ROS, Neg liver ROS,,,  Endo/Other  diabetes, Type 2, Oral Hypoglycemic Agents    Renal/GU negative Renal ROS     Musculoskeletal negative musculoskeletal ROS (+)    Abdominal   Peds  Hematology  (+) Blood dyscrasia, anemia   Anesthesia Other Findings   Reproductive/Obstetrics                             Anesthesia Physical Anesthesia Plan  ASA: 2  Anesthesia Plan: MAC   Post-op Pain Management: Minimal or no pain anticipated   Induction: Intravenous  PONV Risk Score and Plan: 0 and Propofol infusion  Airway Management Planned: Natural Airway and Nasal Cannula  Additional Equipment: None  Intra-op Plan:   Post-operative Plan:   Informed Consent: I have reviewed the patients History and Physical, chart, labs and discussed the procedure including the risks, benefits and alternatives for the proposed anesthesia with the patient or authorized representative who has indicated his/her understanding and acceptance.       Plan Discussed with: CRNA  Anesthesia Plan Comments:        Anesthesia Quick Evaluation

## 2023-08-02 NOTE — Progress Notes (Signed)
 [facility] night cross cover note:   I was notified by RN and the patient, after undergoing EGD earlier today, is reporting some odynophagia in the absence of dysphagia with taking her evening pills.   Per my review of the operative report from today's EGD esophagus was of normal appearance, there was no report of any associated complication with this procedure.  I subsequently added prn chloraseptic spray and prn cepacol throat lozenges for painful swallowing/sore throat.   Newton Pigg, DO Hospitalist

## 2023-08-03 ENCOUNTER — Encounter (HOSPITAL_COMMUNITY): Payer: Self-pay | Admitting: Gastroenterology

## 2023-08-03 DIAGNOSIS — D649 Anemia, unspecified: Secondary | ICD-10-CM | POA: Diagnosis not present

## 2023-08-03 LAB — CBC WITH DIFFERENTIAL/PLATELET
Abs Immature Granulocytes: 0.04 10*3/uL (ref 0.00–0.07)
Abs Immature Granulocytes: 0.06 10*3/uL (ref 0.00–0.07)
Basophils Absolute: 0 10*3/uL (ref 0.0–0.1)
Basophils Absolute: 0 10*3/uL (ref 0.0–0.1)
Basophils Relative: 1 %
Basophils Relative: 0 %
Eosinophils Absolute: 0 10*3/uL (ref 0.0–0.5)
Eosinophils Absolute: 0 10*3/uL (ref 0.0–0.5)
Eosinophils Relative: 1 %
Eosinophils Relative: 0 %
HCT: 24.9 % — ABNORMAL LOW (ref 39.0–52.0)
HCT: 27.3 % — ABNORMAL LOW (ref 39.0–52.0)
Hemoglobin: 8.8 g/dL — ABNORMAL LOW (ref 13.0–17.0)
Hemoglobin: 9.6 g/dL — ABNORMAL LOW (ref 13.0–17.0)
Immature Granulocytes: 1 %
Immature Granulocytes: 1 %
Lymphocytes Relative: 14 %
Lymphocytes Relative: 12 %
Lymphs Abs: 1 10*3/uL (ref 0.7–4.0)
Lymphs Abs: 1 10*3/uL (ref 0.7–4.0)
MCH: 30.2 pg (ref 26.0–34.0)
MCH: 30.5 pg (ref 26.0–34.0)
MCHC: 35.3 g/dL (ref 30.0–36.0)
MCHC: 35.2 g/dL (ref 30.0–36.0)
MCV: 85.6 fL (ref 80.0–100.0)
MCV: 86.7 fL (ref 80.0–100.0)
Monocytes Absolute: 2 10*3/uL — ABNORMAL HIGH (ref 0.1–1.0)
Monocytes Absolute: 1.9 10*3/uL — ABNORMAL HIGH (ref 0.1–1.0)
Monocytes Relative: 27 %
Monocytes Relative: 22 %
Neutro Abs: 4.4 10*3/uL (ref 1.7–7.7)
Neutro Abs: 5.8 10*3/uL (ref 1.7–7.7)
Neutrophils Relative %: 56 %
Neutrophils Relative %: 65 %
Platelets: 108 10*3/uL — ABNORMAL LOW (ref 150–400)
Platelets: 101 10*3/uL — ABNORMAL LOW (ref 150–400)
RBC: 2.91 MIL/uL — ABNORMAL LOW (ref 4.22–5.81)
RBC: 3.15 MIL/uL — ABNORMAL LOW (ref 4.22–5.81)
RDW: 14.8 % (ref 11.5–15.5)
RDW: 15.2 % (ref 11.5–15.5)
WBC: 7.5 10*3/uL (ref 4.0–10.5)
WBC: 8.9 10*3/uL (ref 4.0–10.5)
nRBC: 0 % (ref 0.0–0.2)
nRBC: 0 % (ref 0.0–0.2)

## 2023-08-03 LAB — GLUCOSE, CAPILLARY
Glucose-Capillary: 140 mg/dL — ABNORMAL HIGH (ref 70–99)
Glucose-Capillary: 138 mg/dL — ABNORMAL HIGH (ref 70–99)
Glucose-Capillary: 193 mg/dL — ABNORMAL HIGH (ref 70–99)
Glucose-Capillary: 165 mg/dL — ABNORMAL HIGH (ref 70–99)

## 2023-08-03 LAB — COMPREHENSIVE METABOLIC PANEL
ALT: 22 U/L (ref 0–44)
AST: 25 U/L (ref 15–41)
Albumin: 2.4 g/dL — ABNORMAL LOW (ref 3.5–5.0)
Alkaline Phosphatase: 45 U/L (ref 38–126)
Anion gap: 5 (ref 5–15)
BUN: 17 mg/dL (ref 8–23)
CO2: 24 mmol/L (ref 22–32)
Calcium: 7.8 mg/dL — ABNORMAL LOW (ref 8.9–10.3)
Chloride: 102 mmol/L (ref 98–111)
Creatinine, Ser: 0.87 mg/dL (ref 0.61–1.24)
GFR, Estimated: >60 mL/min (ref >60)
Glucose, Bld: 146 mg/dL — ABNORMAL HIGH (ref 70–99)
Potassium: 3.7 mmol/L (ref 3.5–5.1)
Sodium: 131 mmol/L — ABNORMAL LOW (ref 135–145)
Total Bilirubin: 1.3 mg/dL — ABNORMAL HIGH (ref 0.0–1.2)
Total Protein: 4.8 g/dL — ABNORMAL LOW (ref 6.5–8.1)

## 2023-08-03 LAB — BRAIN NATRIURETIC PEPTIDE: B Natriuretic Peptide: 53.6 pg/mL (ref 0.0–100.0)

## 2023-08-03 LAB — OSMOLALITY: Osmolality: 276 mosm/kg (ref 275–295)

## 2023-08-03 LAB — MAGNESIUM: Magnesium: 1.6 mg/dL — ABNORMAL LOW (ref 1.7–2.4)

## 2023-08-03 LAB — PHOSPHORUS: Phosphorus: 3.6 mg/dL (ref 2.5–4.6)

## 2023-08-03 MED ORDER — INSULIN GLARGINE-YFGN 100 UNIT/ML ~~LOC~~ SOLN
30.0000 [IU] | Freq: Every day | SUBCUTANEOUS | Status: DC
  Administered 2023-08-03 – 2023-08-09 (×5): 30 [IU] via SUBCUTANEOUS
  Filled 2023-08-03 (×10): qty 0.3

## 2023-08-03 MED ORDER — MAGNESIUM SULFATE 4 GM/100ML IV SOLN
4.0000 g | Freq: Once | INTRAVENOUS | Status: AC
  Administered 2023-08-03: 4 g via INTRAVENOUS
  Filled 2023-08-03: qty 100

## 2023-08-03 NOTE — Progress Notes (Signed)
   08/03/23 1117  Vitals  Temp (!) 97.4 F (36.3 C)  Temp Source Axillary  BP (!) 113/41  MAP (mmHg) (!) 63  BP Location Left Arm  BP Method Automatic  Patient Position (if appropriate) Lying  Pulse Rate 64  ECG Heart Rate 64  Resp (!) 29  MEWS COLOR  MEWS Score Color Yellow  Oxygen Therapy  SpO2 93 %  O2 Device Room Air  MEWS Score  MEWS Temp 0  MEWS Systolic 0  MEWS Pulse 0  MEWS RR 2  MEWS LOC 0  MEWS Score 2

## 2023-08-03 NOTE — Evaluation (Signed)
 Occupational Therapy Evaluation Patient Details Name: Kenneth [name] MRN: [id number] DOB: 03-26-1951 Today's Date: 08/03/2023   History of Present Illness   Kenneth [name] is a 73 y.o. male presents to the hospital for 2-3 history of gradually progressive weakness, was also not eating or drinking well for a few days. Past medical history of stroke, currently on DAPT, dyslipidemia, BPH, prostate cancer, insulin-dependent DM type II, hypertension, cervical stenosis, history of iron deficiency anemia, history of OSA does not wear CPAP, recent history of right ankle strain 2 weeks ago. CT - negative MRI - small acute to subacute infarcts L frontal and parietal lobes and R temporal white matter     Clinical Impressions Prior to admission patient was receiving some assistance with adls by a PCA and ambulating short distances after a recent ankle sprain.  State he attended [facility], which is where he twisted his ankle.  Currently patient is Max A at the bed level for adls.  I was unable to get her into standing due to fatigue and unable to weight bear on the RLE.  Depending on support at home, may be able to return home with [facility]: Lambert Rhonda W, otherwise patient will benefit from continued inpatient follow up therapy, <3 hours/day  Will continue on acute for facilitate discharge      If plan is discharge home, recommend the following:   A lot of help with walking and/or transfers;A little help with bathing/dressing/bathroom;Assistance with cooking/housework;Direct supervision/assist for medications management;Assist for transportation;Help with stairs or ramp for entrance     Functional Status Assessment   Patient has had a recent decline in their functional status and demonstrates the ability to make significant improvements in function in a reasonable and predictable amount of time.     Equipment Recommendations   None recommended by OT     Recommendations for Other Services          Precautions/Restrictions   Precautions Precautions: Fall Precaution/Restrictions Comments: R ankle/ RLE swollen Required Braces or Orthoses: Other Brace (R ankle is wrapped) Restrictions Weight Bearing Restrictions Per Provider Order: No     Mobility Bed Mobility Overal bed mobility: Needs Assistance Bed Mobility: Supine to Sit, Sit to Supine     Supine to sit: Max assist Sit to supine: Max assist        Transfers Overall transfer level: Needs assistance                 General transfer comment: Unable to get paitne into standing      Balance                                           ADL either performed or assessed with clinical judgement   ADL Overall ADL's : Needs assistance/impaired Eating/Feeding: Set up   Grooming: Wash/dry face;Set up   Upper Body Bathing: Moderate assistance   Lower Body Bathing: Maximal assistance   Upper Body Dressing : Moderate assistance   Lower Body Dressing: Maximal assistance               Functional mobility during ADLs: +2 for physical assistance;Moderate assistance (Patient was unable to bear weight on the RLE today secondary to pain) General ADL Comments: Adls are from bed level     Vision Baseline Vision/History: 1 Wears glasses Ability to See in Adequate Light: 0 Adequate Patient Visual Report: No change from baseline Additional  Comments: may need more assessment     Perception         Praxis         Pertinent Vitals/Pain Pain Assessment Pain Assessment: Faces Faces Pain Scale: Hurts even more Pain Location: R ankle.  Complained of other pain, but couldn't elaborate Pain Descriptors / Indicators: Discomfort, Grimacing, Moaning Pain Intervention(s): Limited activity within patient's tolerance     Extremity/Trunk Assessment Upper Extremity Assessment Upper Extremity Assessment: Generalized weakness   Lower Extremity Assessment Lower Extremity Assessment: Defer to PT  evaluation   Cervical / Trunk Assessment Cervical / Trunk Assessment: Kyphotic   Communication Communication Communication: Impaired Factors Affecting Communication: Hearing impaired   Cognition Arousal: Alert Behavior During Therapy: Flat affect, Anxious               OT - Cognition Comments: Needs further assesment                 Following commands: Impaired Following commands impaired: Follows multi-step commands with increased time     Cueing  General Comments   Cueing Techniques: Verbal cues;Tactile cues;Gestural cues  Swelling R LE   Exercises     Shoulder Instructions      Home Living Family/patient expects to be discharged to:: Private residence Living Arrangements: Spouse/significant other;Other (Comment) Available Help at Discharge: Family;Available PRN/intermittently;Personal care attendant;Available 24 hours/day Type of Home: House Home Access: Stairs to enter [employer] of Steps: 2 (very small) Entrance Stairs-Rails: Right Home Layout: Able to live on main level with bedroom/bathroom;Multi-level     Bathroom Shower/Tub: Producer, television/film/video: Standard Bathroom Accessibility: Yes How Accessible: Accessible via walker Home Equipment: BSC/3in1;Shower seat - built in;Rolling Walker (2 wheels);Transport chair;Cane - single point          Prior Functioning/Environment Prior Level of Function : Needs assist       Physical Assist : ADLs (physical)   ADLs (physical): Bathing;Dressing;IADLs Mobility Comments: Pt reports using a RW since ankle sprain only ambulating household distances. Active with HHPT and HHOT ADLs Comments: PCA helps with ADLs    OT Problem List: Decreased strength;Decreased activity tolerance;Impaired balance (sitting and/or standing);Pain   OT Treatment/Interventions: Self-care/ADL training;Therapeutic exercise;Therapeutic activities;Patient/family education;Balance training      OT  Goals(Current goals can be found in the care plan section)   Acute Rehab OT Goals Patient Stated Goal: Wants to get better OT Goal Formulation: With patient Time For Goal Achievement: 08/17/23 Potential to Achieve Goals: Good   OT Frequency:  Min 2X/week    Co-evaluation              AM-PAC OT "6 Clicks" Daily Activity     Outcome Measure Help from another person eating meals?: None Help from another person taking care of personal grooming?: A Little Help from another person toileting, which includes using toliet, bedpan, or urinal?: A Lot Help from another person bathing (including washing, rinsing, drying)?: A Lot Help from another person to put on and taking off regular upper body clothing?: A Lot Help from another person to put on and taking off regular lower body clothing?: A Lot 6 Click Score: 15   End of Session Nurse Communication: Mobility status (RLE swelling)  Activity Tolerance: Patient limited by pain;Patient limited by fatigue Patient left: in bed;with call bell/phone within reach  OT Visit Diagnosis: Unsteadiness on feet (R26.81);Muscle weakness (generalized) (M62.81);Pain Pain - Right/Left: Right Pain - part of body: Ankle and joints of foot  Time: 1610-9604 OT Time Calculation (min): 30 min Charges:  OT General Charges $OT Visit: 1 Visit OT Evaluation $OT Eval Moderate Complexity: 1 Mod OT Treatments $Self Care/Home Management : 23-37 mins  Hal Neer OTR/L   [name] 08/03/2023, 11:34 AM

## 2023-08-03 NOTE — TOC Initial Note (Signed)
 Transition of Care [facility]) - Initial/Assessment Note    Patient Details  Name: Kenneth [name] MRN: [id number] Date of Birth: Apr 28, 1951  Transition of Care [facility]) CM/SW Contact:    Mearl Latin, LCSW Phone Number: 08/03/2023, 11:40 AM  Clinical Narrative:                 CSW received consult for possible SNF placement at time of discharge. CSW spoke with patient. Patient reported that patient's spouse is currently unable to care for patient at their home given patient's current physical needs and fall risk. Patient expressed understanding of PT recommendation and is agreeable to SNF placement at time of discharge. Patient reports preference for [facility] (does not want [facility]). CSW discussed insurance authorization process and will provide Medicare SNF ratings list. CSW will send out referrals for review and provide bed offers as available. [facility] is reviewing.   Skilled Nursing Rehab Facilities-   [email or web address]   Ratings out of 5 stars (5 the highest)   Name Address  Phone # Quality Care Staffing Health Inspection Overall  [facility] [address] [phone] 3 3 4 4   [facility] [address] [place] [phone] 5 1 4 4   [facility] [address], [facility] [phone] 2 2 2 2   [facility] [address] [place] [phone] 5 2 4 5   [facility]  [address], [place] [phone] 4 3 5 5   [facility] [address], [facility] [phone] 4 2 2 2   [facility] [address], [place] [phone] 5 1 2 2   [facility] [address] [place] [phone] 2 1 3 2   [address] ([facility]) [address], [place] [phone] 2 3 3 3   [facility] [address] [facility], [place] [phone] 3 3 2 2   [facility]) [address], [place] [phone] 3 1 1 1   [facility] [address] [place] [phone] 2 3 4 4   [facility] [address], [place] [phone] 4 4 3 3   [address] ([facility]) [address], [place] [phone] 1 2 4 3           [facility] [address], [place] [phone] 2 1 4 3   [facility] [address], [place] [phone] 4 2 1 1   [facility]  [address] [place], [place] [address] [phone] 2 2 2 2   [facility] [address] [phone] 3 2 4 4   [facility] [address] [place] [phone] 2 1 2 1   [facility] (No [facility]) [address], [place] [phone] 5 4 5 5   [facility] [address] [facility] [phone] 3 4 2 2   [facility] [address], [place] [phone] 2 1 1 1   [facility] [address] [place] [phone] 4 2 5 5   [facility] [address] [place] [phone] 4 1 1 1   [facility] [address] [place], [place] [phone] 2 2 3 3           [facility]  [phone] 3 1 1 1   [facility] [address], [place] Clines  [phone] 3 3 3 3   [facility] (No [employer]) [address] [place] [phone] 2 2 4 4   [facility]) [address], [place] [phone] 2 1 1 1   [facility] [address], [place] [phone] 4 3 5 5   [facility] [place] [address], [place] [phone] 1 1 1 1           [facility] [address] [place], [place] [phone] 5 4 5 5   [facility])  [address], [place] [phone] 1 1 2 1   [facility]) [address] [place] [phone]  2 4 4   [facility] [address] [place] [phone] 3 5 5 5   [address] [place], [place] [phone] 4 2 2 2   [facility]) [address] [place] [phone] 2 1 3 2      Expected Discharge Plan: Skilled Nursing Facility Barriers to Discharge: Continued  Medical Work up, SNF Pending bed offer   Patient Goals and CMS Choice Patient states their goals for this hospitalization and ongoing recovery are:: Rehab CMS Medicare.gov Compare Post Acute Care list provided to:: Patient Choice offered to / list presented to : Patient [place] ownership interest in [facility].provided to:: Patient    Expected Discharge Plan and Services In-house Referral: Clinical Social Work   Post Acute Care Choice: Skilled Nursing Facility Living arrangements for the past 2 months: Single Family Home                                      Prior Living Arrangements/Services Living arrangements for the past 2 months: Single Family Home Lives with:: Spouse Patient language and need for interpreter reviewed:: Yes Do you feel safe going back to the place where you live?: Yes      Need for Family Participation in Patient Care: Yes (Comment) Care giver support system in place?: Yes (comment)   Criminal Activity/Legal Involvement Pertinent to Current Situation/Hospitalization: No - Comment as needed  Activities of Daily Living   ADL Screening (condition at time of admission) Independently performs ADLs?: No Does the patient have a NEW difficulty with bathing/dressing/toileting/self-feeding that is expected to last >3 days?: Yes (Initiates electronic notice to provider for possible OT consult) Does the patient have a NEW difficulty with getting in/out of bed, walking, or climbing stairs that is expected to last >3 days?: Yes (Initiates electronic notice to provider for possible PT consult) Does the patient have a NEW difficulty with communication that is expected to last >3 days?: No Is the patient deaf or have difficulty hearing?: Yes Does the patient have difficulty seeing, even when wearing glasses/contacts?: Yes Does the patient have difficulty concentrating, remembering, or making decisions?: Yes  Permission Sought/Granted Permission sought  to share information with : Facility Industrial/product designer granted to share information with : Yes, Verbal Permission Granted     Permission granted to share info w AGENCY: SNFs        Emotional Assessment Appearance:: Appears stated age Attitude/Demeanor/Rapport: Engaged Affect (typically observed): Accepting, Appropriate Orientation: : Oriented to Self, Oriented to Place, Oriented to  Time, Oriented to Situation Alcohol / Substance Use: Not Applicable Psych Involvement: No (comment)  Admission diagnosis:  Weakness [R53.1] Symptomatic anemia [D64.9] Anemia, unspecified type [D64.9] Patient Active Problem List   Diagnosis Date Noted   Weakness 08/02/2023   Thrombocytopenia (HCC) 08/02/2023   History of stroke 08/02/2023   Symptomatic anemia 08/01/2023   Diabetes (HCC) 10/17/2022   Stroke (cerebrum) (HCC) 10/10/2022   Essential hypertension 10/09/2022   Chronic diastolic CHF (congestive heart failure) (HCC) 10/09/2022   Acute CVA (cerebrovascular accident) (HCC) 10/08/2022  Malignant neoplasm of prostate (HCC) 05/25/2020   Obesity (BMI 30-39.9) 06/29/2019   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   Syncope 07/23/2017   Leukocytosis 07/23/2017   Hypokalemia 07/23/2017   UTI (urinary tract infection) 07/23/2017   Controlled type 2 diabetes mellitus without complication, without long-term current use of insulin (HCC) 02/10/2017   Mixed hyperlipidemia 02/10/2017   Hearing loss 02/10/2017   OSA (obstructive sleep apnea) 02/10/2017   History of colon polyps - adenomas 04/03/2010   PCP:  [name], MD Pharmacy:   [facility] [phone] [place], [place] - [address] AT [facility] [address] & [facility] [address] [place] [address] Phone: [phone] Fax: [phone]  [facility] [address] [place] [address] Phone: [phone] Fax: [phone]     Social Drivers of Health (SDOH) Social  History: SDOH Screenings   Food Insecurity: No Food Insecurity (08/01/2023)  Housing: Low Risk  (08/01/2023)  Transportation Needs: No Transportation Needs (08/01/2023)  Utilities: Not At Risk (08/01/2023)  Depression (PHQ2-9): Low Risk  (11/07/2022)  Social Connections: Socially Isolated (08/01/2023)  Tobacco Use: Medium Risk (08/01/2023)   SDOH Interventions: Social Connections Interventions: [facility] Provided, Inpatient TOC   Readmission Risk Interventions     No data to display

## 2023-08-03 NOTE — Progress Notes (Signed)
 PROGRESS NOTE                                                                                                                                                                                                             Patient Demographics:    Kenneth [name], is a 73 y.o. male, DOB - 01/31/1951, JYN:[id number]  Outpatient Primary MD for the patient is [name], MD    LOS - 2  Admit date - 08/01/2023    Chief Complaint  Patient presents with   Weakness       Brief Narrative (HPI from H&P)   73 y.o. male, with history of stroke, currently on DAPT, dyslipidemia, BPH, prostate cancer, insulin-dependent DM type II, hypertension, cervical stenosis, history of iron deficiency anemia, history of OSA does not wear CPAP, recent history of right ankle strain 2 weeks ago, presents to the hospital for 2-3 history of gradually progressive weakness, was also not eating or drinking well for a few days.  For the last couple of days he has been having problems getting out of the bed and ambulating as he is extremely weak, in the ER he was found to be hypotensive, had hypomagnesemia, was also found to have new onset anemia and I was called to admit the patient.    Subjective:   Patient in bed, appears comfortable, denies any headache, no fever, no chest pain or pressure, no shortness of breath , no abdominal pain. No new focal weakness.   Assessment  & Plan :    1.  Generalized weakness in a patient with gradually progressive anemia of chronic disease, dehydration, hypomagnesemia.   Patient has had chronic anemia for several years however there is at least a 3 g drop in his hemoglobin in the last few months, of note he has been started on DAPT for his recent stroke around that timeframe, he denies any hematemesis, no dark stools or blood in stool.  He likely has intermittent chronic GI blood loss, Hemoccult negative in the ER, place him on  PPI, for now continue DAPT, GI has been consulted, EGD done on 08/02/2023 did not show any acute bleed, check peripheral smear, anemia panel noted with possible mild iron deficiency and B12 deficiency both have been replaced, CBC is currently stable, will also have him follow-up with hematology outpatient along with GI  for continued anemia workup.  He is agreeable for transfusions if needed in the future, will get IV fluids for dehydration along with PT OT.  UA stable, anemia panel suggestive of mild iron deficiency and possible B12 deficiency, stable TSH.     2.  Hypomagnesemia and dehydration.  Hydrated with IV fluids, magnesium replaced stable.   3.  Recent stroke few months ago.  Continue DAPT for now, continue statin and Zetia.  No focal deficits but does have significant generalized weakness, PT OT to commence, he may require placement.     4.  Dyslipidemia.  Continue statin and Zetia.   5.  BPH.  Continue Flomax.  UA stable no UTI.   6.  Depression.  On Prozac and Lexapro combination continue, no acute issues.   7.  OSA.  Does not use CPAP.     8.  Essential hypertension.  Currently blood pressure low, dehydrated, hold blood pressure medications hydrate and monitor.     9.  History of cervical stenosis, recent right ankle strain.  Supportive care.     10.  Borderline iron and B12 deficiency.  Replaced.    11.  Hyponatremia.  has been hydrated adequately, stable.  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  12. DM type II.  On high doses of insulin at home, continue with q. [facility] S sliding scale.  Every 2 Accu-Cheks while n.p.o. on 08/02/2023.   CBG (last 3)  Recent Labs    08/02/23 1636 08/02/23 2107 08/03/23 0754  GLUCAP 141* 131* 140*    Lab Results  Component Value Date   HGBA1C 5.3 08/01/2023         Condition - Guarded  Family Communication  : Wife on the day of admission 08/01/2023 bedside, wife over the phone 08/02/2023, bedside 08/03/2023  Code Status : Full  code  Consults  : GI  PUD Prophylaxis : PPI   Procedures  :      EGD  Impression:               - The examined portions of the nasopharynx,                            oropharynx and larynx were normal.                           - Normal esophagus.                           - 3 cm hiatal hernia with prolapsing cardia.                           - Gastritis. Biopsied.                           - Normal examined duodenum.                           - No obvious source of GI bleeding. Doubt mild                            gastritis would have been a source of significant  bleed. Moderate Sedation:      N/A Recommendation:           - Patient has a contact number available for                            emergencies. The signs and symptoms of potential                            delayed complications were discussed with the                            patient. Return to normal activities tomorrow.                            Written discharge instructions were provided to the                            patient.                           - Resume previous diet.                           - Continue present medications including                            Brillinta/ASA.                           - Await pathology results.                           - Consider non-GI sources of anemia, given absence                            of overt bleeding, negative FOBT and concomitant                            thrombocytopenia.                           - Colonoscopy could be considered for further                            evaluation, but likely low yield, given his recent                            colonoscopy in 2021.                           - GI will sign off for now. If no other possible                            etiologies for anemia identified, or if patient has  overt GI bleeding, please reconsult GI.      Disposition Plan  :     Status is: Inpatient   DVT Prophylaxis  :    SCDs Start: 08/01/23 1239 Place TED hose Start: 08/01/23 1233    Lab Results  Component Value Date   PLT 108 (L) 08/03/2023    Diet :  Diet Order             Diet heart healthy/carb modified Room service appropriate? Yes; Fluid consistency: Thin  Diet effective now                    Inpatient Medications  Scheduled Meds:  albuterol  2.5 mg Nebulization TID   aspirin  81 mg Oral Daily   atorvastatin  40 mg Oral QHS   cyanocobalamin  1,000 mcg Subcutaneous Daily   [START ON 08/05/2023] vitamin B-12  1,000 mcg Oral Daily   escitalopram  20 mg Oral Daily   ezetimibe  10 mg Oral Daily   ferrous sulfate  325 mg Oral BID WC   folic acid  1 mg Oral Daily   insulin aspart  0-15 Units Subcutaneous TID WC   insulin aspart  0-5 Units Subcutaneous QHS   insulin glargine-yfgn  30 Units Subcutaneous Q1400   pantoprazole  40 mg Oral BID AC   tamsulosin  0.4 mg Oral QPM   ticagrelor  90 mg Oral BID   Continuous Infusions:   PRN Meds:.acetaminophen **OR** acetaminophen, bisacodyl, menthol-cetylpyridinium, ondansetron **OR** ondansetron (ZOFRAN) IV, phenol  Antibiotics  :    Anti-infectives (From admission, onward)    None         Objective:   Vitals:   08/03/23 0000 08/03/23 0400 08/03/23 0753 08/03/23 0804  BP: 110/68 (!) 120/57 (!) 119/54   Pulse:  68 68 70  Resp:  19 (!) 21 (!) 24  Temp:  98.5 F (36.9 C) 98.1 F (36.7 C)   TempSrc:  Oral    SpO2:  94% 92% 91%  Weight:      Height:        Wt Readings from Last 3 Encounters:  08/01/23 86.6 kg  05/21/23 88.7 kg  05/01/23 89.3 kg     Intake/Output Summary (Last 24 hours) at 08/03/2023 0954 Last data filed at 08/03/2023 0500 Gross per 24 hour  Intake 470 ml  Output 600 ml  Net -130 ml     Physical Exam  Awake Alert, No new F.N deficits, Normal affect [place].AT,PERRAL Supple Neck, No JVD,   Symmetrical Chest wall movement, Good air movement  bilaterally, CTAB RRR,No Gallops,Rubs or new Murmurs,  +ve B.Sounds, Abd Soft, No tenderness,   No Cyanosis, Clubbing or edema        Data Review:    Recent Labs  Lab 08/01/23 0630 08/01/23 1848 08/02/23 0549 08/03/23 0513  WBC 7.9 7.9 7.8 7.5  HGB 9.0* 8.7* 8.7* 8.8*  HCT 26.8* 24.9* 25.0* 24.9*  PLT 87* 99* 107* 108*  MCV 89.9 87.1 85.3 85.6  MCH 30.2 30.4 29.7 30.2  MCHC 33.6 34.9 34.8 35.3  RDW 14.6 14.6 14.8 14.8  LYMPHSABS 1.0  --  0.2* 1.0  MONOABS 1.9*  --  0.5 2.0*  EOSABS 0.1  --  0.0 0.0  BASOSABS 0.0  --  0.0 0.0    Recent Labs  Lab 08/01/23 0630 08/01/23 0913 08/01/23 1310 08/02/23 0549 08/03/23 0513  NA 132*  --   --  131* 131*  K 4.1  --   --  3.7 3.7  CL 101  --   --  100 102  CO2 21*  --   --  22 24  ANIONGAP 10  --   --  9 5  GLUCOSE 168*  --   --  143* 146*  BUN 17  --   --  12 17  CREATININE 0.87  --   --  0.81 0.87  AST 26  --   --  19 25  ALT 15  --   --  17 22  ALKPHOS 43  --   --  43 45  BILITOT 1.9*  --   --  1.4* 1.3*  ALBUMIN 2.6*  --   --  2.5* 2.4*  TSH  --   --  1.499  --   --   HGBA1C 5.3  --   --   --   --   BNP  --  33.0  --  72.3 53.6  MG 1.4*  --   --  1.7 1.6*  PHOS  --   --   --  3.6 3.6  CALCIUM 8.1*  --   --  8.3* 7.8*      Recent Labs  Lab 08/01/23 0630 08/01/23 0913 08/01/23 1310 08/02/23 0549 08/03/23 0513  TSH  --   --  1.499  --   --   HGBA1C 5.3  --   --   --   --   BNP  --  33.0  --  72.3 53.6  MG 1.4*  --   --  1.7 1.6*  CALCIUM 8.1*  --   --  8.3* 7.8*    --------------------------------------------------------------------------------------------------------------- Lab Results  Component Value Date   CHOL 79 04/07/2023   HDL 17 (L) 04/07/2023   LDLCALC 47 04/07/2023   TRIG 75 04/07/2023   CHOLHDL 4.6 04/07/2023    Lab Results  Component Value Date   HGBA1C 5.3 08/01/2023   Recent Labs    08/01/23 1310  TSH 1.499  FREET4 1.06   Recent Labs    08/01/23 1310  VITAMINB12 142*   FOLATE 14.9  FERRITIN 577*  TIBC 217*  IRON 36*  RETICCTPCT 3.0   ------------------------------------------------------------------------------------------------------------------ Cardiac Enzymes No results for input(s): "CKMB", "TROPONINI", "MYOGLOBIN" in the last 168 hours.  Invalid input(s): "CK"  Micro Results Recent Results (from the past 240 hours)  Resp panel by RT-PCR (RSV, Flu A&B, Covid) Anterior Nasal Swab     Status: None   Collection Time: 08/01/23  7:13 AM   Specimen: Anterior Nasal Swab  Result Value Ref Range Status   SARS Coronavirus 2 by RT PCR NEGATIVE NEGATIVE Final   Influenza A by PCR NEGATIVE NEGATIVE Final   Influenza B by PCR NEGATIVE NEGATIVE Final    Comment: (NOTE) The Xpert Xpress SARS-CoV-2/FLU/RSV plus assay is intended as an aid in the diagnosis of influenza from Nasopharyngeal swab specimens and should not be used as a sole basis for treatment. Nasal washings and aspirates are unacceptable for Xpert Xpress SARS-CoV-2/FLU/RSV testing.  Fact Sheet for Patients: BloggerCourse.com  Fact Sheet for Healthcare Providers: SeriousBroker.it  This test is not yet approved or cleared by the [place] FDA and has been authorized for detection and/or diagnosis of SARS-CoV-2 by FDA under an Emergency Use Authorization (EUA). This EUA will remain in effect (meaning this test can be used) for the duration of the COVID-19 declaration under Section 564(b)(1) of the Act, 21 U.S.C. section 360bbb-3(b)(1), unless the authorization is terminated or revoked.  Resp Syncytial Virus by PCR NEGATIVE NEGATIVE Final    Comment: (NOTE) Fact Sheet for Patients: BloggerCourse.com  Fact Sheet for Healthcare Providers: SeriousBroker.it  This test is not yet approved or cleared by the [place] FDA and has been authorized for detection and/or diagnosis of  SARS-CoV-2 by FDA under an Emergency Use Authorization (EUA). This EUA will remain in effect (meaning this test can be used) for the duration of the COVID-19 declaration under Section 564(b)(1) of the Act, 21 U.S.C. section 360bbb-3(b)(1), unless the authorization is terminated or revoked.  Performed at [facility], [address], [place], [place] [address]     Radiology Report No results found.    Signature  -   Susa Raring M.D on 08/03/2023 at 9:54 AM   -  To page go to [email or web address]

## 2023-08-03 NOTE — [place] FL2 (Signed)
  MEDICAID [id number] LEVEL OF CARE FORM     IDENTIFICATION  Patient Name: Kenneth [name] Birthdate: [birth year] Sex: male Admission Date (Current Location): 08/01/2023  [facility] and [place] Number:  Producer, television/film/video and Address:  [facility] [place], [address] [place], [place] [address]      Provider Number: [id number]  Attending Physician Name and Address:  Leroy Sea, MD  Relative Name and Phone Number:       Current Level of Care: Hospital Recommended Level of Care: Skilled Nursing Facility Prior Approval Number:    Date Approved/Denied:   PASRR Number: [id number]  Discharge Plan: SNF    Current Diagnoses: Patient Active Problem List   Diagnosis Date Noted   Weakness 08/02/2023   Thrombocytopenia (HCC) 08/02/2023   History of stroke 08/02/2023   Symptomatic anemia 08/01/2023   Diabetes (HCC) 10/17/2022   Stroke (cerebrum) (HCC) 10/10/2022   Essential hypertension 10/09/2022   Chronic diastolic CHF (congestive heart failure) (HCC) 10/09/2022   Acute CVA (cerebrovascular accident) (HCC) 10/08/2022   Malignant neoplasm of prostate (HCC) 05/25/2020   Obesity (BMI 30-39.9) 06/29/2019   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   Syncope 07/23/2017   Leukocytosis 07/23/2017   Hypokalemia 07/23/2017   UTI (urinary tract infection) 07/23/2017   Controlled type 2 diabetes mellitus without complication, without long-term current use of insulin (HCC) 02/10/2017   Mixed hyperlipidemia 02/10/2017   Hearing loss 02/10/2017   OSA (obstructive sleep apnea) 02/10/2017   History of colon polyps - adenomas 04/03/2010    Orientation RESPIRATION BLADDER Height & Weight     Self, Time, Situation, Place  Normal Incontinent, External catheter Weight: 191 lb (86.6 kg) Height:  5\' 11"  (180.3 cm)  BEHAVIORAL SYMPTOMS/MOOD NEUROLOGICAL BOWEL NUTRITION STATUS      Continent Diet (See dc summary)  AMBULATORY STATUS COMMUNICATION OF NEEDS Skin   Extensive  Assist Verbally Other (Comment) (Incontinence associated dermatitis)                       Personal Care Assistance Level of Assistance  Bathing, Feeding, Dressing Bathing Assistance: Maximum assistance Feeding assistance: Independent Dressing Assistance: Limited assistance     Functional Limitations Info  Sight, Hearing Sight Info: Impaired Hearing Info: Impaired      SPECIAL CARE FACTORS FREQUENCY  PT (By licensed PT), OT (By licensed OT)     PT Frequency: 5x/week OT Frequency: 5x/week            Contractures Contractures Info: Not present    Additional Factors Info  Code Status, Allergies, Psychotropic, Insulin Sliding Scale Code Status Info: Full Allergies Info: Jardiance (Empagliflozin), Fluoxetine, Penicillins, Silodosin Psychotropic Info: Lexapro Insulin Sliding Scale Info: See dc summary       Current Medications (08/03/2023):  This is the current hospital active medication list Current Facility-Administered Medications  Medication Dose Route Frequency Provider Last Rate Last Admin   acetaminophen (TYLENOL) tablet 650 mg  650 mg Oral Q6H PRN Leroy Sea, MD   650 mg at 08/03/23 1007   Or   acetaminophen (TYLENOL) suppository 650 mg  650 mg Rectal Q6H PRN Leroy Sea, MD       albuterol (PROVENTIL) (2.5 MG/3ML) 0.083% nebulizer solution 2.5 mg  2.5 mg Nebulization TID Leroy Sea, MD   2.5 mg at 08/03/23 4132   aspirin chewable tablet 81 mg  81 mg Oral Daily Leroy Sea, MD   81 mg at 08/03/23 1004  atorvastatin (LIPITOR) tablet 40 mg  40 mg Oral QHS Leroy Sea, MD   40 mg at 08/02/23 2049   bisacodyl (DULCOLAX) EC tablet 5 mg  5 mg Oral Daily PRN Leroy Sea, MD       cyanocobalamin (VITAMIN B12) injection 1,000 mcg  1,000 mcg Subcutaneous Daily Leroy Sea, MD   1,000 mcg at 08/03/23 1008   [START ON 08/05/2023] cyanocobalamin (VITAMIN B12) tablet 1,000 mcg  1,000 mcg Oral Daily Leroy Sea, MD        escitalopram (LEXAPRO) tablet 20 mg  20 mg Oral Daily Leroy Sea, MD   20 mg at 08/03/23 1004   ezetimibe (ZETIA) tablet 10 mg  10 mg Oral Daily Leroy Sea, MD   10 mg at 08/03/23 1004   ferrous sulfate tablet 325 mg  325 mg Oral BID WC Leroy Sea, MD   325 mg at 08/03/23 1005   folic acid (FOLVITE) tablet 1 mg  1 mg Oral Daily Leroy Sea, MD   1 mg at 08/03/23 1004   insulin aspart (novoLOG) injection 0-15 Units  0-15 Units Subcutaneous TID WC Leroy Sea, MD   2 Units at 08/03/23 1010   insulin aspart (novoLOG) injection 0-5 Units  0-5 Units Subcutaneous QHS Leroy Sea, MD       insulin glargine-yfgn (SEMGLEE) injection 30 Units  30 Units Subcutaneous Q1400 Leroy Sea, MD       menthol-cetylpyridinium (CEPACOL) lozenge 3 mg  1 lozenge Oral PRN Howerter, Justin B, DO       ondansetron (ZOFRAN) tablet 4 mg  4 mg Oral Q6H PRN Leroy Sea, MD       Or   ondansetron (ZOFRAN) injection 4 mg  4 mg Intravenous Q6H PRN Leroy Sea, MD       pantoprazole (PROTONIX) EC tablet 40 mg  40 mg Oral BID AC Leroy Sea, MD   40 mg at 08/03/23 1010   phenol (CHLORASEPTIC) mouth spray 1 spray  1 spray Mouth/Throat PRN Howerter, Justin B, DO   1 spray at 08/03/23 0528   tamsulosin (FLOMAX) capsule 0.4 mg  0.4 mg Oral QPM Leroy Sea, MD   0.4 mg at 08/02/23 1739   ticagrelor (BRILINTA) tablet 90 mg  90 mg Oral BID Leroy Sea, MD   90 mg at 08/03/23 1004     Discharge Medications: Please see discharge summary for a list of discharge medications.  Relevant Imaging Results:  Relevant Lab Results:   Additional Information SSN: [id number] [place], [place]

## 2023-08-03 NOTE — Plan of Care (Signed)
  Problem: Coping: Goal: Ability to adjust to condition or change in health will improve 08/03/2023 0232 by Georgia Duff, RN Outcome: Progressing 08/03/2023 0230 by Georgia Duff, RN Outcome: Progressing   Problem: Skin Integrity: Goal: Risk for impaired skin integrity will decrease Outcome: Progressing   Problem: Clinical Measurements: Goal: Ability to maintain clinical measurements within normal limits will improve Outcome: Progressing Goal: Will remain free from infection Outcome: Progressing   Problem: Activity: Goal: Risk for activity intolerance will decrease Outcome: Progressing

## 2023-08-03 NOTE — Progress Notes (Signed)
 RT called to patient's room. Patient denies any pain and any shortness of breath. Patient's 02 sats 94% on RA. Patient placed on 2lnc. Patient's vitals are stable at this time. RN asked to call RT if needed. RT will continue to monitor.

## 2023-08-04 ENCOUNTER — Inpatient Hospital Stay (HOSPITAL_COMMUNITY)

## 2023-08-04 DIAGNOSIS — R4182 Altered mental status, unspecified: Secondary | ICD-10-CM | POA: Diagnosis not present

## 2023-08-04 DIAGNOSIS — F32A Depression, unspecified: Secondary | ICD-10-CM | POA: Diagnosis not present

## 2023-08-04 DIAGNOSIS — D649 Anemia, unspecified: Secondary | ICD-10-CM | POA: Diagnosis not present

## 2023-08-04 DIAGNOSIS — R569 Unspecified convulsions: Secondary | ICD-10-CM

## 2023-08-04 DIAGNOSIS — R531 Weakness: Secondary | ICD-10-CM

## 2023-08-04 LAB — BLOOD GAS, ARTERIAL
Acid-Base Excess: 3.4 mmol/L — ABNORMAL HIGH (ref 0.0–2.0)
Bicarbonate: 26.8 mmol/L (ref 20.0–28.0)
Drawn by: 137641
O2 Saturation: 100 %
Patient temperature: 37.5
pCO2 arterial: 37 mmHg (ref 32–48)
pH, Arterial: 7.47 — ABNORMAL HIGH (ref 7.35–7.45)
pO2, Arterial: 128 mmHg — ABNORMAL HIGH (ref 83–108)

## 2023-08-04 LAB — GLUCOSE, CAPILLARY
Glucose-Capillary: 142 mg/dL — ABNORMAL HIGH (ref 70–99)
Glucose-Capillary: 166 mg/dL — ABNORMAL HIGH (ref 70–99)
Glucose-Capillary: 169 mg/dL — ABNORMAL HIGH (ref 70–99)
Glucose-Capillary: 157 mg/dL — ABNORMAL HIGH (ref 70–99)
Glucose-Capillary: 173 mg/dL — ABNORMAL HIGH (ref 70–99)

## 2023-08-04 LAB — AMMONIA: Ammonia: 17 umol/L (ref 9–35)

## 2023-08-04 LAB — CBC WITH DIFFERENTIAL/PLATELET
Abs Immature Granulocytes: 0 10*3/uL (ref 0.00–0.07)
Basophils Absolute: 0.1 10*3/uL (ref 0.0–0.1)
Basophils Relative: 1 %
Eosinophils Absolute: 0 10*3/uL (ref 0.0–0.5)
Eosinophils Relative: 0 %
HCT: 24.7 % — ABNORMAL LOW (ref 39.0–52.0)
Hemoglobin: 8.7 g/dL — ABNORMAL LOW (ref 13.0–17.0)
Lymphocytes Relative: 6 %
Lymphs Abs: 0.5 10*3/uL — ABNORMAL LOW (ref 0.7–4.0)
MCH: 29.8 pg (ref 26.0–34.0)
MCHC: 35.2 g/dL (ref 30.0–36.0)
MCV: 84.6 fL (ref 80.0–100.0)
Monocytes Absolute: 0.7 10*3/uL (ref 0.1–1.0)
Monocytes Relative: 8 %
Neutro Abs: 7.2 10*3/uL (ref 1.7–7.7)
Neutrophils Relative %: 85 %
Platelets: 101 10*3/uL — ABNORMAL LOW (ref 150–400)
RBC: 2.92 MIL/uL — ABNORMAL LOW (ref 4.22–5.81)
RDW: 14.9 % (ref 11.5–15.5)
WBC: 8.5 10*3/uL (ref 4.0–10.5)
nRBC: 0 % (ref 0.0–0.2)
nRBC: 0 /100{WBCs}

## 2023-08-04 LAB — COMPREHENSIVE METABOLIC PANEL
ALT: 40 U/L (ref 0–44)
AST: 47 U/L — ABNORMAL HIGH (ref 15–41)
Albumin: 2.2 g/dL — ABNORMAL LOW (ref 3.5–5.0)
Alkaline Phosphatase: 60 U/L (ref 38–126)
Anion gap: 5 (ref 5–15)
BUN: 20 mg/dL (ref 8–23)
CO2: 24 mmol/L (ref 22–32)
Calcium: 7.7 mg/dL — ABNORMAL LOW (ref 8.9–10.3)
Chloride: 101 mmol/L (ref 98–111)
Creatinine, Ser: 0.89 mg/dL (ref 0.61–1.24)
GFR, Estimated: >60 mL/min (ref >60)
Glucose, Bld: 159 mg/dL — ABNORMAL HIGH (ref 70–99)
Potassium: 3.7 mmol/L (ref 3.5–5.1)
Sodium: 130 mmol/L — ABNORMAL LOW (ref 135–145)
Total Bilirubin: 1.3 mg/dL — ABNORMAL HIGH (ref 0.0–1.2)
Total Protein: 4.6 g/dL — ABNORMAL LOW (ref 6.5–8.1)

## 2023-08-04 LAB — SURGICAL PATHOLOGY

## 2023-08-04 LAB — TECHNOLOGIST SMEAR REVIEW

## 2023-08-04 LAB — MAGNESIUM: Magnesium: 1.9 mg/dL (ref 1.7–2.4)

## 2023-08-04 LAB — PHOSPHORUS: Phosphorus: 3.8 mg/dL (ref 2.5–4.6)

## 2023-08-04 LAB — BRAIN NATRIURETIC PEPTIDE: B Natriuretic Peptide: 27.8 pg/mL (ref 0.0–100.0)

## 2023-08-04 LAB — UREA NITROGEN, URINE: Urea Nitrogen, Ur: 902 mg/dL

## 2023-08-04 MED ORDER — THIAMINE MONONITRATE 100 MG PO TABS
100.0000 mg | ORAL_TABLET | Freq: Every morning | ORAL | Status: DC
  Administered 2023-08-05 – 2023-08-06 (×2): 100 mg via ORAL
  Filled 2023-08-04 (×2): qty 1

## 2023-08-04 MED ORDER — MEGESTROL ACETATE 40 MG PO TABS
40.0000 mg | ORAL_TABLET | Freq: Every day | ORAL | Status: DC
  Administered 2023-08-05 – 2023-08-06 (×2): 40 mg via ORAL
  Filled 2023-08-04 (×6): qty 1

## 2023-08-04 MED ORDER — FUROSEMIDE 10 MG/ML IJ SOLN
20.0000 mg | Freq: Once | INTRAMUSCULAR | Status: DC
  Filled 2023-08-04: qty 2

## 2023-08-04 MED ORDER — ALBUTEROL SULFATE (2.5 MG/3ML) 0.083% IN NEBU: 2.5000 mg | INHALATION_SOLUTION | Freq: Four times a day (QID) | RESPIRATORY_TRACT | Status: DC | PRN

## 2023-08-04 MED ORDER — GADOBUTROL 1 MMOL/ML IV SOLN
8.5000 mL | Freq: Once | INTRAVENOUS | Status: AC | PRN
  Administered 2023-08-04: 8.5 mL via INTRAVENOUS

## 2023-08-04 MED ORDER — LACTATED RINGERS IV BOLUS
1000.0000 mL | Freq: Once | INTRAVENOUS | Status: AC
  Administered 2023-08-04: 1000 mL via INTRAVENOUS

## 2023-08-04 NOTE — Plan of Care (Signed)

## 2023-08-04 NOTE — Progress Notes (Signed)
 PROGRESS NOTE                                                                                                                                                                                                             Patient Demographics:    Kenneth [name], is a 73 y.o. male, DOB - 12-28-1950, UJW:[id number]  Outpatient Primary MD for the patient is [name], MD    LOS - 3  Admit date - 08/01/2023    Chief Complaint  Patient presents with   Weakness       Brief Narrative (HPI from H&P)   73 y.o. male, with history of stroke, currently on DAPT, dyslipidemia, BPH, prostate cancer, insulin-dependent DM type II, hypertension, cervical stenosis, history of iron deficiency anemia, history of OSA does not wear CPAP, recent history of right ankle strain 2 weeks ago, presents to the hospital for 2-3 history of gradually progressive weakness, was also not eating or drinking well for a few days.  For the last couple of days he has been having problems getting out of the bed and ambulating as he is extremely weak, in the ER he was found to be hypotensive, had hypomagnesemia, was also found to have new onset anemia and I was called to admit the patient.    Subjective:   Patient in bed, appears comfortable, denies any headache, no fever, no chest pain or pressure, no shortness of breath , no abdominal pain. No new focal weakness.   Assessment  & Plan :    1.  Gradually progressive generalized weakness in a patient with gradually progressive anemia of chronic disease, dehydration, hypomagnesemia.   Patient has had chronic anemia for several years however there is at least a 3 g drop in his hemoglobin in the last few months, of note he has been started on DAPT for his recent stroke around that timeframe, he denies any hematemesis, no dark stools or blood in stool.  He likely has intermittent chronic GI blood loss, Hemoccult negative in  the ER, place him on PPI, for now continue DAPT, GI has been consulted, EGD done on 08/02/2023 did not show any acute bleed, check peripheral smear, anemia panel noted with possible mild iron deficiency and B12 deficiency both have been replaced, CBC is currently stable, will also have him follow-up with hematology outpatient along  with GI for continued anemia workup.  EGD done and except mild gastritis unremarkable, no active bleeding, TSH stable, iron B12 being replaced.  PT OT and advance activity.  Wife requesting neurology input which will be obtained.  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      2.  Metabolic encephalopathy, generalized weakness.  Underlying depression.  No focal deficits, currently see above, wondering if depression is playing a large role but will complete medical workup, TSH is stable, ABG is stable, will check ammonia, CT head, EEG and obtain neuro input.  If medical workup unremarkable then will involve psych for worsening depression.   3.  Recent stroke few months ago.  Continue DAPT for now, continue statin and Zetia.  No focal deficits but does have significant generalized weakness, PT OT to commence, he may require placement.     4.  Dyslipidemia.  Continue statin and Zetia.   5.  BPH.  Continue Flomax.  UA stable no UTI.   6.  Depression.  On Prozac and Lexapro combination continue, no acute issues.  Wondering if depression is playing a large lower and his lack of appetite, flat affect and general decline.  If medical workup negative will involve psych as well   7.  OSA.  Does not use CPAP.     8.  Essential hypertension.  Currently blood pressure low, dehydrated, hold blood pressure medications hydrate and monitor.     9.  History of cervical stenosis,  recent right ankle strain.  Supportive care.     10.  Borderline iron and B12 deficiency.  Replaced.    11.  Hyponatremia.  has been hydrated adequately, stable.  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  Fluid restriction and monitor.  12. Hypomagnesemia and dehydration.  Hydrated with IV fluids, magnesium replaced stable.  13.  Oral intake and appetite.  Add Megace.  Treat depression.    14. DM type II.  On high doses of insulin at home, continue with q. [facility] S sliding scale.  Every 2 Accu-Cheks while n.p.o. on 08/02/2023.   CBG (last 3)  Recent Labs    08/03/23 1646 08/03/23 2117 08/04/23 0630  GLUCAP 193* 165* 142*    Lab Results  Component Value Date   HGBA1C 5.3 08/01/2023         Condition - Guarded  Family Communication  : Wife on the day of admission 08/01/2023 bedside, wife over the phone 08/02/2023, bedside 08/03/2023  Code Status : Full code  Consults  : GI  PUD Prophylaxis : PPI   Procedures  :      EGD  Impression:               - The examined portions of the nasopharynx,                            oropharynx and larynx were normal.                           - Normal esophagus.                           - 3 cm hiatal hernia with prolapsing cardia.                           -  Gastritis. Biopsied.                           - Normal examined duodenum.                           - No obvious source of GI bleeding. Doubt mild                            gastritis would have been a source of significant                            bleed. Moderate Sedation:      N/A Recommendation:           - Patient has a contact number available for                            emergencies. The signs and symptoms of potential                            delayed complications were discussed with the                            patient. Return to normal activities tomorrow.                            Written discharge instructions were provided to the                             patient.                           - Resume previous diet.                           - Continue present medications including                            Brillinta/ASA.                           - Await pathology results.                           - Consider non-GI sources of anemia, given absence                            of overt bleeding, negative FOBT and concomitant                            thrombocytopenia.                           - Colonoscopy could be considered for further                            evaluation, but likely low yield,  given his recent                            colonoscopy in 2021.                           - GI will sign off for now. If no other possible                            etiologies for anemia identified, or if patient has                            overt GI bleeding, please reconsult GI.      Disposition Plan  :    Status is: Inpatient   DVT Prophylaxis  :    SCDs Start: 08/01/23 1239 Place TED hose Start: 08/01/23 1233    Lab Results  Component Value Date   PLT 101 (L) 08/04/2023    Diet :  Diet Order             Diet heart healthy/carb modified Room service appropriate? Yes; Fluid consistency: Thin  Diet effective now                    Inpatient Medications  Scheduled Meds:  albuterol  2.5 mg Nebulization TID   aspirin  81 mg Oral Daily   atorvastatin  40 mg Oral QHS   cyanocobalamin  1,000 mcg Subcutaneous Daily   [START ON 08/05/2023] vitamin B-12  1,000 mcg Oral Daily   escitalopram  20 mg Oral Daily   ezetimibe  10 mg Oral Daily   ferrous sulfate  325 mg Oral BID WC   folic acid  1 mg Oral Daily   insulin aspart  0-15 Units Subcutaneous TID WC   insulin aspart  0-5 Units Subcutaneous QHS   insulin glargine-yfgn  30 Units Subcutaneous Q1400   megestrol  40 mg Oral Daily   pantoprazole  40 mg Oral BID AC   tamsulosin  0.4 mg Oral QPM   ticagrelor  90 mg Oral BID   Continuous Infusions:   PRN  Meds:.acetaminophen **OR** acetaminophen, bisacodyl, menthol-cetylpyridinium, [DISCONTINUED] ondansetron **OR** ondansetron (ZOFRAN) IV, phenol  Antibiotics  :    Anti-infectives (From admission, onward)    None         Objective:   Vitals:   08/03/23 1945 08/04/23 0001 08/04/23 0433 08/04/23 0820  BP: (!) 116/56 (!) 116/52 (!) 125/55   Pulse: 74 88 88   Resp: (!) 22 (!) 22 (!) 25 14  Temp: 97.9 F (36.6 C) 97.6 F (36.4 C) 99.5 F (37.5 C)   TempSrc: Oral Oral Oral   SpO2: 97% 94% 94% 95%  Weight:      Height:        Wt Readings from Last 3 Encounters:  08/01/23 86.6 kg  05/21/23 88.7 kg  05/01/23 89.3 kg     Intake/Output Summary (Last 24 hours) at 08/04/2023 0845 Last data filed at 08/04/2023 0443 Gross per 24 hour  Intake 100 ml  Output 700 ml  Net -600 ml     Physical Exam  Awake but pleasantly confused, No new F.N deficits, overall quite deconditioned [place].AT,PERRAL Supple Neck, No JVD,   Symmetrical Chest wall movement, Good air movement bilaterally, CTAB RRR,No Gallops,Rubs or new Murmurs,  +  ve B.Sounds, Abd Soft, No tenderness,   No Cyanosis, Clubbing or edema        Data Review:    Recent Labs  Lab 08/01/23 0630 08/01/23 1848 08/02/23 0549 08/03/23 0513 08/03/23 1834 08/04/23 0452  WBC 7.9 7.9 7.8 7.5 8.9 8.5  HGB 9.0* 8.7* 8.7* 8.8* 9.6* 8.7*  HCT 26.8* 24.9* 25.0* 24.9* 27.3* 24.7*  PLT 87* 99* 107* 108* 101* 101*  MCV 89.9 87.1 85.3 85.6 86.7 84.6  MCH 30.2 30.4 29.7 30.2 30.5 29.8  MCHC 33.6 34.9 34.8 35.3 35.2 35.2  RDW 14.6 14.6 14.8 14.8 15.2 14.9  LYMPHSABS 1.0  --  0.2* 1.0 1.0 0.5*  MONOABS 1.9*  --  0.5 2.0* 1.9* 0.7  EOSABS 0.1  --  0.0 0.0 0.0 0.0  BASOSABS 0.0  --  0.0 0.0 0.0 0.1    Recent Labs  Lab 08/01/23 0630 08/01/23 0913 08/01/23 1310 08/02/23 0549 08/03/23 0513 08/04/23 0452  NA 132*  --   --  131* 131* 130*  K 4.1  --   --  3.7 3.7 3.7  CL 101  --   --  100 102 101  CO2 21*  --   --  22 24 24    ANIONGAP 10  --   --  9 5 5   GLUCOSE 168*  --   --  143* 146* 159*  BUN 17  --   --  12 17 20   CREATININE 0.87  --   --  0.81 0.87 0.89  AST 26  --   --  19 25 47*  ALT 15  --   --  17 22 40  ALKPHOS 43  --   --  43 45 60  BILITOT 1.9*  --   --  1.4* 1.3* 1.3*  ALBUMIN 2.6*  --   --  2.5* 2.4* 2.2*  TSH  --   --  1.499  --   --   --   HGBA1C 5.3  --   --   --   --   --   BNP  --  33.0  --  72.3 53.6 27.8  MG 1.4*  --   --  1.7 1.6* 1.9  PHOS  --   --   --  3.6 3.6 3.8  CALCIUM 8.1*  --   --  8.3* 7.8* 7.7*      Recent Labs  Lab 08/01/23 0630 08/01/23 0913 08/01/23 1310 08/02/23 0549 08/03/23 0513 08/04/23 0452  TSH  --   --  1.499  --   --   --   HGBA1C 5.3  --   --   --   --   --   BNP  --  33.0  --  72.3 53.6 27.8  MG 1.4*  --   --  1.7 1.6* 1.9  CALCIUM 8.1*  --   --  8.3* 7.8* 7.7*    --------------------------------------------------------------------------------------------------------------- Lab Results  Component Value Date   CHOL 79 04/07/2023   HDL 17 (L) 04/07/2023   LDLCALC 47 04/07/2023   TRIG 75 04/07/2023   CHOLHDL 4.6 04/07/2023    Lab Results  Component Value Date   HGBA1C 5.3 08/01/2023   Recent Labs    08/01/23 1310  TSH 1.499  FREET4 1.06   Recent Labs    08/01/23 1310  VITAMINB12 142*  FOLATE 14.9  FERRITIN 577*  TIBC 217*  IRON 36*  RETICCTPCT 3.0   ------------------------------------------------------------------------------------------------------------------ Cardiac Enzymes No results for  input(s): "CKMB", "TROPONINI", "MYOGLOBIN" in the last 168 hours.  Invalid input(s): "CK"  Micro Results Recent Results (from the past 240 hours)  Resp panel by RT-PCR (RSV, Flu A&B, Covid) Anterior Nasal Swab     Status: None   Collection Time: 08/01/23  7:13 AM   Specimen: Anterior Nasal Swab  Result Value Ref Range Status   SARS Coronavirus 2 by RT PCR NEGATIVE NEGATIVE Final   Influenza A by PCR NEGATIVE NEGATIVE Final    Influenza B by PCR NEGATIVE NEGATIVE Final    Comment: (NOTE) The Xpert Xpress SARS-CoV-2/FLU/RSV plus assay is intended as an aid in the diagnosis of influenza from Nasopharyngeal swab specimens and should not be used as a sole basis for treatment. Nasal washings and aspirates are unacceptable for Xpert Xpress SARS-CoV-2/FLU/RSV testing.  Fact Sheet for Patients: BloggerCourse.com  Fact Sheet for Healthcare Providers: SeriousBroker.it  This test is not yet approved or cleared by the [place] FDA and has been authorized for detection and/or diagnosis of SARS-CoV-2 by FDA under an Emergency Use Authorization (EUA). This EUA will remain in effect (meaning this test can be used) for the duration of the COVID-19 declaration under Section 564(b)(1) of the Act, 21 U.S.C. section 360bbb-3(b)(1), unless the authorization is terminated or revoked.     Resp Syncytial Virus by PCR NEGATIVE NEGATIVE Final    Comment: (NOTE) Fact Sheet for Patients: BloggerCourse.com  Fact Sheet for Healthcare Providers: SeriousBroker.it  This test is not yet approved or cleared by the [place] FDA and has been authorized for detection and/or diagnosis of SARS-CoV-2 by FDA under an Emergency Use Authorization (EUA). This EUA will remain in effect (meaning this test can be used) for the duration of the COVID-19 declaration under Section 564(b)(1) of the Act, 21 U.S.C. section 360bbb-3(b)(1), unless the authorization is terminated or revoked.  Performed at [facility], [address] [place], [place] [address]     Radiology Report No results found.    Signature  -   Susa Raring M.D on 08/04/2023 at 8:45 AM   -  To page go to [email or web address]

## 2023-08-04 NOTE — Progress Notes (Signed)
 Physical Therapy Treatment Patient Details Name: Kenneth [name] MRN: [id number] DOB: 08-23-1950 Today's Date: 08/04/2023   History of Present Illness Kenneth [name] is a 73 y.o. male presents to the hospital for 2-3 history of gradually progressive weakness, was also not eating or drinking well for a few days. Past medical history of stroke, currently on DAPT, dyslipidemia, BPH, prostate cancer, insulin-dependent DM type II, hypertension, cervical stenosis, history of iron deficiency anemia, history of OSA does not wear CPAP, recent history of right ankle strain 2 weeks ago. CT - negative MRI - small acute to subacute infarcts L frontal and parietal lobes and R temporal white matter    PT Comments  Pt received in bed, lethargic with flat affect. Session focused on mobility progression out of bed. Upon sitting EOB, pt noted to be soiled with BM. Stood pt for RN to perform pericare. Unable to get pt clean in standing requiring return to supine for further pericare and bed linen change. Pt hypersensitive with c/o pain with touch at multiple sites. Pt also citing RLE and back as sources of pain.   He required max assist rolling, +2 max assist supine<>sit, and +2 max assist sit<>stand in stedy. Pt stood in stedy approx 1 min with +2 mod assist. Pt supine in bed at end of session.     If plan is discharge home, recommend the following: Two people to help with walking and/or transfers;A lot of help with bathing/dressing/bathroom;Assistance with cooking/housework;Direct supervision/assist for medications management;Assist for transportation;Help with stairs or ramp for entrance   Can travel by private vehicle     No  Equipment Recommendations  Hoyer lift;Hospital bed;Wheelchair cushion (measurements PT);Wheelchair (measurements PT);BSC/3in1    Recommendations for Other Services       Precautions / Restrictions Precautions Precautions: Fall;Other (comment) Precaution/Restrictions Comments: R  ankle/ RLE swollen Required Braces or Orthoses: Other Brace Other Brace: R ankle ACE wrap in place     Mobility  Bed Mobility Overal bed mobility: Needs Assistance Bed Mobility: Supine to Sit, Sit to Supine, Rolling Rolling: Max assist, Used rails   Supine to sit: +2 for physical assistance, Max assist, HOB elevated Sit to supine: +2 for physical assistance, Max assist   General bed mobility comments: assist for all aspects of mobility    Transfers Overall transfer level: Needs assistance   Transfers: Sit to/from Stand Sit to Stand: +2 physical assistance, Max assist, From elevated surface           General transfer comment: STS in stedy. Assist to power up from elevated bed. Pt soiled requiring return to bed for hygeine Transfer via Lift Equipment: Stedy  Ambulation/Gait               General Gait Details: unable   Stairs             Wheelchair Mobility     Tilt Bed    Modified Rankin (Stroke Patients Only)       Balance Overall balance assessment: Needs assistance Sitting-balance support: Bilateral upper extremity supported, Feet supported Sitting balance-Leahy Scale: Poor     Standing balance support: Bilateral upper extremity supported, During functional activity Standing balance-Leahy Scale: Poor Standing balance comment: +2 mod static stand in stedy x 1 minute                            Communication Communication Communication: Impaired Factors Affecting Communication: Hearing impaired (Congenital hearing loss. Does best with reading  lips.)  Cognition Arousal: Lethargic Behavior During Therapy: Flat affect   PT - Cognitive impairments: Memory, Awareness, Initiation, Sequencing, Problem solving                         Following commands: Impaired Following commands impaired: Follows one step commands with increased time, Only follows one step commands consistently    Cueing Cueing Techniques: Verbal cues,  Tactile cues, Gestural cues  Exercises      General Comments        Pertinent Vitals/Pain Pain Assessment Pain Assessment: Faces Faces Pain Scale: Hurts even more Pain Location: generalized hypersensitivity Pain Descriptors / Indicators: Discomfort, Grimacing, Moaning, Guarding Pain Intervention(s): Monitored during session, Repositioned, Limited activity within patient's tolerance    Home Living                          Prior Function            PT Goals (current goals can now be found in the care plan section) Acute Rehab PT Goals Patient Stated Goal: not stated Progress towards PT goals: Progressing toward goals    Frequency    Min 2X/week      PT Plan      Co-evaluation              AM-PAC PT "6 Clicks" Mobility   Outcome Measure  Help needed turning from your back to your side while in a flat bed without using bedrails?: A Lot Help needed moving from lying on your back to sitting on the side of a flat bed without using bedrails?: Total Help needed moving to and from a bed to a chair (including a wheelchair)?: Total Help needed standing up from a chair using your arms (e.g., wheelchair or bedside chair)?: Total Help needed to walk in hospital room?: Total Help needed climbing 3-5 steps with a railing? : Total 6 Click Score: 7    End of Session Equipment Utilized During Treatment: Gait belt Activity Tolerance: Patient limited by pain;Patient limited by fatigue Patient left: in bed;with call bell/phone within reach;with nursing/sitter in room Nurse Communication: Mobility status PT Visit Diagnosis: Other abnormalities of gait and mobility (R26.89)     Time: 8657-8469 PT Time Calculation (min) (ACUTE ONLY): 25 min  Charges:    $Therapeutic Activity: 23-37 mins PT General Charges $$ ACUTE PT VISIT: 1 Visit                     Ferd Glassing., PT  Office # [phone]    Ilda Foil 08/04/2023, 11:49 AM

## 2023-08-04 NOTE — Plan of Care (Signed)
  Problem: Coping: Goal: Ability to adjust to condition or change in health will improve Outcome: Progressing   Problem: Skin Integrity: Goal: Risk for impaired skin integrity will decrease Outcome: Progressing   Problem: Clinical Measurements: Goal: Will remain free from infection Outcome: Progressing Goal: Respiratory complications will improve Outcome: Progressing   Problem: Activity: Goal: Risk for activity intolerance will decrease Outcome: Progressing   Problem: Skin Integrity: Goal: Risk for impaired skin integrity will decrease Outcome: Progressing

## 2023-08-04 NOTE — Care Management Important Message (Signed)
 Important Message  Patient Details  Name: Kenneth [name] MRN: [id number] Date of Birth: 1950/10/26   Important Message Given:  Yes - Medicare IM     Dorena Bodo 08/04/2023, 1:40 PM

## 2023-08-04 NOTE — Consult Note (Signed)
 [facility] Psychiatric Consult Initial  Patient Name: .Kenneth [name]  MRN: [id number]  DOB: 03/12/1951  Consult Order details:  Orders (From admission, onward)     Start     Ordered   08/04/23 0855  IP CONSULT TO PSYCHIATRY       Ordering Provider: Leroy Sea, MD  Provider:  (Not yet assigned)  Question Answer Comment  Location [facility]   Reason for Consult? Flat affect, poor appetite, generalized deconditioning, per family worsening depression.      08/04/23 0855             Mode of Visit: In person    Psychiatry Consult Evaluation  Service Date: August 04, 2023 LOS:  LOS: 3 days  Chief Complaint "I've not been eating"  Primary Psychiatric Diagnoses  Altered mental status 2.  Depression  Assessment  Kenneth [name] is a 73 y.o. male admitted: Medically on 08/01/2023  6:19 AM for  generalized weakness in a patient with gradually progressive anemia of chronic disease, dehydration, hypomagnesemia.  He has no documented past psychiatric history, but prescribed Lexapro as home medication presumably for depression.  He has a past medical history of  stroke, currently on DAPT, dyslipidemia, BPH, prostate cancer, insulin-dependent DM type II, hypertension, cervical stenosis, history of iron deficiency anemia, history of OSA does not wear CPAP, recent history of right ankle strain 2 weeks ago, presents to the hospital for 2-3 history of gradually progressive weakness, was also not eating or drinking well for a few days.   Met with resident covering neurology service who indicated patient has been diagnosed with a B12 deficiency that may be contributing to his current mental state.  His current presentation of altered mental status is most consistent with her underlying medical condition. He meets criteria for depression based on poor appetite, decreased interest in usual activities, sleeping more than usual, low energy, poor concentration.  Current  outpatient psychotropic medications include Lexapro and historically he has had a minimal response to these medications. He was compliant with medications prior to admission as evidenced by patient and wife report. On initial examination, patient is drowsy during assessment.  He will become alert to response, but is disoriented to time.  He provides permission to contact his wife, but is unable to provide her name or phone number.  Patient states that Lexapro has not seem to help his depressed mood.  He is open to a change in antidepressant and reviewed mirtazapine which can help improve sleep and appetite.  He is agreeable, but would like me to review this with his wife.  Discussed patient diagnoses of problem list of obstructive sleep apnea.  Patient states he has not ever used CPAP, but is open to trial of CPAP hospitalized. Patient specifically denies any SI, HI, AVH.  Please see plan below for detailed recommendations.   Diagnoses:  Active Hospital problems: Principal Problem:   Symptomatic anemia Active Problems:   Weakness   Thrombocytopenia (HCC)   History of stroke    Plan   ## Psychiatric Medication Recommendations:  --Discontinue Lexapro due to ineffectiveness for depression management -- Mirtazapine 7.5 mg for depressed mood with side effects of sedation and increasing appetite. -- Nutritional supplementation as recommended by neurology and primary team   ## Medical Decision Making Capacity:  Not specifically addressed at this encounter.  Patient does not have full orientation at time of this interview.  Wife was at bedside at reassessment and is able to assist  with medical decision making.  ## Further Work-up:  -- EEG being placed at the end of my assessment.  Results pending --Other labs per primary team and neurology. -- most recent EKG on 08/01/2023 had QtC of 469 -- Pertinent labwork reviewed earlier this admission includes: Mild hyponatremia, elevated blood sugars,  normal magnesium and ammonia, normal BNP, CBC with anemia   ## Disposition:-- There are no psychiatric contraindications to discharge at this time  ## Behavioral / Environmental: -Delirium Precautions: Delirium Interventions for Nursing and Staff: - RN to open blinds every AM. - To Bedside: Glasses, hearing aide, and pt's own shoes. Make available to patients. when possible and encourage use. - Encourage po fluids when appropriate, keep fluids within reach. - OOB to chair with meals. - Passive ROM exercises to all extremities with AM & PM care. - RN to assess orientation to person, time and place QAM and PRN. - Recommend extended visitation hours with familiar family/friends as feasible. - Staff to minimize disturbances at night. Turn off television when pt asleep or when not in use.    ## Safety and Observation Level:  - Based on my clinical evaluation, I estimate the patient to be at low risk of self harm in the current setting. - At this time, we recommend  routine. This decision is based on my review of the chart including patient's history and current presentation, interview of the patient, mental status examination, and consideration of suicide risk including evaluating suicidal ideation, plan, intent, suicidal or self-harm behaviors, risk factors, and protective factors. This judgment is based on our ability to directly address suicide risk, implement suicide prevention strategies, and develop a safety plan while the patient is in the clinical setting. Please contact our team if there is a concern that risk level has changed.  CSSR Risk Category:C-SSRS RISK CATEGORY: No Risk  Suicide Risk Assessment: Patient has following modifiable risk factors for suicide: under treated depression , which we are addressing by medication management. Patient has following non-modifiable or demographic risk factors for suicide: male gender Patient has the following protective factors against suicide:  Supportive family, Supportive friends, Cultural, spiritual, or religious beliefs that discourage suicide, no history of suicide attempts, and no history of NSSIB  Thank you for this consult request. Recommendations have been communicated to the primary team.  We will continue to follow at this time.   Mariel Craft, MD       History of Present Illness  Relevant Aspects of  Hospital Course:  Admitted on 08/01/2023 for altered mental status.   Collateral information:  Collateral obtained from wife and caregiver at bedside at time of reassessment in the afternoon on 08/04/2023: Patient was more alert, and patient and wife described him as improved from yesterday and more talkative. Wife states that patient had been becoming depressed for some time prior to admission.  She states he ultimately ended up on Mounjaro and Toujeo for management of his diabetes.  Wife states that he did have significant weight loss, but she feels like it is "short circuited his brain".  She states that after starting [facility] patient became more forgetful and depressed.  Patient has since discontinued Mounjaro and his Toujeo dose has been decreased in half. She states that he had always been a picky eater, but after starting Mounjaro, he has lost his appetite and not regained it since discontinuation.   Patient Report:  Patient has fluctuating attention during assessments.  He specifically denies any SI, HI, AVH.  He does endorse depression.  Psych ROS:  Depression: Yes Anxiety: Yes Mania (lifetime and current): None Psychosis: (lifetime and current): None  Review of Systems  Constitutional:  Positive for malaise/fatigue and weight loss.  Respiratory: Negative.    Neurological:  Positive for weakness.  Psychiatric/Behavioral:  Positive for depression and memory loss. Negative for hallucinations, substance abuse and suicidal ideas. The patient is not nervous/anxious and does not have insomnia.      Psychiatric  and Social History  Psychiatric History:  Information collected from patient and wife  Prev Dx/Sx: Depression Current Psych Provider: None Home Meds (current): Lexapro Previous Med Trials: Lexapro Therapy: None  Prior Psych Hospitalization: None Prior Self Harm: None Prior Violence: None  Family Psych History: None Family Hx suicide: None  Social History:  Developmental Hx: Not obtained Educational Hx: Not obtained Occupational Hx: Retired Armed forces operational officer Hx: None Living Situation: Lives with wife and caregiver Spiritual Hx: Not asked Access to weapons/lethal means: Denies   Social History   Socioeconomic History   Marital status: Married    Spouse name: Not on file   Number of children: Not on file   Years of education: Not on file   Highest education level: Not on file  Occupational History   Not on file  Tobacco Use   Smoking status: Former    Current packs/day: 0.00    Average packs/day: 0.3 packs/day for 2.0 years (0.5 ttl pk-yrs)    Types: Cigarettes    Start date: 09/23/1967    Quit date: 09/22/1969    Years since quitting: 53.9   Smokeless tobacco: Never  Vaping Use   Vaping status: Never Used  Substance and Sexual Activity   Alcohol use: No   Drug use: No   Sexual activity: Yes  Other Topics Concern   Not on file  Social History Narrative   Right handed    Wear glasses   Social Drivers of Health   Financial Resource Strain: Not on file  Food Insecurity: No Food Insecurity (08/01/2023)   Hunger Vital Sign    Worried About Running Out of Food in the Last Year: Never true    Ran Out of Food in the Last Year: Never true  Transportation Needs: No Transportation Needs (08/01/2023)   PRAPARE - Administrator, Civil Service (Medical): No    Lack of Transportation (Non-Medical): No  Physical Activity: Not on file  Stress: Not on file  Social Connections: Socially Isolated (08/01/2023)   Social Connection and Isolation Panel [NHANES]    Frequency of  Communication with Friends and Family: Never    Frequency of Social Gatherings with Friends and Family: Never    Attends Religious Services: Never    Database administrator or Organizations: No    Attends Banker Meetings: Never    Marital Status: Married  Catering manager Violence: Not At Risk (08/01/2023)   Humiliation, Afraid, Rape, and Kick questionnaire    Fear of Current or Ex-Partner: No    Emotionally Abused: No    Physically Abused: No    Sexually Abused: No    Substance History Alcohol: None Type of alcohol N/A Last Drink N/A Number of drinks per day: N/A History of alcohol withdrawal seizures in N/A History of DT's in  Tobacco: Former Illicit drugs: N/A Prescription drug abuse: N/A Rehab hx: N/A  Exam Findings  Physical Exam:  Vital Signs:  Temp:  [97.4 F (36.3 C)-99.5 F (37.5 C)] 97.8 F (36.6 C) (03/25  0800) Pulse Rate:  [64-88] 88 (03/25 0433) Resp:  [14-29] 14 (03/25 0820) BP: (113-125)/(41-58) 125/55 (03/25 0433) SpO2:  [93 %-97 %] 95 % (03/25 0820) FiO2 (%):  [21 %] 21 % (03/25 0820) Blood pressure (!) 125/55, pulse 88, temperature 97.8 F (36.6 C), temperature source Oral, resp. rate 14, height 5\' 11"  (1.803 m), weight 86.6 kg, SpO2 95%. Body mass index is 26.64 kg/m.  Physical Exam  Mental Status Exam: General Appearance: Disheveled  Orientation:  Other:  Person and place  Memory:  Immediate;   Poor Recent;   Poor Remote;   Poor  Concentration:  Concentration: Poor and Attention Span: Poor  Recall:  Poor  Attention  Poor  Eye Contact:  Minimal  Speech:  Garbled  Language:  Fair  Volume:  Decreased  Mood: Depressed  Affect:  Depressed and Flat  Thought Process:  Coherent  Thought Content:  Hallucinations: None  Suicidal Thoughts:  No  Homicidal Thoughts:  No  Judgement:  Impaired  Insight:  Shallow  Psychomotor Activity:  Decreased  Akathisia:  No  Fund of Knowledge:  Fair      Assets:  Desire for  Improvement Resilience Social Support  Cognition:  Impaired,  Moderate  ADL's:  Impaired  AIMS (if indicated):        Other History   These have been pulled in through the EMR, reviewed, and updated if appropriate.  Family History:  The patient's family history includes Diabetes in his mother.  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   History of colon polyps - adenomas 04/03/2010   Hyperlipidemia    Hypertension    Iron deficiency anemia, unspecified    OSA (obstructive sleep apnea)    refusing CPAP   Pneumonia 03/2022   w/effusion   Prostate cancer (HCC)    Stroke [facility])     Surgical History: Past Surgical History:  Procedure Laterality Date   ANTERIOR CERVICAL DECOMP/DISCECTOMY FUSION N/A 10/01/2012   Procedure: ANTERIOR CERVICAL DECOMPRESSION/DISCECTOMY FUSION 2 LEVELS;  Surgeon: Tia Alert, MD;  Location: [facility] NEURO ORS;  Service: Neurosurgery;  Laterality: N/A;  Cervical five-six,Cervical six-seven   COLONOSCOPY  09/30/2010, 03/01/2014   diverticulosis, small internal hemorrhoids   COLONOSCOPY W/ POLYPECTOMY  04/03/2010   4 polyps 8-49mm, worst TV adenoma with high-grade dysplasia   ESOPHAGOGASTRODUODENOSCOPY N/A 08/02/2023   Procedure: EGD (ESOPHAGOGASTRODUODENOSCOPY);  Surgeon: [name], MD;  Location: [facility] ENDOSCOPY;  Service: Gastroenterology;  Laterality: N/A;   LOOP RECORDER INSERTION N/A 10/10/2022   Procedure: LOOP RECORDER INSERTION;  Surgeon: Marinus Maw, MD;  Location: [facility] INVASIVE CV LAB;  Service: Cardiovascular;  Laterality: N/A;   NO PAST SURGERIES     POLYPECTOMY     POSTERIOR CERVICAL FUSION/FORAMINOTOMY Left 10/20/2012   Procedure: C/5-6,C/6-7 Lami/Multi level,POSTERIOR CERVICAL FUSION/FORAMINOTOMY C/4-7;  Surgeon: Tia Alert, MD;   Location: [facility] NEURO ORS;  Service: Neurosurgery;  Laterality: Left;  Cervical Five-Six, Cervical Six-Seven Laminectomies, Posterior Cervical Fusion/Foraminotomies Cervical Four through Seven.    TONSILLECTOMY       Medications:   Current Facility-Administered Medications:    acetaminophen (TYLENOL) tablet 650 mg, 650 mg, Oral, Q6H PRN, 650 mg at 08/04/23 0441 **OR** acetaminophen (TYLENOL) suppository 650 mg, 650 mg, Rectal, Q6H PRN,  Leroy Sea, MD   albuterol (PROVENTIL) (2.5 MG/3ML) 0.083% nebulizer solution 2.5 mg, 2.5 mg, Nebulization, TID, Leroy Sea, MD, 2.5 mg at 08/04/23 1610   aspirin chewable tablet 81 mg, 81 mg, Oral, Daily, Leroy Sea, MD, 81 mg at 08/04/23 1006   atorvastatin (LIPITOR) tablet 40 mg, 40 mg, Oral, QHS, Leroy Sea, MD, 40 mg at 08/03/23 2100   bisacodyl (DULCOLAX) EC tablet 5 mg, 5 mg, Oral, Daily PRN, Leroy Sea, MD   [START ON 08/05/2023] cyanocobalamin (VITAMIN B12) tablet 1,000 mcg, 1,000 mcg, Oral, Daily, Thedore Mins, Stanford Scotland, MD   escitalopram (LEXAPRO) tablet 20 mg, 20 mg, Oral, Daily, Leroy Sea, MD, 20 mg at 08/04/23 1006   ezetimibe (ZETIA) tablet 10 mg, 10 mg, Oral, Daily, Leroy Sea, MD, 10 mg at 08/04/23 1006   ferrous sulfate tablet 325 mg, 325 mg, Oral, BID WC, Leroy Sea, MD, 325 mg at 08/04/23 1006   folic acid (FOLVITE) tablet 1 mg, 1 mg, Oral, Daily, Leroy Sea, MD, 1 mg at 08/04/23 1006   insulin aspart (novoLOG) injection 0-15 Units, 0-15 Units, Subcutaneous, TID WC, Leroy Sea, MD, 3 Units at 08/04/23 1006   insulin aspart (novoLOG) injection 0-5 Units, 0-5 Units, Subcutaneous, QHS, Singh, Stanford Scotland, MD   insulin glargine-yfgn (SEMGLEE) injection 30 Units, 30 Units, Subcutaneous, Q1400, Leroy Sea, MD, 30 Units at 08/03/23 1454   megestrol (MEGACE) tablet 40 mg, 40 mg, Oral, Daily, Thedore Mins, Stanford Scotland, MD   menthol-cetylpyridinium (CEPACOL) lozenge 3 mg, 1 lozenge, Oral, PRN,  Howerter, Justin B, DO   [DISCONTINUED] ondansetron (ZOFRAN) tablet 4 mg, 4 mg, Oral, Q6H PRN **OR** ondansetron (ZOFRAN) injection 4 mg, 4 mg, Intravenous, Q6H PRN, Thedore Mins, Stanford Scotland, MD   pantoprazole (PROTONIX) EC tablet 40 mg, 40 mg, Oral, BID AC, Leroy Sea, MD, 40 mg at 08/04/23 1006   phenol (CHLORASEPTIC) mouth spray 1 spray, 1 spray, Mouth/Throat, PRN, Howerter, Justin B, DO, 1 spray at 08/03/23 0528   tamsulosin (FLOMAX) capsule 0.4 mg, 0.4 mg, Oral, QPM, Susa Raring K, MD, 0.4 mg at 08/03/23 1653   ticagrelor (BRILINTA) tablet 90 mg, 90 mg, Oral, BID, Leroy Sea, MD, 90 mg at 08/04/23 1006  Allergies: Allergies  Allergen Reactions   Jardiance [Empagliflozin] Other (See Comments)    To the hospital- due to complications   Fluoxetine Other (See Comments)    Prefers to NOT take this   Penicillins Other (See Comments)    CHILDHOOD ALLERGY- exact reaction? Has patient had a PCN reaction causing immediate rash, facial/tongue/throat swelling, SOB or lightheadedness with hypotension: Unknown Has patient had a PCN reaction causing severe rash involving mucus membranes or skin necrosis: Unknown Has patient had a PCN reaction that required hospitalization: Unknown Has patient had a PCN reaction occurring within the last 10 years: Unknown If all of the above answers are "NO", then may proceed with Cephalosporin use.     Silodosin Other (See Comments)    Vertigo     Mariel Craft, MD  Total Time Spent in Direct Patient Care:  I personally spent 35 minutes on the unit in direct patient care. The direct patient care time included face-to-face time with the patient, reviewing the patient's chart, communicating with other professionals, and coordinating care. Greater than 50% of this time was spent in counseling or coordinating care with the patient regarding goals of hospitalization, psycho-education, and discharge planning needs.

## 2023-08-04 NOTE — Progress Notes (Signed)
 EEG complete - results pending

## 2023-08-04 NOTE — Procedures (Signed)
 Patient Name: RAYON MCCHRISTIAN  MRN: [id number]  Epilepsy Attending: Charlsie Quest  Referring Physician/Provider: Leroy Sea, MD  Date: 08/04/2023 Duration: 25.23 mins  Patient history: 73 year old male with altered mental status.  EEG to alert for seizure.  Level of alertness: Awake  AEDs during EEG study: None  Technical aspects: This EEG study was done with scalp electrodes positioned according to the 10-20 International system of electrode placement. Electrical activity was reviewed with band pass filter of 1-70Hz , sensitivity of 7 uV/mm, display speed of 40mm/sec with a 60Hz  notched filter applied as appropriate. EEG data were recorded continuously and digitally stored.  Video monitoring was available and reviewed as appropriate.  Description: The posterior dominant rhythm consists of 7 Hz activity of moderate voltage (25-35 uV) seen predominantly in posterior head regions, symmetric and reactive to eye opening and eye closing. EEG showed continuous generalized 6 to 7 Hz theta slowing. Hyperventilation and photic stimulation were not performed.     ABNORMALITY - Continuous slow, generalized  IMPRESSION: This study is suggestive of mild diffuse encephalopathy. No seizures or epileptiform discharges were seen throughout the recording.  Montravious Weigelt Annabelle Harman

## 2023-08-04 NOTE — Hospital Course (Addendum)
 On collateral call with wife Kenneth [name] [phone]): Patient describes gradual decline since January. Patient underwent small strokes. Per wife, no sign of dementia/memory issues. Became depressed since had to sell [employer]. Seeing outpatient psychiatry, on Lexapro. Fine motor skills affect, difficulty playing piano which also affects mood. Has hearing difficulties as well -- deaf since birth, hearing aids help but reads lips. Stuck at home. Day beforee yesterday. No seziure history. Has been eating very poorly. Low B12. Now altered, recently: struggling to name the president. Explained difference between delirium and depression, how low B12 can affect this. Explained workup: EEG, head CT imaging, other testing.

## 2023-08-04 NOTE — TOC Progression Note (Addendum)
 Transition of Care [facility]) - Progression Note    Patient Details  Name: Kenneth [name] MRN: [id number] Date of Birth: 04-May-1951  [facility] CM/SW Contact  Mearl Latin, LCSW Phone Number: 08/04/2023, 11:13 AM  Clinical Narrative:    11am-[facility] has declined referral. Will provide alternative SNF options. Left voicemail for spouse.  4:40 PM-CSW met with spouse and caregiver at bedside and provided SNF offers and Medicare ratings list. Spouse requested CSW send referral to [facility] even though patient did not want to go there. She stated she spent a few months at [facility] and then [facility] (which she hated) and then [facility] where the food was awful but the therapy is good and she has maintained communication with their therapists. She would like to consider [facility] and [facility]. CSW will continue to monitor bed availability and discussed that IR has not been recommended for patient at this time.  Expected Discharge Plan: Skilled Nursing Facility Barriers to Discharge: Continued Medical Work up, SNF Pending bed offer  Expected Discharge Plan and Services In-house Referral: Clinical Social Work   Post Acute Care Choice: Skilled Nursing Facility Living arrangements for the past 2 months: Single Family Home                                       Social Determinants of Health (SDOH) Interventions SDOH Screenings   Food Insecurity: No Food Insecurity (08/01/2023)  Housing: Low Risk  (08/01/2023)  Transportation Needs: No Transportation Needs (08/01/2023)  Utilities: Not At Risk (08/01/2023)  Depression (PHQ2-9): Low Risk  (11/07/2022)  Social Connections: Socially Isolated (08/01/2023)  Tobacco Use: Medium Risk (08/01/2023)    Readmission Risk Interventions     No data to display

## 2023-08-04 NOTE — Consult Note (Addendum)
 NEUROLOGY CONSULT NOTE   Date of service: August 04, 2023 Patient Name: Kenneth [name] MRN:  [id number] DOB:  10-14-1950 Chief Complaint: "AMS" Requesting Provider: Leroy Sea, MD  History of Present Illness  JUSHUA [name] is a 73 y.o. 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 intake, difficulties ambulating, a fall 2 weeks ago.  [facility] to be hypotensive and hypomagnesemic in the emergency department as well as worsening anemia.  Admitted to the hospital medicine service 3/22 for hypotension to 108/46.  Neurology was consulted for a 2-day history of worsening confusion. AMS labs are significant for severe B12 deficiency, began supplementation.  UA unremarkable.  TSH and folate WNL.  On interview, patient has some difficulty hearing, which is apparently his baseline.  On initial interview with resident, believed it was August and could not explain why he was here in the hospital.  Was able to follow some commands "hold up 2 fingers" but is difficult to maintain conversation with.  On reinterview with wife in room, she says that he has trouble remembering the name of the president.  Cannot subtract 7 from 100 (patient is a IT trainer).  Presentation very much reminiscent of delirium.  On collateral call with wife Teddy Spike [phone]): Patient describes gradual decline since January. Patient underwent small strokes. Per wife, no sign of dementia/memory issues. Became depressed since had to sell [employer]. Seeing outpatient psychiatry, on Lexapro. Fine motor skills affect, difficulty playing piano which also affects mood. Has hearing difficulties as well -- deaf since birth, hearing aids help but reads lips. Stuck at home.  Day beforee yesterday. No seziure history. Has been eating very poorly. Low B12. Now altered, recently: struggling to name the president. Explained difference between delirium and depression, how low B12 can affect this. Explained workup: EEG, head CT imaging, other testing.   ROS  Comprehensive ROS performed and pertinent positives documented in HPI   Past History   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   History of colon polyps - adenomas 04/03/2010   Hyperlipidemia    Hypertension    Iron deficiency anemia, unspecified    OSA (obstructive sleep apnea)    refusing CPAP   Pneumonia 03/2022   w/effusion   Prostate cancer (HCC)    Stroke [facility])     Past Surgical History:  Procedure Laterality Date   ANTERIOR CERVICAL DECOMP/DISCECTOMY FUSION N/A 10/01/2012   Procedure: ANTERIOR CERVICAL DECOMPRESSION/DISCECTOMY FUSION 2 LEVELS;  Surgeon: Tia Alert, MD;  Location: [facility] NEURO ORS;  Service: Neurosurgery;  Laterality: N/A;  Cervical five-six,Cervical six-seven   COLONOSCOPY  09/30/2010, 03/01/2014   diverticulosis, small internal hemorrhoids   COLONOSCOPY W/ POLYPECTOMY  04/03/2010   4 polyps 8-74mm, worst TV adenoma with high-grade dysplasia   ESOPHAGOGASTRODUODENOSCOPY N/A 08/02/2023   Procedure: EGD (ESOPHAGOGASTRODUODENOSCOPY);  Surgeon: [name], MD;  Location: [facility] ENDOSCOPY;  Service: Gastroenterology;  Laterality: N/A;   LOOP RECORDER INSERTION N/A 10/10/2022   Procedure: LOOP RECORDER INSERTION;  Surgeon: Marinus Maw, MD;  Location: [facility] INVASIVE CV LAB;  Service: Cardiovascular;  Laterality: N/A;   NO PAST SURGERIES     POLYPECTOMY     POSTERIOR CERVICAL FUSION/FORAMINOTOMY Left 10/20/2012   Procedure: C/5-6,C/6-7 Lami/Multi level,POSTERIOR CERVICAL FUSION/FORAMINOTOMY C/4-7;   Surgeon: Tia Alert, MD;  Location: [facility] NEURO ORS;  Service: Neurosurgery;  Laterality: Left;  Cervical Five-Six, Cervical Six-Seven Laminectomies, Posterior Cervical Fusion/Foraminotomies Cervical Four through Seven.    TONSILLECTOMY      Family History: Family History  Problem Relation Age of Onset   Diabetes Mother    Stomach cancer Neg Hx    Rectal cancer Neg Hx    Pancreatic cancer Neg Hx    Colon cancer Neg Hx    Colon polyps Neg Hx    Esophageal cancer Neg Hx    Prostate cancer Neg Hx    Breast cancer Neg Hx     Social History  reports that he quit smoking about 53 years ago. His smoking use included cigarettes. He started smoking about 55 years ago. He has a 0.5 pack-year smoking history. He has never used smokeless tobacco. He reports that he does not drink alcohol and does not use drugs.  Allergies  Allergen Reactions   Jardiance [Empagliflozin] Other (See Comments)    To the hospital- due to complications   Fluoxetine Other (See Comments)    Prefers to NOT take this   Penicillins Other (See Comments)    CHILDHOOD ALLERGY- exact reaction? Has patient had a PCN reaction causing immediate rash, facial/tongue/throat swelling, SOB or lightheadedness with hypotension: Unknown Has patient had a PCN reaction causing severe rash involving mucus membranes or skin necrosis: Unknown Has patient had a PCN reaction that required hospitalization: Unknown Has patient had a PCN reaction occurring within the last 10 years: Unknown If all of the above answers are "NO", then may proceed with Cephalosporin use.     Silodosin Other (See Comments)    Vertigo     Medications   Current Facility-Administered Medications:    acetaminophen (TYLENOL) tablet 650 mg, 650 mg, Oral, Q6H PRN, 650 mg at 08/04/23 0441 **OR** acetaminophen (TYLENOL) suppository 650 mg, 650 mg, Rectal, Q6H PRN, Thedore Mins, Prashant K, MD   albuterol (PROVENTIL) (2.5 MG/3ML) 0.083% nebulizer solution 2.5 mg, 2.5 mg,  Nebulization, Q6H PRN, Leroy Sea, MD   aspirin chewable tablet 81 mg, 81 mg, Oral, Daily, Susa Raring K, MD, 81 mg at 08/04/23 1006   atorvastatin (LIPITOR) tablet 40 mg, 40 mg, Oral, QHS, Leroy Sea, MD, 40 mg at 08/03/23 2100   bisacodyl (DULCOLAX) EC tablet 5 mg, 5 mg, Oral, Daily PRN, Leroy Sea, MD   [START ON 08/05/2023] cyanocobalamin (VITAMIN B12) tablet 1,000 mcg, 1,000 mcg, Oral, Daily, Thedore Mins, Stanford Scotland, MD   ezetimibe (ZETIA) tablet 10 mg, 10 mg, Oral, Daily, Susa Raring K, MD, 10 mg at 08/04/23 1006   ferrous sulfate tablet 325 mg, 325 mg, Oral, BID WC, Leroy Sea, MD, 325 mg at 08/04/23 1006   folic acid (FOLVITE) tablet 1 mg, 1 mg, Oral, Daily, Susa Raring K, MD, 1 mg at 08/04/23 1006   insulin aspart (novoLOG) injection 0-15 Units, 0-15 Units, Subcutaneous, TID WC, Leroy Sea, MD, 3 Units at 08/04/23 1250   insulin aspart (novoLOG) injection 0-5 Units, 0-5 Units, Subcutaneous, QHS, Singh, Prashant K, MD   insulin glargine-yfgn (SEMGLEE) injection 30 Units, 30 Units, Subcutaneous, Q1400, Leroy Sea, MD, 30 Units at 08/04/23 1250   megestrol (MEGACE) tablet 40  mg, 40 mg, Oral, Daily, Thedore Mins, Stanford Scotland, MD   menthol-cetylpyridinium (CEPACOL) lozenge 3 mg, 1 lozenge, Oral, PRN, Howerter, Justin B, DO   [DISCONTINUED] ondansetron (ZOFRAN) tablet 4 mg, 4 mg, Oral, Q6H PRN **OR** ondansetron (ZOFRAN) injection 4 mg, 4 mg, Intravenous, Q6H PRN, Thedore Mins, Stanford Scotland, MD   pantoprazole (PROTONIX) EC tablet 40 mg, 40 mg, Oral, BID AC, Leroy Sea, MD, 40 mg at 08/04/23 1006   phenol (CHLORASEPTIC) mouth spray 1 spray, 1 spray, Mouth/Throat, PRN, Howerter, Justin B, DO, 1 spray at 2023-08-31 0528   tamsulosin (FLOMAX) capsule 0.4 mg, 0.4 mg, Oral, QPM, Susa Raring K, MD, 0.4 mg at Aug 31, 2023 1653   [START ON 08/05/2023] thiamine (VITAMIN B1) tablet 100 mg, 100 mg, Oral, q AM, Tomie China, MD   ticagrelor [facility]) tablet 90 mg, 90  mg, Oral, BID, Susa Raring K, MD, 90 mg at 08/04/23 1006  Vitals   Vitals:   08/04/23 0433 08/04/23 0800 08/04/23 0820 08/04/23 1200  BP: (!) 125/55     Pulse: 88     Resp: (!) 25  14 19   Temp: 99.5 F (37.5 C) 97.8 F (36.6 C)  (!) 97 F (36.1 C)  TempSrc: Oral Oral  Oral  SpO2: 94%  95%   Weight:      Height:        Body mass index is 26.64 kg/m.  Physical Exam   Constitutional: Confused appearing man laying in hospital bed, alert and attentive to conversation, appears confused Psych: Affect appropriate to situation.  Eyes: No scleral injection.  HENT: No OP obstruction.  Head: Normocephalic.  Cardiovascular: Normal rate and regular rhythm.  Respiratory: Effort normal, non-labored breathing.   Neurologic Examination   Mental Status - Of note, patient is hard of hearing, which may complicate mental status exam.  Patient oriented to self, location, year and variably oriented to month (August, March).  Largely follows verbal commands ("show two fingers"), sometimes requires repetition.  Could not explain why he was in the hospital.  Attention span/concentration limited: Could not spell WORLD ("WOLD" x2).  Could not subtract 7 from 100.  Expressive language intact.  Comprehension largely intact.  Cranial Nerves II - XII - II - Visual field intact. No double vision III, IV, VI - Extraocular movements intact. V - Facial sensation intact bilaterally. VII - Facial movement intact bilaterally. VIII - Hearing intact bilaterally. X - Palate elevates symmetrically. XI - Chin turning & shoulder shrug intact bilaterally. XII - Tongue protrusion intact.  Motor Strength - The patient's strength was normal in all extremities and pronator drift was absent.  Bulk was normal and fasciculations were absent.    Motor Tone - Muscle tone was assessed at the neck and appendages and was normal.  Reflexes - The patient's reflexes were brisk +3 in bilateral patellar, biceps and  brachioradialis.  Sensory - Light touch, temperature/pinprick were assessed: Impaired temperature and light touch sensation in bilateral lower extremities to calf.  Otherwise intact to light touch and temperature.  Coordination - FNF intact.  Gait and Station - deferred.   Labs/Imaging/Neurodiagnostic studies   CBC:  Recent Labs  Lab 2023/08/31 1834 08/04/23 0452  WBC 8.9 8.5  NEUTROABS 5.8 7.2  HGB 9.6* 8.7*  HCT 27.3* 24.7*  MCV 86.7 84.6  PLT 101* 101*   Basic Metabolic Panel:  Lab Results  Component Value Date   NA 130 (L) 08/04/2023   K 3.7 08/04/2023   CO2 24 08/04/2023   GLUCOSE 159 (  H) 08/04/2023   BUN 20 08/04/2023   CREATININE 0.89 08/04/2023   CALCIUM 7.7 (L) 08/04/2023   GFRNONAA >60 08/04/2023   GFRAA >60 07/25/2017   Lipid Panel:  Lab Results  Component Value Date   LDLCALC 47 04/07/2023   HgbA1c:  Lab Results  Component Value Date   HGBA1C 5.3 08/01/2023   Urine Drug Screen:     Component Value Date/Time   LABOPIA NONE DETECTED 04/06/2023 1704   COCAINSCRNUR NONE DETECTED 04/06/2023 1704   LABBENZ NONE DETECTED 04/06/2023 1704   AMPHETMU NONE DETECTED 04/06/2023 1704   THCU NONE DETECTED 04/06/2023 1704   LABBARB NONE DETECTED 04/06/2023 1704    Alcohol Level     Component Value Date/Time   ETH <10 04/06/2023 1739   INR  Lab Results  Component Value Date   INR 1.1 04/06/2023   APTT  Lab Results  Component Value Date   APTT 30 04/06/2023   AED levels: No results found for: "PHENYTOIN", "ZONISAMIDE", "LAMOTRIGINE", "LEVETIRACETA"  AMS labs: Ammonia WNL TSH 1.4 WNL B12 142 low UA unremarkable    Awaiting: RPR Vitamin B1 Vitamin B6 Vitamin B9 Vitamin B12 Vitamin D   CT Head WO:  1. No evidence of an acute intracranial abnormality. 2. 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 3. Known small chronic infarcts within the left  frontal, left parietal and right temporal lobes. 4. Moderate generalized cerebral atrophy. 5. Paranasal sinus disease as described.  MRI Brain WO: Ordered   MRI C-spine WO: Ordered  MRI T-spine WO: Ordered  Neurodiagnostics rEEG:  This study is suggestive of mild diffuse encephalopathy. No seizures or epileptiform discharges were seen throughout the recording.   ASSESSMENT   Kenneth [name] is a 73 y.o. male who presents with reported 1 month history of worsening depressed mood treated with Lexapro in the setting of job loss/status change, appreciation of fine motor skills, normocytic anemia, hypoalbuminemia, and record of following in the setting of very poor p.o. intake and small strokes last year.  Found to have B12 deficiency on admission.  Worsening AMS over the last 2 days.   Believe patient's current symptomatology largely attributable to B12 deficiency as well as element of hospital-induced versus metabolic delirium.  Very possible that depressed mood due to status change as well as being a very picky eater at baseline has led to increasingly poor PO intake as well as current vitaminosis B12.  Wife also attests to this.  CT head showed new small chronic cortical/subcortical infarct in mid right frontal lobe.  Unlikely this is playing a significant role in current symptomatology, but we will follow-up with MRI brain without contrast.  Psychiatry consulted for depressed mood.  Agree with continued B12 supplementation.  We will test for additional deficiencies including: B1, B6, benign, B12, D and as well as RPR.  It is possible patient is taking p.o. B12 at home -- asked in-home carer to double check.  If so, if patient will need regular IM B12 administration.  We will also empirically start thiamine tomorrow morning after labs drawn, in order to avoid interference with this test.  Of note, patient exhibited general hyperreflexia in upper and lower extremities-patient underwent cervical  fusion in 2014. Have ordered repeat MRI C- and T- spine.   RECOMMENDATIONS  - MRI Brain WO contrast ordered for new small frontal lobe infarct - MRI C- and T-spine WO contrast.  - S/p  IM B12 1000 mcg x3 on 3/23 - Start PO Thiamine 100 mg qAM tomorrow - Labs: B1, B6, Folate, B12, vitamin D and RPR. - Continue to monitor hemoglobin and replete as needed - Delirium precautions. - Neurology will continue to follow. ______________________________________________________________________  Sharol Given, MD [facility], PGY-1  I have seen the patient and reviewed the above note.  He has had progressive anemia, difficulty walking and weakness, dementia type symptoms.  His B12 was 102 9 months ago, and was low at 127 multiple years ago as well.  I worry that he has impaired absorption.  He reports that he was taking a supplement, though I am not certain of the reliability of this report.  I have asked his wife to check and see.  With his poor nutrition, I think other causes of nutritional encephalopathy also should be considered and empiric thiamine supplementation should be considered as well as the risk is low.  His hyperreflexia could be baseline, could be related to a nutritional myelopathy, or could be due to compressive myelopathy.  Especially with his history, I think spinal imaging would be prudent.  With an MRI demonstrating a possible new stroke, I also think an MRI of the brain would be prudent as well.  Neurology will continue to follow.  Ritta Slot, MD [facility]   If 7pm- 7am, please page neurology on call as listed in AMION.

## 2023-08-05 ENCOUNTER — Inpatient Hospital Stay (HOSPITAL_COMMUNITY)

## 2023-08-05 DIAGNOSIS — R531 Weakness: Secondary | ICD-10-CM | POA: Diagnosis not present

## 2023-08-05 DIAGNOSIS — D649 Anemia, unspecified: Secondary | ICD-10-CM | POA: Diagnosis not present

## 2023-08-05 DIAGNOSIS — R4182 Altered mental status, unspecified: Secondary | ICD-10-CM | POA: Diagnosis not present

## 2023-08-05 LAB — BRAIN NATRIURETIC PEPTIDE: B Natriuretic Peptide: 41.4 pg/mL (ref 0.0–100.0)

## 2023-08-05 LAB — COMPREHENSIVE METABOLIC PANEL
ALT: 123 U/L — ABNORMAL HIGH (ref 0–44)
AST: 123 U/L — ABNORMAL HIGH (ref 15–41)
Albumin: 2.2 g/dL — ABNORMAL LOW (ref 3.5–5.0)
Alkaline Phosphatase: 91 U/L (ref 38–126)
Anion gap: 8 (ref 5–15)
BUN: 29 mg/dL — ABNORMAL HIGH (ref 8–23)
CO2: 23 mmol/L (ref 22–32)
Calcium: 8 mg/dL — ABNORMAL LOW (ref 8.9–10.3)
Chloride: 101 mmol/L (ref 98–111)
Creatinine, Ser: 0.88 mg/dL (ref 0.61–1.24)
GFR, Estimated: >60 mL/min (ref >60)
Glucose, Bld: 136 mg/dL — ABNORMAL HIGH (ref 70–99)
Potassium: 3.5 mmol/L (ref 3.5–5.1)
Sodium: 132 mmol/L — ABNORMAL LOW (ref 135–145)
Total Bilirubin: 1.5 mg/dL — ABNORMAL HIGH (ref 0.0–1.2)
Total Protein: 4.7 g/dL — ABNORMAL LOW (ref 6.5–8.1)

## 2023-08-05 LAB — CBC WITH DIFFERENTIAL/PLATELET
Abs Immature Granulocytes: 0.05 10*3/uL (ref 0.00–0.07)
Basophils Absolute: 0 10*3/uL (ref 0.0–0.1)
Basophils Relative: 0 %
Eosinophils Absolute: 0 10*3/uL (ref 0.0–0.5)
Eosinophils Relative: 0 %
HCT: 26.3 % — ABNORMAL LOW (ref 39.0–52.0)
Hemoglobin: 9 g/dL — ABNORMAL LOW (ref 13.0–17.0)
Immature Granulocytes: 1 %
Lymphocytes Relative: 10 %
Lymphs Abs: 0.9 10*3/uL (ref 0.7–4.0)
MCH: 29.6 pg (ref 26.0–34.0)
MCHC: 34.2 g/dL (ref 30.0–36.0)
MCV: 86.5 fL (ref 80.0–100.0)
Monocytes Absolute: 2.4 10*3/uL — ABNORMAL HIGH (ref 0.1–1.0)
Monocytes Relative: 26 %
Neutro Abs: 5.6 10*3/uL (ref 1.7–7.7)
Neutrophils Relative %: 63 %
Platelets: 97 10*3/uL — ABNORMAL LOW (ref 150–400)
RBC: 3.04 MIL/uL — ABNORMAL LOW (ref 4.22–5.81)
RDW: 15.3 % (ref 11.5–15.5)
WBC: 9 10*3/uL (ref 4.0–10.5)
nRBC: 0 % (ref 0.0–0.2)

## 2023-08-05 LAB — GLUCOSE, CAPILLARY
Glucose-Capillary: 146 mg/dL — ABNORMAL HIGH (ref 70–99)
Glucose-Capillary: 154 mg/dL — ABNORMAL HIGH (ref 70–99)
Glucose-Capillary: 202 mg/dL — ABNORMAL HIGH (ref 70–99)
Glucose-Capillary: 158 mg/dL — ABNORMAL HIGH (ref 70–99)

## 2023-08-05 LAB — RPR: RPR Ser Ql: NONREACTIVE

## 2023-08-05 LAB — AMMONIA: Ammonia: 16 umol/L (ref 9–35)

## 2023-08-05 LAB — MAGNESIUM: Magnesium: 1.8 mg/dL (ref 1.7–2.4)

## 2023-08-05 LAB — PHOSPHORUS: Phosphorus: 3.9 mg/dL (ref 2.5–4.6)

## 2023-08-05 LAB — VITAMIN D 25 HYDROXY (VIT D DEFICIENCY, FRACTURES): Vit D, 25-Hydroxy: 51.64 ng/mL (ref 30–100)

## 2023-08-05 MED ORDER — STROKE: EARLY STAGES OF RECOVERY BOOK
Freq: Once | Status: AC
  Filled 2023-08-05: qty 1

## 2023-08-05 MED ORDER — LACTATED RINGERS IV SOLN: INTRAVENOUS | Status: AC

## 2023-08-05 MED ORDER — MIRTAZAPINE 15 MG PO TABS
7.5000 mg | ORAL_TABLET | Freq: Every day | ORAL | Status: DC
  Administered 2023-08-06: 7.5 mg via ORAL
  Filled 2023-08-05 (×2): qty 1

## 2023-08-05 NOTE — Plan of Care (Signed)
 Problem: Education: Goal: Ability to describe self-care measures that may prevent or decrease complications (Diabetes Survival Skills Education) will improve 08/05/2023 1349 by Balinda Quails, RN Outcome: Progressing 08/05/2023 1139 by Balinda Quails, RN Outcome: Progressing Goal: Individualized Educational Video(s) 08/05/2023 1349 by Balinda Quails, RN Outcome: Progressing 08/05/2023 1139 by Balinda Quails, RN Outcome: Progressing   Problem: Coping: Goal: Ability to adjust to condition or change in health will improve 08/05/2023 1349 by Balinda Quails, RN Outcome: Progressing 08/05/2023 1139 by Balinda Quails, RN Outcome: Progressing   Problem: Fluid Volume: Goal: Ability to maintain a balanced intake and output will improve 08/05/2023 1349 by Balinda Quails, RN Outcome: Progressing 08/05/2023 1139 by Balinda Quails, RN Outcome: Progressing   Problem: Health Behavior/Discharge Planning: Goal: Ability to identify and utilize available resources and services will improve 08/05/2023 1349 by Balinda Quails, RN Outcome: Progressing 08/05/2023 1139 by Balinda Quails, RN Outcome: Progressing Goal: Ability to manage health-related needs will improve 08/05/2023 1349 by Balinda Quails, RN Outcome: Progressing 08/05/2023 1139 by Balinda Quails, RN Outcome: Progressing   Problem: Metabolic: Goal: Ability to maintain appropriate glucose levels will improve 08/05/2023 1349 by Balinda Quails, RN Outcome: Progressing 08/05/2023 1139 by Balinda Quails, RN Outcome: Progressing   Problem: Nutritional: Goal: Maintenance of adequate nutrition will improve 08/05/2023 1349 by Balinda Quails, RN Outcome: Progressing 08/05/2023 1139 by Balinda Quails, RN Outcome: Progressing Goal: Progress toward achieving an optimal weight will improve 08/05/2023 1349 by Balinda Quails, RN Outcome: Progressing 08/05/2023 1139 by Balinda Quails, RN Outcome:  Progressing   Problem: Skin Integrity: Goal: Risk for impaired skin integrity will decrease 08/05/2023 1349 by Balinda Quails, RN Outcome: Progressing 08/05/2023 1139 by Balinda Quails, RN Outcome: Progressing   Problem: Tissue Perfusion: Goal: Adequacy of tissue perfusion will improve 08/05/2023 1349 by Balinda Quails, RN Outcome: Progressing 08/05/2023 1139 by Balinda Quails, RN Outcome: Progressing   Problem: Education: Goal: Knowledge of General Education information will improve Description: Including pain rating scale, medication(s)/side effects and non-pharmacologic comfort measures 08/05/2023 1349 by Balinda Quails, RN Outcome: Progressing 08/05/2023 1139 by Balinda Quails, RN Outcome: Progressing   Problem: Health Behavior/Discharge Planning: Goal: Ability to manage health-related needs will improve 08/05/2023 1349 by Balinda Quails, RN Outcome: Progressing 08/05/2023 1139 by Balinda Quails, RN Outcome: Progressing   Problem: Clinical Measurements: Goal: Ability to maintain clinical measurements within normal limits will improve 08/05/2023 1349 by Balinda Quails, RN Outcome: Progressing 08/05/2023 1139 by Balinda Quails, RN Outcome: Progressing Goal: Will remain free from infection 08/05/2023 1349 by Balinda Quails, RN Outcome: Progressing 08/05/2023 1139 by Balinda Quails, RN Outcome: Progressing Goal: Diagnostic test results will improve 08/05/2023 1349 by Balinda Quails, RN Outcome: Progressing 08/05/2023 1139 by Balinda Quails, RN Outcome: Progressing Goal: Respiratory complications will improve 08/05/2023 1349 by Balinda Quails, RN Outcome: Progressing 08/05/2023 1139 by Balinda Quails, RN Outcome: Progressing Goal: Cardiovascular complication will be avoided 08/05/2023 1349 by Balinda Quails, RN Outcome: Progressing 08/05/2023 1139 by Balinda Quails, RN Outcome: Progressing   Problem: Activity: Goal: Risk  for activity intolerance will decrease 08/05/2023 1349 by Balinda Quails, RN Outcome: Progressing 08/05/2023 1139 by Balinda Quails, RN Outcome: Progressing   Problem: Nutrition: Goal: Adequate nutrition will be maintained 08/05/2023 1349 by Balinda Quails, RN Outcome: Progressing 08/05/2023 1139 by Balinda Quails, RN Outcome: Progressing  Problem: Coping: Goal: Level of anxiety will decrease 08/05/2023 1349 by Balinda Quails, RN Outcome: Progressing 08/05/2023 1139 by Balinda Quails, RN Outcome: Progressing   Problem: Elimination: Goal: Will not experience complications related to bowel motility 08/05/2023 1349 by Balinda Quails, RN Outcome: Progressing 08/05/2023 1139 by Balinda Quails, RN Outcome: Progressing Goal: Will not experience complications related to urinary retention 08/05/2023 1349 by Balinda Quails, RN Outcome: Progressing 08/05/2023 1139 by Balinda Quails, RN Outcome: Progressing   Problem: Pain Managment: Goal: General experience of comfort will improve and/or be controlled 08/05/2023 1349 by Balinda Quails, RN Outcome: Progressing 08/05/2023 1139 by Balinda Quails, RN Outcome: Progressing   Problem: Safety: Goal: Ability to remain free from injury will improve 08/05/2023 1349 by Balinda Quails, RN Outcome: Progressing 08/05/2023 1139 by Balinda Quails, RN Outcome: Progressing   Problem: Skin Integrity: Goal: Risk for impaired skin integrity will decrease 08/05/2023 1349 by Balinda Quails, RN Outcome: Progressing 08/05/2023 1139 by Balinda Quails, RN Outcome: Progressing

## 2023-08-05 NOTE — Evaluation (Signed)
 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   History of colon polyps - adenomas 04/03/2010   Hyperlipidemia    Hypertension    Iron deficiency anemia, unspecified    OSA (obstructive sleep apnea)    refusing CPAP   Pneumonia 03/2022   w/effusion   Prostate cancer (HCC)    Stroke [facility])    Past Surgical History:  Past Surgical History:  Procedure Laterality Date   ANTERIOR CERVICAL DECOMP/DISCECTOMY FUSION N/A 10/01/2012   Procedure: ANTERIOR CERVICAL DECOMPRESSION/DISCECTOMY FUSION 2 LEVELS;  Surgeon: Tia Alert, MD;  Location: [facility] NEURO ORS;  Service: Neurosurgery;  Laterality: N/A;  Cervical five-six,Cervical six-seven   COLONOSCOPY  09/30/2010, 03/01/2014   diverticulosis, small internal hemorrhoids   COLONOSCOPY W/ POLYPECTOMY  04/03/2010   4 polyps 8-37mm, worst TV adenoma with high-grade dysplasia   ESOPHAGOGASTRODUODENOSCOPY N/A 08/02/2023   Procedure: EGD (ESOPHAGOGASTRODUODENOSCOPY);  Surgeon: [name], MD;  Location[facility] ENDOSCOPY;  Service: Gastroenterology;  Laterality: N/A;   LOOP RECORDER INSERTION N/A 10/10/2022   Procedure: LOOP RECORDER INSERTION;  Surgeon: Marinus Maw, MD;  Location: [facility] INVASIVE CV LAB;  Service: Cardiovascular;  Laterality: N/A;   NO PAST SURGERIES     POLYPECTOMY     POSTERIOR CERVICAL FUSION/FORAMINOTOMY Left 10/20/2012   Procedure: C/5-6,C/6-7 Lami/Multi level,POSTERIOR CERVICAL FUSION/FORAMINOTOMY C/4-7;  Surgeon: Tia Alert, MD;  Location: [facility] NEURO ORS;   Service: Neurosurgery;  Laterality: Left;  Cervical Five-Six, Cervical Six-Seven Laminectomies, Posterior Cervical Fusion/Foraminotomies Cervical Four through Seven.    TONSILLECTOMY     HPI:  Wing Schoch is a 73 y.o. male presents to the hospital for 2-3 history of gradually progressive weakness, was also not eating or drinking well for a few days. Past medical history of stroke, currently on DAPT, dyslipidemia, BPH, prostate cancer, insulin-dependent DM type II, hypertension, cervical stenosis, history of iron deficiency anemia, history of OSA does not wear CPAP, recent history of right ankle strain 2 weeks ago. CT - negative MRI - small acute to subacute infarcts L frontal and parietal lobes and R temporal white matter    Assessment / Plan / Recommendation  Clinical Impression   Pt presents with a grossly functional oropharyngeal swallow function per clinical swallow assessment completed today. Oral prep and transit prompt with complete oral clearance. Pharyngeal swallow initiation appeared timely with laryngeal elevation noted. No overt or subtle s/s of aspiration observed.   Pt did appear to be in pain or discomfort but was unable to verbalize location of pain or express needs. RN updated. Pt's verbal communication was minimal, likely in the setting of his discomfort. Minimal speech production was clear and intelligible.   Recommend continue regular consistency diet and thin liquids as tolerated. PO meds as tolerated. Pt is able to feed himself, though may benefit from meal tray set up and encouragement to eat/drink.   No acute SLP needs identified for swallowing at this time. SLP will sign off. Re-consult if pt exhibits concerns for communication or cognitive deficits  once pain is controlled and pt is able to participate in cognitive-linguistic assessment. SLP does not have orders for cognitive-linguistic assessment at this time.   SLP Visit Diagnosis:  (r/o dysphagia)    Aspiration Risk   No limitations    Diet Recommendation Regular;Thin liquid    Medication Administration: Whole meds with liquid Supervision: Patient able to self feed (may need set up and encouragement to eat) Compensations: Slow rate;Small sips/bites Postural Changes: Seated upright at 90 degrees    Other  Recommendations Oral Care Recommendations: Oral care BID    Recommendations for follow up therapy are one component of a multi-disciplinary discharge planning process, led by the attending physician.  Recommendations may be updated based on patient status, additional functional criteria and insurance authorization.  Follow up Recommendations Follow physician's recommendations for discharge plan and follow up therapies      Assistance Recommended at Discharge  Defer to PT/OT recommendations  Functional Status Assessment Patient has not had a recent decline in their functional status (pertaining to swallow function)         Swallow Study   General Date of Onset: 08/01/23 HPI: Urban Naval is a 73 y.o. male presents to the hospital for 2-3 history of gradually progressive weakness, was also not eating or drinking well for a few days. Past medical history of stroke, currently on DAPT, dyslipidemia, BPH, prostate cancer, insulin-dependent DM type II, hypertension, cervical stenosis, history of iron deficiency anemia, history of OSA does not wear CPAP, recent history of right ankle strain 2 weeks ago. CT - negative MRI - small acute to subacute infarcts L frontal and parietal lobes and R temporal white matter Previous Swallow Assessment: Clinical swallow assessment on 10/09/22 WNL Diet Prior to this Study: Regular;Thin liquids (Level 0) Temperature Spikes Noted: No Respiratory Status: Room air History of Recent Intubation: No Behavior/Cognition: Alert;Cooperative;Distractible (appears to be in pain) Oral Cavity Assessment: Within Functional Limits Oral Cavity - Dentition: Adequate natural  dentition Self-Feeding Abilities: Needs set up (needed encouragement or prompting to initiate intake) Patient Positioning: Upright in bed Baseline Vocal Quality: Normal Volitional Swallow: Able to elicit    Oral/Motor/Sensory Function Overall Oral Motor/Sensory Function: Within functional limits (assessment limited to observation during PO trials. Pt did not participate in formal OME)   Ice Chips Ice chips: Not tested   Thin Liquid Thin Liquid: Within functional limits Presentation: Self Fed;Straw    Nectar Thick Nectar Thick Liquid: Not tested   Honey Thick Honey Thick Liquid: Not tested   Puree Puree: Within functional limits Presentation: Self Fed   Solid     Solid: Within functional limits Presentation: Self Fed      Ellery Plunk 08/05/2023,8:28 AM

## 2023-08-05 NOTE — Plan of Care (Signed)
  Problem: Coping: Goal: Ability to adjust to condition or change in health will improve Outcome: Progressing   Problem: Skin Integrity: Goal: Risk for impaired skin integrity will decrease Outcome: Progressing   Problem: Clinical Measurements: Goal: Will remain free from infection Outcome: Progressing   Problem: Pain Managment: Goal: General experience of comfort will improve and/or be controlled Outcome: Progressing   Problem: Safety: Goal: Ability to remain free from injury will improve Outcome: Progressing

## 2023-08-05 NOTE — Consult Note (Signed)
 Reason for Consult:cervical stenosis Referring Physician: Klye, Besecker is an 73 y.o. male.  HPI: who presented to [facility] on 08/04/2023 with recent history of overall weakness, and increasing mental difficulties. According to his wife only 10 days ago he recounted a tax return from one year ago while assisting a Neurosurgeon with this years return. His rapid decline is being investigated. As part of the evaluation a cervical spine mri was performed. This revealed spinal stenosis at C3/4 with altered cord signal.  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   History of colon polyps - adenomas 04/03/2010   Hyperlipidemia    Hypertension    Iron deficiency anemia, unspecified    OSA (obstructive sleep apnea)    refusing CPAP   Pneumonia 03/2022   w/effusion   Prostate cancer (HCC)    Stroke [facility])     Past Surgical History:  Procedure Laterality Date   ANTERIOR CERVICAL DECOMP/DISCECTOMY FUSION N/A 10/01/2012   Procedure: ANTERIOR CERVICAL DECOMPRESSION/DISCECTOMY FUSION 2 LEVELS;  Surgeon: Tia Alert, MD;  Location: [facility] NEURO ORS;  Service: Neurosurgery;  Laterality: N/A;  Cervical five-six,Cervical six-seven   COLONOSCOPY  09/30/2010, 03/01/2014   diverticulosis, small internal hemorrhoids   COLONOSCOPY W/ POLYPECTOMY  04/03/2010   4 polyps 8-34mm, worst TV adenoma with high-grade dysplasia   ESOPHAGOGASTRODUODENOSCOPY N/A 08/02/2023   Procedure: EGD (ESOPHAGOGASTRODUODENOSCOPY);  Surgeon: [name], MD;  Location: [facility] ENDOSCOPY;  Service: Gastroenterology;  Laterality: N/A;   LOOP RECORDER INSERTION N/A 10/10/2022   Procedure: LOOP RECORDER INSERTION;  Surgeon: Marinus Maw, MD;  Location: [facility] INVASIVE CV LAB;  Service: Cardiovascular;  Laterality: N/A;   NO PAST SURGERIES      POLYPECTOMY     POSTERIOR CERVICAL FUSION/FORAMINOTOMY Left 10/20/2012   Procedure: C/5-6,C/6-7 Lami/Multi level,POSTERIOR CERVICAL FUSION/FORAMINOTOMY C/4-7;  Surgeon: Tia Alert, MD;  Location: [facility] NEURO ORS;  Service: Neurosurgery;  Laterality: Left;  Cervical Five-Six, Cervical Six-Seven Laminectomies, Posterior Cervical Fusion/Foraminotomies Cervical Four through Seven.    TONSILLECTOMY      Family History  Problem Relation Age of Onset   Diabetes Mother    Stomach cancer Neg Hx    Rectal cancer Neg Hx    Pancreatic cancer Neg Hx    Colon cancer Neg Hx    Colon polyps Neg Hx    Esophageal cancer Neg Hx    Prostate cancer Neg Hx    Breast cancer Neg Hx     Social History:  reports that he quit smoking about 53 years ago. His smoking use included cigarettes. He started smoking about 55 years ago. He has a 0.5 pack-year smoking history. He has never used smokeless tobacco. He reports that he does not drink alcohol and does not use drugs.  Allergies:  Allergies  Allergen Reactions   Jardiance [Empagliflozin] Other (See Comments)    To the hospital- due to complications   Fluoxetine Other (See Comments)    Prefers to NOT take this   Penicillins Other (See Comments)    CHILDHOOD ALLERGY- exact reaction? Has patient had a PCN reaction causing immediate rash, facial/tongue/throat swelling, SOB or lightheadedness with hypotension: Unknown Has patient had a PCN reaction causing severe rash involving mucus membranes or skin necrosis: Unknown Has  patient had a PCN reaction that required hospitalization: Unknown Has patient had a PCN reaction occurring within the last 10 years: Unknown If all of the above answers are "NO", then may proceed with Cephalosporin use.     Silodosin Other (See Comments)    Vertigo     Medications: I have reviewed the patient's current medications.  Results for orders placed or performed during the hospital encounter of 08/01/23 (from the past 48  hours)  Glucose, capillary     Status: Abnormal   Collection Time: 08/03/23  4:46 PM  Result Value Ref Range   Glucose-Capillary 193 (H) 70 - 99 mg/dL    Comment: Glucose reference range applies only to samples taken after fasting for at least 8 hours.  CBC with Differential/Platelet     Status: Abnormal   Collection Time: 08/03/23  6:34 PM  Result Value Ref Range   WBC 8.9 4.0 - 10.5 K/uL   RBC 3.15 (L) 4.22 - 5.81 MIL/uL   Hemoglobin 9.6 (L) 13.0 - 17.0 g/dL   HCT 16.1 (L) 09.6 - 04.5 %   MCV 86.7 80.0 - 100.0 fL   MCH 30.5 26.0 - 34.0 pg   MCHC 35.2 30.0 - 36.0 g/dL   RDW 40.9 81.1 - 91.4 %   Platelets 101 (L) 150 - 400 K/uL    Comment: SPECIMEN CHECKED FOR CLOTS REPEATED TO VERIFY    nRBC 0.0 0.0 - 0.2 %   Neutrophils Relative % 65 %   Neutro Abs 5.8 1.7 - 7.7 K/uL   Lymphocytes Relative 12 %   Lymphs Abs 1.0 0.7 - 4.0 K/uL   Monocytes Relative 22 %   Monocytes Absolute 1.9 (H) 0.1 - 1.0 K/uL   Eosinophils Relative 0 %   Eosinophils Absolute 0.0 0.0 - 0.5 K/uL   Basophils Relative 0 %   Basophils Absolute 0.0 0.0 - 0.1 K/uL   Immature Granulocytes 1 %   Abs Immature Granulocytes 0.06 0.00 - 0.07 K/uL    Comment: Performed at [facility], [address], [place], [place] [address]  Glucose, capillary     Status: Abnormal   Collection Time: 08/03/23  9:17 PM  Result Value Ref Range   Glucose-Capillary 165 (H) 70 - 99 mg/dL    Comment: Glucose reference range applies only to samples taken after fasting for at least 8 hours.  Magnesium     Status: None   Collection Time: 08/04/23  4:52 AM  Result Value Ref Range   Magnesium 1.9 1.7 - 2.4 mg/dL    Comment: Performed at [facility], [address] [place], [place] [address]  CBC with Differential/Platelet     Status: Abnormal   Collection Time: 08/04/23  4:52 AM  Result Value Ref Range   WBC 8.5 4.0 - 10.5 K/uL   RBC 2.92 (L) 4.22 - 5.81 MIL/uL   Hemoglobin 8.7 (L) 13.0 - 17.0 g/dL   HCT 86.5 (L) 78.4 -  52.0 %   MCV 84.6 80.0 - 100.0 fL   MCH 29.8 26.0 - 34.0 pg   MCHC 35.2 30.0 - 36.0 g/dL   RDW 69.6 29.5 - 28.4 %   Platelets 101 (L) 150 - 400 K/uL    Comment: Immature Platelet Fraction may be clinically indicated, consider ordering this additional test [id number] REPEATED TO VERIFY    nRBC 0.0 0.0 - 0.2 %   Neutrophils Relative % 85 %   Neutro Abs 7.2 1.7 - 7.7 K/uL  Lymphocytes Relative 6 %   Lymphs Abs 0.5 (L) 0.7 - 4.0 K/uL   Monocytes Relative 8 %   Monocytes Absolute 0.7 0.1 - 1.0 K/uL   Eosinophils Relative 0 %   Eosinophils Absolute 0.0 0.0 - 0.5 K/uL   Basophils Relative 1 %   Basophils Absolute 0.1 0.0 - 0.1 K/uL   WBC Morphology See Note     Comment: Morphology unremarkable   RBC Morphology See Note     Comment: Morphology unremarkable   Smear Review See Note     Comment: Morphology unremarkable   nRBC 0 0 /100 WBC   Abs Immature Granulocytes 0.00 0.00 - 0.07 K/uL    Comment: Performed at [facility], [address], [place], [place] [address]  Brain natriuretic peptide     Status: None   Collection Time: 08/04/23  4:52 AM  Result Value Ref Range   B Natriuretic Peptide 27.8 0.0 - 100.0 pg/mL    Comment: Performed at [facility], [address] [place], [place] [address]  Comprehensive metabolic panel     Status: Abnormal   Collection Time: 08/04/23  4:52 AM  Result Value Ref Range   Sodium 130 (L) 135 - 145 mmol/L   Potassium 3.7 3.5 - 5.1 mmol/L   Chloride 101 98 - 111 mmol/L   CO2 24 22 - 32 mmol/L   Glucose, Bld 159 (H) 70 - 99 mg/dL    Comment: Glucose reference range applies only to samples taken after fasting for at least 8 hours.   BUN 20 8 - 23 mg/dL   Creatinine, Ser 7.56 0.61 - 1.24 mg/dL   Calcium 7.7 (L) 8.9 - 10.3 mg/dL   Total Protein 4.6 (L) 6.5 - 8.1 g/dL   Albumin 2.2 (L) 3.5 - 5.0 g/dL   AST 47 (H) 15 - 41 U/L   ALT 40 0 - 44 U/L   Alkaline Phosphatase 60 38 - 126 U/L   Total Bilirubin 1.3 (H) 0.0 - 1.2 mg/dL   GFR,  Estimated >43 >32 mL/min    Comment: (NOTE) Calculated using the CKD-EPI Creatinine Equation (2021)    Anion gap 5 5 - 15    Comment: Performed at [facility], [address], [place], [place] [address]  Phosphorus     Status: None   Collection Time: 08/04/23  4:52 AM  Result Value Ref Range   Phosphorus 3.8 2.5 - 4.6 mg/dL    Comment: Performed at [facility], [address] [place], [place] [address]  Technologist smear review     Status: None   Collection Time: 08/04/23  4:52 AM  Result Value Ref Range   WBC MORPHOLOGY MORPHOLOGY UNREMARKABLE    RBC MORPHOLOGY MORPHOLOGY UNREMARKABLE    Plt Morphology MORPHOLOGY UNREMARKABLE    Clinical Information anemia     Comment: Performed at [facility], [address], [place], [place] [address]  Glucose, capillary     Status: Abnormal   Collection Time: 08/04/23  6:30 AM  Result Value Ref Range   Glucose-Capillary 142 (H) 70 - 99 mg/dL    Comment: Glucose reference range applies only to samples taken after fasting for at least 8 hours.  Blood gas, arterial     Status: Abnormal   Collection Time: 08/04/23  8:25 AM  Result Value Ref Range   pH, Arterial 7.47 (H) 7.35 - 7.45   pCO2 arterial 37 32 - 48 mmHg  pO2, Arterial 128 (H) 83 - 108 mmHg   Bicarbonate 26.8 20.0 - 28.0 mmol/L   Acid-Base Excess 3.4 (H) 0.0 - 2.0 mmol/L   O2 Saturation 100 %   Patient temperature 37.5    Collection site LEFT RADIAL    Drawn by [id number]    Allens test (pass/fail) PASS PASS    Comment: Performed at [facility], [address] [place], [place] [address]  Ammonia     Status: None   Collection Time: 08/04/23  8:45 AM  Result Value Ref Range   Ammonia 17 9 - 35 umol/L    Comment: Performed at [facility], [address], [place], [place] [address]  Glucose, capillary     Status: Abnormal   Collection Time: 08/04/23  9:57 AM  Result Value Ref Range   Glucose-Capillary 166 (H) 70 - 99 mg/dL    Comment: Glucose  reference range applies only to samples taken after fasting for at least 8 hours.  Glucose, capillary     Status: Abnormal   Collection Time: 08/04/23 12:46 PM  Result Value Ref Range   Glucose-Capillary 169 (H) 70 - 99 mg/dL    Comment: Glucose reference range applies only to samples taken after fasting for at least 8 hours.  Glucose, capillary     Status: Abnormal   Collection Time: 08/04/23  5:05 PM  Result Value Ref Range   Glucose-Capillary 157 (H) 70 - 99 mg/dL    Comment: Glucose reference range applies only to samples taken after fasting for at least 8 hours.  Glucose, capillary     Status: Abnormal   Collection Time: 08/04/23  9:14 PM  Result Value Ref Range   Glucose-Capillary 173 (H) 70 - 99 mg/dL    Comment: Glucose reference range applies only to samples taken after fasting for at least 8 hours.  RPR     Status: None   Collection Time: 08/05/23  4:57 AM  Result Value Ref Range   RPR Ser Ql NON REACTIVE NON REACTIVE    Comment: Performed at [facility], [address] [place], [place] [address]  VITAMIN D 25 Hydroxy (Vit-D Deficiency, Fractures)     Status: None   Collection Time: 08/05/23  4:57 AM  Result Value Ref Range   Vit D, 25-Hydroxy 51.64 30 - 100 ng/mL    Comment: (NOTE) Vitamin D deficiency has been defined by the Institute of Medicine  and an Endocrine Society practice guideline as a level of serum 25-OH  vitamin D less than 20 ng/mL (1,2). The Endocrine Society went on to  further define vitamin D insufficiency as a level between 21 and 29  ng/mL (2).  1. IOM (Institute of Medicine). 2010. Dietary reference intakes for  calcium and D. [place]: [employer]. 2. Holick MF, Binkley Dent, Bischoff-Ferrari HA, et al. Evaluation,  treatment, and prevention of vitamin D deficiency: an Endocrine  Society clinical practice guideline, JCEM. 2011 Jul; 96(7): 1911-30.  Performed at [facility], [address], [place],  [place] [address]   Magnesium     Status: None   Collection Time: 08/05/23  4:57 AM  Result Value Ref Range   Magnesium 1.8 1.7 - 2.4 mg/dL    Comment: Performed at [facility], [address], [place], [place] [address]  CBC with Differential/Platelet     Status: Abnormal   Collection Time: 08/05/23  4:57 AM  Result Value Ref Range  WBC 9.0 4.0 - 10.5 K/uL   RBC 3.04 (L) 4.22 - 5.81 MIL/uL   Hemoglobin 9.0 (L) 13.0 - 17.0 g/dL   HCT 40.9 (L) 81.1 - 91.4 %   MCV 86.5 80.0 - 100.0 fL   MCH 29.6 26.0 - 34.0 pg   MCHC 34.2 30.0 - 36.0 g/dL   RDW 78.2 95.6 - 21.3 %   Platelets 97 (L) 150 - 400 K/uL    Comment: SPECIMEN CHECKED FOR CLOTS Immature Platelet Fraction may be clinically indicated, consider ordering this additional test [id number] REPEATED TO VERIFY    nRBC 0.0 0.0 - 0.2 %   Neutrophils Relative % 63 %   Neutro Abs 5.6 1.7 - 7.7 K/uL   Lymphocytes Relative 10 %   Lymphs Abs 0.9 0.7 - 4.0 K/uL   Monocytes Relative 26 %   Monocytes Absolute 2.4 (H) 0.1 - 1.0 K/uL   Eosinophils Relative 0 %   Eosinophils Absolute 0.0 0.0 - 0.5 K/uL   Basophils Relative 0 %   Basophils Absolute 0.0 0.0 - 0.1 K/uL   Immature Granulocytes 1 %   Abs Immature Granulocytes 0.05 0.00 - 0.07 K/uL    Comment: Performed at [facility], [address], [place], [place] [address]  Comprehensive metabolic panel     Status: Abnormal   Collection Time: 08/05/23  4:57 AM  Result Value Ref Range   Sodium 132 (L) 135 - 145 mmol/L   Potassium 3.5 3.5 - 5.1 mmol/L   Chloride 101 98 - 111 mmol/L   CO2 23 22 - 32 mmol/L   Glucose, Bld 136 (H) 70 - 99 mg/dL    Comment: Glucose reference range applies only to samples taken after fasting for at least 8 hours.   BUN 29 (H) 8 - 23 mg/dL   Creatinine, Ser 2.84 0.61 - 1.24 mg/dL   Calcium 8.0 (L) 8.9 - 10.3 mg/dL   Total Protein 4.7 (L) 6.5 - 8.1 g/dL   Albumin 2.2 (L) 3.5 - 5.0 g/dL   AST 132 (H) 15 - 41 U/L   ALT 123 (H) 0 - 44 U/L   Alkaline  Phosphatase 91 38 - 126 U/L   Total Bilirubin 1.5 (H) 0.0 - 1.2 mg/dL   GFR, Estimated >44 >01 mL/min    Comment: (NOTE) Calculated using the CKD-EPI Creatinine Equation (2021)    Anion gap 8 5 - 15    Comment: Performed at [facility], [address] [place], [place] [address]  Phosphorus     Status: None   Collection Time: 08/05/23  4:57 AM  Result Value Ref Range   Phosphorus 3.9 2.5 - 4.6 mg/dL    Comment: Performed at [facility], [address] [place], [place] [address]  Ammonia     Status: None   Collection Time: 08/05/23  4:57 AM  Result Value Ref Range   Ammonia 16 9 - 35 umol/L    Comment: Performed at [facility], [address] [place], [place] [address]  Brain natriuretic peptide     Status: None   Collection Time: 08/05/23  5:00 AM  Result Value Ref Range   B Natriuretic Peptide 41.4 0.0 - 100.0 pg/mL    Comment: Performed at [facility] Lab, [address], [place], [place] [address]  Glucose, capillary     Status: Abnormal   Collection Time: 08/05/23  8:24 AM  Result Value Ref Range   Glucose-Capillary 146 (H)  70 - 99 mg/dL    Comment: Glucose reference range applies only to samples taken after fasting for at least 8 hours.  Glucose, capillary     Status: Abnormal   Collection Time: 08/05/23 11:31 AM  Result Value Ref Range   Glucose-Capillary 154 (H) 70 - 99 mg/dL    Comment: Glucose reference range applies only to samples taken after fasting for at least 8 hours.    DG Chest Port 1 View Result Date: 08/05/2023 CLINICAL DATA:  Shortness of breath. EXAM: PORTABLE CHEST 1 VIEW COMPARISON:  August 04, 2023. FINDINGS: Stable cardiomediastinal silhouette. Left lung is clear. Stable right basilar opacities are noted concerning for subsegmental atelectasis or possibly pneumonia. Bony thorax is unremarkable. IMPRESSION: Stable right basilar opacities as noted above. Electronically Signed   By: Lupita Raider M.D.   On: 08/05/2023 11:23   MR  CERVICAL SPINE W WO CONTRAST Result Date: 08/04/2023 CLINICAL DATA:  hyperreflexia and worsening neurological status in setting of prior C-spine surgery EXAM: MRI CERVICAL SPINE WITHOUT AND WITH CONTRAST TECHNIQUE: Multiplanar and multiecho pulse sequences of the cervical spine, to include the craniocervical junction and cervicothoracic junction, were obtained without and with intravenous contrast. CONTRAST:  8.58mL GADAVIST GADOBUTROL 1 MMOL/ML IV SOLN COMPARISON:  None Available. FINDINGS: Moderately motion limited study. Alignment: No substantial sagittal subluxation. Vertebrae: Motion limited evaluation. Edema involving the left T1 inferior articular facet. C5-C7 ACDF. Cord: Cord T2/hyperintensity from C2 to C3-C4. Posterior Fossa, vertebral arteries, paraspinal tissues: Visualized vertebral artery flow voids are maintained. No significant paraspinal edema. Disc levels: C2-C3: No significant disc protrusion, foraminal stenosis, or canal stenosis. C3-C4: Posterior disc osteophyte complex with ligamentum flavum thickening and bilateral facet and uncovertebral hypertrophy. Resulting severe canal and left foraminal stenosis. Moderate right foraminal stenosis. C4-C5: Small posterior disc osteophyte complex and left greater than right facet and uncovertebral hypertrophy. Mild left foraminal stenosis. Patent canal. C5-C6: ACDF.  Patent canal and foramina. C6-C7: ACDF.  Patent canal and foramina. C7-T1: Small posterior disc osteophyte complex and bilateral facet and uncovertebral hypertrophy. Patent canal and foramina. IMPRESSION: 1. 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 Cord T2 hyperintensity superior to this level, suspicious for edema and compressive myelopathy. Severe left and moderate right foraminal stenosis at this level. 2. Edema involving the left T1 inferior articular facet, which is nonspecific but most likely related to stress. A CT could better assess bone detail and exclude fracture if clinically warranted.  Electronically Signed   By: Feliberto Harts M.D.   On: 08/04/2023 20:53   MR THORACIC SPINE WO CONTRAST Result Date: 08/04/2023 CLINICAL DATA:  Mid-back pain, neuro deficit EXAM: MRI THORACIC SPINE WITHOUT CONTRAST TECHNIQUE: Multiplanar, multisequence MR imaging of the thoracic spine was performed. No intravenous contrast was administered. COMPARISON:  CT of the chest February 27, 2017. FINDINGS: Alignment:  No substantial sagittal subluxation. Vertebrae: Sclerotic lesion at T9 was present in 2018 and therefore likely is benign. No suspicious bone lesions. No bone marrow edema to suggest acute fracture. Vertebral body heights are maintained. Cord:  Normal cord signal. Paraspinal and other soft tissues: Motion limited evaluation without definite abnormality. Disc levels: No significant canal or foraminal stenosis. IMPRESSION: No evidence of acute abnormality or significant stenosis. Electronically Signed   By: Feliberto Harts M.D.   On: 08/04/2023 19:54   MR BRAIN WO CONTRAST Result Date: 08/04/2023 CLINICAL DATA:  Stroke, follow up EXAM: MRI HEAD WITHOUT CONTRAST TECHNIQUE: Multiplanar, multiecho pulse sequences of the brain and surrounding structures were obtained without intravenous  contrast. COMPARISON:  CT head earlier today. 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 Associated Dema without substantial mass effect. No midline shift. No evidence of acute hemorrhage, mass lesion, or hydrocephalus. Vascular: Major arterial flow voids are maintained at the skull base. Skull and upper cervical spine: Normal marrow signal. Sinuses/Orbits: Negative. IMPRESSION: Many small acute infarcts in the bilateral frontal and parietal lobes, left thalamus, right occipital lobe, and cerebellum. Given involvement of multiple vascular territories, consider an embolic etiology. Electronically Signed   By: Feliberto Harts  M.D.   On: 08/04/2023 19:25   DG Chest Port 1 View Result Date: 08/04/2023 CLINICAL DATA:  [id number] SOB (shortness of breath) [id number] EXAM: PORTABLE CHEST 1 VIEW COMPARISON:  08/01/2023 FINDINGS: Left chest wall loop recorder. Stable heart size. Low lung volumes. Chronic pleural thickening at the right costophrenic angle. Increased streaky bibasilar opacities. No definite pleural effusion. No pneumothorax. IMPRESSION: Low lung volumes with increased streaky bibasilar opacities, which may represent atelectasis versus pneumonia. Electronically Signed   By: Duanne Guess D.O.   On: 08/04/2023 12:00   EEG adult Result Date: 08/04/2023 Charlsie Quest, MD     08/04/2023 12:10 PM Patient Name: Kenneth [name] MRN: [id number] Epilepsy Attending: Charlsie Quest Referring Physician/Provider: Leroy Sea, MD Date: 08/04/2023 Duration: 25.23 mins Patient history: 73 year old male with altered mental status.  EEG to alert for seizure. Level of alertness: Awake AEDs during EEG study: None Technical aspects: This EEG study was done with scalp electrodes positioned according to the 10-20 International system of electrode placement. Electrical activity was reviewed with band pass filter of 1-70Hz , sensitivity of 7 uV/mm, display speed of 31mm/sec with a 60Hz  notched filter applied as appropriate. EEG data were recorded continuously and digitally stored.  Video monitoring was available and reviewed as appropriate. Description: The posterior dominant rhythm consists of 7 Hz activity of moderate voltage (25-35 uV) seen predominantly in posterior head regions, symmetric and reactive to eye opening and eye closing. EEG showed continuous generalized 6 to 7 Hz theta slowing. Hyperventilation and photic stimulation were not performed.   ABNORMALITY - Continuous slow, generalized IMPRESSION: This study is suggestive of mild diffuse encephalopathy. No seizures or epileptiform discharges were seen throughout the recording.  Priyanka Annabelle Harman   CT HEAD WO CONTRAST ( ) Result Date: 08/04/2023 CLINICAL DATA:  Provided history: Memory loss. EXAM: CT HEAD WITHOUT CONTRAST TECHNIQUE: Contiguous axial images were obtained from the base of the skull through the vertex without intravenous contrast. RADIATION DOSE REDUCTION: This exam was performed according to the departmental dose-optimization program which includes automated exposure control, adjustment of the mA and/or kV according to patient size and/or use of iterative reconstruction technique. COMPARISON:  Non-contrast head CT and CT angiogram head 04/06/2023. Brain MRI 04/06/2023. FINDINGS: Brain: Moderate generalized cerebral atrophy. 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 Small chronic cortical/subcortical infarct within the mid right frontal lobe, new from the prior head CT of 04/06/2023. There is no acute intracranial hemorrhage. No acute demarcated cortical infarct. No extra-axial fluid collection. No evidence of an intracranial mass. No midline shift. Vascular: No hyperdense vessel. Atherosclerotic calcifications. Skull: No calvarial fracture or aggressive osseous lesion. Sinuses/Orbits: No mass or acute finding within the imaged orbits. Minimal mucosal thickening within the bilateral maxillary and  right sphenoid sinuses. Mild mucosal thickening within the bilateral ethmoid sinuses. Minimal mucosal thickening within the left frontal sinus. IMPRESSION: 1. No evidence of an acute intracranial abnormality. 2. 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 3. 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 4. Moderate generalized cerebral atrophy. 5. Paranasal sinus disease as described.  Electronically Signed   By: Jackey Loge D.O.   On: 08/04/2023 09:40    Review of Systems  Constitutional:  Positive for activity change.  HENT:  Positive for hearing loss.   Eyes: Negative.   Respiratory: Negative.    Cardiovascular: Negative.   Gastrointestinal: Negative.   Endocrine: Negative.   Genitourinary: Negative.   Musculoskeletal:  Positive for neck pain and neck stiffness.  Skin: Negative.   Neurological:  Positive for weakness.   Blood pressure (!) 106/52, pulse 68, temperature (!) 97.4 F (36.3 C), temperature source Oral, resp. rate (!) 22, height 5\' 11"  (1.803 m), weight 86.6 kg, SpO2 96%. Physical Exam Constitutional:      General: He is in acute distress.     Appearance: He is ill-appearing and toxic-appearing.  HENT:     Head: Normocephalic.     Nose: Nose normal.     Mouth/Throat:     Mouth: Mucous membranes are moist.     Pharynx: Oropharynx is clear.  Eyes:     Extraocular Movements: Extraocular movements intact.     Conjunctiva/sclera: Conjunctivae normal.     Pupils: Pupils are equal, round, and reactive to light.  Neck:     Comments: Holds neck flexed and rotated to the left. Cardiovascular:     Rate and Rhythm: Normal rate and regular rhythm.  Pulmonary:     Effort: Pulmonary effort is normal.  Abdominal:     General: Abdomen is flat.  Musculoskeletal:     Cervical back: Neck supple.  Skin:    General: Skin is warm and dry.  Psychiatric:        Mood and Affect: Mood is depressed. Affect is flat.        Speech: He is noncommunicative.        Behavior: Behavior is slowed and withdrawn.        Cognition and Memory: Cognition is impaired. He exhibits impaired recent memory.     Assessment/Plan: JUSTIN MEISENHEIMER is a 73 y.o. male With rapid mental decline, and strength. MRI shows severe cervical stenosis at C3/4 with altered signal. He is quite debilitated at this time. While I believe he needs to be decompressed, I also believe that this is  but a small part of Mr. Coster current medical dilemma. Dr. Thedore Mins would like some time to improve Mr. Marquis's overall health prior to an operation. I concur. I have explained this to his wife.  Coletta Memos 08/05/2023, 3:32 PM

## 2023-08-05 NOTE — Progress Notes (Addendum)
 PROGRESS NOTE                                                                                                                                                                                                             Patient Demographics:    Kenneth [name], is a 73 y.o. male, DOB - 03-Aug-1950, ZOX:[id number]  Outpatient Primary MD for the patient is [name], MD    LOS - 4  Admit date - 08/01/2023    Chief Complaint  Patient presents with   Weakness       Brief Narrative (HPI from H&P)   73 y.o. male, with history of stroke, currently on DAPT, dyslipidemia, BPH, prostate cancer, insulin-dependent DM type II, hypertension, cervical stenosis, history of iron deficiency anemia, history of OSA does not wear CPAP, recent history of right ankle strain 2 weeks ago, presents to the hospital for 2-3 history of gradually progressive weakness, was also not eating or drinking well for a few days.  For the last couple of days he has been having problems getting out of the bed and ambulating as he is extremely weak, in the ER he was found to be hypotensive, had hypomagnesemia, was also found to have new onset anemia and I was called to admit the patient.    Subjective:   Patient in bed, appears comfortable, denies any headache, no fever, no chest pain or pressure, no shortness of breath , no abdominal pain. No new focal weakness.   Assessment  & Plan :    1.  Gradually progressive generalized weakness in a patient with gradually progressive anemia of chronic disease, dehydration, hypomagnesemia.   Patient has had chronic anemia for several years however there is at least a 3 g drop in his hemoglobin in the last few months, of note he has been started on DAPT for his recent stroke around that timeframe, he denies any hematemesis, no dark stools or blood in stool.  He likely has intermittent chronic GI blood loss, Hemoccult negative in  the ER, place him on PPI, for now continue DAPT, GI has been consulted, EGD done on 08/02/2023 did not show any acute bleed, check peripheral smear, anemia panel noted with possible mild iron deficiency and B12 deficiency both have been replaced, CBC is currently stable, will also have him follow-up with hematology outpatient along  with GI for continued anemia workup.  EGD done and except mild gastritis unremarkable, no active bleeding, TSH stable, iron B12 being replaced.  PT OT and advance activity.  Wife requesting neurology input which will be obtained.  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      2.  Metabolic encephalopathy, generalized weakness.  Underlying depression.  No new focal deficits, currently see above, had extensive workup and seen by neurology, stable TSH, stable ABG, stable ammonia, stable EEG, MRI showing multiple acute and subacute strokes, kindly see below.  Continue supportive care, no focal deficits but continued several months decline in functional status, wondering if multiple strokes also taking a toll.  MRI brain and C-spine noted, kindly see stroke discussion below, C-spine findings discussed with Dr. Amada Jupiter, he requests a neurosurgery consult which will be obtained.  At this time no focal deficits.   3.  Recent stroke few months ago.  On DAPT, statin and Zetia, MRI noted, had 2 different episodes of cryptogenic bilateral strokes 2 months and 8 months ago respectively, MRI now again shows new bilateral cryptogenic strokes, he has had loop order placement and evaluation which was unremarkable few months ago, neuro team on board, case discussed with stroke team, per stroke team he is only a candidate for aspirin and statin and Zetia treatment, stroke team wants to  discontinue Brilinta, we will also look for reevaluation of loop recorder, CT chest abdomen pelvis to rule out occult malignancy causing hypercoagulable state, overall not many good options unless we find evidence of A-fib on loop recorder.  Team will discuss the plan with wife as well.   4.  Dyslipidemia.  Continue statin and Zetia.   5.  BPH.  Continue Flomax.  UA stable no UTI.   6.  Depression.  Psych consulted, Lexapro was stopped by psych, defer any additional medications or changes in psych medications to the psych team.   7.  OSA.  Does not use CPAP.     8.  Essential hypertension.  Currently blood pressure low, dehydrated, hold blood pressure medications hydrate and monitor.     9.  History of cervical stenosis, recent right ankle strain.  Supportive care.     10.  Borderline iron and B12 deficiency.  Replaced.    11.  Hyponatremia.  has been hydrated adequately, stable.  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  Fluid restriction and monitor.  12. Hypomagnesemia and dehydration.  Hydrated with IV fluids, magnesium replaced stable.  13.  Oral intake and appetite.  Add Megace.  Treat depression.    14. DM type II.  On high doses of insulin at home, continue with q. [facility] S sliding scale.  Every 2 Accu-Cheks while n.p.o. on 08/02/2023.   CBG (last 3)  Recent Labs    08/04/23 1705 08/04/23 2114 08/05/23 0824  GLUCAP 157* 173* 146*    Lab Results  Component Value Date   HGBA1C 5.3 08/01/2023         Condition - Guarded  Family Communication  : Wife on the day of admission 08/01/2023 bedside, wife over the phone 08/02/2023, bedside 08/03/2023, 08/04/2023 x 3, 08/05/2023  Code Status : Full code  Consults  : GI, Neuro N. surgery  PUD Prophylaxis : PPI   Procedures  :  EEG.  Nonacute.    MRI brain.  Many small acute infarcts in the bilateral frontal and parietal lobes, left thalamus, right occipital lobe, and cerebellum. Given involvement of multiple vascular  territories, consider an embolic etiology  MRI C-spine.  1. 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 Cord T2 hyperintensity superior to this level, suspicious for edema and compressive myelopathy. Severe left and moderate right foraminal stenosis at this level. 2. Edema involving the left T1 inferior articular facet, which is nonspecific but most likely related to stress. A CT could better assess bone detail and exclude fracture if clinically warranted.  CT head.  1. No evidence of an acute intracranial abnormality. 2. 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 3. 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 4. Moderate generalized cerebral atrophy. 5. Paranasal sinus disease as described.   EGD  Impression:               - The examined portions of the nasopharynx,                            oropharynx and larynx were normal.                           - Normal esophagus.                           - 3 cm hiatal hernia with prolapsing cardia.                           - Gastritis. Biopsied.                           - Normal examined duodenum.                           - No obvious source of GI bleeding. Doubt mild                            gastritis would have been a source of significant                            bleed. Moderate Sedation:      N/A Recommendation:           - Patient has a contact number available for                            emergencies. The signs and symptoms of potential                            delayed complications were discussed with the                            patient. Return to normal activities tomorrow.                            Written discharge instructions were provided to the  patient.                           - Resume previous diet.                           - Continue present medications  including                            Brillinta/ASA.                           - Await pathology results.                           - Consider non-GI sources of anemia, given absence                            of overt bleeding, negative FOBT and concomitant                            thrombocytopenia.                           - Colonoscopy could be considered for further                            evaluation, but likely low yield, given his recent                            colonoscopy in 2021.                           - GI will sign off for now. If no other possible                            etiologies for anemia identified, or if patient has                            overt GI bleeding, please reconsult GI.      Disposition Plan  :    Status is: Inpatient   DVT Prophylaxis  :    SCDs Start: 08/01/23 1239 Place TED hose Start: 08/01/23 1233    Lab Results  Component Value Date   PLT 97 (L) 08/05/2023    Diet :  Diet Order             Diet regular Room service appropriate? Yes with Assist; Fluid consistency: Thin  Diet effective now                    Inpatient Medications  Scheduled Meds:  aspirin  81 mg Oral Daily   atorvastatin  40 mg Oral QHS   vitamin B-12  1,000 mcg Oral Daily   ezetimibe  10 mg Oral Daily   ferrous sulfate  325 mg Oral BID WC   folic acid  1 mg Oral Daily   insulin aspart  0-15 Units Subcutaneous TID WC   insulin aspart  0-5 Units Subcutaneous QHS  insulin glargine-yfgn  30 Units Subcutaneous Q1400   megestrol  40 mg Oral Daily   pantoprazole  40 mg Oral BID AC   tamsulosin  0.4 mg Oral QPM   thiamine  100 mg Oral q AM   ticagrelor  90 mg Oral BID   Continuous Infusions:   PRN Meds:.acetaminophen **OR** acetaminophen, albuterol, bisacodyl, menthol-cetylpyridinium, [DISCONTINUED] ondansetron **OR** ondansetron (ZOFRAN) IV, phenol  Antibiotics  :    Anti-infectives (From admission, onward)    None          Objective:   Vitals:   08/04/23 1940 08/04/23 2323 08/05/23 0302 08/05/23 0800  BP: (!) 120/47 (!) 116/44 (!) 108/50 119/69  Pulse: 80 69 66 67  Resp: 18 (!) 21 18 19   Temp: 97.6 F (36.4 C) 99.2 F (37.3 C) 98 F (36.7 C) 99.4 F (37.4 C)  TempSrc: Oral Axillary Axillary Axillary  SpO2: 94% 93% 96% 94%  Weight:      Height:        Wt Readings from Last 3 Encounters:  08/01/23 86.6 kg  05/21/23 88.7 kg  05/01/23 89.3 kg     Intake/Output Summary (Last 24 hours) at 08/05/2023 1042 Last data filed at 08/05/2023 0900 Gross per 24 hour  Intake 240 ml  Output 700 ml  Net -460 ml     Physical Exam  Awake but pleasantly confused, No new F.N deficits, overall quite deconditioned [place].AT,PERRAL Supple Neck, No JVD,   Symmetrical Chest wall movement, Good air movement bilaterally, CTAB RRR,No Gallops,Rubs or new Murmurs,  +ve B.Sounds, Abd Soft, No tenderness,   No Cyanosis, Clubbing or edema        Data Review:    Recent Labs  Lab 08/02/23 0549 08/03/23 0513 08/03/23 1834 08/04/23 0452 08/05/23 0457  WBC 7.8 7.5 8.9 8.5 9.0  HGB 8.7* 8.8* 9.6* 8.7* 9.0*  HCT 25.0* 24.9* 27.3* 24.7* 26.3*  PLT 107* 108* 101* 101* 97*  MCV 85.3 85.6 86.7 84.6 86.5  MCH 29.7 30.2 30.5 29.8 29.6  MCHC 34.8 35.3 35.2 35.2 34.2  RDW 14.8 14.8 15.2 14.9 15.3  LYMPHSABS 0.2* 1.0 1.0 0.5* 0.9  MONOABS 0.5 2.0* 1.9* 0.7 2.4*  EOSABS 0.0 0.0 0.0 0.0 0.0  BASOSABS 0.0 0.0 0.0 0.1 0.0    Recent Labs  Lab 08/01/23 0630 08/01/23 0913 08/01/23 1310 08/02/23 0549 08/03/23 0513 08/04/23 0452 08/04/23 0845 08/05/23 0457 08/05/23 0500  NA 132*  --   --  131* 131* 130*  --  132*  --   K 4.1  --   --  3.7 3.7 3.7  --  3.5  --   CL 101  --   --  100 102 101  --  101  --   CO2 21*  --   --  22 24 24   --  23  --   ANIONGAP 10  --   --  9 5 5   --  8  --   GLUCOSE 168*  --   --  143* 146* 159*  --  136*  --   BUN 17  --   --  12 17 20   --  29*  --   CREATININE 0.87  --   --  0.81 0.87  0.89  --  0.88  --   AST 26  --   --  19 25 47*  --  123*  --   ALT 15  --   --  17 22 40  --  123*  --   ALKPHOS 43  --   --  43 45 60  --  91  --   BILITOT 1.9*  --   --  1.4* 1.3* 1.3*  --  1.5*  --   ALBUMIN 2.6*  --   --  2.5* 2.4* 2.2*  --  2.2*  --   TSH  --   --  1.499  --   --   --   --   --   --   HGBA1C 5.3  --   --   --   --   --   --   --   --   AMMONIA  --   --   --   --   --   --  17 16  --   BNP  --  33.0  --  72.3 53.6 27.8  --   --  41.4  MG 1.4*  --   --  1.7 1.6* 1.9  --  1.8  --   PHOS  --   --   --  3.6 3.6 3.8  --  3.9  --   CALCIUM 8.1*  --   --  8.3* 7.8* 7.7*  --  8.0*  --       Recent Labs  Lab 08/01/23 0630 08/01/23 0913 08/01/23 1310 08/02/23 0549 08/03/23 0513 08/04/23 0452 08/04/23 0845 08/05/23 0457 08/05/23 0500  TSH  --   --  1.499  --   --   --   --   --   --   HGBA1C 5.3  --   --   --   --   --   --   --   --   AMMONIA  --   --   --   --   --   --  17 16  --   BNP  --  33.0  --  72.3 53.6 27.8  --   --  41.4  MG 1.4*  --   --  1.7 1.6* 1.9  --  1.8  --   CALCIUM 8.1*  --   --  8.3* 7.8* 7.7*  --  8.0*  --     --------------------------------------------------------------------------------------------------------------- Lab Results  Component Value Date   CHOL 79 04/07/2023   HDL 17 (L) 04/07/2023   LDLCALC 47 04/07/2023   TRIG 75 04/07/2023   CHOLHDL 4.6 04/07/2023    Lab Results  Component Value Date   HGBA1C 5.3 08/01/2023   No results for input(s): "TSH", "T4TOTAL", "FREET4", "T3FREE", "THYROIDAB" in the last 72 hours.  No results for input(s): "VITAMINB12", "FOLATE", "FERRITIN", "TIBC", "IRON", "RETICCTPCT" in the last 72 hours.  ------------------------------------------------------------------------------------------------------------------ Cardiac Enzymes No results for input(s): "CKMB", "TROPONINI", "MYOGLOBIN" in the last 168 hours.  Invalid input(s): "CK"  Micro Results Recent Results (from the past 240 hours)   Resp panel by RT-PCR (RSV, Flu A&B, Covid) Anterior Nasal Swab     Status: None   Collection Time: 08/01/23  7:13 AM   Specimen: Anterior Nasal Swab  Result Value Ref Range Status   SARS Coronavirus 2 by RT PCR NEGATIVE NEGATIVE Final   Influenza A by PCR NEGATIVE NEGATIVE Final   Influenza B by PCR NEGATIVE NEGATIVE Final    Comment: (NOTE) The Xpert Xpress SARS-CoV-2/FLU/RSV plus assay is intended as an aid in the diagnosis of influenza from Nasopharyngeal swab specimens and should not be used as a sole basis for treatment. Nasal washings and  aspirates are unacceptable for Xpert Xpress SARS-CoV-2/FLU/RSV testing.  Fact Sheet for Patients: BloggerCourse.com  Fact Sheet for Healthcare Providers: SeriousBroker.it  This test is not yet approved or cleared by the [place] FDA and has been authorized for detection and/or diagnosis of SARS-CoV-2 by FDA under an Emergency Use Authorization (EUA). This EUA will remain in effect (meaning this test can be used) for the duration of the COVID-19 declaration under Section 564(b)(1) of the Act, 21 U.S.C. section 360bbb-3(b)(1), unless the authorization is terminated or revoked.     Resp Syncytial Virus by PCR NEGATIVE NEGATIVE Final    Comment: (NOTE) Fact Sheet for Patients: BloggerCourse.com  Fact Sheet for Healthcare Providers: SeriousBroker.it  This test is not yet approved or cleared by the [place] FDA and has been authorized for detection and/or diagnosis of SARS-CoV-2 by FDA under an Emergency Use Authorization (EUA). This EUA will remain in effect (meaning this test can be used) for the duration of the COVID-19 declaration under Section 564(b)(1) of the Act, 21 U.S.C. section 360bbb-3(b)(1), unless the authorization is terminated or revoked.  Performed at [facility], [address], [place],  [place] [address]     Radiology Report MR CERVICAL SPINE W WO CONTRAST Result Date: 08/04/2023 CLINICAL DATA:  hyperreflexia and worsening neurological status in setting of prior C-spine surgery EXAM: MRI CERVICAL SPINE WITHOUT AND WITH CONTRAST TECHNIQUE: Multiplanar and multiecho pulse sequences of the cervical spine, to include the craniocervical junction and cervicothoracic junction, were obtained without and with intravenous contrast. CONTRAST:  8.14mL GADAVIST GADOBUTROL 1 MMOL/ML IV SOLN COMPARISON:  None Available. FINDINGS: Moderately motion limited study. Alignment: No substantial sagittal subluxation. Vertebrae: Motion limited evaluation. Edema involving the left T1 inferior articular facet. C5-C7 ACDF. Cord: Cord T2/hyperintensity from C2 to C3-C4. Posterior Fossa, vertebral arteries, paraspinal tissues: Visualized vertebral artery flow voids are maintained. No significant paraspinal edema. Disc levels: C2-C3: No significant disc protrusion, foraminal stenosis, or canal stenosis. C3-C4: Posterior disc osteophyte complex with ligamentum flavum thickening and bilateral facet and uncovertebral hypertrophy. Resulting severe canal and left foraminal stenosis. Moderate right foraminal stenosis. C4-C5: Small posterior disc osteophyte complex and left greater than right facet and uncovertebral hypertrophy. Mild left foraminal stenosis. Patent canal. C5-C6: ACDF.  Patent canal and foramina. C6-C7: ACDF.  Patent canal and foramina. C7-T1: Small posterior disc osteophyte complex and bilateral facet and uncovertebral hypertrophy. Patent canal and foramina. IMPRESSION: 1. 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 Cord T2 hyperintensity superior to this level, suspicious for edema and compressive myelopathy. Severe left and moderate right foraminal stenosis at this level. 2. Edema involving the left T1 inferior articular facet, which is nonspecific but most likely related to stress. A CT could better assess bone detail and exclude  fracture if clinically warranted. Electronically Signed   By: Feliberto Harts M.D.   On: 08/04/2023 20:53   MR THORACIC SPINE WO CONTRAST Result Date: 08/04/2023 CLINICAL DATA:  Mid-back pain, neuro deficit EXAM: MRI THORACIC SPINE WITHOUT CONTRAST TECHNIQUE: Multiplanar, multisequence MR imaging of the thoracic spine was performed. No intravenous contrast was administered. COMPARISON:  CT of the chest February 27, 2017. FINDINGS: Alignment:  No substantial sagittal subluxation. Vertebrae: Sclerotic lesion at T9 was present in 2018 and therefore likely is benign. No suspicious bone lesions. No bone marrow edema to suggest acute fracture. Vertebral body heights are maintained. Cord:  Normal cord signal. Paraspinal and other soft tissues: Motion limited evaluation without definite abnormality. Disc levels: No significant canal or foraminal stenosis. IMPRESSION:  No evidence of acute abnormality or significant stenosis. Electronically Signed   By: Feliberto Harts M.D.   On: 08/04/2023 19:54   MR BRAIN WO CONTRAST Result Date: 08/04/2023 CLINICAL DATA:  Stroke, follow up EXAM: MRI HEAD WITHOUT CONTRAST TECHNIQUE: Multiplanar, multiecho pulse sequences of the brain and surrounding structures were obtained without intravenous contrast. COMPARISON:  CT head earlier today. 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 Associated Dema without substantial mass effect. No midline shift. No evidence of acute hemorrhage, mass lesion, or hydrocephalus. Vascular: Major arterial flow voids are maintained at the skull base. Skull and upper cervical spine: Normal marrow signal. Sinuses/Orbits: Negative. IMPRESSION: Many small acute infarcts in the bilateral frontal and parietal lobes, left thalamus, right occipital lobe, and cerebellum. Given involvement of multiple vascular territories, consider an embolic etiology. Electronically  Signed   By: Feliberto Harts M.D.   On: 08/04/2023 19:25   DG Chest Port 1 View Result Date: 08/04/2023 CLINICAL DATA:  [id number] SOB (shortness of breath) [id number] EXAM: PORTABLE CHEST 1 VIEW COMPARISON:  08/01/2023 FINDINGS: Left chest wall loop recorder. Stable heart size. Low lung volumes. Chronic pleural thickening at the right costophrenic angle. Increased streaky bibasilar opacities. No definite pleural effusion. No pneumothorax. IMPRESSION: Low lung volumes with increased streaky bibasilar opacities, which may represent atelectasis versus pneumonia. Electronically Signed   By: Duanne Guess D.O.   On: 08/04/2023 12:00   EEG adult Result Date: 08/04/2023 Charlsie Quest, MD     08/04/2023 12:10 PM Patient Name: OTHA MONICAL MRN: [id number] Epilepsy Attending: Charlsie Quest Referring Physician/Provider: Leroy Sea, MD Date: 08/04/2023 Duration: 25.23 mins Patient history: 73 year old male with altered mental status.  EEG to alert for seizure. Level of alertness: Awake AEDs during EEG study: None Technical aspects: This EEG study was done with scalp electrodes positioned according to the 10-20 International system of electrode placement. Electrical activity was reviewed with band pass filter of 1-70Hz , sensitivity of 7 uV/mm, display speed of 49mm/sec with a 60Hz  notched filter applied as appropriate. EEG data were recorded continuously and digitally stored.  Video monitoring was available and reviewed as appropriate. Description: The posterior dominant rhythm consists of 7 Hz activity of moderate voltage (25-35 uV) seen predominantly in posterior head regions, symmetric and reactive to eye opening and eye closing. EEG showed continuous generalized 6 to 7 Hz theta slowing. Hyperventilation and photic stimulation were not performed.   ABNORMALITY - Continuous slow, generalized IMPRESSION: This study is suggestive of mild diffuse encephalopathy. No seizures or epileptiform discharges were seen  throughout the recording. Priyanka Annabelle Harman   CT HEAD WO CONTRAST ( ) Result Date: 08/04/2023 CLINICAL DATA:  Provided history: Memory loss. EXAM: CT HEAD WITHOUT CONTRAST TECHNIQUE: Contiguous axial images were obtained from the base of the skull through the vertex without intravenous contrast. RADIATION DOSE REDUCTION: This exam was performed according to the departmental dose-optimization program which includes automated exposure control, adjustment of the mA and/or kV according to patient size and/or use of iterative reconstruction technique. COMPARISON:  Non-contrast head CT and CT angiogram head 04/06/2023. Brain MRI 04/06/2023. FINDINGS: Brain: Moderate generalized cerebral atrophy. 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 Small chronic cortical/subcortical infarct within the mid right frontal lobe, new from the prior head CT of  04/06/2023. There is no acute intracranial hemorrhage. No acute demarcated cortical infarct. No extra-axial fluid collection. No evidence of an intracranial mass. No midline shift. Vascular: No hyperdense vessel. Atherosclerotic calcifications. Skull: No calvarial fracture or aggressive osseous lesion. Sinuses/Orbits: No mass or acute finding within the imaged orbits. Minimal mucosal thickening within the bilateral maxillary and right sphenoid sinuses. Mild mucosal thickening within the bilateral ethmoid sinuses. Minimal mucosal thickening within the left frontal sinus. IMPRESSION: 1. No evidence of an acute intracranial abnormality. 2. 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 3. 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 4. Moderate generalized cerebral atrophy. 5. Paranasal sinus  disease as described. Electronically Signed   By: Jackey Loge D.O.   On: 08/04/2023 09:40      Signature  -   Susa Raring M.D on 08/05/2023 at 10:42 AM   -  To page go to [email or web address]

## 2023-08-05 NOTE — Progress Notes (Addendum)
 Pt wife stated neurosurgeon stated he needs c-collar, I paged the office and the operator stated I should only page for emergencies, I decided not to page [place] and wait until tomorrow 3/27 to address if the c collar can be ordered.   Balinda Quails, RN 08/05/2023 6:03 PM    Verified with Franky Macho pt does not need a C-Collar, please continue to educate family we only need C collar for neck fractures.   Balinda Quails, RN 08/05/2023 6:46 PM

## 2023-08-05 NOTE — TOC Progression Note (Signed)
 Transition of Care [facility]) - Progression Note    Patient Details  Name: Kenneth [name] MRN: [id number] Date of Birth: [birth year]  Transition of Care [facility]) CM/SW Contact  Mearl Latin, LCSW Phone Number: 08/05/2023, 10:55 AM  Clinical Narrative:    Per MD, patient would be a good Inpatient Rehab candidate. Spouse was interested in [facility]. CSW will send referral for review.    Expected Discharge Plan: Skilled Nursing Facility Barriers to Discharge: Continued Medical Work up, SNF Pending bed offer  Expected Discharge Plan and Services In-house Referral: Clinical Social Work   Post Acute Care Choice: Skilled Nursing Facility Living arrangements for the past 2 months: Single Family Home                                       Social Determinants of Health (SDOH) Interventions SDOH Screenings   Food Insecurity: No Food Insecurity (08/01/2023)  Housing: Low Risk  (08/01/2023)  Transportation Needs: No Transportation Needs (08/01/2023)  Utilities: Not At Risk (08/01/2023)  Depression (PHQ2-9): Low Risk  (11/07/2022)  Social Connections: Socially Isolated (08/01/2023)  Tobacco Use: Medium Risk (08/01/2023)    Readmission Risk Interventions     No data to display

## 2023-08-05 NOTE — Progress Notes (Addendum)
 STROKE TEAM PROGRESS NOTE   INTERIM HISTORY/SUBJECTIVE MRI with many small acute infarcts in bilateral frontal and parietal lobes, left thalamus, right occipital lobe and cerebellum  Family  at the bedside.  Patient is awake and alert he is oriented to hospital, self and knows his wife.  Unable to state situation or correct month stated age was "73"  Will check CT abdomen chest and pelvis for malignancy workup.  EEG was with continuous generalized slowing. Vitamin D 51.64 B1 and B6 pending RPR negative OBJECTIVE  CBC    Component Value Date/Time   WBC 9.0 08/05/2023 0457   RBC 3.04 (L) 08/05/2023 0457   HGB 9.0 (L) 08/05/2023 0457   HGB 12.3 (L) 05/01/2023 1204   HCT 26.3 (L) 08/05/2023 0457   PLT 97 (L) 08/05/2023 0457   PLT 249 05/01/2023 1204   MCV 86.5 08/05/2023 0457   MCH 29.6 08/05/2023 0457   MCHC 34.2 08/05/2023 0457   RDW 15.3 08/05/2023 0457   LYMPHSABS 0.9 08/05/2023 0457   MONOABS 2.4 (H) 08/05/2023 0457   EOSABS 0.0 08/05/2023 0457   BASOSABS 0.0 08/05/2023 0457    BMET    Component Value Date/Time   NA 132 (L) 08/05/2023 0457   NA 142 04/06/2017 1057   K 3.5 08/05/2023 0457   CL 101 08/05/2023 0457   CO2 23 08/05/2023 0457   GLUCOSE 136 (H) 08/05/2023 0457   BUN 29 (H) 08/05/2023 0457   BUN 13 04/06/2017 1057   CREATININE 0.88 08/05/2023 0457   CREATININE 0.94 05/01/2023 1204   CALCIUM 8.0 (L) 08/05/2023 0457   GFRNONAA >60 08/05/2023 0457   GFRNONAA >60 05/01/2023 1204    IMAGING past 24 hours DG Chest Port 1 View Result Date: 08/05/2023 CLINICAL DATA:  Shortness of breath. EXAM: PORTABLE CHEST 1 VIEW COMPARISON:  August 04, 2023. FINDINGS: Stable cardiomediastinal silhouette. Left lung is clear. Stable right basilar opacities are noted concerning for subsegmental atelectasis or possibly pneumonia. Bony thorax is unremarkable. IMPRESSION: Stable right basilar opacities as noted above. Electronically Signed   By: Lupita Raider M.D.   On:  08/05/2023 11:23   MR CERVICAL SPINE W WO CONTRAST Result Date: 08/04/2023 CLINICAL DATA:  hyperreflexia and worsening neurological status in setting of prior C-spine surgery EXAM: MRI CERVICAL SPINE WITHOUT AND WITH CONTRAST TECHNIQUE: Multiplanar and multiecho pulse sequences of the cervical spine, to include the craniocervical junction and cervicothoracic junction, were obtained without and with intravenous contrast. CONTRAST:  8.22mL GADAVIST GADOBUTROL 1 MMOL/ML IV SOLN COMPARISON:  None Available. FINDINGS: Moderately motion limited study. Alignment: No substantial sagittal subluxation. Vertebrae: Motion limited evaluation. Edema involving the left T1 inferior articular facet. C5-C7 ACDF. Cord: Cord T2/hyperintensity from C2 to C3-C4. Posterior Fossa, vertebral arteries, paraspinal tissues: Visualized vertebral artery flow voids are maintained. No significant paraspinal edema. Disc levels: C2-C3: No significant disc protrusion, foraminal stenosis, or canal stenosis. C3-C4: Posterior disc osteophyte complex with ligamentum flavum thickening and bilateral facet and uncovertebral hypertrophy. Resulting severe canal and left foraminal stenosis. Moderate right foraminal stenosis. C4-C5: Small posterior disc osteophyte complex and left greater than right facet and uncovertebral hypertrophy. Mild left foraminal stenosis. Patent canal. C5-C6: ACDF.  Patent canal and foramina. C6-C7: ACDF.  Patent canal and foramina. C7-T1: Small posterior disc osteophyte complex and bilateral facet and uncovertebral hypertrophy. Patent canal and foramina. IMPRESSION: 1. 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 Cord T2 hyperintensity superior to this level, suspicious for edema and compressive myelopathy. Severe left and moderate right foraminal stenosis  at this level. 2. Edema involving the left T1 inferior articular facet, which is nonspecific but most likely related to stress. A CT could better assess bone detail and exclude fracture if  clinically warranted. Electronically Signed   By: Feliberto Harts M.D.   On: 08/04/2023 20:53   MR THORACIC SPINE WO CONTRAST Result Date: 08/04/2023 CLINICAL DATA:  Mid-back pain, neuro deficit EXAM: MRI THORACIC SPINE WITHOUT CONTRAST TECHNIQUE: Multiplanar, multisequence MR imaging of the thoracic spine was performed. No intravenous contrast was administered. COMPARISON:  CT of the chest February 27, 2017. FINDINGS: Alignment:  No substantial sagittal subluxation. Vertebrae: Sclerotic lesion at T9 was present in 2018 and therefore likely is benign. No suspicious bone lesions. No bone marrow edema to suggest acute fracture. Vertebral body heights are maintained. Cord:  Normal cord signal. Paraspinal and other soft tissues: Motion limited evaluation without definite abnormality. Disc levels: No significant canal or foraminal stenosis. IMPRESSION: No evidence of acute abnormality or significant stenosis. Electronically Signed   By: Feliberto Harts M.D.   On: 08/04/2023 19:54    Vitals:   08/04/23 2323 08/05/23 0302 08/05/23 0800 08/05/23 1230  BP: (!) 116/44 (!) 108/50 119/69 (!) 106/52  Pulse: 69 66 67 68  Resp: (!) 21 18 19  (!) 22  Temp: 99.2 F (37.3 C) 98 F (36.7 C) 99.4 F (37.4 C) (!) 97.4 F (36.3 C)  TempSrc: Axillary Axillary Axillary Oral  SpO2: 93% 96% 94% 96%  Weight:      Height:         PHYSICAL EXAM General:  Alert, well-nourished, well-developed patient in no acute distress Psych:  Mood and affect appropriate for situation CV: Regular rate and rhythm on monitor Respiratory:  Regular, unlabored respirations on room air GI: Abdomen soft and nontender   NEURO:  Mental Status: AA&O  oriented to hospital, self and knows his wife.  Unable to state situation or correct month stated age was "73".  He is also hard of hearing  Speech/Language: speech is without dysarthria or aphasia.  Naming, repetition, fluency, and comprehension intact.  Cranial Nerves:  II: PERRL.  Visual fields full.  III, IV, VI: EOMI. Eyelids elevate symmetrically.  V: Sensation is intact to light touch and symmetrical to face.  VII: Face is symmetrical resting and smiling VIII: hearing intact to voice. IX, X: Palate elevates symmetrically. Phonation is normal.  RU:[id number] shrug 5/5. XII: tongue is midline without fasciculations. Motor: 5/5 strength to all muscle groups tested.  Tone: is normal and bulk is normal Sensation- Intact to light touch bilaterally. Extinction absent to light touch to DSS.   Coordination: FTN intact bilaterally, HKS: no ataxia in BLE.No drift.  Gait- deferred  Most Recent NIH 2   ASSESSMENT/PLAN  [name] is a 73 y.o. male with history of prior history of cryptogenic strokes, diabetes, hyperlipidemia, hypertension, iron deficiency anemia, OSA, prostate cancer admitted for gradually progressive weakness and poor nutrition intake.    Acute Ischemic Infarct:  bilateral frontal and parietal lobes, left thalamus, right occipital lobe and cerebellum Etiology: Cryptogenic CT head No acute abnormality.  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 Cord T2 hyperintensity superior to this level, suspicious for edema and compressive myelopathy. Severe left and moderate right foraminal stenosis at this level. MRI T-spine Edema involving the left T1 inferior articular facet,  2D Echo ordered CT chest abdomen  pelvis to rule out malignancy Loop recorder being interrogated waiting for industry to get back LDL 47 HgbA1c 5.3 EEG diffuse slowing.  No seizure activity VTE prophylaxis -SCDs aspirin 81 mg daily and Brilinta (ticagrelor) 90 mg bid prior to admission, now on aspirin 81 mg daily and Brilinta (ticagrelor) 90 mg bid  Therapy recommendations:  Pending Disposition: Pending  Hx of Stroke/TIA Bilateral ACA strokes in  2024 November 2024 left frontal and parietal infarcts  Hypertension Home meds: Lotensin 40 mg Hygroton 25 mg Stable Blood Pressure Goal: BP less than 220/110   Hyperlipidemia Home meds: Atorvastatin 40 mg and Zetia 10 mg,  resumed in hospital LDL 47, goal < 70 Continue statin at discharge  Diabetes type II Controlled Home meds: Metformin, Toujeo HgbA1c 5.3, goal < 7.0 CBGs SSI Recommend close follow-up with PCP for better DM control  Dysphagia Patient has post-stroke dysphagia, SLP consulted    Diet   Diet regular Room service appropriate? Yes with Assist; Fluid consistency: Thin   Advance diet as tolerated  Other Stroke Risk Factors Obstructive sleep apnea, not on CPAP at home    Other Active Problems BPH Depression B12 deficiency Deficiency Cognitive impairment   Hospital day # 4   Kenneth Mart DNP, ACNPC-AG  [facility]  I have personally obtained history,examined this patient, reviewed notes, independently viewed imaging studies, participated in medical decision making and plan of care.ROS completed by me personally and pertinent positives fully documented  I have made any additions or clarifications directly to the above note. Agree with note above.  Patient presented with 2-day history of increasing confusion and MRI scan shows by cerebral embolic infarcts of cryptogenic etiology.  Recommend interrogate loop recorder for paroxysmal A-fib.  EEG shows diffuse slowing but no seizure activity.  Check CT scan chest abdomen pelvis to look for occult malignancy.  Continue ongoing stroke workup.  Long discussion patient and family at the bedside and answered questions.  Discussed with Dr. Thedore Mins.  Greater than 50% time during this 50-minute visit was spent in counseling coordination of care and discussion patient and care team and answering questions.  Kenneth Heady, MD Medical Director [facility] Stroke Center Pager: [phone] 08/05/2023 5:09 PM   To  contact Stroke Continuity provider, please refer to [email or web address]. After hours, contact General Neurology

## 2023-08-05 NOTE — Progress Notes (Signed)
 Occupational Therapy Treatment Patient Details Name: Kenneth [name] MRN: [id number] DOB: 1951-01-26 Today's Date: 08/05/2023   History of present illness Kenneth [name] is a 73 y.o. male presents to the hospital for 2-3 history of gradually progressive weakness, was also not eating or drinking well for a few days. Past medical history of stroke, currently on DAPT, dyslipidemia, BPH, prostate cancer, insulin-dependent DM type II, hypertension, cervical stenosis, history of iron deficiency anemia, history of OSA does not wear CPAP, recent history of right ankle strain 2 weeks ago. CT - negative MRI - small acute to subacute infarcts L frontal and parietal lobes and R temporal white matter   OT comments  Pt presenting today with little to no initiation in bed mobility needing +3 Total A to get to EOB, he continues to c/o neck pain despite movement of extremities. Worked with pt to achieve a midline neck posture and upright sitting balance with seated EOB activities, he was not able to maintain a midline cervical position. At this time pt is Max A for ADLs bed level, family reports he is still feeding himself without challenge but has been selective with what he eats. OT to continue following pt to progress pt as able, DC plans updated to skilled rehab <3hrs of inpatient therapy.       If plan is discharge home, recommend the following:  A lot of help with walking and/or transfers;A little help with bathing/dressing/bathroom;Assistance with cooking/housework;Direct supervision/assist for medications management;Assist for transportation;Help with stairs or ramp for entrance   Equipment Recommendations  None recommended by OT    Recommendations for Other Services      Precautions / Restrictions Precautions Precautions: Fall;Other (comment) Precaution/Restrictions Comments: R ankle/ RLE swollen Required Braces or Orthoses: Other Brace Other Brace: R ankle ACE wrap in place Restrictions Weight  Bearing Restrictions Per Provider Order: No       Mobility Bed Mobility Overal bed mobility: Needs Assistance Bed Mobility: Rolling, Supine to Sit, Sit to Supine Rolling: Max assist, Used rails   Supine to sit: Total assist, +2 for safety/equipment, +2 for physical assistance (+3) Sit to supine: Total assist, +2 for physical assistance (+3)   General bed mobility comments: assist for all mobility, no initiation. C/o neck pain with mobility    Transfers Overall transfer level: Needs assistance   Transfers: Sit to/from Stand Sit to Stand: Max assist (+3 physical assist)           General transfer comment: quick stand to reposition on bed, deferred full upright postion as pt not able to sustain with significant ext support     Balance Overall balance assessment: Needs assistance Sitting-balance support: Single extremity supported Sitting balance-Leahy Scale: Poor   Postural control: Right lateral lean                                 ADL either performed or assessed with clinical judgement   ADL       General ADL Comments: worked with pt on sitting balance and seated activity.     Communication Communication Communication: Impaired Factors Affecting Communication: Hearing impaired   Cognition Arousal: Lethargic Behavior During Therapy: Flat affect Cognition: Cognition impaired     Awareness: Intellectual awareness impaired, Online awareness impaired   Attention impairment (select first level of impairment): Sustained attention                     Following  commands: Impaired Following commands impaired: Follows one step commands with increased time, Follows one step commands inconsistently      Cueing   Cueing Techniques: Verbal cues, Tactile cues, Gestural cues  Exercises Other Exercises Other Exercises: Dynamic reaching sitting EOB to promote anterior weight shifts Other Exercises: R lateral elbow propping Other Exercises: Trunk  elongation to Rt side Other Exercises: cervical lateral flex (L) into midline    Shoulder Instructions       General Comments RLE wrapped, Pt spouse present talking to MD Cabbell    Pertinent Vitals/ Pain       Pain Assessment Pain Assessment: Faces Faces Pain Scale: Hurts even more Pain Location: neck, pt continued to c/o neck pain even with movement of BLEs and repositioning Pain Descriptors / Indicators: Discomfort, Grimacing, Moaning, Guarding Pain Intervention(s): Monitored during session, Limited activity within patient's tolerance, Repositioned  Home Living                                          Prior Functioning/Environment              Frequency  Min 2X/week        Progress Toward Goals  OT Goals(current goals can now be found in the care plan section)  Progress towards OT goals: Not progressing toward goals - comment (pt could be limited by pain, processing, etc. No clear picture of what is limiting pt)  Acute Rehab OT Goals Patient Stated Goal: wants to get better OT Goal Formulation: With patient Time For Goal Achievement: 08/17/23 Potential to Achieve Goals: Good  Plan      Co-evaluation                 AM-PAC OT "6 Clicks" Daily Activity     Outcome Measure   Help from another person eating meals?: None Help from another person taking care of personal grooming?: A Little Help from another person toileting, which includes using toliet, bedpan, or urinal?: A Lot Help from another person bathing (including washing, rinsing, drying)?: A Lot Help from another person to put on and taking off regular upper body clothing?: A Lot Help from another person to put on and taking off regular lower body clothing?: A Lot 6 Click Score: 15    End of Session    OT Visit Diagnosis: Unsteadiness on feet (R26.81);Muscle weakness (generalized) (M62.81);Pain Pain - Right/Left: Right Pain - part of body: Ankle and joints of foot    Activity Tolerance Patient limited by lethargy;Patient limited by pain   Patient Left in bed;with call bell/phone within reach;with bed alarm set;with family/visitor present   Nurse Communication Mobility status (R ankle swelling)        Time: 1421-1500 OT Time Calculation (min): 39 min  Charges: OT General Charges $OT Visit: 1 Visit OT Treatments $Therapeutic Activity: 38-52 mins  08/05/2023  AB, OTR/L  Acute Rehabilitation Services  Office: [phone]   Tristan Schroeder 08/05/2023, 4:24 PM

## 2023-08-05 NOTE — Plan of Care (Signed)
  Problem: Education: Goal: Ability to describe self-care measures that may prevent or decrease complications (Diabetes Survival Skills Education) will improve Outcome: Progressing   Problem: Coping: Goal: Ability to adjust to condition or change in health will improve Outcome: Progressing   Problem: Health Behavior/Discharge Planning: Goal: Ability to identify and utilize available resources and services will improve Outcome: Progressing   Problem: Metabolic: Goal: Ability to maintain appropriate glucose levels will improve Outcome: Progressing   Problem: Nutritional: Goal: Maintenance of adequate nutrition will improve Outcome: Progressing   Problem: Skin Integrity: Goal: Risk for impaired skin integrity will decrease Outcome: Progressing   Problem: Education: Goal: Knowledge of General Education information will improve Description: Including pain rating scale, medication(s)/side effects and non-pharmacologic comfort measures Outcome: Progressing

## 2023-08-05 NOTE — Plan of Care (Signed)

## 2023-08-05 NOTE — Consult Note (Signed)
 [facility] Psychiatric Consult Initial  Patient Name: .Kenneth [name]  MRN: [id number]  DOB: 09/30/1950  Consult Order details:  Orders (From admission, onward)     Start     Ordered   08/04/23 0855  IP CONSULT TO PSYCHIATRY       Ordering Provider: Leroy Sea, MD  Provider:  (Not yet assigned)  Question Answer Comment  Location [facility]   Reason for Consult? Flat affect, poor appetite, generalized deconditioning, per family worsening depression.      08/04/23 0855             Mode of Visit: In person    Psychiatry Consult Evaluation  Service Date: August 05, 2023 LOS:  LOS: 4 days  Chief Complaint "I've not been eating"  Primary Psychiatric Diagnoses  Altered mental status 2.  Depression  Assessment  Kenneth [name] is a 73 y.o. male admitted: Medically on 08/01/2023  6:19 AM for  generalized weakness in a patient with gradually progressive anemia of chronic disease, dehydration, hypomagnesemia.  He has no documented past psychiatric history, but prescribed Lexapro as home medication presumably for depression.  He has a past medical history of  stroke, currently on DAPT, dyslipidemia, BPH, prostate cancer, insulin-dependent DM type II, hypertension, cervical stenosis, history of iron deficiency anemia, history of OSA does not wear CPAP, recent history of right ankle strain 2 weeks ago, presents to the hospital for 2-3 history of gradually progressive weakness, was also not eating or drinking well for a few days.   08/04/2023: Met with resident covering neurology service who indicated patient has been diagnosed with a B12 deficiency that may be contributing to his current mental state. His initial presentation of altered mental status is most consistent with her underlying medical condition. He meets criteria for depression based on poor appetite, decreased interest in usual activities, sleeping more than usual, low energy, poor concentration.   Current outpatient psychotropic medications include Lexapro and historically he has had a minimal response to these medications. He was compliant with medications prior to admission as evidenced by patient and wife report. On initial examination, patient is drowsy during assessment.  He will become alert to response, but is disoriented to time.  He provides permission to contact his wife, but is unable to provide her name or phone number.  Patient states that Lexapro has not seem to help his depressed mood.  He is open to a change in antidepressant and reviewed mirtazapine which can help improve sleep and appetite.  He is agreeable, but would like me to review this with his wife.  Discussed patient diagnoses of problem list of obstructive sleep apnea.  Patient states he has not ever used CPAP, but is open to trial of CPAP hospitalized. Patient specifically denies any SI, HI, AVH.  08/05/2023: Patient is seen mid morning.  He is laying in bed, asleep with covers off.  He arouses easily and states that he has been hot.  He describes that he slept well overnight and continues to feel tired.  He believes his appetite is slightly improved, and reports his mood as "okay".  He says it has been over a year since he has experienced a good mood.  Today, patient is oriented to name, but not date.  He continues to have confusion regarding need for hospitalization.  He is however able to recall visitors yesterday and today knows the name of his wife and caregiver and that they visited yesterday.  He  is able to stay more alert during interview today, and is more conversant. Patient specifically denies any SI, HI, AVH.  Please see plan below for detailed recommendations.   Diagnoses:  Active Hospital problems: Principal Problem:   Symptomatic anemia Active Problems:   Weakness   Thrombocytopenia (HCC)   History of stroke    Plan   ## Psychiatric Medication Recommendations:  -- Discontinued Lexapro due to  ineffectiveness for depression management -- Mirtazapine 7.5 mg for depressed mood with side effects of sedation and increasing appetite. -- Nutritional supplementation as recommended by neurology and primary team   ## Medical Decision Making Capacity:  Not specifically addressed at this encounter.  Patient does not have full orientation at time of this interview.  Wife  is able to assist with medical decision making.  ## Further Work-up:  -- EEG 08/04/2023:  mild diffuse encephalopathy. No seizures or epileptiform discharges were seen throughout the recording.  -- Other labs per primary team and neurology. -- Most recent EKG on 08/01/2023 had QtC of 469 -- Pertinent labwork reviewed earlier this admission includes: Mild hyponatremia, elevated blood sugars, normal magnesium, vitamin D and ammonia levels, normal BNP, TSH/T4 within normal limits, CBC with anemia.  Elevated ferritin, normal reticulocyte count, low iron, TIBC and iron saturation ratios, normal folate low B12 (142) RPR nonreactive B1, B6 pending  -- Patient would benefit from workup for obstructive sleep apnea and treatment as indicated, as untreated OSA can contribute to increase metabolic, cardiovascular, memory and mood symptoms/complications.   ## Disposition:-- There are no psychiatric contraindications to discharge at this time  ## Behavioral / Environmental: -Delirium Precautions: Delirium Interventions for Nursing and Staff: - RN to open blinds every AM. - To Bedside: Glasses, hearing aide, and pt's own shoes. Make available to patients. when possible and encourage use. - Encourage po fluids when appropriate, keep fluids within reach. - OOB to chair with meals. - Passive ROM exercises to all extremities with AM & PM care. - RN to assess orientation to person, time and place QAM and PRN. - Recommend extended visitation hours with familiar family/friends as feasible. - Staff to minimize disturbances at night. Turn off television  when pt asleep or when not in use.    ## Safety and Observation Level:  - Based on my clinical evaluation, I estimate the patient to be at low risk of self harm in the current setting. - At this time, we recommend  routine. This decision is based on my review of the chart including patient's history and current presentation, interview of the patient, mental status examination, and consideration of suicide risk including evaluating suicidal ideation, plan, intent, suicidal or self-harm behaviors, risk factors, and protective factors. This judgment is based on our ability to directly address suicide risk, implement suicide prevention strategies, and develop a safety plan while the patient is in the clinical setting. Please contact our team if there is a concern that risk level has changed.  CSSR Risk Category:C-SSRS RISK CATEGORY: No Risk  Suicide Risk Assessment: Patient has following modifiable risk factors for suicide: under treated depression , which we are addressing by medication management. Patient has following non-modifiable or demographic risk factors for suicide: male gender Patient has the following protective factors against suicide: Supportive family, Supportive friends, Cultural, spiritual, or religious beliefs that discourage suicide, no history of suicide attempts, and no history of NSSIB  Thank you for this consult request. Recommendations have been communicated to the primary team.  We will continue to  follow at this time.   Mariel Craft, MD       History of Present Illness  Relevant Aspects of  Hospital Course:  Admitted on 08/01/2023 for altered mental status.   Collateral information:  Collateral obtained from wife and caregiver at bedside at time of reassessment in the afternoon on 08/04/2023: Patient was more alert, and patient and wife described him as improved from yesterday and more talkative. Wife states that patient had been becoming depressed for some time prior  to admission.  She states he ultimately ended up on Mounjaro and Toujeo for management of his diabetes.  Wife states that he did have significant weight loss, but she feels like it is "short circuited his brain".  She states that after starting [facility] patient became more forgetful and depressed.  Patient has since discontinued Mounjaro and his Toujeo dose has been decreased in half. She states that he had always been a picky eater, but after starting Mounjaro, he has lost his appetite and not regained it since discontinuation.   Patient Report:  Patient with improved attention, recall and speech during assessment.  He specifically denies any SI, HI, AVH.  He does endorse depression.  States he slept well and believes his appetite is slowly improving.  He is able to state past 4 presidents.  Oriented to person and place.  Requires prompts to know the date.  Improved recall from yesterday events.  Psych ROS:  Depression: Yes Anxiety: Yes Mania (lifetime and current): None Psychosis: (lifetime and current): None  Review of Systems  Constitutional:  Positive for malaise/fatigue and weight loss.  Respiratory: Negative.    Neurological:  Positive for weakness.  Psychiatric/Behavioral:  Positive for depression and memory loss. Negative for hallucinations, substance abuse and suicidal ideas. The patient is not nervous/anxious and does not have insomnia.      Psychiatric and Social History  Psychiatric History:  Information collected from patient and wife  Prev Dx/Sx: Depression Current Psych Provider: None Home Meds (current): Lexapro Previous Med Trials: Lexapro Therapy: None  Prior Psych Hospitalization: None Prior Self Harm: None Prior Violence: None  Family Psych History: None Family Hx suicide: None  Social History:  Developmental Hx: Not obtained Educational Hx: Not obtained Occupational Hx: Retired Armed forces operational officer Hx: None Living Situation: Lives with wife and caregiver Spiritual  Hx: Not asked Access to weapons/lethal means: Denies   Social History   Socioeconomic History   Marital status: Married    Spouse name: Not on file   Number of children: Not on file   Years of education: Not on file   Highest education level: Not on file  Occupational History   Not on file  Tobacco Use   Smoking status: Former    Current packs/day: 0.00    Average packs/day: 0.3 packs/day for 2.0 years (0.5 ttl pk-yrs)    Types: Cigarettes    Start date: 09/23/1967    Quit date: 09/22/1969    Years since quitting: 53.9   Smokeless tobacco: Never  Vaping Use   Vaping status: Never Used  Substance and Sexual Activity   Alcohol use: No   Drug use: No   Sexual activity: Yes  Other Topics Concern   Not on file  Social History Narrative   Right handed    Wear glasses   Social Drivers of Health   Financial Resource Strain: Not on file  Food Insecurity: No Food Insecurity (08/01/2023)   Hunger Vital Sign    Worried About Running Out  of Food in the Last Year: Never true    Ran Out of Food in the Last Year: Never true  Transportation Needs: No Transportation Needs (08/01/2023)   PRAPARE - Administrator, Civil Service (Medical): No    Lack of Transportation (Non-Medical): No  Physical Activity: Not on file  Stress: Not on file  Social Connections: Socially Isolated (08/01/2023)   Social Connection and Isolation Panel [NHANES]    Frequency of Communication with Friends and Family: Never    Frequency of Social Gatherings with Friends and Family: Never    Attends Religious Services: Never    Database administrator or Organizations: No    Attends Banker Meetings: Never    Marital Status: Married  Catering manager Violence: Not At Risk (08/01/2023)   Humiliation, Afraid, Rape, and Kick questionnaire    Fear of Current or Ex-Partner: No    Emotionally Abused: No    Physically Abused: No    Sexually Abused: No    Substance History Alcohol: None Type  of alcohol N/A Last Drink N/A Number of drinks per day: N/A History of alcohol withdrawal seizures in N/A History of DT's in  Tobacco: Former Illicit drugs: N/A Prescription drug abuse: N/A Rehab hx: N/A  Exam Findings  Physical Exam:  Vital Signs:  Temp:  [97 F (36.1 C)-99.4 F (37.4 C)] 99.4 F (37.4 C) (03/26 0800) Pulse Rate:  [66-80] 67 (03/26 0800) Resp:  [15-21] 19 (03/26 0800) BP: (108-120)/(44-69) 119/69 (03/26 0800) SpO2:  [93 %-96 %] 94 % (03/26 0800) Blood pressure 119/69, pulse 67, temperature 99.4 F (37.4 C), temperature source Axillary, resp. rate 19, height 5\' 11"  (1.803 m), weight 86.6 kg, SpO2 94%. Body mass index is 26.64 kg/m.  Physical Exam  Mental Status Exam: General Appearance: Disheveled  Orientation:  Other:  Person and place  Memory:  Immediate;   Fair Recent;   Fair Remote;   Fair-gradually improving  Concentration:  Concentration: Fair and Attention Span: Fair  Recall:  Fair  Attention  Fair  Eye Contact:  Fair  Speech:  Garbled, but more understandable from 08/04/2023  Language:  Good  Volume:  Decreased  Mood: "Okay"  Affect:  Congruent and Depressed, is easily frustrated when he has a difficult time remembering things.  Thought Process:  Coherent  Thought Content:  Hallucinations: None  Suicidal Thoughts:  No  Homicidal Thoughts:  No  Judgement:  Fair  Insight:  Shallow  Psychomotor Activity:  Decreased  Akathisia:  No  Fund of Knowledge:  Fair      Assets:  Desire for Improvement Resilience Social Support  Cognition:  Impaired,  Moderate  ADL's:  Impaired  AIMS (if indicated):        Other History   These have been pulled in through the EMR, reviewed, and updated if appropriate.  Family History:  The patient's family history includes Diabetes in his mother.  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   History of colon polyps - adenomas 04/03/2010   Hyperlipidemia    Hypertension    Iron deficiency anemia, unspecified    OSA (obstructive sleep apnea)    refusing CPAP   Pneumonia 03/2022   w/effusion   Prostate cancer (  HCC)    Stroke [facility])     Surgical History: Past Surgical History:  Procedure Laterality Date   ANTERIOR CERVICAL DECOMP/DISCECTOMY FUSION N/A 10/01/2012   Procedure: ANTERIOR CERVICAL DECOMPRESSION/DISCECTOMY FUSION 2 LEVELS;  Surgeon: Tia Alert, MD;  Location: [facility] NEURO ORS;  Service: Neurosurgery;  Laterality: N/A;  Cervical five-six,Cervical six-seven   COLONOSCOPY  09/30/2010, 03/01/2014   diverticulosis, small internal hemorrhoids   COLONOSCOPY W/ POLYPECTOMY  04/03/2010   4 polyps 8-11mm, worst TV adenoma with high-grade dysplasia   ESOPHAGOGASTRODUODENOSCOPY N/A 08/02/2023   Procedure: EGD (ESOPHAGOGASTRODUODENOSCOPY);  Surgeon: [name], MD;  Location: [facility] ENDOSCOPY;  Service: Gastroenterology;  Laterality: N/A;   LOOP RECORDER INSERTION N/A 10/10/2022   Procedure: LOOP RECORDER INSERTION;  Surgeon: Marinus Maw, MD;  Location: [facility] INVASIVE CV LAB;  Service: Cardiovascular;  Laterality: N/A;   NO PAST SURGERIES     POLYPECTOMY     POSTERIOR CERVICAL FUSION/FORAMINOTOMY Left 10/20/2012   Procedure: C/5-6,C/6-7 Lami/Multi level,POSTERIOR CERVICAL FUSION/FORAMINOTOMY C/4-7;  Surgeon: Tia Alert, MD;  Location: [facility] NEURO ORS;  Service: Neurosurgery;  Laterality: Left;  Cervical Five-Six, Cervical Six-Seven Laminectomies, Posterior Cervical Fusion/Foraminotomies Cervical Four through Seven.    TONSILLECTOMY       Medications:   Current Facility-Administered Medications:    acetaminophen (TYLENOL) tablet 650 mg, 650 mg, Oral, Q6H PRN, 650 mg at 08/05/23 0950 **OR** acetaminophen (TYLENOL) suppository 650 mg, 650 mg, Rectal, Q6H PRN, Leroy Sea, MD    albuterol (PROVENTIL) (2.5 MG/3ML) 0.083% nebulizer solution 2.5 mg, 2.5 mg, Nebulization, Q6H PRN, Leroy Sea, MD   aspirin chewable tablet 81 mg, 81 mg, Oral, Daily, Leroy Sea, MD, 81 mg at 08/05/23 0950   atorvastatin (LIPITOR) tablet 40 mg, 40 mg, Oral, QHS, Leroy Sea, MD, 40 mg at 08/04/23 2121   bisacodyl (DULCOLAX) EC tablet 5 mg, 5 mg, Oral, Daily PRN, Leroy Sea, MD   cyanocobalamin (VITAMIN B12) tablet 1,000 mcg, 1,000 mcg, Oral, Daily, Leroy Sea, MD, 1,000 mcg at 08/05/23 0950   ezetimibe (ZETIA) tablet 10 mg, 10 mg, Oral, Daily, Leroy Sea, MD, 10 mg at 08/05/23 0950   ferrous sulfate tablet 325 mg, 325 mg, Oral, BID WC, Leroy Sea, MD, 325 mg at 08/05/23 0981   folic acid (FOLVITE) tablet 1 mg, 1 mg, Oral, Daily, Leroy Sea, MD, 1 mg at 08/05/23 0950   insulin aspart (novoLOG) injection 0-15 Units, 0-15 Units, Subcutaneous, TID WC, Leroy Sea, MD, 2 Units at 08/05/23 1914   insulin aspart (novoLOG) injection 0-5 Units, 0-5 Units, Subcutaneous, QHS, Singh, Stanford Scotland, MD   insulin glargine-yfgn (SEMGLEE) injection 30 Units, 30 Units, Subcutaneous, Q1400, Leroy Sea, MD, 30 Units at 08/04/23 1250   megestrol (MEGACE) tablet 40 mg, 40 mg, Oral, Daily, Leroy Sea, MD, 40 mg at 08/05/23 7829   menthol-cetylpyridinium (CEPACOL) lozenge 3 mg, 1 lozenge, Oral, PRN, Howerter, Justin B, DO   [DISCONTINUED] ondansetron (ZOFRAN) tablet 4 mg, 4 mg, Oral, Q6H PRN **OR** ondansetron (ZOFRAN) injection 4 mg, 4 mg, Intravenous, Q6H PRN, Thedore Mins, Stanford Scotland, MD   pantoprazole (PROTONIX) EC tablet 40 mg, 40 mg, Oral, BID AC, Leroy Sea, MD, 40 mg at 08/05/23 0950   phenol (CHLORASEPTIC) mouth spray 1 spray, 1 spray, Mouth/Throat, PRN, Howerter, Justin B, DO, 1 spray at 08/04/23 2123   tamsulosin (FLOMAX) capsule 0.4 mg, 0.4 mg, Oral, QPM, Leroy Sea, MD, 0.4 mg at 08/03/23 1653   thiamine (  VITAMIN B1) tablet 100  mg, 100 mg, Oral, q AM, Tomie China, MD, 100 mg at 08/05/23 0102   ticagrelor (BRILINTA) tablet 90 mg, 90 mg, Oral, BID, Leroy Sea, MD, 90 mg at 08/05/23 0950  Allergies: Allergies  Allergen Reactions   Jardiance [Empagliflozin] Other (See Comments)    To the hospital- due to complications   Fluoxetine Other (See Comments)    Prefers to NOT take this   Penicillins Other (See Comments)    CHILDHOOD ALLERGY- exact reaction? Has patient had a PCN reaction causing immediate rash, facial/tongue/throat swelling, SOB or lightheadedness with hypotension: Unknown Has patient had a PCN reaction causing severe rash involving mucus membranes or skin necrosis: Unknown Has patient had a PCN reaction that required hospitalization: Unknown Has patient had a PCN reaction occurring within the last 10 years: Unknown If all of the above answers are "NO", then may proceed with Cephalosporin use.     Silodosin Other (See Comments)    Vertigo     Mariel Craft, MD

## 2023-08-06 ENCOUNTER — Inpatient Hospital Stay (HOSPITAL_COMMUNITY)

## 2023-08-06 DIAGNOSIS — I6389 Other cerebral infarction: Secondary | ICD-10-CM

## 2023-08-06 DIAGNOSIS — R531 Weakness: Secondary | ICD-10-CM | POA: Diagnosis not present

## 2023-08-06 DIAGNOSIS — R4182 Altered mental status, unspecified: Secondary | ICD-10-CM | POA: Diagnosis not present

## 2023-08-06 DIAGNOSIS — D6859 Other primary thrombophilia: Secondary | ICD-10-CM | POA: Diagnosis not present

## 2023-08-06 DIAGNOSIS — D649 Anemia, unspecified: Secondary | ICD-10-CM | POA: Diagnosis not present

## 2023-08-06 DIAGNOSIS — Z01810 Encounter for preprocedural cardiovascular examination: Secondary | ICD-10-CM

## 2023-08-06 DIAGNOSIS — Z8673 Personal history of transient ischemic attack (TIA), and cerebral infarction without residual deficits: Secondary | ICD-10-CM

## 2023-08-06 LAB — CBC WITH DIFFERENTIAL/PLATELET
Abs Immature Granulocytes: 0.09 10*3/uL — ABNORMAL HIGH (ref 0.00–0.07)
Basophils Absolute: 0.1 10*3/uL (ref 0.0–0.1)
Basophils Relative: 1 %
Eosinophils Absolute: 0 10*3/uL (ref 0.0–0.5)
Eosinophils Relative: 0 %
HCT: 25.6 % — ABNORMAL LOW (ref 39.0–52.0)
Hemoglobin: 8.9 g/dL — ABNORMAL LOW (ref 13.0–17.0)
Immature Granulocytes: 1 %
Lymphocytes Relative: 12 %
Lymphs Abs: 1.1 10*3/uL (ref 0.7–4.0)
MCH: 29.7 pg (ref 26.0–34.0)
MCHC: 34.8 g/dL (ref 30.0–36.0)
MCV: 85.3 fL (ref 80.0–100.0)
Monocytes Absolute: 2.2 10*3/uL — ABNORMAL HIGH (ref 0.1–1.0)
Monocytes Relative: 24 %
Neutro Abs: 5.7 10*3/uL (ref 1.7–7.7)
Neutrophils Relative %: 62 %
Platelets: 97 10*3/uL — ABNORMAL LOW (ref 150–400)
RBC: 3 MIL/uL — ABNORMAL LOW (ref 4.22–5.81)
RDW: 15.2 % (ref 11.5–15.5)
WBC: 9.2 10*3/uL (ref 4.0–10.5)
nRBC: 0 % (ref 0.0–0.2)

## 2023-08-06 LAB — ECHOCARDIOGRAM COMPLETE
AR max vel: 2.16 cm2
AV Area VTI: 2.56 cm2
AV Area mean vel: 2.48 cm2
AV Mean grad: 9 mmHg
AV Peak grad: 19.2 mmHg
Ao pk vel: 2.19 m/s
Area-P 1/2: 3.08 cm2
Calc EF: 61.3 %
Height: 71 in
S' Lateral: 3.5 cm
Single Plane A2C EF: 51.5 %
Single Plane A4C EF: 64.3 %
Weight: 3056 [oz_av]

## 2023-08-06 LAB — COMPREHENSIVE METABOLIC PANEL WITH GFR
ALT: 186 U/L — ABNORMAL HIGH (ref 0–44)
AST: 206 U/L — ABNORMAL HIGH (ref 15–41)
Albumin: 2.1 g/dL — ABNORMAL LOW (ref 3.5–5.0)
Alkaline Phosphatase: 142 U/L — ABNORMAL HIGH (ref 38–126)
Anion gap: 7 (ref 5–15)
BUN: 32 mg/dL — ABNORMAL HIGH (ref 8–23)
CO2: 25 mmol/L (ref 22–32)
Calcium: 8.1 mg/dL — ABNORMAL LOW (ref 8.9–10.3)
Chloride: 102 mmol/L (ref 98–111)
Creatinine, Ser: 0.96 mg/dL (ref 0.61–1.24)
GFR, Estimated: >60 mL/min (ref >60)
Glucose, Bld: 140 mg/dL — ABNORMAL HIGH (ref 70–99)
Potassium: 3.6 mmol/L (ref 3.5–5.1)
Sodium: 134 mmol/L — ABNORMAL LOW (ref 135–145)
Total Bilirubin: 1.5 mg/dL — ABNORMAL HIGH (ref 0.0–1.2)
Total Protein: 4.5 g/dL — ABNORMAL LOW (ref 6.5–8.1)

## 2023-08-06 LAB — CBC
HCT: 24.9 % — ABNORMAL LOW (ref 39.0–52.0)
Hemoglobin: 8.7 g/dL — ABNORMAL LOW (ref 13.0–17.0)
MCH: 29.7 pg (ref 26.0–34.0)
MCHC: 34.9 g/dL (ref 30.0–36.0)
MCV: 85 fL (ref 80.0–100.0)
Platelets: 95 10*3/uL — ABNORMAL LOW (ref 150–400)
RBC: 2.93 MIL/uL — ABNORMAL LOW (ref 4.22–5.81)
RDW: 15.2 % (ref 11.5–15.5)
WBC: 9.8 10*3/uL (ref 4.0–10.5)
nRBC: 0 % (ref 0.0–0.2)

## 2023-08-06 LAB — BASIC METABOLIC PANEL WITH GFR
Anion gap: 7 (ref 5–15)
BUN: 31 mg/dL — ABNORMAL HIGH (ref 8–23)
CO2: 23 mmol/L (ref 22–32)
Calcium: 7.7 mg/dL — ABNORMAL LOW (ref 8.9–10.3)
Chloride: 102 mmol/L (ref 98–111)
Creatinine, Ser: 0.94 mg/dL (ref 0.61–1.24)
GFR, Estimated: >60 mL/min (ref >60)
Glucose, Bld: 157 mg/dL — ABNORMAL HIGH (ref 70–99)
Potassium: 3.6 mmol/L (ref 3.5–5.1)
Sodium: 132 mmol/L — ABNORMAL LOW (ref 135–145)

## 2023-08-06 LAB — HEPATITIS PANEL, ACUTE
HCV Ab: NONREACTIVE
Hep A IgM: NONREACTIVE
Hep B C IgM: NONREACTIVE
Hepatitis B Surface Ag: NONREACTIVE

## 2023-08-06 LAB — PROCALCITONIN: Procalcitonin: 0.65 ng/mL

## 2023-08-06 LAB — GLUCOSE, CAPILLARY
Glucose-Capillary: 138 mg/dL — ABNORMAL HIGH (ref 70–99)
Glucose-Capillary: 136 mg/dL — ABNORMAL HIGH (ref 70–99)
Glucose-Capillary: 167 mg/dL — ABNORMAL HIGH (ref 70–99)
Glucose-Capillary: 225 mg/dL — ABNORMAL HIGH (ref 70–99)

## 2023-08-06 LAB — C-REACTIVE PROTEIN: CRP: 12.6 mg/dL — ABNORMAL HIGH (ref <1.0)

## 2023-08-06 LAB — BRAIN NATRIURETIC PEPTIDE: B Natriuretic Peptide: 31.9 pg/mL (ref 0.0–100.0)

## 2023-08-06 LAB — MAGNESIUM
Magnesium: 1.8 mg/dL (ref 1.7–2.4)
Magnesium: 1.8 mg/dL (ref 1.7–2.4)

## 2023-08-06 MED ORDER — LACTATED RINGERS IV SOLN: INTRAVENOUS | Status: DC

## 2023-08-06 MED ORDER — LACTATED RINGERS IV BOLUS
500.0000 mL | Freq: Once | INTRAVENOUS | Status: AC
  Administered 2023-08-06: 500 mL via INTRAVENOUS

## 2023-08-06 MED ORDER — GERHARDT'S BUTT CREAM
TOPICAL_CREAM | Freq: Two times a day (BID) | CUTANEOUS | Status: DC
Start: 2023-08-06 — End: 2023-08-09
  Filled 2023-08-06: qty 60

## 2023-08-06 MED ORDER — IOHEXOL 350 MG/ML SOLN
75.0000 mL | Freq: Once | INTRAVENOUS | Status: AC | PRN
  Administered 2023-08-06: 75 mL via INTRAVENOUS

## 2023-08-06 MED ORDER — HEPARIN SODIUM (PORCINE) 5000 UNIT/ML IJ SOLN
5000.0000 [IU] | Freq: Three times a day (TID) | INTRAMUSCULAR | Status: DC
  Administered 2023-08-06 – 2023-08-09 (×8): 5000 [IU] via SUBCUTANEOUS
  Filled 2023-08-06 (×8): qty 1

## 2023-08-06 MED ORDER — VENLAFAXINE HCL ER 37.5 MG PO CP24
37.5000 mg | ORAL_CAPSULE | Freq: Every day | ORAL | Status: DC
  Administered 2023-08-06: 37.5 mg via ORAL
  Filled 2023-08-06 (×3): qty 1

## 2023-08-06 MED ORDER — PERFLUTREN LIPID MICROSPHERE
1.0000 mL | INTRAVENOUS | Status: AC | PRN
  Administered 2023-08-06: 2 mL via INTRAVENOUS

## 2023-08-06 NOTE — Consult Note (Addendum)
 [facility] Psychiatric Consult Follow-up  Patient Name: .Kenneth [name]  MRN: [id number]  DOB: 02/04/1951  Consult Order details:  Orders (From admission, onward)     Start     Ordered   08/04/23 0855  IP CONSULT TO PSYCHIATRY       Ordering Provider: Leroy Sea, MD  Provider:  (Not yet assigned)  Question Answer Comment  Location [facility]   Reason for Consult? Flat affect, poor appetite, generalized deconditioning, per family worsening depression.      08/04/23 0855             Mode of Visit: In person    Psychiatry Consult Evaluation  Service Date: August 06, 2023 LOS:  LOS: 5 days  Chief Complaint Altered Mental Status  Subjective:  "I'm OK"  Primary Psychiatric Diagnoses  Altered mental status 2.  Depression  Assessment  Kenneth [name] is a 73 y.o. male admitted: Medically on 08/01/2023  6:19 AM for  generalized weakness in a patient with gradually progressive anemia of chronic disease, dehydration, hypomagnesemia.  He has no documented past psychiatric history, but prescribed Lexapro as home medication presumably for depression.  He has a past medical history of  stroke, currently on DAPT, dyslipidemia, BPH, prostate cancer, insulin-dependent DM type II, hypertension, cervical stenosis, history of iron deficiency anemia, history of OSA does not wear CPAP, recent history of right ankle strain 2 weeks ago, presents to the hospital for 2-3 history of gradually progressive weakness, was also not eating or drinking well for a few days.   08/04/2023: Met with resident covering neurology service who indicated patient has been diagnosed with a B12 deficiency that may be contributing to his current mental state. His initial presentation of altered mental status is most consistent with her underlying medical condition. He meets criteria for depression based on poor appetite, decreased interest in usual activities, sleeping more than usual, low energy,  poor concentration.  Current outpatient psychotropic medications include Lexapro and historically he has had a minimal response to these medications. He was compliant with medications prior to admission as evidenced by patient and wife report. On initial examination, patient is drowsy during assessment.  He will become alert to response, but is disoriented to time.  He provides permission to contact his wife, but is unable to provide her name or phone number.  Patient states that Lexapro has not seem to help his depressed mood.  He is open to a change in antidepressant and reviewed mirtazapine which can help improve sleep and appetite.  He is agreeable, but would like me to review this with his wife.  Discussed patient diagnoses of problem list of obstructive sleep apnea.  Patient states he has not ever used CPAP, but is open to trial of CPAP hospitalized. Patient specifically denies any SI, HI, AVH.  08/05/2023: Patient is seen mid morning.  He is laying in bed, asleep with covers off.  He arouses easily and states that he has been hot.  He describes that he slept well overnight and continues to feel tired.  He believes his appetite is slightly improved, and reports his mood as "okay".  He says it has been over a year since he has experienced a good mood.  Today, patient is oriented to name, but not date.  He continues to have confusion regarding need for hospitalization.  He is however able to recall visitors yesterday and today knows the name of his wife and caregiver and that they  visited yesterday.  He is able to stay more alert during interview today, and is more conversant. Patient specifically denies any SI, HI, AVH.  08/06/2023:  Attempted to see patient this morning, however he with CT scanner for chest and abdominal CT.  Upon recheck, he is in his room having just completed echocardiogram.  He is fatigued, but smiles and recognizes this Clinical research associate.  His lunch tray has arrived, and when shown the food and  asked if he wanted to eat, he said yes.  He is weak, but able to feed himself.  He states that he slept well and his appetite is improving.  Interestingly, he has not yet received a dose of mirtazapine 7.5 mg at bedtime despite orders placed.  Patient too fatigued to fully participate in assessment.  Collateral call to wife, Kerin Ransom (phone [phone]): When she came to see him today, he had spilled food on himself.  After she arrived, he ate 1/2 a cookie, able to eat some soup and sandwich. Wife states that she feels patient seems worse today than yesterday.  She recalls personal experience with mirtazapine, and states concerned that it may not be effective for patient.  She is agreeable to a trial of a more activating antidepressant.  Wife states that other than Lexapro, patient has been not been on other antidepressants historically.  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    Please see plan below for detailed recommendations.   Diagnoses:  Active Hospital problems: Principal Problem:   Symptomatic anemia Active Problems:   Weakness   Thrombocytopenia (HCC)   History of stroke    Plan   ## Psychiatric Medication Recommendations:  -- Discontinued Lexapro due to ineffectiveness for depression management -- Mirtazapine 7.5 mg for depressed mood with side effects of sedation and increasing appetite-  patient has not yet received a dose. --Start venlafaxine 37.5 mg on 08/06/2023 and increase gradually as patient tolerates for depression management. -- Nutritional supplementation as recommended by neurology and primary team  ## Medical Decision Making Capacity:  Not specifically addressed at this encounter.  Patient does not have full orientation at time of this interview.  Wife  is able to assist with medical decision making.  ## Further Work-up:  -- EEG 08/04/2023:  mild diffuse encephalopathy. No seizures or epileptiform  discharges were seen throughout the recording.  -- Other labs per primary team and neurology. -- Most recent EKG on 08/01/2023 had QtC of 469 -- Pertinent labwork reviewed earlier this admission includes: Mild hyponatremia, elevated blood sugars, normal magnesium, vitamin D and ammonia levels, normal BNP, TSH/T4 within normal limits, CBC with anemia.  Elevated ferritin, normal reticulocyte count, low iron, TIBC and iron saturation ratios, normal folate low B12 (142) RPR nonreactive B1, B6 pending  -- CT Abdomen, Chest completed 08/06/2023: pending  -- ECHO cardiogram 08/06/2023: Left Ventricle: Left ventricular ejection fraction, by estimation, is 60 to 65% . The left ventricle has normal function. The left ventricle has no regional wall motion abnormalities. Definity contrast agent was given IV to delineate the left ventricular endocardial borders. The left ventricular internal cavity size was normal in size. There is no left ventricular hypertrophy. Left ventricular diastolic parameters are consistent with Grade I diastolic dysfunction ( impaired relaxation) . Right Ventricle: The right ventricular size is normal. No increase in right ventricular wall thickness. Right ventricular systolic function is normal. Tricuspid regurgitation signal is inadequate for assessing PA pressure. Left Atrium: Left  atrial size was normal in size. Right Atrium: Right atrial size was normal in size. Pericardium: There is no evidence of pericardial effusion. Mitral Valve: The mitral valve was not well visualized. No evidence of mitral valve regurgitation. No evidence of mitral valve stenosis. Tricuspid Valve: The tricuspid valve is not well visualized. Tricuspid valve regurgitation is not demonstrated. No evidence of tricuspid stenosis. Aortic Valve: The aortic valve was not well visualized. There is mild calcification of the aortic valve. Aortic valve regurgitation is not visualized. Aortic valve sclerosis is present,  with no evidence of aortic valve stenosis. Aortic valve mean gradient measures 9. 0 mmHg. Aortic valve peak gradient measures 19. 2 mmHg. Aortic valve area, by VTI measures 2. 56 cm . Pulmonic Valve: The pulmonic valve was normal in structure. Pulmonic valve regurgitation is not visualized. No evidence of pulmonic stenosis. Aorta: The aortic root is normal in size and structure. Venous: The inferior vena cava is normal in size with greater than 50% respiratory variability, suggesting right atrial pressure of 3 mmHg. IAS/ Shunts: No atrial level shunt detected by color flow Doppler.   -- Patient would benefit from workup for obstructive sleep apnea and treatment as indicated, as untreated OSA can contribute to increase metabolic, cardiovascular, memory and mood symptoms/complications.   ## Disposition:-- There are no psychiatric contraindications to discharge at this time  ## Behavioral / Environmental: -Delirium Precautions: Delirium Interventions for Nursing and Staff: - RN to open blinds every AM. - To Bedside: Glasses, hearing aide, and pt's own shoes. Make available to patients. when possible and encourage use. - Encourage po fluids when appropriate, keep fluids within reach. - OOB to chair with meals. - Passive ROM exercises to all extremities with AM & PM care. - RN to assess orientation to person, time and place QAM and PRN. - Recommend extended visitation hours with familiar family/friends as feasible. - Staff to minimize disturbances at night. Turn off television when pt asleep or when not in use.    ## Safety and Observation Level:  - Based on my clinical evaluation, I estimate the patient to be at low risk of self harm in the current setting. - At this time, we recommend  routine. This decision is based on my review of the chart including patient's history and current presentation, interview of the patient, mental status examination, and consideration of suicide risk including evaluating  suicidal ideation, plan, intent, suicidal or self-harm behaviors, risk factors, and protective factors. This judgment is based on our ability to directly address suicide risk, implement suicide prevention strategies, and develop a safety plan while the patient is in the clinical setting. Please contact our team if there is a concern that risk level has changed.  CSSR Risk Category:C-SSRS RISK CATEGORY: No Risk  Suicide Risk Assessment: Patient has following modifiable risk factors for suicide: under treated depression , which we are addressing by medication management. Patient has following non-modifiable or demographic risk factors for suicide: male gender Patient has the following protective factors against suicide: Supportive family, Supportive friends, Cultural, spiritual, or religious beliefs that discourage suicide, no history of suicide attempts, and no history of NSSIB  Thank you for this consult request. Recommendations have been communicated to the primary team.  We will continue to follow at this time for ongoing medication management.   Mariel Craft, MD       History of Present Illness  Relevant Aspects of  Hospital Course:  Admitted on 08/01/2023 for altered mental status.  Collateral information:  Collateral obtained from wife and caregiver at bedside at time of reassessment in the afternoon on 08/04/2023: Patient was more alert, and patient and wife described him as improved from yesterday and more talkative. Wife states that patient had been becoming depressed for some time prior to admission.  She states he ultimately ended up on Mounjaro and Toujeo for management of his diabetes.  Wife states that he did have significant weight loss, but she feels like it is "short circuited his brain".  She states that after starting [facility] patient became more forgetful and depressed.  Patient has since discontinued Mounjaro and his Toujeo dose has been decreased in half. She states that  he had always been a picky eater, but after starting Mounjaro, he has lost his appetite and not regained it since discontinuation.   Patient Report:  Patient fatigued and disoriented.  He does report good sleep and slight improved appetite. Overall status has worsened from yesterday.  Wife concurs.  Possibly increase fatigue from multiple procedures this AM.   Wife agreeable to medication changes for depression.   Psych ROS:  Depression: Yes Anxiety: Yes Mania (lifetime and current): None Psychosis: (lifetime and current): None  Review of Systems  Constitutional:  Positive for malaise/fatigue and weight loss.  Respiratory: Negative.    Neurological:  Positive for weakness.  Psychiatric/Behavioral:  Positive for depression and memory loss. Negative for hallucinations, substance abuse and suicidal ideas. The patient is not nervous/anxious and does not have insomnia.      Psychiatric and Social History  Psychiatric History:  Information collected from patient and wife  Prev Dx/Sx: Depression Current Psych Provider: None Home Meds (current): Lexapro Previous Med Trials: Lexapro Therapy: None  Prior Psych Hospitalization: None Prior Self Harm: None Prior Violence: None  Family Psych History: None Family Hx suicide: None  Social History:  Developmental Hx: Not obtained Educational Hx: Not obtained Occupational Hx: Retired Armed forces operational officer Hx: None Living Situation: Lives with wife and caregiver Spiritual Hx: Not asked Access to weapons/lethal means: Denies   Social History   Socioeconomic History   Marital status: Married    Spouse name: Not on file   Number of children: Not on file   Years of education: Not on file   Highest education level: Not on file  Occupational History   Not on file  Tobacco Use   Smoking status: Former    Current packs/day: 0.00    Average packs/day: 0.3 packs/day for 2.0 years (0.5 ttl pk-yrs)    Types: Cigarettes    Start date: 09/23/1967     Quit date: 09/22/1969    Years since quitting: 53.9   Smokeless tobacco: Never  Vaping Use   Vaping status: Never Used  Substance and Sexual Activity   Alcohol use: No   Drug use: No   Sexual activity: Yes  Other Topics Concern   Not on file  Social History Narrative   Right handed    Wear glasses   Social Drivers of Health   Financial Resource Strain: Not on file  Food Insecurity: No Food Insecurity (08/01/2023)   Hunger Vital Sign    Worried About Running Out of Food in the Last Year: Never true    Ran Out of Food in the Last Year: Never true  Transportation Needs: No Transportation Needs (08/01/2023)   PRAPARE - Administrator, Civil Service (Medical): No    Lack of Transportation (Non-Medical): No  Physical Activity: Not on file  Stress:  Not on file  Social Connections: Socially Isolated (08/01/2023)   Social Connection and Isolation Panel [NHANES]    Frequency of Communication with Friends and Family: Never    Frequency of Social Gatherings with Friends and Family: Never    Attends Religious Services: Never    Database administrator or Organizations: No    Attends Banker Meetings: Never    Marital Status: Married  Catering manager Violence: Not At Risk (08/01/2023)   Humiliation, Afraid, Rape, and Kick questionnaire    Fear of Current or Ex-Partner: No    Emotionally Abused: No    Physically Abused: No    Sexually Abused: No    Substance History Alcohol: None Type of alcohol N/A Last Drink N/A Number of drinks per day: N/A History of alcohol withdrawal seizures in N/A History of DT's in  Tobacco: Former Illicit drugs: N/A Prescription drug abuse: N/A Rehab hx: N/A  Exam Findings  Physical Exam:  Vital Signs:  Temp:  [97.6 F (36.4 C)-98.1 F (36.7 C)] 98.1 F (36.7 C) (03/27 0723) Pulse Rate:  [68-81] 68 (03/27 0723) Resp:  [18-21] 21 (03/27 0723) BP: (110-134)/(55-70) 110/67 (03/27 0723) SpO2:  [92 %-97 %] 95 % (03/27  0723) Blood pressure 110/67, pulse 68, temperature 98.1 F (36.7 C), temperature source Oral, resp. rate (!) 21, height 5\' 11"  (1.803 m), weight 86.6 kg, SpO2 95%. Body mass index is 26.64 kg/m.  Physical Exam  Mental Status Exam: General Appearance: Disheveled  Orientation:  Negative, name only  Memory:  Immediate;   Poor Recent;   Poor Remote;   Poor  Concentration:  Concentration: Fair and Attention Span: Poor  Recall:  Poor  Attention  Poor  Eye Contact:  Fair  Speech:  Garbled  Language:  Fair  Volume:  Decreased  Mood: "Okay"  Affect:  Constricted and Depressed  Thought Process:  Disorganized  Thought Content:  Hallucinations: None  Suicidal Thoughts:  No  Homicidal Thoughts:  No  Judgement:  Fair  Insight:  Shallow  Psychomotor Activity:  Decreased  Akathisia:  No  Fund of Knowledge:  Fair      Assets:  Desire for Improvement Resilience Social Support  Cognition:  Impaired,  Moderate  ADL's:  Impaired  AIMS (if indicated):        Other History   These have been pulled in through the EMR, reviewed, and updated if appropriate.  Family History:  The patient's family history includes Diabetes in his mother.  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   History of colon polyps - adenomas 04/03/2010   Hyperlipidemia    Hypertension    Iron deficiency anemia, unspecified    OSA (obstructive sleep apnea)    refusing CPAP   Pneumonia 03/2022   w/effusion   Prostate cancer (HCC)    Stroke [facility])     Surgical History: Past Surgical History:  Procedure Laterality Date   ANTERIOR CERVICAL DECOMP/DISCECTOMY FUSION N/A 10/01/2012   Procedure: ANTERIOR CERVICAL DECOMPRESSION/DISCECTOMY FUSION 2 LEVELS;  Surgeon: Tia Alert, MD;  Location: [facility] NEURO ORS;   Service: Neurosurgery;  Laterality: N/A;  Cervical five-six,Cervical six-seven   COLONOSCOPY  09/30/2010, 03/01/2014   diverticulosis, small internal hemorrhoids   COLONOSCOPY W/ POLYPECTOMY  04/03/2010   4 polyps 8-110mm, worst TV adenoma with high-grade dysplasia   ESOPHAGOGASTRODUODENOSCOPY N/A 08/02/2023   Procedure: EGD (ESOPHAGOGASTRODUODENOSCOPY);  Surgeon: [name], MD;  Location: [facility] ENDOSCOPY;  Service: Gastroenterology;  Laterality: N/A;   LOOP RECORDER INSERTION N/A 10/10/2022   Procedure: LOOP RECORDER INSERTION;  Surgeon: Marinus Maw, MD;  Location: [facility] INVASIVE CV LAB;  Service: Cardiovascular;  Laterality: N/A;   NO PAST SURGERIES     POLYPECTOMY     POSTERIOR CERVICAL FUSION/FORAMINOTOMY Left 10/20/2012   Procedure: C/5-6,C/6-7 Lami/Multi level,POSTERIOR CERVICAL FUSION/FORAMINOTOMY C/4-7;  Surgeon: Tia Alert, MD;  Location: [facility] NEURO ORS;  Service: Neurosurgery;  Laterality: Left;  Cervical Five-Six, Cervical Six-Seven Laminectomies, Posterior Cervical Fusion/Foraminotomies Cervical Four through Seven.    TONSILLECTOMY       Medications:   Current Facility-Administered Medications:    acetaminophen (TYLENOL) tablet 650 mg, 650 mg, Oral, Q6H PRN, 650 mg at 08/05/23 0950 **OR** acetaminophen (TYLENOL) suppository 650 mg, 650 mg, Rectal, Q6H PRN, Leroy Sea, MD   albuterol (PROVENTIL) (2.5 MG/3ML) 0.083% nebulizer solution 2.5 mg, 2.5 mg, Nebulization, Q6H PRN, Leroy Sea, MD   aspirin chewable tablet 81 mg, 81 mg, Oral, Daily, Leroy Sea, MD, 81 mg at 08/06/23 0817   atorvastatin (LIPITOR) tablet 40 mg, 40 mg, Oral, QHS, Leroy Sea, MD, 40 mg at 08/06/23 0020   bisacodyl (DULCOLAX) EC tablet 5 mg, 5 mg, Oral, Daily PRN, Leroy Sea, MD   cyanocobalamin (VITAMIN B12) tablet 1,000 mcg, 1,000 mcg, Oral, Daily, Leroy Sea, MD, 1,000 mcg at 08/06/23 4098   ezetimibe (ZETIA) tablet 10 mg, 10 mg, Oral, Daily, Leroy Sea,  MD, 10 mg at 08/06/23 1191   ferrous sulfate tablet 325 mg, 325 mg, Oral, BID WC, Leroy Sea, MD, 325 mg at 08/06/23 4782   folic acid (FOLVITE) tablet 1 mg, 1 mg, Oral, Daily, Leroy Sea, MD, 1 mg at 08/06/23 9562   Gerhardt's butt cream, , Topical, BID, Leroy Sea, MD, Given at 08/06/23 0818   insulin aspart (novoLOG) injection 0-15 Units, 0-15 Units, Subcutaneous, TID WC, Leroy Sea, MD, 2 Units at 08/06/23 0818   insulin aspart (novoLOG) injection 0-5 Units, 0-5 Units, Subcutaneous, QHS, Singh, Stanford Scotland, MD   insulin glargine-yfgn (SEMGLEE) injection 30 Units, 30 Units, Subcutaneous, Q1400, Leroy Sea, MD, 30 Units at 08/05/23 1240   lactated ringers infusion, , Intravenous, Continuous, Leroy Sea, MD, Last Rate: 75 mL/hr at 08/06/23 0023, New Bag at 08/06/23 0023   megestrol (MEGACE) tablet 40 mg, 40 mg, Oral, Daily, Leroy Sea, MD, 40 mg at 08/06/23 1308   menthol-cetylpyridinium (CEPACOL) lozenge 3 mg, 1 lozenge, Oral, PRN, Howerter, Justin B, DO   mirtazapine (REMERON) tablet 7.5 mg, 7.5 mg, Oral, QHS, Singh, Stanford Scotland, MD   [DISCONTINUED] ondansetron (ZOFRAN) tablet 4 mg, 4 mg, Oral, Q6H PRN **OR** ondansetron (ZOFRAN) injection 4 mg, 4 mg, Intravenous, Q6H PRN, Thedore Mins, Stanford Scotland, MD   pantoprazole (PROTONIX) EC tablet 40 mg, 40 mg, Oral, BID AC, Leroy Sea, MD, 40 mg at 08/06/23 6578   perflutren lipid microspheres (DEFINITY) IV suspension, 1-10 mL, Intravenous, PRN, [name], MD, 2 mL at 08/06/23 1218   phenol (CHLORASEPTIC) mouth spray 1 spray, 1 spray, Mouth/Throat, PRN, Howerter, Justin B, DO, 1 spray at 08/04/23 2123   tamsulosin (FLOMAX) capsule 0.4 mg, 0.4 mg, Oral, QPM, Leroy Sea, MD, 0.4 mg at 08/05/23 1717   thiamine (  VITAMIN B1) tablet 100 mg, 100 mg, Oral, q AM, Tomie China, MD, 100 mg at 08/06/23 1610  Allergies: Allergies  Allergen Reactions   Jardiance [Empagliflozin] Other (See Comments)     To the hospital- due to complications   Fluoxetine Other (See Comments)    Prefers to NOT take this   Penicillins Other (See Comments)    CHILDHOOD ALLERGY- exact reaction? Has patient had a PCN reaction causing immediate rash, facial/tongue/throat swelling, SOB or lightheadedness with hypotension: Unknown Has patient had a PCN reaction causing severe rash involving mucus membranes or skin necrosis: Unknown Has patient had a PCN reaction that required hospitalization: Unknown Has patient had a PCN reaction occurring within the last 10 years: Unknown If all of the above answers are "NO", then may proceed with Cephalosporin use.     Silodosin Other (See Comments)    Vertigo     Mariel Craft, MD   Total Time Spent in Direct Patient Care:  I personally spent 40 minutes on the unit in direct patient care. The direct patient care time included face-to-face time with the patient, reviewing the patient's chart, communicating with other professionals, and coordinating care. Greater than 50% of this time was spent in counseling or coordinating care with the patient regarding goals of hospitalization, psycho-education, and discharge planning needs.

## 2023-08-06 NOTE — TOC Progression Note (Signed)
 Transition of Care [facility]) - Progression Note    Patient Details  Name: Kenneth [name] MRN: [id number] Date of Birth: 12-Nov-1950  [facility] CM/SW Contact  Mearl Latin, LCSW Phone Number: 08/06/2023, 3:15 PM  Clinical Narrative:    CSW continuing to follow for SNF.   Expected Discharge Plan: Skilled Nursing Facility Barriers to Discharge: Continued Medical Work up, SNF Pending bed offer  Expected Discharge Plan and Services In-house Referral: Clinical Social Work   Post Acute Care Choice: Skilled Nursing Facility Living arrangements for the past 2 months: Single Family Home                                       Social Determinants of Health (SDOH) Interventions SDOH Screenings   Food Insecurity: No Food Insecurity (08/01/2023)  Housing: Low Risk  (08/01/2023)  Transportation Needs: No Transportation Needs (08/01/2023)  Utilities: Not At Risk (08/01/2023)  Depression (PHQ2-9): Low Risk  (11/07/2022)  Social Connections: Socially Isolated (08/01/2023)  Tobacco Use: Medium Risk (08/01/2023)    Readmission Risk Interventions     No data to display

## 2023-08-06 NOTE — Progress Notes (Signed)
  Echocardiogram 2D Echocardiogram has been performed.  Janalyn Harder 08/06/2023, 12:18 PM

## 2023-08-06 NOTE — Consult Note (Signed)
 [facility] Psychiatric Consult Initial  Patient Name: .Kenneth [name]  MRN: [id number]  DOB: 1950-07-05  Consult Order details:  Orders (From admission, onward)     Start     Ordered   08/04/23 0855  IP CONSULT TO PSYCHIATRY       Ordering Provider: Leroy Sea, MD  Provider:  (Not yet assigned)  Question Answer Comment  Location [facility]   Reason for Consult? Flat affect, poor appetite, generalized deconditioning, per family worsening depression.      08/04/23 0855             Mode of Visit: In person    Psychiatry Consult Evaluation  Service Date: August 06, 2023 LOS:  LOS: 5 days  Chief Complaint "I've not been eating"  Primary Psychiatric Diagnoses  Altered mental status 2.  Depression  Assessment  BRAD LIEURANCE is a 73 y.o. male admitted: Medically on 08/01/2023  6:19 AM for  generalized weakness in a patient with gradually progressive anemia of chronic disease, dehydration, hypomagnesemia.  He has no documented past psychiatric history, but prescribed Lexapro as home medication presumably for depression.  He has a past medical history of  stroke, currently on DAPT, dyslipidemia, BPH, prostate cancer, insulin-dependent DM type II, hypertension, cervical stenosis, history of iron deficiency anemia, history of OSA does not wear CPAP, recent history of right ankle strain 2 weeks ago, presents to the hospital for 2-3 history of gradually progressive weakness, was also not eating or drinking well for a few days.   Met with resident covering neurology service who indicated patient has been diagnosed with a B12 deficiency that may be contributing to his current mental state.  His current presentation of altered mental status is most consistent with her underlying medical condition. He meets criteria for depression based on poor appetite, decreased interest in usual activities, sleeping more than usual, low energy, poor concentration.  Current  outpatient psychotropic medications include Lexapro and historically he has had a minimal response to these medications. He was compliant with medications prior to admission as evidenced by patient and wife report. On initial examination, patient is drowsy during assessment.  He will become alert to response, but is disoriented to time.  He provides permission to contact his wife, but is unable to provide her name or phone number.  Patient states that Lexapro has not seem to help his depressed mood.  He is open to a change in antidepressant and reviewed mirtazapine which can help improve sleep and appetite.  He is agreeable, but would like me to review this with his wife.  Discussed patient diagnoses of problem list of obstructive sleep apnea.  Patient states he has not ever used CPAP, but is open to trial of CPAP hospitalized. Patient specifically denies any SI, HI, AVH.  Please see plan below for detailed recommendations.   Diagnoses:  Active Hospital problems: Principal Problem:   Symptomatic anemia Active Problems:   Weakness   Thrombocytopenia (HCC)   History of stroke    Plan   ## Psychiatric Medication Recommendations:  -- Discontinue Lexapro due to ineffectiveness for depression management -- Mirtazapine 7.5 mg for depressed mood with side effects of sedation and increasing appetite. -- Nutritional supplementation as recommended by neurology and primary team   ## Medical Decision Making Capacity:  Not specifically addressed at this encounter.  Patient does not have full orientation at time of this interview.  Wife was at bedside at reassessment and is able to  assist with medical decision making.  ## Further Work-up:  -- EEG being placed at the end of my assessment.  Results pending --Other labs per primary team and neurology. -- most recent EKG on 08/01/2023 had QtC of 469 -- Pertinent labwork reviewed earlier this admission includes: Mild hyponatremia, elevated blood sugars,  normal magnesium and ammonia, normal BNP, CBC with anemia   ## Disposition:-- There are no psychiatric contraindications to discharge at this time  ## Behavioral / Environmental: -Delirium Precautions: Delirium Interventions for Nursing and Staff: - RN to open blinds every AM. - To Bedside: Glasses, hearing aide, and pt's own shoes. Make available to patients. when possible and encourage use. - Encourage po fluids when appropriate, keep fluids within reach. - OOB to chair with meals. - Passive ROM exercises to all extremities with AM & PM care. - RN to assess orientation to person, time and place QAM and PRN. - Recommend extended visitation hours with familiar family/friends as feasible. - Staff to minimize disturbances at night. Turn off television when pt asleep or when not in use.    ## Safety and Observation Level:  - Based on my clinical evaluation, I estimate the patient to be at low risk of self harm in the current setting. - At this time, we recommend  routine. This decision is based on my review of the chart including patient's history and current presentation, interview of the patient, mental status examination, and consideration of suicide risk including evaluating suicidal ideation, plan, intent, suicidal or self-harm behaviors, risk factors, and protective factors. This judgment is based on our ability to directly address suicide risk, implement suicide prevention strategies, and develop a safety plan while the patient is in the clinical setting. Please contact our team if there is a concern that risk level has changed.  CSSR Risk Category:C-SSRS RISK CATEGORY: No Risk  Suicide Risk Assessment: Patient has following modifiable risk factors for suicide: under treated depression , which we are addressing by medication management. Patient has following non-modifiable or demographic risk factors for suicide: male gender Patient has the following protective factors against suicide:  Supportive family, Supportive friends, Cultural, spiritual, or religious beliefs that discourage suicide, no history of suicide attempts, and no history of NSSIB  Thank you for this consult request. Recommendations have been communicated to the primary team.  We will continue to follow at this time.   Mariel Craft, MD       History of Present Illness  Relevant Aspects of  Hospital Course:  Admitted on 08/01/2023 for altered mental status.   Collateral information:  Collateral obtained from wife and caregiver at bedside at time of reassessment in the afternoon on 08/04/2023: Patient was more alert, and patient and wife described him as improved from yesterday and more talkative. Wife states that patient had been becoming depressed for some time prior to admission.  She states he ultimately ended up on Mounjaro and Toujeo for management of his diabetes.  Wife states that he did have significant weight loss, but she feels like it is "short circuited his brain".  She states that after starting [facility] patient became more forgetful and depressed.  Patient has since discontinued Mounjaro and his Toujeo dose has been decreased in half. She states that he had always been a picky eater, but after starting Mounjaro, he has lost his appetite and not regained it since discontinuation.   Patient Report:  Patient has fluctuating attention during assessments.  He specifically denies any SI, HI, AVH.  He does endorse depression.  Psych ROS:  Depression: Yes Anxiety: Yes Mania (lifetime and current): None Psychosis: (lifetime and current): None  Review of Systems  Constitutional:  Positive for malaise/fatigue and weight loss.  Respiratory: Negative.    Neurological:  Positive for weakness.  Psychiatric/Behavioral:  Positive for depression and memory loss. Negative for hallucinations, substance abuse and suicidal ideas. The patient is not nervous/anxious and does not have insomnia.      Psychiatric  and Social History  Psychiatric History:  Information collected from patient and wife  Prev Dx/Sx: Depression Current Psych Provider: None Home Meds (current): Lexapro Previous Med Trials: Lexapro Therapy: None  Prior Psych Hospitalization: None Prior Self Harm: None Prior Violence: None  Family Psych History: None Family Hx suicide: None  Social History:  Developmental Hx: Not obtained Educational Hx: Not obtained Occupational Hx: Retired Armed forces operational officer Hx: None Living Situation: Lives with wife and caregiver Spiritual Hx: Not asked Access to weapons/lethal means: Denies   Social History   Socioeconomic History   Marital status: Married    Spouse name: Not on file   Number of children: Not on file   Years of education: Not on file   Highest education level: Not on file  Occupational History   Not on file  Tobacco Use   Smoking status: Former    Current packs/day: 0.00    Average packs/day: 0.3 packs/day for 2.0 years (0.5 ttl pk-yrs)    Types: Cigarettes    Start date: 09/23/1967    Quit date: 09/22/1969    Years since quitting: 53.9   Smokeless tobacco: Never  Vaping Use   Vaping status: Never Used  Substance and Sexual Activity   Alcohol use: No   Drug use: No   Sexual activity: Yes  Other Topics Concern   Not on file  Social History Narrative   Right handed    Wear glasses   Social Drivers of Health   Financial Resource Strain: Not on file  Food Insecurity: No Food Insecurity (08/01/2023)   Hunger Vital Sign    Worried About Running Out of Food in the Last Year: Never true    Ran Out of Food in the Last Year: Never true  Transportation Needs: No Transportation Needs (08/01/2023)   PRAPARE - Administrator, Civil Service (Medical): No    Lack of Transportation (Non-Medical): No  Physical Activity: Not on file  Stress: Not on file  Social Connections: Socially Isolated (08/01/2023)   Social Connection and Isolation Panel [NHANES]    Frequency of  Communication with Friends and Family: Never    Frequency of Social Gatherings with Friends and Family: Never    Attends Religious Services: Never    Database administrator or Organizations: No    Attends Banker Meetings: Never    Marital Status: Married  Catering manager Violence: Not At Risk (08/01/2023)   Humiliation, Afraid, Rape, and Kick questionnaire    Fear of Current or Ex-Partner: No    Emotionally Abused: No    Physically Abused: No    Sexually Abused: No    Substance History Alcohol: None Type of alcohol N/A Last Drink N/A Number of drinks per day: N/A History of alcohol withdrawal seizures in N/A History of DT's in  Tobacco: Former Illicit drugs: N/A Prescription drug abuse: N/A Rehab hx: N/A  Exam Findings  Physical Exam:  Vital Signs:  Temp:  [97.4 F (36.3 C)-98.1 F (36.7 C)] 98.1 F (36.7 C) (03/27  8119) Pulse Rate:  [68-81] 68 (03/27 0723) Resp:  [18-22] 21 (03/27 0723) BP: (106-134)/(52-70) 110/67 (03/27 0723) SpO2:  [92 %-97 %] 95 % (03/27 0723) Blood pressure 110/67, pulse 68, temperature 98.1 F (36.7 C), temperature source Oral, resp. rate (!) 21, height 5\' 11"  (1.803 m), weight 86.6 kg, SpO2 95%. Body mass index is 26.64 kg/m.  Physical Exam  Mental Status Exam: General Appearance: Disheveled  Orientation:  Other:  Person and place  Memory:  Immediate;   Poor Recent;   Poor Remote;   Poor  Concentration:  Concentration: Poor and Attention Span: Poor  Recall:  Poor  Attention  Poor  Eye Contact:  Minimal  Speech:  Garbled  Language:  Fair  Volume:  Decreased  Mood: Depressed  Affect:  Depressed and Flat  Thought Process:  Coherent  Thought Content:  Hallucinations: None  Suicidal Thoughts:  No  Homicidal Thoughts:  No  Judgement:  Impaired  Insight:  Shallow  Psychomotor Activity:  Decreased  Akathisia:  No  Fund of Knowledge:  Fair      Assets:  Desire for Improvement Resilience Social Support  Cognition:   Impaired,  Moderate  ADL's:  Impaired  AIMS (if indicated):        Other History   These have been pulled in through the EMR, reviewed, and updated if appropriate.  Family History:  The patient's family history includes Diabetes in his mother.  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   History of colon polyps - adenomas 04/03/2010   Hyperlipidemia    Hypertension    Iron deficiency anemia, unspecified    OSA (obstructive sleep apnea)    refusing CPAP   Pneumonia 03/2022   w/effusion   Prostate cancer (HCC)    Stroke [facility])     Surgical History: Past Surgical History:  Procedure Laterality Date   ANTERIOR CERVICAL DECOMP/DISCECTOMY FUSION N/A 10/01/2012   Procedure: ANTERIOR CERVICAL DECOMPRESSION/DISCECTOMY FUSION 2 LEVELS;  Surgeon: Tia Alert, MD;  Location: [facility] NEURO ORS;  Service: Neurosurgery;  Laterality: N/A;  Cervical five-six,Cervical six-seven   COLONOSCOPY  09/30/2010, 03/01/2014   diverticulosis, small internal hemorrhoids   COLONOSCOPY W/ POLYPECTOMY  04/03/2010   4 polyps 8-14mm, worst TV adenoma with high-grade dysplasia   ESOPHAGOGASTRODUODENOSCOPY N/A 08/02/2023   Procedure: EGD (ESOPHAGOGASTRODUODENOSCOPY);  Surgeon: [name], MD;  Location[facility] ENDOSCOPY;  Service: Gastroenterology;  Laterality: N/A;   LOOP RECORDER INSERTION N/A 10/10/2022   Procedure: LOOP RECORDER INSERTION;  Surgeon: Marinus Maw, MD;  Location: [facility] INVASIVE CV LAB;  Service: Cardiovascular;  Laterality: N/A;   NO PAST SURGERIES     POLYPECTOMY     POSTERIOR CERVICAL FUSION/FORAMINOTOMY Left 10/20/2012   Procedure: C/5-6,C/6-7 Lami/Multi level,POSTERIOR CERVICAL FUSION/FORAMINOTOMY C/4-7;  Surgeon: Tia Alert, MD;  Location: [facility] NEURO ORS;  Service: Neurosurgery;   Laterality: Left;  Cervical Five-Six, Cervical Six-Seven Laminectomies, Posterior Cervical Fusion/Foraminotomies Cervical Four through Seven.    TONSILLECTOMY       Medications:   Current Facility-Administered Medications:    acetaminophen (TYLENOL) tablet 650 mg, 650 mg, Oral, Q6H PRN, 650 mg at 08/05/23 0950 **OR** acetaminophen (TYLENOL) suppository 650 mg, 650 mg, Rectal, Q6H PRN, Leroy Sea, MD   albuterol (PROVENTIL) (2.5  MG/3ML) 0.083% nebulizer solution 2.5 mg, 2.5 mg, Nebulization, Q6H PRN, Leroy Sea, MD   aspirin chewable tablet 81 mg, 81 mg, Oral, Daily, Leroy Sea, MD, 81 mg at 08/06/23 0817   atorvastatin (LIPITOR) tablet 40 mg, 40 mg, Oral, QHS, Leroy Sea, MD, 40 mg at 08/06/23 0020   bisacodyl (DULCOLAX) EC tablet 5 mg, 5 mg, Oral, Daily PRN, Leroy Sea, MD   cyanocobalamin (VITAMIN B12) tablet 1,000 mcg, 1,000 mcg, Oral, Daily, Leroy Sea, MD, 1,000 mcg at 08/06/23 1610   ezetimibe (ZETIA) tablet 10 mg, 10 mg, Oral, Daily, Leroy Sea, MD, 10 mg at 08/06/23 9604   ferrous sulfate tablet 325 mg, 325 mg, Oral, BID WC, Leroy Sea, MD, 325 mg at 08/06/23 5409   folic acid (FOLVITE) tablet 1 mg, 1 mg, Oral, Daily, Leroy Sea, MD, 1 mg at 08/06/23 8119   Gerhardt's butt cream, , Topical, BID, Leroy Sea, MD, Given at 08/06/23 0818   insulin aspart (novoLOG) injection 0-15 Units, 0-15 Units, Subcutaneous, TID WC, Leroy Sea, MD, 2 Units at 08/06/23 0818   insulin aspart (novoLOG) injection 0-5 Units, 0-5 Units, Subcutaneous, QHS, Singh, Stanford Scotland, MD   insulin glargine-yfgn (SEMGLEE) injection 30 Units, 30 Units, Subcutaneous, Q1400, Leroy Sea, MD, 30 Units at 08/05/23 1240   lactated ringers infusion, , Intravenous, Continuous, Leroy Sea, MD, Last Rate: 75 mL/hr at 08/06/23 0023, New Bag at 08/06/23 0023   megestrol (MEGACE) tablet 40 mg, 40 mg, Oral, Daily, Leroy Sea, MD, 40 mg at  08/06/23 1478   menthol-cetylpyridinium (CEPACOL) lozenge 3 mg, 1 lozenge, Oral, PRN, Howerter, Justin B, DO   mirtazapine (REMERON) tablet 7.5 mg, 7.5 mg, Oral, QHS, Singh, Stanford Scotland, MD   [DISCONTINUED] ondansetron (ZOFRAN) tablet 4 mg, 4 mg, Oral, Q6H PRN **OR** ondansetron (ZOFRAN) injection 4 mg, 4 mg, Intravenous, Q6H PRN, Leroy Sea, MD   pantoprazole (PROTONIX) EC tablet 40 mg, 40 mg, Oral, BID AC, Leroy Sea, MD, 40 mg at 08/06/23 0817   phenol (CHLORASEPTIC) mouth spray 1 spray, 1 spray, Mouth/Throat, PRN, Howerter, Justin B, DO, 1 spray at 08/04/23 2123   tamsulosin (FLOMAX) capsule 0.4 mg, 0.4 mg, Oral, QPM, Susa Raring K, MD, 0.4 mg at 08/05/23 1717   thiamine (VITAMIN B1) tablet 100 mg, 100 mg, Oral, q AM, Tomie China, MD, 100 mg at 08/06/23 2956   ticagrelor (BRILINTA) tablet 90 mg, 90 mg, Oral, BID, Leroy Sea, MD, 90 mg at 08/06/23 2130  Allergies: Allergies  Allergen Reactions   Jardiance [Empagliflozin] Other (See Comments)    To the hospital- due to complications   Fluoxetine Other (See Comments)    Prefers to NOT take this   Penicillins Other (See Comments)    CHILDHOOD ALLERGY- exact reaction? Has patient had a PCN reaction causing immediate rash, facial/tongue/throat swelling, SOB or lightheadedness with hypotension: Unknown Has patient had a PCN reaction causing severe rash involving mucus membranes or skin necrosis: Unknown Has patient had a PCN reaction that required hospitalization: Unknown Has patient had a PCN reaction occurring within the last 10 years: Unknown If all of the above answers are "NO", then may proceed with Cephalosporin use.     Silodosin Other (See Comments)    Vertigo     Mariel Craft, MD  Total Time Spent in Direct Patient Care:  I personally spent 35 minutes on the unit in direct patient care. The direct patient  care time included face-to-face time with the patient, reviewing the patient's chart,  communicating with other professionals, and coordinating care. Greater than 50% of this time was spent in counseling or coordinating care with the patient regarding goals of hospitalization, psycho-education, and discharge planning needs.

## 2023-08-06 NOTE — Progress Notes (Addendum)
 PROGRESS NOTE                                                                                                                                                                                                             Patient Demographics:    Kenneth [name], is a 73 y.o. male, DOB - 1950-10-28, ZOX:[id number]  Outpatient Primary MD for the patient is [name], MD    LOS - 5  Admit date - 08/01/2023    Chief Complaint  Patient presents with   Weakness       Brief Narrative (HPI from H&P)   73 y.o. male, with history of stroke, currently on DAPT, dyslipidemia, BPH, prostate cancer, insulin-dependent DM type II, hypertension, cervical stenosis, history of iron deficiency anemia, history of OSA does not wear CPAP, recent history of right ankle strain 2 weeks ago, presents to the hospital for 2-3 history of gradually progressive weakness, was also not eating or drinking well for a few days.  For the last couple of days he has been having problems getting out of the bed and ambulating as he is extremely weak, in the ER he was found to be hypotensive, had hypomagnesemia, was also found to have new onset anemia and I was called to admit the patient.    Subjective:   Patient in bed, appears comfortable, denies any headache, no fever, no chest pain or pressure, no shortness of breath , no abdominal pain. No new focal weakness.   Assessment  & Plan :    1.  Gradually progressive generalized weakness in a patient with gradually progressive anemia of chronic disease, dehydration, hypomagnesemia.   Patient has had chronic anemia for several years however there is at least a 3 g drop in his hemoglobin in the last few months, of note he has been started on DAPT for his recent stroke around that timeframe, he denies any hematemesis, no dark stools or blood in stool.  He likely has intermittent chronic GI blood loss, Hemoccult negative in  the ER, place him on PPI, for now continue DAPT, GI has been consulted, EGD done on 08/02/2023 did not show any acute bleed, stable peripheral smear, anemia panel noted with possible mild iron deficiency and B12 deficiency both have been replaced, CBC is currently stable, on PPI, continue to monitor.  Mentation off and  on close to baseline, mild hospital-acquired delirium as well now.     2.   Recent stroke few months ago, 2 episodes of cryptogenic stroke in the last several months, now appears to have a third episode of subacute to acute cryptogenic strokes this admission.  On DAPT, statin and Zetia, MRI noted, had 2 different episodes of cryptogenic bilateral strokes 2 months and 8 months ago respectively, MRI now again shows new bilateral cryptogenic strokes, he has had loop order placement and evaluation which was unremarkable few months ago, neuro team on board, case discussed with stroke team, on 08/05/2023 and again on 08/06/2023.  He came in on aspirin, Brilinta along with statin and Zetia.  Patient now has evidence of C-spine compression on MRI, will require C-spine surgery, continue aspirin, statin and Zetia.  Brilinta discontinued 08/06/2023 in anticipation of C-spine surgery, per stroke team after surgery once cleared by neurosurgeon placed him on aspirin along with Plavix with outpatient follow-up with neurology.    3.  MRI evidence of C-spine compression.  Currently no focal deficits, seen by Dr. Franky Macho, likely will require surgical decompression likely coming Monday, Brilinta last dose 08/06/2023 morning, it has been discontinued, currently on baby aspirin, he is very frail at baseline he is at the very least moderate risk for adverse cardiopulmonary outcome, will have cardiology also evaluate him prior to surgery.  No c-collar needed.  Once cleared by cardiology and Brilinta sufficiently out of system will proceed with surgical decompression of his C-spine.    4.   Metabolic encephalopathy,  generalized weakness.  Underlying depression.  No new focal deficits, currently see above, had extensive workup and seen by neurology, stable TSH, stable ABG, stable ammonia, stable EEG, MRI showing multiple acute and subacute strokes, kindly see below.  Continue supportive care, no focal deficits but continued several months decline in functional status, wondering if multiple strokes also taking a toll.  MRI brain and C-spine noted, kindly see stroke discussion below, C-spine findings discussed with Dr. Amada Jupiter, he requests a neurosurgery consult which will be obtained.  At this time no focal deficits.   5.  BPH.  Continue Flomax.  UA stable no UTI.   6.  Depression.  Psych consulted, Lexapro was stopped by psych, defer any additional medications or changes in psych medications to the psych team.   7.  Dyslipidemia.  Continue statin and Zetia.   8.  Essential hypertension.  Currently blood pressure low, dehydrated, hold blood pressure medications hydrate and monitor.     9.  History of cervical stenosis, recent right ankle strain.  Supportive care.     10.  Borderline iron and B12 deficiency.  Replaced.    11.  Hyponatremia.  has been hydrated adequately, stable.  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  Fluid restriction and monitor.  12. Hypomagnesemia and dehydration.  Hydrated with IV fluids, magnesium replaced stable.  13. Oral intake and appetite.  Add Megace.  Treat depression.    14. OSA.  Does not use CPAP.    15.  Asymptomatic transaminitis.  Likely due to hypotension, check right upper quadrant ultrasound, acute hepatitis panel and INR.    16. DM type II.  On high doses of insulin at home, continue with q. [facility] S sliding scale.  Every 2 Accu-Cheks while n.p.o. on 08/02/2023.   CBG (last 3)  Recent Labs    08/05/23 1606 08/05/23 2116 08/06/23 0721  GLUCAP 202* 158* 138*    Lab Results  Component Value Date  HGBA1C 5.3 08/01/2023         Condition -  Guarded  Family Communication  : Wife on the day of admission 08/01/2023 bedside, wife over the phone 08/02/2023, bedside 08/03/2023, 08/04/2023 x 3, 08/05/2023, called wife multiple times 08/06/2023 between 9 and 10 AM, total 5 different times phone switched off.  Code Status : Full code  Consults  : GI, Neuro, Cards, N. surgery  PUD Prophylaxis : PPI   Procedures  :     EEG.  Nonacute.    MRI brain.  Many small acute infarcts in the bilateral frontal and parietal lobes, left thalamus, right occipital lobe, and cerebellum. Given involvement of multiple vascular territories, consider an embolic etiology  MRI C-spine.  1. 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 Cord T2 hyperintensity superior to this level, suspicious for edema and compressive myelopathy. Severe left and moderate right foraminal stenosis at this level. 2. Edema involving the left T1 inferior articular facet, which is nonspecific but most likely related to stress. A CT could better assess bone detail and exclude fracture if clinically warranted.  CT head.  1. No evidence of an acute intracranial abnormality. 2. 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 3. 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 4. Moderate generalized cerebral atrophy. 5. Paranasal sinus disease as described.   EGD  Impression:               - The examined portions of the nasopharynx,                            oropharynx and larynx were normal.                           - Normal esophagus.                           - 3 cm hiatal hernia with prolapsing cardia.                           - Gastritis. Biopsied.                           - Normal examined duodenum.                           - No obvious source of GI bleeding. Doubt mild                            gastritis would have been a source of significant                             bleed. Moderate Sedation:      N/A Recommendation:           - Patient has a contact number available for                            emergencies. The signs and symptoms of potential  delayed complications were discussed with the                            patient. Return to normal activities tomorrow.                            Written discharge instructions were provided to the                            patient.                           - Resume previous diet.                           - Continue present medications including                            Brillinta/ASA.                           - Await pathology results.                           - Consider non-GI sources of anemia, given absence                            of overt bleeding, negative FOBT and concomitant                            thrombocytopenia.                           - Colonoscopy could be considered for further                            evaluation, but likely low yield, given his recent                            colonoscopy in 2021.                           - GI will sign off for now. If no other possible                            etiologies for anemia identified, or if patient has                            overt GI bleeding, please reconsult GI.      Disposition Plan  :    Status is: Inpatient   DVT Prophylaxis  :    SCDs Start: 08/01/23 1239 Place TED hose Start: 08/01/23 1233    Lab Results  Component Value Date   PLT 97 (L) 08/06/2023    Diet :  Diet Order             Diet regular Room service appropriate? Yes with Assist; Fluid consistency: Thin  Diet effective now                    Inpatient Medications  Scheduled Meds:  aspirin  81 mg Oral Daily   atorvastatin  40 mg Oral QHS   vitamin B-12  1,000 mcg Oral Daily   ezetimibe  10 mg Oral Daily   ferrous sulfate  325 mg Oral BID WC   folic acid  1 mg Oral Daily   Gerhardt's butt  cream   Topical BID   insulin aspart  0-15 Units Subcutaneous TID WC   insulin aspart  0-5 Units Subcutaneous QHS   insulin glargine-yfgn  30 Units Subcutaneous Q1400   megestrol  40 mg Oral Daily   mirtazapine  7.5 mg Oral QHS   pantoprazole  40 mg Oral BID AC   tamsulosin  0.4 mg Oral QPM   thiamine  100 mg Oral q AM   ticagrelor  90 mg Oral BID   Continuous Infusions:  lactated ringers 75 mL/hr at 08/06/23 0023    PRN Meds:.acetaminophen **OR** acetaminophen, albuterol, bisacodyl, menthol-cetylpyridinium, [DISCONTINUED] ondansetron **OR** ondansetron (ZOFRAN) IV, phenol  Antibiotics  :    Anti-infectives (From admission, onward)    None         Objective:   Vitals:   08/05/23 1924 08/05/23 2309 08/06/23 0316 08/06/23 0723  BP: 134/70 (!) 120/55 (!) 116/56 110/67  Pulse: 81 74 75 68  Resp: 18 (!) 21 20 (!) 21  Temp: 97.7 F (36.5 C) 97.8 F (36.6 C) 97.6 F (36.4 C) 98.1 F (36.7 C)  TempSrc: Oral Oral Oral Oral  SpO2: 95% 92% 92% 95%  Weight:      Height:        Wt Readings from Last 3 Encounters:  08/01/23 86.6 kg  05/21/23 88.7 kg  05/01/23 89.3 kg     Intake/Output Summary (Last 24 hours) at 08/06/2023 1017 Last data filed at 08/06/2023 0530 Gross per 24 hour  Intake 637.82 ml  Output 1600 ml  Net -962.18 ml     Physical Exam  Awake but pleasantly confused, No new F.N deficits, overall quite deconditioned [place].AT,PERRAL Supple Neck, No JVD,   Symmetrical Chest wall movement, Good air movement bilaterally, CTAB RRR,No Gallops,Rubs or new Murmurs,  +ve B.Sounds, Abd Soft, No tenderness,   No Cyanosis, Clubbing or edema        Data Review:    Recent Labs  Lab 08/03/23 0513 08/03/23 1834 08/04/23 0452 08/05/23 0457 08/06/23 0018 08/06/23 0509  WBC 7.5 8.9 8.5 9.0 9.8 9.2  HGB 8.8* 9.6* 8.7* 9.0* 8.7* 8.9*  HCT 24.9* 27.3* 24.7* 26.3* 24.9* 25.6*  PLT 108* 101* 101* 97* 95* 97*  MCV 85.6 86.7 84.6 86.5 85.0 85.3  MCH 30.2 30.5 29.8  29.6 29.7 29.7  MCHC 35.3 35.2 35.2 34.2 34.9 34.8  RDW 14.8 15.2 14.9 15.3 15.2 15.2  LYMPHSABS 1.0 1.0 0.5* 0.9  --  1.1  MONOABS 2.0* 1.9* 0.7 2.4*  --  2.2*  EOSABS 0.0 0.0 0.0 0.0  --  0.0  BASOSABS 0.0 0.0 0.1 0.0  --  0.1    Recent Labs  Lab 08/01/23 0630 08/01/23 0913 08/01/23 1310 08/02/23 0549 08/03/23 0513 08/04/23 0452 08/04/23 0845 08/05/23 0457 08/05/23 0500 08/06/23 0018 08/06/23 0509  NA 132*  --   --  131* 131* 130*  --  132*  --  132* 134*  K 4.1  --   --  3.7 3.7 3.7  --  3.5  --  3.6 3.6  CL 101  --   --  100 102 101  --  101  --  102 102  CO2 21*  --   --  22 24 24   --  23  --  23 25  ANIONGAP 10  --   --  9 5 5   --  8  --  7 7  GLUCOSE 168*  --   --  143* 146* 159*  --  136*  --  157* 140*  BUN 17  --   --  12 17 20   --  29*  --  31* 32*  CREATININE 0.87  --   --  0.81 0.87 0.89  --  0.88  --  0.94 0.96  AST 26  --   --  19 25 47*  --  123*  --   --  206*  ALT 15  --   --  17 22 40  --  123*  --   --  186*  ALKPHOS 43  --   --  43 45 60  --  91  --   --  142*  BILITOT 1.9*  --   --  1.4* 1.3* 1.3*  --  1.5*  --   --  1.5*  ALBUMIN 2.6*  --   --  2.5* 2.4* 2.2*  --  2.2*  --   --  2.1*  CRP  --   --   --   --   --   --   --   --   --   --  12.6*  PROCALCITON  --   --   --   --   --   --   --   --   --   --  0.65  TSH  --   --  1.499  --   --   --   --   --   --   --   --   HGBA1C 5.3  --   --   --   --   --   --   --   --   --   --   AMMONIA  --   --   --   --   --   --  17 16  --   --   --   BNP  --    < >  --  72.3 53.6 27.8  --   --  41.4  --  31.9  MG 1.4*  --   --  1.7 1.6* 1.9  --  1.8  --  1.8 1.8  PHOS  --   --   --  3.6 3.6 3.8  --  3.9  --   --   --   CALCIUM 8.1*  --   --  8.3* 7.8* 7.7*  --  8.0*  --  7.7* 8.1*   < > = values in this interval not displayed.      Recent Labs  Lab 08/01/23 0630 08/01/23 0913 08/01/23 1310 08/02/23 0549 08/03/23 0513 08/04/23 0452 08/04/23 0845 08/05/23 0457 08/05/23 0500 08/06/23 0018  08/06/23 0509  CRP  --   --   --   --   --   --   --   --   --   --  12.6*  PROCALCITON  --   --   --   --   --   --   --   --   --   --  0.65  TSH  --   --  1.499  --   --   --   --   --   --   --   --   HGBA1C 5.3  --   --   --   --   --   --   --   --   --   --   AMMONIA  --   --   --   --   --   --  17 16  --   --   --   BNP  --    < >  --  72.3 53.6 27.8  --   --  41.4  --  31.9  MG 1.4*  --   --  1.7 1.6* 1.9  --  1.8  --  1.8 1.8  CALCIUM 8.1*  --   --  8.3* 7.8* 7.7*  --  8.0*  --  7.7* 8.1*   < > = values in this interval not displayed.    --------------------------------------------------------------------------------------------------------------- Lab Results  Component Value Date   CHOL 79 04/07/2023   HDL 17 (L) 04/07/2023   LDLCALC 47 04/07/2023   TRIG 75 04/07/2023   CHOLHDL 4.6 04/07/2023    Lab Results  Component Value Date   HGBA1C 5.3 08/01/2023   No results for input(s): "TSH", "T4TOTAL", "FREET4", "T3FREE", "THYROIDAB" in the last 72 hours.  No results for input(s): "VITAMINB12", "FOLATE", "FERRITIN", "TIBC", "IRON", "RETICCTPCT" in the last 72 hours.  ------------------------------------------------------------------------------------------------------------------ Cardiac Enzymes No results for input(s): "CKMB", "TROPONINI", "MYOGLOBIN" in the last 168 hours.  Invalid input(s): "CK"  Micro Results Recent Results (from the past 240 hours)  Resp panel by RT-PCR (RSV, Flu A&B, Covid) Anterior Nasal Swab     Status: None   Collection Time: 08/01/23  7:13 AM   Specimen: Anterior Nasal Swab  Result Value Ref Range Status   SARS Coronavirus 2 by RT PCR NEGATIVE NEGATIVE Final   Influenza A by PCR NEGATIVE NEGATIVE Final   Influenza B by PCR NEGATIVE NEGATIVE Final    Comment: (NOTE) The Xpert Xpress SARS-CoV-2/FLU/RSV plus assay is intended as an aid in the diagnosis of influenza from Nasopharyngeal swab specimens and should not be used as a sole basis  for treatment. Nasal washings and aspirates are unacceptable for Xpert Xpress SARS-CoV-2/FLU/RSV testing.  Fact Sheet for Patients: BloggerCourse.com  Fact Sheet for Healthcare Providers: SeriousBroker.it  This test is not yet approved or cleared by the [place] FDA and has been authorized for detection and/or diagnosis of SARS-CoV-2 by FDA under an Emergency Use Authorization (EUA). This EUA will remain in effect (meaning this test can be used) for the duration of the COVID-19 declaration under Section 564(b)(1) of the Act, 21 U.S.C. section 360bbb-3(b)(1), unless the authorization is terminated or revoked.     Resp Syncytial Virus by PCR NEGATIVE NEGATIVE Final    Comment: (NOTE) Fact Sheet for Patients: BloggerCourse.com  Fact Sheet for Healthcare Providers: SeriousBroker.it  This test is not yet approved or cleared by the [place] FDA and has been authorized for detection and/or diagnosis of SARS-CoV-2 by FDA under an Emergency Use Authorization (EUA). This EUA will remain in effect (meaning this test can be used) for the duration of the COVID-19 declaration under Section 564(b)(1) of the Act, 21 U.S.C. section 360bbb-3(b)(1), unless the authorization is terminated or revoked.  Performed at [facility], [address], [place], [place] [address]  Radiology Report DG Chest Port 1 View Result Date: 08/05/2023 CLINICAL DATA:  Shortness of breath. EXAM: PORTABLE CHEST 1 VIEW COMPARISON:  August 04, 2023. FINDINGS: Stable cardiomediastinal silhouette. Left lung is clear. Stable right basilar opacities are noted concerning for subsegmental atelectasis or possibly pneumonia. Bony thorax is unremarkable. IMPRESSION: Stable right basilar opacities as noted above. Electronically Signed   By: Lupita Raider M.D.   On: 08/05/2023 11:23   MR CERVICAL SPINE W WO  CONTRAST Result Date: 08/04/2023 CLINICAL DATA:  hyperreflexia and worsening neurological status in setting of prior C-spine surgery EXAM: MRI CERVICAL SPINE WITHOUT AND WITH CONTRAST TECHNIQUE: Multiplanar and multiecho pulse sequences of the cervical spine, to include the craniocervical junction and cervicothoracic junction, were obtained without and with intravenous contrast. CONTRAST:  8.17mL GADAVIST GADOBUTROL 1 MMOL/ML IV SOLN COMPARISON:  None Available. FINDINGS: Moderately motion limited study. Alignment: No substantial sagittal subluxation. Vertebrae: Motion limited evaluation. Edema involving the left T1 inferior articular facet. C5-C7 ACDF. Cord: Cord T2/hyperintensity from C2 to C3-C4. Posterior Fossa, vertebral arteries, paraspinal tissues: Visualized vertebral artery flow voids are maintained. No significant paraspinal edema. Disc levels: C2-C3: No significant disc protrusion, foraminal stenosis, or canal stenosis. C3-C4: Posterior disc osteophyte complex with ligamentum flavum thickening and bilateral facet and uncovertebral hypertrophy. Resulting severe canal and left foraminal stenosis. Moderate right foraminal stenosis. C4-C5: Small posterior disc osteophyte complex and left greater than right facet and uncovertebral hypertrophy. Mild left foraminal stenosis. Patent canal. C5-C6: ACDF.  Patent canal and foramina. C6-C7: ACDF.  Patent canal and foramina. C7-T1: Small posterior disc osteophyte complex and bilateral facet and uncovertebral hypertrophy. Patent canal and foramina. IMPRESSION: 1. 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 Cord T2 hyperintensity superior to this level, suspicious for edema and compressive myelopathy. Severe left and moderate right foraminal stenosis at this level. 2. Edema involving the left T1 inferior articular facet, which is nonspecific but most likely related to stress. A CT could better assess bone detail and exclude fracture if clinically warranted. Electronically Signed    By: Feliberto Harts M.D.   On: 08/04/2023 20:53   MR THORACIC SPINE WO CONTRAST Result Date: 08/04/2023 CLINICAL DATA:  Mid-back pain, neuro deficit EXAM: MRI THORACIC SPINE WITHOUT CONTRAST TECHNIQUE: Multiplanar, multisequence MR imaging of the thoracic spine was performed. No intravenous contrast was administered. COMPARISON:  CT of the chest February 27, 2017. FINDINGS: Alignment:  No substantial sagittal subluxation. Vertebrae: Sclerotic lesion at T9 was present in 2018 and therefore likely is benign. No suspicious bone lesions. No bone marrow edema to suggest acute fracture. Vertebral body heights are maintained. Cord:  Normal cord signal. Paraspinal and other soft tissues: Motion limited evaluation without definite abnormality. Disc levels: No significant canal or foraminal stenosis. IMPRESSION: No evidence of acute abnormality or significant stenosis. Electronically Signed   By: Feliberto Harts M.D.   On: 08/04/2023 19:54   MR BRAIN WO CONTRAST Result Date: 08/04/2023 CLINICAL DATA:  Stroke, follow up EXAM: MRI HEAD WITHOUT CONTRAST TECHNIQUE: Multiplanar, multiecho pulse sequences of the brain and surrounding structures were obtained without intravenous contrast. COMPARISON:  CT head earlier today. 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 Associated Dema without substantial mass effect. No midline shift. No evidence of acute hemorrhage, mass lesion, or hydrocephalus. Vascular: Major arterial flow voids are maintained at the skull base. Skull and upper cervical spine: Normal marrow signal. Sinuses/Orbits: Negative. IMPRESSION: Many small acute infarcts in the  bilateral frontal and parietal lobes, left thalamus, right occipital lobe, and cerebellum. Given involvement of multiple vascular territories, consider an embolic etiology. Electronically Signed   By: Feliberto Harts M.D.   On: 08/04/2023 19:25    EEG adult Result Date: 08/04/2023 Charlsie Quest, MD     08/04/2023 12:10 PM Patient Name: LATTIE CERVI MRN: [id number] Epilepsy Attending: Charlsie Quest Referring Physician/Provider: Leroy Sea, MD Date: 08/04/2023 Duration: 25.23 mins Patient history: 73 year old male with altered mental status.  EEG to alert for seizure. Level of alertness: Awake AEDs during EEG study: None Technical aspects: This EEG study was done with scalp electrodes positioned according to the 10-20 International system of electrode placement. Electrical activity was reviewed with band pass filter of 1-70Hz , sensitivity of 7 uV/mm, display speed of 65mm/sec with a 60Hz  notched filter applied as appropriate. EEG data were recorded continuously and digitally stored.  Video monitoring was available and reviewed as appropriate. Description: The posterior dominant rhythm consists of 7 Hz activity of moderate voltage (25-35 uV) seen predominantly in posterior head regions, symmetric and reactive to eye opening and eye closing. EEG showed continuous generalized 6 to 7 Hz theta slowing. Hyperventilation and photic stimulation were not performed.   ABNORMALITY - Continuous slow, generalized IMPRESSION: This study is suggestive of mild diffuse encephalopathy. No seizures or epileptiform discharges were seen throughout the recording. Charlsie Quest      Signature  -   Susa Raring M.D on 08/06/2023 at 10:17 AM   -  To page go to [email or web address]

## 2023-08-06 NOTE — Consult Note (Signed)
 WOC Nurse Consult Note: Reason for Consult: Stage 2 sacrum  Wound type: pressure  Pressure Injury POA: no, admitted with moisture associated skin damage  Measurement: see nursing flowsheet  Wound bed: pink moist  Drainage (amount, consistency, odor)  see nursing flowsheet  Periwound: erythema with scattered partial thickness skin loss r/t moisture and friction  Dressing procedure/placement/frequency: Cleanse buttocks and sacrum with Vashe wound cleanser [place] [id number]), do not rinse and allow to air dry. Apply Gerhardt's Butt Cream to entire area 2 times a day and prn soiling. Cover with silicone foam or ABD pad whichever is preferred.   POC discussed with bedside nurse. WOC team will not follow. Re-consult if further needs arise.   Thank you,    Priscella Mann MSN, RN-BC, [employer] [phone]

## 2023-08-06 NOTE — Plan of Care (Signed)

## 2023-08-06 NOTE — Consult Note (Addendum)
 He presents  Cardiology Consultation   Patient ID: Kenneth [name] MRN: [id number]; DOB: 1950/09/02  Admit date: 08/01/2023 Date of Consult: 08/06/2023  PCP:  [name], MD   [facility] Providers Cardiologist:  Donato Schultz, MD   {  Patient Profile:   Kenneth [name] is a 73 y.o. male with a hx of multiple strokes currently on DAPT, dyslipidemia, BPH, prostate cancer, insulin-dependent type 2 diabetes, cervical stenosis, OSA not using CPAP who came to the hospital for recent progressive weakness found to have subacute stroke and c-spine compression requiring decompression surgery is being seen 08/06/2023 for the evaluation of preoperative cardiac evaluation at the request of Dr. Thedore Mins.  History of Present Illness:   Kenneth [name] has past medical history as stated above. He presented to the ED on 08/01/2023 complaining of rapidly progressing weakness over the past week. His wife states that 10 days prior to admission they were out shopping when he had a mechanical fall and sprained his ankle and has been using a walker ever since. Recently, he began having problems getting out of the bed and ambulating with profound weakness.   He has an extensive history of previous strokes and was found to have a third episode of a subacute to acute cryptogenic stroke this admission, seen on MRI. The MRI also revealed a C-spine compression that after being seen by neurosurgery was determined that it would likely require decompressive surgery, likely on Monday 3/31 due to his last dose of Brilinta being AM 3/27. Cardiology was asked to consult as they believe with his current state, he is at risk for an adverse cardiopulmonary outcome.  He is a patient of Dr. Anne Fu, who saw him last in clinic on 03/05/2023 for follow up after being admitted for a stroke that was thought to be cardioembolic in nature. He had a loop recorded implanted by Dr. Ladona Ridgel during on 10/10/2022 for further monitoring after  his stroke. At this follow up appointment patient had reported good recovery post-stroke but was concerned that he would have another event. He was primarily being seen in outpatient setting for follow up on his recent strokes, hyperlipidemia and hypertension.   His ILR was last interrogated on 07/12/2023 and showed: normal device function, no new symptoms, no tachy, brady or pause episodes, no A. Fib. [employer] was contacted to interrogate device in regards to this admission.   Physical therapy last saw the patient today 3/27 and noted: he was lethargic during the session and required multiple cues to open eyes and adhere to tasks. He was a total x 2 assist for bed mobility and was unable to assist at all physically. They noted patient had a significant right lateral lean when seated on edge of the bed and required max assist to maintain balance. They attempted stands x 3 with total assist x 2 using bed pad, gait belt and stedy and were unable to achieve upright posture or maintain balance, even with total assist x 2.  Psychiatry has now been brought on for consultation. They note that there is no documented past psychiatric history however patient was previously noted to be on Lexapro, presumably for depression. They believe his B12 deficiency may be contributing to his current mental state. They also note him to be drowsy during their examination, alert to response but disoriented to time. They decided to switch from Lexapro to Mirtazapine.  After speaking with patient and his wife, the patient is fairly drowsy during the exam but is able  to briefly open his eyes and answer my questions. He denies any chest pain, shortness of breath or other anginal symptoms. His wife expresses many concerns in regards to her husband. She states that he has been malnourished for some time because when he started on [facility] he had a severely decreased appetite and was not eating anything. She also believes that he is  dealing with severe depression which has affected his activity level lately. His wife states that prior to this most recent event he typically recovered well after prior strokes. She notes that most often he was being told to slow down in PT because he was walking too fast. She believes that he just does not have the motivation any more.   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   History of colon polyps - adenomas 04/03/2010   Hyperlipidemia    Hypertension    Iron deficiency anemia, unspecified    OSA (obstructive sleep apnea)    refusing CPAP   Pneumonia 03/2022   w/effusion   Prostate cancer (HCC)    Stroke [facility])    Past Surgical History:  Procedure Laterality Date   ANTERIOR CERVICAL DECOMP/DISCECTOMY FUSION N/A 10/01/2012   Procedure: ANTERIOR CERVICAL DECOMPRESSION/DISCECTOMY FUSION 2 LEVELS;  Surgeon: Tia Alert, MD;  Location: [facility] NEURO ORS;  Service: Neurosurgery;  Laterality: N/A;  Cervical five-six,Cervical six-seven   COLONOSCOPY  09/30/2010, 03/01/2014   diverticulosis, small internal hemorrhoids   COLONOSCOPY W/ POLYPECTOMY  04/03/2010   4 polyps 8-55mm, worst TV adenoma with high-grade dysplasia   ESOPHAGOGASTRODUODENOSCOPY N/A 08/02/2023   Procedure: EGD (ESOPHAGOGASTRODUODENOSCOPY);  Surgeon: [name], MD;  Location: [facility] ENDOSCOPY;  Service: Gastroenterology;  Laterality: N/A;   LOOP RECORDER INSERTION N/A 10/10/2022   Procedure: LOOP RECORDER INSERTION;  Surgeon: Marinus Maw, MD;  Location: [facility] INVASIVE CV LAB;  Service: Cardiovascular;  Laterality: N/A;   NO PAST SURGERIES     POLYPECTOMY     POSTERIOR CERVICAL FUSION/FORAMINOTOMY Left 10/20/2012   Procedure: C/5-6,C/6-7 Lami/Multi level,POSTERIOR CERVICAL FUSION/FORAMINOTOMY C/4-7;  Surgeon: Tia Alert, MD;   Location: [facility] NEURO ORS;  Service: Neurosurgery;  Laterality: Left;  Cervical Five-Six, Cervical Six-Seven Laminectomies, Posterior Cervical Fusion/Foraminotomies Cervical Four through Seven.    TONSILLECTOMY      Home Medications:  Prior to Admission medications   Medication Sig Start Date End Date Taking? Authorizing Provider  aspirin 81 MG chewable tablet Chew 1 tablet (81 mg total) by mouth daily. 04/08/23  Yes Marinda Elk, MD  atorvastatin (LIPITOR) 40 MG tablet Take 1 tablet (40 mg total) by mouth daily. Patient taking differently: Take 40 mg by mouth at bedtime. 10/17/22  Yes Love, Evlyn Kanner, PA-C  benazepril (LOTENSIN) 40 MG tablet Take 40 mg by mouth daily.   Yes [provider]  BRILINTA 90 MG TABS tablet Take 90 mg by mouth 2 (two) times daily. 04/24/23  Yes [provider]  chlorthalidone (HYGROTON) 25 MG tablet Take 25 mg by mouth daily.   Yes [provider]  Cholecalciferol (VITAMIN D3) 1000 units CAPS Take 1,000 Units by mouth in the morning and at bedtime.   Yes [provider]  Continuous Glucose Sensor (FREESTYLE LIBRE 14 DAY SENSOR) MISC Inject 1 Device into the skin every  14 (fourteen) days.   Yes [provider]  escitalopram (LEXAPRO) 20 MG tablet Take 20 mg by mouth daily.   Yes [provider]  ezetimibe (ZETIA) 10 MG tablet Take 10 mg by mouth daily. 01/19/20  Yes [provider]  metFORMIN (GLUCOPHAGE-XR) 500 MG 24 hr tablet Take 500 mg by mouth in the morning and at bedtime.   Yes [provider]  tamsulosin (FLOMAX) 0.4 MG CAPS capsule Take 0.4 mg by mouth daily. 07/31/23  Yes [provider]  TOUJEO SOLOSTAR 300 UNIT/ML Solostar Pen Inject 40 Units into the skin in the morning. 04/08/23  Yes Marinda Elk, MD  MOUNJARO 5 MG/0.5ML Pen Inject 5 mg into the skin every Monday. Patient not taking: Reported on 08/01/2023    [provider]   Inpatient Medications: Scheduled  Meds:  aspirin  81 mg Oral Daily   atorvastatin  40 mg Oral QHS   vitamin B-12  1,000 mcg Oral Daily   ezetimibe  10 mg Oral Daily   ferrous sulfate  325 mg Oral BID WC   folic acid  1 mg Oral Daily   Gerhardt's butt cream   Topical BID   insulin aspart  0-15 Units Subcutaneous TID WC   insulin aspart  0-5 Units Subcutaneous QHS   insulin glargine-yfgn  30 Units Subcutaneous Q1400   megestrol  40 mg Oral Daily   mirtazapine  7.5 mg Oral QHS   pantoprazole  40 mg Oral BID AC   tamsulosin  0.4 mg Oral QPM   thiamine  100 mg Oral q AM   venlafaxine XR  37.5 mg Oral Q breakfast   Continuous Infusions:  PRN Meds: acetaminophen **OR** acetaminophen, albuterol, bisacodyl, menthol-cetylpyridinium, [DISCONTINUED] ondansetron **OR** ondansetron (ZOFRAN) IV, phenol  Allergies:    Allergies  Allergen Reactions   Jardiance [Empagliflozin] Other (See Comments)    To the hospital- due to complications   Fluoxetine Other (See Comments)    Prefers to NOT take this   Penicillins Other (See Comments)    CHILDHOOD ALLERGY- exact reaction? Has patient had a PCN reaction causing immediate rash, facial/tongue/throat swelling, SOB or lightheadedness with hypotension: Unknown Has patient had a PCN reaction causing severe rash involving mucus membranes or skin necrosis: Unknown Has patient had a PCN reaction that required hospitalization: Unknown Has patient had a PCN reaction occurring within the last 10 years: Unknown If all of the above answers are "NO", then may proceed with Cephalosporin use.     Silodosin Other (See Comments)    Vertigo    Social History:   Social History   Socioeconomic History   Marital status: Married    Spouse name: Not on file   Number of children: Not on file   Years of education: Not on file   Highest education level: Not on file  Occupational History   Not on file  Tobacco Use   Smoking status: Former    Current packs/day: 0.00    Average packs/day: 0.3  packs/day for 2.0 years (0.5 ttl pk-yrs)    Types: Cigarettes    Start date: 09/23/1967    Quit date: 09/22/1969    Years since quitting: 53.9   Smokeless tobacco: Never  Vaping Use   Vaping status: Never Used  Substance and Sexual Activity   Alcohol use: No   Drug use: No   Sexual activity: Yes  Other Topics Concern   Not on file  Social History Narrative   Right handed  Wear glasses   Social Drivers of Corporate investment banker Strain: Not on file  Food Insecurity: No Food Insecurity (08/01/2023)   Hunger Vital Sign    Worried About Running Out of Food in the Last Year: Never true    Ran Out of Food in the Last Year: Never true  Transportation Needs: No Transportation Needs (08/01/2023)   PRAPARE - Administrator, Civil Service (Medical): No    Lack of Transportation (Non-Medical): No  Physical Activity: Not on file  Stress: Not on file  Social Connections: Socially Isolated (08/01/2023)   Social Connection and Isolation Panel [NHANES]    Frequency of Communication with Friends and Family: Never    Frequency of Social Gatherings with Friends and Family: Never    Attends Religious Services: Never    Database administrator or Organizations: No    Attends Banker Meetings: Never    Marital Status: Married  Catering manager Violence: Not At Risk (08/01/2023)   Humiliation, Afraid, Rape, and Kick questionnaire    Fear of Current or Ex-Partner: No    Emotionally Abused: No    Physically Abused: No    Sexually Abused: No    Family History:   Family History  Problem Relation Age of Onset   Diabetes Mother    Stomach cancer Neg Hx    Rectal cancer Neg Hx    Pancreatic cancer Neg Hx    Colon cancer Neg Hx    Colon polyps Neg Hx    Esophageal cancer Neg Hx    Prostate cancer Neg Hx    Breast cancer Neg Hx     ROS:  Please see the history of present illness.  All other ROS reviewed and negative.     Physical Exam/Data:   Vitals:   08/05/23  2309 08/06/23 0316 08/06/23 0723 08/06/23 1231  BP: (!) 120/55 (!) 116/56 110/67   Pulse: 74 75 68   Resp: (!) 21 20 (!) 21 20  Temp: 97.8 F (36.6 C) 97.6 F (36.4 C) 98.1 F (36.7 C) 97.7 F (36.5 C)  TempSrc: Oral Oral Oral Oral  SpO2: 92% 92% 95%   Weight:      Height:        Intake/Output Summary (Last 24 hours) at 08/06/2023 1415 Last data filed at 08/06/2023 0530 Gross per 24 hour  Intake 637.82 ml  Output 1600 ml  Net -962.18 ml      08/01/2023    6:28 AM 05/21/2023    7:59 AM 05/01/2023   10:00 AM  Last 3 Weights  Weight (lbs) 191 lb 195 lb 8 oz 196 lb 12.8 oz  Weight (kg) 86.637 kg 88.678 kg 89.268 kg     Body mass index is 26.64 kg/m.   General: drowsy, opening eyes and answering questions just briefly before going back to sleep  HEENT: normal Neck: no JVD Vascular:  Distal pulses 2+ bilaterally Cardiac:  normal S1, S2; RRR; no murmur  Lungs:  clear to auscultation bilaterally, no wheezing, rhonchi or rales  Abd: soft, non-tender Ext: no edema Musculoskeletal:  No deformities Skin: warm and dry   EKG:  The EKG was personally reviewed and demonstrates:  sinus rhythm, HR 66, PAC  Telemetry:  Telemetry was personally reviewed and demonstrates:  sinus rhythm, HR 70s, PVCs  Relevant CV Studies: Echocardiogram 08/06/2023 IMPRESSIONS   1. Left ventricular ejection fraction, by estimation, is 60 to 65%. The left ventricle has normal function. The left  ventricle has no regional wall motion abnormalities. Left ventricular diastolic parameters are consistent with Grade I diastolic dysfunction (impaired relaxation).   2. Right ventricular systolic function is normal. The right ventricular size is normal. Tricuspid regurgitation signal is inadequate for assessing PA pressure.   3. The mitral valve was not well visualized. No evidence of mitral valve regurgitation. No evidence of mitral stenosis.   4. The aortic valve was not well visualized. There is mild calcification  of the aortic valve. Aortic valve regurgitation is not visualized. Aortic valve sclerosis is present, with no evidence of aortic valve stenosis.  Aortic valve Vmax measures 2.19 m/s.   5. The inferior vena cava is normal in size with greater than 50% respiratory variability, suggesting right atrial pressure of 3 mmHg.    Laboratory Data:  High Sensitivity Troponin:   Recent Labs  Lab 08/01/23 0630 08/01/23 0913  TROPONINIHS 5 4     Chemistry Recent Labs  Lab 08/05/23 0457 08/06/23 0018 08/06/23 0509  NA 132* 132* 134*  K 3.5 3.6 3.6  CL 101 102 102  CO2 23 23 25   GLUCOSE 136* 157* 140*  BUN 29* 31* 32*  CREATININE 0.88 0.94 0.96  CALCIUM 8.0* 7.7* 8.1*  MG 1.8 1.8 1.8  GFRNONAA >60 >60 >60  ANIONGAP 8 7 7     Recent Labs  Lab 08/04/23 0452 08/05/23 0457 08/06/23 0509  PROT 4.6* 4.7* 4.5*  ALBUMIN 2.2* 2.2* 2.1*  AST 47* 123* 206*  ALT 40 123* 186*  ALKPHOS 60 91 142*  BILITOT 1.3* 1.5* 1.5*   Lipids No results for input(s): "CHOL", "TRIG", "HDL", "LABVLDL", "LDLCALC", "CHOLHDL" in the last 168 hours.  Hematology Recent Labs  Lab 08/05/23 0457 08/06/23 0018 08/06/23 0509  WBC 9.0 9.8 9.2  RBC 3.04* 2.93* 3.00*  HGB 9.0* 8.7* 8.9*  HCT 26.3* 24.9* 25.6*  MCV 86.5 85.0 85.3  MCH 29.6 29.7 29.7  MCHC 34.2 34.9 34.8  RDW 15.3 15.2 15.2  PLT 97* 95* 97*   Thyroid  Recent Labs  Lab 08/01/23 1310  TSH 1.499  FREET4 1.06    BNP Recent Labs  Lab 08/04/23 0452 08/05/23 0500 08/06/23 0509  BNP 27.8 41.4 31.9    DDimer No results for input(s): "DDIMER" in the last 168 hours.  Radiology/Studies:  ECHOCARDIOGRAM COMPLETE Result Date: 08/06/2023    ECHOCARDIOGRAM REPORT   Patient Name:   Kenneth [name] Date of Exam: 08/06/2023 Medical Rec #:  [id number]        Height:       71.0 in Accession #:    [id number]       Weight:       191.0 lb Date of Birth:  [birth year]        BSA:          2.068 m Patient Age:    72 years         BP:           110/87 mmHg  Patient Gender: M                HR:           72 bpm. Exam Location:  Inpatient Procedure: 2D Echo, Cardiac Doppler, Color Doppler and Intracardiac            Opacification Agent (Both Spectral and Color Flow Doppler were            utilized during procedure). Indications:    Stroke  History:  Patient has prior history of Echocardiogram examinations, most                 recent 04/07/2023. CHF, Stroke, Signs/Symptoms:Syncope; Risk                 Factors:Dyslipidemia, Diabetes, Hypertension and Sleep Apnea.  Sonographer:    Sheralyn Boatman RDCS Referring Phys: [id number] DENISE A WOLFE  Sonographer Comments: Technically difficult study due to poor echo windows, Technically challenging study due to limited acoustic windows, suboptimal parasternal window, suboptimal apical window and suboptimal subcostal window. Image acquisition challenging due to respiratory motion. Suboptimal windows despite attempts to turn patient. IMPRESSIONS  1. Left ventricular ejection fraction, by estimation, is 60 to 65%. The left ventricle has normal function. The left ventricle has no regional wall motion abnormalities. Left ventricular diastolic parameters are consistent with Grade I diastolic dysfunction (impaired relaxation).  2. Right ventricular systolic function is normal. The right ventricular size is normal. Tricuspid regurgitation signal is inadequate for assessing PA pressure.  3. The mitral valve was not well visualized. No evidence of mitral valve regurgitation. No evidence of mitral stenosis.  4. The aortic valve was not well visualized. There is mild calcification of the aortic valve. Aortic valve regurgitation is not visualized. Aortic valve sclerosis is present, with no evidence of aortic valve stenosis. Aortic valve Vmax measures 2.19 m/s.  5. The inferior vena cava is normal in size with greater than 50% respiratory variability, suggesting right atrial pressure of 3 mmHg. FINDINGS  Left Ventricle: Left ventricular ejection  fraction, by estimation, is 60 to 65%. The left ventricle has normal function. The left ventricle has no regional wall motion abnormalities. Definity contrast agent was given IV to delineate the left ventricular  endocardial borders. The left ventricular internal cavity size was normal in size. There is no left ventricular hypertrophy. Left ventricular diastolic parameters are consistent with Grade I diastolic dysfunction (impaired relaxation). Right Ventricle: The right ventricular size is normal. No increase in right ventricular wall thickness. Right ventricular systolic function is normal. Tricuspid regurgitation signal is inadequate for assessing PA pressure. Left Atrium: Left atrial size was normal in size. Right Atrium: Right atrial size was normal in size. Pericardium: There is no evidence of pericardial effusion. Mitral Valve: The mitral valve was not well visualized. No evidence of mitral valve regurgitation. No evidence of mitral valve stenosis. Tricuspid Valve: The tricuspid valve is not well visualized. Tricuspid valve regurgitation is not demonstrated. No evidence of tricuspid stenosis. Aortic Valve: The aortic valve was not well visualized. There is mild calcification of the aortic valve. Aortic valve regurgitation is not visualized. Aortic valve sclerosis is present, with no evidence of aortic valve stenosis. Aortic valve mean gradient measures 9.0 mmHg. Aortic valve peak gradient measures 19.2 mmHg. Aortic valve area, by VTI measures 2.56 cm. Pulmonic Valve: The pulmonic valve was normal in structure. Pulmonic valve regurgitation is not visualized. No evidence of pulmonic stenosis. Aorta: The aortic root is normal in size and structure. Venous: The inferior vena cava is normal in size with greater than 50% respiratory variability, suggesting right atrial pressure of 3 mmHg. IAS/Shunts: No atrial level shunt detected by color flow Doppler.  LEFT VENTRICLE PLAX 2D LVIDd:         4.90 cm LVIDs:          3.50 cm LV PW:         1.00 cm LV IVS:        0.90 cm LVOT diam:  2.30 cm LV SV:         91 LV SV Index:   44 LVOT Area:     4.15 cm  LV Volumes (MOD) LV vol d, MOD A2C: 95.7 ml LV vol d, MOD A4C: 108.0 ml LV vol s, MOD A2C: 46.4 ml LV vol s, MOD A4C: 38.6 ml LV SV MOD A2C:     49.3 ml LV SV MOD A4C:     108.0 ml LV SV MOD BP:      67.9 ml RIGHT VENTRICLE             IVC RV S prime:     14.00 cm/s  IVC diam: 1.90 cm TAPSE (M-mode): 3.2 cm LEFT ATRIUM             Index        RIGHT ATRIUM           Index LA diam:        3.50 cm 1.69 cm/m   RA Area:     10.90 cm LA Vol (A2C):   41.3 ml 19.97 ml/m  RA Volume:   19.40 ml  9.38 ml/m LA Vol (A4C):   62.6 ml 30.28 ml/m LA Biplane Vol: 51.3 ml 24.81 ml/m  AORTIC VALVE AV Area (Vmax):    2.16 cm AV Area (Vmean):   2.48 cm AV Area (VTI):     2.56 cm AV Vmax:           219.00 cm/s AV Vmean:          139.000 cm/s AV VTI:            0.355 m AV Peak Grad:      19.2 mmHg AV Mean Grad:      9.0 mmHg LVOT Vmax:         114.00 cm/s LVOT Vmean:        83.100 cm/s LVOT VTI:          0.219 m LVOT/AV VTI ratio: 0.62  AORTA Ao Root diam: 3.10 cm MITRAL VALVE MV Area (PHT): 3.08 cm    SHUNTS MV Decel Time: 246 msec    Systemic VTI:  0.22 m MV E velocity: 82.70 cm/s  Systemic Diam: 2.30 cm MV A velocity: 90.30 cm/s MV E/A ratio:  0.92 Donato Schultz MD Electronically signed by Donato Schultz MD Signature Date/Time: 08/06/2023/1:02:26 PM    Final    DG Chest Port 1 View Result Date: 08/05/2023 CLINICAL DATA:  Shortness of breath. EXAM: PORTABLE CHEST 1 VIEW COMPARISON:  August 04, 2023. FINDINGS: Stable cardiomediastinal silhouette. Left lung is clear. Stable right basilar opacities are noted concerning for subsegmental atelectasis or possibly pneumonia. Bony thorax is unremarkable. IMPRESSION: Stable right basilar opacities as noted above. Electronically Signed   By: Lupita Raider M.D.   On: 08/05/2023 11:23   MR CERVICAL SPINE W WO CONTRAST Result Date: 08/04/2023 CLINICAL  DATA:  hyperreflexia and worsening neurological status in setting of prior C-spine surgery EXAM: MRI CERVICAL SPINE WITHOUT AND WITH CONTRAST TECHNIQUE: Multiplanar and multiecho pulse sequences of the cervical spine, to include the craniocervical junction and cervicothoracic junction, were obtained without and with intravenous contrast. CONTRAST:  8.86mL GADAVIST GADOBUTROL 1 MMOL/ML IV SOLN COMPARISON:  None Available. FINDINGS: Moderately motion limited study. Alignment: No substantial sagittal subluxation. Vertebrae: Motion limited evaluation. Edema involving the left T1 inferior articular facet. C5-C7 ACDF. Cord: Cord T2/hyperintensity from C2 to C3-C4. Posterior Fossa, vertebral arteries, paraspinal tissues: Visualized vertebral artery  flow voids are maintained. No significant paraspinal edema. Disc levels: C2-C3: No significant disc protrusion, foraminal stenosis, or canal stenosis. C3-C4: Posterior disc osteophyte complex with ligamentum flavum thickening and bilateral facet and uncovertebral hypertrophy. Resulting severe canal and left foraminal stenosis. Moderate right foraminal stenosis. C4-C5: Small posterior disc osteophyte complex and left greater than right facet and uncovertebral hypertrophy. Mild left foraminal stenosis. Patent canal. C5-C6: ACDF.  Patent canal and foramina. C6-C7: ACDF.  Patent canal and foramina. C7-T1: Small posterior disc osteophyte complex and bilateral facet and uncovertebral hypertrophy. Patent canal and foramina. IMPRESSION: 1. 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 Cord T2 hyperintensity superior to this level, suspicious for edema and compressive myelopathy. Severe left and moderate right foraminal stenosis at this level. 2. Edema involving the left T1 inferior articular facet, which is nonspecific but most likely related to stress. A CT could better assess bone detail and exclude fracture if clinically warranted. Electronically Signed   By: Feliberto Harts M.D.   On: 08/04/2023  20:53   MR THORACIC SPINE WO CONTRAST Result Date: 08/04/2023 CLINICAL DATA:  Mid-back pain, neuro deficit EXAM: MRI THORACIC SPINE WITHOUT CONTRAST TECHNIQUE: Multiplanar, multisequence MR imaging of the thoracic spine was performed. No intravenous contrast was administered. COMPARISON:  CT of the chest February 27, 2017. FINDINGS: Alignment:  No substantial sagittal subluxation. Vertebrae: Sclerotic lesion at T9 was present in 2018 and therefore likely is benign. No suspicious bone lesions. No bone marrow edema to suggest acute fracture. Vertebral body heights are maintained. Cord:  Normal cord signal. Paraspinal and other soft tissues: Motion limited evaluation without definite abnormality. Disc levels: No significant canal or foraminal stenosis. IMPRESSION: No evidence of acute abnormality or significant stenosis. Electronically Signed   By: Feliberto Harts M.D.   On: 08/04/2023 19:54   MR BRAIN WO CONTRAST Result Date: 08/04/2023 CLINICAL DATA:  Stroke, follow up EXAM: MRI HEAD WITHOUT CONTRAST TECHNIQUE: Multiplanar, multiecho pulse sequences of the brain and surrounding structures were obtained without intravenous contrast. COMPARISON:  CT head earlier today. 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 Associated Dema without substantial mass effect. No midline shift. No evidence of acute hemorrhage, mass lesion, or hydrocephalus. Vascular: Major arterial flow voids are maintained at the skull base. Skull and upper cervical spine: Normal marrow signal. Sinuses/Orbits: Negative. IMPRESSION: Many small acute infarcts in the bilateral frontal and parietal lobes, left thalamus, right occipital lobe, and cerebellum. Given involvement of multiple vascular territories, consider an embolic etiology. Electronically Signed   By: Feliberto Harts M.D.   On: 08/04/2023 19:25   DG Chest Port 1 View Result Date:  08/04/2023 CLINICAL DATA:  [id number] SOB (shortness of breath) [id number] EXAM: PORTABLE CHEST 1 VIEW COMPARISON:  08/01/2023 FINDINGS: Left chest wall loop recorder. Stable heart size. Low lung volumes. Chronic pleural thickening at the right costophrenic angle. Increased streaky bibasilar opacities. No definite pleural effusion. No pneumothorax. IMPRESSION: Low lung volumes with increased streaky bibasilar opacities, which may represent atelectasis versus pneumonia. Electronically Signed   By: Duanne Guess D.O.   On: 08/04/2023 12:00   EEG adult Result Date: 08/04/2023 Charlsie Quest, MD     08/04/2023 12:10 PM Patient Name: Kenneth [name] MRN: [id number] Epilepsy Attending: Charlsie Quest Referring Physician/Provider: Leroy Sea, MD Date: 08/04/2023 Duration: 25.23 mins Patient history: 72 year old male with altered mental status.  EEG to alert for seizure. Level of alertness: Awake AEDs during EEG study:  None Technical aspects: This EEG study was done with scalp electrodes positioned according to the 10-20 International system of electrode placement. Electrical activity was reviewed with band pass filter of 1-70Hz , sensitivity of 7 uV/mm, display speed of 68mm/sec with a 60Hz  notched filter applied as appropriate. EEG data were recorded continuously and digitally stored.  Video monitoring was available and reviewed as appropriate. Description: The posterior dominant rhythm consists of 7 Hz activity of moderate voltage (25-35 uV) seen predominantly in posterior head regions, symmetric and reactive to eye opening and eye closing. EEG showed continuous generalized 6 to 7 Hz theta slowing. Hyperventilation and photic stimulation were not performed.   ABNORMALITY - Continuous slow, generalized IMPRESSION: This study is suggestive of mild diffuse encephalopathy. No seizures or epileptiform discharges were seen throughout the recording. Priyanka Annabelle Harman   CT HEAD WO CONTRAST ( ) Result Date:  08/04/2023 CLINICAL DATA:  Provided history: Memory loss. EXAM: CT HEAD WITHOUT CONTRAST TECHNIQUE: Contiguous axial images were obtained from the base of the skull through the vertex without intravenous contrast. RADIATION DOSE REDUCTION: This exam was performed according to the departmental dose-optimization program which includes automated exposure control, adjustment of the mA and/or kV according to patient size and/or use of iterative reconstruction technique. COMPARISON:  Non-contrast head CT and CT angiogram head 04/06/2023. Brain MRI 04/06/2023. FINDINGS: Brain: Moderate generalized cerebral atrophy. 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 Small chronic cortical/subcortical infarct within the mid right frontal lobe, new from the prior head CT of 04/06/2023. There is no acute intracranial hemorrhage. No acute demarcated cortical infarct. No extra-axial fluid collection. No evidence of an intracranial mass. No midline shift. Vascular: No hyperdense vessel. Atherosclerotic calcifications. Skull: No calvarial fracture or aggressive osseous lesion. Sinuses/Orbits: No mass or acute finding within the imaged orbits. Minimal mucosal thickening within the bilateral maxillary and right sphenoid sinuses. Mild mucosal thickening within the bilateral ethmoid sinuses. Minimal mucosal thickening within the left frontal sinus. IMPRESSION: 1. No evidence of an acute intracranial abnormality. 2. 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 3. 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 4. Moderate generalized cerebral atrophy. 5. Paranasal sinus disease as described. Electronically Signed   By: Jackey Loge D.O.   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  Neurosurgery consulted and determined patient would likely need C-spine decompressive surgery  Cardiology was consulted for pre-op evaluation Echo this admission showed: EF 60-65%, no LV RWMA, G1DD, normal RV function, aortic sclerosis without stenosis, normal IVC No active cardiac symptoms (chest pain, dyspnea, syncope, palpitations) No history of recent ACS, decompensated heart failure, significant known arrhythmias or severe valvular disease -- waiting on updated interrogation from [facility] to confirm  Stable functional status: prior to this admission his wife note that he was able to possibly perform > 4 METs, she states that she believes he was able to get around and do most ADLs and was using a walker/cane. The PT notes from this admission show severe decline in functional status noting that he was a max assist x 2 and was unable to get him to be fully upright and  maintain his balance even with the assists RCRI is 2, indicating perioperative risk of major cardiac event is 6.6% It does not appear that there are any cardiac limitations to this surgery however the patient has had a decent decline in functional status and seems to be dealing with significant depression which would definitely impact his recover process No additional cardiac testing is required prior to surgery as he appears to be at least moderate but not prohibitive risk and additional testing would not change the current plan If the patient is accepting of his current risks and neurosurgery determines that they are comfortable operating on him in his current state then he could proceed, from a cardiac standpoint  If new symptoms develop, reassess as needed  Hypercoagulable state  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 Candise Che in hematology in 05/2023 via telemedicine visit He was referred due to his recurrent strokes  He was on ASA + Brilinta  At this visit he complained of neck pain, lethargy, bilateral leg weakness and noted he had lost 20 lbs in 3-4 weeks At this visit with hematology they discussed continuing ASA + Brilinta In their note they stated; if there was a need for surgery or other situation that would increase risk of blood clots there would be recommendations in regards to how to reduce the risk of blood clots Would recommend discussing with hematology prior to surgery    History of recent strokes Subacute to acute cryptogenic strokes Gradually progressive generalized weakness  Patient presented to the hospital with progressive generalized weakness History of multiple strokes May 2024, November 2024, March 2025 Brain MRI this admission showed: many small acute infarcts in bilateral frontal and parietal lobes, left thalamus, right occipital lobe, and cerebellum. Thought to be embolic in nature. Being followed closely by neurology and neurosurgery   Per primary Anemia of chronic disease Dehydration, poor PO intake Hypomagnesemia  Hyponatremia B12 deficiency  Metabolic encephalopathy  Depression BPH Recent right ankle sprain Transaminitis  Diabetes  OSA not using CPAP  For questions or updates, please contact [facility] Please consult [email or web address] for contact info under    Signed, Olena Leatherwood, PA-C  08/06/2023 2:15 PM   Patient seen and examined.  Agree with above documentation.  Mr. Knightly is a 73 year old male with a history of multiple strokes, prostate cancer, IDDM, OSA, cervical stenosis who we are consulted for preop evaluation by Dr. Thedore Mins.  He was admitted with worsening weakness and found to have cervical spinal compression as well as subacute stroke.  He had a stroke in November 2024, prior to this his wife  reports he was very active.  He initially required using a walker after his stroke but was now just using a cane.  Up until a few weeks ago, could go up a flight of stairs without any issues, denied any chest pain or dyspnea.  However 10 days ago he had a fall while shopping and sprained his ankle.  He has been back to using his walker since then.  Most recent vital signs notable for BP 110/67, pulse 60, SpO2 95% on room air.  Labs notable for sodium 134, creatinine 0.96, albumin 2.1, AST 206, ALT 186, hemoglobin 8.9, platelets 97, WBC 9.2.  EKG shows sinus rhythm with PACs, low voltage, rate 66.  Echocardiogram today shows EF 60 to 65%, normal RV function, no significant valvular disease.  On exam, patient is alert and  oriented, regular rate and rhythm, no murmurs, lungs CTAB, no LE edema or JVD.  For his preop evaluation, he denies any anginal symptoms.  RCRI score 2 given history of CVA, insulin-dependent diabetes.  Functional capacity appears greater than 4 METS up until recent injury to his ankle.  Overall would classify as at least moderate risk for an intermediate risk surgery but no further cardiac workup recommended prior to procedure.  Given his hypercoagulable disorder would recommend discussing with his hematologist to minimize clotting risk perioperatively.  Little Ishikawa, MD

## 2023-08-06 NOTE — Progress Notes (Signed)
 Physical Therapy Treatment Patient Details Name: Kenneth [name] MRN: [id number] DOB: 1951/01/18 Today's Date: 08/06/2023   History of Present Illness 73 y.o. male [facility] to [facility] 08/01/23 with  2 day gradual progressive weakness, was also not eating or drinking well for a few days. Pt with progressive anemia, dehydration, hypomagnesemia, and B12 deficiency. 3/25 EEG showed mild diffuse encephalopathy. MRI showed severe cervical stenosis at C3/C4 with altered signal and new bilateral cryptogenic strokes. PMHx: CVA, currently on DAPT, dyslipidemia, BPH, prostate cancer, insulin-dependent DM type II, HTN, cervical stenosis, history of iron deficiency anemia, history of OSA does not wear CPAP, recent history of right ankle strain 2 weeks ago. CT - negative MRI - small acute to subacute infarcts L frontal and parietal lobes and R temporal white matter    PT Comments  Pt was lethargic during the session and required cues to open eyes and adhere to tasks. Pt required TotalAx2 for bed mobility as pt was unable to assist physically. Pt has a significant right lateral lean upon seated at the EOB and required MaxA to maintain balance. Attempted x3 stands with TotalAx2 and use of bed pad, gait belt and Stedy. Pt was unable to achieve an upright posture or maintain balance with TotalAx2. Pt is progressing slowly towards goals. Pt would continue to benefit from <3hrs post acute rehab to work on mobility. Acute PT to follow.      If plan is discharge home, recommend the following: Two people to help with walking and/or transfers;A lot of help with bathing/dressing/bathroom;Assistance with cooking/housework;Direct supervision/assist for medications management;Assist for transportation;Help with stairs or ramp for entrance   Can travel by private vehicle     No  Equipment Recommendations  Hoyer lift;Hospital bed;Wheelchair cushion (measurements PT);Wheelchair (measurements PT);BSC/3in1    Recommendations for  Other Services       Precautions / Restrictions Precautions Precautions: Fall;Other (comment) Precaution/Restrictions Comments: R ankle/ RLE swollen Required Braces or Orthoses: Other Brace Other Brace: R ankle ACE wrap in place Restrictions Weight Bearing Restrictions Per Provider Order: No     Mobility  Bed Mobility Overal bed mobility: Needs Assistance Bed Mobility: Rolling, Supine to Sit, Sit to Supine Rolling: Total assist, +2 for physical assistance   Supine to sit: Total assist, +2 for physical assistance, +2 for safety/equipment Sit to supine: Total assist, +2 for physical assistance, +2 for safety/equipment   General bed mobility comments: TotalAx2 for all aspects of mobility with use of bed pad    Transfers Overall transfer level: Needs assistance Equipment used: Ambulation equipment used Transfers: Sit to/from Stand Sit to Stand: Total assist, +2 physical assistance, +2 safety/equipment, Via lift equipment      General transfer comment: x3 attempts at standing with TotalAx2 and use of bed pad and gait belt. Pt unable to maintain upright posture. Needed assist to move LE's onto foot plate and grab bar. Transfer via Lift Equipment: Stedy    Balance Overall balance assessment: Needs assistance Sitting-balance support: Bilateral upper extremity supported, Feet supported Sitting balance-Leahy Scale: Poor Sitting balance - Comments: heavy right lateral lean, requiring MaxA Postural control: Right lateral lean Standing balance support: Bilateral upper extremity supported, During functional activity Standing balance-Leahy Scale: Zero Standing balance comment: unable to maintain balance without TotalAx2       Communication Communication Communication: Impaired Factors Affecting Communication: Hearing impaired  Cognition Arousal: Lethargic Behavior During Therapy: Flat affect   PT - Cognitive impairments: Memory, Awareness, Initiation, Sequencing, Problem  solving  Following commands: Impaired Following commands impaired: Follows one step commands with increased time, Follows one step commands inconsistently    Cueing Cueing Techniques: Verbal cues, Tactile cues, Gestural cues  Exercises Other Exercises Other Exercises: seated balance at EOB, cues for upright posture and to align self with PT        Pertinent Vitals/Pain Pain Assessment Pain Assessment: Faces Faces Pain Scale: Hurts even more Pain Location: with movement of B LE and neck Pain Descriptors / Indicators: Discomfort, Grimacing, Moaning, Guarding Pain Intervention(s): Limited activity within patient's tolerance, Monitored during session, Repositioned     PT Goals (current goals can now be found in the care plan section) Acute Rehab PT Goals PT Goal Formulation: With patient Time For Goal Achievement: 08/16/23 Potential to Achieve Goals: Fair Progress towards PT goals: Not progressing toward goals - comment (slow progress, lethargic)    Frequency    Min 1X/week       AM-PAC PT "6 Clicks" Mobility   Outcome Measure  Help needed turning from your back to your side while in a flat bed without using bedrails?: Total Help needed moving from lying on your back to sitting on the side of a flat bed without using bedrails?: Total Help needed moving to and from a bed to a chair (including a wheelchair)?: Total Help needed standing up from a chair using your arms (e.g., wheelchair or bedside chair)?: Total Help needed to walk in hospital room?: Total Help needed climbing 3-5 steps with a railing? : Total 6 Click Score: 6    End of Session Equipment Utilized During Treatment: Gait belt Activity Tolerance: Patient limited by pain;Patient limited by fatigue Patient left: in bed;with call bell/phone within reach;with bed alarm set Nurse Communication: Mobility status PT Visit Diagnosis: Other abnormalities of gait and mobility (R26.89)     Time:  1610-9604 PT Time Calculation (min) (ACUTE ONLY): 20 min  Charges:    $Therapeutic Activity: 8-22 mins PT General Charges $$ ACUTE PT VISIT: 1 Visit                     Hilton Cork, PT, DPT Secure Chat Preferred  Rehab Office [phone]   Arturo Morton Brion Aliment 08/06/2023, 11:00 AM

## 2023-08-06 NOTE — Progress Notes (Signed)
 STROKE TEAM PROGRESS NOTE   INTERIM HISTORY/SUBJECTIVE    Family is not  at the bedside.  Patient is awake and alert he is oriented to hospital, self and follows simple midline and one-step commands.  CT chest abdomen pelvis done but not resulted yet. OBJECTIVE  CBC    Component Value Date/Time   WBC 9.2 08/06/2023 0509   RBC 3.00 (L) 08/06/2023 0509   HGB 8.9 (L) 08/06/2023 0509   HGB 12.3 (L) 05/01/2023 1204   HCT 25.6 (L) 08/06/2023 0509   PLT 97 (L) 08/06/2023 0509   PLT 249 05/01/2023 1204   MCV 85.3 08/06/2023 0509   MCH 29.7 08/06/2023 0509   MCHC 34.8 08/06/2023 0509   RDW 15.2 08/06/2023 0509   LYMPHSABS 1.1 08/06/2023 0509   MONOABS 2.2 (H) 08/06/2023 0509   EOSABS 0.0 08/06/2023 0509   BASOSABS 0.1 08/06/2023 0509    BMET    Component Value Date/Time   NA 134 (L) 08/06/2023 0509   NA 142 04/06/2017 1057   K 3.6 08/06/2023 0509   CL 102 08/06/2023 0509   CO2 25 08/06/2023 0509   GLUCOSE 140 (H) 08/06/2023 0509   BUN 32 (H) 08/06/2023 0509   BUN 13 04/06/2017 1057   CREATININE 0.96 08/06/2023 0509   CREATININE 0.94 05/01/2023 1204   CALCIUM 8.1 (L) 08/06/2023 0509   GFRNONAA >60 08/06/2023 0509   GFRNONAA >60 05/01/2023 1204    IMAGING past 24 hours ECHOCARDIOGRAM COMPLETE Result Date: 08/06/2023    ECHOCARDIOGRAM REPORT   Patient Name:   Kenneth [name] Date of Exam: 08/06/2023 Medical Rec #:  [id number]        Height:       71.0 in Accession #:    [id number]       Weight:       191.0 lb Date of Birth:  15-Jul-1950        BSA:          2.068 m Patient Age:    73 years         BP:           110/87 mmHg Patient Gender: M                HR:           72 bpm. Exam Location:  Inpatient Procedure: 2D Echo, Cardiac Doppler, Color Doppler and Intracardiac            Opacification Agent (Both Spectral and Color Flow Doppler were            utilized during procedure). Indications:    Stroke  History:        Patient has prior history of Echocardiogram examinations, most                  recent 04/07/2023. CHF, Stroke, Signs/Symptoms:Syncope; Risk                 Factors:Dyslipidemia, Diabetes, Hypertension and Sleep Apnea.  Sonographer:    Sheralyn Boatman RDCS Referring Phys: [id number] DENISE A WOLFE  Sonographer Comments: Technically difficult study due to poor echo windows, Technically challenging study due to limited acoustic windows, suboptimal parasternal window, suboptimal apical window and suboptimal subcostal window. Image acquisition challenging due to respiratory motion. Suboptimal windows despite attempts to turn patient. IMPRESSIONS  1. Left ventricular ejection fraction, by estimation, is 60 to 65%. The left ventricle has normal function. The left ventricle has no regional wall motion abnormalities. Left ventricular  diastolic parameters are consistent with Grade I diastolic dysfunction (impaired relaxation).  2. Right ventricular systolic function is normal. The right ventricular size is normal. Tricuspid regurgitation signal is inadequate for assessing PA pressure.  3. The mitral valve was not well visualized. No evidence of mitral valve regurgitation. No evidence of mitral stenosis.  4. The aortic valve was not well visualized. There is mild calcification of the aortic valve. Aortic valve regurgitation is not visualized. Aortic valve sclerosis is present, with no evidence of aortic valve stenosis. Aortic valve Vmax measures 2.19 m/s.  5. The inferior vena cava is normal in size with greater than 50% respiratory variability, suggesting right atrial pressure of 3 mmHg. FINDINGS  Left Ventricle: Left ventricular ejection fraction, by estimation, is 60 to 65%. The left ventricle has normal function. The left ventricle has no regional wall motion abnormalities. Definity contrast agent was given IV to delineate the left ventricular  endocardial borders. The left ventricular internal cavity size was normal in size. There is no left ventricular hypertrophy. Left ventricular diastolic  parameters are consistent with Grade I diastolic dysfunction (impaired relaxation). Right Ventricle: The right ventricular size is normal. No increase in right ventricular wall thickness. Right ventricular systolic function is normal. Tricuspid regurgitation signal is inadequate for assessing PA pressure. Left Atrium: Left atrial size was normal in size. Right Atrium: Right atrial size was normal in size. Pericardium: There is no evidence of pericardial effusion. Mitral Valve: The mitral valve was not well visualized. No evidence of mitral valve regurgitation. No evidence of mitral valve stenosis. Tricuspid Valve: The tricuspid valve is not well visualized. Tricuspid valve regurgitation is not demonstrated. No evidence of tricuspid stenosis. Aortic Valve: The aortic valve was not well visualized. There is mild calcification of the aortic valve. Aortic valve regurgitation is not visualized. Aortic valve sclerosis is present, with no evidence of aortic valve stenosis. Aortic valve mean gradient measures 9.0 mmHg. Aortic valve peak gradient measures 19.2 mmHg. Aortic valve area, by VTI measures 2.56 cm. Pulmonic Valve: The pulmonic valve was normal in structure. Pulmonic valve regurgitation is not visualized. No evidence of pulmonic stenosis. Aorta: The aortic root is normal in size and structure. Venous: The inferior vena cava is normal in size with greater than 50% respiratory variability, suggesting right atrial pressure of 3 mmHg. IAS/Shunts: No atrial level shunt detected by color flow Doppler.  LEFT VENTRICLE PLAX 2D LVIDd:         4.90 cm LVIDs:         3.50 cm LV PW:         1.00 cm LV IVS:        0.90 cm LVOT diam:     2.30 cm LV SV:         91 LV SV Index:   44 LVOT Area:     4.15 cm  LV Volumes (MOD) LV vol d, MOD A2C: 95.7 ml LV vol d, MOD A4C: 108.0 ml LV vol s, MOD A2C: 46.4 ml LV vol s, MOD A4C: 38.6 ml LV SV MOD A2C:     49.3 ml LV SV MOD A4C:     108.0 ml LV SV MOD BP:      67.9 ml RIGHT VENTRICLE              IVC RV S prime:     14.00 cm/s  IVC diam: 1.90 cm TAPSE (M-mode): 3.2 cm LEFT ATRIUM  Index        RIGHT ATRIUM           Index LA diam:        3.50 cm 1.69 cm/m   RA Area:     10.90 cm LA Vol (A2C):   41.3 ml 19.97 ml/m  RA Volume:   19.40 ml  9.38 ml/m LA Vol (A4C):   62.6 ml 30.28 ml/m LA Biplane Vol: 51.3 ml 24.81 ml/m  AORTIC VALVE AV Area (Vmax):    2.16 cm AV Area (Vmean):   2.48 cm AV Area (VTI):     2.56 cm AV Vmax:           219.00 cm/s AV Vmean:          139.000 cm/s AV VTI:            0.355 m AV Peak Grad:      19.2 mmHg AV Mean Grad:      9.0 mmHg LVOT Vmax:         114.00 cm/s LVOT Vmean:        83.100 cm/s LVOT VTI:          0.219 m LVOT/AV VTI ratio: 0.62  AORTA Ao Root diam: 3.10 cm MITRAL VALVE MV Area (PHT): 3.08 cm    SHUNTS MV Decel Time: 246 msec    Systemic VTI:  0.22 m MV E velocity: 82.70 cm/s  Systemic Diam: 2.30 cm MV A velocity: 90.30 cm/s MV E/A ratio:  0.92 Donato Schultz MD Electronically signed by Donato Schultz MD Signature Date/Time: 08/06/2023/1:02:26 PM    Final     Vitals:   08/05/23 2309 08/06/23 0316 08/06/23 0723 08/06/23 1231  BP: (!) 120/55 (!) 116/56 110/67   Pulse: 74 75 68   Resp: (!) 21 20 (!) 21 20  Temp: 97.8 F (36.6 C) 97.6 F (36.4 C) 98.1 F (36.7 C) 97.7 F (36.5 C)  TempSrc: Oral Oral Oral Oral  SpO2: 92% 92% 95%   Weight:      Height:         PHYSICAL EXAM General:  Alert, well-nourished, well-developed patient in no acute distress Psych:  Mood and affect appropriate for situation CV: Regular rate and rhythm on monitor Respiratory:  Regular, unlabored respirations on room air GI: Abdomen soft and nontender   NEURO:  Mental Status: AA&O  oriented to hospital, self  .  Unable to state situation    He is also hard of hearing  Speech/Language: speech is without dysarthria or aphasia.  Naming, repetition, fluency, and comprehension intact.  Cranial Nerves:  II: PERRL. Visual fields full.  III, IV, VI: EOMI.  Eyelids elevate symmetrically.  V: Sensation is intact to light touch and symmetrical to face.  VII: Face is symmetrical resting and smiling VIII: hearing intact to voice. IX, X: Palate elevates symmetrically. Phonation is normal.  WG:[id number] shrug 5/5. XII: tongue is midline without fasciculations. Motor: 5/5 strength to all muscle groups tested.  Tone: is normal and bulk is normal Sensation- Intact to light touch bilaterally. Extinction absent to light touch to DSS.   Coordination: FTN intact bilaterally, HKS: no ataxia in BLE.No drift.  Gait- deferred  Most Recent NIH 2   ASSESSMENT/PLAN  Kenneth [name] is a 73 y.o. male with history of prior history of cryptogenic strokes, diabetes, hyperlipidemia, hypertension, iron deficiency anemia, OSA, prostate cancer admitted for gradually progressive weakness and poor nutrition intake.    Acute Ischemic Infarct:  bilateral frontal  and parietal lobes, left thalamus, right occipital lobe and cerebellum Etiology: Cryptogenic CT head No acute abnormality.  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 Cord T2 hyperintensity superior to this level, suspicious for edema and compressive myelopathy. Severe left and moderate right foraminal stenosis at this level. MRI T-spine Edema involving the left T1 inferior articular facet,  2D Echo EF 60-65%.  LA size normal. CT chest abdomen pelvis to rule out malignancy pending Loop recorder interrogation negative for paroxysmal A-fib  LDL 47 HgbA1c 5.3 EEG diffuse slowing.  No seizure activity VTE prophylaxis -SCDs aspirin 81 mg daily and Brilinta (ticagrelor) 90 mg bid prior to admission, now on aspirin 81 mg daily and Brilinta (ticagrelor) 90 mg bid  Therapy recommendations:  Pending Disposition: Pending  Hx of Stroke/TIA Bilateral ACA strokes in 2024 November 2024  left frontal and parietal infarcts  Hypertension Home meds: Lotensin 40 mg Hygroton 25 mg Stable Blood Pressure Goal: BP less than 220/110   Hyperlipidemia Home meds: Atorvastatin 40 mg and Zetia 10 mg,  resumed in hospital LDL 47, goal < 70 Continue statin at discharge  Diabetes type II Controlled Home meds: Metformin, Toujeo HgbA1c 5.3, goal < 7.0 CBGs SSI Recommend close follow-up with PCP for better DM control  Dysphagia Patient has post-stroke dysphagia, SLP consulted    Diet   Diet regular Room service appropriate? Yes with Assist; Fluid consistency: Thin   Advance diet as tolerated  Other Stroke Risk Factors Obstructive sleep apnea, not on CPAP at home    Other Active Problems BPH Depression B12 deficiency Deficiency Cognitive impairment   Hospital day # 5    Patient presented with 2-day history of increasing confusion and MRI scan shows by cerebral embolic infarcts of cryptogenic etiology.  Loop recorder interrogation is negative for paroxysmal A-fib.  CT scan chest abdomen pelvis to look for occult malignancy is pending.  Continue aspirin alone for stroke prevention for now.  Patient will likely undergo C-spine surgery for the next few days.  Reintroduce present after surgery when safe.  No family available at the bedside today for discussion.  Stroke team will sign off.  Kindly call for questions.  Discussed with Dr. Thedore Mins.  Greater than 50% time during this 35-minute visit was spent in counseling coordination of care and discussion patient and care team and answering questions.  Delia Heady, MD Medical Director [facility] Pager: [phone] 08/06/2023 2:20 PM   To contact Stroke Continuity provider, please refer to [email or web address]. After hours, contact General Neurology

## 2023-08-06 NOTE — Progress Notes (Signed)
 Patient ID: Kenneth [name], male   DOB: [birth year], 73 y.o.   MRN: [id number] BP (!) 113/94   Pulse 82   Temp 99.1 F (37.3 C) (Axillary)   Resp (!) 29   Ht 5\' 11"  (1.803 m)   Wt 86.6 kg   SpO2 96%   BMI 26.64 kg/m  Lethargic, but arouses to voice.  Moving all extremities No collar is needed OR for next week

## 2023-08-07 ENCOUNTER — Encounter (HOSPITAL_COMMUNITY): Payer: Self-pay | Admitting: Internal Medicine

## 2023-08-07 ENCOUNTER — Inpatient Hospital Stay (HOSPITAL_COMMUNITY)

## 2023-08-07 DIAGNOSIS — D649 Anemia, unspecified: Secondary | ICD-10-CM | POA: Diagnosis not present

## 2023-08-07 DIAGNOSIS — R7401 Elevation of levels of liver transaminase levels: Secondary | ICD-10-CM

## 2023-08-07 DIAGNOSIS — D6859 Other primary thrombophilia: Secondary | ICD-10-CM | POA: Diagnosis not present

## 2023-08-07 DIAGNOSIS — D6851 Activated protein C resistance: Secondary | ICD-10-CM | POA: Diagnosis not present

## 2023-08-07 DIAGNOSIS — I639 Cerebral infarction, unspecified: Secondary | ICD-10-CM

## 2023-08-07 DIAGNOSIS — D696 Thrombocytopenia, unspecified: Secondary | ICD-10-CM

## 2023-08-07 DIAGNOSIS — E43 Unspecified severe protein-calorie malnutrition: Secondary | ICD-10-CM | POA: Insufficient documentation

## 2023-08-07 DIAGNOSIS — D7389 Other diseases of spleen: Secondary | ICD-10-CM | POA: Diagnosis not present

## 2023-08-07 DIAGNOSIS — E119 Type 2 diabetes mellitus without complications: Secondary | ICD-10-CM

## 2023-08-07 LAB — BLOOD CULTURE ID PANEL (REFLEXED) - BCID2

## 2023-08-07 LAB — COMPREHENSIVE METABOLIC PANEL WITH GFR
ALT: 469 U/L — ABNORMAL HIGH (ref 0–44)
AST: 515 U/L — ABNORMAL HIGH (ref 15–41)
Albumin: 1.7 g/dL — ABNORMAL LOW (ref 3.5–5.0)
Alkaline Phosphatase: 234 U/L — ABNORMAL HIGH (ref 38–126)
Anion gap: 10 (ref 5–15)
BUN: 37 mg/dL — ABNORMAL HIGH (ref 8–23)
CO2: 22 mmol/L (ref 22–32)
Calcium: 7.8 mg/dL — ABNORMAL LOW (ref 8.9–10.3)
Chloride: 103 mmol/L (ref 98–111)
Creatinine, Ser: 1.1 mg/dL (ref 0.61–1.24)
GFR, Estimated: >60 mL/min (ref >60)
Glucose, Bld: 121 mg/dL — ABNORMAL HIGH (ref 70–99)
Potassium: 4.1 mmol/L (ref 3.5–5.1)
Sodium: 135 mmol/L (ref 135–145)
Total Bilirubin: 1.2 mg/dL (ref 0.0–1.2)
Total Protein: 4 g/dL — ABNORMAL LOW (ref 6.5–8.1)

## 2023-08-07 LAB — CBC
HCT: 24.9 % — ABNORMAL LOW (ref 39.0–52.0)
Hemoglobin: 8.5 g/dL — ABNORMAL LOW (ref 13.0–17.0)
MCH: 29 pg (ref 26.0–34.0)
MCHC: 34.1 g/dL (ref 30.0–36.0)
MCV: 85 fL (ref 80.0–100.0)
Platelets: 91 10*3/uL — ABNORMAL LOW (ref 150–400)
RBC: 2.93 MIL/uL — ABNORMAL LOW (ref 4.22–5.81)
RDW: 15.4 % (ref 11.5–15.5)
WBC: 10.8 10*3/uL — ABNORMAL HIGH (ref 4.0–10.5)
nRBC: 0 % (ref 0.0–0.2)

## 2023-08-07 LAB — VITAMIN B1: Vitamin B1 (Thiamine): 194.3 nmol/L (ref 66.5–200.0)

## 2023-08-07 LAB — URINALYSIS, W/ REFLEX TO CULTURE (INFECTION SUSPECTED)
Bilirubin Urine: NEGATIVE
Glucose, UA: NEGATIVE mg/dL
Hgb urine dipstick: NEGATIVE
Ketones, ur: NEGATIVE mg/dL
Leukocytes,Ua: NEGATIVE
Nitrite: NEGATIVE
Protein, ur: 100 mg/dL — AB
Specific Gravity, Urine: 1.033 — ABNORMAL HIGH (ref 1.005–1.030)
pH: 5 (ref 5.0–8.0)

## 2023-08-07 LAB — RESPIRATORY PANEL BY PCR

## 2023-08-07 LAB — LACTIC ACID, PLASMA
Lactic Acid, Venous: 2.2 mmol/L (ref 0.5–1.9)
Lactic Acid, Venous: 2.2 mmol/L (ref 0.5–1.9)
Lactic Acid, Venous: >9 mmol/L (ref 0.5–1.9)
Lactic Acid, Venous: 2.1 mmol/L (ref 0.5–1.9)
Lactic Acid, Venous: 2.3 mmol/L (ref 0.5–1.9)

## 2023-08-07 LAB — PROTIME-INR
INR: 1.4 — ABNORMAL HIGH (ref 0.8–1.2)
INR: 1.3 — ABNORMAL HIGH (ref 0.8–1.2)
Prothrombin Time: 17.1 s — ABNORMAL HIGH (ref 11.4–15.2)
Prothrombin Time: 16.4 s — ABNORMAL HIGH (ref 11.4–15.2)

## 2023-08-07 LAB — BASIC METABOLIC PANEL WITH GFR
Anion gap: 10 (ref 5–15)
BUN: 34 mg/dL — ABNORMAL HIGH (ref 8–23)
CO2: 21 mmol/L — ABNORMAL LOW (ref 22–32)
Calcium: 7.8 mg/dL — ABNORMAL LOW (ref 8.9–10.3)
Chloride: 102 mmol/L (ref 98–111)
Creatinine, Ser: 1.16 mg/dL (ref 0.61–1.24)
GFR, Estimated: >60 mL/min (ref >60)
Glucose, Bld: 151 mg/dL — ABNORMAL HIGH (ref 70–99)
Potassium: 3.3 mmol/L — ABNORMAL LOW (ref 3.5–5.1)
Sodium: 133 mmol/L — ABNORMAL LOW (ref 135–145)

## 2023-08-07 LAB — SARS CORONAVIRUS 2 BY RT PCR: SARS Coronavirus 2 by RT PCR: NEGATIVE

## 2023-08-07 LAB — MRSA NEXT GEN BY PCR, NASAL: MRSA by PCR Next Gen: NOT DETECTED

## 2023-08-07 LAB — GLUCOSE, CAPILLARY
Glucose-Capillary: 129 mg/dL — ABNORMAL HIGH (ref 70–99)
Glucose-Capillary: 118 mg/dL — ABNORMAL HIGH (ref 70–99)
Glucose-Capillary: 97 mg/dL (ref 70–99)
Glucose-Capillary: 83 mg/dL (ref 70–99)

## 2023-08-07 LAB — T4, FREE: Free T4: 1.01 ng/dL (ref 0.61–1.12)

## 2023-08-07 LAB — C-REACTIVE PROTEIN: CRP: 15.6 mg/dL — ABNORMAL HIGH (ref <1.0)

## 2023-08-07 LAB — MAGNESIUM: Magnesium: 1.8 mg/dL (ref 1.7–2.4)

## 2023-08-07 LAB — PROCALCITONIN
Procalcitonin: 1.35 ng/mL
Procalcitonin: 1.27 ng/mL

## 2023-08-07 LAB — BRAIN NATRIURETIC PEPTIDE: B Natriuretic Peptide: 27.5 pg/mL (ref 0.0–100.0)

## 2023-08-07 LAB — TSH: TSH: 1.008 u[IU]/mL (ref 0.350–4.500)

## 2023-08-07 MED ORDER — LACTATED RINGERS IV SOLN: INTRAVENOUS | Status: DC

## 2023-08-07 MED ORDER — LACTATED RINGERS IV BOLUS
500.0000 mL | Freq: Once | INTRAVENOUS | Status: AC
  Administered 2023-08-07: 500 mL via INTRAVENOUS

## 2023-08-07 MED ORDER — EPINEPHRINE 0.3 MG/0.3ML IJ SOAJ
0.3000 mg | Freq: Once | INTRAMUSCULAR | Status: DC | PRN
  Filled 2023-08-07: qty 0.3

## 2023-08-07 MED ORDER — SODIUM CHLORIDE 0.9 % IV SOLN
3.0000 g | Freq: Four times a day (QID) | INTRAVENOUS | Status: DC
  Administered 2023-08-07 – 2023-08-08 (×4): 3 g via INTRAVENOUS
  Filled 2023-08-07 (×4): qty 8

## 2023-08-07 MED ORDER — DIPHENHYDRAMINE HCL 50 MG/ML IJ SOLN
25.0000 mg | Freq: Once | INTRAMUSCULAR | Status: DC | PRN
  Filled 2023-08-07: qty 0.5

## 2023-08-07 MED ORDER — POTASSIUM CHLORIDE 10 MEQ/100ML IV SOLN
10.0000 meq | INTRAVENOUS | Status: AC
  Administered 2023-08-07 (×5): 10 meq via INTRAVENOUS
  Filled 2023-08-07 (×5): qty 100

## 2023-08-07 MED ORDER — POTASSIUM CHLORIDE 20 MEQ PO PACK: 40.0000 meq | PACK | Freq: Once | ORAL | Status: DC

## 2023-08-07 MED ORDER — IOHEXOL 350 MG/ML SOLN
100.0000 mL | Freq: Once | INTRAVENOUS | Status: AC | PRN
  Administered 2023-08-07: 100 mL via INTRAVENOUS

## 2023-08-07 MED ORDER — SODIUM CHLORIDE 0.9 % IV SOLN
100.0000 mg | Freq: Two times a day (BID) | INTRAVENOUS | Status: DC
  Administered 2023-08-07 (×2): 100 mg via INTRAVENOUS
  Filled 2023-08-07 (×2): qty 100

## 2023-08-07 MED ORDER — LACTATED RINGERS IV SOLN: INTRAVENOUS | Status: AC

## 2023-08-07 MED ORDER — SODIUM CHLORIDE 0.9 % IV SOLN
2.0000 g | INTRAVENOUS | Status: DC
  Administered 2023-08-07: 2 g via INTRAVENOUS
  Filled 2023-08-07: qty 20

## 2023-08-07 MED ORDER — LACTATED RINGERS IV BOLUS
1000.0000 mL | Freq: Once | INTRAVENOUS | Status: AC
  Administered 2023-08-07: 1000 mL via INTRAVENOUS

## 2023-08-07 MED ORDER — POTASSIUM CHLORIDE 20 MEQ PO PACK
20.0000 meq | PACK | Freq: Once | ORAL | Status: AC
  Administered 2023-08-07: 20 meq via ORAL
  Filled 2023-08-07: qty 1

## 2023-08-07 NOTE — Progress Notes (Addendum)
 Kenneth [name]   DOB:1951-04-18   NG#:[id number]      ASSESSMENT & PLAN:   Hypercoagulable state Recurrent strokes Progressive weakness - Patient reports for stroke May 2024.  Was on Plavix.  Then he was prescribed aspirin and Brilinta. - Patient had second stroke in November 2024.  Now with third episode of stroke. - No previous unprovoked DVT or PE in the past. - Low protein S levels.  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  Combination of findings would increase the risk of VTE and arterial thrombosis. - MRI brain done 08/04/2023 showed many small acute infarcts, multiple vascular territories, consider embolic etiology. - Isolated thrombophilias (factor V Leiden, prothrombin mutation, protein S deficiency) are primarily associated with venous clots. - Combined thrombophilic states can significantly increase the risk of thrombosis overall. While arterial thrombosis isn't as clearly linked as venous events, the compounded hypercoagulability might increase the chance of arterial events (like strokes), particularly if other risk factors or conditions especially if there are significant other CV risk factors.  - There is no way to determine the final effects of the presence of 3 separate hypercoagulable states together. in this situation if the patient were to have an arterial thrombosis outside of the CNS -- we would strongly push for therapeutic anticoagulation but in this case based on mechanism of CVA would defer that choice to neurology.  - Due to unresponsiveness today, being evaluated by ICU team.  - Hematology/Dr. Candise Che following  Lesion, spleen - CT scan done 08/06/2023 shows enlarging 3.4 cm complex lesion upper pole of spleen, considerations for hematoma, low-grade lymphoma or pseudotumor. - Flow cytometry ordered to evaluate further. 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 -Plan for C-spine surgery 08/10/2023  Anemia, normocytic Thrombocytopenia Multifactorial -- significant B12 deficiency. Aspiration pneumonia, possible GI losses while on DAPT. -Hemoglobin low 8.5 today. - Low platelets 91K - No transfusional intervention required at this time - Continue to monitor CBC with differential  Diabetes Hypertension Hyperlipidemia - Continue to monitor blood pressure - Continue to monitor blood sugar levels  Hard of hearing - Since birth   Code Status Full  Subjective:  Patient seen asleep, wife at bedside.  He was unresponsive to tactile stiumli.  Patient's wife reports that he was giving yes and no answers yesterday, that he was aware of where he was but was not oriented to time.  Today however no verbal responses and has not eaten today.    Objective:  Vitals:   08/07/23 0800 08/07/23 1216  BP:    Pulse:    Resp:    Temp: 100.2 F (37.9 C) 97.9 F (36.6 C)  SpO2:       Intake/Output Summary (Last 24 hours) at 08/07/2023 1508 Last data filed at 08/07/2023 0553 Gross per 24 hour  Intake 120 ml  Output 900 ml  Net -780 ml     REVIEW OF SYSTEMS:  Unable to obtain due to medical status  PHYSICAL EXAMINATION: ECOG PERFORMANCE STATUS: 4 - Bedbound  Vitals:   08/07/23 0800 08/07/23 1216  BP:    Pulse:    Resp:    Temp: 100.2 F (37.9 C) 97.9 F (36.6 C)  SpO2:     Filed Weights   08/01/23 0628  Weight: 191 lb (86.6 kg)    GENERAL: +lethargic SKIN: skin color, texture,  turgor are normal, no rashes or significant lesions EYES: normal, conjunctiva are pink and non-injected, sclera clear OROPHARYNX: no exudate, no erythema and lips, buccal mucosa, and tongue normal  NECK: supple, thyroid normal size, non-tender, without nodularity LYMPH: no palpable lymphadenopathy in the cervical, axillary or inguinal LUNGS: clear to auscultation and percussion with normal  breathing effort HEART: regular rate & rhythm and no murmurs and +bil lower extremity edema ABDOMEN: abdomen soft, non-tender and normal bowel sounds MUSCULOSKELETAL: no cyanosis of digits and no clubbing  PSYCH: +lethargic NEURO: no focal motor/sensory deficits   All questions were answered. The patient knows to call the clinic with any problems, questions or concerns.   The total time spent in the appointment was 55 minutes encounter with patient including review of chart and various tests results, discussions about plan of care and coordination of care plan  Dawson Bills, NP 08/07/2023 3:08 PM    Labs Reviewed:  Lab Results  Component Value Date   WBC 10.8 (H) 08/07/2023   HGB 8.5 (L) 08/07/2023   HCT 24.9 (L) 08/07/2023   MCV 85.0 08/07/2023   PLT 91 (L) 08/07/2023   Recent Labs    04/07/23 0458 04/11/23 1113 08/04/23 0452 08/05/23 0457 08/06/23 0018 08/06/23 0509 08/07/23 0243  NA 138   < > 130* 132* 132* 134* 133*  K 3.7   < > 3.7 3.5 3.6 3.6 3.3*  CL 101   < > 101 101 102 102 102  CO2 28   < > 24 23 23 25  21*  GLUCOSE 108*   < > 159* 136* 157* 140* 151*  BUN 12   < > 20 29* 31* 32* 34*  CREATININE 1.08   < > 0.89 0.88 0.94 0.96 1.16  CALCIUM 9.3   < > 7.7* 8.0* 7.7* 8.1* 7.8*  GFRNONAA >60   < > >60 >60 >60 >60 >60  PROT 5.9*   < > 4.6* 4.7*  --  4.5*  --   ALBUMIN 3.1*   < > 2.2* 2.2*  --  2.1*  --   AST 17   < > 47* 123*  --  206*  --   ALT 16   < > 40 123*  --  186*  --   ALKPHOS 38   < > 60 91  --  142*  --   BILITOT 1.2*   < > 1.3* 1.5*  --  1.5*  --   BILIDIR 0.2  --   --   --   --   --   --   IBILI 1.0*  --   --   --   --   --   --    < > = values in this interval not displayed.    Studies Reviewed:  US THYROID Result Date: 08/07/2023 CLINICAL DATA:  Nodule posterior right on recent CT chest EXAM: THYROID ULTRASOUND TECHNIQUE: Ultrasound examination of the thyroid gland and adjacent soft tissues was performed. COMPARISON:  CT 08/06/2023 FINDINGS:  Parenchymal Echotexture: Mildly heterogenous Isthmus: 0.6 cm thickness Right lobe: 3.6 x 1.2 x 1.7 cm Left lobe: 3.5 x 1.7 x 1.7 cm _________________________________________________________ Estimated total number of nodules >/= 1 cm: 0 Number of spongiform nodules >/=  2 cm not described below (TR1): 0 Number of mixed cystic and solid nodules >/= 1.5 cm not described below (TR2): 0 _________________________________________________________ Technologist describes technically difficult study, limited secondary to patient inability to extend neck. No discrete nodules are seen within  the thyroid gland. No regional cervical adenopathy demonstrated. IMPRESSION: Negative limited study. The posterior right thyroid nodule described on CT was not identified. Electronically Signed   By: Corlis Leak M.D.   On: 08/07/2023 14:04   DG CHEST PORT 1 VIEW Result Date: 08/07/2023 CLINICAL DATA:  Follow-up right basilar opacity EXAM: PORTABLE CHEST 1 VIEW COMPARISON:  08/05/2023 FINDINGS: Cardiac shadow is within normal limits. Persistent small right effusion and basilar atelectasis is noted. Left lung remains clear. No bony abnormality is seen. Postsurgical changes in the cervical spine are noted. IMPRESSION: Stable right basilar opacity and effusion. Electronically Signed   By: Alcide Clever M.D.   On: 08/07/2023 02:25   US Abdomen Limited RUQ (LIVER/GB) Result Date: 08/07/2023 CLINICAL DATA:  Elevated LFTs EXAM: ULTRASOUND ABDOMEN LIMITED RIGHT UPPER QUADRANT COMPARISON:  CT from earlier in the same day. FINDINGS: Gallbladder: No gallstones or wall thickening visualized. No sonographic Murphy sign noted by sonographer. Common bile duct: Diameter: 4.7 mm. Liver: No focal lesion identified. Within normal limits in parenchymal echogenicity. Portal vein is patent on color Doppler imaging with normal direction of blood flow towards the liver. Other: None. IMPRESSION: No acute abnormality noted. Electronically Signed   By: Alcide Clever  M.D.   On: 08/07/2023 00:44   CT CHEST ABDOMEN PELVIS W CONTRAST Result Date: 08/06/2023 CLINICAL DATA:  Abdomen or its really busy EXAM: CT CHEST, ABDOMEN, AND PELVIS WITH CONTRAST TECHNIQUE: Multidetector CT imaging of the chest, abdomen and pelvis was performed following the standard protocol during bolus administration of intravenous contrast. RADIATION DOSE REDUCTION: This exam was performed according to the departmental dose-optimization program which includes automated exposure control, adjustment of the mA and/or kV according to patient size and/or use of iterative reconstruction technique. CONTRAST:  75mL OMNIPAQUE IOHEXOL 350 MG/ML SOLN COMPARISON:  CT abdomen pelvis 04/20/2020 FINDINGS: CT CHEST FINDINGS Cardiovascular: Moderate coronary artery calcification. Global cardiac size within limits. Trace pericardial effusion. Central pulmonary arteries are of caliber. Mild atherosclerotic calcification within thoracic aorta. No aortic aneurysm. Mediastinum/Nodes: No pathologic thoracic adenopathy. Stable 19 mm nodule within the posterior right thyroid lobe, not well characterized on this examination. Esophagus unremarkable. 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 No superimposed focal pulmonary infiltrate. No pneumothorax. Central airways are widely patent. Musculoskeletal: No chest wall mass or suspicious bone lesions identified. Probable bone island within the T9 vertebral body. CT ABDOMEN PELVIS FINDINGS Hepatobiliary: No focal liver abnormality is seen. No gallstones, gallbladder wall thickening, or biliary dilatation. Pancreas: Unremarkable Spleen: Mild splenomegaly with the spleen measuring 13.5 cm in greatest dimension. Enlarging 3.4 cm complex hypoechoic lesion within the upper pole the spleen demonstrating heterogeneous attenuation  with a probable solid component superiorly. Differential considerations include a hypoenhancing hamartoma, low-grade lymphoma, or potentially inflammatory pseudotumor, however, this is not well characterized on this examination. Adrenals/Urinary Tract: Bilateral adrenal nodules have developed since prior examination and are indeterminate, measuring 40-55 Hounsfield units in density. These measure up to 2.5 cm in diameter on left. The kidneys are normal in size and position. 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 Simple cortical cyst noted within the upper pole the right kidney for which no follow-up imaging is recommended. The kidneys are otherwise unremarkable. The bladder is partially decompressed and is unremarkable. Stomach/Bowel: Mild perihepatic ascites. The stomach, small, and  large bowel are unremarkable. Appendix normal. No free intraperitoneal gas. Vascular/Lymphatic: Aortic atherosclerosis. No enlarged abdominal or pelvic lymph nodes. Reproductive: Mild prostatic hypertrophy. Other: Mild subcutaneous body wall edema. Mild retroperitoneal edema. Small bilateral fat containing inguinal hernias. Musculoskeletal: No acute or significant osseous findings. IMPRESSION: 1. No acute intrathoracic or intra-abdominal pathology identified. 2. Moderate coronary artery calcification. 3. 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 4. Enlarging 3.4 cm complex hypoechoic lesion within the upper pole the spleen demonstrating heterogeneous attenuation with a probable solid component superiorly. Differential considerations include a hypoenhancing hamartoma, low-grade lymphoma, or potentially inflammatory pseudotumor, however, this is not well characterized on this  examination. This could be simultaneously assessed with dedicated MRI examination of the adrenal glands. 5. Bilateral adrenal nodules have developed since prior examination and are indeterminate, measuring 40-55 Hounsfield units in density. These measure up to 2.5 cm in diameter on left. Dedicated contrast enhanced MRI examination is recommended for further characterization. 6. Mild splenomegaly. 7. Mild anasarca with trace ascites and body wall retroperitoneal edema. 8. Stable 19 mm nodule within the posterior right thyroid lobe, not well characterized on this examination. Recommend thyroid US (ref: J Am Coll Radiol. 2015 Feb;12(2): 143-50). Aortic Atherosclerosis (ICD10-I70.0). Electronically Signed   By: Helyn Numbers M.D.   On: 08/06/2023 15:14   ECHOCARDIOGRAM COMPLETE Result Date: 08/06/2023    ECHOCARDIOGRAM REPORT   Patient Name:   EFFIE JANOSKI Date of Exam: 08/06/2023 Medical Rec #:  [id number]        Height:       71.0 in Accession #:    [id number]       Weight:       191.0 lb Date of Birth:  15-Oct-1950        BSA:          2.068 m Patient Age:    72 years         BP:           110/87 mmHg Patient Gender: M                HR:           72 bpm. Exam Location:  Inpatient Procedure: 2D Echo, Cardiac Doppler, Color Doppler and Intracardiac            Opacification Agent (Both Spectral and Color Flow Doppler were            utilized during procedure). Indications:    Stroke  History:        Patient has prior history of Echocardiogram examinations, most                 recent 04/07/2023. CHF, Stroke, Signs/Symptoms:Syncope; Risk                 Factors:Dyslipidemia, Diabetes, Hypertension and Sleep Apnea.  Sonographer:    Sheralyn Boatman RDCS Referring Phys: [id number] DENISE A WOLFE  Sonographer Comments: Technically difficult study due to poor echo windows, Technically challenging study due to limited acoustic windows, suboptimal parasternal window, suboptimal apical window and suboptimal subcostal window. Image  acquisition challenging due to respiratory motion. Suboptimal windows despite attempts to turn patient. IMPRESSIONS  1. Left ventricular ejection fraction, by estimation, is 60 to 65%. The left ventricle has normal function. The left ventricle has no regional wall motion abnormalities. Left ventricular diastolic parameters are consistent with Grade I diastolic  dysfunction (impaired relaxation).  2. Right ventricular systolic function is normal. The right ventricular size is normal. Tricuspid regurgitation signal is inadequate for assessing PA pressure.  3. The mitral valve was not well visualized. No evidence of mitral valve regurgitation. No evidence of mitral stenosis.  4. The aortic valve was not well visualized. There is mild calcification of the aortic valve. Aortic valve regurgitation is not visualized. Aortic valve sclerosis is present, with no evidence of aortic valve stenosis. Aortic valve Vmax measures 2.19 m/s.  5. The inferior vena cava is normal in size with greater than 50% respiratory variability, suggesting right atrial pressure of 3 mmHg. FINDINGS  Left Ventricle: Left ventricular ejection fraction, by estimation, is 60 to 65%. The left ventricle has normal function. The left ventricle has no regional wall motion abnormalities. Definity contrast agent was given IV to delineate the left ventricular  endocardial borders. The left ventricular internal cavity size was normal in size. There is no left ventricular hypertrophy. Left ventricular diastolic parameters are consistent with Grade I diastolic dysfunction (impaired relaxation). Right Ventricle: The right ventricular size is normal. No increase in right ventricular wall thickness. Right ventricular systolic function is normal. Tricuspid regurgitation signal is inadequate for assessing PA pressure. Left Atrium: Left atrial size was normal in size. Right Atrium: Right atrial size was normal in size. Pericardium: There is no evidence of pericardial  effusion. Mitral Valve: The mitral valve was not well visualized. No evidence of mitral valve regurgitation. No evidence of mitral valve stenosis. Tricuspid Valve: The tricuspid valve is not well visualized. Tricuspid valve regurgitation is not demonstrated. No evidence of tricuspid stenosis. Aortic Valve: The aortic valve was not well visualized. There is mild calcification of the aortic valve. Aortic valve regurgitation is not visualized. Aortic valve sclerosis is present, with no evidence of aortic valve stenosis. Aortic valve mean gradient measures 9.0 mmHg. Aortic valve peak gradient measures 19.2 mmHg. Aortic valve area, by VTI measures 2.56 cm. Pulmonic Valve: The pulmonic valve was normal in structure. Pulmonic valve regurgitation is not visualized. No evidence of pulmonic stenosis. Aorta: The aortic root is normal in size and structure. Venous: The inferior vena cava is normal in size with greater than 50% respiratory variability, suggesting right atrial pressure of 3 mmHg. IAS/Shunts: No atrial level shunt detected by color flow Doppler.  LEFT VENTRICLE PLAX 2D LVIDd:         4.90 cm LVIDs:         3.50 cm LV PW:         1.00 cm LV IVS:        0.90 cm LVOT diam:     2.30 cm LV SV:         91 LV SV Index:   44 LVOT Area:     4.15 cm  LV Volumes (MOD) LV vol d, MOD A2C: 95.7 ml LV vol d, MOD A4C: 108.0 ml LV vol s, MOD A2C: 46.4 ml LV vol s, MOD A4C: 38.6 ml LV SV MOD A2C:     49.3 ml LV SV MOD A4C:     108.0 ml LV SV MOD BP:      67.9 ml RIGHT VENTRICLE             IVC RV S prime:     14.00 cm/s  IVC diam: 1.90 cm TAPSE (M-mode): 3.2 cm LEFT ATRIUM             Index  RIGHT ATRIUM           Index LA diam:        3.50 cm 1.69 cm/m   RA Area:     10.90 cm LA Vol (A2C):   41.3 ml 19.97 ml/m  RA Volume:   19.40 ml  9.38 ml/m LA Vol (A4C):   62.6 ml 30.28 ml/m LA Biplane Vol: 51.3 ml 24.81 ml/m  AORTIC VALVE AV Area (Vmax):    2.16 cm AV Area (Vmean):   2.48 cm AV Area (VTI):     2.56 cm AV Vmax:            219.00 cm/s AV Vmean:          139.000 cm/s AV VTI:            0.355 m AV Peak Grad:      19.2 mmHg AV Mean Grad:      9.0 mmHg LVOT Vmax:         114.00 cm/s LVOT Vmean:        83.100 cm/s LVOT VTI:          0.219 m LVOT/AV VTI ratio: 0.62  AORTA Ao Root diam: 3.10 cm MITRAL VALVE MV Area (PHT): 3.08 cm    SHUNTS MV Decel Time: 246 msec    Systemic VTI:  0.22 m MV E velocity: 82.70 cm/s  Systemic Diam: 2.30 cm MV A velocity: 90.30 cm/s MV E/A ratio:  0.92 Donato Schultz MD Electronically signed by Donato Schultz MD Signature Date/Time: 08/06/2023/1:02:26 PM    Final    DG Chest Port 1 View Result Date: 08/05/2023 CLINICAL DATA:  Shortness of breath. EXAM: PORTABLE CHEST 1 VIEW COMPARISON:  August 04, 2023. FINDINGS: Stable cardiomediastinal silhouette. Left lung is clear. Stable right basilar opacities are noted concerning for subsegmental atelectasis or possibly pneumonia. Bony thorax is unremarkable. IMPRESSION: Stable right basilar opacities as noted above. Electronically Signed   By: Lupita Raider M.D.   On: 08/05/2023 11:23   MR CERVICAL SPINE W WO CONTRAST Result Date: 08/04/2023 CLINICAL DATA:  hyperreflexia and worsening neurological status in setting of prior C-spine surgery EXAM: MRI CERVICAL SPINE WITHOUT AND WITH CONTRAST TECHNIQUE: Multiplanar and multiecho pulse sequences of the cervical spine, to include the craniocervical junction and cervicothoracic junction, were obtained without and with intravenous contrast. CONTRAST:  8.59mL GADAVIST GADOBUTROL 1 MMOL/ML IV SOLN COMPARISON:  None Available. FINDINGS: Moderately motion limited study. Alignment: No substantial sagittal subluxation. Vertebrae: Motion limited evaluation. Edema involving the left T1 inferior articular facet. C5-C7 ACDF. Cord: Cord T2/hyperintensity from C2 to C3-C4. Posterior Fossa, vertebral arteries, paraspinal tissues: Visualized vertebral artery flow voids are maintained. No significant paraspinal edema. Disc levels:  C2-C3: No significant disc protrusion, foraminal stenosis, or canal stenosis. C3-C4: Posterior disc osteophyte complex with ligamentum flavum thickening and bilateral facet and uncovertebral hypertrophy. Resulting severe canal and left foraminal stenosis. Moderate right foraminal stenosis. C4-C5: Small posterior disc osteophyte complex and left greater than right facet and uncovertebral hypertrophy. Mild left foraminal stenosis. Patent canal. C5-C6: ACDF.  Patent canal and foramina. C6-C7: ACDF.  Patent canal and foramina. C7-T1: Small posterior disc osteophyte complex and bilateral facet and uncovertebral hypertrophy. Patent canal and foramina. IMPRESSION: 1. 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 Cord T2 hyperintensity superior to this level, suspicious for edema and compressive myelopathy. Severe left and moderate right foraminal stenosis at this level. 2. Edema involving the left T1 inferior articular facet, which is nonspecific but most likely related to  stress. A CT could better assess bone detail and exclude fracture if clinically warranted. Electronically Signed   By: Feliberto Harts M.D.   On: 08/04/2023 20:53   MR THORACIC SPINE WO CONTRAST Result Date: 08/04/2023 CLINICAL DATA:  Mid-back pain, neuro deficit EXAM: MRI THORACIC SPINE WITHOUT CONTRAST TECHNIQUE: Multiplanar, multisequence MR imaging of the thoracic spine was performed. No intravenous contrast was administered. COMPARISON:  CT of the chest February 27, 2017. FINDINGS: Alignment:  No substantial sagittal subluxation. Vertebrae: Sclerotic lesion at T9 was present in 2018 and therefore likely is benign. No suspicious bone lesions. No bone marrow edema to suggest acute fracture. Vertebral body heights are maintained. Cord:  Normal cord signal. Paraspinal and other soft tissues: Motion limited evaluation without definite abnormality. Disc levels: No significant canal or foraminal stenosis. IMPRESSION: No evidence of acute abnormality or significant  stenosis. Electronically Signed   By: Feliberto Harts M.D.   On: 08/04/2023 19:54   MR BRAIN WO CONTRAST Result Date: 08/04/2023 CLINICAL DATA:  Stroke, follow up EXAM: MRI HEAD WITHOUT CONTRAST TECHNIQUE: Multiplanar, multiecho pulse sequences of the brain and surrounding structures were obtained without intravenous contrast. COMPARISON:  CT head earlier today. 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 Associated Dema without substantial mass effect. No midline shift. No evidence of acute hemorrhage, mass lesion, or hydrocephalus. Vascular: Major arterial flow voids are maintained at the skull base. Skull and upper cervical spine: Normal marrow signal. Sinuses/Orbits: Negative. IMPRESSION: Many small acute infarcts in the bilateral frontal and parietal lobes, left thalamus, right occipital lobe, and cerebellum. Given involvement of multiple vascular territories, consider an embolic etiology. Electronically Signed   By: Feliberto Harts M.D.   On: 08/04/2023 19:25   DG Chest Port 1 View Result Date: 08/04/2023 CLINICAL DATA:  [id number] SOB (shortness of breath) [id number] EXAM: PORTABLE CHEST 1 VIEW COMPARISON:  08/01/2023 FINDINGS: Left chest wall loop recorder. Stable heart size. Low lung volumes. Chronic pleural thickening at the right costophrenic angle. Increased streaky bibasilar opacities. No definite pleural effusion. No pneumothorax. IMPRESSION: Low lung volumes with increased streaky bibasilar opacities, which may represent atelectasis versus pneumonia. Electronically Signed   By: Duanne Guess D.O.   On: 08/04/2023 12:00   EEG adult Result Date: 08/04/2023 Charlsie Quest, MD     08/04/2023 12:10 PM Patient Name: CHORD TAKAHASHI MRN: [id number] Epilepsy Attending: Charlsie Quest Referring Physician/Provider: Leroy Sea, MD Date: 08/04/2023 Duration: 25.23 mins Patient history: 73 year old  male with altered mental status.  EEG to alert for seizure. Level of alertness: Awake AEDs during EEG study: None Technical aspects: This EEG study was done with scalp electrodes positioned according to the 10-20 International system of electrode placement. Electrical activity was reviewed with band pass filter of 1-70Hz , sensitivity of 7 uV/mm, display speed of 53mm/sec with a 60Hz  notched filter applied as appropriate. EEG data were recorded continuously and digitally stored.  Video monitoring was available and reviewed as appropriate. Description: The posterior dominant rhythm consists of 7 Hz activity of moderate voltage (25-35 uV) seen predominantly in posterior head regions, symmetric and reactive to eye opening and eye closing. EEG showed continuous generalized 6 to 7 Hz theta slowing. Hyperventilation and photic stimulation were not performed.   ABNORMALITY - Continuous slow, generalized IMPRESSION: This study is suggestive of mild diffuse encephalopathy. No seizures or epileptiform discharges were seen throughout the recording. Priyanka Annabelle Harman   CT HEAD  WO CONTRAST ( ) Result Date: 08/04/2023 CLINICAL DATA:  Provided history: Memory loss. EXAM: CT HEAD WITHOUT CONTRAST TECHNIQUE: Contiguous axial images were obtained from the base of the skull through the vertex without intravenous contrast. RADIATION DOSE REDUCTION: This exam was performed according to the departmental dose-optimization program which includes automated exposure control, adjustment of the mA and/or kV according to patient size and/or use of iterative reconstruction technique. COMPARISON:  Non-contrast head CT and CT angiogram head 04/06/2023. Brain MRI 04/06/2023. FINDINGS: Brain: Moderate generalized cerebral atrophy. 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 Small chronic  cortical/subcortical infarct within the mid right frontal lobe, new from the prior head CT of 04/06/2023. There is no acute intracranial hemorrhage. No acute demarcated cortical infarct. No extra-axial fluid collection. No evidence of an intracranial mass. No midline shift. Vascular: No hyperdense vessel. Atherosclerotic calcifications. Skull: No calvarial fracture or aggressive osseous lesion. Sinuses/Orbits: No mass or acute finding within the imaged orbits. Minimal mucosal thickening within the bilateral maxillary and right sphenoid sinuses. Mild mucosal thickening within the bilateral ethmoid sinuses. Minimal mucosal thickening within the left frontal sinus. IMPRESSION: 1. No evidence of an acute intracranial abnormality. 2. 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 3. 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 4. Moderate generalized cerebral atrophy. 5. Paranasal sinus disease as described. Electronically Signed   By: Jackey Loge D.O.   On: 08/04/2023 09:40   DG Chest Port 1 View Result Date: 08/01/2023 CLINICAL DATA:  Generalized weakness, onset 8 p.m. last night. EXAM: PORTABLE CHEST 1 VIEW COMPARISON:  AP Lat chest 04/11/2023 FINDINGS: There is chronic thickening of lower lateral right pleura, associated chronic pleuroparenchymal disease in the lateral right base. The lungs are otherwise clear. A loop recorder device is again noted on the left. The cardiac size is normal. There is mild aortic tortuosity and atherosclerosis with a stable mediastinum and no vascular congestion. No new osseous findings. Dorsal and ventral cervical fusion hardware is partially visible. Multilevel thoracic spine bridging enthesopathy. IMPRESSION: 1. No evidence of acute chest disease. 2. 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 3. Aortic atherosclerosis.  Electronically Signed   By: Almira Bar M.D.   On: 08/01/2023 07:44   CUP PACEART REMOTE DEVICE CHECK Result Date: 07/15/2023 ILR summary report received. Battery status OK. Normal device function. No new symptom, tachy, brady, or pause episodes. No new AF episodes. Monthly summary reports and ROV/PRN LA, CVRS  ADDENDUM  .Patient was Personally and independently examined, could not be interviewed due to AMS and relevant elements of the history of present illness were reviewed in details and an assessment and plan was created. All elements of the patient's history of present illness , assessment and plan were discussed in details with Hinton Dyer NP. The above documentation reflects our combined findings assessment and plan.   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 No documented previous h/o VTE.       Has had recurrent CVA's while on DAPT both ASA+ Plavix and then ASA+ Brillinta. TCD-- did show a very small PFO thought to be clinically insignificant.        MRI brain done  08/04/2023 showed many small acute infarcts, multiple vascular territories, consider embolic etiology. - Isolated thrombophilias (factor V Leiden, prothrombin mutation, protein S deficiency) are primarily associated with venous clots. - Combined thrombophilic states can significantly increase the risk of thrombosis overall. While arterial thrombosis isn't as clearly linked as venous events, the compounded hypercoagulability might increase the chance of arterial events (like strokes), particularly if other risk factors or conditions especially if there are significant other CV risk factors.  - There is no way to determine the final effects of the presence of 3 separate hypercoagulable states together. in this situation if the patient were to have an arterial thrombosis outside of the CNS -- we would strongly push for therapeutic  anticoagulation but in this case based on mechanism of CVA would defer that choice to neurology.   Patient currently of Brillinta due to consideration of sx of severe Cervical spinal stenosis with myelopathy. On SQ Heparin for VTE prophylaxis currently.  PLAN -CVA prophylaxis/treatment recommendations per neurology. -VTE prophylaxis currently with SQ heparin per hospital medicine -which is reasonable. -Would need to hold SQ heparin 12 hours prior to C spine surgery. -SCD peri-operatively. -restart prophylactic UFH for anticoagulation As soon as possible after surgery.  -if GIB is an ongoing concern and anticoag for VTE prophylaxis peri-operatively cannot be done then there would need to be consideration for IVC filter. -megace can increase the risk of VTE especially in this setting by its progestin like effects and would recommend to hold this. -flow cytometry to evaluate splenic lesion -consider MRI abd to evaluate splenic lesion and adrenal nodules when patient stable. -appreciate PCCM input and hospital medicine , neurology and neurosurgery cares.  Wyvonnia Lora MD MS

## 2023-08-07 NOTE — Progress Notes (Signed)
 Patient seen and examined, note reviewed with the signed Advanced Practice Provider. I personally reviewed laboratory data, imaging studies and relevant notes. I independently examined the patient and formulated the important aspects of the plan. Comments or changes to the note/plan are indicated below.   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 Hospital course also c/b slow blood loss anemia with EGD 3/23 neg for acute bleed.  PCCM consulted for early sepsis with lactic acidosis secondary to suspected aspiration pneumonia. Fever 3/27. WBC 10.8. LA 9. Repeat lactic acid 2.1.  Blood pressure (!) 109/49, pulse 80, temperature 97.9 F (36.6 C), temperature source Axillary, resp. rate (!) 28, height 5\' 11"  (1.803 m), weight 86.6 kg, SpO2 94%.  Physical Exam: General: Drowsy but awakens HENT: [place], AT, OP clear, MMM Eyes: EOMI, no scleral icterus Respiratory: Clear to auscultation bilaterally.  No crackles, wheezing or rales Cardiovascular: RRR, -M/R/G, no JVD GI: BS+, soft, RUQ tenderness Extremities:-Edema,-tenderness Neuro: Drowsy but awakens, nonfocal exam, CNII-XII grossly intact  Imaging, labs and test noted above have been reviewed independently by me.  Sepsis secondary to aspiration pneumonia - NPO. S/p 500 cc bolus, followed by mIVF. Continue Unasyn and doxy. Trend LA. If unable to protect airway or worsening mental status, low threshold to transfer to ICU. CAUTION with intubation in setting of cervical stenosis.  Elevated LFTs - Obtain CTA A/P. Trend  Severe cervical stenosis - NSGY following and planning for C-spine surgery next week. Brillinta held 3/27  Recurrent strokes since 09/2022, 03/2023, 07/2023, hypercoagulable state. Failed plavix and brilinta. Compound  heterozygous state for factor V Leiden mutation and prothrombin gene mutation. Hematology following  GOC - Confirmed code status with wife. Remain full code  The patient is critically ill with multiple organ systems failure and requires high complexity decision making for assessment and support, frequent evaluation and titration of therapies, application of advanced monitoring technologies and extensive interpretation of multiple databases.  Independent Critical Care Time: 36 Minutes.   [name], M.D. [facility] Pulmonary/Critical Care Medicine 08/07/2023 6:30 PM   Please see Amion for pager number to reach on-call Pulmonary and Critical Care Team.

## 2023-08-07 NOTE — Consult Note (Signed)
 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   On ED arrival  pt was afebrile, HR:63, BP: 104/47, RR 23. Labs were notable for WBC 7.9, Hgb: 9.0( baseline around 12), Platelets 87, Na: 132, K: 4.1, CO2: 21, Cr, 0.87( Baseline), Transaminases/ ALK Phos WNL, T.bili elevated at 1.9, Mg: 1.4. Troponin : 5--> 4, BNP: 33. FOBT negative, Iron studies consistent with deficiency, B12 low. TSH/T4 WNL. UA grossly normal. RVP negative. CXR no acute, chronic right base changes.  Admitted to [facility]. GI was consulted for anemia in the setting of DAPT with recommendation for BID PPI and EGD to r/o occult GIB. EGD completed 3/23 with mild gastritis, no evidence of active bleeding. Neurology consulted(3/25) for new onset confusion. CT head completed 3/25 with NAICA, new small chronic cortical/ subcortical infarct R frontal lobe ( new from prior), known small chronic infarcts, L frontal/ L parietal/ R temporal lobes. EEG (3/25) showed mild diffuse encephalopathy, no seizures. F/u MRI brain (3/25) showed many small acute infarcts concerning for embolic etiology. MRI C-Spine with severe canal stenosis concerning for edema and compressive myelopathy, severe left/moderate right foraminal stenosis, edema of left T1 inferior articular facet. MRI T-spine negative. Given patients progressive weakness in the setting of known C-Spine compression, NSG consulted for  evaluation 3/26 with recommendation for eventual cord decompression once optimized from a medical standpoint.Additionally, psychiatry was consulted for recommendations on medication management for depressed mood. Cardiology was consulted 3/27 for preoperative cardiac risk stratification.  On 3/28AM, patient was reportedly hot to touch per RN with increased RR and WOB and Tmax 103F. CXR obtained 3/28 stable . WBC slightly elevated to 10.8 , INR 1.4, K 3.3, CO2 21. PCT 1.35 ( 0.65), LA 2.2 with repeat >9.   PCCM consulted for concern for developing sepsis, acute liver injury.  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 CT Head/MRI Brain obtained. MRI C-spine and T-spine completed. 3/26 - NSGY evaluation for ?decompression surgery.  3/27 - Cards consult for preoperative risk stratification for possible cord decompression  surgery, tentatively 3/31. 3/28 - Early AM, febrile to Tmax 103F with increased RR/WOB and ?aspiration; LA 2.2 with mild WBC elevation to 10.8; CXR stable. LA > 9 prompted PCCM consult (aberrant value, repeat 2.1). LFTs elevated. Worsening mental status/drowsiness.  Interim  History / Subjective:  PCCM consulted for developing sepsis, elevated LA, concern for development of liver injury Wife at bedside provides most of history, patient is HOH/somnolent Febrile throughout the day, LA of > 9 was a false value (repeat 2.1) Exquisitely TTP through belly with elevated LFTs (AST/ALT/Alk Phos, INR) Plan for further workup as below Low threshold to transfer to ICU for further care  Objective:  Blood pressure (!) 109/49, pulse 80, temperature 97.9 F (36.6 C), temperature source Axillary, resp. rate (!) 28, height 5\' 11"  (1.803 m), weight 86.6 kg, SpO2 94%.        Intake/Output Summary (Last 24 hours) at 08/07/2023 1903 Last data filed at 08/07/2023 0553 Gross per 24 hour  Intake 120 ml  Output 600 ml  Net -480 ml   Filed Weights   08/01/23 0628  Weight: 86.6 kg   Physical Examination: General: Acutely ill-appearing older man in NAD. Somnolent. HEENT: [place]/AT, anicteric sclera, dry mucous membranes. Neuro: Lethargic. Responds to persistent (loud) verbal stimuli. Not following commands. No spontaneous movement of extremities noted on exam. Generalized weakness.  CV: RRR, no m/g/r. PULM: Breathing even and mildly labored on 2LNC. Lung fields clear in upper fields, diminished at bases R > L. GI: Soft, nondistended, exquisitely TTP throughout but most over RUQ. Hypoactive bowel sounds. Extremities: No significant LE edema noted, trace R ankle edema s/p recent sprain. Skin: Warm/dry, no rashes.  Resolved Hospital Problem List:    Assessment & Plan:  Elevated LFTs, concern for worsening acute liver injury versus failure Differential includes embolic concern (PVT versus Budd-Chiari, given history of clotting disorder), DILI, acute hepatitis. - STAT CTA Abdomen/Pelvis, rule out significant clot burden/acute liver concerns - US Liver Doppler to check velocities/flows - F/u acute hepatitis panel - Trend LFTs, transaminases/Alk Phos uptrending and INR mildly  elevated, LA elevated 3/28 - Low threshold to move to ICU for close monitoring  Concern for developing sepsis ?Aspiration PNA Mild lactic acidosis - Goal MAP > 65 - Fluid resuscitation as tolerated, appears clinically dry on exam - Trend WBC, fever curve, LA - F/u Cx data - Continue broad-spectrum antibiotics (Unasyn/doxycycline, may require broadening for intrabdominal pathology)  Cryptogenic CVAs, 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 MRI Brain (3/25) showed many small acute infarcts concerning for embolic etiology. TCD bubble study 2/15 with "small, clinically insignificant PFO". - On DAPT (ASA/Brilinta) PTA, still with embolic concerns; loop recorder negative for arrhythmia/A-Fib  - Neuro following, appreciate recommendations - Additional brain imaging per Neuro - Frequent neuro checks - Neuroprotective measures: HOB > 30 degrees, normoglycemia, normothermia, electrolytes WNL  Hypercoagulable state due to Factor V Leiden and prothrombin gene mutations Low protein S levels - Followed by Heme/Onc - Consideration for DOAC/ASA instead of ASA/Brilinta  Cervical spinal stenosis with cord compression S/p prior ACDF (2014) MRI C-spine with severe canal stenosis concerning for edema and compressive myelopathy, severe left/moderate right foraminal stenosis, edema of left T1 inferior articular facet. MRI T-spine negative. - NSGY consulted, following - Tentative plan for cord decompression pending medical surgical clearance; on hold at this  time due to clinical decompensation - PT/OT when clinically appropriate  Anemia, multifactorial in the setting of critical illness, B12 deficiency, iron deficiency - Trend H&H, Plt, INR - Monitor for signs of active bleeding - Transfuse for Hgb < 7.0 or  hemodynamically significant bleeding - B12/folate/thiamine supplementation  T2DM - SSI - CBGs Q4H while NPO - Goal CBG 140-180  History of diverticulosis - Noted history, f/u CTA A/P  Very low threshold to move to ICU for closer monitoring if any signs of further clinical decompensation.  Best Practice: (right click and "Reselect all SmartList Selections" daily)   Diet/type: NPO DVT prophylaxis: SCDs, SQH GI prophylaxis: PPI Lines: N/A Foley:  N/A Code Status:  full code Last date of multidisciplinary goals of care discussion [Per Primary Team]  Labs:  CBC: Recent Labs  Lab 08/03/23 0513 08/03/23 1834 08/04/23 0452 08/05/23 0457 08/06/23 0018 08/06/23 0509 08/07/23 0601  WBC 7.5 8.9 8.5 9.0 9.8 9.2 10.8*  NEUTROABS 4.4 5.8 7.2 5.6  --  5.7  --   HGB 8.8* 9.6* 8.7* 9.0* 8.7* 8.9* 8.5*  HCT 24.9* 27.3* 24.7* 26.3* 24.9* 25.6* 24.9*  MCV 85.6 86.7 84.6 86.5 85.0 85.3 85.0  PLT 108* 101* 101* 97* 95* 97* 91*   Basic Metabolic Panel: Recent Labs  Lab 08/02/23 0549 08/03/23 0513 08/04/23 0452 08/05/23 0457 08/06/23 0018 08/06/23 0509 08/07/23 0243 08/07/23 1613  NA 131* 131* 130* 132* 132* 134* 133* 135  K 3.7 3.7 3.7 3.5 3.6 3.6 3.3* 4.1  CL 100 102 101 101 102 102 102 103  CO2 22 24 24 23 23 25  21* 22  GLUCOSE 143* 146* 159* 136* 157* 140* 151* 121*  BUN 12 17 20  29* 31* 32* 34* 37*  CREATININE 0.81 0.87 0.89 0.88 0.94 0.96 1.16 1.10  CALCIUM 8.3* 7.8* 7.7* 8.0* 7.7* 8.1* 7.8* 7.8*  MG 1.7 1.6* 1.9 1.8 1.8 1.8 1.8  --   PHOS 3.6 3.6 3.8 3.9  --   --   --   --    GFR: Estimated Creatinine Clearance: 62.8 mL/min (by C-G formula based on SCr of 1.1 mg/dL). Recent Labs  Lab 08/05/23 0457 08/06/23 0018 08/06/23 0509 08/07/23 0243 08/07/23 0601 08/07/23 0740 08/07/23 1403 08/07/23 1613  PROCALCITON  --   --  0.65 1.35  --   --   --  1.27  WBC 9.0 9.8 9.2  --  10.8*  --   --   --   LATICACIDVEN  --   --   --   --  2.2* 2.2* >9.0* 2.1*   Liver  Function Tests: Recent Labs  Lab 08/03/23 0513 08/04/23 0452 08/05/23 0457 08/06/23 0509 08/07/23 1613  AST 25 47* 123* 206* 515*  ALT 22 40 123* 186* 469*  ALKPHOS 45 60 91 142* 234*  BILITOT 1.3* 1.3* 1.5* 1.5* 1.2  PROT 4.8* 4.6* 4.7* 4.5* 4.0*  ALBUMIN 2.4* 2.2* 2.2* 2.1* 1.7*   Recent Labs  Lab 08/01/23 0630  LIPASE 27   Recent Labs  Lab 08/04/23 0845 08/05/23 0457  AMMONIA 17 16   ABG:    Component Value Date/Time   PHART 7.47 (H) 08/04/2023 0825   PCO2ART 37 08/04/2023 0825   PO2ART 128 (H) 08/04/2023 0825   HCO3 26.8 08/04/2023 0825   TCO2 28 04/06/2023 1750   O2SAT 100 08/04/2023 0825    Coagulation Profile: Recent Labs  Lab 08/07/23 0243 08/07/23 1613  INR 1.4* 1.3*   Cardiac Enzymes: No results for input(s): "  CKTOTAL", "CKMB", "CKMBINDEX", "TROPONINI" in the last 168 hours.  HbA1C: Hgb A1c MFr Bld  Date/Time Value Ref Range Status  08/01/2023 06:30 AM 5.3 4.8 - 5.6 % Final    Comment:    (NOTE) Pre diabetes:          5.7%-6.4%  Diabetes:              >6.4%  Glycemic control for   <7.0% adults with diabetes   04/07/2023 04:58 AM 8.2 (H) 4.8 - 5.6 % Final    Comment:    (NOTE) Pre diabetes:          5.7%-6.4%  Diabetes:              >6.4%  Glycemic control for   <7.0% adults with diabetes    CBG: Recent Labs  Lab 08/06/23 1700 08/06/23 2203 08/07/23 0812 08/07/23 1301 08/07/23 1647  GLUCAP 167* 225* 129* 118* 97   Review of Systems:   Patient is encephalopathic; therefore, history has been obtained from chart review.   Past Medical History:  He,  has a past medical history of Broken neck (HCC), Cervical spinal stenosis, Diabetes mellitus, Diverticulosis, Hearing loss of both ears, History of colon polyps - adenomas (04/03/2010), Hypercoagulable state (HCC), Hyperlipidemia, Hypertension, Iron deficiency anemia, unspecified, OSA (obstructive sleep apnea), Pneumonia (03/2022), Prostate cancer (HCC), Protein S deficiency (HCC),  and Stroke (HCC).   Surgical History:   Past Surgical History:  Procedure Laterality Date   ANTERIOR CERVICAL DECOMP/DISCECTOMY FUSION N/A 10/01/2012   Procedure: ANTERIOR CERVICAL DECOMPRESSION/DISCECTOMY FUSION 2 LEVELS;  Surgeon: Tia Alert, MD;  Location: [facility]URO ORS;  Service: Neurosurgery;  Laterality: N/A;  Cervical five-six,Cervical six-seven   COLONOSCOPY  09/30/2010, 03/01/2014   diverticulosis, small internal hemorrhoids   COLONOSCOPY W/ POLYPECTOMY  04/03/2010   4 polyps 8-77mm, worst TV adenoma with high-grade dysplasia   ESOPHAGOGASTRODUODENOSCOPY N/A 08/02/2023   Procedure: EGD (ESOPHAGOGASTRODUODENOSCOPY);  Surgeon: [name], MD;  Location: [facility] ENDOSCOPY;  Service: Gastroenterology;  Laterality: N/A;   LOOP RECORDER INSERTION N/A 10/10/2022   Procedure: LOOP RECORDER INSERTION;  Surgeon: Marinus Maw, MD;  Location: [facility] INVASIVE CV LAB;  Service: Cardiovascular;  Laterality: N/A;   NO PAST SURGERIES     POLYPECTOMY     POSTERIOR CERVICAL FUSION/FORAMINOTOMY Left 10/20/2012   Procedure: C/5-6,C/6-7 [place]/Multi level,POSTERIOR CERVICAL FUSION/FORAMINOTOMY C/4-7;  Surgeon: Tia Alert, MD;  Location: [facility] NEURO ORS;  Service: Neurosurgery;  Laterality: Left;  Cervical Five-Six, Cervical Six-Seven Laminectomies, Posterior Cervical Fusion/Foraminotomies Cervical Four through Seven.    TONSILLECTOMY     Social History:   reports that he quit smoking about 53 years ago. His smoking use included cigarettes. He started smoking about 55 years ago. He has a 0.5 pack-year smoking history. He has never used smokeless tobacco. He reports that he does not drink alcohol and does not use drugs.   Family History:  His family history includes Diabetes in his mother. There is no history of Stomach cancer, Rectal cancer, Pancreatic cancer, Colon cancer, Colon polyps, Esophageal cancer, Prostate cancer, or Breast cancer.   Allergies: Allergies  Allergen Reactions   Jardiance  [Empagliflozin] Other (See Comments)    To the hospital- due to complications   Fluoxetine Other (See Comments)    Prefers to NOT take this   Penicillins Other (See Comments)    CHILDHOOD ALLERGY- exact reaction? Has patient had a PCN reaction causing immediate rash, facial/tongue/throat swelling, SOB or lightheadedness with hypotension: Unknown Has patient had  a PCN reaction causing severe rash involving mucus membranes or skin necrosis: Unknown Has patient had a PCN reaction that required hospitalization: Unknown Has patient had a PCN reaction occurring within the last 10 years: Unknown If all of the above answers are "NO", then may proceed with Cephalosporin use.     Silodosin Other (See Comments)    Vertigo    Home Medications: Prior to Admission medications   Medication Sig Start Date End Date Taking? Authorizing Provider  aspirin 81 MG chewable tablet Chew 1 tablet (81 mg total) by mouth daily. 04/08/23  Yes Marinda Elk, MD  atorvastatin (LIPITOR) 40 MG tablet Take 1 tablet (40 mg total) by mouth daily. Patient taking differently: Take 40 mg by mouth at bedtime. 10/17/22  Yes Love, Evlyn Kanner, PA-C  benazepril (LOTENSIN) 40 MG tablet Take 40 mg by mouth daily.   Yes [provider]  BRILINTA 90 MG TABS tablet Take 90 mg by mouth 2 (two) times daily. 04/24/23  Yes [provider]  chlorthalidone (HYGROTON) 25 MG tablet Take 25 mg by mouth daily.   Yes [provider]  Cholecalciferol (VITAMIN D3) 1000 units CAPS Take 1,000 Units by mouth in the morning and at bedtime.   Yes [provider]  Continuous Glucose Sensor (FREESTYLE LIBRE 14 DAY SENSOR) MISC Inject 1 Device into the skin every 14 (fourteen) days.   Yes [provider]  escitalopram (LEXAPRO) 20 MG tablet Take 20 mg by mouth daily.   Yes [provider]  ezetimibe (ZETIA) 10 MG tablet Take 10 mg by mouth daily. 01/19/20  Yes [provider]  metFORMIN  (GLUCOPHAGE-XR) 500 MG 24 hr tablet Take 500 mg by mouth in the morning and at bedtime.   Yes [provider]  tamsulosin (FLOMAX) 0.4 MG CAPS capsule Take 0.4 mg by mouth daily. 07/31/23  Yes [provider]  TOUJEO SOLOSTAR 300 UNIT/ML Solostar Pen Inject 40 Units into the skin in the morning. 04/08/23  Yes Marinda Elk, MD  MOUNJARO 5 MG/0.5ML Pen Inject 5 mg into the skin every Monday. Patient not taking: Reported on 08/01/2023    [provider]    Critical care time: N/A   Faythe Ghee  Pulmonary & Critical Care 08/07/23 7:03 PM  Please see [email or web address] for pager details.  From 7A-7P if no response, please call [phone] After hours, please call ELink [phone]

## 2023-08-07 NOTE — Progress Notes (Addendum)
 Loop interrogated, no recent events.  No further cardiac testing recommended.  Cardiology will sign off, please call if any questions arise.  [facility] will sign off.   Medication Recommendations:  No changes Other recommendations (labs, testing, etc):  None Follow up as an outpatient:  Scheduled for 09/01/23  Little Ishikawa, MD

## 2023-08-07 NOTE — Progress Notes (Signed)
 Patient ID: Kenneth [name], male   DOB: 07/21/1950, 73 y.o.   MRN: [id number]  BP 114/64 (BP Location: Right Arm)   Pulse 79   Temp 98.2 F (36.8 C) (Oral)   Resp 16   Ht 5\' 11"  (1.803 m)   Wt 86.6 kg   SpO2 96%   BMI 26.64 kg/m  Events of day noted. There is no set time for surgery. In light of possible sepsis will wait for resolution. If possible will proceed next week. Clearly conditional on his medical status.

## 2023-08-07 NOTE — Progress Notes (Addendum)
 Pt felt hot to touch on rounds. Respirations up to 28 now and temp taken was 103 axillary. Tylenol suppository given for temp. Dr. Janalyn Shy was notified and new orders noted. Pt placed in isolation until resp panel comes back. Will  monitor closely.

## 2023-08-07 NOTE — Progress Notes (Addendum)
 PROGRESS NOTE                                                                                                                                                                                                             Patient Demographics:    Kenneth [name], is a 73 y.o. male, DOB - June 13, 1950, ZOX:[id number]  Outpatient Primary MD for the patient is [name], MD    LOS - 6  Admit date - 08/01/2023    Chief Complaint  Patient presents with   Weakness       Brief Narrative (HPI from H&P)   73 y.o. male, with history of stroke, currently on DAPT, dyslipidemia, BPH, prostate cancer, insulin-dependent DM type II, hypertension, cervical stenosis, history of iron deficiency anemia, history of OSA does not wear CPAP, recent history of right ankle strain 2 weeks ago, presents to the hospital for 2-3 week history of gradually progressive weakness, was also not eating or drinking well for a few days.  For the last couple of days he has been having problems getting out of the bed and ambulating as he is extremely weak, in the ER he was found to be hypotensive, had hypomagnesemia, was also found to have new onset anemia and I was called to admit the patient.  Further workup suggested that patient had ongoing cryptogenic strokes despite being on DAPT, generalized weakness, C-spine compression.  He was seen by neurology, neurosurgery, overall extremely frail, poor cough reflex and swallowing capacity, now developing aspiration pneumonia.   Subjective:   Patient in bed, appears weak n frail, denies any headache, no fever, no chest pain or pressure, no shortness of breath , no abdominal pain. No new focal weakness.   Assessment  & Plan :    1.  Gradually progressive generalized weakness in a patient with gradually progressive anemia of chronic disease, dehydration, hypomagnesemia.  Along with recurrent cryptogenic strokes.   Patient has  had chronic anemia for several years however there is at least a 3 g drop in his hemoglobin in the last few months, of note he has been started on DAPT for his recent stroke around that timeframe, he denies any hematemesis, no dark stools or blood in stool.  He likely has intermittent chronic GI blood loss, Hemoccult negative in the ER, place him on PPI, for now continue  DAPT, GI has been consulted, EGD done on 08/02/2023 did not show any acute bleed, stable peripheral smear, anemia panel noted with possible mild iron deficiency and B12 deficiency both have been replaced, CBC is currently stable, on PPI, plz  see below.     2.   Recent stroke few months ago, 2 episodes of cryptogenic stroke in the last several months, now appears to have a third episode of subacute to acute cryptogenic strokes this admission, history of hypercoagulable state.  On DAPT, statin and Zetia, MRI noted, had 2 different episodes of cryptogenic bilateral strokes 2 months and 8 months ago respectively, MRI now again shows new bilateral cryptogenic strokes spite being on DAPT, seen by stroke team, he also has history of hypercoagulable state for which se has seen hematology in the past.    Will request hematology to see the patient here and opine if he would benefit from anticoagulation in place of DAPT which is clearly not working.  He also has a spot on his spleen will request hematology to review.  His loop recorder evaluation this admission is stable.  He tentatively needs C-spine surgery as well hopefully we can start him on anticoagulation if suggested after the C-spine surgery.  For now continue aspirin, statin and Zetia, Brilinta last dose given 08/05/2023 and now held in lieu of his C-spine surgery later this admission if he remains stable    3.  MRI evidence of C-spine compression.  Currently no focal deficits, seen by Dr. Franky Macho, likely will require surgical decompression likely coming Monday, Brilinta last dose 08/06/2023  morning, it has been discontinued, currently on baby aspirin, he is very frail at baseline he is at the very least moderate risk for adverse cardiopulmonary outcome, will have cardiology also evaluate him prior to surgery.  Seen and cleared by cardiology.  However overall extremely frail and now developing aspiration pneumonia.  For now we will hold any surgical intervention, once he is medically stable will inform neurosurgery of the same.  Likely middle or late next week.   4.   Metabolic encephalopathy, generalized weakness.  Underlying depression.  No new focal deficits, currently see above, had extensive workup and seen by neurology, stable TSH, stable ABG, stable ammonia, stable EEG, MRI showing multiple acute and subacute strokes, kindly see below.  Neurology and psychiatry following.  Psych medications adjusted per psychiatry  5.  Early sepsis with aspiration pneumonia night of 08/06/2023.  Blood cultures obtained, IV fluid bolus and maintenance, IV antibiotics, MRSA nasal PCR, respiratory viral panel, follow cultures, for now we will make him n.p.o. till mentation improves.  Continue to monitor closely.  Speech therapy already on board.   6.  Hypercoagulable state, new density in the spleen.  Hematology consulted as above.     7.  Dyslipidemia.  Continue statin and Zetia.   8.  Essential hypertension.  Currently blood pressure low, dehydrated, hold blood pressure medications hydrate and monitor.     9.  History of cervical stenosis, recent right ankle strain.  Supportive care.     10.  Borderline iron and B12 deficiency.  Replaced.    11.  Hyponatremia.  has been hydrated adequately, stable.  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  Fluid restriction and monitor.  12. Hypomagnesemia and dehydration.  Hydrated with IV fluids, magnesium replaced stable.  13. Oral intake and appetite.  Add Megace.  Treat depression.    14. OSA.  Does not use CPAP.    15.  Asymptomatic transaminitis.  Likely due to hypotension, stable right upper quadrant ultrasound, negative acute hepatitis panel, monitor trend.  16. BPH.  Continue Flomax.  UA stable no UTI.  16. Depression.  Psych consulted, Lexapro was stopped by psych, defer any additional medications or changes in psych medications to the psych team.  17.  Thyroid nodule on CT scan, check TSH, free T4 and thyroid ultrasound.    18. DM type II.  On high doses of insulin at home, continue with q. [facility] S sliding scale.  Every 2 Accu-Cheks while n.p.o. on 08/02/2023.   CBG (last 3)  Recent Labs    08/06/23 1700 08/06/23 2203 08/07/23 0812  GLUCAP 167* 225* 129*    Lab Results  Component Value Date   HGBA1C 5.3 08/01/2023         Condition - Guarded  Family Communication  : Wife on the day of admission 08/01/2023 bedside, wife over the phone 08/02/2023, bedside 08/03/2023, 08/04/2023 x 3, 08/05/2023, called wife multiple times 08/06/2023 between 9 and 10 AM, total 5 different times phone switched off.  Wife updated again evening of 08/06/2023, morning of 08/07/2023  Code Status : Full code  Consults  : GI, Neuro, Cards, N. Surgery, hematology  PUD Prophylaxis : PPI   Procedures  :     Right upper quadrant ultrasound.  Unremarkable.   CT chest abdomen pelvis.  1. No acute intrathoracic or intra-abdominal pathology identified. 2. Moderate coronary artery calcification. 3. 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 4. Enlarging 3.4 cm complex hypoechoic lesion within the upper pole the spleen demonstrating heterogeneous attenuation with a probable solid component superiorly. Differential considerations include a hypoenhancing hamartoma, low-grade lymphoma, or potentially inflammatory pseudotumor, however, this is not well characterized on this examination. This could be simultaneously  assessed with dedicated MRI examination of the adrenal glands. 5. Bilateral adrenal nodules have developed since prior examination and are indeterminate, measuring 40-55 Hounsfield units in density. These measure up to 2.5 cm in diameter on left. Dedicated contrast enhanced MRI examination is recommended for further characterization. 6. Mild splenomegaly. 7. Mild anasarca with trace ascites and body wall retroperitoneal edema. 8. Stable 19 mm nodule within the posterior right thyroid lobe, not well characterized on this examination. Recommend thyroid US  EEG.  Nonacute.    MRI brain.  Many small acute infarcts in the bilateral frontal and parietal lobes, left thalamus, right occipital lobe, and cerebellum. Given involvement of multiple vascular territories, consider an embolic etiology  MRI C-spine.  1. 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 Cord T2 hyperintensity superior to this level, suspicious for edema and compressive myelopathy. Severe left and moderate right foraminal stenosis at this level. 2. Edema involving the left T1 inferior articular facet, which is nonspecific but most likely related to stress. A CT could better assess bone detail and exclude fracture if clinically warranted.  CT head.  1. No evidence of an acute intracranial abnormality. 2. 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 3. 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 4. Moderate generalized cerebral atrophy. 5. Paranasal sinus disease as described.   EGD  Impression:               - The examined portions of the nasopharynx,  oropharynx and larynx were normal.                           - Normal esophagus.                           - 3 cm hiatal hernia with prolapsing cardia.                           - Gastritis. Biopsied.                           - Normal  examined duodenum.                           - No obvious source of GI bleeding. Doubt mild                            gastritis would have been a source of significant                            bleed. Moderate Sedation:      N/A Recommendation:           - Patient has a contact number available for                            emergencies. The signs and symptoms of potential                            delayed complications were discussed with the                            patient. Return to normal activities tomorrow.                            Written discharge instructions were provided to the                            patient.                           - Resume previous diet.                           - Continue present medications including                            Brillinta/ASA.                           - Await pathology results.                           - Consider non-GI sources of anemia, given absence                            of overt bleeding, negative FOBT  and concomitant                            thrombocytopenia.                           - Colonoscopy could be considered for further                            evaluation, but likely low yield, given his recent                            colonoscopy in 2021.                           - GI will sign off for now. If no other possible                            etiologies for anemia identified, or if patient has                            overt GI bleeding, please reconsult GI.      Disposition Plan  :    Status is: Inpatient   DVT Prophylaxis  :    heparin injection 5,000 Units Start: 08/06/23 2200 Place and maintain sequential compression device Start: 08/06/23 1801 SCDs Start: 08/01/23 1239 Place TED hose Start: 08/01/23 1233    Lab Results  Component Value Date   PLT 91 (L) 08/07/2023    Diet :  Diet Order             Diet NPO time specified Except for: Sips with Meds  Diet effective now                     Inpatient Medications  Scheduled Meds:  aspirin  81 mg Oral Daily   atorvastatin  40 mg Oral QHS   vitamin B-12  1,000 mcg Oral Daily   ezetimibe  10 mg Oral Daily   ferrous sulfate  325 mg Oral BID WC   folic acid  1 mg Oral Daily   Gerhardt's butt cream   Topical BID   heparin injection (subcutaneous)  5,000 Units Subcutaneous Q8H   insulin aspart  0-15 Units Subcutaneous TID WC   insulin aspart  0-5 Units Subcutaneous QHS   insulin glargine-yfgn  30 Units Subcutaneous Q1400   megestrol  40 mg Oral Daily   mirtazapine  7.5 mg Oral QHS   pantoprazole  40 mg Oral BID AC   tamsulosin  0.4 mg Oral QPM   thiamine  100 mg Oral q AM   venlafaxine XR  37.5 mg Oral Q breakfast   Continuous Infusions:  ampicillin-sulbactam (UNASYN) IV     doxycycline (VIBRAMYCIN) IV     lactated ringers 100 mL/hr at 08/07/23 0750   potassium chloride 10 mEq (08/07/23 0758)    PRN Meds:.acetaminophen **OR** acetaminophen, albuterol, bisacodyl, EPINEPHrine, menthol-cetylpyridinium, [DISCONTINUED] ondansetron **OR** ondansetron (ZOFRAN) IV, phenol  Antibiotics  :    Anti-infectives (From admission, onward)    Start     Dose/Rate Route Frequency Ordered Stop   08/07/23 0900  Ampicillin-Sulbactam (UNASYN) 3 g in sodium chloride 0.9 % 100 mL IVPB  3 g 200 mL/hr over 30 Minutes Intravenous Every 6 hours 08/07/23 0803     08/07/23 0830  doxycycline (VIBRAMYCIN) 100 mg in sodium chloride 0.9 % 250 mL IVPB        100 mg 125 mL/hr over 120 Minutes Intravenous 2 times daily 08/07/23 0725     08/07/23 0600  cefTRIAXone (ROCEPHIN) 2 g in sodium chloride 0.9 % 100 mL IVPB  Status:  Discontinued        2 g 200 mL/hr over 30 Minutes Intravenous Every 24 hours 08/07/23 0452 08/07/23 0725         Objective:   Vitals:   08/07/23 0300 08/07/23 0400 08/07/23 0500 08/07/23 0600  BP: (!) 96/53 (!) 97/56 (!) 101/50 (!) 109/49  Pulse: 100 93 82 80  Resp: (!) 28 (!) 27 (!) 26 (!) 28   Temp: (!) 102 F (38.9 C) (!) 101.6 F (38.7 C) (!) 101.4 F (38.6 C) (!) 101.2 F (38.4 C)  TempSrc: Axillary Axillary Axillary Axillary  SpO2: 98% 96% 91% 94%  Weight:      Height:        Wt Readings from Last 3 Encounters:  08/01/23 86.6 kg  05/21/23 88.7 kg  05/01/23 89.3 kg     Intake/Output Summary (Last 24 hours) at 08/07/2023 0849 Last data filed at 08/07/2023 0553 Gross per 24 hour  Intake 120 ml  Output 900 ml  Net -780 ml     Physical Exam  pleasantly confused - more drowsy than before, No new F.N deficits, overall quite deconditioned [place].AT,PERRAL Supple Neck, No JVD,   Symmetrical Chest wall movement, Good air movement bilaterally, CTAB RRR,No Gallops,Rubs or new Murmurs,  +ve B.Sounds, Abd Soft, No tenderness,   No Cyanosis, Clubbing or edema        Data Review:    Recent Labs  Lab 08/03/23 0513 08/03/23 1834 08/04/23 0452 08/05/23 0457 08/06/23 0018 08/06/23 0509 08/07/23 0601  WBC 7.5 8.9 8.5 9.0 9.8 9.2 10.8*  HGB 8.8* 9.6* 8.7* 9.0* 8.7* 8.9* 8.5*  HCT 24.9* 27.3* 24.7* 26.3* 24.9* 25.6* 24.9*  PLT 108* 101* 101* 97* 95* 97* 91*  MCV 85.6 86.7 84.6 86.5 85.0 85.3 85.0  MCH 30.2 30.5 29.8 29.6 29.7 29.7 29.0  MCHC 35.3 35.2 35.2 34.2 34.9 34.8 34.1  RDW 14.8 15.2 14.9 15.3 15.2 15.2 15.4  LYMPHSABS 1.0 1.0 0.5* 0.9  --  1.1  --   MONOABS 2.0* 1.9* 0.7 2.4*  --  2.2*  --   EOSABS 0.0 0.0 0.0 0.0  --  0.0  --   BASOSABS 0.0 0.0 0.1 0.0  --  0.1  --     Recent Labs  Lab 08/01/23 0630 08/01/23 0913 08/01/23 1310 08/02/23 0549 08/03/23 0513 08/04/23 0452 08/04/23 0845 08/05/23 0457 08/05/23 0500 08/06/23 0018 08/06/23 0509 08/07/23 0243 08/07/23 0601 08/07/23 0740  NA 132*  --   --  131* 131* 130*  --  132*  --  132* 134* 133*  --   --   K 4.1  --   --  3.7 3.7 3.7  --  3.5  --  3.6 3.6 3.3*  --   --   CL 101  --   --  100 102 101  --  101  --  102 102 102  --   --   CO2 21*  --   --  22 24 24   --  23  --  23 25 21*  --    --  ANIONGAP 10  --   --  9 5 5   --  8  --  7 7 10   --   --   GLUCOSE 168*  --   --  143* 146* 159*  --  136*  --  157* 140* 151*  --   --   BUN 17  --   --  12 17 20   --  29*  --  31* 32* 34*  --   --   CREATININE 0.87  --   --  0.81 0.87 0.89  --  0.88  --  0.94 0.96 1.16  --   --   AST 26  --   --  19 25 47*  --  123*  --   --  206*  --   --   --   ALT 15  --   --  17 22 40  --  123*  --   --  186*  --   --   --   ALKPHOS 43  --   --  43 45 60  --  91  --   --  142*  --   --   --   BILITOT 1.9*  --   --  1.4* 1.3* 1.3*  --  1.5*  --   --  1.5*  --   --   --   ALBUMIN 2.6*  --   --  2.5* 2.4* 2.2*  --  2.2*  --   --  2.1*  --   --   --   CRP  --   --   --   --   --   --   --   --   --   --  12.6* 15.6*  --   --   PROCALCITON  --   --   --   --   --   --   --   --   --   --  0.65 1.35  --   --   LATICACIDVEN  --   --   --   --   --   --   --   --   --   --   --   --  2.2* 2.2*  INR  --   --   --   --   --   --   --   --   --   --   --  1.4*  --   --   TSH  --   --  1.499  --   --   --   --   --   --   --   --   --  1.008  --   HGBA1C 5.3  --   --   --   --   --   --   --   --   --   --   --   --   --   AMMONIA  --   --   --   --   --   --  17 16  --   --   --   --   --   --   BNP  --    < >  --  72.3 53.6 27.8  --   --  41.4  --  31.9 27.5  --   --   MG 1.4*  --   --  1.7 1.6* 1.9  --  1.8  --  1.8 1.8 1.8  --   --   PHOS  --   --   --  3.6 3.6 3.8  --  3.9  --   --   --   --   --   --   CALCIUM 8.1*  --   --  8.3* 7.8* 7.7*  --  8.0*  --  7.7* 8.1* 7.8*  --   --    < > = values in this interval not displayed.      Recent Labs  Lab 08/01/23 0630 08/01/23 0913 08/01/23 1310 08/02/23 0549 08/03/23 0513 08/04/23 1610 08/04/23 0845 08/05/23 0457 08/05/23 0500 08/06/23 0018 08/06/23 0509 08/07/23 0243 08/07/23 0601 08/07/23 0740  CRP  --   --   --   --   --   --   --   --   --   --  12.6* 15.6*  --   --   PROCALCITON  --   --   --   --   --   --   --   --   --   --  0.65 1.35   --   --   LATICACIDVEN  --   --   --   --   --   --   --   --   --   --   --   --  2.2* 2.2*  INR  --   --   --   --   --   --   --   --   --   --   --  1.4*  --   --   TSH  --   --  1.499  --   --   --   --   --   --   --   --   --  1.008  --   HGBA1C 5.3  --   --   --   --   --   --   --   --   --   --   --   --   --   AMMONIA  --   --   --   --   --   --  17 16  --   --   --   --   --   --   BNP  --    < >  --    < > 53.6 27.8  --   --  41.4  --  31.9 27.5  --   --   MG 1.4*  --   --    < > 1.6* 1.9  --  1.8  --  1.8 1.8 1.8  --   --   CALCIUM 8.1*  --   --    < > 7.8* 7.7*  --  8.0*  --  7.7* 8.1* 7.8*  --   --    < > = values in this interval not displayed.    --------------------------------------------------------------------------------------------------------------- Lab Results  Component Value Date   CHOL 79 04/07/2023   HDL 17 (L) 04/07/2023   LDLCALC 47 04/07/2023   TRIG 75 04/07/2023   CHOLHDL 4.6 04/07/2023    Lab Results  Component Value Date   HGBA1C 5.3 08/01/2023   Recent Labs    08/07/23 0601  TSH 1.008  FREET4 1.01    No results for input(s): "VITAMINB12", "FOLATE", "FERRITIN", "TIBC", "IRON", "RETICCTPCT" in the last  72 hours.  ------------------------------------------------------------------------------------------------------------------ Cardiac Enzymes No results for input(s): "CKMB", "TROPONINI", "MYOGLOBIN" in the last 168 hours.  Invalid input(s): "CK"  Micro Results Recent Results (from the past 240 hours)  Resp panel by RT-PCR (RSV, Flu A&B, Covid) Anterior Nasal Swab     Status: None   Collection Time: 08/01/23  7:13 AM   Specimen: Anterior Nasal Swab  Result Value Ref Range Status   SARS Coronavirus 2 by RT PCR NEGATIVE NEGATIVE Final   Influenza A by PCR NEGATIVE NEGATIVE Final   Influenza B by PCR NEGATIVE NEGATIVE Final    Comment: (NOTE) The Xpert Xpress SARS-CoV-2/FLU/RSV plus assay is intended as an aid in the diagnosis of  influenza from Nasopharyngeal swab specimens and should not be used as a sole basis for treatment. Nasal washings and aspirates are unacceptable for Xpert Xpress SARS-CoV-2/FLU/RSV testing.  Fact Sheet for Patients: BloggerCourse.com  Fact Sheet for Healthcare Providers: SeriousBroker.it  This test is not yet approved or cleared by the [place] FDA and has been authorized for detection and/or diagnosis of SARS-CoV-2 by FDA under an Emergency Use Authorization (EUA). This EUA will remain in effect (meaning this test can be used) for the duration of the COVID-19 declaration under Section 564(b)(1) of the Act, 21 U.S.C. section 360bbb-3(b)(1), unless the authorization is terminated or revoked.     Resp Syncytial Virus by PCR NEGATIVE NEGATIVE Final    Comment: (NOTE) Fact Sheet for Patients: BloggerCourse.com  Fact Sheet for Healthcare Providers: SeriousBroker.it  This test is not yet approved or cleared by the [place] FDA and has been authorized for detection and/or diagnosis of SARS-CoV-2 by FDA under an Emergency Use Authorization (EUA). This EUA will remain in effect (meaning this test can be used) for the duration of the COVID-19 declaration under Section 564(b)(1) of the Act, 21 U.S.C. section 360bbb-3(b)(1), unless the authorization is terminated or revoked.  Performed at [facility], [address], [place], [place] [address]   Respiratory (~20 pathogens) panel by PCR     Status: None   Collection Time: 08/07/23  1:25 AM   Specimen: Nasopharyngeal Swab; Respiratory  Result Value Ref Range Status   Adenovirus NOT DETECTED NOT DETECTED Final   Coronavirus 229E NOT DETECTED NOT DETECTED Final    Comment: (NOTE) The Coronavirus on the Respiratory Panel, DOES NOT test for the novel  Coronavirus (2019 nCoV)    Coronavirus HKU1 NOT DETECTED NOT DETECTED  Final   Coronavirus NL63 NOT DETECTED NOT DETECTED Final   Coronavirus OC43 NOT DETECTED NOT DETECTED Final   Metapneumovirus NOT DETECTED NOT DETECTED Final   Rhinovirus / Enterovirus NOT DETECTED NOT DETECTED Final   Influenza A NOT DETECTED NOT DETECTED Final   Influenza B NOT DETECTED NOT DETECTED Final   Parainfluenza Virus 1 NOT DETECTED NOT DETECTED Final   Parainfluenza Virus 2 NOT DETECTED NOT DETECTED Final   Parainfluenza Virus 3 NOT DETECTED NOT DETECTED Final   Parainfluenza Virus 4 NOT DETECTED NOT DETECTED Final   Respiratory Syncytial Virus NOT DETECTED NOT DETECTED Final   Bordetella pertussis NOT DETECTED NOT DETECTED Final   Bordetella Parapertussis NOT DETECTED NOT DETECTED Final   Chlamydophila pneumoniae NOT DETECTED NOT DETECTED Final   Mycoplasma pneumoniae NOT DETECTED NOT DETECTED Final    Comment: Performed at [facility], [address], [place], [place] [address]  Culture, blood (Routine X 2) w Reflex to ID Panel     Status: None (Preliminary result)  Collection Time: 08/07/23  2:43 AM   Specimen: BLOOD LEFT ARM  Result Value Ref Range Status   Specimen Description BLOOD LEFT ARM  Final   Special Requests   Final    BOTTLES DRAWN AEROBIC AND ANAEROBIC Blood Culture adequate volume   Culture   Final    NO GROWTH < 12 HOURS Performed at [facility], [address] [place], [place] [address]    Report Status PENDING  Incomplete  Culture, blood (Routine X 2) w Reflex to ID Panel     Status: None (Preliminary result)   Collection Time: 08/07/23  2:45 AM   Specimen: BLOOD RIGHT HAND  Result Value Ref Range Status   Specimen Description BLOOD RIGHT HAND  Final   Special Requests   Final    BOTTLES DRAWN AEROBIC AND ANAEROBIC Blood Culture adequate volume   Culture   Final    NO GROWTH < 12 HOURS Performed at [facility], [address], [place], [place] [address]    Report Status PENDING  Incomplete    Radiology Report DG CHEST  PORT 1 VIEW Result Date: 08/07/2023 CLINICAL DATA:  Follow-up right basilar opacity EXAM: PORTABLE CHEST 1 VIEW COMPARISON:  08/05/2023 FINDINGS: Cardiac shadow is within normal limits. Persistent small right effusion and basilar atelectasis is noted. Left lung remains clear. No bony abnormality is seen. Postsurgical changes in the cervical spine are noted. IMPRESSION: Stable right basilar opacity and effusion. Electronically Signed   By: Alcide Clever M.D.   On: 08/07/2023 02:25   US Abdomen Limited RUQ (LIVER/GB) Result Date: 08/07/2023 CLINICAL DATA:  Elevated LFTs EXAM: ULTRASOUND ABDOMEN LIMITED RIGHT UPPER QUADRANT COMPARISON:  CT from earlier in the same day. FINDINGS: Gallbladder: No gallstones or wall thickening visualized. No sonographic Murphy sign noted by sonographer. Common bile duct: Diameter: 4.7 mm. Liver: No focal lesion identified. Within normal limits in parenchymal echogenicity. Portal vein is patent on color Doppler imaging with normal direction of blood flow towards the liver. Other: None. IMPRESSION: No acute abnormality noted. Electronically Signed   By: Alcide Clever M.D.   On: 08/07/2023 00:44   CT CHEST ABDOMEN PELVIS W CONTRAST Result Date: 08/06/2023 CLINICAL DATA:  Abdomen or its really busy EXAM: CT CHEST, ABDOMEN, AND PELVIS WITH CONTRAST TECHNIQUE: Multidetector CT imaging of the chest, abdomen and pelvis was performed following the standard protocol during bolus administration of intravenous contrast. RADIATION DOSE REDUCTION: This exam was performed according to the departmental dose-optimization program which includes automated exposure control, adjustment of the mA and/or kV according to patient size and/or use of iterative reconstruction technique. CONTRAST:  75mL OMNIPAQUE IOHEXOL 350 MG/ML SOLN COMPARISON:  CT abdomen pelvis 04/20/2020 FINDINGS: CT CHEST FINDINGS Cardiovascular: Moderate coronary artery calcification. Global cardiac size within limits. Trace pericardial  effusion. Central pulmonary arteries are of caliber. Mild atherosclerotic calcification within thoracic aorta. No aortic aneurysm. Mediastinum/Nodes: No pathologic thoracic adenopathy. Stable 19 mm nodule within the posterior right thyroid lobe, not well characterized on this examination. Esophagus unremarkable. 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 No superimposed focal pulmonary infiltrate. No pneumothorax. Central airways are widely patent. Musculoskeletal: No chest wall mass or suspicious bone lesions identified. Probable bone island within the T9 vertebral body. CT ABDOMEN PELVIS FINDINGS Hepatobiliary: No focal liver abnormality is seen. No gallstones, gallbladder wall thickening, or biliary dilatation. Pancreas: Unremarkable Spleen: Mild  splenomegaly with the spleen measuring 13.5 cm in greatest dimension. Enlarging 3.4 cm complex hypoechoic lesion within the upper pole the spleen demonstrating heterogeneous attenuation with a probable solid component superiorly. Differential considerations include a hypoenhancing hamartoma, low-grade lymphoma, or potentially inflammatory pseudotumor, however, this is not well characterized on this examination. Adrenals/Urinary Tract: Bilateral adrenal nodules have developed since prior examination and are indeterminate, measuring 40-55 Hounsfield units in density. These measure up to 2.5 cm in diameter on left. The kidneys are normal in size and position. 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 Simple cortical cyst noted within the upper pole the right kidney for which no follow-up imaging is recommended. The kidneys are otherwise unremarkable. The bladder is partially decompressed and is  unremarkable. Stomach/Bowel: Mild perihepatic ascites. The stomach, small, and large bowel are unremarkable. Appendix normal. No free intraperitoneal gas. Vascular/Lymphatic: Aortic atherosclerosis. No enlarged abdominal or pelvic lymph nodes. Reproductive: Mild prostatic hypertrophy. Other: Mild subcutaneous body wall edema. Mild retroperitoneal edema. Small bilateral fat containing inguinal hernias. Musculoskeletal: No acute or significant osseous findings. IMPRESSION: 1. No acute intrathoracic or intra-abdominal pathology identified. 2. Moderate coronary artery calcification. 3. 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 4. Enlarging 3.4 cm complex hypoechoic lesion within the upper pole the spleen demonstrating heterogeneous attenuation with a probable solid component superiorly. Differential considerations include a hypoenhancing hamartoma, low-grade lymphoma, or potentially inflammatory pseudotumor, however, this is not well characterized on this examination. This could be simultaneously assessed with dedicated MRI examination of the adrenal glands. 5. Bilateral adrenal nodules have developed since prior examination and are indeterminate, measuring 40-55 Hounsfield units in density. These measure up to 2.5 cm in diameter on left. Dedicated contrast enhanced MRI examination is recommended for further characterization. 6. Mild splenomegaly. 7. Mild anasarca with trace ascites and body wall retroperitoneal edema. 8. Stable 19 mm nodule within the posterior right thyroid lobe, not well characterized on this examination. Recommend thyroid US (ref: J Am Coll Radiol. 2015 Feb;12(2): 143-50). Aortic Atherosclerosis (ICD10-I70.0). Electronically Signed   By: Helyn Numbers M.D.   On: 08/06/2023 15:14   ECHOCARDIOGRAM COMPLETE Result Date: 08/06/2023    ECHOCARDIOGRAM  REPORT   Patient Name:   MIKAL BLASDELL Date of Exam: 08/06/2023 Medical Rec #:  [id number]        Height:       71.0 in Accession #:    [id number]       Weight:       191.0 lb Date of Birth:  09/26/1950        BSA:          2.068 m Patient Age:    72 years         BP:           110/87 mmHg Patient Gender: M                HR:           72 bpm. Exam Location:  Inpatient Procedure: 2D Echo, Cardiac Doppler, Color Doppler and Intracardiac            Opacification Agent (Both Spectral and Color Flow Doppler were            utilized during procedure). Indications:    Stroke  History:  Patient has prior history of Echocardiogram examinations, most                 recent 04/07/2023. CHF, Stroke, Signs/Symptoms:Syncope; Risk                 Factors:Dyslipidemia, Diabetes, Hypertension and Sleep Apnea.  Sonographer:    Sheralyn Boatman RDCS Referring Phys: [id number] DENISE A WOLFE  Sonographer Comments: Technically difficult study due to poor echo windows, Technically challenging study due to limited acoustic windows, suboptimal parasternal window, suboptimal apical window and suboptimal subcostal window. Image acquisition challenging due to respiratory motion. Suboptimal windows despite attempts to turn patient. IMPRESSIONS  1. Left ventricular ejection fraction, by estimation, is 60 to 65%. The left ventricle has normal function. The left ventricle has no regional wall motion abnormalities. Left ventricular diastolic parameters are consistent with Grade I diastolic dysfunction (impaired relaxation).  2. Right ventricular systolic function is normal. The right ventricular size is normal. Tricuspid regurgitation signal is inadequate for assessing PA pressure.  3. The mitral valve was not well visualized. No evidence of mitral valve regurgitation. No evidence of mitral stenosis.  4. The aortic valve was not well visualized. There is mild calcification of the aortic valve. Aortic valve regurgitation is not visualized. Aortic valve  sclerosis is present, with no evidence of aortic valve stenosis. Aortic valve Vmax measures 2.19 m/s.  5. The inferior vena cava is normal in size with greater than 50% respiratory variability, suggesting right atrial pressure of 3 mmHg. FINDINGS  Left Ventricle: Left ventricular ejection fraction, by estimation, is 60 to 65%. The left ventricle has normal function. The left ventricle has no regional wall motion abnormalities. Definity contrast agent was given IV to delineate the left ventricular  endocardial borders. The left ventricular internal cavity size was normal in size. There is no left ventricular hypertrophy. Left ventricular diastolic parameters are consistent with Grade I diastolic dysfunction (impaired relaxation). Right Ventricle: The right ventricular size is normal. No increase in right ventricular wall thickness. Right ventricular systolic function is normal. Tricuspid regurgitation signal is inadequate for assessing PA pressure. Left Atrium: Left atrial size was normal in size. Right Atrium: Right atrial size was normal in size. Pericardium: There is no evidence of pericardial effusion. Mitral Valve: The mitral valve was not well visualized. No evidence of mitral valve regurgitation. No evidence of mitral valve stenosis. Tricuspid Valve: The tricuspid valve is not well visualized. Tricuspid valve regurgitation is not demonstrated. No evidence of tricuspid stenosis. Aortic Valve: The aortic valve was not well visualized. There is mild calcification of the aortic valve. Aortic valve regurgitation is not visualized. Aortic valve sclerosis is present, with no evidence of aortic valve stenosis. Aortic valve mean gradient measures 9.0 mmHg. Aortic valve peak gradient measures 19.2 mmHg. Aortic valve area, by VTI measures 2.56 cm. Pulmonic Valve: The pulmonic valve was normal in structure. Pulmonic valve regurgitation is not visualized. No evidence of pulmonic stenosis. Aorta: The aortic root is normal  in size and structure. Venous: The inferior vena cava is normal in size with greater than 50% respiratory variability, suggesting right atrial pressure of 3 mmHg. IAS/Shunts: No atrial level shunt detected by color flow Doppler.  LEFT VENTRICLE PLAX 2D LVIDd:         4.90 cm LVIDs:         3.50 cm LV PW:         1.00 cm LV IVS:        0.90 cm LVOT diam:  2.30 cm LV SV:         91 LV SV Index:   44 LVOT Area:     4.15 cm  LV Volumes (MOD) LV vol d, MOD A2C: 95.7 ml LV vol d, MOD A4C: 108.0 ml LV vol s, MOD A2C: 46.4 ml LV vol s, MOD A4C: 38.6 ml LV SV MOD A2C:     49.3 ml LV SV MOD A4C:     108.0 ml LV SV MOD BP:      67.9 ml RIGHT VENTRICLE             IVC RV S prime:     14.00 cm/s  IVC diam: 1.90 cm TAPSE (M-mode): 3.2 cm LEFT ATRIUM             Index        RIGHT ATRIUM           Index LA diam:        3.50 cm 1.69 cm/m   RA Area:     10.90 cm LA Vol (A2C):   41.3 ml 19.97 ml/m  RA Volume:   19.40 ml  9.38 ml/m LA Vol (A4C):   62.6 ml 30.28 ml/m LA Biplane Vol: 51.3 ml 24.81 ml/m  AORTIC VALVE AV Area (Vmax):    2.16 cm AV Area (Vmean):   2.48 cm AV Area (VTI):     2.56 cm AV Vmax:           219.00 cm/s AV Vmean:          139.000 cm/s AV VTI:            0.355 m AV Peak Grad:      19.2 mmHg AV Mean Grad:      9.0 mmHg LVOT Vmax:         114.00 cm/s LVOT Vmean:        83.100 cm/s LVOT VTI:          0.219 m LVOT/AV VTI ratio: 0.62  AORTA Ao Root diam: 3.10 cm MITRAL VALVE MV Area (PHT): 3.08 cm    SHUNTS MV Decel Time: 246 msec    Systemic VTI:  0.22 m MV E velocity: 82.70 cm/s  Systemic Diam: 2.30 cm MV A velocity: 90.30 cm/s MV E/A ratio:  0.92 Donato Schultz MD Electronically signed by Donato Schultz MD Signature Date/Time: 08/06/2023/1:02:26 PM    Final    DG Chest Port 1 View Result Date: 08/05/2023 CLINICAL DATA:  Shortness of breath. EXAM: PORTABLE CHEST 1 VIEW COMPARISON:  August 04, 2023. FINDINGS: Stable cardiomediastinal silhouette. Left lung is clear. Stable right basilar opacities are noted  concerning for subsegmental atelectasis or possibly pneumonia. Bony thorax is unremarkable. IMPRESSION: Stable right basilar opacities as noted above. Electronically Signed   By: Lupita Raider M.D.   On: 08/05/2023 11:23      Signature  -   Susa Raring M.D on 08/07/2023 at 8:49 AM   -  To page go to [email or web address]

## 2023-08-07 NOTE — Progress Notes (Addendum)
 Concern for aspiration pneumonia RN reported that patient spiked temperature 103 F and respite rate went up to 28-23.  Currently O2 sat 90% on 2 L oxygen. Patient has been admitted for progressive weakness, anemia, CVA, C-spine compression, and metabolic encephalopathy.  Given patient has a new onset of temperature and oxygen requirement obtaining chest x-ray, CBC, BMP, sputum culture and respiratory panel.  Will obtain blood cultures.   Continue supplemental oxygen and supportive care. Will hold any IV antibiotic unless CBC shows significant leukocytosis or chest x-ray evidence of any pneumonia.  Update, Chest x-ray no evidence of basilar opacity and effusion.  Pending CBC.  BMP showing low potassium 3.3.  Respiratory panel unremarkable.  Pending blood culture and UA result.  However elevated pro calcitonin 1.35 but there is no definite source of infection found out yet.   Patient blood pressure has been dropped 96/53 this is MAP 67) still having high temperature around 102 degree at 3 AM. -Check lactic acid. -Increasing the fluid rate LR 75 to 125 cc/h. - At this point unclear the source of infection however given patient has new requirement of oxygen and having recent stroke patient might have some aspiration.  Patient is allergic to penicillin group medication.  Starting IV ceftriaxone. Can narrow down antibiotic once blood cultures will be available.   Tereasa Coop, MD [facility] 08/07/2023, 1:23 AM

## 2023-08-07 NOTE — Progress Notes (Addendum)
 Initial Nutrition Assessment  DOCUMENTATION CODES:   Severe malnutrition in context of chronic illness  INTERVENTION:   If tube feeding is initiated, recommend via NGT: Osmolite at 60 ml/h (1440 ml per day) **Initiate at 20mL and increase by 10mL q4h.  Prosource TF20 60 ml BID  Provides 2320 kcal, 130 gm protein, 1094 ml free water daily  Order bi-weekly weights.  Recommend liberalizing diet to Regular if/when diet is advanced.   NUTRITION DIAGNOSIS:   Severe Malnutrition related to chronic illness as evidenced by percent weight loss, moderate fat depletion, moderate muscle depletion.  GOAL:   Patient will meet greater than or equal to 90% of their needs  MONITOR:   Diet advancement  REASON FOR ASSESSMENT:   Malnutrition Screening Tool    ASSESSMENT:   PMH: CVA in May 2024, OSA, T2DM, HTN, prostate cancer,   Admitted with symptomatic anemia. Pt presents with metabolic encephalopathy, generalized weakness. Underlying depression. 3/27- Early sepsis with aspiration pneumonia   Pt asleep at RD visit, pt's wife present in room. Pt's wife reports decreased intake in past 3 months due to new diabetes medication. Pt would only eat bites of meals. Pt's wife reports aspiration morning of 3/28 which caused NPO status, currently. Wife does not know what patient aspirated on. Pt not alert enough to be eating.   RD messaged MD on NGT recommendation. [facility] service would be unable to get to on Friday afternoon but recommended bedside NGT tube placement over weekend and EN recs in note above.   Intake 3/27: (per wife and sitter) 2 jello 1-2 Ensure Enlive 2 ice cream cups  Intake: Nursing flowsheets document meal completions between 0-50%.   Medications reviewed and include: Vit B12, ferrous sulfate, folic acid, SSI 0-15 units, KCl, protonix, flomax, thiamine, IVF 100 mL/hr  Labs reviewed: Sodium 133 L, Potassium 3.3 L, AST 206 H, ALT 186   Intake/Output Summary (Last 24  hours) at 08/07/2023 1459 Last data filed at 08/07/2023 0553 Gross per 24 hour  Intake 120 ml  Output 900 ml  Net -780 ml    Weights reviewed. Admit weight 86.6 kg. No new weight since adm. -11.7 kg (12%) wt loss in 4 months. Significant per ASPEN.   NUTRITION - FOCUSED PHYSICAL EXAM:  Flowsheet Row Most Recent Value  Orbital Region Moderate depletion  Upper Arm Region Moderate depletion  Thoracic and Lumbar Region Mild depletion  Buccal Region Mild depletion  Temple Region Moderate depletion  Clavicle Bone Region Moderate depletion  Clavicle and Acromion Bone Region Moderate depletion  Scapular Bone Region Moderate depletion  Dorsal Hand No depletion  Patellar Region Mild depletion  Anterior Thigh Region Mild depletion  Posterior Calf Region Moderate depletion  Edema (RD Assessment) Mild  Hair Reviewed  Eyes Reviewed  Mouth Reviewed  Skin Reviewed  Nails Reviewed       Diet Order:   Diet Order             Diet NPO time specified Except for: Sips with Meds  Diet effective now                   EDUCATION NEEDS:   Education needs have been addressed  Skin:  Skin Assessment: Reviewed RN Assessment (dermitis buttocks)  Last BM:  3/27  Height:   Ht Readings from Last 1 Encounters:  08/01/23 5\' 11"  (1.803 m)    Weight:   Wt Readings from Last 1 Encounters:  08/01/23 86.6 kg    Ideal Body Weight:  77.7 kg  BMI:  Body mass index is 26.64 kg/m.  Estimated Nutritional Needs:   Kcal:  2200-2600  Protein:  120-140  Fluid:  1 mL/kcal or per MD  Kathrynn Speed, MPH, RD, LDN Clinical Dietitian Contact information can be found at [facility].

## 2023-08-07 NOTE — Progress Notes (Signed)
 Pharmacy Antibiotic Note  Kenneth [name] is a 73 y.o. male admitted on 08/01/2023 with pneumonia.  Pharmacy has been consulted for unasyn dosing.  Plan: Unasyn 3 grams iv q6h  Height: 5\' 11"  (180.3 cm) Weight: 86.6 kg (191 lb) IBW/kg (Calculated) : 75.3  Temp (24hrs), Avg:100.9 F (38.3 C), Min:97.7 F (36.5 C), Max:103 F (39.4 C)  Recent Labs  Lab 08/04/23 0452 08/05/23 0457 08/06/23 0018 08/06/23 0509 08/07/23 0243 08/07/23 0601 08/07/23 0740  WBC 8.5 9.0 9.8 9.2  --  10.8*  --   CREATININE 0.89 0.88 0.94 0.96 1.16  --   --   LATICACIDVEN  --   --   --   --   --  2.2* 2.2*    Estimated Creatinine Clearance: 59.5 mL/min (by C-G formula based on SCr of 1.16 mg/dL).    Allergies  Allergen Reactions   Jardiance [Empagliflozin] Other (See Comments)    To the hospital- due to complications   Fluoxetine Other (See Comments)    Prefers to NOT take this   Penicillins Other (See Comments)    CHILDHOOD ALLERGY- exact reaction? Has patient had a PCN reaction causing immediate rash, facial/tongue/throat swelling, SOB or lightheadedness with hypotension: Unknown Has patient had a PCN reaction causing severe rash involving mucus membranes or skin necrosis: Unknown Has patient had a PCN reaction that required hospitalization: Unknown Has patient had a PCN reaction occurring within the last 10 years: Unknown If all of the above answers are "NO", then may proceed with Cephalosporin use.     Silodosin Other (See Comments)    Vertigo      Thank you for allowing pharmacy to be a part of this patient's care.  Greta Doom BS, PharmD, BCPS Clinical Pharmacist 08/07/2023 9:27 AM  Contact: [phone] after 3 PM  "Be curious, not judgmental..." -Debbora Dus

## 2023-08-07 NOTE — Plan of Care (Signed)
  Problem: Clinical Measurements: Goal: Diagnostic test results will improve Outcome: Not Progressing Goal: Respiratory complications will improve Outcome: Not Progressing Goal: Cardiovascular complication will be avoided Outcome: Not Progressing   Problem: Nutrition: Goal: Adequate nutrition will be maintained Outcome: Not Progressing   Problem: Pain Managment: Goal: General experience of comfort will improve and/or be controlled Outcome: Progressing

## 2023-08-07 NOTE — Progress Notes (Signed)
 Pharmacy Note- Penicillin Allergy Clarification   ASSESSMENT:   PEN-FAST Scoring   Five years or less since last reaction 0  Anaphylaxis/Angioedema OR Severe cutaneous adverse reaction  0  Treatment required for reaction  0  Total Score 0 points - Very low risk of positive penicillin allergy test (<1%)      Type of intervention (select all that apply):  Direct IV therapy  Impact on therapy (select all that apply):  Other (please elaborate)  No immediate reactions noted. Vitals remained stable. No rashes noted Will follow-up on skin assessments 3/29 and 3/30    PLAN: - unasyn 3 gram iv x1 - Q74min vitals monitoring x 1 hour  - Epi-Pen PRN  - IV diphenhydramine PRN  - plan communicated to primary RN   POST-CHALLENGE FOLLOW-UP: Patient tolerated dose well. No reactions noted by nursing. Vitals were stable.  Continue treatment with Unasyn 3 grams iv q6h  If no reactions noted throughout the weekend, allergy can safely be removed from profile.  Contact Provider for follow-through prior to removal  Greta Doom BS, PharmD, BCPS Clinical Pharmacist 08/07/2023 1:01 PM  Contact: [phone] after 3 PM  "Be curious, not judgmental..." -Debbora Dus

## 2023-08-08 ENCOUNTER — Other Ambulatory Visit: Payer: Self-pay

## 2023-08-08 ENCOUNTER — Inpatient Hospital Stay (HOSPITAL_COMMUNITY)

## 2023-08-08 DIAGNOSIS — Z794 Long term (current) use of insulin: Secondary | ICD-10-CM

## 2023-08-08 DIAGNOSIS — J9601 Acute respiratory failure with hypoxia: Secondary | ICD-10-CM

## 2023-08-08 DIAGNOSIS — J690 Pneumonitis due to inhalation of food and vomit: Secondary | ICD-10-CM | POA: Diagnosis not present

## 2023-08-08 DIAGNOSIS — A419 Sepsis, unspecified organism: Secondary | ICD-10-CM | POA: Diagnosis not present

## 2023-08-08 DIAGNOSIS — S36119A Unspecified injury of liver, initial encounter: Secondary | ICD-10-CM

## 2023-08-08 DIAGNOSIS — Z7984 Long term (current) use of oral hypoglycemic drugs: Secondary | ICD-10-CM

## 2023-08-08 DIAGNOSIS — D6859 Other primary thrombophilia: Secondary | ICD-10-CM | POA: Diagnosis not present

## 2023-08-08 DIAGNOSIS — I63443 Cerebral infarction due to embolism of bilateral cerebellar arteries: Secondary | ICD-10-CM

## 2023-08-08 DIAGNOSIS — D649 Anemia, unspecified: Secondary | ICD-10-CM | POA: Diagnosis not present

## 2023-08-08 DIAGNOSIS — R531 Weakness: Secondary | ICD-10-CM | POA: Diagnosis not present

## 2023-08-08 DIAGNOSIS — E785 Hyperlipidemia, unspecified: Secondary | ICD-10-CM | POA: Diagnosis not present

## 2023-08-08 DIAGNOSIS — R4182 Altered mental status, unspecified: Secondary | ICD-10-CM | POA: Diagnosis not present

## 2023-08-08 DIAGNOSIS — R7401 Elevation of levels of liver transaminase levels: Secondary | ICD-10-CM | POA: Diagnosis not present

## 2023-08-08 LAB — PROCALCITONIN: Procalcitonin: 1.17 ng/mL

## 2023-08-08 LAB — POCT I-STAT 7, (LYTES, BLD GAS, ICA,H+H)
Acid-base deficit: 4 mmol/L — ABNORMAL HIGH (ref 0.0–2.0)
Bicarbonate: 20.4 mmol/L (ref 20.0–28.0)
Calcium, Ion: 1.18 mmol/L (ref 1.15–1.40)
HCT: 23 % — ABNORMAL LOW (ref 39.0–52.0)
Hemoglobin: 7.8 g/dL — ABNORMAL LOW (ref 13.0–17.0)
O2 Saturation: 100 %
Patient temperature: 98.8
Potassium: 4.2 mmol/L (ref 3.5–5.1)
Sodium: 138 mmol/L (ref 135–145)
TCO2: 21 mmol/L — ABNORMAL LOW (ref 22–32)
pCO2 arterial: 35 mmHg (ref 32–48)
pH, Arterial: 7.374 (ref 7.35–7.45)
pO2, Arterial: 534 mmHg — ABNORMAL HIGH (ref 83–108)

## 2023-08-08 LAB — BRAIN NATRIURETIC PEPTIDE: B Natriuretic Peptide: 49.5 pg/mL (ref 0.0–100.0)

## 2023-08-08 LAB — COMPREHENSIVE METABOLIC PANEL WITH GFR
ALT: 361 U/L — ABNORMAL HIGH (ref 0–44)
AST: 376 U/L — ABNORMAL HIGH (ref 15–41)
Albumin: 1.6 g/dL — ABNORMAL LOW (ref 3.5–5.0)
Alkaline Phosphatase: 211 U/L — ABNORMAL HIGH (ref 38–126)
Anion gap: 11 (ref 5–15)
BUN: 32 mg/dL — ABNORMAL HIGH (ref 8–23)
CO2: 21 mmol/L — ABNORMAL LOW (ref 22–32)
Calcium: 7.9 mg/dL — ABNORMAL LOW (ref 8.9–10.3)
Chloride: 105 mmol/L (ref 98–111)
Creatinine, Ser: 1.09 mg/dL (ref 0.61–1.24)
GFR, Estimated: >60 mL/min (ref >60)
Glucose, Bld: 124 mg/dL — ABNORMAL HIGH (ref 70–99)
Potassium: 4 mmol/L (ref 3.5–5.1)
Sodium: 137 mmol/L (ref 135–145)
Total Bilirubin: 1.7 mg/dL — ABNORMAL HIGH (ref 0.0–1.2)
Total Protein: 4 g/dL — ABNORMAL LOW (ref 6.5–8.1)

## 2023-08-08 LAB — BLOOD GAS, ARTERIAL
Acid-base deficit: 1 mmol/L (ref 0.0–2.0)
Bicarbonate: 22.2 mmol/L (ref 20.0–28.0)
Drawn by: 548791
O2 Saturation: 86.7 %
Patient temperature: 38.2
pCO2 arterial: 34 mmHg (ref 32–48)
pH, Arterial: 7.43 (ref 7.35–7.45)
pO2, Arterial: 54 mmHg — ABNORMAL LOW (ref 83–108)

## 2023-08-08 LAB — PROTIME-INR
INR: 1.4 — ABNORMAL HIGH (ref 0.8–1.2)
Prothrombin Time: 17.1 s — ABNORMAL HIGH (ref 11.4–15.2)

## 2023-08-08 LAB — AMMONIA: Ammonia: 25 umol/L (ref 9–35)

## 2023-08-08 LAB — CBC WITH DIFFERENTIAL/PLATELET
Abs Immature Granulocytes: 0.26 10*3/uL — ABNORMAL HIGH (ref 0.00–0.07)
Basophils Absolute: 0 10*3/uL (ref 0.0–0.1)
Basophils Relative: 0 %
Eosinophils Absolute: 0 10*3/uL (ref 0.0–0.5)
Eosinophils Relative: 0 %
HCT: 23.6 % — ABNORMAL LOW (ref 39.0–52.0)
Hemoglobin: 8.1 g/dL — ABNORMAL LOW (ref 13.0–17.0)
Immature Granulocytes: 3 %
Lymphocytes Relative: 10 %
Lymphs Abs: 1.1 10*3/uL (ref 0.7–4.0)
MCH: 29.7 pg (ref 26.0–34.0)
MCHC: 34.3 g/dL (ref 30.0–36.0)
MCV: 86.4 fL (ref 80.0–100.0)
Monocytes Absolute: 2.2 10*3/uL — ABNORMAL HIGH (ref 0.1–1.0)
Monocytes Relative: 21 %
Neutro Abs: 7.1 10*3/uL (ref 1.7–7.7)
Neutrophils Relative %: 66 %
Platelets: 89 10*3/uL — ABNORMAL LOW (ref 150–400)
RBC: 2.73 MIL/uL — ABNORMAL LOW (ref 4.22–5.81)
RDW: 15.8 % — ABNORMAL HIGH (ref 11.5–15.5)
WBC: 10.6 10*3/uL — ABNORMAL HIGH (ref 4.0–10.5)
nRBC: 0 % (ref 0.0–0.2)

## 2023-08-08 LAB — GLUCOSE, CAPILLARY
Glucose-Capillary: 122 mg/dL — ABNORMAL HIGH (ref 70–99)
Glucose-Capillary: 128 mg/dL — ABNORMAL HIGH (ref 70–99)
Glucose-Capillary: 132 mg/dL — ABNORMAL HIGH (ref 70–99)
Glucose-Capillary: 132 mg/dL — ABNORMAL HIGH (ref 70–99)
Glucose-Capillary: 145 mg/dL — ABNORMAL HIGH (ref 70–99)

## 2023-08-08 LAB — PHOSPHORUS: Phosphorus: 4 mg/dL (ref 2.5–4.6)

## 2023-08-08 LAB — C-REACTIVE PROTEIN: CRP: 18.6 mg/dL — ABNORMAL HIGH (ref <1.0)

## 2023-08-08 LAB — VITAMIN B6: Vitamin B6: 5.7 ug/L (ref 3.4–65.2)

## 2023-08-08 LAB — MAGNESIUM: Magnesium: 1.7 mg/dL (ref 1.7–2.4)

## 2023-08-08 MED ORDER — LACTATED RINGERS IV BOLUS
500.0000 mL | Freq: Once | INTRAVENOUS | Status: AC
  Administered 2023-08-08: 500 mL via INTRAVENOUS

## 2023-08-08 MED ORDER — FENTANYL CITRATE PF 50 MCG/ML IJ SOSY: 25.0000 ug | PREFILLED_SYRINGE | Freq: Once | INTRAMUSCULAR | Status: DC

## 2023-08-08 MED ORDER — ETOMIDATE 2 MG/ML IV SOLN
20.0000 mg | Freq: Once | INTRAVENOUS | Status: AC
  Administered 2023-08-08: 20 mg via INTRAVENOUS

## 2023-08-08 MED ORDER — DOCUSATE SODIUM 50 MG/5ML PO LIQD
100.0000 mg | Freq: Two times a day (BID) | ORAL | Status: DC
  Administered 2023-08-08 – 2023-08-13 (×8): 100 mg
  Filled 2023-08-08 (×10): qty 10

## 2023-08-08 MED ORDER — ORAL CARE MOUTH RINSE: 15.0000 mL | OROMUCOSAL | Status: DC | PRN

## 2023-08-08 MED ORDER — SODIUM CHLORIDE 0.9% FLUSH
10.0000 mL | Freq: Two times a day (BID) | INTRAVENOUS | Status: DC
  Administered 2023-08-08 – 2023-08-14 (×9): 10 mL
  Administered 2023-08-14: 20 mL

## 2023-08-08 MED ORDER — INSULIN ASPART 100 UNIT/ML IJ SOLN
0.0000 [IU] | INTRAMUSCULAR | Status: DC
  Administered 2023-08-08 – 2023-08-09 (×4): 2 [IU] via SUBCUTANEOUS
  Administered 2023-08-09: 3 [IU] via SUBCUTANEOUS
  Administered 2023-08-09: 2 [IU] via SUBCUTANEOUS
  Administered 2023-08-09 – 2023-08-12 (×12): 3 [IU] via SUBCUTANEOUS
  Administered 2023-08-12: 2 [IU] via SUBCUTANEOUS
  Administered 2023-08-12 – 2023-08-13 (×4): 3 [IU] via SUBCUTANEOUS
  Administered 2023-08-13 – 2023-08-14 (×7): 5 [IU] via SUBCUTANEOUS

## 2023-08-08 MED ORDER — LACTATED RINGERS IV BOLUS
1000.0000 mL | Freq: Once | INTRAVENOUS | Status: AC
  Administered 2023-08-08: 1000 mL via INTRAVENOUS

## 2023-08-08 MED ORDER — THIAMINE MONONITRATE 100 MG PO TABS
100.0000 mg | ORAL_TABLET | Freq: Every morning | ORAL | Status: DC
  Administered 2023-08-09 – 2023-08-14 (×6): 100 mg
  Filled 2023-08-08 (×6): qty 1

## 2023-08-08 MED ORDER — ASPIRIN 81 MG PO CHEW
81.0000 mg | CHEWABLE_TABLET | Freq: Every day | ORAL | Status: DC
  Administered 2023-08-09 – 2023-08-10 (×2): 81 mg
  Filled 2023-08-08 (×2): qty 1

## 2023-08-08 MED ORDER — VANCOMYCIN HCL 1500 MG/300ML IV SOLN
1500.0000 mg | Freq: Once | INTRAVENOUS | Status: AC
  Administered 2023-08-08: 1500 mg via INTRAVENOUS
  Filled 2023-08-08: qty 300

## 2023-08-08 MED ORDER — CHLORHEXIDINE GLUCONATE CLOTH 2 % EX PADS
6.0000 | MEDICATED_PAD | Freq: Every day | CUTANEOUS | Status: DC
  Administered 2023-08-09 – 2023-08-15 (×9): 6 via TOPICAL

## 2023-08-08 MED ORDER — FENTANYL CITRATE PF 50 MCG/ML IJ SOSY
50.0000 ug | PREFILLED_SYRINGE | Freq: Once | INTRAMUSCULAR | Status: AC
  Administered 2023-08-08 (×2): 50 ug via INTRAVENOUS

## 2023-08-08 MED ORDER — FOLIC ACID 1 MG PO TABS
1.0000 mg | ORAL_TABLET | Freq: Every day | ORAL | Status: DC
  Administered 2023-08-09 – 2023-08-14 (×6): 1 mg
  Filled 2023-08-08 (×6): qty 1

## 2023-08-08 MED ORDER — ROCURONIUM BROMIDE 10 MG/ML (PF) SYRINGE: 100.0000 mg | PREFILLED_SYRINGE | Freq: Once | INTRAVENOUS | Status: DC

## 2023-08-08 MED ORDER — FENTANYL 2500MCG IN NS 250ML (10MCG/ML) PREMIX INFUSION
25.0000 ug/h | INTRAVENOUS | Status: DC
  Administered 2023-08-08 – 2023-08-13 (×5): 25 ug/h via INTRAVENOUS
  Administered 2023-08-13: 50 ug/h via INTRAVENOUS
  Filled 2023-08-08 (×4): qty 250

## 2023-08-08 MED ORDER — PIPERACILLIN-TAZOBACTAM 3.375 G IVPB
3.3750 g | Freq: Three times a day (TID) | INTRAVENOUS | Status: DC
  Administered 2023-08-08 – 2023-08-15 (×20): 3.375 g via INTRAVENOUS
  Filled 2023-08-08 (×20): qty 50

## 2023-08-08 MED ORDER — ATORVASTATIN CALCIUM 40 MG PO TABS: 40.0000 mg | ORAL_TABLET | Freq: Every day | ORAL | Status: DC

## 2023-08-08 MED ORDER — POLYETHYLENE GLYCOL 3350 17 G PO PACK
17.0000 g | PACK | Freq: Every day | ORAL | Status: DC
Start: 2023-08-08 — End: 2023-08-15
  Administered 2023-08-09 – 2023-08-10 (×2): 17 g
  Filled 2023-08-08 (×3): qty 1

## 2023-08-08 MED ORDER — VITAMIN B-12 1000 MCG PO TABS
1000.0000 ug | ORAL_TABLET | Freq: Every day | ORAL | Status: DC
  Administered 2023-08-09 – 2023-08-14 (×6): 1000 ug
  Filled 2023-08-08 (×6): qty 1

## 2023-08-08 MED ORDER — NOREPINEPHRINE 4 MG/250ML-% IV SOLN
0.0000 ug/min | INTRAVENOUS | Status: DC
  Administered 2023-08-08: 21 ug/min via INTRAVENOUS
  Administered 2023-08-08: 17 ug/min via INTRAVENOUS
  Administered 2023-08-09: 16 ug/min via INTRAVENOUS
  Administered 2023-08-09 (×2): 14 ug/min via INTRAVENOUS
  Administered 2023-08-09: 17 ug/min via INTRAVENOUS
  Administered 2023-08-10 (×6): 19 ug/min via INTRAVENOUS
  Administered 2023-08-12: 5 ug/min via INTRAVENOUS
  Filled 2023-08-08 (×3): qty 250
  Filled 2023-08-08: qty 500
  Filled 2023-08-08 (×2): qty 250
  Filled 2023-08-08: qty 500
  Filled 2023-08-08 (×5): qty 250

## 2023-08-08 MED ORDER — FENTANYL BOLUS VIA INFUSION
25.0000 ug | INTRAVENOUS | Status: DC | PRN
  Administered 2023-08-12: 50 ug via INTRAVENOUS
  Administered 2023-08-12: 25 ug via INTRAVENOUS

## 2023-08-08 MED ORDER — FENTANYL 2500MCG IN NS 250ML (10MCG/ML) PREMIX INFUSION
INTRAVENOUS | Status: AC
  Administered 2023-08-12: 25 ug via INTRAVENOUS
  Filled 2023-08-08: qty 250

## 2023-08-08 MED ORDER — NOREPINEPHRINE 4 MG/250ML-% IV SOLN
2.0000 ug/min | INTRAVENOUS | Status: DC
  Administered 2023-08-08: 2 ug/min via INTRAVENOUS
  Administered 2023-08-08: 14 ug/min via INTRAVENOUS
  Filled 2023-08-08: qty 250

## 2023-08-08 MED ORDER — MIDAZOLAM HCL 2 MG/2ML IJ SOLN
1.0000 mg | Freq: Once | INTRAMUSCULAR | Status: AC
  Administered 2023-08-08 (×2): 1 mg via INTRAVENOUS

## 2023-08-08 MED ORDER — SODIUM CHLORIDE 0.9 % IV SOLN
250.0000 mL | INTRAVENOUS | Status: AC
Start: 2023-08-08 — End: 2023-08-09

## 2023-08-08 MED ORDER — PANTOPRAZOLE SODIUM 40 MG IV SOLR
40.0000 mg | Freq: Every day | INTRAVENOUS | Status: DC
  Administered 2023-08-08 – 2023-08-14 (×7): 40 mg via INTRAVENOUS
  Filled 2023-08-08 (×7): qty 10

## 2023-08-08 MED ORDER — PROPOFOL 1000 MG/100ML IV EMUL
0.0000 ug/kg/min | INTRAVENOUS | Status: DC
  Administered 2023-08-08: 10 ug/kg/min via INTRAVENOUS
  Administered 2023-08-08: 5 ug/kg/min via INTRAVENOUS
  Filled 2023-08-08 (×2): qty 100

## 2023-08-08 MED ORDER — MIRTAZAPINE 15 MG PO TABS
7.5000 mg | ORAL_TABLET | Freq: Every day | ORAL | Status: DC
  Administered 2023-08-08 – 2023-08-09 (×2): 7.5 mg
  Filled 2023-08-08 (×2): qty 1

## 2023-08-08 MED ORDER — VANCOMYCIN HCL 750 MG/150ML IV SOLN
750.0000 mg | Freq: Two times a day (BID) | INTRAVENOUS | Status: DC
  Filled 2023-08-08: qty 150

## 2023-08-08 MED ORDER — LACTATED RINGERS IV SOLN: INTRAVENOUS | Status: AC

## 2023-08-08 MED ORDER — FERROUS SULFATE 300 (60 FE) MG/5ML PO SOLN
300.0000 mg | Freq: Every day | ORAL | Status: DC
  Administered 2023-08-08 – 2023-08-14 (×7): 300 mg
  Filled 2023-08-08 (×8): qty 5

## 2023-08-08 MED ORDER — EZETIMIBE 10 MG PO TABS: 10.0000 mg | ORAL_TABLET | Freq: Every day | ORAL | Status: DC

## 2023-08-08 MED ORDER — SODIUM CHLORIDE 0.9% FLUSH: 10.0000 mL | INTRAVENOUS | Status: DC | PRN

## 2023-08-08 MED ORDER — PROPOFOL 1000 MG/100ML IV EMUL
INTRAVENOUS | Status: AC
  Filled 2023-08-08: qty 100

## 2023-08-08 MED ORDER — ORAL CARE MOUTH RINSE
15.0000 mL | OROMUCOSAL | Status: DC
  Administered 2023-08-08 – 2023-08-15 (×78): 15 mL via OROMUCOSAL

## 2023-08-08 NOTE — Progress Notes (Signed)
 Pharmacy Antibiotic Note  Kenneth [name] is a 73 y.o. male admitted on 08/01/2023 with pneumonia.  Pharmacy has been consulted for Zosyn dosing.  Tmax 100.8, WBC 10.6, aspiration pneumonia per CCM.   Plan: Start Zosyn 3.375 g EI q8h  Follow cultures and length of therapy   Height: 5\' 11"  (180.3 cm) Weight: 86.6 kg (191 lb) IBW/kg (Calculated) : 75.3  Temp (24hrs), Avg:99 F (37.2 C), Min:97.9 F (36.6 C), Max:100.8 F (38.2 C)  Recent Labs  Lab 08/05/23 0457 08/06/23 0018 08/06/23 0509 08/07/23 0243 08/07/23 0601 08/07/23 0740 08/07/23 1403 08/07/23 1613 08/07/23 1901 08/08/23 0635  WBC 9.0 9.8 9.2  --  10.8*  --   --   --   --  10.6*  CREATININE 0.88 0.94 0.96 1.16  --   --   --  1.10  --  1.09  LATICACIDVEN  --   --   --   --  2.2* 2.2* >9.0* 2.1* 2.3*  --     Estimated Creatinine Clearance: 63.3 mL/min (by C-G formula based on SCr of 1.09 mg/dL).    Allergies  Allergen Reactions   Jardiance [Empagliflozin] Other (See Comments)    To the hospital- due to complications   Fluoxetine Other (See Comments)    Prefers to NOT take this   Penicillins Other (See Comments)    Tolerated Unasyn March 2025   Silodosin Other (See Comments)    Vertigo     3/28 bcx- MRSE in 1 bottle, likely contaminant  3/28 MRSA nares neg   Thank you for allowing pharmacy to be a part of this patient's care.  Cedric Fishman, PharmD, BCPS, BCCCP Clinical Pharmacist

## 2023-08-08 NOTE — Consult Note (Signed)
 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   On ED arrival  pt was afebrile, HR:63, BP: 104/47, RR 23. Labs were notable for WBC 7.9, Hgb: 9.0( baseline around 12), Platelets 87, Na: 132, K: 4.1, CO2: 21, Cr, 0.87( Baseline), Transaminases/ ALK Phos WNL, T.bili elevated at 1.9, Mg: 1.4. Troponin : 5--> 4, BNP: 33. FOBT negative, Iron studies consistent with deficiency, B12 low. TSH/T4 WNL. UA grossly normal. RVP negative. CXR no acute, chronic right base changes.  Admitted to [facility]. GI was consulted for anemia in the setting of DAPT with recommendation for BID PPI and EGD to r/o occult GIB. EGD completed 3/23 with mild gastritis, no evidence of active bleeding. Neurology consulted(3/25) for new onset confusion. CT head completed 3/25 with NAICA, new small chronic cortical/ subcortical infarct R frontal lobe ( new from prior), known small chronic infarcts, L frontal/ L parietal/ R temporal lobes. EEG (3/25) showed mild diffuse encephalopathy, no seizures. F/u MRI brain (3/25) showed many small acute infarcts concerning for embolic etiology. MRI C-Spine with severe canal stenosis concerning for edema and compressive myelopathy, severe left/moderate right foraminal stenosis, edema of left T1 inferior articular facet. MRI T-spine negative. Given patients progressive weakness in the setting of known C-Spine compression, NSG consulted for  evaluation 3/26 with recommendation for eventual cord decompression once optimized from a medical standpoint.Additionally, psychiatry was consulted for recommendations on medication management for depressed mood. Cardiology was consulted 3/27 for preoperative cardiac risk stratification.  On 3/28AM, patient was reportedly hot to touch per RN with increased RR and WOB and Tmax 103F. CXR obtained 3/28 stable . WBC slightly elevated to 10.8 , INR 1.4, K 3.3, CO2 21. PCT 1.35 ( 0.65), LA 2.2 with repeat >9.   PCCM consulted for concern for developing sepsis, acute liver injury.  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 CT Head/MRI Brain obtained. MRI C-spine and T-spine completed. 3/26 - NSGY evaluation for ?decompression surgery.  3/27 - Cards consult for preoperative risk stratification for possible cord decompression  surgery, tentatively 3/31. 3/28 - Early AM, febrile to Tmax 103F with increased RR/WOB and ?aspiration; LA 2.2 with mild WBC elevation to 10.8; CXR stable. LA > 9 prompted PCCM consult (aberrant value, repeat 2.1). LFTs elevated. Worsening mental status/drowsiness.  Interim  History / Subjective:  Transferred to ICU for AMS and intubation for airway protection.  Objective:  Blood pressure (!) 113/54, pulse 72, temperature (!) 100.8 F (38.2 C), temperature source Axillary, resp. rate 14, height 5\' 11"  (1.803 m), weight 86.6 kg, SpO2 94%.        Intake/Output Summary (Last 24 hours) at 08/08/2023 0918 Last data filed at 08/08/2023 0608 Gross per 24 hour  Intake 0 ml  Output 1000 ml  Net -1000 ml   Filed Weights   08/01/23 0628  Weight: 86.6 kg   Physical Exam: General: Critically and chronically ill-appearing, obtunded HENT: , AT, OP clear, MMM Eyes: EOMI, no scleral icterus Respiratory: Diminished with scattered rhonchi to auscultation bilaterally.  No crackles, wheezing or rales Cardiovascular: RRR, -M/R/G, no JVD GI: BS+, soft, nontender Extremities:-Edema,-tenderness Neuro: Minimally responsive, opens eye to deep sternal rub but immediately falls asleep, PERRL, does not follow commands  Imaging, labs and test in EMR in the last 24 hours reviewed independently by me. 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 MRI Brain (3/25) showed many small acute infarcts concerning for embolic etiology. TCD bubble study 2/15 with "small, clinically insignificant PFO". Loop recorder negative for arrhythmia/A-Fib - Compound heterozygous state for factor V Leiden mutation and prothrombin gene mutation.   - Failed Plavix and Brilinta Plan: - Intubate for airway protection -  Reconsulted stroke team - STAT MRI brain - Hematology following. Recommend discussion with Neurology for anticoagulation - Neuro checks - Neuroprotective measures: HOB > 30 degrees, normoglycemia, normothermia, electrolytes WNL  Acute hypoxemic respiratory failure secondary to AMS, recurrent strokes, aspiration -Full vent support -LTVV, 4-8cc/kg IBW with goal Pplat<30 and DP<15 -VAP and PAD protocol for RASS goal -1 -ABG and CXR post-intubation  Sepsis secondary aspiration PNA 1 of 4 blood cx bottles growing MRSE, which is typically a contaminant  - Goal MAP > 65 - Trend WBC, fever curve, LA - Change Abx to Zosyn to broaden GN coverage. F/u Cx data  Elevated LFTs - improving today Differential includes embolic concern (PVT versus Budd-Chiari, given history of clotting disorder), DILI, acute hepatitis. CTA Abdomen/Pelvis neg for acute pathology, stable mild left UPJ obstruction. Acute hepatitis panel neg. - F/u US Liver Doppler to check velocities/flows - Trend CMET, INR  Hypercoagulable state due to Factor V Leiden and prothrombin gene mutations Low protein S levels Spleen lesion: CT scan done 08/06/2023 shows enlarging 3.4 cm complex lesion upper pole of spleen  - Followed by Heme/Onc - Flow cytometry - Outpatient PET/CT when able  Cervical spinal stenosis with cord compression S/p prior ACDF (2014) MRI C-spine with severe canal stenosis concerning for edema and compressive myelopathy, severe left/moderate right foraminal stenosis, edema of left T1 inferior articular facet. MRI T-spine negative. - NSGY consulted, following -  Tentative plan for cord decompression pending medical surgical clearance; on hold at this time due to clinical decompensation - PT/OT when clinically appropriate  Anemia, multifactorial in the setting of critical illness, B12 deficiency, iron deficiency - Trend H&H, Plt, INR - Monitor for signs of active bleeding - Transfuse for Hgb < 7.0 or  hemodynamically significant bleeding - B12/folate/thiamine supplementation  T2DM - SSI - CBGs Q4H while NPO - Goal CBG 140-180  History of diverticulosis - Noted history  Best Practice: (right click and "Reselect all SmartList Selections" daily)   Diet/type: NPO DVT prophylaxis: SCDs, SQH GI prophylaxis: PPI Lines: N/A Foley:  N/A Code Status:  full code Last date of multidisciplinary goals of care discussion [3/29] Confirmed code status with wife today  Critical care time: N/A   The patient is critically ill with multiple organ systems failure and requires high complexity decision making for assessment and support, frequent evaluation and titration of therapies, application of advanced monitoring technologies and extensive interpretation of multiple databases.  Independent Critical Care Time: 45 Minutes.   [name], M.D. [facility] Pulmonary/Critical Care Medicine 08/08/2023 9:18 AM   Please see Amion for pager number to reach on-call Pulmonary and Critical Care Team.

## 2023-08-08 NOTE — Progress Notes (Signed)
 PHARMACY - PHYSICIAN COMMUNICATION CRITICAL VALUE ALERT - BLOOD CULTURE IDENTIFICATION (BCID)  Kenneth [name] is an 73 y.o. male who presented to [facility] on 08/01/2023 with a chief complaint of weakness.  Assessment:  Growing MRSE in 1 of 4 blood cx bottles, typically a contaminant, had been started on ABX yesterday for early sepsis.  Name of physician (or Provider) ContactedOretha Ellis MD  Current antibiotics: doxycycline and Unasyn  Changes to prescribed antibiotics recommended:  Given clinical picture, change doxy to vancomycin.  Vancomycin 1500mg  x1 then 1250mg  IV Q12H. Goal AUC 400-550.  Expected AUC 425.  Results for orders placed or performed during the hospital encounter of 08/01/23  Blood Culture ID Panel (Reflexed) (Collected: 08/07/2023  2:43 AM)  Result Value Ref Range   Enterococcus faecalis NOT DETECTED NOT DETECTED   Enterococcus Faecium NOT DETECTED NOT DETECTED   Listeria monocytogenes NOT DETECTED NOT DETECTED   Staphylococcus species DETECTED (A) NOT DETECTED   Staphylococcus aureus (BCID) NOT DETECTED NOT DETECTED   Staphylococcus epidermidis DETECTED (A) NOT DETECTED   Staphylococcus lugdunensis NOT DETECTED NOT DETECTED   Streptococcus species NOT DETECTED NOT DETECTED   Streptococcus agalactiae NOT DETECTED NOT DETECTED   Streptococcus pneumoniae NOT DETECTED NOT DETECTED   Streptococcus pyogenes NOT DETECTED NOT DETECTED   A.calcoaceticus-baumannii NOT DETECTED NOT DETECTED   Bacteroides fragilis NOT DETECTED NOT DETECTED   Enterobacterales NOT DETECTED NOT DETECTED   Enterobacter cloacae complex NOT DETECTED NOT DETECTED   Escherichia coli NOT DETECTED NOT DETECTED   Klebsiella aerogenes NOT DETECTED NOT DETECTED   Klebsiella oxytoca NOT DETECTED NOT DETECTED   Klebsiella pneumoniae NOT DETECTED NOT DETECTED   Proteus species NOT DETECTED NOT DETECTED   Salmonella species NOT DETECTED NOT DETECTED   Serratia marcescens NOT DETECTED NOT DETECTED    Haemophilus influenzae NOT DETECTED NOT DETECTED   Neisseria meningitidis NOT DETECTED NOT DETECTED   Pseudomonas aeruginosa NOT DETECTED NOT DETECTED   Stenotrophomonas maltophilia NOT DETECTED NOT DETECTED   Candida albicans NOT DETECTED NOT DETECTED   Candida auris NOT DETECTED NOT DETECTED   Candida glabrata NOT DETECTED NOT DETECTED   Candida krusei NOT DETECTED NOT DETECTED   Candida parapsilosis NOT DETECTED NOT DETECTED   Candida tropicalis NOT DETECTED NOT DETECTED   Cryptococcus neoformans/gattii NOT DETECTED NOT DETECTED   Methicillin resistance mecA/C DETECTED (A) NOT DETECTED    Vernard Gambles, PharmD, BCPS  08/08/2023  12:45 AM

## 2023-08-08 NOTE — Progress Notes (Signed)
 Aspiration pneumonia MRSE bacteremia Received a call from pharmacy. 1 of 4 blood cx bottles growing MRSE, which is typically a contaminant and likely is here BUT he was started on Unasyn and doxy for early sepsis. The doxy kind of covers MRSE if it does represent an infectious process, but vanc would be better--per pharmacy recommendation.  -After discussion with pharmacy plan to continue IV Unasyn for management of aspiration pneumonia. - Will discontinue doxycycline and will start IV vancomycin for MRSE bacteremia.  Tereasa Coop, MD [facility] 08/08/2023, 12:10 AM

## 2023-08-08 NOTE — Consult Note (Addendum)
 [facility] Psychiatric Consult Follow-up  Patient Name: .Kenneth [name]  MRN: [id number]  DOB: [birth year]  Consult Order details:  Orders (From admission, onward)     Start     Ordered   08/04/23 0855  IP CONSULT TO PSYCHIATRY       Ordering Provider: Leroy Sea, MD  Provider:  (Not yet assigned)  Question Answer Comment  Location [facility]   Reason for Consult? Flat affect, poor appetite, generalized deconditioning, per family worsening depression.      08/04/23 0855             Mode of Visit: In person    Psychiatry Consult Evaluation  Service Date: August 08, 2023 LOS:  LOS: 7 days  Chief Complaint Altered Mental Status  Subjective: Patient sedated and intubated  Primary Psychiatric Diagnoses  Altered mental status 2.  Depression  Assessment  Kenneth [name] is a 73 y.o. male admitted: Medically on 08/01/2023  6:19 AM for  generalized weakness in a patient with gradually progressive anemia of chronic disease, dehydration, hypomagnesemia.  He has no documented past psychiatric history, but prescribed Lexapro as home medication presumably for depression.  He has a past medical history of  stroke, currently on DAPT, dyslipidemia, BPH, prostate cancer, insulin-dependent DM type II, hypertension, cervical stenosis, history of iron deficiency anemia, history of OSA does not wear CPAP, recent history of right ankle strain 2 weeks ago, presents to the hospital for 2-3 history of gradually progressive weakness, was also not eating or drinking well for a few days.   08/04/2023: Met with resident covering neurology service who indicated patient has been diagnosed with a B12 deficiency that may be contributing to his current mental state. His initial presentation of altered mental status is most consistent with her underlying medical condition. He meets criteria for depression based on poor appetite, decreased interest in usual activities, sleeping more than  usual, low energy, poor concentration.  Current outpatient psychotropic medications include Lexapro and historically he has had a minimal response to these medications. He was compliant with medications prior to admission as evidenced by patient and wife report. On initial examination, patient is drowsy during assessment.  He will become alert to response, but is disoriented to time.  He provides permission to contact his wife, but is unable to provide her name or phone number.  Patient states that Lexapro has not seem to help his depressed mood.  He is open to a change in antidepressant and reviewed mirtazapine which can help improve sleep and appetite.  He is agreeable, but would like me to review this with his wife.  Discussed patient diagnoses of problem list of obstructive sleep apnea.  Patient states he has not ever used CPAP, but is open to trial of CPAP hospitalized. Patient specifically denies any SI, HI, AVH.  08/05/2023: Patient is seen mid morning.  He is laying in bed, asleep with covers off.  He arouses easily and states that he has been hot.  He describes that he slept well overnight and continues to feel tired.  He believes his appetite is slightly improved, and reports his mood as "okay".  He says it has been over a year since he has experienced a good mood.  Today, patient is oriented to name, but not date.  He continues to have confusion regarding need for hospitalization.  He is however able to recall visitors yesterday and today knows the name of his wife and caregiver and that  they visited yesterday.  He is able to stay more alert during interview today, and is more conversant. Patient specifically denies any SI, HI, AVH.  08/06/2023:  Attempted to see patient this morning, however he with CT scanner for chest and abdominal CT.  Upon recheck, he is in his room having just completed echocardiogram.  He is fatigued, but smiles and recognizes this Clinical research associate.  His lunch tray has arrived, and when  shown the food and asked if he wanted to eat, he said yes.  He is weak, but able to feed himself.  He states that he slept well and his appetite is improving.  Interestingly, he has not yet received a dose of mirtazapine 7.5 mg at bedtime despite orders placed.  Patient too fatigued to fully participate in assessment.  Collateral call to wife, Kerin Ransom (phone [phone]): When she came to see him today, he had spilled food on himself.  After she arrived, he ate 1/2 a cookie, able to eat some soup and sandwich. Wife states that she feels patient seems worse today than yesterday.  She recalls personal experience with mirtazapine, and states concerned that it may not be effective for patient.  She is agreeable to a trial of a more activating antidepressant.  Wife states that other than Lexapro, patient has been not been on other antidepressants historically.  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    08/08/2023: Patient moved to ICU this morning prior to being seen.  Visit was completed in the ICU.  Patient is sedated and intubated.  Collateral from wife, [name] at bedside notes that his depression remains stable yesterday. Reviewed with wife importance of maintaining day/night cues for patient while he is in the ICU to help decrease chances of delirium.  Please see plan below for detailed recommendations.   Diagnoses:  Active Hospital problems: Principal Problem:   Symptomatic anemia Active Problems:   Weakness   Thrombocytopenia (HCC)   History of stroke   Preop cardiovascular exam   Hypercoagulable state (HCC)   Protein-calorie malnutrition, severe   Splenic lesion   Anemia    Plan   ## Psychiatric Medication Recommendations:  -- Mirtazapine 7.5 mg for depressed mood with side effects of sedation and increasing appetite-  patient has not yet received a dose. --Venlafaxine 37.5 mg not restarted by critical care team.  Will not  restart at this time. -- Nutritional supplementation as recommended by neurology and primary team  ## Medical Decision Making Capacity:  Not specifically addressed at this encounter.  Patient does not have full orientation at time of this interview.  Wife  is able to assist with medical decision making.  ## Further Work-up:  -- EEG 08/04/2023:  mild diffuse encephalopathy. No seizures or epileptiform discharges were seen throughout the recording.  -- Other labs per primary team and neurology. -- Most recent EKG on 08/01/2023 had QtC of 469 -- Pertinent labwork reviewed earlier this admission includes: Mild hyponatremia, elevated blood sugars, normal magnesium, vitamin D and ammonia levels, normal BNP, TSH/T4 within normal limits, CBC with anemia.  Elevated ferritin, normal reticulocyte count, low iron, TIBC and iron saturation ratios, normal folate low B12 (142) RPR nonreactive B1, B6 pending  -- CT Abdomen, Chest completed 08/06/2023: pending  -- ECHO cardiogram 08/06/2023: Left Ventricle: Left ventricular ejection fraction, by estimation, is 60 to 65% . The left ventricle has normal function. The left ventricle has no regional wall motion abnormalities. Definity  contrast agent was given IV to delineate the left ventricular endocardial borders. The left ventricular internal cavity size was normal in size. There is no left ventricular hypertrophy. Left ventricular diastolic parameters are consistent with Grade I diastolic dysfunction ( impaired relaxation) . Right Ventricle: The right ventricular size is normal. No increase in right ventricular wall thickness. Right ventricular systolic function is normal. Tricuspid regurgitation signal is inadequate for assessing PA pressure. Left Atrium: Left atrial size was normal in size. Right Atrium: Right atrial size was normal in size. Pericardium: There is no evidence of pericardial effusion. Mitral Valve: The mitral valve was not well visualized. No  evidence of mitral valve regurgitation. No evidence of mitral valve stenosis. Tricuspid Valve: The tricuspid valve is not well visualized. Tricuspid valve regurgitation is not demonstrated. No evidence of tricuspid stenosis. Aortic Valve: The aortic valve was not well visualized. There is mild calcification of the aortic valve. Aortic valve regurgitation is not visualized. Aortic valve sclerosis is present, with no evidence of aortic valve stenosis. Aortic valve mean gradient measures 9. 0 mmHg. Aortic valve peak gradient measures 19. 2 mmHg. Aortic valve area, by VTI measures 2. 56 cm . Pulmonic Valve: The pulmonic valve was normal in structure. Pulmonic valve regurgitation is not visualized. No evidence of pulmonic stenosis. Aorta: The aortic root is normal in size and structure. Venous: The inferior vena cava is normal in size with greater than 50% respiratory variability, suggesting right atrial pressure of 3 mmHg. IAS/ Shunts: No atrial level shunt detected by color flow Doppler.  -- Patient would benefit from workup for obstructive sleep apnea and treatment as indicated, as untreated OSA can contribute to increase metabolic, cardiovascular, memory and mood symptoms/complications.  08/04/2023 brain MRI: Many small acute infarcts in the bilateral frontal and parietal lobes, left thalamus, right occipital lobe, and cerebellum. Given involvement of multiple vascular territories, consider an embolic etiology.    ## Disposition:-- There are no psychiatric contraindications to discharge at this time  ## Behavioral / Environmental: -Delirium Precautions: Delirium Interventions for Nursing and Staff: - RN to open blinds every AM. - To Bedside: Glasses, hearing aide, and pt's own shoes. Make available to patients. when possible and encourage use. - Encourage po fluids when appropriate, keep fluids within reach. - OOB to chair with meals. - Passive ROM exercises to all extremities with AM & PM care. -  RN to assess orientation to person, time and place QAM and PRN. - Recommend extended visitation hours with familiar family/friends as feasible. - Staff to minimize disturbances at night. Turn off television when pt asleep or when not in use.    ## Safety and Observation Level:  - Based on my clinical evaluation, I estimate the patient to be at low risk of self harm in the current setting. - At this time, we recommend  routine. This decision is based on my review of the chart including patient's history and current presentation, interview of the patient, mental status examination, and consideration of suicide risk including evaluating suicidal ideation, plan, intent, suicidal or self-harm behaviors, risk factors, and protective factors. This judgment is based on our ability to directly address suicide risk, implement suicide prevention strategies, and develop a safety plan while the patient is in the clinical setting. Please contact our team if there is a concern that risk level has changed.  CSSR Risk Category:C-SSRS RISK CATEGORY: No Risk  Suicide Risk Assessment: Patient has following modifiable risk factors for suicide: under treated depression , which we  are addressing by medication management. Patient has following non-modifiable or demographic risk factors for suicide: male gender Patient has the following protective factors against suicide: Supportive family, Supportive friends, Cultural, spiritual, or religious beliefs that discourage suicide, no history of suicide attempts, and no history of NSSIB  Psychiatry will sign off at this time.  Please re-consult as needed once patient is awake and alert and able to participate in ongoing psychiatric assessment.   Mariel Craft, MD       History of Present Illness  Relevant Aspects of  Hospital Course:  Admitted on 08/01/2023 for altered mental status.   Collateral information:  Collateral obtained from wife and caregiver at bedside at  time of reassessment in the afternoon on 08/04/2023: Patient was more alert, and patient and wife described him as improved from yesterday and more talkative. Wife states that patient had been becoming depressed for some time prior to admission.  She states he ultimately ended up on Mounjaro and Toujeo for management of his diabetes.  Wife states that he did have significant weight loss, but she feels like it is "short circuited his brain".  She states that after starting [facility] patient became more forgetful and depressed.  Patient has since discontinued Mounjaro and his Toujeo dose has been decreased in half. She states that he had always been a picky eater, but after starting Mounjaro, he has lost his appetite and not regained it since discontinuation.   Patient Report:  Patient intubated and sedated in the ICU.  Psych ROS:  Depression: Yes Anxiety: Yes Mania (lifetime and current): None Psychosis: (lifetime and current): None  Review of Systems  Unable to perform ROS: Acuity of condition     Psychiatric and Social History  Psychiatric History:  Information collected from patient and wife  Prev Dx/Sx: Depression Current Psych Provider: None Home Meds (current): Lexapro Previous Med Trials: Lexapro Therapy: None  Prior Psych Hospitalization: None Prior Self Harm: None Prior Violence: None  Family Psych History: None Family Hx suicide: None  Social History:  Developmental Hx: Not obtained Educational Hx: Not obtained Occupational Hx: Retired Armed forces operational officer Hx: None Living Situation: Lives with wife and caregiver Spiritual Hx: Not asked Access to weapons/lethal means: Denies   Social History   Socioeconomic History   Marital status: Married    Spouse name: Not on file   Number of children: Not on file   Years of education: Not on file   Highest education level: Not on file  Occupational History   Not on file  Tobacco Use   Smoking status: Former    Current packs/day:  0.00    Average packs/day: 0.3 packs/day for 2.0 years (0.5 ttl pk-yrs)    Types: Cigarettes    Start date: 09/23/1967    Quit date: 09/22/1969    Years since quitting: 53.9   Smokeless tobacco: Never  Vaping Use   Vaping status: Never Used  Substance and Sexual Activity   Alcohol use: No   Drug use: No   Sexual activity: Yes  Other Topics Concern   Not on file  Social History Narrative   Right handed    Wear glasses   Social Drivers of Health   Financial Resource Strain: Not on file  Food Insecurity: No Food Insecurity (08/01/2023)   Hunger Vital Sign    Worried About Running Out of Food in the Last Year: Never true    Ran Out of Food in the Last Year: Never true  Transportation Needs: No  Transportation Needs (08/01/2023)   PRAPARE - Administrator, Civil Service (Medical): No    Lack of Transportation (Non-Medical): No  Physical Activity: Not on file  Stress: Not on file  Social Connections: Socially Isolated (08/01/2023)   Social Connection and Isolation Panel [NHANES]    Frequency of Communication with Friends and Family: Never    Frequency of Social Gatherings with Friends and Family: Never    Attends Religious Services: Never    Database administrator or Organizations: No    Attends Banker Meetings: Never    Marital Status: Married  Catering manager Violence: Not At Risk (08/01/2023)   Humiliation, Afraid, Rape, and Kick questionnaire    Fear of Current or Ex-Partner: No    Emotionally Abused: No    Physically Abused: No    Sexually Abused: No    Substance History Alcohol: None Type of alcohol N/A Last Drink N/A Number of drinks per day: N/A History of alcohol withdrawal seizures in N/A History of DT's in  Tobacco: Former Illicit drugs: N/A Prescription drug abuse: N/A Rehab hx: N/A  Exam Findings  Physical Exam:  Vital Signs:  Temp:  [97.9 F (36.6 C)-100.8 F (38.2 C)] 100.8 F (38.2 C) (03/29 0905) Pulse Rate:  [69-102] 76  (03/29 1150) Resp:  [12-32] 18 (03/29 1150) BP: (67-126)/(43-64) 118/48 (03/29 1150) SpO2:  [93 %-100 %] 100 % (03/29 1150) FiO2 (%):  [40 %-100 %] 40 % (03/29 1150) Blood pressure (!) 118/48, pulse 76, temperature (!) 100.8 F (38.2 C), temperature source Axillary, resp. rate 18, height 5' 10.98" (1.803 m), weight 86.6 kg, SpO2 100%. Body mass index is 26.65 kg/m.  Physical Exam  Mental Status Exam: General Appearance: Disheveled  Orientation:  Negative, on ventilator  Memory:  NA on ventilator  Concentration:  Concentration: NA  Recall:   on ventilator  Attention  Other: on ventilator  Eye Contact:  None  Speech:   on ventilator  Language:   on ventilator  Volume:   on ventilator  Mood: n/a  Affect:  Negative  Thought Process:  NA  Thought Content:  NA  Suicidal Thoughts:   UTA  Homicidal Thoughts:   UTA  Judgement:  NA  Insight:  NA and Shallow  Psychomotor Activity:  Decreased  Akathisia:  NA  Fund of Knowledge:  NA      Assets:  Resilience Social Support  Cognition:  Impaired,  Severe  ADL's:  Impaired  AIMS (if indicated):        Other History   These have been pulled in through the EMR, reviewed, and updated if appropriate.  Family History:  The patient's family history includes Diabetes in his mother.  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   Hyperlipidemia    Hypertension    Iron deficiency anemia, unspecified    OSA (obstructive sleep apnea)    refusing CPAP   Pneumonia 03/2022   w/effusion   Prostate cancer (HCC)    Protein S deficiency (HCC)    Stroke [facility])     Surgical History: Past Surgical History:  Procedure Laterality Date   ANTERIOR CERVICAL DECOMP/DISCECTOMY FUSION N/A 10/01/2012   Procedure: ANTERIOR CERVICAL DECOMPRESSION/DISCECTOMY FUSION 2 LEVELS;  Surgeon: Tia Alert, MD;  Location: [facility] NEURO ORS;  Service: Neurosurgery;  Laterality: N/A;  Cervical five-six,Cervical six-seven   COLONOSCOPY  09/30/2010, 03/01/2014   diverticulosis, small internal hemorrhoids   COLONOSCOPY W/ POLYPECTOMY  04/03/2010   4 polyps 8-63mm, worst TV adenoma with high-grade dysplasia   ESOPHAGOGASTRODUODENOSCOPY N/A 08/02/2023   Procedure: EGD (ESOPHAGOGASTRODUODENOSCOPY);  Surgeon: [name], MD;  Location: [facility] ENDOSCOPY;  Service: Gastroenterology;  Laterality: N/A;   LOOP RECORDER INSERTION N/A 10/10/2022   Procedure: LOOP RECORDER INSERTION;  Surgeon: Marinus Maw, MD;  Location: [facility] INVASIVE CV LAB;  Service: Cardiovascular;  Laterality: N/A;   NO PAST SURGERIES     POLYPECTOMY     POSTERIOR CERVICAL FUSION/FORAMINOTOMY Left 10/20/2012   Procedure: C/5-6,C/6-7 Lami/Multi level,POSTERIOR CERVICAL FUSION/FORAMINOTOMY C/4-7;  Surgeon: Tia Alert, MD;  Location: [facility] NEURO ORS;  Service: Neurosurgery;  Laterality: Left;  Cervical Five-Six, Cervical Six-Seven Laminectomies, Posterior Cervical Fusion/Foraminotomies Cervical Four through Seven.    TONSILLECTOMY       Medications:   Current Facility-Administered Medications:    0.9 %  sodium chloride infusion, 250 mL, Intravenous, Continuous, Luciano Cutter, MD   acetaminophen (TYLENOL) tablet 650 mg, 650 mg, Oral, Q6H PRN, 650 mg at 08/05/23 0950 **OR** acetaminophen (TYLENOL) suppository 650 mg, 650 mg, Rectal, Q6H PRN, Leroy Sea, MD, 650 mg at 08/07/23 0048   albuterol (PROVENTIL) (2.5 MG/3ML) 0.083% nebulizer solution 2.5 mg, 2.5 mg, Nebulization, Q6H PRN, Leroy Sea, MD   [START ON 08/09/2023] aspirin chewable tablet 81 mg, 81 mg, Per Tube, Daily, Luciano Cutter, MD   atorvastatin (LIPITOR) tablet 40 mg, 40 mg, Per  Tube, QHS, Luciano Cutter, MD   bisacodyl (DULCOLAX) EC tablet 5 mg, 5 mg, Oral, Daily PRN, Leroy Sea, MD   [START ON 08/09/2023] Chlorhexidine Gluconate Cloth 2 % PADS 6 each, 6 each, Topical, Q0600, Luciano Cutter, MD   [START ON 08/09/2023] cyanocobalamin (VITAMIN B12) tablet 1,000 mcg, 1,000 mcg, Per Tube, Daily, Luciano Cutter, MD   docusate (COLACE) 50 MG/5ML liquid 100 mg, 100 mg, Per Tube, BID, Luciano Cutter, MD   EPINEPHrine (EPI-PEN) injection 0.3 mg, 0.3 mg, Intramuscular, Once PRN, Reome, Earle J, RPH   [START ON 08/09/2023] ezetimibe (ZETIA) tablet 10 mg, 10 mg, Per Tube, Daily, Luciano Cutter, MD   fentaNYL (SUBLIMAZE) bolus via infusion 25-100 mcg, 25-100 mcg, Intravenous, Q15 min PRN, Luciano Cutter, MD   fentaNYL (SUBLIMAZE) injection 25 mcg, 25 mcg, Intravenous, Once, Luciano Cutter, MD   fentaNYL 10 mcg/ml infusion, , , ,    fentaNYL in NS (36mcg/ml) infusion-PREMIX, 25-200 mcg/hr, Intravenous, Continuous, Luciano Cutter, MD, Last Rate: 2.5 mL/hr at 08/08/23 1100, 25 mcg/hr at 08/08/23 1100   ferrous sulfate 300 (60 Fe) MG/5ML syrup 300 mg, 300 mg, Per Tube, Daily, Luciano Cutter, MD   Melene Muller ON 08/09/2023] folic acid (FOLVITE) tablet 1 mg, 1 mg, Per Tube, Daily, Luciano Cutter, MD   Gerhardt's butt cream, , Topical, BID, Leroy Sea, MD, Given at 08/07/23 2221   heparin injection 5,000 Units, 5,000 Units, Subcutaneous, Q8H, Leroy Sea, MD, 5,000 Units at 08/08/23 0608   insulin aspart (novoLOG) injection 0-15 Units, 0-15 Units, Subcutaneous, Q4H, Luciano Cutter, MD   insulin glargine-yfgn [facility]) injection 30 Units, 30 Units, Subcutaneous, Q1400, Thedore Mins,  Stanford Scotland, MD, 30 Units at 08/06/23 1323   lactated ringers infusion, , Intravenous, Continuous, Leroy Sea, MD, Last Rate: 100 mL/hr at 08/08/23 1100, Infusion Verify at 08/08/23 1100   menthol-cetylpyridinium (CEPACOL) lozenge 3 mg, 1 lozenge, Oral, PRN,  Howerter, Justin B, DO   mirtazapine (REMERON) tablet 7.5 mg, 7.5 mg, Per Tube, QHS, Luciano Cutter, MD   norepinephrine (LEVOPHED) 4mg  in (0.016 mg/mL) premix infusion, 2-10 mcg/min, Intravenous, Titrated, Luciano Cutter, MD, Last Rate: 52.5 mL/hr at 08/08/23 1100, 14 mcg/min at 08/08/23 1100   [DISCONTINUED] ondansetron (ZOFRAN) tablet 4 mg, 4 mg, Oral, Q6H PRN **OR** ondansetron (ZOFRAN) injection 4 mg, 4 mg, Intravenous, Q6H PRN, Leroy Sea, MD   Oral care mouth rinse, 15 mL, Mouth Rinse, Q2H, Luciano Cutter, MD   Oral care mouth rinse, 15 mL, Mouth Rinse, PRN, Luciano Cutter, MD   pantoprazole (PROTONIX) injection 40 mg, 40 mg, Intravenous, QHS, Luciano Cutter, MD   phenol [facility]) mouth spray 1 spray, 1 spray, Mouth/Throat, PRN, Howerter, Justin B, DO, 1 spray at 08/04/23 2123   piperacillin-tazobactam (ZOSYN) IVPB 3.375 g, 3.375 g, Intravenous, Q8H, Singh, Prashant K, MD   polyethylene glycol (MIRALAX / GLYCOLAX) packet 17 g, 17 g, Per Tube, Daily, Luciano Cutter, MD   propofol (DIPRIVAN) 1000 MG/100ML infusion, 0-50 mcg/kg/min, Intravenous, Continuous, Luciano Cutter, MD, Last Rate: 5.2 mL/hr at 08/08/23 1100, 10 mcg/kg/min at 08/08/23 1100   propofol (DIPRIVAN) 1000 MG/100ML infusion, , , ,    rocuronium (ZEMURON) injection 100 mg, 100 mg, Intravenous, Once, Luciano Cutter, MD   Melene Muller ON 08/09/2023] thiamine (VITAMIN B1) tablet 100 mg, 100 mg, Per Tube, q AM, Luciano Cutter, MD  Allergies: Allergies  Allergen Reactions   Jardiance [Empagliflozin] Other (See Comments)    To the hospital- due to complications   Fluoxetine Other (See Comments)    Prefers to NOT take this   Penicillins Other (See Comments)    Tolerated Unasyn March 2025   Silodosin Other (See Comments)    Vertigo     Mariel Craft, MD   Total Time Spent in Direct Patient Care:  I personally spent 20 minutes on the unit in direct patient care. The direct patient  care time included face-to-face time with the patient, reviewing the patient's chart, communicating with other professionals, and coordinating care. Greater than 50% of this time was spent in counseling or coordinating care with the patient regarding goals of hospitalization, psycho-education, and discharge planning needs.

## 2023-08-08 NOTE — Progress Notes (Signed)
 STROKE TEAM PROGRESS NOTE   INTERIM HISTORY/SUBJECTIVE No family is not  at the bedside.  Patient is intubated and transferred to [facility]N for sepsis, acute liver injury and respiratory failure.    OBJECTIVE  CBC    Component Value Date/Time   WBC 10.6 (H) 08/08/2023 0635   RBC 2.73 (L) 08/08/2023 0635   HGB 7.8 (L) 08/08/2023 1152   HGB 12.3 (L) 05/01/2023 1204   HCT 23.0 (L) 08/08/2023 1152   PLT 89 (L) 08/08/2023 0635   PLT 249 05/01/2023 1204   MCV 86.4 08/08/2023 0635   MCH 29.7 08/08/2023 0635   MCHC 34.3 08/08/2023 0635   RDW 15.8 (H) 08/08/2023 0635   LYMPHSABS 1.1 08/08/2023 0635   MONOABS 2.2 (H) 08/08/2023 0635   EOSABS 0.0 08/08/2023 0635   BASOSABS 0.0 08/08/2023 0635    BMET    Component Value Date/Time   NA 138 08/08/2023 1152   NA 142 04/06/2017 1057   K 4.2 08/08/2023 1152   CL 105 08/08/2023 0635   CO2 21 (L) 08/08/2023 0635   GLUCOSE 124 (H) 08/08/2023 0635   BUN 32 (H) 08/08/2023 0635   BUN 13 04/06/2017 1057   CREATININE 1.09 08/08/2023 0635   CREATININE 0.94 05/01/2023 1204   CALCIUM 7.9 (L) 08/08/2023 0635   GFRNONAA >60 08/08/2023 0635   GFRNONAA >60 05/01/2023 1204    IMAGING past 24 hours DG Abd 1 View Result Date: 08/08/2023 CLINICAL DATA:  Orogastric tube placement. EXAM: ABDOMEN - 1 VIEW COMPARISON:  None Available. FINDINGS: The bowel gas pattern is normal. Distal tip of nasogastric tube is seen in expected position of proximal stomach. No radio-opaque calculi or other significant radiographic abnormality are seen. IMPRESSION: Distal tip of nasogastric tube is seen in expected position of proximal stomach. Electronically Signed   By: Lupita Raider M.D.   On: 08/08/2023 11:51   DG Chest Port 1 View Result Date: 08/08/2023 CLINICAL DATA:  Intubation. EXAM: PORTABLE CHEST 1 VIEW COMPARISON:  Same day. FINDINGS: Endotracheal and nasogastric tubes appear to be in grossly good position. Stable right-sided pulmonary findings compared to prior  exam. IMPRESSION: Endotracheal and nasogastric tubes appear to be in grossly good position. Electronically Signed   By: Lupita Raider M.D.   On: 08/08/2023 11:50   [place] EKG SITE RITE Result Date: 08/08/2023 If Site Rite image not attached, placement could not be confirmed due to current cardiac rhythm.  DG Chest Port 1 View Result Date: 08/08/2023 CLINICAL DATA:  Shortness of breath. EXAM: PORTABLE CHEST 1 VIEW COMPARISON:  08/07/2023 FINDINGS: Stable cardiomediastinal contours. Loop recorder, unchanged. Persistent right pleural effusion with overlying pleuroparenchymal scarring. Left lung appears clear. No new findings. The visualized osseous structures appear intact. Postoperative changes noted in the cervical spine. IMPRESSION: Persistent right pleural effusion with overlying pleuroparenchymal scarring. Electronically Signed   By: Signa Kell M.D.   On: 08/08/2023 07:56   CT Angio Abd/Pel w/ and/or w/o Result Date: 08/07/2023 CLINICAL DATA:  Hepatic insufficiency EXAM: CT ANGIOGRAPHY ABDOMEN AND PELVIS WITH CONTRAST AND WITHOUT CONTRAST TECHNIQUE: Multidetector CT imaging of the abdomen and pelvis was performed using the liver protocol during bolus administration of intravenous contrast. Because of an IV malfunction, a repeat bolus was required to obtain arterial phase images. Multiplanar reconstructed images and MIPs were obtained and reviewed to evaluate the vascular anatomy. RADIATION DOSE REDUCTION: This exam was performed according to the departmental dose-optimization program which includes automated exposure control, adjustment of the mA and/or kV  according to patient size and/or use of iterative reconstruction technique. CONTRAST:  OMNIPAQUE IOHEXOL 350 MG/ML SOLN COMPARISON:  None Available. FINDINGS: VASCULAR Aorta: Normal caliber aorta without aneurysm, dissection, vasculitis or significant stenosis. Moderate atherosclerotic calcification Celiac: Common origin of the celiac axis and  superior mesenteric artery. Wide patency. No aneurysm or dissection. SMA: Common origin of the superior mesenteric artery and celiac axis. Wide patency. No aneurysm or dissection. 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 Right renal artery is widely patent. Normal vascular morphology. No aneurysm or dissection. Accessory lower pole renal arteries noted bilaterally. IMA: Patent without evidence of aneurysm, dissection, vasculitis or significant stenosis. Inflow: Patent without evidence of aneurysm, dissection, vasculitis or significant stenosis. Proximal Outflow: Bilateral common femoral and visualized portions of the superficial and profunda femoral arteries are patent without evidence of aneurysm, dissection, vasculitis or significant stenosis. Veins: Unremarkable.  Portal and hepatic veins are patent. Review of the MIP images confirms the above findings. 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 Mild cardiomegaly. Hepatobiliary: Cholelithiasis without superimposed pericholecystic inflammatory change. Liver unremarkable; no enhancing intrahepatic mass identified. No intra or extrahepatic biliary ductal dilation. Pancreas: Unremarkable Spleen: The previously noted hypodense lesion within the upper pole of the spleen demonstrates progressive enhancement on delayed phase images but remains hypoenhancing in relation to adjacent splenic parenchyma and differential considerations include low-grade lymphoma or atypical hamartoma. Adrenals/Urinary Tract: Bilateral adrenal nodules are again identified and are indeterminate due to technical artifact related repeat bolus imaging. These are better assessed on prior examination 08/06/2023. 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 The kidneys are otherwise unremarkable save for a simple cortical cyst within the upper  pole the right kidney for which no follow-up imaging is recommended. The bladder is decompressed. Stomach/Bowel: Stomach is within normal limits. Appendix appears normal. No evidence of bowel wall thickening, distention, or inflammatory changes. Trace ascites no free intraperitoneal gas. Lymphatic: No pathologic adenopathy within the abdomen and pelvis. Mild retroperitoneal edema again noted. Reproductive: Mild prostatic hypertrophy. Other: Mild diffuse subcutaneous body wall edema. Small bilateral fat containing inguinal hernias. Musculoskeletal: No acute bone abnormality. No lytic or blastic lesion. IMPRESSION: 1. No acute intra-abdominal pathology identified. No definite radiographic explanation for the patient's reported symptoms. 2. 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 3. Patency of the hepatic arterial and venous structures. 4. Cholelithiasis. 5. Stable mild left UPJ obstruction. 6. Bilateral adrenal nodules are again identified and are indeterminate due to technical artifact related repeat bolus imaging. These are better assessed on prior examination 08/06/2023. Dedicated contrast enhanced MRI examination is recommended. 7. 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 8. Mild cardiomegaly. 9. The previously noted hypodense lesion within the upper pole of the spleen demonstrates progressive enhancement on delayed phase images but remains hypoenhancing in relation to adjacent splenic parenchyma and differential considerations include low-grade lymphoma or atypical hamartoma. This could be simultaneously assessed with contrast enhanced MRI examination. Aortic Atherosclerosis (ICD10-I70.0). Electronically Signed   By: Helyn Numbers M.D.   On: 08/07/2023 22:01    Vitals:   08/08/23 1124 08/08/23 1127 08/08/23 1147 08/08/23 1150  BP: (!) 108/52 (!) 110/49 (!) 111/46 (!) 118/48  Pulse:  78 73 76  Resp: 18 18 18 18   Temp:       TempSrc:  SpO2:  100% 100% 100%  Weight:      Height:         PHYSICAL EXAM  Temp:  [98.2 F (36.8 C)-100.8 F (38.2 C)] 100.8 F (38.2 C) (03/29 0905) Pulse Rate:  [69-102] 76 (03/29 1150) Resp:  [12-32] 18 (03/29 1150) BP: (67-126)/(43-64) 118/48 (03/29 1150) SpO2:  [93 %-100 %] 100 % (03/29 1150) FiO2 (%):  [40 %-100 %] 40 % (03/29 1150)  General - Well nourished, well developed, intubated on sedation.  Ophthalmologic - fundi not visualized due to noncooperation.  Cardiovascular - Regular rate and rhythm.  Neuro - intubated on propofol and fentanyl, eyes closed, not following commands. With forced eye opening, eyes in need position, not blinking to visual threat, not tracking, pupils equal size light reflexes sluggish. Corneal reflex present, gag and cough present. Breathing over the vent.  Facial symmetry not able to test due to ET tube.  Tongue protrusion not cooperative. On pain stimulation, no movement in bilateral upper extremities, withdraws lower extremities but right more than left. Sensation, coordination and gait not tested.    ASSESSMENT/PLAN  [name] is a 73 y.o. male with history of prior history of cryptogenic strokes, diabetes, hyperlipidemia, hypertension, iron deficiency anemia, OSA, prostate cancer admitted for gradually progressive weakness and poor nutrition intake.    Stroke:  b/l small bilateral multifocal embolic infarcts, etiology: possible from primary hypercoagulable state CT head No acute abnormality.  MRI  Many small acute infarcts in the bilateral frontal and parietal lobes, left thalamus, right occipital lobe, and cerebellum MRA pending MRI repeat pending 2D Echo EF 60-65%.  LA size normal. CT chest abdomen pelvis no evidence of malignancy  Loop recorder interrogation negative for paroxysmal A-fib  LDL 47 HgbA1c 5.3 EEG diffuse slowing.  No seizure activity VTE prophylaxis - heparin subq aspirin 81 mg daily and Brilinta  (ticagrelor) 90 mg bid prior to admission, now on aspirin 81 mg daily alone. No anticoagulation at this time due to anemia, thrombocytopenia and elevated INR Therapy recommendations: SNF Disposition: Pending  Respiratory failure Aspiration Intubated for airway protection CCM on board Vent management per CCM On Zosyn and Vancomycin  Hx of Stroke/TIA 09/2022 admitted for bilateral ACA infarct, left more than right. CTA head and neck showed right A3 stenosis. EF 55 to 60%, no DVT. LDL 45, A1c 5.6. Discharged on DAPT and Lipitor 40 and Zetia. Loop recorder placed.  03/2023 admitted for small acute to subacute infarct on the left frontal and left parietal lobes, and right temporal white matter.  CTA head and neck similar diminutive distal right ACA.  EF 60 to 65%.  Loop recorder no A-fib.  LDL 47, A1c 8.2.  Discharged on aspirin and Brilinta for 30 days and then Plavix alone.  Also discharged on Lipitor and Zetia.  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  Cervical myelopathy  MRI C-spine  Severe canal stenosis at C3-C4. Cord T2 hyperintensity superior to this level, suspicious for edema and compressive myelopathy. Severe left and moderate right foraminal stenosis at this level. MRI T-spine Edema involving the left T1 inferior articular facet Neurosurgery consulted, recommend cervical surgery once medically stable.  Currently Brilinta on hold in preparation for surgery. Continue aspirin 81  Hx of hypertension No hypotension Home meds: Lotensin 40 mg Hygroton 25 mg Stable on pressor Long-term BP goal normotensive  Hyperlipidemia Home meds: Atorvastatin 40 mg and Zetia 10 mg LDL 47, goal < 70 Statin and zetia are on hold given acute liver injury Continue statin once LFTs normalize  Diabetes type II Controlled Home  meds: Metformin, Toujeo HgbA1c 5.3, goal < 7.0 CBGs SSI Recommend close follow-up with PCP for better DM control  Acute liver injury AST/ALT [id number] INR 1.4--1.3--1.4 Close monitoring Management per CCM  Other Stroke Risk Factors Obstructive sleep apnea, not on CPAP at home Advanced age  Other Active Problems BPH Depression, psychiatry on board B12 deficiency Cognitive impairment   Hospital day # 7  This patient is critically ill due to embolic stroke, hypercoagulable state, respiratory failure, and at significant risk of neurological worsening, death form further stroke, hemorrhagic transformation, PE/DVT. This patient's care requires constant monitoring of vital signs, hemodynamics, respiratory and cardiac monitoring, review of multiple databases, neurological assessment, discussion with family, other specialists and medical decision making of high complexity. I spent 40 minutes of neurocritical care time in the care of this patient.  I discussed with Dr. Everardo All CCM.  Marvel Plan, MD PhD Stroke Neurology 08/08/2023 1:40 PM    To contact Stroke Continuity provider, please refer to [email or web address]. After hours, contact General Neurology

## 2023-08-08 NOTE — Progress Notes (Signed)
 PROGRESS NOTE                                                                                                                                                                                                             Patient Demographics:    Kenneth [name], is a 73 y.o. male, DOB - [birth year], JXB:[id number]  Outpatient Primary MD for the patient is [name], MD    LOS - 7  Admit date - 08/01/2023    Chief Complaint  Patient presents with   Weakness       Brief Narrative (HPI from H&P)   73 y.o. male, with history of stroke, currently on DAPT, dyslipidemia, BPH, prostate cancer, insulin-dependent DM type II, hypertension, cervical stenosis, history of iron deficiency anemia, history of OSA does not wear CPAP, recent history of right ankle strain 2 weeks ago, presents to the hospital for 2-3 week history of gradually progressive weakness, was also not eating or drinking well for a few days.  For the last couple of days he has been having problems getting out of the bed and ambulating as he is extremely weak, in the ER he was found to be hypotensive, had hypomagnesemia, was also found to have new onset anemia and I was called to admit the patient.  Further workup suggested that patient had ongoing cryptogenic strokes despite being on DAPT, generalized weakness, C-spine compression.  He was seen by neurology, neurosurgery, overall extremely frail, poor cough reflex and swallowing capacity, now developing aspiration pneumonia.   Subjective:   Patient in bed, appears weak n frail, denies any headache, no fever, no chest pain or pressure, no shortness of breath , no abdominal pain. No new focal weakness.   Assessment  & Plan :    1.  Gradually progressive generalized weakness in a patient with gradually progressive anemia of chronic disease, dehydration, hypomagnesemia.  Along with recurrent cryptogenic strokes.   Patient has  had chronic anemia for several years however there is at least a 3 g drop in his hemoglobin in the last few months, of note he has been started on DAPT for his recent stroke around that timeframe, he denies any hematemesis, no dark stools or blood in stool.  He likely has intermittent chronic GI blood loss, Hemoccult negative in the ER, place him on PPI, for now continue  DAPT, GI has been consulted, EGD done on 08/02/2023 did not show any acute bleed, stable peripheral smear, anemia panel noted with possible mild iron deficiency and B12 deficiency both have been replaced, CBC is currently stable, on PPI, plz  see below.     2.   Recent stroke few months ago, 2 episodes of cryptogenic stroke in the last several months, now appears to have a third episode of subacute to acute cryptogenic strokes this admission, history of hypercoagulable state.  On DAPT, statin and Zetia, MRI noted, had 2 different episodes of cryptogenic bilateral strokes 2 months and 8 months ago respectively, MRI now again shows new bilateral cryptogenic strokes spite being on DAPT, seen by stroke team, he also has history of hypercoagulable state for which se has seen hematology in the past.    Will request hematology to see the patient here and opine if he would benefit from anticoagulation in place of DAPT which is clearly not working.  He also has a spot on his spleen will request hematology to review.  His loop recorder evaluation this admission is stable.  He tentatively needs C-spine surgery as well hopefully we can start him on anticoagulation if suggested after the C-spine surgery.  For now continue aspirin, statin and Zetia, Brilinta last dose given 08/05/2023 and now held in lieu of his C-spine surgery later this admission if he remains stable    3.  MRI evidence of C-spine compression.  Currently no focal deficits, seen by Dr. Franky Macho, likely will require surgical decompression likely coming Monday, Brilinta last dose 08/06/2023  morning, it has been discontinued, currently on baby aspirin, he is very frail at baseline he is at the very least moderate risk for adverse cardiopulmonary outcome, will have cardiology also evaluate him prior to surgery.  Seen and cleared by cardiology.  However overall extremely frail and now developing aspiration pneumonia.  For now we will hold any surgical intervention, once he is medically stable will inform neurosurgery of the same.  Likely middle or late next week.   4.   Metabolic encephalopathy, generalized weakness.  Underlying depression.  No new focal deficits, currently see above, had extensive workup and seen by neurology, stable TSH, stable ABG, stable ammonia, stable EEG, MRI showing multiple acute and subacute strokes, kindly see below.  Neurology and psychiatry following.  Psych medications adjusted per psychiatry  5.  Early sepsis with aspiration pneumonia night of 08/06/2023.  Blood cultures obtained, IV fluid bolus and maintenance, IV antibiotics, MRSA nasal PCR, respiratory viral panel, follow cultures, for now we will make him n.p.o. till mentation improves.  Continue to monitor closely.  Speech therapy already on board.  Patient remains somnolent, inability to protect airway questionable, PCCM consulted on 08/07/2023, marginal improvement, transferred to ICU on 08/08/2023 for airway protection/intubation.   6.  Hypercoagulable state, new density in the spleen.  Hematology consulted as above.     7.  Dyslipidemia.  Continue statin and Zetia.   8.  Essential hypertension.  Currently blood pressure low, dehydrated, hold blood pressure medications hydrate and monitor.     9.  History of cervical stenosis, recent right ankle strain.  Supportive care.     10.  Borderline iron and B12 deficiency.  Replaced.    11.  Hyponatremia.  has been hydrated adequately, stable.  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  Fluid restriction and monitor.  12. Hypomagnesemia and dehydration.   Hydrated with IV fluids, magnesium replaced stable.  13. Oral intake and  appetite.  Add Megace.  Treat depression.    14. OSA.  Does not use CPAP.    15.  Asymptomatic transaminitis.  Likely due to hypotension, stable right upper quadrant ultrasound, negative acute hepatitis panel, monitor trend.  16. BPH.  Continue Flomax.  UA stable no UTI.  16. Depression.  Psych consulted, Lexapro was stopped by psych, defer any additional medications or changes in psych medications to the psych team.  17.  Thyroid nodule on CT scan, stable TSH, free T4 and thyroid ultrasound  18. DM type II.  On high doses of insulin at home, continue with q. [facility] S sliding scale.  Every 2 Accu-Cheks while n.p.o. on 08/02/2023.   CBG (last 3)  Recent Labs    08/07/23 1647 08/07/23 2143 08/08/23 0807  GLUCAP 97 83 132*    Lab Results  Component Value Date   HGBA1C 5.3 08/01/2023         Condition - Guarded  Family Communication  : Wife on the day of admission 08/01/2023 bedside, wife over the phone 08/02/2023, bedside 08/03/2023, 08/04/2023 x 3, 08/05/2023, called wife multiple times 08/06/2023 between 9 and 10 AM, total 5 different times phone switched off.  Wife updated again evening of 08/06/2023, morning of 08/07/2023, updated wife in detail on 08/08/2023  Code Status : Full code  Consults  : GI, Neuro, Cards, N. Surgery, hematology  PUD Prophylaxis : PPI   Procedures  :     Right upper quadrant ultrasound.  Unremarkable.   CT chest abdomen pelvis.  1. No acute intrathoracic or intra-abdominal pathology identified. 2. Moderate coronary artery calcification. 3. 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 4. Enlarging 3.4 cm complex hypoechoic lesion within the upper pole the spleen demonstrating heterogeneous attenuation with a probable solid component  superiorly. Differential considerations include a hypoenhancing hamartoma, low-grade lymphoma, or potentially inflammatory pseudotumor, however, this is not well characterized on this examination. This could be simultaneously assessed with dedicated MRI examination of the adrenal glands. 5. Bilateral adrenal nodules have developed since prior examination and are indeterminate, measuring 40-55 Hounsfield units in density. These measure up to 2.5 cm in diameter on left. Dedicated contrast enhanced MRI examination is recommended for further characterization. 6. Mild splenomegaly. 7. Mild anasarca with trace ascites and body wall retroperitoneal edema. 8. Stable 19 mm nodule within the posterior right thyroid lobe, not well characterized on this examination. Recommend thyroid US  EEG.  Nonacute.    MRI brain.  Many small acute infarcts in the bilateral frontal and parietal lobes, left thalamus, right occipital lobe, and cerebellum. Given involvement of multiple vascular territories, consider an embolic etiology  MRI C-spine.  1. 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 Cord T2 hyperintensity superior to this level, suspicious for edema and compressive myelopathy. Severe left and moderate right foraminal stenosis at this level. 2. Edema involving the left T1 inferior articular facet, which is nonspecific but most likely related to stress. A CT could better assess bone detail and exclude fracture if clinically warranted.  CT head.  1. No evidence of an acute intracranial abnormality. 2. 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 3. 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 4. Moderate generalized cerebral atrophy. 5. Paranasal sinus disease as described.   EGD  Impression:               -  The examined portions of the nasopharynx,                            oropharynx and  larynx were normal.                           - Normal esophagus.                           - 3 cm hiatal hernia with prolapsing cardia.                           - Gastritis. Biopsied.                           - Normal examined duodenum.                           - No obvious source of GI bleeding. Doubt mild                            gastritis would have been a source of significant                            bleed. Moderate Sedation:      N/A Recommendation:           - Patient has a contact number available for                            emergencies. The signs and symptoms of potential                            delayed complications were discussed with the                            patient. Return to normal activities tomorrow.                            Written discharge instructions were provided to the                            patient.                           - Resume previous diet.                           - Continue present medications including                            Brillinta/ASA.                           - Await pathology results.                           - Consider non-GI sources of anemia, given  absence                            of overt bleeding, negative FOBT and concomitant                            thrombocytopenia.                           - Colonoscopy could be considered for further                            evaluation, but likely low yield, given his recent                            colonoscopy in 2021.                           - GI will sign off for now. If no other possible                            etiologies for anemia identified, or if patient has                            overt GI bleeding, please reconsult GI.      Disposition Plan  :    Status is: Inpatient   DVT Prophylaxis  :    heparin injection 5,000 Units Start: 08/06/23 2200 Place and maintain sequential compression device Start: 08/06/23 1801 SCDs Start: 08/01/23  1239 Place TED hose Start: 08/01/23 1233    Lab Results  Component Value Date   PLT 89 (L) 08/08/2023    Diet :  Diet Order             Diet NPO time specified Except for: Sips with Meds  Diet effective now                    Inpatient Medications  Scheduled Meds:  aspirin  81 mg Oral Daily   atorvastatin  40 mg Oral QHS   vitamin B-12  1,000 mcg Oral Daily   etomidate  20 mg Intravenous Once   ezetimibe  10 mg Oral Daily   fentaNYL (SUBLIMAZE) injection  50 mcg Intravenous Once   ferrous sulfate  325 mg Oral BID WC   folic acid  1 mg Oral Daily   Gerhardt's butt cream   Topical BID   heparin injection (subcutaneous)  5,000 Units Subcutaneous Q8H   insulin aspart  0-15 Units Subcutaneous TID WC   insulin aspart  0-5 Units Subcutaneous QHS   insulin glargine-yfgn  30 Units Subcutaneous Q1400   midazolam  1 mg Intravenous Once   mirtazapine  7.5 mg Oral QHS   pantoprazole  40 mg Oral BID AC   rocuronium  100 mg Intravenous Once   tamsulosin  0.4 mg Oral QPM   thiamine  100 mg Oral q AM   venlafaxine XR  37.5 mg Oral Q breakfast   Continuous Infusions:  lactated ringers     piperacillin-tazobactam (ZOSYN)  IV      PRN Meds:.acetaminophen **OR** acetaminophen, albuterol, bisacodyl, EPINEPHrine, menthol-cetylpyridinium, [DISCONTINUED] ondansetron **OR** ondansetron (ZOFRAN) IV, phenol  Antibiotics  :    Anti-infectives (From admission, onward)    Start     Dose/Rate Route Frequency Ordered Stop   08/08/23 1200  vancomycin (VANCOREADY) IVPB 750 mg/150 mL  Status:  Discontinued        750 mg 150 mL/hr over 60 Minutes Intravenous Every 12 hours 08/08/23 0055 08/08/23 0944   08/08/23 1100  piperacillin-tazobactam (ZOSYN) IVPB 3.375 g        3.375 g 12.5 mL/hr over 240 Minutes Intravenous Every 8 hours 08/08/23 0949     08/08/23 0115  vancomycin (VANCOREADY) IVPB 1500 mg/300 mL        1,500 mg 150 mL/hr over 120 Minutes Intravenous  Once 08/08/23 0024 08/08/23  0317   08/07/23 0900  Ampicillin-Sulbactam (UNASYN) 3 g in sodium chloride 0.9 % 100 mL IVPB  Status:  Discontinued        3 g 200 mL/hr over 30 Minutes Intravenous Every 6 hours 08/07/23 0803 08/08/23 0944   08/07/23 0830  doxycycline (VIBRAMYCIN) 100 mg in sodium chloride 0.9 % 250 mL IVPB  Status:  Discontinued        100 mg 125 mL/hr over 120 Minutes Intravenous 2 times daily 08/07/23 0725 08/08/23 0011   08/07/23 0600  cefTRIAXone (ROCEPHIN) 2 g in sodium chloride 0.9 % 100 mL IVPB  Status:  Discontinued        2 g 200 mL/hr over 30 Minutes Intravenous Every 24 hours 08/07/23 0452 08/07/23 0725         Objective:   Vitals:   08/08/23 0845 08/08/23 0900 08/08/23 0905 08/08/23 0912  BP: (!) 104/53 (!) 110/53 (!) 113/54   Pulse: 75 78 72 79  Resp: 20 12 14    Temp:   (!) 100.8 F (38.2 C)   TempSrc:   Axillary   SpO2: 93% 95% 94%   Weight:      Height:        Wt Readings from Last 3 Encounters:  08/01/23 86.6 kg  05/21/23 88.7 kg  05/01/23 89.3 kg     Intake/Output Summary (Last 24 hours) at 08/08/2023 1019 Last data filed at 08/08/2023 0911 Gross per 24 hour  Intake 0 ml  Output 1100 ml  Net -1100 ml     Physical Exam  pleasantly confused - more drowsy than before, No new F.N deficits, overall quite deconditioned [place].AT,PERRAL Supple Neck, No JVD,   Symmetrical Chest wall movement, Good air movement bilaterally, CTAB RRR,No Gallops,Rubs or new Murmurs,  +ve B.Sounds, Abd Soft, No tenderness,   No Cyanosis, Clubbing or edema        Data Review:    Recent Labs  Lab 08/03/23 1834 08/04/23 0452 08/05/23 0457 08/06/23 0018 08/06/23 0509 08/07/23 0601 08/08/23 0635  WBC 8.9 8.5 9.0 9.8 9.2 10.8* 10.6*  HGB 9.6* 8.7* 9.0* 8.7* 8.9* 8.5* 8.1*  HCT 27.3* 24.7* 26.3* 24.9* 25.6* 24.9* 23.6*  PLT 101* 101* 97* 95* 97* 91* 89*  MCV 86.7 84.6 86.5 85.0 85.3 85.0 86.4  MCH 30.5 29.8 29.6 29.7 29.7 29.0 29.7  MCHC 35.2 35.2 34.2 34.9 34.8 34.1 34.3  RDW  15.2 14.9 15.3 15.2 15.2 15.4 15.8*  LYMPHSABS 1.0 0.5* 0.9  --  1.1  --  1.1  MONOABS 1.9* 0.7 2.4*  --  2.2*  --  2.2*  EOSABS 0.0 0.0 0.0  --  0.0  --  0.0  BASOSABS 0.0 0.1 0.0  --  0.1  --  0.0  Recent Labs  Lab 08/01/23 1310 08/02/23 0549 08/02/23 0549 08/03/23 0513 08/04/23 0452 08/04/23 0845 08/05/23 0457 08/05/23 0500 08/06/23 0018 08/06/23 8295 08/07/23 0243 08/07/23 0601 08/07/23 0740 08/07/23 1403 08/07/23 1613 08/07/23 1901 08/08/23 0635 08/08/23 0812 08/08/23 0854  NA  --  131*   < > 131* 130*  --  132*  --  132* 134* 133*  --   --   --  135  --  137  --   --   K  --  3.7   < > 3.7 3.7  --  3.5  --  3.6 3.6 3.3*  --   --   --  4.1  --  4.0  --   --   CL  --  100   < > 102 101  --  101  --  102 102 102  --   --   --  103  --  105  --   --   CO2  --  22   < > 24 24  --  23  --  23 25 21*  --   --   --  22  --  21*  --   --   ANIONGAP  --  9   < > 5 5  --  8  --  7 7 10   --   --   --  10  --  11  --   --   GLUCOSE  --  143*   < > 146* 159*  --  136*  --  157* 140* 151*  --   --   --  121*  --  124*  --   --   BUN  --  12   < > 17 20  --  29*  --  31* 32* 34*  --   --   --  37*  --  32*  --   --   CREATININE  --  0.81   < > 0.87 0.89  --  0.88  --  0.94 0.96 1.16  --   --   --  1.10  --  1.09  --   --   AST  --  19   < > 25 47*  --  123*  --   --  206*  --   --   --   --  515*  --  376*  --   --   ALT  --  17   < > 22 40  --  123*  --   --  186*  --   --   --   --  469*  --  361*  --   --   ALKPHOS  --  43   < > 45 60  --  91  --   --  142*  --   --   --   --  234*  --  211*  --   --   BILITOT  --  1.4*   < > 1.3* 1.3*  --  1.5*  --   --  1.5*  --   --   --   --  1.2  --  1.7*  --   --   ALBUMIN  --  2.5*   < > 2.4* 2.2*  --  2.2*  --   --  2.1*  --   --   --   --  1.7*  --  1.6*  --   --   CRP  --   --   --   --   --   --   --   --   --  12.6* 15.6*  --   --   --   --   --  18.6*  --   --   PROCALCITON  --   --   --   --   --   --   --   --   --  0.65 1.35  --    --   --  1.27  --  1.17  --   --   LATICACIDVEN  --   --   --   --   --   --   --   --   --   --   --  2.2* 2.2* >9.0* 2.1* 2.3*  --   --   --   INR  --   --   --   --   --   --   --   --   --   --  1.4*  --   --   --  1.3*  --   --  1.4*  --   TSH 1.499  --   --   --   --   --   --   --   --   --   --  1.008  --   --   --   --   --   --   --   AMMONIA  --   --   --   --   --  17 16  --   --   --   --   --   --   --   --   --   --   --  25  BNP  --  72.3   < > 53.6 27.8  --   --  41.4  --  31.9 27.5  --   --   --   --   --  49.5  --   --   MG  --  1.7   < > 1.6* 1.9  --  1.8  --  1.8 1.8 1.8  --   --   --   --   --  1.7  --   --   PHOS  --  3.6  --  3.6 3.8  --  3.9  --   --   --   --   --   --   --   --   --  4.0  --   --   CALCIUM  --  8.3*   < > 7.8* 7.7*  --  8.0*  --  7.7* 8.1* 7.8*  --   --   --  7.8*  --  7.9*  --   --    < > = values in this interval not displayed.      Recent Labs  Lab 08/01/23 1310 08/02/23 6962 08/04/23 9528 08/04/23 0845 08/05/23 4132 08/05/23 0500 08/06/23 0018 08/06/23 4401 08/07/23 0272 08/07/23 0601 08/07/23 0740 08/07/23 1403 08/07/23 1613 08/07/23 1901 08/08/23 0635 08/08/23 0812 08/08/23 0854  CRP  --   --   --   --   --   --   --  12.6* 15.6*  --   --   --   --   --  18.6*  --   --  PROCALCITON  --   --   --   --   --   --   --  0.65 1.35  --   --   --  1.27  --  1.17  --   --   LATICACIDVEN  --   --   --   --   --   --   --   --   --  2.2* 2.2* >9.0* 2.1* 2.3*  --   --   --   INR  --   --   --   --   --   --   --   --  1.4*  --   --   --  1.3*  --   --  1.4*  --   TSH 1.499  --   --   --   --   --   --   --   --  1.008  --   --   --   --   --   --   --   AMMONIA  --   --   --  17 16  --   --   --   --   --   --   --   --   --   --   --  25  BNP  --    < > 27.8  --   --  41.4  --  31.9 27.5  --   --   --   --   --  49.5  --   --   MG  --    < > 1.9  --  1.8  --  1.8 1.8 1.8  --   --   --   --   --  1.7  --   --   CALCIUM  --    < > 7.7*   --  8.0*  --  7.7* 8.1* 7.8*  --   --   --  7.8*  --  7.9*  --   --    < > = values in this interval not displayed.    --------------------------------------------------------------------------------------------------------------- Lab Results  Component Value Date   CHOL 79 04/07/2023   HDL 17 (L) 04/07/2023   LDLCALC 47 04/07/2023   TRIG 75 04/07/2023   CHOLHDL 4.6 04/07/2023    Lab Results  Component Value Date   HGBA1C 5.3 08/01/2023   Recent Labs    08/07/23 0601  TSH 1.008  FREET4 1.01    No results for input(s): "VITAMINB12", "FOLATE", "FERRITIN", "TIBC", "IRON", "RETICCTPCT" in the last 72 hours.  ------------------------------------------------------------------------------------------------------------------ Cardiac Enzymes No results for input(s): "CKMB", "TROPONINI", "MYOGLOBIN" in the last 168 hours.  Invalid input(s): "CK"  Micro Results Recent Results (from the past 240 hours)  Resp panel by RT-PCR (RSV, Flu A&B, Covid) Anterior Nasal Swab     Status: None   Collection Time: 08/01/23  7:13 AM   Specimen: Anterior Nasal Swab  Result Value Ref Range Status   SARS Coronavirus 2 by RT PCR NEGATIVE NEGATIVE Final   Influenza A by PCR NEGATIVE NEGATIVE Final   Influenza B by PCR NEGATIVE NEGATIVE Final    Comment: (NOTE) The Xpert Xpress SARS-CoV-2/FLU/RSV plus assay is intended as an aid in the diagnosis of influenza from Nasopharyngeal swab specimens and should not be used as a sole basis for treatment. Nasal washings and aspirates are unacceptable for Xpert Xpress SARS-CoV-2/FLU/RSV testing.  Fact Sheet for Patients:  BloggerCourse.com  Fact Sheet for Healthcare Providers: SeriousBroker.it  This test is not yet approved or cleared by the [place] FDA and has been authorized for detection and/or diagnosis of SARS-CoV-2 by FDA under an Emergency Use Authorization (EUA). This EUA will remain in  effect (meaning this test can be used) for the duration of the COVID-19 declaration under Section 564(b)(1) of the Act, 21 U.S.C. section 360bbb-3(b)(1), unless the authorization is terminated or revoked.     Resp Syncytial Virus by PCR NEGATIVE NEGATIVE Final    Comment: (NOTE) Fact Sheet for Patients: BloggerCourse.com  Fact Sheet for Healthcare Providers: SeriousBroker.it  This test is not yet approved or cleared by the [place] FDA and has been authorized for detection and/or diagnosis of SARS-CoV-2 by FDA under an Emergency Use Authorization (EUA). This EUA will remain in effect (meaning this test can be used) for the duration of the COVID-19 declaration under Section 564(b)(1) of the Act, 21 U.S.C. section 360bbb-3(b)(1), unless the authorization is terminated or revoked.  Performed at [facility], [address] [place], [place] [address]   Respiratory (~20 pathogens) panel by PCR     Status: None   Collection Time: 08/07/23  1:25 AM   Specimen: Nasopharyngeal Swab; Respiratory  Result Value Ref Range Status   Adenovirus NOT DETECTED NOT DETECTED Final   Coronavirus 229E NOT DETECTED NOT DETECTED Final    Comment: (NOTE) The Coronavirus on the Respiratory Panel, DOES NOT test for the novel  Coronavirus (2019 nCoV)    Coronavirus HKU1 NOT DETECTED NOT DETECTED Final   Coronavirus NL63 NOT DETECTED NOT DETECTED Final   Coronavirus OC43 NOT DETECTED NOT DETECTED Final   Metapneumovirus NOT DETECTED NOT DETECTED Final   Rhinovirus / Enterovirus NOT DETECTED NOT DETECTED Final   Influenza A NOT DETECTED NOT DETECTED Final   Influenza B NOT DETECTED NOT DETECTED Final   Parainfluenza Virus 1 NOT DETECTED NOT DETECTED Final   Parainfluenza Virus 2 NOT DETECTED NOT DETECTED Final   Parainfluenza Virus 3 NOT DETECTED NOT DETECTED Final   Parainfluenza Virus 4 NOT DETECTED NOT DETECTED Final   Respiratory  Syncytial Virus NOT DETECTED NOT DETECTED Final   Bordetella pertussis NOT DETECTED NOT DETECTED Final   Bordetella Parapertussis NOT DETECTED NOT DETECTED Final   Chlamydophila pneumoniae NOT DETECTED NOT DETECTED Final   Mycoplasma pneumoniae NOT DETECTED NOT DETECTED Final    Comment: Performed at [facility], [address] [place], [place] [address]  Culture, blood (Routine X 2) w Reflex to ID Panel     Status: None (Preliminary result)   Collection Time: 08/07/23  2:43 AM   Specimen: BLOOD LEFT ARM  Result Value Ref Range Status   Specimen Description BLOOD LEFT ARM  Final   Special Requests   Final    BOTTLES DRAWN AEROBIC AND ANAEROBIC Blood Culture adequate volume   Culture  Setup Time   Final    GRAM POSITIVE COCCI ANAEROBIC BOTTLE ONLY CRITICAL RESULT CALLED TO, READ BACK BY AND VERIFIED WITH: PHARMD Merlene Morse [id number] @2306  FH    Culture   Final    GRAM POSITIVE COCCI CULTURE REINCUBATED FOR BETTER GROWTH Performed at [facility], [address], [place], [place] [address]    Report Status PENDING  Incomplete  Blood Culture ID Panel (Reflexed)     Status: Abnormal   Collection Time: 08/07/23  2:43 AM  Result Value Ref Range Status   Enterococcus faecalis NOT DETECTED  NOT DETECTED Final   Enterococcus Faecium NOT DETECTED NOT DETECTED Final   Listeria monocytogenes NOT DETECTED NOT DETECTED Final   Staphylococcus species DETECTED (A) NOT DETECTED Final    Comment: CRITICAL RESULT CALLED TO, READ BACK BY AND VERIFIED WITH: PHARMD V. ZOXW [id number] @2306  FH    Staphylococcus aureus (BCID) NOT DETECTED NOT DETECTED Final   Staphylococcus epidermidis DETECTED (A) NOT DETECTED Final    Comment: Methicillin (oxacillin) resistant coagulase negative staphylococcus. Possible blood culture contaminant (unless isolated from more than one blood culture draw or clinical case suggests pathogenicity). No antibiotic treatment is indicated for blood  culture  contaminants. CRITICAL RESULT CALLED TO, READ BACK BY AND VERIFIED WITH: PHARMD V. Annice Needy [id number] @2306  FH    Staphylococcus lugdunensis NOT DETECTED NOT DETECTED Final   Streptococcus species NOT DETECTED NOT DETECTED Final   Streptococcus agalactiae NOT DETECTED NOT DETECTED Final   Streptococcus pneumoniae NOT DETECTED NOT DETECTED Final   Streptococcus pyogenes NOT DETECTED NOT DETECTED Final   A.calcoaceticus-baumannii NOT DETECTED NOT DETECTED Final   Bacteroides fragilis NOT DETECTED NOT DETECTED Final   Enterobacterales NOT DETECTED NOT DETECTED Final   Enterobacter cloacae complex NOT DETECTED NOT DETECTED Final   Escherichia coli NOT DETECTED NOT DETECTED Final   Klebsiella aerogenes NOT DETECTED NOT DETECTED Final   Klebsiella oxytoca NOT DETECTED NOT DETECTED Final   Klebsiella pneumoniae NOT DETECTED NOT DETECTED Final   Proteus species NOT DETECTED NOT DETECTED Final   Salmonella species NOT DETECTED NOT DETECTED Final   Serratia marcescens NOT DETECTED NOT DETECTED Final   Haemophilus influenzae NOT DETECTED NOT DETECTED Final   Neisseria meningitidis NOT DETECTED NOT DETECTED Final   Pseudomonas aeruginosa NOT DETECTED NOT DETECTED Final   Stenotrophomonas maltophilia NOT DETECTED NOT DETECTED Final   Candida albicans NOT DETECTED NOT DETECTED Final   Candida auris NOT DETECTED NOT DETECTED Final   Candida glabrata NOT DETECTED NOT DETECTED Final   Candida krusei NOT DETECTED NOT DETECTED Final   Candida parapsilosis NOT DETECTED NOT DETECTED Final   Candida tropicalis NOT DETECTED NOT DETECTED Final   Cryptococcus neoformans/gattii NOT DETECTED NOT DETECTED Final   Methicillin resistance mecA/C DETECTED (A) NOT DETECTED Final    Comment: CRITICAL RESULT CALLED TO, READ BACK BY AND VERIFIED WITH: Mercer Pod [id number] @2306  FH Performed at [facility], [address], [place], [place] [address]   Culture, blood (Routine X 2) w Reflex to ID Panel     Status:  None (Preliminary result)   Collection Time: 08/07/23  2:45 AM   Specimen: BLOOD RIGHT HAND  Result Value Ref Range Status   Specimen Description BLOOD RIGHT HAND  Final   Special Requests   Final    BOTTLES DRAWN AEROBIC AND ANAEROBIC Blood Culture adequate volume   Culture   Final    NO GROWTH 1 DAY Performed at [facility], [address], [place], [place] [address]    Report Status PENDING  Incomplete  SARS Coronavirus 2 by RT PCR (hospital order, performed in [facility] hospital lab) *cepheid single result test* Anterior Nasal Swab     Status: None   Collection Time: 08/07/23  7:38 AM   Specimen: Anterior Nasal Swab  Result Value Ref Range Status   SARS Coronavirus 2 by RT PCR NEGATIVE NEGATIVE Final    Comment: Performed at [facility], [address], [place], [place] [address]  MRSA Next Gen by PCR, Nasal  Status: None   Collection Time: 08/07/23  7:38 AM   Specimen: Nasal Mucosa; Nasal Swab  Result Value Ref Range Status   MRSA by PCR Next Gen NOT DETECTED NOT DETECTED Final    Comment: (NOTE) The GeneXpert MRSA Assay (FDA approved for NASAL specimens only), is one component of a comprehensive MRSA colonization surveillance program. It is not intended to diagnose MRSA infection nor to guide or monitor treatment for MRSA infections. Test performance is not FDA approved in patients less than 63 years old. Performed at [facility], [address] [place], [place] [address]     Radiology Report DG Chest Port 1 View Result Date: 08/08/2023 CLINICAL DATA:  Shortness of breath. EXAM: PORTABLE CHEST 1 VIEW COMPARISON:  08/07/2023 FINDINGS: Stable cardiomediastinal contours. Loop recorder, unchanged. Persistent right pleural effusion with overlying pleuroparenchymal scarring. Left lung appears clear. No new findings. The visualized osseous structures appear intact. Postoperative changes noted in the cervical spine. IMPRESSION: Persistent right pleural  effusion with overlying pleuroparenchymal scarring. Electronically Signed   By: Signa Kell M.D.   On: 08/08/2023 07:56   CT Angio Abd/Pel w/ and/or w/o Result Date: 08/07/2023 CLINICAL DATA:  Hepatic insufficiency EXAM: CT ANGIOGRAPHY ABDOMEN AND PELVIS WITH CONTRAST AND WITHOUT CONTRAST TECHNIQUE: Multidetector CT imaging of the abdomen and pelvis was performed using the liver protocol during bolus administration of intravenous contrast. Because of an IV malfunction, a repeat bolus was required to obtain arterial phase images. Multiplanar reconstructed images and MIPs were obtained and reviewed to evaluate the vascular anatomy. RADIATION DOSE REDUCTION: This exam was performed according to the departmental dose-optimization program which includes automated exposure control, adjustment of the mA and/or kV according to patient size and/or use of iterative reconstruction technique. CONTRAST:  OMNIPAQUE IOHEXOL 350 MG/ML SOLN COMPARISON:  None Available. FINDINGS: VASCULAR Aorta: Normal caliber aorta without aneurysm, dissection, vasculitis or significant stenosis. Moderate atherosclerotic calcification Celiac: Common origin of the celiac axis and superior mesenteric artery. Wide patency. No aneurysm or dissection. SMA: Common origin of the superior mesenteric artery and celiac axis. Wide patency. No aneurysm or dissection. 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 Right renal artery is widely patent. Normal vascular morphology. No aneurysm or dissection. Accessory lower pole renal arteries noted bilaterally. IMA: Patent without evidence of aneurysm, dissection, vasculitis or significant stenosis. Inflow: Patent without evidence of aneurysm, dissection, vasculitis or significant stenosis. Proximal Outflow: Bilateral common femoral and visualized portions of the superficial and profunda femoral arteries are patent without evidence of aneurysm, dissection, vasculitis or significant  stenosis. Veins: Unremarkable.  Portal and hepatic veins are patent. Review of the MIP images confirms the above findings. 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 Mild cardiomegaly. Hepatobiliary: Cholelithiasis without superimposed pericholecystic inflammatory change. Liver unremarkable; no enhancing intrahepatic mass identified. No intra or extrahepatic biliary ductal dilation. Pancreas: Unremarkable Spleen: The previously noted hypodense lesion within the upper pole of the spleen demonstrates progressive enhancement on delayed phase images but remains hypoenhancing in relation to adjacent splenic parenchyma and differential considerations include low-grade lymphoma or atypical hamartoma. Adrenals/Urinary Tract: Bilateral adrenal nodules are again identified and are indeterminate due to technical artifact related repeat bolus imaging. These are better assessed on prior examination 08/06/2023. 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 The kidneys are otherwise unremarkable save for a simple cortical cyst within the upper pole the right  kidney for which no follow-up imaging is recommended. The bladder is decompressed. Stomach/Bowel: Stomach is within normal limits. Appendix appears normal. No evidence of bowel wall thickening, distention, or inflammatory changes. Trace ascites no free intraperitoneal gas. Lymphatic: No pathologic adenopathy within the abdomen and pelvis. Mild retroperitoneal edema again noted. Reproductive: Mild prostatic hypertrophy. Other: Mild diffuse subcutaneous body wall edema. Small bilateral fat containing inguinal hernias. Musculoskeletal: No acute bone abnormality. No lytic or blastic lesion. IMPRESSION: 1. No acute intra-abdominal pathology identified. No definite radiographic explanation for the patient's reported symptoms. 2. 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 3. Patency of the hepatic arterial and venous structures. 4. Cholelithiasis. 5. Stable mild left UPJ obstruction. 6. Bilateral adrenal nodules are again identified and are indeterminate due to technical artifact related repeat bolus imaging. These are better assessed on prior examination 08/06/2023. Dedicated contrast enhanced MRI examination is recommended. 7. 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 8. Mild cardiomegaly. 9. The previously noted hypodense lesion within the upper pole of the spleen demonstrates progressive enhancement on delayed phase images but remains hypoenhancing in relation to adjacent splenic parenchyma and differential considerations include low-grade lymphoma or atypical hamartoma. This could be simultaneously assessed with contrast enhanced MRI examination. Aortic Atherosclerosis (ICD10-I70.0). Electronically Signed   By: Helyn Numbers M.D.   On: 08/07/2023 22:01   US THYROID Result Date: 08/07/2023 CLINICAL DATA:  Nodule posterior right on recent CT chest EXAM: THYROID ULTRASOUND TECHNIQUE: Ultrasound examination of the thyroid gland and adjacent soft tissues was performed. COMPARISON:  CT 08/06/2023 FINDINGS: Parenchymal Echotexture: Mildly heterogenous Isthmus: 0.6 cm thickness Right lobe: 3.6 x 1.2 x 1.7 cm Left lobe: 3.5 x 1.7 x 1.7 cm _________________________________________________________ Estimated total number of nodules >/= 1 cm: 0 Number of spongiform nodules >/=  2 cm not described below (TR1): 0 Number of mixed cystic and solid nodules >/= 1.5 cm not described below (TR2): 0 _________________________________________________________ Technologist describes technically difficult study, limited secondary to patient inability to extend neck. No discrete nodules are seen within the thyroid gland. No regional cervical adenopathy demonstrated. IMPRESSION: Negative limited  study. The posterior right thyroid nodule described on CT was not identified. Electronically Signed   By: Corlis Leak M.D.   On: 08/07/2023 14:04   DG CHEST PORT 1 VIEW Result Date: 08/07/2023 CLINICAL DATA:  Follow-up right basilar opacity EXAM: PORTABLE CHEST 1 VIEW COMPARISON:  08/05/2023 FINDINGS: Cardiac shadow is within normal limits. Persistent small right effusion and basilar atelectasis is noted. Left lung remains clear. No bony abnormality is seen. Postsurgical changes in the cervical spine are noted. IMPRESSION: Stable right basilar opacity and effusion. Electronically Signed   By: Alcide Clever M.D.   On: 08/07/2023 02:25   US Abdomen Limited RUQ (LIVER/GB) Result Date: 08/07/2023 CLINICAL DATA:  Elevated LFTs EXAM: ULTRASOUND ABDOMEN LIMITED RIGHT UPPER QUADRANT COMPARISON:  CT from earlier in the same day. FINDINGS: Gallbladder: No gallstones or wall thickening visualized. No sonographic Murphy sign noted by sonographer. Common bile duct: Diameter: 4.7 mm. Liver: No focal lesion identified. Within normal limits in parenchymal echogenicity. Portal vein is patent on color Doppler imaging with normal direction of blood flow towards the liver. Other: None. IMPRESSION: No acute abnormality noted. Electronically Signed   By: Alcide Clever M.D.   On: 08/07/2023 00:44   CT CHEST ABDOMEN PELVIS W CONTRAST Result Date: 08/06/2023 CLINICAL DATA:  Abdomen or its really busy EXAM: CT  CHEST, ABDOMEN, AND PELVIS WITH CONTRAST TECHNIQUE: Multidetector CT imaging of the chest, abdomen and pelvis was performed following the standard protocol during bolus administration of intravenous contrast. RADIATION DOSE REDUCTION: This exam was performed according to the departmental dose-optimization program which includes automated exposure control, adjustment of the mA and/or kV according to patient size and/or use of iterative reconstruction technique. CONTRAST:  75mL OMNIPAQUE IOHEXOL 350 MG/ML SOLN COMPARISON:  CT  abdomen pelvis 04/20/2020 FINDINGS: CT CHEST FINDINGS Cardiovascular: Moderate coronary artery calcification. Global cardiac size within limits. Trace pericardial effusion. Central pulmonary arteries are of caliber. Mild atherosclerotic calcification within thoracic aorta. No aortic aneurysm. Mediastinum/Nodes: No pathologic thoracic adenopathy. Stable 19 mm nodule within the posterior right thyroid lobe, not well characterized on this examination. Esophagus unremarkable. 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 No superimposed focal pulmonary infiltrate. No pneumothorax. Central airways are widely patent. Musculoskeletal: No chest wall mass or suspicious bone lesions identified. Probable bone island within the T9 vertebral body. CT ABDOMEN PELVIS FINDINGS Hepatobiliary: No focal liver abnormality is seen. No gallstones, gallbladder wall thickening, or biliary dilatation. Pancreas: Unremarkable Spleen: Mild splenomegaly with the spleen measuring 13.5 cm in greatest dimension. Enlarging 3.4 cm complex hypoechoic lesion within the upper pole the spleen demonstrating heterogeneous attenuation with a probable solid component superiorly. Differential considerations include a hypoenhancing hamartoma, low-grade lymphoma, or potentially inflammatory pseudotumor, however, this is not well characterized on this examination. Adrenals/Urinary Tract: Bilateral adrenal nodules have developed since prior examination and are indeterminate, measuring 40-55 Hounsfield units in density. These measure up to 2.5 cm in diameter on left. The kidneys are normal in size and position. 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 Simple cortical cyst noted within the upper  pole the right kidney for which no follow-up imaging is recommended. The kidneys are otherwise unremarkable. The bladder is partially decompressed and is unremarkable. Stomach/Bowel: Mild perihepatic ascites. The stomach, small, and large bowel are unremarkable. Appendix normal. No free intraperitoneal gas. Vascular/Lymphatic: Aortic atherosclerosis. No enlarged abdominal or pelvic lymph nodes. Reproductive: Mild prostatic hypertrophy. Other: Mild subcutaneous body wall edema. Mild retroperitoneal edema. Small bilateral fat containing inguinal hernias. Musculoskeletal: No acute or significant osseous findings. IMPRESSION: 1. No acute intrathoracic or intra-abdominal pathology identified. 2. Moderate coronary artery calcification. 3. 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 4. Enlarging 3.4 cm complex hypoechoic lesion within the upper pole the spleen demonstrating heterogeneous attenuation with a probable solid component superiorly. Differential considerations include a hypoenhancing hamartoma, low-grade lymphoma, or potentially inflammatory pseudotumor, however, this is not well characterized on this examination. This could be simultaneously assessed with dedicated MRI examination of the adrenal glands. 5. Bilateral adrenal nodules have developed since prior examination and are indeterminate, measuring 40-55 Hounsfield units in density. These measure up to 2.5 cm in diameter on left. Dedicated contrast enhanced MRI examination is recommended for further characterization. 6. Mild splenomegaly. 7. Mild anasarca with trace ascites and body wall retroperitoneal edema. 8. Stable 19 mm nodule within the posterior right thyroid lobe, not well characterized on this examination. Recommend thyroid US (ref: J Am Coll Radiol. 2015 Feb;12(2): 143-50). Aortic Atherosclerosis  (ICD10-I70.0). Electronically Signed   By: Lyda Kalata.D.  On: 08/06/2023 15:14   ECHOCARDIOGRAM COMPLETE Result Date: 08/06/2023    ECHOCARDIOGRAM REPORT   Patient Name:   GARNIE BORCHARDT Date of Exam: 08/06/2023 Medical Rec #:  [id number]        Height:       71.0 in Accession #:    [id number]       Weight:       191.0 lb Date of Birth:  1951-01-11        BSA:          2.068 m Patient Age:    72 years         BP:           110/87 mmHg Patient Gender: M                HR:           72 bpm. Exam Location:  Inpatient Procedure: 2D Echo, Cardiac Doppler, Color Doppler and Intracardiac            Opacification Agent (Both Spectral and Color Flow Doppler were            utilized during procedure). Indications:    Stroke  History:        Patient has prior history of Echocardiogram examinations, most                 recent 04/07/2023. CHF, Stroke, Signs/Symptoms:Syncope; Risk                 Factors:Dyslipidemia, Diabetes, Hypertension and Sleep Apnea.  Sonographer:    Sheralyn Boatman RDCS Referring Phys: [id number] DENISE A WOLFE  Sonographer Comments: Technically difficult study due to poor echo windows, Technically challenging study due to limited acoustic windows, suboptimal parasternal window, suboptimal apical window and suboptimal subcostal window. Image acquisition challenging due to respiratory motion. Suboptimal windows despite attempts to turn patient. IMPRESSIONS  1. Left ventricular ejection fraction, by estimation, is 60 to 65%. The left ventricle has normal function. The left ventricle has no regional wall motion abnormalities. Left ventricular diastolic parameters are consistent with Grade I diastolic dysfunction (impaired relaxation).  2. Right ventricular systolic function is normal. The right ventricular size is normal. Tricuspid regurgitation signal is inadequate for assessing PA pressure.  3. The mitral valve was not well visualized. No evidence of mitral valve regurgitation. No evidence of mitral  stenosis.  4. The aortic valve was not well visualized. There is mild calcification of the aortic valve. Aortic valve regurgitation is not visualized. Aortic valve sclerosis is present, with no evidence of aortic valve stenosis. Aortic valve Vmax measures 2.19 m/s.  5. The inferior vena cava is normal in size with greater than 50% respiratory variability, suggesting right atrial pressure of 3 mmHg. FINDINGS  Left Ventricle: Left ventricular ejection fraction, by estimation, is 60 to 65%. The left ventricle has normal function. The left ventricle has no regional wall motion abnormalities. Definity contrast agent was given IV to delineate the left ventricular  endocardial borders. The left ventricular internal cavity size was normal in size. There is no left ventricular hypertrophy. Left ventricular diastolic parameters are consistent with Grade I diastolic dysfunction (impaired relaxation). Right Ventricle: The right ventricular size is normal. No increase in right ventricular wall thickness. Right ventricular systolic function is normal. Tricuspid regurgitation signal is inadequate for assessing PA pressure. Left Atrium: Left atrial size was normal in size. Right Atrium: Right atrial size was normal in size. Pericardium: There is no evidence of pericardial effusion.  Mitral Valve: The mitral valve was not well visualized. No evidence of mitral valve regurgitation. No evidence of mitral valve stenosis. Tricuspid Valve: The tricuspid valve is not well visualized. Tricuspid valve regurgitation is not demonstrated. No evidence of tricuspid stenosis. Aortic Valve: The aortic valve was not well visualized. There is mild calcification of the aortic valve. Aortic valve regurgitation is not visualized. Aortic valve sclerosis is present, with no evidence of aortic valve stenosis. Aortic valve mean gradient measures 9.0 mmHg. Aortic valve peak gradient measures 19.2 mmHg. Aortic valve area, by VTI measures 2.56 cm. Pulmonic  Valve: The pulmonic valve was normal in structure. Pulmonic valve regurgitation is not visualized. No evidence of pulmonic stenosis. Aorta: The aortic root is normal in size and structure. Venous: The inferior vena cava is normal in size with greater than 50% respiratory variability, suggesting right atrial pressure of 3 mmHg. IAS/Shunts: No atrial level shunt detected by color flow Doppler.  LEFT VENTRICLE PLAX 2D LVIDd:         4.90 cm LVIDs:         3.50 cm LV PW:         1.00 cm LV IVS:        0.90 cm LVOT diam:     2.30 cm LV SV:         91 LV SV Index:   44 LVOT Area:     4.15 cm  LV Volumes (MOD) LV vol d, MOD A2C: 95.7 ml LV vol d, MOD A4C: 108.0 ml LV vol s, MOD A2C: 46.4 ml LV vol s, MOD A4C: 38.6 ml LV SV MOD A2C:     49.3 ml LV SV MOD A4C:     108.0 ml LV SV MOD BP:      67.9 ml RIGHT VENTRICLE             IVC RV S prime:     14.00 cm/s  IVC diam: 1.90 cm TAPSE (M-mode): 3.2 cm LEFT ATRIUM             Index        RIGHT ATRIUM           Index LA diam:        3.50 cm 1.69 cm/m   RA Area:     10.90 cm LA Vol (A2C):   41.3 ml 19.97 ml/m  RA Volume:   19.40 ml  9.38 ml/m LA Vol (A4C):   62.6 ml 30.28 ml/m LA Biplane Vol: 51.3 ml 24.81 ml/m  AORTIC VALVE AV Area (Vmax):    2.16 cm AV Area (Vmean):   2.48 cm AV Area (VTI):     2.56 cm AV Vmax:           219.00 cm/s AV Vmean:          139.000 cm/s AV VTI:            0.355 m AV Peak Grad:      19.2 mmHg AV Mean Grad:      9.0 mmHg LVOT Vmax:         114.00 cm/s LVOT Vmean:        83.100 cm/s LVOT VTI:          0.219 m LVOT/AV VTI ratio: 0.62  AORTA Ao Root diam: 3.10 cm MITRAL VALVE MV Area (PHT): 3.08 cm    SHUNTS MV Decel Time: 246 msec    Systemic VTI:  0.22 m MV E velocity: 82.70 cm/s  Systemic  Diam: 2.30 cm MV A velocity: 90.30 cm/s MV E/A ratio:  0.92 Donato Schultz MD Electronically signed by Donato Schultz MD Signature Date/Time: 08/06/2023/1:02:26 PM    Final       Signature  -   Susa Raring M.D on 08/08/2023 at 10:19 AM   -  To page go to  [email or web address]

## 2023-08-08 NOTE — Procedures (Signed)
 Intubation Procedure Note  Kenneth [name]  [id number]  [birth year]  Date:08/08/23  Time:11:06 AM   Provider Performing:Maevis Mumby [name], MD   Procedure: Intubation (31500)  Indication(s) Respiratory Failure  Consent Risks of the procedure as well as the alternatives and risks of each were explained to the patient and/or caregiver.  Consent for the procedure was obtained and is signed in the bedside chart   Anesthesia Etomidate, Versed, and Fentanyl   Time Out Verified patient identification, verified procedure, site/side was marked, verified correct patient position, special equipment/implants available, medications/allergies/relevant history reviewed, required imaging and test results available.   Sterile Technique Usual hand hygeine, masks, and gloves were used   Procedure Description Patient positioned in bed supine.  Sedation given as noted above.  Patient was intubated with endotracheal tube using Glidescope.  View was Grade 1 full glottis .  Number of attempts was 1.  Colorimetric CO2 detector was consistent with tracheal placement.   Complications/Tolerance None; patient tolerated the procedure well. Chest X-ray is ordered to verify placement.   EBL Minimal   Specimen(s) None

## 2023-08-08 NOTE — Plan of Care (Signed)
 Pt has rested quietly throughout the night with no distress noted. Will open eyes when name called. Will shake head yes and no to questions but not really verbalizing tonight. On O2 2LNC. SR on the monitor. LR at 100. Purewick intact to suction. No complaints voiced.    Problem: Fluid Volume: Goal: Ability to maintain a balanced intake and output will improve Outcome: Progressing   Problem: Metabolic: Goal: Ability to maintain appropriate glucose levels will improve Outcome: Progressing   Problem: Clinical Measurements: Goal: Respiratory complications will improve Outcome: Progressing Goal: Cardiovascular complication will be avoided Outcome: Progressing   Problem: Coping: Goal: Level of anxiety will decrease Outcome: Progressing   Problem: Pain Managment: Goal: General experience of comfort will improve and/or be controlled Outcome: Progressing   Problem: Skin Integrity: Goal: Risk for impaired skin integrity will decrease Outcome: Progressing

## 2023-08-08 NOTE — Progress Notes (Signed)
 Transported to 4N ICU room 4N 24 urgently due to decreasing LOC and concern for neurologic event.  Dr. Everardo All at bedside previously and called Wife/[name] to notify her of patient's transfer to ICU for a higher level of care.

## 2023-08-08 NOTE — Progress Notes (Signed)
 Patient transported to MRI and returned to 4N24 without incident.

## 2023-08-08 NOTE — Progress Notes (Signed)
 Peripherally Inserted Central Catheter Placement  The IV Nurse has discussed with the patient and/or persons authorized to consent for the patient, the purpose of this procedure and the potential benefits and risks involved with this procedure.  The benefits include less needle sticks, lab draws from the catheter, and the patient may be discharged home with the catheter. Risks include, but not limited to, infection, bleeding, blood clot (thrombus formation), and puncture of an artery; nerve damage and irregular heartbeat and possibility to perform a PICC exchange if needed/ordered by physician.  Alternatives to this procedure were also discussed.  Bard Power PICC patient education guide, fact sheet on infection prevention and patient information card has been provided to patient /or left at bedside. Telephone consent obtained from spouse.   PICC Placement Documentation  PICC Triple Lumen 08/08/23 Left Basilic 45 cm 0 cm (Active)  Indication for Insertion or Continuance of Line Vasoactive infusions 08/08/23 1800  Exposed Catheter (cm) 0 cm 08/08/23 1800  Site Assessment Clean, Dry, Intact 08/08/23 1800  Lumen #1 Status Saline locked;Blood return noted 08/08/23 1800  Lumen #2 Status Saline locked;Blood return noted 08/08/23 1800  Lumen #3 Status Saline locked;Blood return noted 08/08/23 1800  Dressing Type Transparent;Securing device 08/08/23 1800  Dressing Status Antimicrobial disc/dressing in place;Clean, Dry, Intact 08/08/23 1800  Line Care Connections checked and tightened 08/08/23 1800  Line Adjustment (NICU/IV Team Only) No 08/08/23 1800  Dressing Intervention New dressing 08/08/23 1800  Dressing Change Due 08/14/23 08/08/23 1800       Burnard Bunting Chenice 08/08/2023, 6:09 PM

## 2023-08-08 NOTE — Progress Notes (Signed)
 SLP Cancellation Note  Patient Details Name: Kenneth [name] MRN: [id number] DOB: [birth year]   Cancelled treatment:       Reason Eval/Treat Not Completed: Medical issues which prohibited therapy (Patient lethargic this am so eval head. Per RN, patient now intubated. Will sign off. Please re consult when appropriate.)  Ferdinand Lango MA, CCC-SLP  Kenneth [name] 08/08/2023, 1:04 PM

## 2023-08-08 NOTE — Progress Notes (Signed)
 ABG sample obtained without complication. Sample sent and lab notified. RT will continue to monitor and be available as needed.

## 2023-08-08 NOTE — Progress Notes (Signed)
 MEWS Progress Note  Patient Details Name: Kenneth [name] MRN: [id number] DOB: [birth year] Today's Date: 08/08/2023   MEWS Flowsheet Documentation:  Assess: MEWS Score Temp: (!) 100.8 F (38.2 C) BP: (!) 113/54 MAP (mmHg): 71 Pulse Rate: 79 ECG Heart Rate: 73 Resp: 14 Level of Consciousness: Responds to Pain SpO2: 94 % O2 Device: Room Air Patient Activity (if Appropriate): In bed O2 Flow Rate (L/min): 2 L/min FiO2 (%): 21 % Assess: MEWS Score MEWS Temp: 1 MEWS Systolic: 0 MEWS Pulse: 0 MEWS RR: 0 MEWS LOC: 2 MEWS Score: 3 MEWS Score Color: Yellow Assess: SIRS CRITERIA SIRS Temperature : 0 SIRS Respirations : 0 SIRS Pulse: 0 SIRS WBC: 0 SIRS Score Sum : 0 SIRS Temperature : 0 SIRS Pulse: 0 SIRS Respirations : 0 SIRS WBC: 0 SIRS Score Sum : 0 Assess: if the MEWS score is Yellow or Red Were vital signs accurate and taken at a resting state?: Yes Does the patient meet 2 or more of the SIRS criteria?: No MEWS guidelines implemented : Yes, yellow Treat MEWS Interventions: Considered administering scheduled or prn medications/treatments as ordered Take Vital Signs Increase Vital Sign Frequency : Yellow: Q2hr x1, continue Q4hrs until patient remains green for 12hrs Escalate MEWS: Escalate: Yellow: Discuss with charge nurse and consider notifying provider and/or RRT Notify: Charge Nurse/RN Name of Charge Nurse/RN Notified: Aubery Lapping RN Provider Notification Provider Name/Title: Dr. Thedore Mins / Attending Date Provider Notified: 08/08/23 Time Provider Notified: 0900 Method of Notification: Rounds Notification Reason: Change in status (Decreased mentation, unable to follow commands) Provider response: At bedside, See new orders Date of Provider Response: 08/08/23 Time of Provider Response: 0905 Notify: Rapid Response Name of Rapid Response RN Notified: Nikki RN Date Rapid Response Notified: 08/08/23 Time Rapid Response Notified: 4098   Kallie Locks 08/08/2023,  9:20 AM

## 2023-08-09 ENCOUNTER — Inpatient Hospital Stay (HOSPITAL_COMMUNITY)

## 2023-08-09 DIAGNOSIS — D6859 Other primary thrombophilia: Secondary | ICD-10-CM | POA: Diagnosis not present

## 2023-08-09 DIAGNOSIS — J9601 Acute respiratory failure with hypoxia: Secondary | ICD-10-CM | POA: Diagnosis not present

## 2023-08-09 DIAGNOSIS — I639 Cerebral infarction, unspecified: Secondary | ICD-10-CM

## 2023-08-09 DIAGNOSIS — D649 Anemia, unspecified: Secondary | ICD-10-CM | POA: Diagnosis not present

## 2023-08-09 DIAGNOSIS — D6851 Activated protein C resistance: Secondary | ICD-10-CM | POA: Diagnosis not present

## 2023-08-09 DIAGNOSIS — R579 Shock, unspecified: Secondary | ICD-10-CM

## 2023-08-09 DIAGNOSIS — I63443 Cerebral infarction due to embolism of bilateral cerebellar arteries: Secondary | ICD-10-CM | POA: Diagnosis not present

## 2023-08-09 DIAGNOSIS — R7401 Elevation of levels of liver transaminase levels: Secondary | ICD-10-CM | POA: Diagnosis not present

## 2023-08-09 DIAGNOSIS — E785 Hyperlipidemia, unspecified: Secondary | ICD-10-CM | POA: Diagnosis not present

## 2023-08-09 LAB — POCT I-STAT 7, (LYTES, BLD GAS, ICA,H+H)
Acid-base deficit: 7 mmol/L — ABNORMAL HIGH (ref 0.0–2.0)
Acid-base deficit: 7 mmol/L — ABNORMAL HIGH (ref 0.0–2.0)
Bicarbonate: 17.3 mmol/L — ABNORMAL LOW (ref 20.0–28.0)
Bicarbonate: 17.7 mmol/L — ABNORMAL LOW (ref 20.0–28.0)
Calcium, Ion: 1.15 mmol/L (ref 1.15–1.40)
Calcium, Ion: 1.19 mmol/L (ref 1.15–1.40)
HCT: 23 % — ABNORMAL LOW (ref 39.0–52.0)
HCT: 21 % — ABNORMAL LOW (ref 39.0–52.0)
Hemoglobin: 7.8 g/dL — ABNORMAL LOW (ref 13.0–17.0)
Hemoglobin: 7.1 g/dL — ABNORMAL LOW (ref 13.0–17.0)
O2 Saturation: 99 %
O2 Saturation: 99 %
Patient temperature: 102.5
Patient temperature: 98.3
Potassium: 4.3 mmol/L (ref 3.5–5.1)
Potassium: 4.5 mmol/L (ref 3.5–5.1)
Sodium: 136 mmol/L (ref 135–145)
Sodium: 134 mmol/L — ABNORMAL LOW (ref 135–145)
TCO2: 18 mmol/L — ABNORMAL LOW (ref 22–32)
TCO2: 19 mmol/L — ABNORMAL LOW (ref 22–32)
pCO2 arterial: 32.2 mmHg (ref 32–48)
pCO2 arterial: 32.3 mmHg (ref 32–48)
pH, Arterial: 7.348 — ABNORMAL LOW (ref 7.35–7.45)
pH, Arterial: 7.346 — ABNORMAL LOW (ref 7.35–7.45)
pO2, Arterial: 159 mmHg — ABNORMAL HIGH (ref 83–108)
pO2, Arterial: 119 mmHg — ABNORMAL HIGH (ref 83–108)

## 2023-08-09 LAB — PHOSPHORUS
Phosphorus: 4.7 mg/dL — ABNORMAL HIGH (ref 2.5–4.6)
Phosphorus: 4.3 mg/dL (ref 2.5–4.6)

## 2023-08-09 LAB — CBC
HCT: 22.6 % — ABNORMAL LOW (ref 39.0–52.0)
Hemoglobin: 7.2 g/dL — ABNORMAL LOW (ref 13.0–17.0)
MCH: 28.5 pg (ref 26.0–34.0)
MCHC: 31.9 g/dL (ref 30.0–36.0)
MCV: 89.3 fL (ref 80.0–100.0)
Platelets: 60 10*3/uL — ABNORMAL LOW (ref 150–400)
RBC: 2.53 MIL/uL — ABNORMAL LOW (ref 4.22–5.81)
RDW: 15.9 % — ABNORMAL HIGH (ref 11.5–15.5)
WBC: 15 10*3/uL — ABNORMAL HIGH (ref 4.0–10.5)
nRBC: 0 % (ref 0.0–0.2)

## 2023-08-09 LAB — CBC WITH DIFFERENTIAL/PLATELET
Abs Immature Granulocytes: 0.58 10*3/uL — ABNORMAL HIGH (ref 0.00–0.07)
Basophils Absolute: 0.1 10*3/uL (ref 0.0–0.1)
Basophils Relative: 0 %
Eosinophils Absolute: 0 10*3/uL (ref 0.0–0.5)
Eosinophils Relative: 0 %
HCT: 25.5 % — ABNORMAL LOW (ref 39.0–52.0)
Hemoglobin: 8.4 g/dL — ABNORMAL LOW (ref 13.0–17.0)
Immature Granulocytes: 3 %
Lymphocytes Relative: 14 %
Lymphs Abs: 2.6 10*3/uL (ref 0.7–4.0)
MCH: 28.9 pg (ref 26.0–34.0)
MCHC: 32.9 g/dL (ref 30.0–36.0)
MCV: 87.6 fL (ref 80.0–100.0)
Monocytes Absolute: 3.3 10*3/uL — ABNORMAL HIGH (ref 0.1–1.0)
Monocytes Relative: 17 %
Neutro Abs: 12.4 10*3/uL — ABNORMAL HIGH (ref 1.7–7.7)
Neutrophils Relative %: 66 %
Platelets: 71 10*3/uL — ABNORMAL LOW (ref 150–400)
RBC: 2.91 MIL/uL — ABNORMAL LOW (ref 4.22–5.81)
RDW: 15.9 % — ABNORMAL HIGH (ref 11.5–15.5)
Smear Review: DECREASED
WBC: 18.9 10*3/uL — ABNORMAL HIGH (ref 4.0–10.5)
nRBC: 0 % (ref 0.0–0.2)

## 2023-08-09 LAB — COMPREHENSIVE METABOLIC PANEL WITH GFR
ALT: 240 U/L — ABNORMAL HIGH (ref 0–44)
AST: 172 U/L — ABNORMAL HIGH (ref 15–41)
Albumin: 1.5 g/dL — ABNORMAL LOW (ref 3.5–5.0)
Alkaline Phosphatase: 195 U/L — ABNORMAL HIGH (ref 38–126)
Anion gap: 8 (ref 5–15)
BUN: 46 mg/dL — ABNORMAL HIGH (ref 8–23)
CO2: 19 mmol/L — ABNORMAL LOW (ref 22–32)
Calcium: 7.6 mg/dL — ABNORMAL LOW (ref 8.9–10.3)
Chloride: 107 mmol/L (ref 98–111)
Creatinine, Ser: 1.27 mg/dL — ABNORMAL HIGH (ref 0.61–1.24)
GFR, Estimated: 59 mL/min — ABNORMAL LOW (ref >60)
Glucose, Bld: 164 mg/dL — ABNORMAL HIGH (ref 70–99)
Potassium: 4.4 mmol/L (ref 3.5–5.1)
Sodium: 134 mmol/L — ABNORMAL LOW (ref 135–145)
Total Bilirubin: 2.8 mg/dL — ABNORMAL HIGH (ref 0.0–1.2)
Total Protein: 3.9 g/dL — ABNORMAL LOW (ref 6.5–8.1)

## 2023-08-09 LAB — MAGNESIUM
Magnesium: 1.7 mg/dL (ref 1.7–2.4)
Magnesium: 1.8 mg/dL (ref 1.7–2.4)
Magnesium: 1.8 mg/dL (ref 1.7–2.4)

## 2023-08-09 LAB — RENAL FUNCTION PANEL
Albumin: 1.6 g/dL — ABNORMAL LOW (ref 3.5–5.0)
Anion gap: 12 (ref 5–15)
BUN: 49 mg/dL — ABNORMAL HIGH (ref 8–23)
CO2: 16 mmol/L — ABNORMAL LOW (ref 22–32)
Calcium: 7.8 mg/dL — ABNORMAL LOW (ref 8.9–10.3)
Chloride: 106 mmol/L (ref 98–111)
Creatinine, Ser: 1.46 mg/dL — ABNORMAL HIGH (ref 0.61–1.24)
GFR, Estimated: 50 mL/min — ABNORMAL LOW (ref >60)
Glucose, Bld: 157 mg/dL — ABNORMAL HIGH (ref 70–99)
Phosphorus: 4.5 mg/dL (ref 2.5–4.6)
Potassium: 4.3 mmol/L (ref 3.5–5.1)
Sodium: 134 mmol/L — ABNORMAL LOW (ref 135–145)

## 2023-08-09 LAB — LACTIC ACID, PLASMA
Lactic Acid, Venous: 5.4 mmol/L (ref 0.5–1.9)
Lactic Acid, Venous: 4.5 mmol/L (ref 0.5–1.9)
Lactic Acid, Venous: 4.8 mmol/L (ref 0.5–1.9)
Lactic Acid, Venous: 4.8 mmol/L (ref 0.5–1.9)

## 2023-08-09 LAB — TRIGLYCERIDES: Triglycerides: 264 mg/dL — ABNORMAL HIGH (ref <150)

## 2023-08-09 LAB — BRAIN NATRIURETIC PEPTIDE: B Natriuretic Peptide: 73.5 pg/mL (ref 0.0–100.0)

## 2023-08-09 LAB — PROCALCITONIN: Procalcitonin: 4.87 ng/mL

## 2023-08-09 LAB — HEPARIN LEVEL (UNFRACTIONATED)
Heparin Unfractionated: 0.28 [IU]/mL — ABNORMAL LOW (ref 0.30–0.70)
Heparin Unfractionated: 0.28 [IU]/mL — ABNORMAL LOW (ref 0.30–0.70)

## 2023-08-09 LAB — C-REACTIVE PROTEIN: CRP: 24.7 mg/dL — ABNORMAL HIGH (ref <1.0)

## 2023-08-09 LAB — GLUCOSE, CAPILLARY
Glucose-Capillary: 103 mg/dL — ABNORMAL HIGH (ref 70–99)
Glucose-Capillary: 134 mg/dL — ABNORMAL HIGH (ref 70–99)
Glucose-Capillary: 160 mg/dL — ABNORMAL HIGH (ref 70–99)
Glucose-Capillary: 182 mg/dL — ABNORMAL HIGH (ref 70–99)
Glucose-Capillary: 183 mg/dL — ABNORMAL HIGH (ref 70–99)
Glucose-Capillary: 200 mg/dL — ABNORMAL HIGH (ref 70–99)

## 2023-08-09 LAB — PROTIME-INR
INR: 1.4 — ABNORMAL HIGH (ref 0.8–1.2)
Prothrombin Time: 16.9 s — ABNORMAL HIGH (ref 11.4–15.2)

## 2023-08-09 LAB — HEMOGLOBIN AND HEMATOCRIT, BLOOD
HCT: 24.8 % — ABNORMAL LOW (ref 39.0–52.0)
Hemoglobin: 8 g/dL — ABNORMAL LOW (ref 13.0–17.0)

## 2023-08-09 MED ORDER — VASOPRESSIN 20 UNITS/100 ML INFUSION FOR SHOCK
INTRAVENOUS | Status: AC
Start: 2023-08-09 — End: 2023-08-09
  Administered 2023-08-09: 0.03 [IU]/min via INTRAVENOUS
  Filled 2023-08-09: qty 100

## 2023-08-09 MED ORDER — MEDIHONEY WOUND/BURN DRESSING EX PSTE
1.0000 | PASTE | Freq: Every day | CUTANEOUS | Status: DC
  Administered 2023-08-09 – 2023-08-14 (×6): 1 via TOPICAL
  Filled 2023-08-09 (×2): qty 44

## 2023-08-09 MED ORDER — VASOPRESSIN 20 UNITS/100 ML INFUSION FOR SHOCK
0.0000 [IU]/min | INTRAVENOUS | Status: DC
Start: 2023-08-09 — End: 2023-08-15
  Administered 2023-08-09 – 2023-08-11 (×6): 0.03 [IU]/min via INTRAVENOUS
  Administered 2023-08-12: 0.04 [IU]/min via INTRAVENOUS
  Administered 2023-08-12: 0.03 [IU]/min via INTRAVENOUS
  Administered 2023-08-12 – 2023-08-15 (×4): 0.04 [IU]/min via INTRAVENOUS
  Filled 2023-08-09 (×12): qty 100

## 2023-08-09 MED ORDER — VANCOMYCIN HCL 1500 MG/300ML IV SOLN
1500.0000 mg | Freq: Once | INTRAVENOUS | Status: AC
  Administered 2023-08-09: 1500 mg via INTRAVENOUS
  Filled 2023-08-09: qty 300

## 2023-08-09 MED ORDER — PROSOURCE TF20 ENFIT COMPATIBL EN LIQD
60.0000 mL | Freq: Two times a day (BID) | ENTERAL | Status: DC
  Administered 2023-08-09 – 2023-08-14 (×10): 60 mL
  Filled 2023-08-09 (×10): qty 60

## 2023-08-09 MED ORDER — VITAL HIGH PROTEIN PO LIQD
1000.0000 mL | ORAL | Status: DC
  Administered 2023-08-09: 1000 mL

## 2023-08-09 MED ORDER — MAGNESIUM SULFATE 2 GM/50ML IV SOLN
2.0000 g | Freq: Once | INTRAVENOUS | Status: AC
  Administered 2023-08-09: 2 g via INTRAVENOUS
  Filled 2023-08-09: qty 50

## 2023-08-09 MED ORDER — PROSOURCE TF20 ENFIT COMPATIBL EN LIQD
60.0000 mL | Freq: Every day | ENTERAL | Status: DC
  Administered 2023-08-09: 60 mL
  Filled 2023-08-09: qty 60

## 2023-08-09 MED ORDER — DEXMEDETOMIDINE HCL IN NACL 400 MCG/100ML IV SOLN
0.0000 ug/kg/h | INTRAVENOUS | Status: DC
  Administered 2023-08-09: 0.4 ug/kg/h via INTRAVENOUS
  Administered 2023-08-10: 0.2 ug/kg/h via INTRAVENOUS
  Administered 2023-08-10: 0.4 ug/kg/h via INTRAVENOUS
  Filled 2023-08-09 (×4): qty 100

## 2023-08-09 MED ORDER — LACTATED RINGERS IV BOLUS
1000.0000 mL | Freq: Once | INTRAVENOUS | Status: AC
  Administered 2023-08-09: 1000 mL via INTRAVENOUS

## 2023-08-09 MED ORDER — HEPARIN (PORCINE) 25000 UT/250ML-% IV SOLN
1900.0000 [IU]/h | INTRAVENOUS | Status: DC
  Administered 2023-08-09: 1050 [IU]/h via INTRAVENOUS
  Administered 2023-08-10: 1100 [IU]/h via INTRAVENOUS
  Administered 2023-08-11: 1400 [IU]/h via INTRAVENOUS
  Administered 2023-08-12: 1700 [IU]/h via INTRAVENOUS
  Filled 2023-08-09 (×4): qty 250

## 2023-08-09 MED ORDER — PROSOURCE TF20 ENFIT COMPATIBL EN LIQD: 60.0000 mL | Freq: Every day | ENTERAL | Status: DC

## 2023-08-09 MED ORDER — VANCOMYCIN HCL 1250 MG/250ML IV SOLN
1250.0000 mg | INTRAVENOUS | Status: DC
  Administered 2023-08-10 – 2023-08-12 (×3): 1250 mg via INTRAVENOUS
  Filled 2023-08-09 (×4): qty 250

## 2023-08-09 MED ORDER — OSMOLITE 1.5 CAL PO LIQD
1000.0000 mL | ORAL | Status: DC
  Administered 2023-08-12 – 2023-08-14 (×3): 1000 mL

## 2023-08-09 NOTE — Procedures (Signed)
 Arterial Catheter Insertion Procedure Note  Kenneth [name]  [id number]  08/25/1950  Date:08/09/23  Time:1:17 AM    Provider Performing: Darcella Gasman Cree Napoli    Procedure: Insertion of Arterial Line (40981) with US guidance (19147)   Indication(s) Blood pressure monitoring and/or need for frequent ABGs  Consent Unable to obtain consent due to emergent nature of procedure.  Anesthesia None   Time Out Verified patient identification, verified procedure, site/side was marked, verified correct patient position, special equipment/implants available, medications/allergies/relevant history reviewed, required imaging and test results available.   Sterile Technique Maximal sterile technique including full sterile barrier drape, hand hygiene, sterile gown, sterile gloves, mask, hair covering, sterile ultrasound probe cover (if used).   Procedure Description Area of catheter insertion was cleaned with chlorhexidine and draped in sterile fashion. With real-time ultrasound guidance an arterial catheter was placed into the right femoral artery.  Appropriate arterial tracings confirmed on monitor.     Complications/Tolerance None; patient tolerated the procedure well.   EBL Minimal   Specimen(s) None  Darcella Gasman Shulamis Wenberg, PA-C

## 2023-08-09 NOTE — Progress Notes (Addendum)
 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   On ED arrival  pt was afebrile, HR:63, BP: 104/47, RR 23. Labs were notable for WBC 7.9, Hgb: 9.0( baseline around 12), Platelets 87, Na: 132, K: 4.1, CO2: 21, Cr, 0.87( Baseline), Transaminases/ ALK Phos WNL, T.bili elevated at 1.9, Mg: 1.4. Troponin : 5--> 4, BNP: 33. FOBT negative, Iron studies consistent with deficiency, B12 low. TSH/T4 WNL. UA grossly normal. RVP negative. CXR no acute, chronic right base changes.  Admitted to [facility]. GI was consulted for anemia in the setting of DAPT with recommendation for BID PPI and EGD to r/o occult GIB. EGD completed 3/23 with mild gastritis, no evidence of active bleeding. Neurology consulted(3/25) for new onset confusion. CT head completed 3/25 with NAICA, new small chronic cortical/ subcortical infarct R frontal lobe ( new from prior), known small chronic infarcts, L frontal/ L parietal/ R temporal lobes. EEG (3/25) showed mild diffuse encephalopathy, no seizures. F/u MRI brain (3/25) showed many small acute infarcts concerning for embolic etiology. MRI C-Spine with severe canal stenosis concerning for edema and compressive myelopathy, severe left/moderate right foraminal stenosis, edema of left T1 inferior articular facet. MRI T-spine negative. Given patients progressive weakness in the setting of known C-Spine compression, NSG consulted for  evaluation 3/26 with recommendation for eventual cord decompression once optimized from a medical standpoint.Additionally, psychiatry was consulted for recommendations on medication management for depressed mood. Cardiology was consulted 3/27 for preoperative cardiac risk stratification.  On 3/28AM, patient was reportedly hot to touch per RN with increased RR and WOB and Tmax 103F. CXR obtained 3/28 stable . WBC slightly elevated to 10.8 , INR 1.4, K 3.3, CO2 21. PCT 1.35 ( 0.65), LA 2.2 with repeat >9.   PCCM consulted for concern for developing sepsis, acute liver injury.  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 CT Head/MRI Brain obtained. MRI C-spine and T-spine completed. 3/26 - NSGY evaluation for ?decompression surgery.  3/27 - Cards consult for preoperative risk stratification for possible cord decompression  surgery, tentatively 3/31. 3/28 - Early AM, febrile to Tmax 103F with increased RR/WOB and ?aspiration; LA 2.2 with mild WBC elevation to 10.8; CXR stable. LA > 9 prompted PCCM consult (aberrant value, repeat 2.1). LFTs elevated. Worsening mental status/drowsiness. 3/29  Worsening mental status. Intubated for airway protection. MRI with new 5 mm acute infarct in left pons and numerous acute/subacute infarcts without interval progression  Interim History / Subjective:  Yesterday Worsening mental status. Intubated for airway protection. MRI with new 5 mm acute infarct in left pons and numerous acute/subacute infarcts without interval progression  Overnight worsening vasopressor requirement. Given 1L IVF and vasopressin added to levophed Objective:  Blood pressure (!) 131/53, pulse 70, temperature 100.3 F (37.9 C), temperature source Axillary, resp. rate 18, height 5' 10.98" (1.803 m), weight 86.6 kg, SpO2 100%.    Vent Mode: PRVC FiO2 (%):  [40 %-100 %] 40 % Set Rate:  [18 bmp] 18 bmp Vt Set:  [600 mL] 600 mL PEEP:  [5 cmH20] 5 cmH20 Plateau Pressure:  [10 cmH20-17 cmH20] 10 cmH20   Intake/Output Summary (Last 24 hours) at 08/09/2023 0728 Last data filed at 08/09/2023 0600 Gross per 24 hour  Intake 6410.22 ml  Output 200 ml  Net 6210.22 ml   Filed Weights   08/01/23 0628  Weight: 86.6 kg   Physical Exam: General: Critically and chronically ill-appearing,sedated HENT: [place], AT, ETT in place Eyes: EOMI, no scleral icterus Respiratory: Diminished to auscultation bilaterally.  No crackles, wheezing or rales Cardiovascular: RRR, -M/R/G, no JVD GI: BS+, soft, nontender Extremities:-Edema,-tenderness Neuro: Sedated, PERRL, CNII-XII grossly intact  Imaging, labs and test in EMR in the last 24 hours reviewed independently by me. 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 TCD bubble study 2/15 with "small, clinically insignificant PFO". Loop recorder negative for  arrhythmia/A-Fib Hypercoagulable state: Compound heterozygous state for factor V Leiden mutation and prothrombin gene mutation and low protein S levels - Failed Plavix and Brilinta Plan: - Stroke team following. Appreciate input. ASA and DOAC for long term. Will start heparin while inpatient  - Hematology following. - Neuro checks - Neuroprotective measures: HOB > 30 degrees, normoglycemia, normothermia, electrolytes WNL  Acute hypoxemic respiratory failure secondary to aspiration in setting of recurrent strokes -Full vent support -LTVV, 4-8cc/kg IBW with goal Pplat<30 and DP<15 -VAP and PAD protocol for RASS goal -1 -ABG and CXR PRN  Septic shock secondary aspiration PNA, sedation related hypotension, sacral ulcer wound 1 of 4 blood cx bottles growing MRSE, which is typically a contaminant  - Wean levophed and vasopressin for goal SBP >90 and MAP > 60 - Trend WBC, fever curve, LA - Change Abx to Vanc and Zosyn to broaden GN coverage. F/u Cx data  Elevated LFTs - improved Differential includes embolic concern (PVT versus Budd-Chiari, given history of clotting disorder), DILI, acute hepatitis. CTA Abdomen/Pelvis neg for acute pathology, stable mild left UPJ obstruction. Acute hepatitis panel neg. 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 MRI T-spine negative. - NSGY consulted, following - Tentative plan for cord decompression pending medical surgical  clearance; on hold at this time due to clinical decompensation - PT/OT when clinically appropriate  Anemia, multifactorial in the setting of critical illness, B12 deficiency, iron deficiency - Trend H&H, Plt, INR - Monitor for signs of active bleeding - Transfuse for Hgb < 7.0 or hemodynamically significant bleeding - B12/folate/thiamine supplementation  T2DM - SSI - CBGs Q4H while NPO - Goal CBG 140-180  History of diverticulosis - Noted history  Best Practice: (right click and "Reselect all SmartList Selections" daily)   Diet/type: tubefeeds DVT prophylaxis: systemic heparin GI prophylaxis: PPI Lines: N/A Foley:  N/A Code Status:  full code Last date of multidisciplinary goals of care discussion [3/29] Confirmed code status with wife today  Critical care time: 38 min   The patient is critically ill with multiple organ systems failure and requires high complexity decision making for assessment and support, frequent evaluation and titration of therapies, application of advanced monitoring technologies and extensive interpretation of multiple databases.  Independent Critical Care Time: 38 Minutes.   [name], M.D. [facility] Pulmonary/Critical Care Medicine 08/09/2023 7:28 AM   Please see Amion for pager number to reach on-call Pulmonary and Critical Care Team.

## 2023-08-09 NOTE — Progress Notes (Addendum)
 Pharmacy Antibiotic Note  Kenneth [name] is a 73 y.o. male admitted on 08/01/2023 with pneumonia and wound infection.  Pharmacy has been consulted for Vancomycin & Zosyn dosing.  Tmax 102.5, WBC 18.9, L acid 4.5,  aspiration pneumonia and wound infection per CCM.  Scr rose to 1.46 and now decreased to 1.27. 1100 mL UOP in last 24 hours.   Plan: Continue Zosyn 3.375 g EI q8h  Restart vancomycin - 1500 mg loading dose followed by 1250 mg q24h (Scr used 1.46) to obtain AUC 472 (remains above 400 if 1.27 used for Scr).  Follow cultures and length of therapy   Height: 5' 10.98" (180.3 cm) Weight: 86.6 kg (191 lb) IBW/kg (Calculated) : 75.26  Temp (24hrs), Avg:100.5 F (38.1 C), Min:98.7 F (37.1 C), Max:102.5 F (39.2 C)  Recent Labs  Lab 08/06/23 0018 08/06/23 0509 08/07/23 0243 08/07/23 0601 08/07/23 0740 08/07/23 1403 08/07/23 1613 08/07/23 1901 08/08/23 0635 08/09/23 0107 08/09/23 0533  WBC 9.8 9.2  --  10.8*  --   --   --   --  10.6*  --  18.9*  CREATININE 0.94 0.96 1.16  --   --   --  1.10  --  1.09 1.46* 1.27*  LATICACIDVEN  --   --   --  2.2*   < > >9.0* 2.1* 2.3*  --  5.4* 4.5*   < > = values in this interval not displayed.    Estimated Creatinine Clearance: 54.4 mL/min (A) (by C-G formula based on SCr of 1.27 mg/dL (H)).    Allergies  Allergen Reactions   Jardiance [Empagliflozin] Other (See Comments)    To the hospital- due to complications   Fluoxetine Other (See Comments)    Prefers to NOT take this   Penicillins Other (See Comments)    Tolerated Unasyn March 2025   Silodosin Other (See Comments)    Vertigo     3/28 Blood cx- MRSE  3/28 MRSA nares neg  3/30 tracheal aspirate sent   Thank you for allowing pharmacy to be a part of this patient's care.  Cedric Fishman, PharmD, BCPS, BCCCP Clinical Pharmacist

## 2023-08-09 NOTE — Progress Notes (Signed)
 eLink Physician-Brief Progress Note Patient Name: BRADY SCHILLER DOB: 1951/04/05 MRN: [id number]   Date of Service  08/09/2023  HPI/Events of Note  RN reports a drop in BP with any type of movement on the arterial line.  At one point, she reports a 50 point drop while turning the patient.  eICU Interventions  On further questioning, the monometer for the arterial line is attached to patient's arm.  That is the reason why there are blood pressure changes anytime patient is turned.  Urine output has remained relatively constant throughout the night which means he is adequately perfusing his kidneys.  Recommend placing the monometer on the nonmobile surface to avoid these fluctuations.  Patient has also developed anemia with a hemoglobin of 7.2 while on heparin.  Platelet count which was already low has dropped even further to 60.  Discussed with pharmacy.  Will repeat a CBC and if numbers continue to decline, we might have to put a stop to the heparin infusion. RN aware of both plans.     Intervention Category Major Interventions: Hypotension - evaluation and management  Addendum: Repeat CBC shows a hemoglobin of 7.5 and a platelet count of 64.  Discussed with pharmacist as well as RN.  Will continue heparin titrated per pharmacy protocol.  Carilyn Goodpasture 08/09/2023, 11:44 PM

## 2023-08-09 NOTE — Consult Note (Signed)
 WOC Nurse Consult Note: WOc team previous consulted on stage 2 sacral that initially presented as irritant contact dermatitis; has now progressed to Deep Tissue Pressure Injury  Reason for Consult: DTPI  Wound type: Deep Tissue Pressure Injury that is evolving with necrotic tissue noted medial buttock  Pressure Injury POA: Irritant Contact Dermatitis on admission has now progressed to DTPI  Measurement: see nursing flowsheet  Wound bed: purple maroon discoloration that is evolving with some necrotic tissue noted medial buttock  Drainage (amount, consistency, odor) see nursing flowsheet  Periwound: mild erythema  Dressing procedure/placement/frequency: Cleanse entire sacrum/coccyx/buttocks with Vashe wound cleanser Hart [place] [phone]), do not rinse and allow to air dry.  Cover necrotic tissue medial buttock with Medihoney and dry gauze.  Apply Xeroform gauze Hart [place] [phone]) to all areas of purple maroon discoloration.  Cover with abd pad and tape or silicone foam.   POC discussed with bedside nurse. WOC team will follow weekly to evaluate wound and change POC as needed.   Patient should remain on a low air loss mattress throughout hospitalization.   Thank you    Priscella Mann MSN, RN-BC, [employer] [phone]

## 2023-08-09 NOTE — Progress Notes (Signed)
 Nutrition Follow-up  DOCUMENTATION CODES:   Severe malnutrition in context of chronic illness  INTERVENTION:  Tube feeds via NG-tube: Once current TF is complete, start Osmolite 1.5 at 40 mL/hr and advance by 10 mL q6h to goal rate of 60 mL/hr (1440 mL per day) 60 mL ProSource TF20 - BID Tube feed regimen provides 2320 kcal, 130 gm protein, and 1097 mL free water daily. Continue folic acid and thiamine daily  NUTRITION DIAGNOSIS:   Severe Malnutrition related to chronic illness as evidenced by percent weight loss, moderate fat depletion, moderate muscle depletion. - Ongoing   GOAL:   Patient will meet greater than or equal to 90% of their needs - Ongoing, being addressed by TF  MONITOR:   Diet advancement  REASON FOR ASSESSMENT:   Malnutrition Screening Tool    ASSESSMENT:   PMH: CVA in May 2024, OSA, T2DM, HTN, prostate cancer,   Admitted with symptomatic anemia. Pt presents with metabolic encephalopathy, generalized weakness. Underlying depression.  3/22 - Admitted 3/28 - NPO 3/29 - Transferred to ICU; Intubated   RD working remotely at time of evaluation. RD received a consult for tube feeds. Discussed with RN, Vital HP currently infusing. Discussed changing once current bottle is complete.  Meal Intakes 3/23 - 10-50% x 2 meals 3/25 - 0-10% x 3 meals 3/26 - 10-25% 2 meals  Patient is currently intubated on ventilator support. MV: 13.4 L/min MAP (a-line): 63 Temp (24hrs), Avg:100.5 F (38.1 C), Min:98.7 F (37.1 C), Max:102.5 F (39.2 C)  Drips Fentanyl Heparin Levophed Vasopressin  Medications reviewed and include: Vitamin B12, Colace, Ferrous Sulfate, Folic Acid, NovoLog SSI, Semglee, Remeron, Protonix, Miralax, Thiamine, IV antibiotics Labs reviewed: Sodium 134, Potassium 4.4, BUN 46, Creatinine 1.27, Phosphorus 4.7, Magnesium 1.8 CBG: 103-160 x 24 hrs  Diet Order:   Diet Order             Diet NPO time specified Except for: Sips with Meds  Diet  effective now                   EDUCATION NEEDS:   Education needs have been addressed  Skin:  Skin Assessment: Reviewed RN Assessment Per WOC note on 3/30 - DTPI on buttock  Last BM:  3/27  Height:  Ht Readings from Last 1 Encounters:  08/08/23 5' 10.98" (1.803 m)   Weight:  Wt Readings from Last 1 Encounters:  08/01/23 86.6 kg   Ideal Body Weight:  77.7 kg  BMI:  Body mass index is 26.65 kg/m.  Estimated Nutritional Needs:  Kcal:  2200-2600 Protein:  120-140 Fluid:  1 mL/kcal or per MD   Kirby Crigler RD, LDN Clinical Dietitian

## 2023-08-09 NOTE — Progress Notes (Signed)
 PHARMACY - ANTICOAGULATION CONSULT NOTE  Pharmacy Consult for heparin Indication: stroke  Allergies  Allergen Reactions   Jardiance [Empagliflozin] Other (See Comments)    To the hospital- due to complications   Fluoxetine Other (See Comments)    Prefers to NOT take this   Penicillins Other (See Comments)    Tolerated Unasyn March 2025   Silodosin Other (See Comments)    Vertigo     Patient Measurements: Height: 5' 10.98" (180.3 cm) Weight: 86.6 kg (191 lb) IBW/kg (Calculated) : 75.26 HEPARIN DW (KG): 86.6  Vital Signs: Temp: 99.4 F (37.4 C) (03/30 0754) Temp Source: Axillary (03/30 0754) BP: 115/72 (03/30 0900) Pulse Rate: 88 (03/30 0915)  Labs: Recent Labs    08/07/23 0601 08/07/23 1613 08/08/23 1478 08/08/23 0812 08/08/23 1152 08/09/23 0107 08/09/23 0114 08/09/23 0533  HGB 8.5*  --  8.1*  --  7.8*  --  7.8* 8.4*  HCT 24.9*  --  23.6*  --  23.0*  --  23.0* 25.5*  PLT 91*  --  89*  --   --   --   --  71*  LABPROT  --  16.4*  --  17.1*  --   --   --  16.9*  INR  --  1.3*  --  1.4*  --   --   --  1.4*  CREATININE  --  1.10 1.09  --   --  1.46*  --  1.27*    Estimated Creatinine Clearance: 54.4 mL/min (A) (by C-G formula based on SCr of 1.27 mg/dL (H)).   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   Hyperlipidemia    Hypertension    Iron deficiency anemia, unspecified    OSA (obstructive sleep apnea)    refusing CPAP   Pneumonia 03/2022   w/effusion   Prostate cancer (HCC)    Protein S deficiency (HCC)    Stroke (HCC)     Medications:  Scheduled:   aspirin  81 mg Per Tube Daily   Chlorhexidine Gluconate Cloth  6 each  Topical Q0600   vitamin B-12  1,000 mcg Per Tube Daily   docusate  100 mg Per Tube BID   feeding supplement (PROSource TF20)  60 mL Per Tube Daily   feeding supplement (VITAL HIGH PROTEIN)  1,000 mL Per Tube Q24H   fentaNYL (SUBLIMAZE) injection  25 mcg Intravenous Once   ferrous sulfate  300 mg Per Tube Daily   folic acid  1 mg Per Tube Daily   Gerhardt's butt cream   Topical BID   heparin injection (subcutaneous)  5,000 Units Subcutaneous Q8H   insulin aspart  0-15 Units Subcutaneous Q4H   insulin glargine-yfgn  30 Units Subcutaneous Q1400   mirtazapine  7.5 mg Per Tube QHS   mouth rinse  15 mL Mouth Rinse Q2H   pantoprazole (PROTONIX) IV  40 mg Intravenous QHS   polyethylene glycol  17 g Per Tube Daily   rocuronium  100 mg Intravenous Once   sodium chloride flush  10-40 mL Intracatheter Q12H   thiamine  100 mg Per Tube q  AM   Infusions:   sodium chloride     fentaNYL infusion INTRAVENOUS 50 mcg/hr (08/09/23 0900)   norepinephrine (LEVOPHED) Adult infusion 18 mcg/min (08/09/23 0900)   piperacillin-tazobactam (ZOSYN)  IV Stopped (08/09/23 0644)   propofol (DIPRIVAN) infusion 10 mcg/kg/min (08/09/23 0900)   vasopressin 0.03 Units/min (08/09/23 0900)    Assessment: Patient with PMH of stroke (on DAPT), dyslipidemia, BPH, prostate cancer, insulin-dependent DM type II, hypertension, cervical stenosis, history of iron deficiency anemia, history of OSA does not wear CPAP, recent history of right ankle strain, and 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   Was admitted 3/22 for progressive weakness. 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 Pharmacy consulted to dose heparin 3/30.  Hb 8.4, Plt 71. No bolus given stroke and borderline platelets, will use lower goal.   Goal of Therapy:  Heparin level 0.3-0.5 units/ml Monitor platelets by  anticoagulation protocol: Yes   Plan:  Start heparin infusion at 1050 units/hr Check anti-Xa level in 8 hours and daily while on heparin Continue to monitor H&H and platelets  Kenneth [name] 08/09/2023,10:49 AM

## 2023-08-09 NOTE — Progress Notes (Signed)
 STROKE TEAM PROGRESS NOTE   INTERIM HISTORY/SUBJECTIVE Wife and sister are at the bedside.  Patient is still intubated but on weaning trial. Off propofol and paused fentanyl, pt was able to open eyes with voice and seemed wiggled toes with command. But very lethargic and not able to have full access. Still has thrombocytopenia but OK to start heparin IV after discussed with wife.    OBJECTIVE  CBC    Component Value Date/Time   WBC 18.9 (H) 08/09/2023 0533   RBC 2.91 (L) 08/09/2023 0533   HGB 8.4 (L) 08/09/2023 0533   HGB 12.3 (L) 05/01/2023 1204   HCT 25.5 (L) 08/09/2023 0533   PLT 71 (L) 08/09/2023 0533   PLT 249 05/01/2023 1204   MCV 87.6 08/09/2023 0533   MCH 28.9 08/09/2023 0533   MCHC 32.9 08/09/2023 0533   RDW 15.9 (H) 08/09/2023 0533   LYMPHSABS 2.6 08/09/2023 0533   MONOABS 3.3 (H) 08/09/2023 0533   EOSABS 0.0 08/09/2023 0533   BASOSABS 0.1 08/09/2023 0533    BMET    Component Value Date/Time   NA 134 (L) 08/09/2023 0533   NA 142 04/06/2017 1057   K 4.4 08/09/2023 0533   CL 107 08/09/2023 0533   CO2 19 (L) 08/09/2023 0533   GLUCOSE 164 (H) 08/09/2023 0533   BUN 46 (H) 08/09/2023 0533   BUN 13 04/06/2017 1057   CREATININE 1.27 (H) 08/09/2023 0533   CREATININE 0.94 05/01/2023 1204   CALCIUM 7.6 (L) 08/09/2023 0533   GFRNONAA 59 (L) 08/09/2023 0533   GFRNONAA >60 05/01/2023 1204    IMAGING past 24 hours VAS [place] LOWER EXTREMITY VENOUS (DVT) Result Date: 08/09/2023  Lower Venous DVT Study Patient Name:  Kenneth [name]  Date of Exam:   08/09/2023 Medical Rec #: [id number]         Accession #:    [id number] Date of Birth: June 22, 1950         Patient Gender: M Patient Age:   73 years Exam Location:  [facility] Procedure:      VAS [place] LOWER EXTREMITY VENOUS (DVT) Referring Phys: Scheryl Marten Mccrae Speciale --------------------------------------------------------------------------------  Indications: Embolic stroke, prostate cancer, weakness. Other Indications: Factor V Leiden and  prothrombin gene mutation. Comparison Study: Previous study on 5.30.2024. Performing Technologist: Fernande Bras  Examination Guidelines: A complete evaluation includes B-mode imaging, spectral Doppler, color Doppler, and power Doppler as needed of all accessible portions of each vessel. Bilateral testing is considered an integral part of a complete examination. Limited examinations for reoccurring indications may be performed as noted. The reflux portion of the exam is performed with the patient in reverse Trendelenburg.  +---------+---------------+---------+-----------+----------+-------------------+ RIGHT    CompressibilityPhasicitySpontaneityPropertiesThrombus Aging      +---------+---------------+---------+-----------+----------+-------------------+ CFV      Full           Yes      Yes                                      +---------+---------------+---------+-----------+----------+-------------------+ SFJ      Full           Yes      Yes                                      +---------+---------------+---------+-----------+----------+-------------------+ FV Prox  Full                                                             +---------+---------------+---------+-----------+----------+-------------------+  FV Mid   Full                                                             +---------+---------------+---------+-----------+----------+-------------------+ FV DistalFull                                                             +---------+---------------+---------+-----------+----------+-------------------+ PFV      Full                                                             +---------+---------------+---------+-----------+----------+-------------------+ POP      Full           Yes      Yes                                      +---------+---------------+---------+-----------+----------+-------------------+ PTV                                                    Not well                                                                  visualized, patent                                                        by color.           +---------+---------------+---------+-----------+----------+-------------------+ PERO                                                  Not well                                                                  visualized, patent  by color.           +---------+---------------+---------+-----------+----------+-------------------+ Gastroc  Partial        No       No                                       +---------+---------------+---------+-----------+----------+-------------------+   +---------+---------------+---------+-----------+----------+--------------+ LEFT     CompressibilityPhasicitySpontaneityPropertiesThrombus Aging +---------+---------------+---------+-----------+----------+--------------+ CFV      Full           Yes      Yes                                 +---------+---------------+---------+-----------+----------+--------------+ SFJ      Full           Yes      Yes                                 +---------+---------------+---------+-----------+----------+--------------+ FV Prox  Full                                                        +---------+---------------+---------+-----------+----------+--------------+ FV Mid   Full                                                        +---------+---------------+---------+-----------+----------+--------------+ FV DistalFull                                                        +---------+---------------+---------+-----------+----------+--------------+ PFV      Full                                                        +---------+---------------+---------+-----------+----------+--------------+ POP      Full           Yes       Yes                                 +---------+---------------+---------+-----------+----------+--------------+ PTV      Full                                                        +---------+---------------+---------+-----------+----------+--------------+ PERO     Full                                                        +---------+---------------+---------+-----------+----------+--------------+  Summary: RIGHT: 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 - No cystic structure found in the popliteal fossa.  LEFT: - There is no evidence of deep vein thrombosis in the lower extremity.  - No cystic structure found in the popliteal fossa.  *See table(s) above for measurements and observations.    Preliminary    MR BRAIN WO CONTRAST Result Date: 08/08/2023 CLINICAL DATA:  altered mental status, concerned for recurrent strokes; Stroke, follow up. EXAM: MRI HEAD WITHOUT CONTRAST MRA HEAD WITHOUT CONTRAST TECHNIQUE: Multiplanar, multi-echo pulse sequences of the brain and surrounding structures were acquired without intravenous contrast. Angiographic images of the Circle of Willis were acquired using MRA technique without intravenous contrast. COMPARISON:  Head MRI 08/04/2023.  Head and neck CTA 04/06/2023. FINDINGS: MRI HEAD FINDINGS Brain: A 5 mm acute infarct in the left paracentral pons is new from the prior MRI. Numerous acute to subacute infarcts are again seen involving cortex and white matter in both cerebral hemispheres as well as the dorsal left thalamus and cerebellum. A few of these may be minimally larger or more conspicuous than on the prior MRI, however no sizable new supratentorial infarct is identified. There is up to mild associated cytotoxic edema without mass effect. No intracranial hemorrhage, mass, midline shift, or extra-axial fluid collection is identified. There is mild cerebral atrophy. Vascular: Major intracranial  vascular flow voids are preserved. Skull and upper cervical spine: Unremarkable bone marrow signal. Sinuses/Orbits: Unremarkable orbits. No significant inflammatory disease in the paranasal sinuses. Clear mastoid air cells. Other: None. MRA HEAD FINDINGS Anterior circulation: The internal carotid arteries are widely patent from skull base to carotid termini. ACAs and MCAs are patent without evidence of a proximal branch occlusion or significant proximal stenosis. No aneurysm is identified. Posterior circulation: The included portions of the intracranial vertebral arteries are patent to the basilar. Patent PICA and SCA origins are visualized bilaterally. The basilar artery is widely patent. Posterior communicating arteries are diminutive or absent. Both PCAs are patent without evidence of a significant proximal stenosis. No aneurysm is identified. Anatomic variants: Aplastic right A1 segment. IMPRESSION: 1. 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 2. 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 3. Negative head MRA. Electronically Signed   By: Sebastian Ache M.D.   On: 08/08/2023 16:38   MR ANGIO HEAD WO CONTRAST Result Date: 08/08/2023 CLINICAL DATA:  altered mental status, concerned for recurrent strokes; Stroke, follow up. EXAM: MRI HEAD WITHOUT CONTRAST MRA HEAD WITHOUT CONTRAST TECHNIQUE: Multiplanar, multi-echo pulse sequences of the brain and surrounding structures were acquired without intravenous contrast. Angiographic images of the Circle of Willis were acquired using MRA technique without intravenous contrast. COMPARISON:  Head MRI 08/04/2023.  Head and neck CTA 04/06/2023. FINDINGS: MRI HEAD FINDINGS Brain: A 5 mm acute infarct in the left paracentral pons is new from the prior MRI. Numerous acute to subacute infarcts are again seen involving cortex and white matter in both cerebral hemispheres as well as the dorsal left thalamus and  cerebellum. A few of these may be minimally larger or more conspicuous than on the prior MRI, however no sizable new supratentorial infarct is identified. There is up to mild associated cytotoxic edema without mass effect. No intracranial hemorrhage, mass, midline shift, or extra-axial fluid collection is identified. There is mild cerebral atrophy. Vascular: Major intracranial vascular flow voids are preserved. Skull and upper cervical spine: Unremarkable bone marrow signal. Sinuses/Orbits: Unremarkable orbits.  No significant inflammatory disease in the paranasal sinuses. Clear mastoid air cells. Other: None. MRA HEAD FINDINGS Anterior circulation: The internal carotid arteries are widely patent from skull base to carotid termini. ACAs and MCAs are patent without evidence of a proximal branch occlusion or significant proximal stenosis. No aneurysm is identified. Posterior circulation: The included portions of the intracranial vertebral arteries are patent to the basilar. Patent PICA and SCA origins are visualized bilaterally. The basilar artery is widely patent. Posterior communicating arteries are diminutive or absent. Both PCAs are patent without evidence of a significant proximal stenosis. No aneurysm is identified. Anatomic variants: Aplastic right A1 segment. IMPRESSION: 1. 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 2. 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 3. Negative head MRA. Electronically Signed   By: Sebastian Ache M.D.   On: 08/08/2023 16:38    Vitals:   08/09/23 1126 08/09/23 1130 08/09/23 1145 08/09/23 1202  BP: (!) 119/41 126/78    Pulse: 77 82 81   Resp: (!) 26 (!) 22 (!) 8   Temp:    98.7 F (37.1 C)  TempSrc:    Axillary  SpO2: 100% 100% 100%   Weight:      Height:         PHYSICAL EXAM  Temp:  [98.7 F (37.1 C)-102.5 F (39.2 C)] 98.7 F (37.1 C) (03/30 1202) Pulse Rate:  [64-106] 81 (03/30 1145) Resp:  [0-27]  8 (03/30 1145) BP: (70-172)/(33-108) 126/78 (03/30 1130) SpO2:  [97 %-100 %] 100 % (03/30 1145) Arterial Line BP: (114-161)/(35-54) 116/40 (03/30 1145) FiO2 (%):  [40 %] 40 % (03/30 1126)  General - Well nourished, well developed, paused on sedation.  Ophthalmologic - fundi not visualized due to noncooperation.  Cardiovascular - Regular rate and rhythm.  Neuro - intubated on fentanyl and now paused, eyes closed, but open with repetitive stimulation but not following commands except only possible wiggle toes bilaterally on command. With eye opening, eyes in need position, intermittent blinking to visual threat, and intermittently tracking to provider, pupils equal size light reflexes sluggish. Corneal reflex present, gag and cough present. Breathing over the vent.  Facial symmetry not able to test due to ET tube.  Tongue protrusion not cooperative. On pain stimulation, no movement in bilateral upper extremities, withdraws lower extremities but right more than left. Sensation, coordination and gait not tested.    ASSESSMENT/PLAN  Kenneth [name] is a 73 y.o. male with history of prior history of cryptogenic strokes, diabetes, hyperlipidemia, hypertension, iron deficiency anemia, OSA, prostate cancer admitted for gradually progressive weakness and poor nutrition intake.    Stroke:  b/l small bilateral multifocal embolic infarcts, etiology: possible from primary hypercoagulable state CT head No acute abnormality.  MRI  Many small acute infarcts in the bilateral frontal and parietal lobes, left thalamus, right occipital lobe, and cerebellum MRA iron [place]abbell MRI repeat new small left pontine acute infarct.  Possible small left frontal cortex new infarct.  Stable subacute infarcts seen on the last MRI 2D Echo EF 60-65%.  LA size normal. CT chest abdomen pelvis no evidence of malignancy  Loop recorder interrogation negative for paroxysmal A-fib  LDL 47 HgbA1c 5.3 EEG diffuse slowing.  No  seizure activity VTE prophylaxis - heparin subq aspirin 81 mg daily and Brilinta (ticagrelor) 90 mg bid prior to admission, now on aspirin 81 mg daily.  Started heparin IV after discussion with wife Therapy recommendations: SNF Disposition: Pending  Respiratory failure Aspiration Sepsis / septic shock Intubated for airway protection CCM on board Vent management per CCM On Zosyn, off vancomycin  Hx of Stroke/TIA 09/2022 admitted for bilateral ACA infarct, left more than right. CTA head and neck showed right A3 stenosis. EF 55 to 60%, no DVT. LDL 45, A1c 5.6. Discharged on DAPT and Lipitor 40 and Zetia. Loop recorder placed.  03/2023 admitted for small acute to subacute infarct on the left frontal and left parietal lobes, and right temporal white matter.  CTA head and neck similar diminutive distal right ACA.  EF 60 to 65%.  Loop recorder no A-fib.  LDL 47, A1c 8.2.  Discharged on aspirin and Brilinta for 30 days and then Plavix alone.  Also discharged on Lipitor and Zetia.  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 Dr. Candise Che saw pt again on 3/28 and again recommended anticoagulation Discussed with wife, will start heparin IV today  Cervical myelopathy  MRI C-spine  Severe canal stenosis at C3-C4. Cord T2 hyperintensity superior to this level, suspicious for edema and compressive myelopathy. Severe left and moderate right foraminal stenosis at this level. MRI T-spine Edema involving the left T1 inferior articular facet Neurosurgery consulted, recommend cervical surgery once medically stable.   Continue aspirin 81, has been off brilinta  Hx of hypertension Now hypotension with septic shock Home meds: Lotensin 40 mg Hygroton 25 mg Stable on 2 pressors with Levophed and vasopressin Long-term BP goal  normotensive  Hyperlipidemia Home meds: Atorvastatin 40 mg and Zetia 10 mg LDL 47, goal < 70 Statin and zetia are on hold given acute liver injury Continue statin once LFTs normalize  Diabetes type II Controlled Home meds: Metformin, Toujeo HgbA1c 5.3, goal < 7.0 CBGs SSI Recommend close follow-up with PCP for better DM control  Acute liver injury AST/ALT 515/469--376/361--172/240 INR 1.4--1.3--1.4--1.4 Close monitoring Management per CCM  Anemia and thrombocytopenia Hemoglobin 8.9--8.5--8.1--7.8--8.4 Platelet 97--91--89--71 Continue CBC monitoring closely  Other Stroke Risk Factors Obstructive sleep apnea, not on CPAP at home Advanced age  Other Active Problems BPH Depression, psychiatry on board B12 deficiency Cognitive impairment   Hospital day # 8  This patient is critically ill due to embolic stroke, hypercoagulable state, respiratory failure, and at significant risk of neurological worsening, death form further stroke, hemorrhagic transformation, PE/DVT. This patient's care requires constant monitoring of vital signs, hemodynamics, respiratory and cardiac monitoring, review of multiple databases, neurological assessment, discussion with family, other specialists and medical decision making of high complexity. I spent 40 minutes of neurocritical care time in the care of this patient.  I had long discussion with wife and sister at bedside, updated pt current condition, treatment plan and potential prognosis, and answered all the questions.  They expressed understanding and appreciation. I also discussed with Dr. Everardo All CCM.  Marvel Plan, MD PhD Stroke Neurology 08/09/2023 12:40 PM    To contact Stroke Continuity provider, please refer to [email or web address]. After hours, contact General Neurology

## 2023-08-09 NOTE — Progress Notes (Signed)
 Bilateral lower extremity venous duplex has been completed.  Results can be found in chart review under CV Proc.  08/09/2023 12:22 PM  Fernande Bras, RVT.

## 2023-08-09 NOTE — Progress Notes (Signed)
 Pt requiring more pressure support during shift; PICC verified blood return. E link notified vaso added. Ground team to bedside for A line. Labs sent

## 2023-08-09 NOTE — Progress Notes (Addendum)
 eLink Physician-Brief Progress Note Patient Name: Kenneth [name] DOB: 1950-10-06 MRN: [id number]   Date of Service  08/09/2023  HPI/Events of Note  Progressively worsening shock.  Patient's blood pressure is in the low 70s systolic despite increasing doses of Levophed.  Currently on 27 mcg/min of Levophed.  eICU Interventions  Video assessment of situation and patient done.  With blood pressure cuff moved to the leg, mean arterial pressure increased to 85.  Unable to take blood pressures in the other upper extremity because of IV lines. Discussed with bedside RN and RRT. Will obtain arterial line for real-time hemodynamic monitoring.  Add vasopressin to help decrease norepinephrine requirements. After thorough review of the chart, we will also order 1 L of LR for improved hemodynamics.     Intervention Category Major Interventions: Shock - evaluation and management  Addendum: Norepinephrine requirement is currently at 17 mcg/min of norepinephrine and 0.03 units/min of vasopressin.  Lactate level however has gone up to 5.4.  Will give another liter of fluids and repeat a lactate in 3 hours. Discussed with RN.   Carilyn Goodpasture 08/09/2023, 12:23 AM

## 2023-08-10 ENCOUNTER — Inpatient Hospital Stay (HOSPITAL_COMMUNITY)

## 2023-08-10 ENCOUNTER — Encounter: Payer: Self-pay | Admitting: Gastroenterology

## 2023-08-10 DIAGNOSIS — I63443 Cerebral infarction due to embolism of bilateral cerebellar arteries: Secondary | ICD-10-CM | POA: Diagnosis not present

## 2023-08-10 DIAGNOSIS — E785 Hyperlipidemia, unspecified: Secondary | ICD-10-CM | POA: Diagnosis not present

## 2023-08-10 DIAGNOSIS — E8729 Other acidosis: Secondary | ICD-10-CM | POA: Diagnosis not present

## 2023-08-10 DIAGNOSIS — J690 Pneumonitis due to inhalation of food and vomit: Secondary | ICD-10-CM | POA: Diagnosis not present

## 2023-08-10 DIAGNOSIS — D6859 Other primary thrombophilia: Secondary | ICD-10-CM | POA: Diagnosis not present

## 2023-08-10 DIAGNOSIS — J9601 Acute respiratory failure with hypoxia: Secondary | ICD-10-CM | POA: Diagnosis not present

## 2023-08-10 DIAGNOSIS — D649 Anemia, unspecified: Secondary | ICD-10-CM | POA: Diagnosis not present

## 2023-08-10 DIAGNOSIS — I639 Cerebral infarction, unspecified: Secondary | ICD-10-CM | POA: Diagnosis not present

## 2023-08-10 LAB — CBC
HCT: 23.4 % — ABNORMAL LOW (ref 39.0–52.0)
HCT: 23.2 % — ABNORMAL LOW (ref 39.0–52.0)
HCT: 23.8 % — ABNORMAL LOW (ref 39.0–52.0)
Hemoglobin: 7.5 g/dL — ABNORMAL LOW (ref 13.0–17.0)
Hemoglobin: 7.5 g/dL — ABNORMAL LOW (ref 13.0–17.0)
Hemoglobin: 7.6 g/dL — ABNORMAL LOW (ref 13.0–17.0)
MCH: 28.5 pg (ref 26.0–34.0)
MCH: 28.4 pg (ref 26.0–34.0)
MCH: 28.4 pg (ref 26.0–34.0)
MCHC: 32.1 g/dL (ref 30.0–36.0)
MCHC: 32.3 g/dL (ref 30.0–36.0)
MCHC: 31.9 g/dL (ref 30.0–36.0)
MCV: 89 fL (ref 80.0–100.0)
MCV: 87.9 fL (ref 80.0–100.0)
MCV: 88.8 fL (ref 80.0–100.0)
Platelets: 64 10*3/uL — ABNORMAL LOW (ref 150–400)
Platelets: 60 10*3/uL — ABNORMAL LOW (ref 150–400)
Platelets: 67 10*3/uL — ABNORMAL LOW (ref 150–400)
RBC: 2.63 MIL/uL — ABNORMAL LOW (ref 4.22–5.81)
RBC: 2.64 MIL/uL — ABNORMAL LOW (ref 4.22–5.81)
RBC: 2.68 MIL/uL — ABNORMAL LOW (ref 4.22–5.81)
RDW: 16.1 % — ABNORMAL HIGH (ref 11.5–15.5)
RDW: 16.2 % — ABNORMAL HIGH (ref 11.5–15.5)
RDW: 16.3 % — ABNORMAL HIGH (ref 11.5–15.5)
WBC: 16.6 10*3/uL — ABNORMAL HIGH (ref 4.0–10.5)
WBC: 16.9 10*3/uL — ABNORMAL HIGH (ref 4.0–10.5)
WBC: 16.8 10*3/uL — ABNORMAL HIGH (ref 4.0–10.5)
nRBC: 0 % (ref 0.0–0.2)
nRBC: 0 % (ref 0.0–0.2)
nRBC: 0 % (ref 0.0–0.2)

## 2023-08-10 LAB — PROTIME-INR
INR: 1.4 — ABNORMAL HIGH (ref 0.8–1.2)
Prothrombin Time: 17.6 s — ABNORMAL HIGH (ref 11.4–15.2)

## 2023-08-10 LAB — GLUCOSE, CAPILLARY
Glucose-Capillary: 103 mg/dL — ABNORMAL HIGH (ref 70–99)
Glucose-Capillary: 119 mg/dL — ABNORMAL HIGH (ref 70–99)
Glucose-Capillary: 152 mg/dL — ABNORMAL HIGH (ref 70–99)
Glucose-Capillary: 181 mg/dL — ABNORMAL HIGH (ref 70–99)
Glucose-Capillary: 185 mg/dL — ABNORMAL HIGH (ref 70–99)
Glucose-Capillary: 194 mg/dL — ABNORMAL HIGH (ref 70–99)

## 2023-08-10 LAB — COMPREHENSIVE METABOLIC PANEL WITH GFR
ALT: 138 U/L — ABNORMAL HIGH (ref 0–44)
AST: 110 U/L — ABNORMAL HIGH (ref 15–41)
Albumin: <1.5 g/dL — ABNORMAL LOW (ref 3.5–5.0)
Alkaline Phosphatase: 150 U/L — ABNORMAL HIGH (ref 38–126)
Anion gap: 14 (ref 5–15)
BUN: 57 mg/dL — ABNORMAL HIGH (ref 8–23)
CO2: 12 mmol/L — ABNORMAL LOW (ref 22–32)
Calcium: 7.8 mg/dL — ABNORMAL LOW (ref 8.9–10.3)
Chloride: 107 mmol/L (ref 98–111)
Creatinine, Ser: 1.4 mg/dL — ABNORMAL HIGH (ref 0.61–1.24)
GFR, Estimated: 53 mL/min — ABNORMAL LOW (ref >60)
Glucose, Bld: 203 mg/dL — ABNORMAL HIGH (ref 70–99)
Potassium: 4.3 mmol/L (ref 3.5–5.1)
Sodium: 133 mmol/L — ABNORMAL LOW (ref 135–145)
Total Bilirubin: 3.9 mg/dL — ABNORMAL HIGH (ref 0.0–1.2)
Total Protein: 3.5 g/dL — ABNORMAL LOW (ref 6.5–8.1)

## 2023-08-10 LAB — LACTIC ACID, PLASMA
Lactic Acid, Venous: 6 mmol/L (ref 0.5–1.9)
Lactic Acid, Venous: 6.6 mmol/L (ref 0.5–1.9)
Lactic Acid, Venous: 6.3 mmol/L (ref 0.5–1.9)
Lactic Acid, Venous: 6.2 mmol/L (ref 0.5–1.9)

## 2023-08-10 LAB — HEPARIN LEVEL (UNFRACTIONATED)
Heparin Unfractionated: 0.21 [IU]/mL — ABNORMAL LOW (ref 0.30–0.70)
Heparin Unfractionated: 0.23 [IU]/mL — ABNORMAL LOW (ref 0.30–0.70)

## 2023-08-10 LAB — BASIC METABOLIC PANEL WITH GFR
Anion gap: 13 (ref 5–15)
BUN: 55 mg/dL — ABNORMAL HIGH (ref 8–23)
CO2: 18 mmol/L — ABNORMAL LOW (ref 22–32)
Calcium: 7.7 mg/dL — ABNORMAL LOW (ref 8.9–10.3)
Chloride: 104 mmol/L (ref 98–111)
Creatinine, Ser: 1.3 mg/dL — ABNORMAL HIGH (ref 0.61–1.24)
GFR, Estimated: 58 mL/min — ABNORMAL LOW (ref >60)
Glucose, Bld: 152 mg/dL — ABNORMAL HIGH (ref 70–99)
Potassium: 4.1 mmol/L (ref 3.5–5.1)
Sodium: 135 mmol/L (ref 135–145)

## 2023-08-10 LAB — MAGNESIUM
Magnesium: 2.1 mg/dL (ref 1.7–2.4)
Magnesium: 2 mg/dL (ref 1.7–2.4)

## 2023-08-10 LAB — PHOSPHORUS
Phosphorus: 3.6 mg/dL (ref 2.5–4.6)
Phosphorus: 3.4 mg/dL (ref 2.5–4.6)

## 2023-08-10 MED ORDER — MIDAZOLAM HCL 2 MG/2ML IJ SOLN: 1.0000 mg | INTRAMUSCULAR | Status: DC | PRN

## 2023-08-10 MED ORDER — SODIUM CHLORIDE 0.9 % IV BOLUS
500.0000 mL | Freq: Once | INTRAVENOUS | Status: AC
  Administered 2023-08-10: 500 mL via INTRAVENOUS

## 2023-08-10 MED ORDER — ADENOSINE 6 MG/2ML IV SOLN
INTRAVENOUS | Status: AC
  Filled 2023-08-10: qty 6

## 2023-08-10 MED ORDER — SODIUM BICARBONATE 8.4 % IV SOLN
INTRAVENOUS | Status: AC
  Filled 2023-08-10: qty 50

## 2023-08-10 MED ORDER — SODIUM BICARBONATE 8.4 % IV SOLN
100.0000 meq | Freq: Once | INTRAVENOUS | Status: AC
  Administered 2023-08-10: 100 meq via INTRAVENOUS

## 2023-08-10 MED ORDER — MAGNESIUM SULFATE 2 GM/50ML IV SOLN
2.0000 g | Freq: Once | INTRAVENOUS | Status: AC
  Administered 2023-08-10: 2 g via INTRAVENOUS

## 2023-08-10 MED ORDER — INSULIN ASPART 100 UNIT/ML IJ SOLN
2.0000 [IU] | INTRAMUSCULAR | Status: DC
  Administered 2023-08-10 – 2023-08-12 (×5): 2 [IU] via SUBCUTANEOUS

## 2023-08-10 MED ORDER — FUROSEMIDE 10 MG/ML IJ SOLN
40.0000 mg | Freq: Three times a day (TID) | INTRAMUSCULAR | Status: DC
  Administered 2023-08-10: 40 mg via INTRAVENOUS
  Filled 2023-08-10: qty 4

## 2023-08-10 MED ORDER — SODIUM BICARBONATE 8.4 % IV SOLN: 100.0000 meq | Freq: Once | INTRAVENOUS | Status: DC

## 2023-08-10 MED ORDER — AMIODARONE HCL IN DEXTROSE 360-4.14 MG/200ML-% IV SOLN
30.0000 mg/h | INTRAVENOUS | Status: DC
  Administered 2023-08-11 – 2023-08-12 (×4): 30 mg/h via INTRAVENOUS
  Filled 2023-08-10 (×3): qty 200

## 2023-08-10 MED ORDER — AMIODARONE HCL IN DEXTROSE 360-4.14 MG/200ML-% IV SOLN
60.0000 mg/h | INTRAVENOUS | Status: DC
  Administered 2023-08-10: 60 mg/h via INTRAVENOUS
  Filled 2023-08-10: qty 200

## 2023-08-10 MED ORDER — SODIUM BICARBONATE 650 MG PO TABS
1300.0000 mg | ORAL_TABLET | Freq: Three times a day (TID) | ORAL | Status: DC
  Administered 2023-08-10 – 2023-08-14 (×15): 1300 mg
  Filled 2023-08-10 (×15): qty 2

## 2023-08-10 MED ORDER — PHENYLEPHRINE HCL-NACL 20-0.9 MG/250ML-% IV SOLN
0.0000 ug/min | INTRAVENOUS | Status: DC
  Administered 2023-08-11: 80 ug/min via INTRAVENOUS
  Administered 2023-08-11: 20 ug/min via INTRAVENOUS
  Administered 2023-08-12: 30 ug/min via INTRAVENOUS
  Filled 2023-08-10 (×4): qty 250

## 2023-08-10 MED ORDER — FUROSEMIDE 10 MG/ML IJ SOLN: 40.0000 mg | Freq: Once | INTRAMUSCULAR | Status: AC

## 2023-08-10 MED ORDER — FUROSEMIDE 10 MG/ML IJ SOLN: 60.0000 mg | Freq: Three times a day (TID) | INTRAMUSCULAR | Status: DC

## 2023-08-10 MED ORDER — SODIUM CHLORIDE 0.9 % IV BOLUS
1000.0000 mL | Freq: Once | INTRAVENOUS | Status: AC
  Administered 2023-08-10: 1000 mL via INTRAVENOUS

## 2023-08-10 MED ORDER — AMIODARONE LOAD VIA INFUSION
150.0000 mg | Freq: Once | INTRAVENOUS | Status: AC
  Administered 2023-08-10: 150 mg via INTRAVENOUS
  Filled 2023-08-10: qty 83.34

## 2023-08-10 NOTE — Progress Notes (Signed)
 Called to see patent due to sVT  On arriva  On vent 40% Shock 0 on levophed 8mc Also on precedex gtt   HAd gotten 6mg  -=> 6mg  -> 12mg  adenisone and just got brady -> RN says tele looked like A Flutter  In septic shock with elevated lactatee and PCT HAd fever  - got tylenol 103F - > 99,68F    LABS    PULMONARY Recent Labs  Lab 08/04/23 0825 08/08/23 0912 08/08/23 1152 08/09/23 0114 08/09/23 1954  PHART 7.47* 7.43 7.374 7.348* 7.346*  PCO2ART 37 34 35.0 32.2 32.3  PO2ART 128* 54* 534* 159* 119*  HCO3 26.8 22.2 20.4 17.3* 17.7*  TCO2  --   --  21* 18* 19*  O2SAT 100 86.7 100 99 99    CBC Recent Labs  Lab 08/10/23 0054 08/10/23 1214 08/10/23 1620  HGB 7.5* 7.5* 7.6*  HCT 23.4* 23.2* 23.8*  WBC 16.6* 16.9* 16.8*  PLT 64* 60* 67*    COAGULATION Recent Labs  Lab 08/07/23 0243 08/07/23 1613 08/08/23 0812 08/09/23 0533 08/10/23 0521  INR 1.4* 1.3* 1.4* 1.4* 1.4*    CARDIAC  No results for input(s): "TROPONINI" in the last 168 hours. No results for input(s): "PROBNP" in the last 168 hours.   CHEMISTRY Recent Labs  Lab 08/08/23 0635 08/08/23 1152 08/09/23 0107 08/09/23 0114 08/09/23 0533 08/09/23 1700 08/09/23 1954 08/10/23 0521 08/10/23 1620  NA 137   < > 134* 136 134*  --  134* 133* 135  K 4.0   < > 4.3 4.3 4.4  --  4.5 4.3 4.1  CL 105  --  106  --  107  --   --  107 104  CO2 21*  --  16*  --  19*  --   --  12* 18*  GLUCOSE 124*  --  157*  --  164*  --   --  203* 152*  BUN 32*  --  49*  --  46*  --   --  57* 55*  CREATININE 1.09  --  1.46*  --  1.27*  --   --  1.40* 1.30*  CALCIUM 7.9*  --  7.8*  --  7.6*  --   --  7.8* 7.7*  MG 1.7  --  1.7  --  1.8 1.8  --  2.1 2.0  PHOS 4.0  --  4.5  --  4.7* 4.3  --  3.6 3.4   < > = values in this interval not displayed.   Estimated Creatinine Clearance: 60.7 mL/min (A) (by C-G formula based on SCr of 1.3 mg/dL (H)).   LIVER Recent Labs  Lab 08/06/23 0509 08/07/23 0243 08/07/23 1613  08/08/23 9147 08/08/23 0812 08/09/23 0107 08/09/23 0533 08/10/23 0521  AST 206*  --  515* 376*  --   --  172* 110*  ALT 186*  --  469* 361*  --   --  240* 138*  ALKPHOS 142*  --  234* 211*  --   --  195* 150*  BILITOT 1.5*  --  1.2 1.7*  --   --  2.8* 3.9*  PROT 4.5*  --  4.0* 4.0*  --   --  3.9* 3.5*  ALBUMIN 2.1*  --  1.7* 1.6*  --  1.6* 1.5* <1.5*  INR  --  1.4* 1.3*  --  1.4*  --  1.4* 1.4*     INFECTIOUS Recent Labs  Lab 08/07/23 1613 08/07/23 1901 08/08/23 0635 08/09/23  0107 08/09/23 0533 08/09/23 1725 08/10/23 1057 08/10/23 1800 08/10/23 2002  LATICACIDVEN 2.1*   < >  --    < > 4.5*   < > 6.6* 6.3* 6.2*  PROCALCITON 1.27  --  1.17  --  4.87  --   --   --   --    < > = values in this interval not displayed.     ENDOCRINE CBG (last 3)  Recent Labs    08/10/23 1509 08/10/23 1938 08/10/23 2342  GLUCAP 152* 119* 103*      A  SVT ? A F lutter Lon QTc in settin of A Flutter  lIkely metabolic - recent echo nomral, now febrile (has had worklup for crypto stroke) and septic  Mild metabolich acidisis +  Plan - fluid bolus  - amio bolus and gtt - cards cnsult called by elink - dc precedex  - do versed prn (avoding fent gtt due to hida tomorrow)  - mag bolus - boc bolus  - check labs - cbc, trop, prct, lactate, bmet, lft , inr stat - > signed out to elink  30 min critical care for 08/10/23   SIGNATURE    Dr. Kalman Shan, M.D., F.C.C.P,  Pulmonary and Critical Care Medicine Staff Physician, [facility] Center Director - Interstitial Lung Disease  Program  Pulmonary Fibrosis [facility] at [facility] [place], [place], [address]   Pager: [phone], If no answer  -> Check AMION or Try [phone] Telephone (clinical office): [phone] Telephone (research): [phone]  12:00 AM 08/11/2023

## 2023-08-10 NOTE — Progress Notes (Signed)
 eLink Physician-Brief Progress Note Patient Name: Kenneth [name] DOB: 06/06/1950 MRN: [id number]   Date of Service  08/10/2023  HPI/Events of Note  Repeat  LA 6.2  eICU Interventions  NS bolus 1L ordered     Intervention Category Minor Interventions: Clinical assessment - ordering diagnostic tests  Aisley Whan [name] 08/10/2023, 8:47 PM

## 2023-08-10 NOTE — Progress Notes (Signed)
 PHARMACY - ANTICOAGULATION CONSULT NOTE  Pharmacy Consult for heparin Indication: stroke  Patient Measurements: Height: 5' 10.98" (180.3 cm) Weight: 102.2 kg (225 lb 5 oz) IBW/kg (Calculated) : 75.26 HEPARIN DW (KG): 86.6  Vital Signs: Temp: 99.5 F (37.5 C) (03/31 1200) Temp Source: Axillary (03/31 1200) BP: 125/50 (03/31 0800) Pulse Rate: 73 (03/31 1200)  Labs: Recent Labs    08/08/23 0812 08/08/23 1152 08/09/23 0107 08/09/23 0114 08/09/23 0533 08/09/23 1658 08/09/23 1700 08/09/23 2000 08/09/23 2059 08/10/23 0054 08/10/23 0521 08/10/23 1214  HGB  --    < >  --    < > 8.4*  --    < >  --  7.2* 7.5*  --  7.5*  HCT  --    < >  --    < > 25.5*  --    < >  --  22.6* 23.4*  --  23.2*  PLT  --   --   --   --  71*  --   --   --  60* 64*  --  60*  LABPROT 17.1*  --   --   --  16.9*  --   --   --   --   --  17.6*  --   INR 1.4*  --   --   --  1.4*  --   --   --   --   --  1.4*  --   HEPARINUNFRC  --   --   --   --   --  0.28*  --  0.28*  --   --   --  0.21*  CREATININE  --   --  1.46*  --  1.27*  --   --   --   --   --  1.40*  --    < > = values in this interval not displayed.    Estimated Creatinine Clearance: 56.4 mL/min (A) (by C-G formula based on SCr of 1.4 mg/dL (H)).  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   Was admitted 3/22 for progressive weakness. 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 Pharmacy consulted to dose heparin 3/30.  Heparin level subtherapeutic at 0.21 on 1100 units/hr, CBC re-draw remains low but stable  Goal of Therapy:  Heparin level 0.3-0.5  units/ml Monitor platelets by anticoagulation protocol: Yes   Plan:  Increase heparin gtt to 1200 units/hr F/u 8 hour heparin level  Daylene Posey, PharmD, [facility] Clinical Pharmacist ED Pharmacist Phone # [phone] 08/10/2023 2:51 PM

## 2023-08-10 NOTE — Progress Notes (Addendum)
 RUQ [place]: borderline wall thickening, stones & sludge, no ductal dilation.  Trend bilirubin in AM-- if rising again, needs HIDA scan. Fentanyl turned off tonight to facilitate getting HIDA tomorrow.  Repeat LA pending. CBC still stable. Bicarb better after getting 2 amps this morning. Hodl TF tonight, ok for meds down tube.   D/w RN.  Steffanie Dunn, DO 08/10/23 6:05 PM [facility]  For contact information, see Amion. If no response to pager, please call PCCM consult pager. After hours, 7PM- 7AM, please call Elink.   LA minimally downtrending. Another 500cc NS. CXR unchanged. Repeat LA ordered.  Steffanie Dunn, DO 08/10/23 7:01 PM [facility]  For contact information, see Amion. If no response to pager, please call PCCM consult pager. After hours, 7PM- 7AM, please call Elink.

## 2023-08-10 NOTE — Progress Notes (Signed)
 Patient ID: Kenneth [name], male   DOB: [birth year], 73 y.o.   MRN: [id number] BP (!) 114/50   Pulse 82   Temp 100.2 F (37.9 C) (Axillary)   Resp (!) 23   Ht 5' 10.98" (1.803 m)   Wt 102.2 kg   SpO2 99%   BMI 31.44 kg/m  Events of weekend noted. Septic, intubated.  Clearly no need for acute neurosurgical intervention.  Will follow

## 2023-08-10 NOTE — Progress Notes (Signed)
 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   On ED arrival  pt was afebrile, HR:63, BP: 104/47, RR 23. Labs were notable for WBC 7.9, Hgb: 9.0( baseline around 12), Platelets 87, Na: 132, K: 4.1, CO2: 21, Cr, 0.87( Baseline), Transaminases/ ALK Phos WNL, T.bili elevated at 1.9, Mg: 1.4. Troponin : 5--> 4, BNP: 33. FOBT negative, Iron studies consistent with deficiency, B12 low. TSH/T4 WNL. UA grossly normal. RVP negative. CXR no acute, chronic right base changes.  Admitted to [facility]. GI was consulted for anemia in the setting of DAPT with recommendation for BID PPI and EGD to r/o occult GIB. EGD completed 3/23 with mild gastritis, no evidence of active bleeding. Neurology consulted(3/25) for new onset confusion. CT head completed 3/25 with NAICA, new small chronic cortical/ subcortical infarct R frontal lobe ( new from prior), known small chronic infarcts, L frontal/ L parietal/ R temporal lobes. EEG (3/25) showed mild diffuse encephalopathy, no seizures. F/u MRI brain (3/25) showed many small acute infarcts concerning for embolic etiology. MRI C-Spine with severe canal stenosis concerning for edema and compressive myelopathy, severe left/moderate right foraminal stenosis, edema of left T1 inferior articular facet. MRI T-spine negative. Given patients progressive weakness in the setting of known C-Spine compression, NSG consulted for  evaluation 3/26 with recommendation for eventual cord decompression once optimized from a medical standpoint.Additionally, psychiatry was consulted for recommendations on medication management for depressed mood. Cardiology was consulted 3/27 for preoperative cardiac risk stratification.  On 3/28AM, patient was reportedly hot to touch per RN with increased RR and WOB and Tmax 103F. CXR obtained 3/28 stable . WBC slightly elevated to 10.8 , INR 1.4, K 3.3, CO2 21. PCT 1.35 ( 0.65), LA 2.2 with repeat >9.   PCCM consulted for concern for developing sepsis, acute liver injury.  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 CT Head/MRI Brain obtained. MRI C-spine and T-spine completed. 3/26 - NSGY evaluation for ?decompression surgery.  3/27 - Cards consult for preoperative risk stratification for possible cord decompression  surgery, tentatively 3/31. 3/28 - Early AM, febrile to Tmax 103F with increased RR/WOB and ?aspiration; LA 2.2 with mild WBC elevation to 10.8; CXR stable. LA > 9 prompted PCCM consult (aberrant value, repeat 2.1). LFTs elevated. Worsening mental status/drowsiness. 3/29  Worsening mental status. Intubated for airway protection. MRI with new 5 mm acute infarct in left pons and numerous acute/subacute infarcts without interval progression  Interim History / Subjective:  On low dose precedex and fentanyl. Not responding much. Tmax 103.    Objective:  Blood pressure (!) 114/41, pulse 75, temperature 97.9 F (36.6 C), temperature source Axillary, resp. rate (!) 23, height 5' 10.98" (1.803 m), weight 102.2 kg, SpO2 100%. CVP:  [6 mmHg] 6 mmHg  Vent Mode: PSV;CPAP FiO2 (%):  [40 %] 40 % Set Rate:  [18 bmp] 18 bmp Vt Set:  [600 mL] 600 mL PEEP:  [5 cmH20] 5 cmH20 Pressure Support:  [10 cmH20] 10 cmH20 Plateau Pressure:  [18 cmH20-20 cmH20] 19 cmH20   Intake/Output Summary (Last 24 hours) at 08/10/2023 0853 Last data filed at 08/10/2023 0818 Gross per 24 hour  Intake 3883.13 ml  Output 1040 ml  Net 2843.13 ml   Filed Weights   08/01/23 0628 08/10/23 0500  Weight: 86.6 kg 102.2 kg   Physical Exam: General: critically ill appearing man lying in bed in NAD HENT: [place]/AT, eyes anicteric Respiratory: breathing comfortably on PS, minimal secretions Cardiovascular: S1S2, RRR GI: soft, NT Extremities:++edema Neuro: RASS -5, moving eyebrows some with stimulation but not opening eyes, + cough  Na+ 133 Bicarb 12; AG 14 BUN 57 Cr 1.4 LA 6, rising T bili 3.7  AST 110 ALT 138   Resolved Hospital Problem List:    Assessment & Plan:  Cryptogenic CVAs, 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 TCD bubble study 2/15 with "small, clinically insignificant PFO". Loop recorder negative for arrhythmia/A-Fib Hypercoagulable state: Compound heterozygous state for factor V Leiden mutation and prothrombin gene mutation and low protein S levels - Failed Plavix and Brilinta Plan: - appreciate stroke team's management, con't aspirin + heparin; long term will  be on aspirin +DOAC - serial neuro checks - neuroprotective measures -mirtazapine stopped with encephalopathy  Acute hypoxemic respiratory failure secondary to aspiration in setting of recurrent strokes -LTVV -VAP prevention protocol -PAD protocol for sedation -daily SAT & SBT as appropriate; need to figure out cause of lactate before this can be aggressively pursued -VAP prevention protocol  Septic shock secondary aspiration PNA, sedation related hypotension, sacral ulcer wound Rising lactic acid -repeat blood culture; needs follow up after 1 of 4 blood cx bottles growing MRSE. 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 MRI T-spine negative. - NSGY consulted- tentative plan for cord decompression, waiting on improvement in overall medical status. Anticipate it will be at least a few more days with ongoing metabolic disarray.  -PT, OT when stable  Anemia, multifactorial in the setting of critical illness, B12 deficiency, iron  deficiency -transfuse for Hb <7 or hemodynamically significant bleeding -iron, thiamine, folace, B12 supplements  T2DM - SSI PRN -goal BG 140-180 - con't glargine 30 units daily -TF held today for RUQ [place], but can start TF coverage tonight after TF resumed.  History of diverticulosis - Noted history  Hard of hearing, congenital -hearing aids in place  Wife and sister updated at bedside.   Best Practice: (right click and "Reselect all SmartList Selections" daily)   Diet/type: tubefeeds> held for US DVT prophylaxis: systemic heparin GI prophylaxis: PPI Lines: PICC Foley:  yes Code Status:  full code Last date of multidisciplinary goals of care discussion [3/29] Confirmed code status with wife   Critical care time:    This patient is critically ill with multiple organ system failure which requires frequent high complexity decision making, assessment, support, evaluation, and titration of therapies. This was completed through the application of advanced monitoring technologies and extensive interpretation of multiple databases. During this encounter critical care time was devoted to patient care services described in this note for 48 minutes.   Steffanie Dunn, DO 08/10/23 9:28 AM [place] [facility]  For contact information, see Amion. If no response to pager, please call PCCM consult pager. After hours, 7PM- 7AM, please call Elink.

## 2023-08-10 NOTE — Plan of Care (Signed)
  Problem: Coping: Goal: Ability to adjust to condition or change in health will improve Outcome: Progressing   Problem: Fluid Volume: Goal: Ability to maintain a balanced intake and output will improve Outcome: Progressing   Problem: Health Behavior/Discharge Planning: Goal: Ability to manage health-related needs will improve Outcome: Progressing   Problem: Metabolic: Goal: Ability to maintain appropriate glucose levels will improve Outcome: Progressing   Problem: Nutritional: Goal: Maintenance of adequate nutrition will improve Outcome: Progressing Goal: Progress toward achieving an optimal weight will improve Outcome: Progressing   Problem: Skin Integrity: Goal: Risk for impaired skin integrity will decrease Outcome: Progressing   Problem: Tissue Perfusion: Goal: Adequacy of tissue perfusion will improve Outcome: Progressing   Problem: Education: Goal: Knowledge of General Education information will improve Description: Including pain rating scale, medication(s)/side effects and non-pharmacologic comfort measures Outcome: Progressing   Problem: Health Behavior/Discharge Planning: Goal: Ability to manage health-related needs will improve Outcome: Progressing   Problem: Clinical Measurements: Goal: Ability to maintain clinical measurements within normal limits will improve Outcome: Progressing Goal: Will remain free from infection Outcome: Progressing Goal: Diagnostic test results will improve Outcome: Progressing Goal: Respiratory complications will improve Outcome: Progressing Goal: Cardiovascular complication will be avoided Outcome: Progressing   Problem: Activity: Goal: Risk for activity intolerance will decrease Outcome: Progressing   Problem: Nutrition: Goal: Adequate nutrition will be maintained Outcome: Progressing   Problem: Coping: Goal: Level of anxiety will decrease Outcome: Progressing   Problem: Elimination: Goal: Will not experience  complications related to bowel motility Outcome: Progressing Goal: Will not experience complications related to urinary retention Outcome: Progressing   Problem: Pain Managment: Goal: General experience of comfort will improve and/or be controlled Outcome: Progressing   Problem: Safety: Goal: Ability to remain free from injury will improve Outcome: Progressing   Problem: Skin Integrity: Goal: Risk for impaired skin integrity will decrease Outcome: Progressing   Problem: Activity: Goal: Ability to tolerate increased activity will improve Outcome: Progressing   Problem: Respiratory: Goal: Ability to maintain a clear airway and adequate ventilation will improve Outcome: Progressing   Problem: Role Relationship: Goal: Method of communication will improve Outcome: Progressing

## 2023-08-10 NOTE — Progress Notes (Signed)
Patient placed back on full vent support due to increased WOB. °

## 2023-08-10 NOTE — TOC Progression Note (Signed)
 Transition of Care [facility]) - Progression Note    Patient Details  Name: Kenneth [name] MRN: [id number] Date of Birth: [birth year]  [facility] CM/SW Contact  Mearl Latin, LCSW Phone Number: 08/10/2023, 9:42 AM  Clinical Narrative:    Patient remains intubated. CSW continuing to follow.   Expected Discharge Plan: Skilled Nursing Facility Barriers to Discharge: Continued Medical Work up, SNF Pending bed offer  Expected Discharge Plan and Services In-house Referral: Clinical Social Work   Post Acute Care Choice: Skilled Nursing Facility Living arrangements for the past 2 months: Single Family Home                                       Social Determinants of Health (SDOH) Interventions SDOH Screenings   Food Insecurity: No Food Insecurity (08/01/2023)  Housing: Low Risk  (08/01/2023)  Transportation Needs: No Transportation Needs (08/01/2023)  Utilities: Not At Risk (08/01/2023)  Depression (PHQ2-9): Low Risk  (11/07/2022)  Social Connections: Socially Isolated (08/01/2023)  Tobacco Use: Medium Risk (08/01/2023)    Readmission Risk Interventions     No data to display

## 2023-08-10 NOTE — Consult Note (Signed)
  Patient remains intubated for 3rd day., and therefore is unable to participate in a psychiatric evaluation. Chart review shows there are no psychiatric medications or acute psychiatric symptoms that contribute to his current state.  At this time psychiatric consult service will sign off. Please reconsult if psychiatry services are warranted following extubation and medical stability.   Thank you for this consult.

## 2023-08-10 NOTE — Progress Notes (Signed)
 STROKE TEAM PROGRESS NOTE   INTERIM HISTORY/SUBJECTIVE Wife and sister are at the bedside.  Patient is still intubated but less responsive as yesterday. Pt is on precedex and fentanyl, continued to have worsening anemia and thrombocytopenia. On heparin IV. Still has high lactic acid and leukocytosis.    OBJECTIVE  CBC    Component Value Date/Time   WBC 16.6 (H) 08/10/2023 0054   RBC 2.63 (L) 08/10/2023 0054   HGB 7.5 (L) 08/10/2023 0054   HGB 12.3 (L) 05/01/2023 1204   HCT 23.4 (L) 08/10/2023 0054   PLT 64 (L) 08/10/2023 0054   PLT 249 05/01/2023 1204   MCV 89.0 08/10/2023 0054   MCH 28.5 08/10/2023 0054   MCHC 32.1 08/10/2023 0054   RDW 16.1 (H) 08/10/2023 0054   LYMPHSABS 2.6 08/09/2023 0533   MONOABS 3.3 (H) 08/09/2023 0533   EOSABS 0.0 08/09/2023 0533   BASOSABS 0.1 08/09/2023 0533    BMET    Component Value Date/Time   NA 133 (L) 08/10/2023 0521   NA 142 04/06/2017 1057   K 4.3 08/10/2023 0521   CL 107 08/10/2023 0521   CO2 12 (L) 08/10/2023 0521   GLUCOSE 203 (H) 08/10/2023 0521   BUN 57 (H) 08/10/2023 0521   BUN 13 04/06/2017 1057   CREATININE 1.40 (H) 08/10/2023 0521   CREATININE 0.94 05/01/2023 1204   CALCIUM 7.8 (L) 08/10/2023 0521   GFRNONAA 53 (L) 08/10/2023 0521   GFRNONAA >60 05/01/2023 1204    IMAGING past 24 hours No results found.   Vitals:   08/10/23 1100 08/10/23 1148 08/10/23 1156 08/10/23 1200  BP:      Pulse: 77 74 77 73  Resp: (!) 24 20 (!) 27 (!) 27  Temp:    99.5 F (37.5 C)  TempSrc:    Axillary  SpO2: 99% 99% 100% 98%  Weight:      Height:         PHYSICAL EXAM  Temp:  [97.9 F (36.6 C)-102.9 F (39.4 C)] 99.5 F (37.5 C) (03/31 1200) Pulse Rate:  [65-90] 73 (03/31 1200) Resp:  [0-27] 27 (03/31 1200) BP: (85-138)/(34-83) 125/50 (03/31 0800) SpO2:  [98 %-100 %] 98 % (03/31 1200) Arterial Line BP: (99-128)/(35-46) 124/44 (03/31 1200) FiO2 (%):  [40 %] 40 % (03/31 1148) Weight:  [102.2 kg] 102.2 kg (03/31  0500)  General - Well nourished, well developed,  on sedation.  Ophthalmologic - fundi not visualized due to noncooperation.  Cardiovascular - Regular rate and rhythm.  Neuro - intubated on fentanyl and precedex, eyes closed, not open on voice and not following commands. With eye opening, eyes in need position, not blinking to visual threat and not tracking to provider, pupils equal size light reflexes sluggish. Corneal reflex weakly present, gag and cough present. Breathing over the vent.  Facial symmetry not able to test due to ET tube.  Tongue protrusion not cooperative. On pain stimulation, no movement in all extremities. Sensation, coordination and gait not tested.    ASSESSMENT/PLAN  [name] is a 73 y.o. male with history of prior history of cryptogenic strokes, diabetes, hyperlipidemia, hypertension, iron deficiency anemia, OSA, prostate cancer admitted for gradually progressive weakness and poor nutrition intake.    Stroke:  b/l small bilateral multifocal embolic infarcts, etiology: possible from primary hypercoagulable state CT head No acute abnormality.  MRI  Many small acute infarcts in the bilateral frontal and parietal lobes, left thalamus, right occipital lobe, and cerebellum MRA iron [place] MRI  repeat new small left pontine acute infarct.  Possible small left frontal cortex new infarct.  Stable subacute infarcts seen on the last MRI 2D Echo EF 60-65%.  LA size normal. CT chest abdomen pelvis no evidence of malignancy  Loop recorder interrogation negative for paroxysmal A-fib  LDL 47 HgbA1c 5.3 EEG diffuse slowing.  No seizure activity VTE prophylaxis - heparin subq aspirin 81 mg daily and Brilinta (ticagrelor) 90 mg bid prior to admission, now on heparin IV after discussion with wife. Off ASA due to worsening thrombocytopenia Therapy recommendations: SNF Disposition: Pending  Respiratory failure Aspiration Sepsis / septic shock Intubated for airway  protection CCM on board Vent management per CCM On Zosyn and vancomycin Leukocytosis WBC 15.0->16.6 LA 4.8->6.0->6.6 CCM to rule out cholecystitis   Hx of Stroke/TIA 09/2022 admitted for bilateral ACA infarct, left more than right. CTA head and neck showed right A3 stenosis. EF 55 to 60%, no DVT. LDL 45, A1c 5.6. Discharged on DAPT and Lipitor 40 and Zetia. Loop recorder placed.  03/2023 admitted for small acute to subacute infarct on the left frontal and left parietal lobes, and right temporal white matter.  CTA head and neck similar diminutive distal right ACA.  EF 60 to 65%.  Loop recorder no A-fib.  LDL 47, A1c 8.2.  Discharged on aspirin and Brilinta for 30 days and then Plavix alone.  Also discharged on Lipitor and Zetia.  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 Dr. Candise Che saw pt again on 3/28 and again recommended anticoagulation Discussed with wife, now on heparin IV   Cervical myelopathy  MRI C-spine  Severe canal stenosis at C3-C4. Cord T2 hyperintensity superior to this level, suspicious for edema and compressive myelopathy. Severe left and moderate right foraminal stenosis at this level. MRI T-spine Edema involving the left T1 inferior articular facet Neurosurgery consulted, recommend cervical surgery once medically stable.   Off aspirin 81 due to continued thrombocytopenia, has been off brilinta  Hx of hypertension Now hypotension with septic shock Home meds: Lotensin 40 mg [place]gro[place] 25 mg Stable on 2 pressors with Levophed and vasopressin Long-term BP goal normotensive  Hyperlipidemia Home meds: Atorvastatin 40 mg and Zetia 10 mg LDL 47, goal < 70 Statin and zetia are on hold given acute liver injury Continue statin once LFTs normalize  Diabetes type II Controlled Home meds: Metformin,  Toujeo HgbA1c 5.3, goal < 7.0 CBGs SSI Recommend close follow-up with PCP for better DM control  Acute liver injury AST/ALT 515/469--376/361--172/240--110/138 INR 1.4--1.3--1.4--1.4 Close monitoring Management per CCM  Anemia and thrombocytopenia Hemoglobin 8.9--8.5--8.1--7.8--8.4--7.5 Platelet 97--91--89--71--64 Continue CBC monitoring closely  Other Stroke Risk Factors Obstructive sleep apnea, not on CPAP at home Advanced age  Other Active Problems BPH Depression, psychiatry on board B12 deficiency Cognitive impairment   Hospital day # 9  This patient is critically ill due to embolic stroke, hypercoagulable state, respiratory failure, and at significant risk of neurological worsening, death form further stroke, hemorrhagic transformation, PE/DVT. This patient's care requires constant monitoring of vital signs, hemodynamics, respiratory and cardiac monitoring, review of multiple databases, neurological assessment, discussion with family, other specialists and medical decision making of high complexity. I spent 40 minutes of neurocritical care time in the care of this patient.  I had long discussion with wife and sister at bedside, updated pt current condition, treatment plan and potential prognosis,  and answered all the questions.  They expressed understanding and appreciation. I also discussed with Dr. Chestine Spore CCM.  Marvel Plan, MD PhD Stroke Neurology 08/10/2023 12:44 PM    To contact Stroke Continuity provider, please refer to [email or web address]. After hours, contact General Neurology

## 2023-08-10 NOTE — Plan of Care (Signed)
  Problem: Coping: Goal: Ability to adjust to condition or change in health will improve Outcome: Progressing   Problem: Fluid Volume: Goal: Ability to maintain a balanced intake and output will improve Outcome: Progressing   Problem: Health Behavior/Discharge Planning: Goal: Ability to manage health-related needs will improve Outcome: Progressing   Problem: Metabolic: Goal: Ability to maintain appropriate glucose levels will improve Outcome: Progressing   Problem: Nutritional: Goal: Maintenance of adequate nutrition will improve Outcome: Progressing Goal: Progress toward achieving an optimal weight will improve Outcome: Progressing   Problem: Skin Integrity: Goal: Risk for impaired skin integrity will decrease Outcome: Progressing   Problem: Tissue Perfusion: Goal: Adequacy of tissue perfusion will improve Outcome: Progressing   Problem: Education: Goal: Knowledge of General Education information will improve Description: Including pain rating scale, medication(s)/side effects and non-pharmacologic comfort measures Outcome: Progressing   Problem: Health Behavior/Discharge Planning: Goal: Ability to manage health-related needs will improve Outcome: Progressing   Problem: Clinical Measurements: Goal: Ability to maintain clinical measurements within normal limits will improve Outcome: Progressing Goal: Will remain free from infection Outcome: Progressing Goal: Diagnostic test results will improve Outcome: Progressing Goal: Respiratory complications will improve Outcome: Progressing Goal: Cardiovascular complication will be avoided Outcome: Progressing   Problem: Activity: Goal: Risk for activity intolerance will decrease Outcome: Progressing   Problem: Nutrition: Goal: Adequate nutrition will be maintained Outcome: Progressing   Problem: Coping: Goal: Level of anxiety will decrease Outcome: Progressing   Problem: Elimination: Goal: Will not experience  complications related to bowel motility Outcome: Progressing Goal: Will not experience complications related to urinary retention Outcome: Progressing   Problem: Pain Managment: Goal: General experience of comfort will improve and/or be controlled Outcome: Progressing   Problem: Safety: Goal: Ability to remain free from injury will improve Outcome: Progressing   Problem: Skin Integrity: Goal: Risk for impaired skin integrity will decrease Outcome: Progressing   Problem: Activity: Goal: Ability to tolerate increased activity will improve Outcome: Progressing   Problem: Respiratory: Goal: Ability to maintain a clear airway and adequate ventilation will improve Outcome: Progressing   Problem: Role Relationship: Goal: Method of communication will improve Outcome: Progressing   Problem: Ischemic Stroke/TIA Tissue Perfusion: Goal: Complications of ischemic stroke/TIA will be minimized Outcome: Progressing   Problem: Coping: Goal: Will verbalize positive feelings about self Outcome: Progressing Goal: Will identify appropriate support needs Outcome: Progressing   Problem: Health Behavior/Discharge Planning: Goal: Ability to manage health-related needs will improve Outcome: Progressing Goal: Goals will be collaboratively established with patient/family Outcome: Progressing   Problem: Self-Care: Goal: Ability to participate in self-care as condition permits will improve Outcome: Progressing Goal: Verbalization of feelings and concerns over difficulty with self-care will improve Outcome: Progressing

## 2023-08-10 NOTE — Progress Notes (Signed)
 LA rising, now 6.6. NS bolus. Trend LA. RUQ pending this afternoon, TF stopped around 9AM Still on NE, vasopressin Con't broad antibiotics  Steffanie Dunn, DO 08/10/23 11:49 AM [facility]  For contact information, see Amion. If no response to pager, please call PCCM consult pager. After hours, 7PM- 7AM, please call Elink.

## 2023-08-10 NOTE — Progress Notes (Signed)
 Mr. Balaguer,  The biopsies taken from your stomach were notable for mild reactive gastropathy which is a common finding and often related to use of certain medications (usually NSAIDs), but there was no evidence of Helicobacter pylori infection. This common finding is not felt to necessarily be a cause of any particular symptom and there is no specific treatment or further evaluation recommended.  The biopsies of the duodenum showed benign reactive changes consistent with peptic duodenitis.  No precancerous changes were seen.  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  This finding is thought to represent an increased risk of stomach cancer in some patients.  Patients at highest risk are those patients with a family history of stomach cancer and those with a smoking history, as well as patients who are from areas with high rates of stomach cancer. Periodic surveillance (repeat endoscopy with biopsies) is often performed to monitor for development of cancer or further precancerous changes.  Based on your age and comorbidities, including history of stroke and need for anticoagulation, I would recommend against endoscopic surveillance.

## 2023-08-10 NOTE — Progress Notes (Signed)
 PHARMACY - ANTICOAGULATION CONSULT NOTE  Pharmacy Consult for heparin Indication: stroke  Patient Measurements: Height: 5' 10.98" (180.3 cm) Weight: 103 kg (227 lb 1.2 oz) IBW/kg (Calculated) : 75.26 HEPARIN DW (KG): 86.6  Vital Signs: Temp: 99.3 F (37.4 C) (04/01 0402) Temp Source: Axillary (04/01 0402) BP: 132/45 (04/01 0430) Pulse Rate: 52 (04/01 0430)  Labs: Recent Labs    08/09/23 0533 08/09/23 1658 08/10/23 0521 08/10/23 1214 08/10/23 1620 08/10/23 2209 08/10/23 2358 08/11/23 0037 08/11/23 0453  HGB 8.4*   < >  --  7.5* 7.6*  --  6.5* 7.0*  --   HCT 25.5*   < >  --  23.2* 23.8*  --  19.0* 21.6*  --   PLT 71*   < >  --  60* 67*  --   --  57*  --   LABPROT 16.9*  --  17.6*  --   --   --   --  17.5*  --   INR 1.4*  --  1.4*  --   --   --   --  1.4*  --   HEPARINUNFRC  --    < >  --  0.21*  --  0.23*  --   --  0.21*  CREATININE 1.27*  --  1.40*  --  1.30*  --   --  1.33*  --    < > = values in this interval not displayed.    Estimated Creatinine Clearance: 59.5 mL/min (A) (by C-G formula based on SCr of 1.33 mg/dL (H)).  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   Was admitted 3/22 for progressive weakness. 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 Pharmacy consulted to dose heparin 3/30.  Heparin level subtherapeutic at 0.23 on 1200 units/hr, Last CBC re-draw remains low but stable and per RN, no signs/symptoms of bleeding.  Goal of Therapy:  Heparin level 0.3-0.5 units/ml Monitor platelets by anticoagulation protocol: Yes   Plan:  Increase heparin gtt to 1300  units/hr F/u 8 hour heparin level CBC daily  Arabella Merles, PharmD. Clinical Pharmacist 08/11/2023 5:27 AM   ADDENDUM -AM heparin level returned at 0.21 after rate increase to 1300 units/hr. CBC now showing Plt drop fro 67 > 57 and Hgb low, stable ~7. Per RN no signs/symptoms of bleeding and level drawn appropriately   Goal of Therapy:  Heparin level 0.3-0.5 units/ml Monitor platelets by anticoagulation protocol: Yes   Plan:  Increase heparin gtt to 1400 units/hr F/u 8 hour heparin level CBC daily  Arabella Merles, PharmD. Clinical Pharmacist 08/11/2023 5:40 AM

## 2023-08-10 NOTE — Progress Notes (Signed)
 eLink Physician-Brief Progress Note Patient Name: Kenneth [name] DOB: 02-Dec-1950 MRN: [id number]   Date of Service  08/10/2023  HPI/Events of Note  Called to bedside for episode of SVT 160s with hypotension with SBP 70s. Resolved with Adenosine 6 mg>6mg >12 mg Levo weaned when BP improved. BP correlating with improvement of HR  Ground team arrived for assistance for persistent arrhythmia. Now with atrial flutter Given amio 150 mg bolus with improvement  eICU Interventions  Amio gtt ordered Will consult Cardiology    08/11/23 2:00 AM. Worsening lactic acidosis with recent hypotension in setting of AFRVR. S/p IVF resuscitation. Hg borderline 7.0. No evidence of bleed but will give PRBC x 1 unit for volume.  Intervention Category Intermediate Interventions: Arrhythmia - evaluation and management  Kenneth [name] [name] 08/10/2023, 11:10 PM

## 2023-08-10 NOTE — Progress Notes (Addendum)
 PHARMACY - ANTICOAGULATION CONSULT NOTE  Pharmacy Consult for heparin Indication: stroke  Patient Measurements: Height: 5' 10.98" (180.3 cm) Weight: 86.6 kg (191 lb) IBW/kg (Calculated) : 75.26 HEPARIN DW (KG): 86.6  Vital Signs: Temp: 102.6 F (39.2 C) (03/31 0000) Temp Source: Axillary (03/31 0000) BP: 102/81 (03/31 0000) Pulse Rate: 76 (03/31 0000)  Labs: Recent Labs    08/07/23 1613 08/08/23 6213 08/08/23 0812 08/08/23 1152 08/09/23 0107 08/09/23 0114 08/09/23 0533 08/09/23 1658 08/09/23 1700 08/09/23 1954 08/09/23 2000 08/09/23 2059 08/10/23 0054  HGB  --  8.1*  --    < >  --    < > 8.4*  --    < > 7.1*  --  7.2* 7.5*  HCT  --  23.6*  --    < >  --    < > 25.5*  --    < > 21.0*  --  22.6* 23.4*  PLT  --  89*  --   --   --   --  71*  --   --   --   --  60* 64*  LABPROT 16.4*  --  17.1*  --   --   --  16.9*  --   --   --   --   --   --   INR 1.3*  --  1.4*  --   --   --  1.4*  --   --   --   --   --   --   HEPARINUNFRC  --   --   --   --   --   --   --  0.28*  --   --  0.28*  --   --   CREATININE 1.10 1.09  --   --  1.46*  --  1.27*  --   --   --   --   --   --    < > = values in this interval not displayed.    Estimated Creatinine Clearance: 54.4 mL/min (A) (by C-G formula based on SCr of 1.27 mg/dL (H)).  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   Was admitted 3/22 for progressive weakness. 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 Pharmacy consulted to dose heparin 3/30.  3/30 PM-3/31 AM: Heparin level 0.28 on 1050 units/hr. Patient's Hgb and plts dropped with  plan to retrieve f/u CBC prior to titrating. Hgb low stable in 7s and plts low in 60s. Per CCM okay to continue to titrate gtt to goal. Will titrate slowly given labs. Per RN, no signs/symptoms of bleeding or issues with heparin gtt running continuously   Goal of Therapy:  Heparin level 0.3-0.5 units/ml Monitor platelets by anticoagulation protocol: Yes   Plan:  Increase heparin infusion to 1100 units/hr Check anti-Xa level in 8 hours and daily while on heparin Continue to monitor H&H and platelets  Arabella Merles, PharmD. Clinical Pharmacist 08/10/2023 3:36 AM

## 2023-08-11 ENCOUNTER — Inpatient Hospital Stay (HOSPITAL_COMMUNITY)

## 2023-08-11 DIAGNOSIS — Z515 Encounter for palliative care: Secondary | ICD-10-CM

## 2023-08-11 DIAGNOSIS — Z7189 Other specified counseling: Secondary | ICD-10-CM | POA: Diagnosis not present

## 2023-08-11 DIAGNOSIS — R578 Other shock: Secondary | ICD-10-CM | POA: Diagnosis not present

## 2023-08-11 DIAGNOSIS — I63443 Cerebral infarction due to embolism of bilateral cerebellar arteries: Secondary | ICD-10-CM | POA: Diagnosis not present

## 2023-08-11 DIAGNOSIS — J9601 Acute respiratory failure with hypoxia: Secondary | ICD-10-CM

## 2023-08-11 DIAGNOSIS — E785 Hyperlipidemia, unspecified: Secondary | ICD-10-CM | POA: Diagnosis not present

## 2023-08-11 DIAGNOSIS — D6859 Other primary thrombophilia: Secondary | ICD-10-CM | POA: Diagnosis not present

## 2023-08-11 DIAGNOSIS — I639 Cerebral infarction, unspecified: Secondary | ICD-10-CM | POA: Diagnosis not present

## 2023-08-11 DIAGNOSIS — D649 Anemia, unspecified: Secondary | ICD-10-CM | POA: Diagnosis not present

## 2023-08-11 DIAGNOSIS — E8729 Other acidosis: Secondary | ICD-10-CM | POA: Diagnosis not present

## 2023-08-11 DIAGNOSIS — I4891 Unspecified atrial fibrillation: Secondary | ICD-10-CM

## 2023-08-11 DIAGNOSIS — Z9911 Dependence on respirator [ventilator] status: Secondary | ICD-10-CM

## 2023-08-11 DIAGNOSIS — J690 Pneumonitis due to inhalation of food and vomit: Secondary | ICD-10-CM | POA: Diagnosis not present

## 2023-08-11 LAB — CULTURE, BLOOD (ROUTINE X 2): Special Requests: ADEQUATE

## 2023-08-11 LAB — HEPARIN LEVEL (UNFRACTIONATED)
Heparin Unfractionated: 0.21 [IU]/mL — ABNORMAL LOW (ref 0.30–0.70)
Heparin Unfractionated: 0.19 [IU]/mL — ABNORMAL LOW (ref 0.30–0.70)
Heparin Unfractionated: 0.17 [IU]/mL — ABNORMAL LOW (ref 0.30–0.70)

## 2023-08-11 LAB — COMPREHENSIVE METABOLIC PANEL WITH GFR
ALT: 106 U/L — ABNORMAL HIGH (ref 0–44)
AST: 135 U/L — ABNORMAL HIGH (ref 15–41)
Albumin: <1.5 g/dL — ABNORMAL LOW (ref 3.5–5.0)
Alkaline Phosphatase: 135 U/L — ABNORMAL HIGH (ref 38–126)
Anion gap: 18 — ABNORMAL HIGH (ref 5–15)
BUN: 54 mg/dL — ABNORMAL HIGH (ref 8–23)
CO2: 14 mmol/L — ABNORMAL LOW (ref 22–32)
Calcium: 7.5 mg/dL — ABNORMAL LOW (ref 8.9–10.3)
Chloride: 104 mmol/L (ref 98–111)
Creatinine, Ser: 1.33 mg/dL — ABNORMAL HIGH (ref 0.61–1.24)
GFR, Estimated: 56 mL/min — ABNORMAL LOW (ref >60)
Glucose, Bld: 107 mg/dL — ABNORMAL HIGH (ref 70–99)
Potassium: 4 mmol/L (ref 3.5–5.1)
Sodium: 136 mmol/L (ref 135–145)
Total Bilirubin: 4.9 mg/dL — ABNORMAL HIGH (ref 0.0–1.2)
Total Protein: 3 g/dL — ABNORMAL LOW (ref 6.5–8.1)

## 2023-08-11 LAB — CBC WITH DIFFERENTIAL/PLATELET
Abs Immature Granulocytes: 0 10*3/uL (ref 0.00–0.07)
Band Neutrophils: 2 %
Basophils Absolute: 0.2 10*3/uL — ABNORMAL HIGH (ref 0.0–0.1)
Basophils Relative: 1 %
Eosinophils Absolute: 0.2 10*3/uL (ref 0.0–0.5)
Eosinophils Relative: 1 %
HCT: 21.6 % — ABNORMAL LOW (ref 39.0–52.0)
Hemoglobin: 7 g/dL — ABNORMAL LOW (ref 13.0–17.0)
Lymphocytes Relative: 7 %
Lymphs Abs: 1.2 10*3/uL (ref 0.7–4.0)
MCH: 28.7 pg (ref 26.0–34.0)
MCHC: 32.4 g/dL (ref 30.0–36.0)
MCV: 88.5 fL (ref 80.0–100.0)
Monocytes Absolute: 1 10*3/uL (ref 0.1–1.0)
Monocytes Relative: 6 %
Neutro Abs: 14 10*3/uL — ABNORMAL HIGH (ref 1.7–7.7)
Neutrophils Relative %: 83 %
Platelets: 57 10*3/uL — ABNORMAL LOW (ref 150–400)
RBC: 2.44 MIL/uL — ABNORMAL LOW (ref 4.22–5.81)
RDW: 16.2 % — ABNORMAL HIGH (ref 11.5–15.5)
Smear Review: NORMAL
WBC: 16.5 10*3/uL — ABNORMAL HIGH (ref 4.0–10.5)
nRBC: 0.1 % (ref 0.0–0.2)

## 2023-08-11 LAB — TROPONIN I (HIGH SENSITIVITY): Troponin I (High Sensitivity): 39 ng/L — ABNORMAL HIGH (ref <18)

## 2023-08-11 LAB — LACTIC ACID, PLASMA
Lactic Acid, Venous: 8.3 mmol/L (ref 0.5–1.9)
Lactic Acid, Venous: 8 mmol/L (ref 0.5–1.9)
Lactic Acid, Venous: 8.1 mmol/L (ref 0.5–1.9)

## 2023-08-11 LAB — POCT I-STAT 7, (LYTES, BLD GAS, ICA,H+H)
Acid-base deficit: 10 mmol/L — ABNORMAL HIGH (ref 0.0–2.0)
Acid-base deficit: 9 mmol/L — ABNORMAL HIGH (ref 0.0–2.0)
Bicarbonate: 15 mmol/L — ABNORMAL LOW (ref 20.0–28.0)
Bicarbonate: 15.3 mmol/L — ABNORMAL LOW (ref 20.0–28.0)
Calcium, Ion: 1.1 mmol/L — ABNORMAL LOW (ref 1.15–1.40)
Calcium, Ion: 1.16 mmol/L (ref 1.15–1.40)
HCT: 19 % — ABNORMAL LOW (ref 39.0–52.0)
HCT: 22 % — ABNORMAL LOW (ref 39.0–52.0)
Hemoglobin: 6.5 g/dL — CL (ref 13.0–17.0)
Hemoglobin: 7.5 g/dL — ABNORMAL LOW (ref 13.0–17.0)
O2 Saturation: 99 %
O2 Saturation: 99 %
Patient temperature: 99
Patient temperature: 99.4
Potassium: 3.8 mmol/L (ref 3.5–5.1)
Potassium: 3.9 mmol/L (ref 3.5–5.1)
Sodium: 136 mmol/L (ref 135–145)
Sodium: 137 mmol/L (ref 135–145)
TCO2: 16 mmol/L — ABNORMAL LOW (ref 22–32)
TCO2: 16 mmol/L — ABNORMAL LOW (ref 22–32)
pCO2 arterial: 28.8 mmHg — ABNORMAL LOW (ref 32–48)
pCO2 arterial: 28.9 mmHg — ABNORMAL LOW (ref 32–48)
pH, Arterial: 7.326 — ABNORMAL LOW (ref 7.35–7.45)
pH, Arterial: 7.335 — ABNORMAL LOW (ref 7.35–7.45)
pO2, Arterial: 140 mmHg — ABNORMAL HIGH (ref 83–108)
pO2, Arterial: 154 mmHg — ABNORMAL HIGH (ref 83–108)

## 2023-08-11 LAB — GLUCOSE, CAPILLARY
Glucose-Capillary: 110 mg/dL — ABNORMAL HIGH (ref 70–99)
Glucose-Capillary: 119 mg/dL — ABNORMAL HIGH (ref 70–99)
Glucose-Capillary: 145 mg/dL — ABNORMAL HIGH (ref 70–99)
Glucose-Capillary: 153 mg/dL — ABNORMAL HIGH (ref 70–99)
Glucose-Capillary: 158 mg/dL — ABNORMAL HIGH (ref 70–99)
Glucose-Capillary: 177 mg/dL — ABNORMAL HIGH (ref 70–99)

## 2023-08-11 LAB — ECHOCARDIOGRAM LIMITED
AR max vel: 1.5 cm2
AV Area VTI: 1.57 cm2
AV Area mean vel: 1.57 cm2
AV Mean grad: 8 mmHg
AV Peak grad: 15.4 mmHg
Ao pk vel: 1.96 m/s
Height: 70.984 in
S' Lateral: 3.3 cm
Weight: 3633.18 [oz_av]

## 2023-08-11 LAB — PROTIME-INR
INR: 1.4 — ABNORMAL HIGH (ref 0.8–1.2)
Prothrombin Time: 17.5 s — ABNORMAL HIGH (ref 11.4–15.2)

## 2023-08-11 LAB — HEPATIC FUNCTION PANEL
ALT: 104 U/L — ABNORMAL HIGH (ref 0–44)
AST: 141 U/L — ABNORMAL HIGH (ref 15–41)
Albumin: <1.5 g/dL — ABNORMAL LOW (ref 3.5–5.0)
Alkaline Phosphatase: 141 U/L — ABNORMAL HIGH (ref 38–126)
Bilirubin, Direct: 3.3 mg/dL — ABNORMAL HIGH (ref 0.0–0.2)
Indirect Bilirubin: 1.8 mg/dL — ABNORMAL HIGH (ref 0.3–0.9)
Total Bilirubin: 5.1 mg/dL — ABNORMAL HIGH (ref 0.0–1.2)
Total Protein: 3.3 g/dL — ABNORMAL LOW (ref 6.5–8.1)

## 2023-08-11 LAB — SURGICAL PATHOLOGY

## 2023-08-11 LAB — PREPARE RBC (CROSSMATCH)

## 2023-08-11 LAB — PROCALCITONIN: Procalcitonin: 9.75 ng/mL

## 2023-08-11 LAB — LIPASE, BLOOD: Lipase: 34 U/L (ref 11–51)

## 2023-08-11 MED ORDER — SODIUM CHLORIDE 0.9% IV SOLUTION: Freq: Once | INTRAVENOUS | Status: DC

## 2023-08-11 MED ORDER — TECHNETIUM TC 99M MEBROFENIN IV KIT
7.5000 | PACK | Freq: Once | INTRAVENOUS | Status: AC | PRN
  Administered 2023-08-11: 7.5 via INTRAVENOUS

## 2023-08-11 MED ORDER — HYDROCORTISONE SOD SUC (PF) 100 MG IJ SOLR
100.0000 mg | Freq: Two times a day (BID) | INTRAMUSCULAR | Status: DC
  Administered 2023-08-11 – 2023-08-14 (×8): 100 mg via INTRAVENOUS
  Filled 2023-08-11 (×8): qty 2

## 2023-08-11 MED ORDER — ADENOSINE 6 MG/2ML IV SOLN
12.0000 mg | Freq: Once | INTRAVENOUS | Status: AC
  Administered 2023-08-10: 12 mg via INTRAVENOUS

## 2023-08-11 MED ORDER — ALBUMIN HUMAN 25 % IV SOLN
25.0000 g | Freq: Four times a day (QID) | INTRAVENOUS | Status: DC
Start: 2023-08-11 — End: 2023-08-12
  Administered 2023-08-11 – 2023-08-12 (×3): 25 g via INTRAVENOUS
  Filled 2023-08-11 (×3): qty 100

## 2023-08-11 MED ORDER — SODIUM BICARBONATE 8.4 % IV SOLN
INTRAVENOUS | Status: DC
  Filled 2023-08-11: qty 150
  Filled 2023-08-11 (×5): qty 1000

## 2023-08-11 MED ORDER — ADENOSINE 6 MG/2ML IV SOLN
6.0000 mg | Freq: Once | INTRAVENOUS | Status: AC
  Administered 2023-08-10: 6 mg via INTRAVENOUS

## 2023-08-11 MED ORDER — IOHEXOL 350 MG/ML SOLN
75.0000 mL | Freq: Once | INTRAVENOUS | Status: AC | PRN
  Administered 2023-08-11: 75 mL via INTRAVENOUS

## 2023-08-11 MED ORDER — SODIUM BICARBONATE 8.4 % IV SOLN
50.0000 meq | INTRAVENOUS | Status: AC
  Administered 2023-08-11: 50 meq via INTRAVENOUS

## 2023-08-11 MED ORDER — LACTATED RINGERS IV BOLUS
1000.0000 mL | Freq: Once | INTRAVENOUS | Status: AC
  Administered 2023-08-10: 1000 mL via INTRAVENOUS

## 2023-08-11 NOTE — Progress Notes (Signed)
RT assisted with patient transport to CT and returned to [id number] without complications.

## 2023-08-11 NOTE — Progress Notes (Signed)
 Afternoon round  Down in HIDA  Palliative met with wife. Scheduled 11:30 meeting for tomorrow 4/2 to have ongoing GOC discussion as he is quite ill. Hoping to find source on HIDA for control of septic shock.

## 2023-08-11 NOTE — Progress Notes (Signed)
 Rounding Note    Patient Name: RIGGS DINEEN Date of Encounter: 08/11/2023  [facility] Cardiologist: Donato Schultz, MD   Subjective   Pt intubated and sedated. Family at bedside Converted 0049 to SB in the 50s without significant post conversion pause  Inpatient Medications    Scheduled Meds:  sodium chloride   Intravenous Once   Chlorhexidine Gluconate Cloth  6 each Topical Q0600   vitamin B-12  1,000 mcg Per Tube Daily   docusate  100 mg Per Tube BID   feeding supplement (PROSource TF20)  60 mL Per Tube BID   ferrous sulfate  300 mg Per Tube Daily   folic acid  1 mg Per Tube Daily   hydrocortisone sod succinate (SOLU-CORTEF) inj  100 mg Intravenous Q12H   insulin aspart  0-15 Units Subcutaneous Q4H   insulin aspart  2 Units Subcutaneous Q4H   insulin glargine-yfgn  30 Units Subcutaneous Q1400   leptospermum manuka honey  1 Application Topical Daily   mouth rinse  15 mL Mouth Rinse Q2H   pantoprazole (PROTONIX) IV  40 mg Intravenous QHS   polyethylene glycol  17 g Per Tube Daily   sodium bicarbonate  1,300 mg Per Tube TID   sodium chloride flush  10-40 mL Intracatheter Q12H   thiamine  100 mg Per Tube q AM   Continuous Infusions:  albumin human Stopped (08/11/23 0927)   amiodarone 30 mg/hr (08/11/23 0933)   feeding supplement (OSMOLITE 1.5 CAL)     fentaNYL infusion INTRAVENOUS Stopped (08/10/23 1559)   heparin 1,400 Units/hr (08/11/23 0933)   norepinephrine (LEVOPHED) Adult infusion Stopped (08/11/23 0527)   phenylephrine (NEO-SYNEPHRINE) Adult infusion 50 mcg/min (08/11/23 0933)   piperacillin-tazobactam (ZOSYN)  IV 12.5 mL/hr at 08/11/23 0933   sodium bicarbonate 150 mEq in dextrose 5 % 1,150 mL infusion 50 mL/hr at 08/11/23 0933   vancomycin Stopped (08/10/23 1733)   vasopressin 0.03 Units/min (08/11/23 0933)   PRN Meds: acetaminophen **OR** acetaminophen, albuterol, bisacodyl, EPINEPHrine, fentaNYL, menthol-cetylpyridinium, midazolam,  [DISCONTINUED] ondansetron **OR** ondansetron (ZOFRAN) IV, mouth rinse, phenol, sodium chloride flush   Vital Signs    Vitals:   08/11/23 0845 08/11/23 0900 08/11/23 0915 08/11/23 0930  BP: (!) 144/52 (!) 143/54 (!) 142/59 (!) 142/52  Pulse: (!) 57 (!) 56 (!) 55 (!) 56  Resp: (!) 27 20 (!) 38 (!) 46  Temp:      TempSrc:      SpO2: 100% 100% 100% 100%  Weight:      Height:        Intake/Output Summary (Last 24 hours) at 08/11/2023 0950 Last data filed at 08/11/2023 0933 Gross per 24 hour  Intake 6199.86 ml  Output 2875 ml  Net 3324.86 ml      08/11/2023    4:44 AM 08/10/2023    5:00 AM 08/01/2023    6:28 AM  Last 3 Weights  Weight (lbs) 227 lb 1.2 oz 225 lb 5 oz 191 lb  Weight (kg) 103 kg 102.2 kg 86.637 kg      Telemetry    Converted to sinus bradycardia 0049 with episodes of junctional bradycardia, HR generally in the 50s, post conversion pause was 1.55 sec - Personally Reviewed  ECG    No new tracings - Personally Reviewed  Physical Exam   GEN: intubated, sedated Cardiac: RRR, no murmurs, rubs, or gallops.  Respiratory: intubated GI: Soft, nontender, non-distended  MS: No edema; No deformity. Neuro:  sedated Psych: sedated   Labs  High Sensitivity Troponin:   Recent Labs  Lab 08/01/23 0630 08/01/23 0913 08/11/23 0453  TROPONINIHS 5 4 39*     Chemistry Recent Labs  Lab 08/09/23 1700 08/09/23 1954 08/10/23 0521 08/10/23 1620 08/10/23 2358 08/11/23 0037 08/11/23 0453 08/11/23 0807  NA  --    < > 133* 135 136 136  --  137  K  --    < > 4.3 4.1 3.9 4.0  --  3.8  CL  --   --  107 104  --  104  --   --   CO2  --   --  12* 18*  --  14*  --   --   GLUCOSE  --   --  203* 152*  --  107*  --   --   BUN  --   --  57* 55*  --  54*  --   --   CREATININE  --   --  1.40* 1.30*  --  1.33*  --   --   CALCIUM  --   --  7.8* 7.7*  --  7.5*  --   --   MG 1.8  --  2.1 2.0  --   --   --   --   PROT  --   --  3.5*  --   --  3.0* 3.3*  --   ALBUMIN  --   --  <1.5*   --   --  <1.5* <1.5*  --   AST  --   --  110*  --   --  135* 141*  --   ALT  --   --  138*  --   --  106* 104*  --   ALKPHOS  --   --  150*  --   --  135* 141*  --   BILITOT  --   --  3.9*  --   --  4.9* 5.1*  --   GFRNONAA  --   --  53* 58*  --  56*  --   --   ANIONGAP  --   --  14 13  --  18*  --   --    < > = values in this interval not displayed.    Lipids  Recent Labs  Lab 08/09/23 0533  TRIG 264*    Hematology Recent Labs  Lab 08/10/23 1214 08/10/23 1620 08/10/23 2358 08/11/23 0037 08/11/23 0807  WBC 16.9* 16.8*  --  16.5*  --   RBC 2.64* 2.68*  --  2.44*  --   HGB 7.5* 7.6* 6.5* 7.0* 7.5*  HCT 23.2* 23.8* 19.0* 21.6* 22.0*  MCV 87.9 88.8  --  88.5  --   MCH 28.4 28.4  --  28.7  --   MCHC 32.3 31.9  --  32.4  --   RDW 16.2* 16.3*  --  16.2*  --   PLT 60* 67*  --  57*  --    Thyroid  Recent Labs  Lab 08/07/23 0601  TSH 1.008  FREET4 1.01    BNP Recent Labs  Lab 08/07/23 0243 08/08/23 0635 08/09/23 0533  BNP 27.5 49.5 73.5    DDimer No results for input(s): "DDIMER" in the last 168 hours.   Radiology    ECHOCARDIOGRAM LIMITED Result Date: 08/11/2023    ECHOCARDIOGRAM LIMITED REPORT   Patient Name:   ESTER MABE Date of Exam: 08/11/2023 Medical Rec #:  [id number]  Height:       71.0 in Accession #:    [id number]       Weight:       227.1 lb Date of Birth:  11/10/1950        BSA:          2.225 m Patient Age:    72 years         BP:           143/54 mmHg Patient Gender: M                HR:           56 bpm. Exam Location:  Inpatient Procedure: Limited Echo, Color Doppler and Cardiac Doppler (Both Spectral and            Color Flow Doppler were utilized during procedure). Indications:    Shock  History:        Patient has prior history of Echocardiogram examinations.                 Stroke, Signs/Symptoms:Syncope; Risk Factors:Diabetes, Former                 Smoker and Sleep Apnea.  Sonographer:    Lamont Snowball Referring Phys: [id number] Karl Ito  IMPRESSIONS  1. Left ventricular ejection fraction, by estimation, is 55 to 60%. The left ventricle has normal function. The left ventricle has no regional wall motion abnormalities.  2. Mildly D-shaped septum suggests a degree of RV pressure/volume overload. Right ventricular systolic function is mildly reduced. The right ventricular size is mildly enlarged. Tricuspid regurgitation signal is inadequate for assessing PA pressure.  3. The mitral valve is normal in structure. Trivial mitral valve regurgitation. No evidence of mitral stenosis.  4. The aortic valve is tricuspid. There is moderate calcification of the aortic valve. Aortic valve regurgitation is not visualized. Aortic valve sclerosis/calcification is present, without any evidence of aortic stenosis. Aortic valve mean gradient measures 8.0 mmHg.  5. The inferior vena cava is dilated in size with <50% respiratory variability, suggesting right atrial pressure of 15 mmHg.  6. Limited echo FINDINGS  Left Ventricle: Left ventricular ejection fraction, by estimation, is 55 to 60%. The left ventricle has normal function. The left ventricle has no regional wall motion abnormalities. The left ventricular internal cavity size was normal in size. There is  no left ventricular hypertrophy. Right Ventricle: Mildly D-shaped septum suggests a degree of RV pressure/volume overload. The right ventricular size is mildly enlarged. No increase in right ventricular wall thickness. Right ventricular systolic function is mildly reduced. Tricuspid regurgitation signal is inadequate for assessing PA pressure. Left Atrium: Left atrial size was normal in size. Right Atrium: Right atrial size was normal in size. Pericardium: There is no evidence of pericardial effusion. Mitral Valve: The mitral valve is normal in structure. Mild mitral annular calcification. Trivial mitral valve regurgitation. No evidence of mitral valve stenosis. Tricuspid Valve: The tricuspid valve is normal in  structure. Tricuspid valve regurgitation is mild. Aortic Valve: The aortic valve is tricuspid. There is moderate calcification of the aortic valve. Aortic valve regurgitation is not visualized. Aortic valve sclerosis/calcification is present, without any evidence of aortic stenosis. Aortic valve mean gradient measures 8.0 mmHg. Aortic valve peak gradient measures 15.4 mmHg. Aortic valve area, by VTI measures 1.57 cm. Aorta: The aortic root is normal in size and structure. Venous: The inferior vena cava is dilated in size with less than 50% respiratory variability, suggesting right atrial  pressure of 15 mmHg. LEFT VENTRICLE PLAX 2D LVIDd:         5.00 cm LVIDs:         3.30 cm LV PW:         0.80 cm LV IVS:        0.80 cm LVOT diam:     1.90 cm LV SV:         66 LV SV Index:   30 LVOT Area:     2.84 cm  IVC IVC diam: 2.50 cm AORTIC VALVE AV Area (Vmax):    1.50 cm AV Area (Vmean):   1.57 cm AV Area (VTI):     1.57 cm AV Vmax:           196.00 cm/s AV Vmean:          129.000 cm/s AV VTI:            0.420 m AV Peak Grad:      15.4 mmHg AV Mean Grad:      8.0 mmHg LVOT Vmax:         104.00 cm/s LVOT Vmean:        71.600 cm/s LVOT VTI:          0.232 m LVOT/AV VTI ratio: 0.55  AORTA Ao Root diam: 2.90 cm  SHUNTS Systemic VTI:  0.23 m Systemic Diam: 1.90 cm Dalton McleanMD Electronically signed by Wilfred Lacy Signature Date/Time: 08/11/2023/9:33:29 AM    Final    DG CHEST PORT 1 VIEW Result Date: 08/10/2023 CLINICAL DATA:  Hypoxia EXAM: PORTABLE CHEST 1 VIEW COMPARISON:  X-ray 08/08/2023 and older FINDINGS: Stable ET tube, enteric tube. Placement of a left-sided PICC with tip overlying the central SVC above the right atrium. Presumed loop recorder along the lower left hemithorax. Stable cardiopericardial silhouette. No pneumothorax. Slight increase in small right pleural effusion. Mild adjacent opacity is again noted. No edema. Prominence of the central vasculature. Overlapping cardiac leads. IMPRESSION: New  left-sided PICC with tip along the central SVC above the right atrium. Slight increase in tiny right pleural effusion. Electronically Signed   By: Karen Kays M.D.   On: 08/10/2023 16:00   US Abdomen Limited RUQ (LIVER/GB) Result Date: 08/10/2023 CLINICAL DATA:  Hyperbilirubinemia. EXAM: ULTRASOUND ABDOMEN LIMITED RIGHT UPPER QUADRANT COMPARISON:  08/06/2023 FINDINGS: Gallbladder: Dilated gallbladder with some borderline wall thickening of 3 mm. Several internal echoes consistent with sludge. No shadowing stones. Patient is on a ventilator. Common bile duct: Diameter: 6 mm Liver: No focal lesion identified. Within normal limits in parenchymal echogenicity. Portal vein is patent on color Doppler imaging with normal direction of blood flow towards the liver. Other: Trace ascites. IMPRESSION: Distended gallbladder with borderline wall thickening and sludge. No obvious stones or ductal dilatation. Trace ascites. Electronically Signed   By: Karen Kays M.D.   On: 08/10/2023 15:59   VAS [place] LOWER EXTREMITY VENOUS (DVT) Result Date: 08/09/2023  Lower Venous DVT Study Patient Name:  AHMANI PREHN  Date of Exam:   08/09/2023 Medical Rec #: [id number]         Accession #:    [id number] Date of Birth: 12-14-1950         Patient Gender: M Patient Age:   29 years Exam Location:  [facility] Procedure:      VAS [place] LOWER EXTREMITY VENOUS (DVT) Referring Phys: Scheryl Marten XU --------------------------------------------------------------------------------  Indications: Embolic stroke, prostate cancer, weakness. Other Indications: Factor V Leiden and prothrombin gene mutation. Comparison Study: Previous  study on 5.30.2024. Performing Technologist: Fernande Bras  Examination Guidelines: A complete evaluation includes B-mode imaging, spectral Doppler, color Doppler, and power Doppler as needed of all accessible portions of each vessel. Bilateral testing is considered an integral part of a complete examination. Limited  examinations for reoccurring indications may be performed as noted. The reflux portion of the exam is performed with the patient in reverse Trendelenburg.  +---------+---------------+---------+-----------+----------+-------------------+ RIGHT    CompressibilityPhasicitySpontaneityPropertiesThrombus Aging      +---------+---------------+---------+-----------+----------+-------------------+ CFV      Full           Yes      Yes                                      +---------+---------------+---------+-----------+----------+-------------------+ SFJ      Full           Yes      Yes                                      +---------+---------------+---------+-----------+----------+-------------------+ FV Prox  Full                                                             +---------+---------------+---------+-----------+----------+-------------------+ FV Mid   Full                                                             +---------+---------------+---------+-----------+----------+-------------------+ FV DistalFull                                                             +---------+---------------+---------+-----------+----------+-------------------+ PFV      Full                                                             +---------+---------------+---------+-----------+----------+-------------------+ POP      Full           Yes      Yes                                      +---------+---------------+---------+-----------+----------+-------------------+ PTV                                                   Not well  visualized, patent                                                        by color.           +---------+---------------+---------+-----------+----------+-------------------+ PERO                                                  Not well                                                                   visualized, patent                                                        by color.           +---------+---------------+---------+-----------+----------+-------------------+ Gastroc  Partial        No       No                                       +---------+---------------+---------+-----------+----------+-------------------+   +---------+---------------+---------+-----------+----------+--------------+ LEFT     CompressibilityPhasicitySpontaneityPropertiesThrombus Aging +---------+---------------+---------+-----------+----------+--------------+ CFV      Full           Yes      Yes                                 +---------+---------------+---------+-----------+----------+--------------+ SFJ      Full           Yes      Yes                                 +---------+---------------+---------+-----------+----------+--------------+ FV Prox  Full                                                        +---------+---------------+---------+-----------+----------+--------------+ FV Mid   Full                                                        +---------+---------------+---------+-----------+----------+--------------+ FV DistalFull                                                        +---------+---------------+---------+-----------+----------+--------------+  PFV      Full                                                        +---------+---------------+---------+-----------+----------+--------------+ POP      Full           Yes      Yes                                 +---------+---------------+---------+-----------+----------+--------------+ PTV      Full                                                        +---------+---------------+---------+-----------+----------+--------------+ PERO     Full                                                         +---------+---------------+---------+-----------+----------+--------------+     Summary: RIGHT: 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 - No cystic structure found in the popliteal fossa.  LEFT: - There is no evidence of deep vein thrombosis in the lower extremity.  - No cystic structure found in the popliteal fossa.  *See table(s) above for measurements and observations. Electronically signed by Carolynn Sayers on 08/09/2023 at 1:09:22 PM.    Final     Cardiac Studies   Limited echo 08/11/23:  1. Left ventricular ejection fraction, by estimation, is 55 to 60%. The  left ventricle has normal function. The left ventricle has no regional  wall motion abnormalities.   2. Mildly D-shaped septum suggests a degree of RV pressure/volume  overload. Right ventricular systolic function is mildly reduced. The right  ventricular size is mildly enlarged. Tricuspid regurgitation signal is  inadequate for assessing PA pressure.   3. The mitral valve is normal in structure. Trivial mitral valve  regurgitation. No evidence of mitral stenosis.   4. The aortic valve is tricuspid. There is moderate calcification of the  aortic valve. Aortic valve regurgitation is not visualized. Aortic valve  sclerosis/calcification is present, without any evidence of aortic  stenosis. Aortic valve mean gradient  measures 8.0 mmHg.   5. The inferior vena cava is dilated in size with <50% respiratory  variability, suggesting right atrial pressure of 15 mmHg.   6. Limited echo   Patient Profile     73 y.o. 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 Everardo All.   Assessment & Plan    PSVT Atrial fibrillation with  RVR Initially felt to have SVT --> received adenosine x 3 doses with resultant hypotension and Afib RVR in the 120s. 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 However, decisions regarding DOAC complicated by ongoing anemia. - remains on heparin gtt      Mildly reduced RV function D-shaped septum - on limited echo today   For questions or updates, please contact [facility] Please consult [email or web address] for contact info under        Signed, Marcelino Duster, PA  08/11/2023, 9:50 AM

## 2023-08-11 NOTE — Progress Notes (Signed)
 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   On ED arrival  pt was afebrile, HR:63, BP: 104/47, RR 23. Labs were notable for WBC 7.9, Hgb: 9.0( baseline around 12), Platelets 87, Na: 132, K: 4.1, CO2: 21, Cr, 0.87( Baseline), Transaminases/ ALK Phos WNL, T.bili elevated at 1.9, Mg: 1.4. Troponin : 5--> 4, BNP: 33. FOBT negative, Iron studies consistent with deficiency, B12 low. TSH/T4 WNL. UA grossly normal. RVP negative. CXR no acute, chronic right base changes.  Admitted to [facility]. GI was consulted for anemia in the setting of DAPT with recommendation for BID PPI and EGD to r/o occult GIB. EGD completed 3/23 with mild gastritis, no evidence of active bleeding. Neurology consulted(3/25) for new onset confusion. CT head completed 3/25 with NAICA, new small chronic cortical/ subcortical infarct R frontal lobe ( new from prior), known small chronic infarcts, L frontal/ L parietal/ R temporal lobes. EEG (3/25) showed mild diffuse encephalopathy, no seizures. F/u MRI brain (3/25) showed many small acute infarcts concerning for embolic etiology. MRI C-Spine with severe canal stenosis concerning for edema and compressive myelopathy, severe left/moderate right foraminal stenosis, edema of left T1 inferior articular facet. MRI T-spine negative. Given patients progressive weakness in the setting of known C-Spine compression, NSG consulted for  evaluation 3/26 with recommendation for eventual cord decompression once optimized from a medical standpoint.Additionally, psychiatry was consulted for recommendations on medication management for depressed mood. Cardiology was consulted 3/27 for preoperative cardiac risk stratification.  On 3/28AM, patient was reportedly hot to touch per RN with increased RR and WOB and Tmax 103F. CXR obtained 3/28 stable . WBC slightly elevated to 10.8 , INR 1.4, K 3.3, CO2 21. PCT 1.35 ( 0.65), LA 2.2 with repeat >9.   PCCM consulted for concern for developing sepsis, acute liver injury.  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 for generalized weakness/poor PO intake 3/25 - Neuro consult for weakness/confusion x 2 days. CT Head/MRI Brain obtained. MRI C-spine and T-spine completed. 3/26 - NSGY evaluation for ?decompression surgery.  3/27 - Cards consult for preoperative risk stratification for possible cord decompression  surgery, tentatively 3/31. 3/28 - Early AM, febrile to Tmax 103F with increased RR/WOB and ?aspiration; LA 2.2 with mild WBC elevation to 10.8; CXR stable. LA > 9 prompted PCCM consult (aberrant value, repeat 2.1). LFTs elevated. Worsening mental status/drowsiness. 3/29  Worsening mental status. Intubated for airway protection. MRI with new 5 mm acute infarct in left pons and numerous acute/subacute infarcts without interval progression 4/1: SVT overnight received adenosine. Followed by aflutter received amio bolus and gtt. In sinus in AM. Ongoing profound septic shock on 3 vasopressors, bicarb gtt. Unclear source but high suspicion for biliary - going for HIDA scan   Interim History / Subjective:  Moves eye lids to verbal stimulus but does not wake or follow commands. Wide pulse pressure with MAP 50s needing multiple vasopressors. Ongoing lactic acidosis >8. Getting HIDA scan today as bili continues to rise and no other source identified.   Objective:  Blood pressure (!) 128/49, pulse (!) 54, temperature 99 F (37.2 C), temperature source Axillary, resp. rate (!) 43, height 5' 10.98" (1.803 m), weight 103 kg, SpO2 99%.    Vent Mode: PRVC FiO2 (%):  [40 %] 40 % Set Rate:  [18 bmp] 18 bmp Vt Set:  [600 mL] 600 mL PEEP:  [5 cmH20] 5 cmH20 Pressure Support:  [10 cmH20] 10 cmH20 Plateau Pressure:  [22 cmH20] 22 cmH20   Intake/Output Summary (Last 24 hours) at 08/11/2023 0732 Last data filed at 08/11/2023 0700 Gross per 24 hour  Intake 5963.3 ml  Output 2665 ml  Net 3298.3 ml   Filed Weights   08/01/23 0628 08/10/23 0500 08/11/23 0444  Weight: 86.6 kg 102.2 kg 103 kg   Physical Exam: General: elderly male, critically ill appearing, laying in bed, no acute distress  HENT: [place]/AT, eyes anicteric, pupils equal but sluggish, ett  Respiratory: vented, minimal settings, clear bilaterally, no wheezing, rhonchi, rales  Cardiovascular: s1s2, bradycardia, no murmur, rub, gallop GI: rounded, soft, non distended, +BS Extremities: anasarca  Neuro: moves eyelids to stimulation, does not open eyes or follow commands   Resolved Hospital Problem List:   Assessment & Plan:  Cryptogenic CVAs, 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 TCD bubble study 2/15 with "small, clinically insignificant PFO". 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 Failed Plavix and Brilinta - stroke team consulted, appreciate management  - IV heparin for now  - no aspirin per neuro due to worsening thrombocytopenia  - con't hold statin, zetia with liver injury  - serial neuro checks - neuroprotective measures - mirtazapine stopped with encephalopathy - anticipate SNF  - not appropriate for PT/OT at this time  Acute hypoxemic respiratory failure secondary to aspiration in setting of recurrent strokes - full mechanical vent support - lung protective ventilation 6-8cc/kg Vt - VAP and PAD bundle in place  - titrate FiO2 to sat goal >92  - maintain peak/plats <30, driving pressures <16    Septic shock, unknown etiology, suspect biliary  Lactic Acidosis  Initial 1/4 bottles blood cultures +MRSE, repeat negative. Increasing lactic acidosis up to 8. No obvious pneumonia. Has had repeat CT abdominal scans negative for acute findings. Urine x2 negative. Has sacral wound but doubt this as etiology. 3/31 with RUQ US showing borderline wall thickening, stones and sludge without ductal dilation.  - con't vancomycin, zosyn  for now  - follow culture data  - HIDA scan 4/1 @ 2PM  - if HIDA negative ? Need to TEE with embolic strokes and septic shock  - con't vaso, levo, neo for MAP > 65  - add hydrocortisone 100mg  q2h  - add bicarb gtt  - add scheduled albumin  - trend lactic, fever, wbc curve   Elevated LFTs  Hyperbilirubinemia-- worsening Differential includes embolic concern (PVT versus Budd-Chiari, given history of clotting disorder), DILI, acute hepatitis, sepsis, cholecystitis, cholangitis. RUQ as above. Discussed with Dr. Miles Costain with IR  4/1 AM who deferred empiric bili drain prior to HIDA scan. 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 MRI T-spine negative. - NSGY consulted- tentative plan for cord decompression, waiting on improvement in overall medical status. Anticipate it will be at least a few more days with ongoing metabolic disarray.  -PT, OT when stable  Anemia, multifactorial in the setting of critical illness, B12 deficiency, iron deficiency -transfuse for Hb <7 or hemodynamically significant bleeding -iron, thiamine, folace, B12 supplements  T2DM - SSI PRN -goal BG 140-180 - con't glargine 30 units daily  History of diverticulosis - Noted history  Hard of hearing, congenital -hearing aids available   GOC: palliative consulted 4/1. I updated the wife and sister at bedside about how critically ill patient is. Discussed plan for today including HIDA. Discussed that he is on 3 vasopressors, off sedation and not waking up. The wife is very adamant that we will get him back to baseline health. The sister seems to be more reasonable and understanding of tenuous status. Consulting palliative to assist with GOC discussion. Given his age and co morbidities and current status would likely be reasonable to have DNR Status if he decompensates further.    Best Practice: (right click and "Reselect all SmartList Selections" daily)   Diet/type: tubefeeds and NPO DVT prophylaxis: systemic heparin GI prophylaxis: PPI Lines: PICC Foley:  yes Code Status:  full code Last date of multidisciplinary goals of care discussion: see GOC above  Critical care time:  35  Cristopher Peru, PA-C [facility] 08/11/23 11:34 AM  Please see [email or web address] for pager details.  From 7A-7P if no response, please call [phone] After hours, please call ELink [phone]

## 2023-08-11 NOTE — Progress Notes (Signed)
 Personally reviewed CT scans. Bibasilar opacities in the lungs suggests pneumonia. Chronic pleural abnormalities on the right seen on CXR from several years ago but no previous CT scans recently for comparison. No obvious source of uncontrolled sepsis; awaiting official reads. Unfortunately LA is still 8.1 and he is hypervolemic. Blood cultures 3/31 not revealing. Interestingly sputum is growing staph epi, which his original blood cultures grew. May not have been contaminant, but should be adequately covered by zosyn & vanc.  Elink to follow up CT reads.  Steffanie Dunn, DO 08/11/23 7:10 PM [facility]  For contact information, see Amion. If no response to pager, please call PCCM consult pager. After hours, 7PM- 7AM, please call Elink.

## 2023-08-11 NOTE — Progress Notes (Signed)
 HIDA reviewed and discussed with Dr. Fredia Sorrow-- not a diagnostic study, recommends pan-CT rather than biliary intervention.  RN to recollect LA.  Steffanie Dunn, DO 08/11/23 5:32 PM [facility]  For contact information, see Amion. If no response to pager, please call PCCM consult pager. After hours, 7PM- 7AM, please call Elink.

## 2023-08-11 NOTE — Progress Notes (Signed)
 OT Cancellation Note  Patient Details Name: Kenneth [name] MRN: [id number] DOB: 23-Jan-1951   Cancelled Treatment:    Reason Eval/Treat Not Completed: Medical issues which prohibited therapy.  Will continue to follow and resume when stable   [name] 08/11/2023, 10:15 AM Hal Neer OTR/L

## 2023-08-11 NOTE — Progress Notes (Signed)
 Nutrition Follow-up  DOCUMENTATION CODES:   Severe malnutrition in context of chronic illness  INTERVENTION:   Continue tube feeding via OGT when medically feasible: Osmolite 1.5 at 60 ml/h (1440 ml per day) Prosource TF20 60 ml BID  Provides 2320 kcal, 130 gm protein, and 1097 mL free water daily.   Continue Vitamin B-12, thiamine, folic acid daily    NUTRITION DIAGNOSIS:   Severe Malnutrition related to chronic illness as evidenced by percent weight loss, moderate fat depletion, moderate muscle depletion.  - Still applicable   GOAL:   Patient will meet greater than or equal to 90% of their needs  - Progressing   MONITOR:   Diet advancement  REASON FOR ASSESSMENT:   Malnutrition Screening Tool    ASSESSMENT:   PMH: CVA in May 2024, OSA, T2DM, HTN, prostate cancer,   Admitted with symptomatic anemia. Pt presents with metabolic encephalopathy, generalized weakness. Underlying depression.  3/22 - Admitted 3/28 - NPO 3/29 - Transferred to ICU; Intubated 4/1 - HIDA scan  Patient intubated, off sedation but not waking up. Tube feeds have been off since 3/31 at 9 PM for HIDA scan today. Concern for cholecystitis due to hyperbilirubinemia and sepsis. On levo, neo, and vaso for MAP > 65. Poor prognosis per MD.     +2 edema RUE, LUE, sacral, +3 edema RLE, LLE - Needs diuresis but limited due to pressor requirements   MV: 12.7 L/min, Levo, Vaso, Neo Temp (24hrs), Avg:99.4 F (37.4 C), Min:97.1 F (36.2 C), Max:102.5 F (39.2 C)  MAP (a-line): 67 mmHg  Admit weight: 86.6 kg  Current weight: 103 kg    Intake/Output Summary (Last 24 hours) at 08/11/2023 1441 Last data filed at 08/11/2023 1300 Gross per 24 hour  Intake 5333.75 ml  Output 2225 ml  Net 3108.75 ml   Net IO Since Admission: 9,047.1 mL [08/11/23 1441]  UOP: 2565 ml x 24 hours  Drains/Lines: OGT 100 ml x 24 hours   Nutritionally Relevant Medications: Scheduled Meds:  vitamin B-12  1,000 mcg Per  Tube Daily   docusate  100 mg Per Tube BID   feeding supplement (PROSource TF20)  60 mL Per Tube BID   ferrous sulfate  300 mg Per Tube Daily   folic acid  1 mg Per Tube Daily   insulin aspart  0-15 Units Subcutaneous Q4H   insulin aspart  2 Units Subcutaneous Q4H   insulin glargine-yfgn  30 Units Subcutaneous Q1400   sodium bicarbonate  1,300 mg Per Tube TID   thiamine  100 mg Per Tube q AM   Continuous Infusions:  albumin human Stopped (08/11/23 0927)   amiodarone 30 mg/hr (08/11/23 0933)   feeding supplement (OSMOLITE 1.5 CAL)     fentaNYL infusion INTRAVENOUS Stopped (08/10/23 1559)   heparin 1,400 Units/hr (08/11/23 0933)   norepinephrine (LEVOPHED) Adult infusion Stopped (08/11/23 0527)   phenylephrine (NEO-SYNEPHRINE) Adult infusion 50 mcg/min (08/11/23 0933)   piperacillin-tazobactam (ZOSYN)  IV 3.375 g (08/11/23 1156)   sodium bicarbonate 150 mEq in dextrose 5 % 1,150 mL infusion 50 mL/hr at 08/11/23 0933   vancomycin Stopped (08/10/23 1733)   vasopressin 0.03 Units/min (08/11/23 0933)   Labs Reviewed: BUN 54, Creatinine 1.33, Calcium 7.5, Alkaline phosphatase 141, Albumin <1.5, AST 141, ALT, 104, total bilirubin 5.1, Vitamin B-12 142, HGB 7.5 CBG ranges from 103-194 mg/dL over the last 24 hours HgbA1c 5.3   Diet Order:   Diet Order  Diet NPO time specified Except for: Sips with Meds  Diet effective now                   EDUCATION NEEDS:   Education needs have been addressed  Skin:  Skin Assessment: Skin Integrity Issues: Skin Integrity Issues:: DTI, Other (Comment) DTI: Lip, left heel Other: Incontinence associated dermatitis right buttocks  Last BM:  08/11/23, type 7, medium  Height:   Ht Readings from Last 1 Encounters:  08/08/23 5' 10.98" (1.803 m)    Weight:   Wt Readings from Last 1 Encounters:  08/11/23 103 kg    Ideal Body Weight:  77.7 kg  BMI:  Body mass index is 31.68 kg/m.  Estimated Nutritional Needs:   Kcal:   2200-2600  Protein:  120-140  Fluid:  1 mL/kcal or per MD  Elliot Dally, RD Registered Dietitian  See Amion for more information

## 2023-08-11 NOTE — Consult Note (Signed)
 Palliative Medicine Inpatient Consult Note  Consulting Provider: Lenard [name]   Reason for consult:   Palliative Care Consult Services Palliative Medicine Consult  Reason for Consult? need assistance with family convos - expectations   08/11/2023  HPI:  Per intake H&P --> Kenneth [name] is a 73 y.o. male with a hx of recurrent CVAs, HTN, HLD, IDDMII, hypercoagulable state (heterozygous for Factor V Leiden, Prothrombin Gene Mutation and Protein S deficiency), cervical spinal stenosis, IDA, prostate CA, OSA, BPH. Came from home for generalized weakness and was identified to be having recurrent strokes.    Clinical Assessment/Goals of Care:  *Please note that this is a verbal dictation therefore any spelling or grammatical errors are due to the "Dragon Medical One" system interpretation.  I have reviewed medical records including EPIC notes, labs and imaging, received report from bedside RN, assessed the patient who is lying in bed critically ill.    I called patients spouse, Kenneth [name] to further discuss diagnosis prognosis, GOC, EOL wishes, disposition and options.   I introduced Palliative Medicine as specialized medical care for people living with serious illness. It focuses on providing relief from the symptoms and stress of a serious illness. The goal is to improve quality of life for both the patient and the family.  Medical History Review and Understanding:  A review of Kenneth [name] history was completed inclusive of multiple CVA's, IDDMII, HTN, cervical spinal stenosis, Prostate Cancer, OSA, BPH, and hypercoagulable state.  Social History:  Kenneth [name] [place] He has been married to his spouse, Kenneth [name] for the past forty four years. He has a sister with whom he is close with. He does not have children. 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 He and his wife enjoy traveling. He is of the [place] faith and attends Kenneth [name].   Functional and Nutritional State:  Preceding hospitalization - per his wife twelve days ago he was fully functional. He did have some gait disturbance leading to him needing a walker. He and his wife have 24/7 aides. He had a variable appetite at home.   Advance Directives:  A detailed discussion was had today regarding advanced directives.  Patient does have a living will though we do not have it on file.   Code Status:  Concepts specific to code status, artifical feeding and hydration, continued IV antibiotics and rehospitalization was had.  The difference between a aggressive medical intervention path  and a palliative comfort care path for this patient at this time was had.   We reviewed that Kenneth [name] is presently intubated. I asked his wife if his heart we to stop would she want [place] to compress on his chest and shock him. I shared this can cause further trauma. Patients wife would like to speak to his sister regarding resuscitation status.  Discussion:  Patients spouse, Kenneth [name] shares that she was caught off guard at the Palliative teams involvement. She states that she knows her husband is very sick. She shares with me that he has had a slight decline since May of last year after suffering a stroke and another stroke in November. She states though that he had all of his mental faculties. She shares he is the most resilient human she has ever met.  I emphasized to Kenneth [name] that Kenneth [name]  is critically ill and the concern that he may not survive this hospitalization. Kenneth [name] expresses understanding of this. She shares concerns that  Kenneth [name] sister may have a tough time with this. She goes on to tell me that his sister has never dealt with a situation like this though she has. I shared that perhaps it would be prudent for [place] all to meet together to further discuss goals.   I did share with her that if Kenneth [name] situation continues to deteriorate we may need to discuss comfort measures. Kenneth [name]  endorses awareness of what these are. She states that she feels Kenneth [name] despite his resilience in the past is "tired". She shares that she is not sure she would want to put him through anymore though wants to weigh his sisters opinions as well.   Kenneth [name] asks if we can call Kenneth [name] to inform his Kenneth [name]. I shared that I would.   Plan to meet tomorrow at 11:30.  Discussed the importance of continued conversation with family and their  medical providers regarding overall plan of care and treatment options, ensuring decisions are within the context of the patients values and GOCs.  Decision Maker: Kenneth [name],Kenneth [name] (Spouse): [phone] (Mobile)   SUMMARY OF RECOMMENDATIONS   Full Code  Plan for family meeting tomorrow at 11:30  Patients wife shares understanding of patient critical illness  I have called patients Kenneth [name] - "Kenneth [name]" as requested by his wife  Ongoing PMT support  Code Status/Advance Care Planning: FULL CODE  Palliative Prophylaxis:  Aspiration, Bowel Regimen, Delirium Protocol, Frequent Pain Assessment, Oral Care, Palliative Wound Care, and Turn Reposition  Additional Recommendations (Limitations, Scope, Preferences): Continue present care  Psycho-social/Spiritual:  Desire for further Chaplaincy support: Yes - Patient has a Kenneth [name] Additional Recommendations: Education on critical illness   Prognosis: Very poor.   Discharge Planning: To be determined.  Vitals:   08/11/23 1140 08/11/23 1145  BP: (!) 114/39 (!) 142/51  Pulse: (!) 56 (!) 57  Resp: (!) 24 (!) 35  Temp:    SpO2: 100% 100%    Intake/Output Summary (Last 24 hours) at 08/11/2023 1304 Last data filed at 08/11/2023 1156 Gross per 24 hour  Intake 5333.75 ml  Output 2125 ml  Net 3208.75 ml   Last Weight  Most recent update: 08/11/2023  4:44 AM    Weight  103 kg (227 lb 1.2 oz)            Gen:  Elderly Caucasian M critically ill HEENT: ETT,  NGT, dry mucous membranes CV: Regular rate and  rhythm PULM: On Mechanical ventilator ABD: soft/nontender EXT: No edema  Neuro: Somnolent  PPS: 10%   This conversation/these recommendations were discussed with patient primary care team, Dr. Chestine Spore  Billing based on MDM: High ______________________________________________________ Kenneth [name] Palliative Medicine Team Team Cell Phone: [phone] Please utilize secure chat with additional questions, if there is no response within 30 minutes please call the above phone number  Palliative Medicine Team providers are available by phone from 7am to 7pm daily and can be reached through the team cell phone.  Should this patient require assistance outside of these hours, please call the patient's attending physician.

## 2023-08-11 NOTE — Progress Notes (Signed)
 PHARMACY - ANTICOAGULATION CONSULT NOTE  Pharmacy Consult for heparin Indication: stroke  Patient Measurements: Height: 5' 10.98" (180.3 cm) Weight: 103 kg (227 lb 1.2 oz) IBW/kg (Calculated) : 75.26 HEPARIN DW (KG): 86.6  Vital Signs: Temp: 97.8 F (36.6 C) (04/01 2000) Temp Source: Axillary (04/01 2000) BP: 165/62 (04/01 2000) Pulse Rate: 57 (04/01 2000)  Labs: Recent Labs    08/09/23 0533 08/09/23 1658 08/10/23 0521 08/10/23 1214 08/10/23 1620 08/10/23 2209 08/10/23 2358 08/11/23 0037 08/11/23 0453 08/11/23 0807 08/11/23 1400 08/11/23 2304  HGB 8.4*   < >  --  7.5* 7.6*  --  6.5* 7.0*  --  7.5*  --   --   HCT 25.5*   < >  --  23.2* 23.8*  --  19.0* 21.6*  --  22.0*  --   --   PLT 71*   < >  --  60* 67*  --   --  57*  --   --   --   --   LABPROT 16.9*  --  17.6*  --   --   --   --  17.5*  --   --   --   --   INR 1.4*  --  1.4*  --   --   --   --  1.4*  --   --   --   --   HEPARINUNFRC  --    < >  --  0.21*  --    < >  --   --  0.21*  --  0.19* 0.17*  CREATININE 1.27*  --  1.40*  --  1.30*  --   --  1.33*  --   --   --   --   TROPONINIHS  --   --   --   --   --   --   --   --  39*  --   --   --    < > = values in this interval not displayed.    Estimated Creatinine Clearance: 59.5 mL/min (A) (by C-G formula based on SCr of 1.33 mg/dL (H)).  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   Was admitted 3/22 for progressive weakness. 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 Pharmacy consulted to dose heparin 3/30.  Heparin level subtherapeutic s/p rate increase to 1550  units/hr. Per RN no issues with the heparin running continuously or signs/symptoms of bleeding. Last Hgb 7.5, plts 57  Goal of Therapy:  Heparin level 0.3-0.5 units/ml Monitor platelets by anticoagulation protocol: Yes   Plan:  Increase heparin gtt to 1700 units/hr F/u 8 hour heparin level  Arabella Merles, PharmD. Clinical Pharmacist 08/11/2023 11:42 PM

## 2023-08-11 NOTE — Progress Notes (Signed)
 PT Cancellation Note  Patient Details Name: Kenneth [name] MRN: [id number] DOB: 10/04/1950   Cancelled Treatment:    Reason Eval/Treat Not Completed: Medical issues which prohibited therapy; noted issues since last PT session.  Patient currently intubated and sedated.  Will follow up when stable and can participate for mobility.   Elray Mcgregor 08/11/2023, 10:02 AM Sheran Lawless, PT Acute Rehabilitation Services Office:[phone] 08/11/2023

## 2023-08-11 NOTE — Progress Notes (Signed)
 PHARMACY - ANTICOAGULATION CONSULT NOTE  Pharmacy Consult for heparin Indication: stroke  Patient Measurements: Height: 5' 10.98" (180.3 cm) Weight: 103 kg (227 lb 1.2 oz) IBW/kg (Calculated) : 75.26 HEPARIN DW (KG): 86.6  Vital Signs: Temp: 97.7 F (36.5 C) (04/01 1200) Temp Source: Axillary (04/01 1200) BP: 143/39 (04/01 1415) Pulse Rate: 50 (04/01 1415)  Labs: Recent Labs    08/09/23 0533 08/09/23 1658 08/10/23 0521 08/10/23 1214 08/10/23 1620 08/10/23 2209 08/10/23 2358 08/11/23 0037 08/11/23 0453 08/11/23 0807 08/11/23 1400  HGB 8.4*   < >  --  7.5* 7.6*  --  6.5* 7.0*  --  7.5*  --   HCT 25.5*   < >  --  23.2* 23.8*  --  19.0* 21.6*  --  22.0*  --   PLT 71*   < >  --  60* 67*  --   --  57*  --   --   --   LABPROT 16.9*  --  17.6*  --   --   --   --  17.5*  --   --   --   INR 1.4*  --  1.4*  --   --   --   --  1.4*  --   --   --   HEPARINUNFRC  --    < >  --  0.21*  --  0.23*  --   --  0.21*  --  0.19*  CREATININE 1.27*  --  1.40*  --  1.30*  --   --  1.33*  --   --   --   TROPONINIHS  --   --   --   --   --   --   --   --  39*  --   --    < > = values in this interval not displayed.    Estimated Creatinine Clearance: 59.5 mL/min (A) (by C-G formula based on SCr of 1.33 mg/dL (H)).  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   Was admitted 3/22 for progressive weakness. 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 Pharmacy consulted to dose heparin 3/30.  Heparin level subtherapeutic s/p cautious rate increase to 1400 units/hr  Goal of Therapy:  Heparin level 0.3-0.5  units/ml Monitor platelets by anticoagulation protocol: Yes   Plan:  Increase heparin gtt to 1550 units/hr F/u 8 hour heparin level  Daylene Posey, PharmD, [facility] Clinical Pharmacist ED Pharmacist Phone # [phone] 08/11/2023 3:38 PM

## 2023-08-11 NOTE — Consult Note (Addendum)
 Cardiology Consultation   Patient ID: Kenneth [name] MRN: [id number]; DOB: 1950-08-27  Admit date: 08/01/2023 Date of Consult: 08/11/2023  PCP:  [name], MD   [facility] Providers Cardiologist:  Donato Schultz, MD        Patient Profile:   Kenneth [name] is a 73 y.o. 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 Everardo All.  History of Present Illness:   The following history was obtained via chart review and 5 discussion with the referring provider given that the patient is intubated and sedated.  Mr. Schiefelbein has had a very complicated clinical course over the past year.  In May 2024 Mr. Crate was admitted to [facility] after suffering acute bilateral ACA infarcts.  During that hospitalization echocardiography was negative for LV thrombus and telemetry showed no evidence of arrhythmia.  He was discharged with an ILR which has not shown any evidence of A-fib or a flutter to date.  The patient was admitted once again in November 2020 for recurrent CVAs in the left frontal, left parietal and right temporal lobes.  He was ultimately discharged on aspirin and Brilinta and a hypercoagulable evaluation was pursued which revealed mutations in factor V Leiden and prothrombin gene and reduced protein S activity.  The patient was again admitted to [facility] on 08/01/2023 with a chief complaint of progressive weakness and was found to have subacute on chronic anemia (hbg 9 from 12.3), dehydration and electrolyte abnormalities.  He underwent EGD with GI on 3/23 which was negative for evidence of bleeding and H. pylori.  Shortly after being admitted the patient developed progressive delirium resulting in the consult  to neurology.  A brain MRI was performed on 3/25 which revealed " many small acute infarcts in the bilateral frontal and parietal lobes, left thalamus, right occipital lobe, and cerebellum" concerning for an embolic etiology and an MRI of the C-spine which revealed "severe canal stenosis of C3-C4 and T2 cord hyperintensity suspicious for edema and compressive myelopathy."  A repeat brain MRI was performed on 3/29 which revealed another new 5 mm acute infarct in the left pons.  On 3/27 the patient developed a fever and hypoxic respiratory failure and was found to be bacteremic with Staph epidermidis.  Critical care was consulted and the patient was intubated for airway protection.  He developed progressive shock with a lactate >6.0 (presumed to be septic in etiology) requiring Levophed and broad-spectrum antibiotics.  Started on heparin in light of his recent CVAs.  Yesterday evening the patient developed a narrow complex tachycardia to the 160s.  He was given adenosine 6 -> 6 -> 12 before breaking to tachycardia in the 120s.  During this episode the patient became more hypotensive and was also febrile.  The patient was started on IV amiodarone and cardiology was consulted for further evaluation.  Current VS are HR low 100s, BP 97/55, and satting 100% on 40% FiO2.  Labs notable for bicarb 18, creatinine 1.3, WBC 16.8, hemoglobin 7.6, platelets 67, lactate 6.3.  Initial ECG showed SVT with subsequent ECG showing A-fib with RVR.  The patient is intubated sedated so is unable to answer ROS questions.   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   History of colon polyps - adenomas 04/03/2010   Hypercoagulable state (HCC)    Factor V Leiden and prothrombin gene mutations   Hyperlipidemia    Hypertension    Iron deficiency  anemia, unspecified    OSA (obstructive sleep apnea)    refusing CPAP   Pneumonia 03/2022   w/effusion   Prostate cancer (HCC)    Protein S deficiency (HCC)    Stroke [facility])     Past Surgical History:  Procedure Laterality Date   ANTERIOR CERVICAL DECOMP/DISCECTOMY FUSION N/A 10/01/2012   Procedure: ANTERIOR CERVICAL DECOMPRESSION/DISCECTOMY FUSION 2 LEVELS;  Surgeon: Tia Alert, MD;  Location: [facility] NEURO ORS;  Service: Neurosurgery;  Laterality: N/A;  Cervical five-six,Cervical six-seven   COLONOSCOPY  09/30/2010, 03/01/2014   diverticulosis, small internal hemorrhoids   COLONOSCOPY W/ POLYPECTOMY  04/03/2010   4 polyps 8-71mm, worst TV adenoma with high-grade dysplasia   ESOPHAGOGASTRODUODENOSCOPY N/A 08/02/2023   Procedure: EGD (ESOPHAGOGASTRODUODENOSCOPY);  Surgeon: [name], MD;  Location: [facility] ENDOSCOPY;  Service: Gastroenterology;  Laterality: N/A;   LOOP RECORDER INSERTION N/A 10/10/2022   Procedure: LOOP RECORDER INSERTION;  Surgeon: Marinus Maw, MD;  Location: [facility] INVASIVE CV LAB;  Service: Cardiovascular;  Laterality: N/A;   NO PAST SURGERIES     POLYPECTOMY     POSTERIOR CERVICAL FUSION/FORAMINOTOMY Left 10/20/2012   Procedure: C/5-6,C/6-7 Lami/Multi level,POSTERIOR CERVICAL FUSION/FORAMINOTOMY C/4-7;  Surgeon: Tia Alert, MD;  Location: [facility] NEURO ORS;  Service: Neurosurgery;  Laterality: Left;  Cervical Five-Six, Cervical Six-Seven Laminectomies, Posterior Cervical Fusion/Foraminotomies Cervical Four through Seven.    TONSILLECTOMY       Home Medications:  Prior to Admission medications   Medication Sig Start Date End Date Taking? Authorizing Provider  aspirin 81 MG chewable tablet Chew 1 tablet (81 mg total) by mouth daily. 04/08/23  Yes Marinda Elk, MD  atorvastatin (LIPITOR) 40 MG tablet Take 1 tablet (40 mg total) by mouth daily. Patient taking differently: Take 40 mg by mouth at bedtime. 10/17/22  Yes Love, Evlyn Kanner, PA-C  benazepril (LOTENSIN) 40  MG tablet Take 40 mg by mouth daily.   Yes [provider]  BRILINTA 90 MG TABS tablet Take 90 mg by mouth 2 (two) times daily. 04/24/23  Yes [provider]  chlorthalidone (HYGROTON) 25 MG tablet Take 25 mg by mouth daily.   Yes [provider]  Cholecalciferol (VITAMIN D3) 1000 units CAPS Take 1,000 Units by mouth in the morning and at bedtime.   Yes [provider]  Continuous Glucose Sensor (FREESTYLE LIBRE 14 DAY SENSOR) MISC Inject 1 Device into the skin every 14 (fourteen) days.   Yes [provider]  escitalopram (LEXAPRO) 20 MG tablet Take 20 mg by mouth daily.   Yes [provider]  ezetimibe (ZETIA) 10 MG tablet Take 10 mg by mouth daily. 01/19/20  Yes [provider]  metFORMIN (GLUCOPHAGE-XR) 500 MG 24 hr tablet Take 500 mg by mouth in the morning and at bedtime.   Yes [provider]  tamsulosin (FLOMAX) 0.4 MG CAPS capsule Take 0.4 mg by mouth daily. 07/31/23  Yes [provider]  TOUJEO SOLOSTAR 300 UNIT/ML Solostar Pen Inject 40 Units into the skin in the morning. 04/08/23  Yes Marinda Elk, MD  [facility] 5 MG/0.5ML  Pen Inject 5 mg into the skin every Monday. Patient not taking: Reported on 08/01/2023    [provider]    Inpatient Medications: Scheduled Meds:  Chlorhexidine Gluconate Cloth  6 each Topical Q0600   vitamin B-12  1,000 mcg Per Tube Daily   docusate  100 mg Per Tube BID   feeding supplement (PROSource TF20)  60 mL Per Tube BID   fentaNYL (SUBLIMAZE) injection  25 mcg Intravenous Once   ferrous sulfate  300 mg Per Tube Daily   folic acid  1 mg Per Tube Daily   insulin aspart  0-15 Units Subcutaneous Q4H   insulin aspart  2 Units Subcutaneous Q4H   insulin glargine-yfgn  30 Units Subcutaneous Q1400   leptospermum manuka honey  1 Application Topical Daily   mouth rinse  15 mL Mouth Rinse Q2H   pantoprazole (PROTONIX) IV  40 mg Intravenous QHS   polyethylene glycol   17 g Per Tube Daily   sodium bicarbonate  1,300 mg Per Tube TID   sodium chloride flush  10-40 mL Intracatheter Q12H   thiamine  100 mg Per Tube q AM   Continuous Infusions:  amiodarone 60 mg/hr (08/10/23 2337)   Followed by   amiodarone     feeding supplement (OSMOLITE 1.5 CAL)     fentaNYL infusion INTRAVENOUS Stopped (08/10/23 1559)   heparin 1,300 Units/hr (08/10/23 2315)   norepinephrine (LEVOPHED) Adult infusion 14 mcg/min (08/10/23 2200)   phenylephrine (NEO-SYNEPHRINE) Adult infusion     piperacillin-tazobactam (ZOSYN)  IV 12.5 mL/hr at 08/10/23 2200   vancomycin Stopped (08/10/23 1733)   vasopressin 0.03 Units/min (08/10/23 2200)   PRN Meds: acetaminophen **OR** acetaminophen, albuterol, bisacodyl, EPINEPHrine, fentaNYL, menthol-cetylpyridinium, midazolam, [DISCONTINUED] ondansetron **OR** ondansetron (ZOFRAN) IV, mouth rinse, phenol, sodium chloride flush  Allergies:    Allergies  Allergen Reactions   Jardiance [Empagliflozin] Other (See Comments)    To the hospital- due to complications   Fluoxetine Other (See Comments)    Prefers to NOT take this   Penicillins Other (See Comments)    Tolerated Unasyn March 2025   Silodosin Other (See Comments)    Vertigo     Social History:   Social History   Socioeconomic History   Marital status: Married    Spouse name: Not on file   Number of children: Not on file   Years of education: Not on file   Highest education level: Not on file  Occupational History   Not on file  Tobacco Use   Smoking status: Former    Current packs/day: 0.00    Average packs/day: 0.3 packs/day for 2.0 years (0.5 ttl pk-yrs)    Types: Cigarettes    Start date: 09/23/1967    Quit date: 09/22/1969    Years since quitting: 53.9   Smokeless tobacco: Never  Vaping Use   Vaping status: Never Used  Substance and Sexual Activity   Alcohol use: No   Drug use: No   Sexual activity: Yes  Other Topics Concern   Not on file  Social History  Narrative   Right handed    Wear glasses   Social Drivers of Health   Financial Resource Strain: Not on file  Food Insecurity: No Food Insecurity (08/01/2023)   Hunger Vital Sign    Worried About Running Out of Food in the Last Year: Never true    Ran Out of Food in the Last Year: Never true  Transportation Needs: No Transportation Needs (08/01/2023)   PRAPARE -  Administrator, Civil Service (Medical): No    Lack of Transportation (Non-Medical): No  Physical Activity: Not on file  Stress: Not on file  Social Connections: Socially Isolated (08/01/2023)   Social Connection and Isolation Panel [NHANES]    Frequency of Communication with Friends and Family: Never    Frequency of Social Gatherings with Friends and Family: Never    Attends Religious Services: Never    Database administrator or Organizations: No    Attends Banker Meetings: Never    Marital Status: Married  Catering manager Violence: Not At Risk (08/01/2023)   Humiliation, Afraid, Rape, and Kick questionnaire    Fear of Current or Ex-Partner: No    Emotionally Abused: No    Physically Abused: No    Sexually Abused: No    Family History:    Family History  Problem Relation Age of Onset   Diabetes Mother    Stomach cancer Neg Hx    Rectal cancer Neg Hx    Pancreatic cancer Neg Hx    Colon cancer Neg Hx    Colon polyps Neg Hx    Esophageal cancer Neg Hx    Prostate cancer Neg Hx    Breast cancer Neg Hx      ROS:  Please see the history of present illness.  All other ROS reviewed and negative.     Physical Exam/Data:   Vitals:   08/11/23 0017 08/11/23 0018 08/11/23 0019 08/11/23 0020  BP:  113/60    Pulse: 98 96 99 99  Resp: (!) 24 12 (!) 29 (!) 33  Temp:      TempSrc:      SpO2: 99% 99% 99% 99%  Weight:      Height:        Intake/Output Summary (Last 24 hours) at 08/11/2023 0041 Last data filed at 08/11/2023 0000 Gross per 24 hour  Intake 5344.29 ml  Output 2240 ml  Net  3104.29 ml      08/10/2023    5:00 AM 08/01/2023    6:28 AM 05/21/2023    7:59 AM  Last 3 Weights  Weight (lbs) 225 lb 5 oz 191 lb 195 lb 8 oz  Weight (kg) 102.2 kg 86.637 kg 88.678 kg     Body mass index is 31.44 kg/m.  General: Critically ill-appearing gentleman who is intubated and sedated HEENT: ETT in place Neck: no JVD at 45 degrees Vascular: No carotid bruits; radial pulses 2+ bilaterally Cardiac:  normal S1, S2; tachycardic with irregularly irregular rhythm; no murmur, rubs or gallops Lungs:  clear to auscultation bilaterally on anterior auscultation, no wheezing, rhonchi or rales  Abd: soft, nontender, nondistended Ext: 2+ pitting edema bilaterally to shins Musculoskeletal: Grossly normal Skin: Cool to touch, jaundice? Neuro: Intubated and sedated Psych: Intubated and sedated  EKG:  The EKG was personally reviewed and demonstrates: SVT and initial ECG in atrial fibrillation for the subsequent ECG        Telemetry:  Telemetry was personally reviewed and demonstrates: Atrial fibrillation with RVR  Relevant CV Studies:  TTE 08/06/23:  IMPRESSIONS     1. Left ventricular ejection fraction, by estimation, is 60 to 65%. The  left ventricle has normal function. The left ventricle has no regional  wall motion abnormalities. Left ventricular diastolic parameters are  consistent with Grade I diastolic  dysfunction (impaired relaxation).   2. Right ventricular systolic function is normal. The right ventricular  size is normal. Tricuspid regurgitation signal  is inadequate for assessing  PA pressure.   3. The mitral valve was not well visualized. No evidence of mitral valve  regurgitation. No evidence of mitral stenosis.   4. The aortic valve was not well visualized. There is mild calcification  of the aortic valve. Aortic valve regurgitation is not visualized. Aortic  valve sclerosis is present, with no evidence of aortic valve stenosis.  Aortic valve Vmax measures  2.19  m/s.   5. The inferior vena cava is normal in size with greater than 50%  respiratory variability, suggesting right atrial pressure of 3 mmHg.   Laboratory Data:  High Sensitivity Troponin:   Recent Labs  Lab 08/01/23 0630 08/01/23 0913  TROPONINIHS 5 4     Chemistry Recent Labs  Lab 08/09/23 0533 08/09/23 1700 08/09/23 1954 08/10/23 0521 08/10/23 1620 08/10/23 2358  NA 134*  --    < > 133* 135 136  K 4.4  --    < > 4.3 4.1 3.9  CL 107  --   --  107 104  --   CO2 19*  --   --  12* 18*  --   GLUCOSE 164*  --   --  203* 152*  --   BUN 46*  --   --  57* 55*  --   CREATININE 1.27*  --   --  1.40* 1.30*  --   CALCIUM 7.6*  --   --  7.8* 7.7*  --   MG 1.8 1.8  --  2.1 2.0  --   GFRNONAA 59*  --   --  53* 58*  --   ANIONGAP 8  --   --  14 13  --    < > = values in this interval not displayed.    Recent Labs  Lab 08/08/23 0635 08/09/23 0107 08/09/23 0533 08/10/23 0521  PROT 4.0*  --  3.9* 3.5*  ALBUMIN 1.6* 1.6* 1.5* <1.5*  AST 376*  --  172* 110*  ALT 361*  --  240* 138*  ALKPHOS 211*  --  195* 150*  BILITOT 1.7*  --  2.8* 3.9*   Lipids  Recent Labs  Lab 08/09/23 0533  TRIG 264*    Hematology Recent Labs  Lab 08/10/23 0054 08/10/23 1214 08/10/23 1620 08/10/23 2358  WBC 16.6* 16.9* 16.8*  --   RBC 2.63* 2.64* 2.68*  --   HGB 7.5* 7.5* 7.6* 6.5*  HCT 23.4* 23.2* 23.8* 19.0*  MCV 89.0 87.9 88.8  --   MCH 28.5 28.4 28.4  --   MCHC 32.1 32.3 31.9  --   RDW 16.1* 16.2* 16.3*  --   PLT 64* 60* 67*  --    Thyroid  Recent Labs  Lab 08/07/23 0601  TSH 1.008  FREET4 1.01    BNP Recent Labs  Lab 08/07/23 0243 08/08/23 0635 08/09/23 0533  BNP 27.5 49.5 73.5    DDimer No results for input(s): "DDIMER" in the last 168 hours.   Radiology/Studies:  DG CHEST PORT 1 VIEW Result Date: 08/10/2023 CLINICAL DATA:  Hypoxia EXAM: PORTABLE CHEST 1 VIEW COMPARISON:  X-ray 08/08/2023 and older FINDINGS: Stable ET tube, enteric tube. Placement of a  left-sided PICC with tip overlying the central SVC above the right atrium. Presumed loop recorder along the lower left hemithorax. Stable cardiopericardial silhouette. No pneumothorax. Slight increase in small right pleural effusion. Mild adjacent opacity is again noted. No edema. Prominence of the central vasculature. Overlapping cardiac leads. IMPRESSION: New left-sided  PICC with tip along the central SVC above the right atrium. Slight increase in tiny right pleural effusion. Electronically Signed   By: Karen Kays M.D.   On: 08/10/2023 16:00   US Abdomen Limited RUQ (LIVER/GB) Result Date: 08/10/2023 CLINICAL DATA:  Hyperbilirubinemia. EXAM: ULTRASOUND ABDOMEN LIMITED RIGHT UPPER QUADRANT COMPARISON:  08/06/2023 FINDINGS: Gallbladder: Dilated gallbladder with some borderline wall thickening of 3 mm. Several internal echoes consistent with sludge. No shadowing stones. Patient is on a ventilator. Common bile duct: Diameter: 6 mm Liver: No focal lesion identified. Within normal limits in parenchymal echogenicity. Portal vein is patent on color Doppler imaging with normal direction of blood flow towards the liver. Other: Trace ascites. IMPRESSION: Distended gallbladder with borderline wall thickening and sludge. No obvious stones or ductal dilatation. Trace ascites. Electronically Signed   By: Karen Kays M.D.   On: 08/10/2023 15:59   VAS [place] LOWER EXTREMITY VENOUS (DVT) Result Date: 08/09/2023  Lower Venous DVT Study Patient Name:  Kenneth [name]  Date of Exam:   08/09/2023 Medical Rec #: [id number]         Accession #:    [id number] Date of Birth: 1951-03-15         Patient Gender: M Patient Age:   53 years Exam Location:  [facility] Procedure:      VAS [place] LOWER EXTREMITY VENOUS (DVT) Referring Phys: Scheryl Marten XU --------------------------------------------------------------------------------  Indications: Embolic stroke, prostate cancer, weakness. Other Indications: Factor V Leiden and prothrombin  gene mutation. Comparison Study: Previous study on 5.30.2024. Performing Technologist: Fernande Bras  Examination Guidelines: A complete evaluation includes B-mode imaging, spectral Doppler, color Doppler, and power Doppler as needed of all accessible portions of each vessel. Bilateral testing is considered an integral part of a complete examination. Limited examinations for reoccurring indications may be performed as noted. The reflux portion of the exam is performed with the patient in reverse Trendelenburg.  +---------+---------------+---------+-----------+----------+-------------------+ RIGHT    CompressibilityPhasicitySpontaneityPropertiesThrombus Aging      +---------+---------------+---------+-----------+----------+-------------------+ CFV      Full           Yes      Yes                                      +---------+---------------+---------+-----------+----------+-------------------+ SFJ      Full           Yes      Yes                                      +---------+---------------+---------+-----------+----------+-------------------+ FV Prox  Full                                                             +---------+---------------+---------+-----------+----------+-------------------+ FV Mid   Full                                                             +---------+---------------+---------+-----------+----------+-------------------+ FV DistalFull                                                             +---------+---------------+---------+-----------+----------+-------------------+  PFV      Full                                                             +---------+---------------+---------+-----------+----------+-------------------+ POP      Full           Yes      Yes                                      +---------+---------------+---------+-----------+----------+-------------------+ PTV                                                    Not well                                                                  visualized, patent                                                        by color.           +---------+---------------+---------+-----------+----------+-------------------+ PERO                                                  Not well                                                                  visualized, patent                                                        by color.           +---------+---------------+---------+-----------+----------+-------------------+ Gastroc  Partial        No       No                                       +---------+---------------+---------+-----------+----------+-------------------+   +---------+---------------+---------+-----------+----------+--------------+ LEFT     CompressibilityPhasicitySpontaneityPropertiesThrombus Aging +---------+---------------+---------+-----------+----------+--------------+ CFV      Full           Yes      Yes                                 +---------+---------------+---------+-----------+----------+--------------+  SFJ      Full           Yes      Yes                                 +---------+---------------+---------+-----------+----------+--------------+ FV Prox  Full                                                        +---------+---------------+---------+-----------+----------+--------------+ FV Mid   Full                                                        +---------+---------------+---------+-----------+----------+--------------+ FV DistalFull                                                        +---------+---------------+---------+-----------+----------+--------------+ PFV      Full                                                        +---------+---------------+---------+-----------+----------+--------------+ POP      Full           Yes      Yes                                  +---------+---------------+---------+-----------+----------+--------------+ PTV      Full                                                        +---------+---------------+---------+-----------+----------+--------------+ PERO     Full                                                        +---------+---------------+---------+-----------+----------+--------------+     Summary: RIGHT: 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 - No cystic structure found in the popliteal fossa.  LEFT: - There is no evidence of deep vein thrombosis in the lower extremity.  - No cystic structure found in the popliteal fossa.  *See table(s) above for measurements and observations. Electronically signed by Carolynn Sayers on 08/09/2023 at 1:09:22 PM.    Final    MR BRAIN WO CONTRAST Result Date: 08/08/2023 CLINICAL DATA:  altered mental status, concerned for recurrent strokes; Stroke, follow up. EXAM: MRI HEAD WITHOUT CONTRAST MRA HEAD WITHOUT CONTRAST TECHNIQUE: Multiplanar, multi-echo pulse sequences of the brain and surrounding structures were acquired  without intravenous contrast. Angiographic images of the Circle of Willis were acquired using MRA technique without intravenous contrast. COMPARISON:  Head MRI 08/04/2023.  Head and neck CTA 04/06/2023. FINDINGS: MRI HEAD FINDINGS Brain: A 5 mm acute infarct in the left paracentral pons is new from the prior MRI. Numerous acute to subacute infarcts are again seen involving cortex and white matter in both cerebral hemispheres as well as the dorsal left thalamus and cerebellum. A few of these may be minimally larger or more conspicuous than on the prior MRI, however no sizable new supratentorial infarct is identified. There is up to mild associated cytotoxic edema without mass effect. No intracranial hemorrhage, mass, midline shift, or extra-axial fluid collection is identified. There is mild  cerebral atrophy. Vascular: Major intracranial vascular flow voids are preserved. Skull and upper cervical spine: Unremarkable bone marrow signal. Sinuses/Orbits: Unremarkable orbits. No significant inflammatory disease in the paranasal sinuses. Clear mastoid air cells. Other: None. MRA HEAD FINDINGS Anterior circulation: The internal carotid arteries are widely patent from skull base to carotid termini. ACAs and MCAs are patent without evidence of a proximal branch occlusion or significant proximal stenosis. No aneurysm is identified. Posterior circulation: The included portions of the intracranial vertebral arteries are patent to the basilar. Patent PICA and SCA origins are visualized bilaterally. The basilar artery is widely patent. Posterior communicating arteries are diminutive or absent. Both PCAs are patent without evidence of a significant proximal stenosis. No aneurysm is identified. Anatomic variants: Aplastic right A1 segment. IMPRESSION: 1. 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 2. 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 3. Negative head MRA. Electronically Signed   By: Sebastian Ache M.D.   On: 08/08/2023 16:38   MR ANGIO HEAD WO CONTRAST Result Date: 08/08/2023 CLINICAL DATA:  altered mental status, concerned for recurrent strokes; Stroke, follow up. EXAM: MRI HEAD WITHOUT CONTRAST MRA HEAD WITHOUT CONTRAST TECHNIQUE: Multiplanar, multi-echo pulse sequences of the brain and surrounding structures were acquired without intravenous contrast. Angiographic images of the Circle of Willis were acquired using MRA technique without intravenous contrast. COMPARISON:  Head MRI 08/04/2023.  Head and neck CTA 04/06/2023. FINDINGS: MRI HEAD FINDINGS Brain: A 5 mm acute infarct in the left paracentral pons is new from the prior MRI. Numerous acute to subacute infarcts are again seen involving cortex and white matter in both cerebral hemispheres as  well as the dorsal left thalamus and cerebellum. A few of these may be minimally larger or more conspicuous than on the prior MRI, however no sizable new supratentorial infarct is identified. There is up to mild associated cytotoxic edema without mass effect. No intracranial hemorrhage, mass, midline shift, or extra-axial fluid collection is identified. There is mild cerebral atrophy. Vascular: Major intracranial vascular flow voids are preserved. Skull and upper cervical spine: Unremarkable bone marrow signal. Sinuses/Orbits: Unremarkable orbits. No significant inflammatory disease in the paranasal sinuses. Clear mastoid air cells. Other: None. MRA HEAD FINDINGS Anterior circulation: The internal carotid arteries are widely patent from skull base to carotid termini. ACAs and MCAs are patent without evidence of a proximal branch occlusion or significant proximal stenosis. No aneurysm is identified. Posterior circulation: The included portions of the intracranial vertebral arteries are patent to the basilar. Patent PICA and SCA origins are visualized bilaterally. The basilar artery is widely patent. Posterior communicating arteries are diminutive or absent. Both PCAs are patent without evidence of a significant proximal stenosis. No aneurysm is identified. Anatomic  variants: Aplastic right A1 segment. IMPRESSION: 1. 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 2. 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 3. Negative head MRA. Electronically Signed   By: Sebastian Ache M.D.   On: 08/08/2023 16:38   US LIVER DOPPLER Result Date: 08/08/2023 CLINICAL DATA:  Elevated LFTs. EXAM: DUPLEX ULTRASOUND OF LIVER TECHNIQUE: Color and duplex Doppler ultrasound was performed to evaluate the hepatic in-flow and out-flow vessels. COMPARISON:  CT a abdomen and pelvis 08/07/2023 FINDINGS: Liver: Liver echogenicity is within normal limits. Trace perihepatic ascites. Liver  contour is within normal limits. No discrete liver lesion. Main Portal Vein size: 1.1 cm Portal Vein Velocities Main Prox:  42 cm/sec Main Mid: 65 cm/sec Main Dist:  55 cm/sec Right: 23 cm/sec Left: 14 cm/sec Hepatic Vein Velocities Right:  39 cm/sec Middle:  38 cm/sec Left:  43 cm/sec IVC: Patent. Hepatic Artery Velocity:  228 cm/sec Splenic Vein Velocity:  52 cm/sec Spleen: 17.0 cm x 5.2 cm x 5.8 cm with a total volume of 267 cm^3 (411 cm^3 is upper limit normal) Portal Vein Occlusion/Thrombus: No Splenic Vein Occlusion/Thrombus: No Ascites: Present.  Trace perihepatic ascites. Varices: None Normal hepatopetal flow in the portal veins. Normal hepatofugal flow in the hepatic veins. IMPRESSION: 1. Portal venous system is patent with normal direction of flow. 2. Perihepatic ascites. Electronically Signed   By: Richarda Overlie M.D.   On: 08/08/2023 14:56   DG Abd 1 View Result Date: 08/08/2023 CLINICAL DATA:  Orogastric tube placement. EXAM: ABDOMEN - 1 VIEW COMPARISON:  None Available. FINDINGS: The bowel gas pattern is normal. Distal tip of nasogastric tube is seen in expected position of proximal stomach. No radio-opaque calculi or other significant radiographic abnormality are seen. IMPRESSION: Distal tip of nasogastric tube is seen in expected position of proximal stomach. Electronically Signed   By: Lupita Raider M.D.   On: 08/08/2023 11:51   DG Chest Port 1 View Result Date: 08/08/2023 CLINICAL DATA:  Intubation. EXAM: PORTABLE CHEST 1 VIEW COMPARISON:  Same day. FINDINGS: Endotracheal and nasogastric tubes appear to be in grossly good position. Stable right-sided pulmonary findings compared to prior exam. IMPRESSION: Endotracheal and nasogastric tubes appear to be in grossly good position. Electronically Signed   By: Lupita Raider M.D.   On: 08/08/2023 11:50   [place] EKG SITE RITE Result Date: 08/08/2023 If Site Rite image not attached, placement could not be confirmed due to current cardiac rhythm.  DG  Chest Port 1 View Result Date: 08/08/2023 CLINICAL DATA:  Shortness of breath. EXAM: PORTABLE CHEST 1 VIEW COMPARISON:  08/07/2023 FINDINGS: Stable cardiomediastinal contours. Loop recorder, unchanged. Persistent right pleural effusion with overlying pleuroparenchymal scarring. Left lung appears clear. No new findings. The visualized osseous structures appear intact. Postoperative changes noted in the cervical spine. IMPRESSION: Persistent right pleural effusion with overlying pleuroparenchymal scarring. Electronically Signed   By: Signa Kell M.D.   On: 08/08/2023 07:56   CT Angio Abd/Pel w/ and/or w/o Result Date: 08/07/2023 CLINICAL DATA:  Hepatic insufficiency EXAM: CT ANGIOGRAPHY ABDOMEN AND PELVIS WITH CONTRAST AND WITHOUT CONTRAST TECHNIQUE: Multidetector CT imaging of the abdomen and pelvis was performed using the liver protocol during bolus administration of intravenous contrast. Because of an IV malfunction, a repeat bolus was required to obtain arterial phase images. Multiplanar reconstructed images and MIPs were obtained and reviewed to evaluate the vascular anatomy. RADIATION DOSE REDUCTION: This exam was performed according to the departmental  dose-optimization program which includes automated exposure control, adjustment of the mA and/or kV according to patient size and/or use of iterative reconstruction technique. CONTRAST:  OMNIPAQUE IOHEXOL 350 MG/ML SOLN COMPARISON:  None Available. FINDINGS: VASCULAR Aorta: Normal caliber aorta without aneurysm, dissection, vasculitis or significant stenosis. Moderate atherosclerotic calcification Celiac: Common origin of the celiac axis and superior mesenteric artery. Wide patency. No aneurysm or dissection. SMA: Common origin of the superior mesenteric artery and celiac axis. Wide patency. No aneurysm or dissection. 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 Right renal artery is widely patent. Normal vascular morphology. No  aneurysm or dissection. Accessory lower pole renal arteries noted bilaterally. IMA: Patent without evidence of aneurysm, dissection, vasculitis or significant stenosis. Inflow: Patent without evidence of aneurysm, dissection, vasculitis or significant stenosis. Proximal Outflow: Bilateral common femoral and visualized portions of the superficial and profunda femoral arteries are patent without evidence of aneurysm, dissection, vasculitis or significant stenosis. Veins: Unremarkable.  Portal and hepatic veins are patent. Review of the MIP images confirms the above findings. 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 Mild cardiomegaly. Hepatobiliary: Cholelithiasis without superimposed pericholecystic inflammatory change. Liver unremarkable; no enhancing intrahepatic mass identified. No intra or extrahepatic biliary ductal dilation. Pancreas: Unremarkable Spleen: The previously noted hypodense lesion within the upper pole of the spleen demonstrates progressive enhancement on delayed phase images but remains hypoenhancing in relation to adjacent splenic parenchyma and differential considerations include low-grade lymphoma or atypical hamartoma. Adrenals/Urinary Tract: Bilateral adrenal nodules are again identified and are indeterminate due to technical artifact related repeat bolus imaging. These are better assessed on prior examination 08/06/2023. 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 The kidneys are otherwise unremarkable save for a simple cortical cyst within the upper pole the right kidney for which no follow-up imaging is recommended. The bladder is decompressed. Stomach/Bowel: Stomach is within normal limits. Appendix appears normal. No evidence of bowel wall thickening, distention, or inflammatory changes. Trace ascites no free intraperitoneal gas. Lymphatic: No  pathologic adenopathy within the abdomen and pelvis. Mild retroperitoneal edema again noted. Reproductive: Mild prostatic hypertrophy. Other: Mild diffuse subcutaneous body wall edema. Small bilateral fat containing inguinal hernias. Musculoskeletal: No acute bone abnormality. No lytic or blastic lesion. IMPRESSION: 1. No acute intra-abdominal pathology identified. No definite radiographic explanation for the patient's reported symptoms. 2. 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 3. Patency of the hepatic arterial and venous structures. 4. Cholelithiasis. 5. Stable mild left UPJ obstruction. 6. Bilateral adrenal nodules are again identified and are indeterminate due to technical artifact related repeat bolus imaging. These are better assessed on prior examination 08/06/2023. Dedicated contrast enhanced MRI examination is recommended. 7. 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 8. Mild cardiomegaly. 9. The previously noted hypodense lesion within the upper pole of the spleen demonstrates progressive enhancement on delayed phase images but remains hypoenhancing in relation to adjacent splenic parenchyma and differential considerations include low-grade lymphoma or atypical hamartoma. This could be simultaneously assessed with contrast enhanced MRI examination. Aortic Atherosclerosis (ICD10-I70.0). Electronically Signed   By: Helyn Numbers M.D.   On: 08/07/2023 22:01   US THYROID Result Date: 08/07/2023 CLINICAL DATA:  Nodule posterior right on recent CT chest EXAM: THYROID ULTRASOUND TECHNIQUE: Ultrasound examination of the thyroid gland and adjacent soft tissues was performed. COMPARISON:  CT 08/06/2023  FINDINGS: Parenchymal Echotexture: Mildly heterogenous Isthmus: 0.6 cm thickness Right lobe: 3.6 x 1.2 x 1.7 cm Left lobe: 3.5 x 1.7 x 1.7 cm _________________________________________________________ Estimated total number of  nodules >/= 1 cm: 0 Number of spongiform nodules >/=  2 cm not described below (TR1): 0 Number of mixed cystic and solid nodules >/= 1.5 cm not described below (TR2): 0 _________________________________________________________ Technologist describes technically difficult study, limited secondary to patient inability to extend neck. No discrete nodules are seen within the thyroid gland. No regional cervical adenopathy demonstrated. IMPRESSION: Negative limited study. The posterior right thyroid nodule described on CT was not identified. Electronically Signed   By: Corlis Leak M.D.   On: 08/07/2023 14:04   DG CHEST PORT 1 VIEW Result Date: 08/07/2023 CLINICAL DATA:  Follow-up right basilar opacity EXAM: PORTABLE CHEST 1 VIEW COMPARISON:  08/05/2023 FINDINGS: Cardiac shadow is within normal limits. Persistent small right effusion and basilar atelectasis is noted. Left lung remains clear. No bony abnormality is seen. Postsurgical changes in the cervical spine are noted. IMPRESSION: Stable right basilar opacity and effusion. Electronically Signed   By: Alcide Clever M.D.   On: 08/07/2023 02:25     Assessment and Plan:   LENIN KUHNLE is a 73 y.o. 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 Everardo All.  #PSVT #New Atrial Fibrillation with RVR :: CHA2DS2-VASc score = 5. Cardiology is consulted for assistance with managing new SVT and atrial fibrillation with RVR.  Unfortunately, the patient is critically ill and thus has many potential drivers for his atrial arrhythmias.  He is on Levophed, septic, and is very anemic, all of which came precipitate SVT and atrial fibrillation.  Despite these arrhythmias being provoked from critical illness, his CHA2DS2-VASc  score is very high which necessitates long-term OAC (especially given his history of recurrent multiple CVAs).  My concern at the moment however is that his hemoglobin continues to decline and he is very thrombocytopenic.  For now I agree with continuing heparin as long as there are no overt signs of bleeding given his risk for recurrent strokes.  Would also recommend reengaging GI to see if colonoscopy is warranted given his ongoing anemia.  I agree with the initiation of an amiodarone load given his rhythm disturbances with daily LFTs to ensure no worsening hepatic function.  Lastly, we will order a limited echo to ensure stability of cardiac function given his clinical change. -Continue amiodarone for 5 g load -Continue heparin with caution given the lability of his hemoglobin levels -Consider reengaging GI for consideration of colonoscopy if he becomes more clinically stable -Limited echo for repeat EF assessment given change in clinical status -Ultimately he will need long term OAC pending clinical improvement -daily hepatic function panels while on amiodarone  #Recurrent CVAs of Unclear Etiology :: Complicated history of multiple recurrent CVAs over the past year and during this hospitalization.  Concern for embolic phenomenon vs hypercoagulable state resulting in CVAs.  Bubble study from May 2024 was negative, but may be worthwhile to pursue TEE for PFO assessment in the future if the patient improves clinically.  At the moment he is too ill for a TEE procedure.  If he needs antiplatelet therapy for his CVAs in addition to LHC, would favor Plavix +  DOAC given reduced bleeding risk with combination.  However, continue heparin for now as described above.   Risk Assessment/Risk Scores:          CHA2DS2-VASc Score = 5  This indicates a 7.2% annual risk of stroke.       For questions or updates, please contact [facility] Please consult [email or web address] for contact info under     Signed, Karl Ito, MD  08/11/2023 12:41 AM

## 2023-08-11 NOTE — Progress Notes (Signed)
 Pt transported to and from [facility] without event.

## 2023-08-11 NOTE — Progress Notes (Signed)
 STROKE TEAM PROGRESS NOTE   INTERIM HISTORY/SUBJECTIVE Wife, sister and other families are at the bedside.  Patient is still intubated, less responsive. Pt is off sedation but needed now 3 pressors for shock, continued to have worsening anemia and thrombocytopenia. On heparin IV. Still has high lactic acid and leukocytosis. Received PRBC overnight, and on amiodarone for afib which has been converted to NSR this am.    OBJECTIVE  CBC    Component Value Date/Time   WBC 16.5 (H) 08/11/2023 0037   RBC 2.44 (L) 08/11/2023 0037   HGB 7.5 (L) 08/11/2023 0807   HGB 12.3 (L) 05/01/2023 1204   HCT 22.0 (L) 08/11/2023 0807   PLT 57 (L) 08/11/2023 0037   PLT 249 05/01/2023 1204   MCV 88.5 08/11/2023 0037   MCH 28.7 08/11/2023 0037   MCHC 32.4 08/11/2023 0037   RDW 16.2 (H) 08/11/2023 0037   LYMPHSABS 1.2 08/11/2023 0037   MONOABS 1.0 08/11/2023 0037   EOSABS 0.2 08/11/2023 0037   BASOSABS 0.2 (H) 08/11/2023 0037    BMET    Component Value Date/Time   NA 137 08/11/2023 0807   NA 142 04/06/2017 1057   K 3.8 08/11/2023 0807   CL 104 08/11/2023 0037   CO2 14 (L) 08/11/2023 0037   GLUCOSE 107 (H) 08/11/2023 0037   BUN 54 (H) 08/11/2023 0037   BUN 13 04/06/2017 1057   CREATININE 1.33 (H) 08/11/2023 0037   CREATININE 0.94 05/01/2023 1204   CALCIUM 7.5 (L) 08/11/2023 0037   GFRNONAA 56 (L) 08/11/2023 0037   GFRNONAA >60 05/01/2023 1204    IMAGING past 24 hours ECHOCARDIOGRAM LIMITED Result Date: 08/11/2023    ECHOCARDIOGRAM LIMITED REPORT   Patient Name:   Kenneth [name] Date of Exam: 08/11/2023 Medical Rec #:  [id number]        Height:       71.0 in Accession #:    [id number]       Weight:       227.1 lb Date of Birth:  Jun 20, 1950        BSA:          2.225 m Patient Age:    73 years         BP:           143/54 mmHg Patient Gender: M                HR:           56 bpm. Exam Location:  Inpatient Procedure: Limited Echo, Color Doppler and Cardiac Doppler (Both Spectral and            Color  Flow Doppler were utilized during procedure). Indications:    Shock  History:        Patient has prior history of Echocardiogram examinations.                 Stroke, Signs/Symptoms:Syncope; Risk Factors:Diabetes, Former                 Smoker and Sleep Apnea.  Sonographer:    Lamont Snowball Referring Phys: [id number] Karl Ito IMPRESSIONS  1. Left ventricular ejection fraction, by estimation, is 55 to 60%. The left ventricle has normal function. The left ventricle has no regional wall motion abnormalities.  2. Mildly D-shaped septum suggests a degree of RV pressure/volume overload. Right ventricular systolic function is mildly reduced. The right ventricular size is mildly enlarged. Tricuspid regurgitation signal is inadequate for assessing PA pressure.  3. The mitral valve is normal in structure. Trivial mitral valve regurgitation. No evidence of mitral stenosis.  4. The aortic valve is tricuspid. There is moderate calcification of the aortic valve. Aortic valve regurgitation is not visualized. Aortic valve sclerosis/calcification is present, without any evidence of aortic stenosis. Aortic valve mean gradient measures 8.0 mmHg.  5. The inferior vena cava is dilated in size with <50% respiratory variability, suggesting right atrial pressure of 15 mmHg.  6. Limited echo FINDINGS  Left Ventricle: Left ventricular ejection fraction, by estimation, is 55 to 60%. The left ventricle has normal function. The left ventricle has no regional wall motion abnormalities. The left ventricular internal cavity size was normal in size. There is  no left ventricular hypertrophy. Right Ventricle: Mildly D-shaped septum suggests a degree of RV pressure/volume overload. The right ventricular size is mildly enlarged. No increase in right ventricular wall thickness. Right ventricular systolic function is mildly reduced. Tricuspid regurgitation signal is inadequate for assessing PA pressure. Left Atrium: Left atrial size was normal in  size. Right Atrium: Right atrial size was normal in size. Pericardium: There is no evidence of pericardial effusion. Mitral Valve: The mitral valve is normal in structure. Mild mitral annular calcification. Trivial mitral valve regurgitation. No evidence of mitral valve stenosis. Tricuspid Valve: The tricuspid valve is normal in structure. Tricuspid valve regurgitation is mild. Aortic Valve: The aortic valve is tricuspid. There is moderate calcification of the aortic valve. Aortic valve regurgitation is not visualized. Aortic valve sclerosis/calcification is present, without any evidence of aortic stenosis. Aortic valve mean gradient measures 8.0 mmHg. Aortic valve peak gradient measures 15.4 mmHg. Aortic valve area, by VTI measures 1.57 cm. Aorta: The aortic root is normal in size and structure. Venous: The inferior vena cava is dilated in size with less than 50% respiratory variability, suggesting right atrial pressure of 15 mmHg. LEFT VENTRICLE PLAX 2D LVIDd:         5.00 cm LVIDs:         3.30 cm LV PW:         0.80 cm LV IVS:        0.80 cm LVOT diam:     1.90 cm LV SV:         66 LV SV Index:   30 LVOT Area:     2.84 cm  IVC IVC diam: 2.50 cm AORTIC VALVE AV Area (Vmax):    1.50 cm AV Area (Vmean):   1.57 cm AV Area (VTI):     1.57 cm AV Vmax:           196.00 cm/s AV Vmean:          129.000 cm/s AV VTI:            0.420 m AV Peak Grad:      15.4 mmHg AV Mean Grad:      8.0 mmHg LVOT Vmax:         104.00 cm/s LVOT Vmean:        71.600 cm/s LVOT VTI:          0.232 m LVOT/AV VTI ratio: 0.55  AORTA Ao Root diam: 2.90 cm  SHUNTS Systemic VTI:  0.23 m Systemic Diam: 1.90 cm Dalton McleanMD Electronically signed by Wilfred Lacy Signature Date/Time: 08/11/2023/9:33:29 AM    Final    US Abdomen Limited RUQ (LIVER/GB) Result Date: 08/10/2023 CLINICAL DATA:  Hyperbilirubinemia. EXAM: ULTRASOUND ABDOMEN LIMITED RIGHT UPPER QUADRANT COMPARISON:  08/06/2023 FINDINGS: Gallbladder: Dilated gallbladder with some  borderline wall thickening of 3 mm. Several internal echoes consistent with sludge. No shadowing stones. Patient is on a ventilator. Common bile duct: Diameter: 6 mm Liver: No focal lesion identified. Within normal limits in parenchymal echogenicity. Portal vein is patent on color Doppler imaging with normal direction of blood flow towards the liver. Other: Trace ascites. IMPRESSION: Distended gallbladder with borderline wall thickening and sludge. No obvious stones or ductal dilatation. Trace ascites. Electronically Signed   By: Karen Kays M.D.   On: 08/10/2023 15:59     Vitals:   08/11/23 1345 08/11/23 1351 08/11/23 1400 08/11/23 1415  BP: (!) 152/54 (!) 124/40 (!) 154/46 (!) 143/39  Pulse: (!) 55 (!) 55 (!) 50 (!) 50  Resp: (!) 22 (!) 21 20 20   Temp:      TempSrc:      SpO2: 100% 100% 100% 100%  Weight:      Height:         PHYSICAL EXAM  Temp:  [97.1 F (36.2 C)-102.5 F (39.2 C)] 97.7 F (36.5 C) (04/01 1200) Pulse Rate:  [47-156] 50 (04/01 1415) Resp:  [0-62] 20 (04/01 1415) BP: (82-154)/(34-74) 143/39 (04/01 1415) SpO2:  [98 %-100 %] 100 % (04/01 1415) Arterial Line BP: (47-149)/(26-51) 121/41 (04/01 1415) FiO2 (%):  [40 %-100 %] 100 % (04/01 1351) Weight:  [960 kg] 103 kg (04/01 0444)  General - Well nourished, well developed,  off sedation.  Ophthalmologic - fundi not visualized due to noncooperation.  Cardiovascular - Regular rate and rhythm.  Neuro - intubated off sedation, eyes closed, not open on voice and not following commands. With eye opening, eyes in mid position, not blinking to visual threat and not tracking to provider, pupils equal size light reflexes sluggish. Corneal reflex weakly present, gag and cough present. Breathing over the vent.  Facial symmetry not able to test due to ET tube.  Tongue protrusion not cooperative. On pain stimulation, no movement in all extremities. Sensation, coordination and gait not tested.    ASSESSMENT/PLAN  Kenneth [name] is a 73 y.o. male with history of prior history of cryptogenic strokes, diabetes, hyperlipidemia, hypertension, iron deficiency anemia, OSA, prostate cancer admitted for gradually progressive weakness and poor nutrition intake.    Stroke:  b/l small bilateral multifocal embolic infarcts, etiology: possible from primary hypercoagulable state CT head No acute abnormality.  MRI  Many small acute infarcts in the bilateral frontal and parietal lobes, left thalamus, right occipital lobe, and cerebellum MRA iron Cabbell MRI repeat new small left pontine acute infarct.  Possible small left frontal cortex new infarct.  Stable subacute infarcts seen on the last MRI 2D Echo EF 60-65%.  LA size normal. CT chest abdomen pelvis no evidence of malignancy  Loop recorder interrogation negative for paroxysmal A-fib  LDL 47 HgbA1c 5.3 EEG diffuse slowing.  No seizure activity VTE prophylaxis - heparin subq aspirin 81 mg daily and Brilinta (ticagrelor) 90 mg bid prior to admission, now on heparin IV. Off ASA due to worsening thrombocytopenia Therapy recommendations: SNF Disposition: Pending, palliative care on board  Respiratory failure Aspiration Sepsis / septic shock Intubated for airway protection CCM on board Vent management per CCM On Zosyn and vancomycin Leukocytosis WBC 15.0->16.6->16.5 LA 4.8->6.0->6.6->8.0 HIDA scan pending to rule out cholecystitis   Hx of Stroke/TIA 09/2022 admitted for bilateral ACA infarct, left more than right. CTA head and neck showed right A3 stenosis. EF 55 to 60%, no DVT. LDL 45, A1c 5.6. Discharged on DAPT and Lipitor  40 and Zetia. Loop recorder placed.  03/2023 admitted for small acute to subacute infarct on the left frontal and left parietal lobes, and right temporal white matter.  CTA head and neck similar diminutive distal right ACA.  EF 60 to 65%.  Loop recorder no A-fib.  LDL 47, A1c 8.2.  Discharged on aspirin and Brilinta for 30 days and then Plavix  alone.  Also discharged on Lipitor and Zetia.  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 Dr. Candise Che saw pt again on 3/28 and again recommended anticoagulation Discussed with wife, now on heparin IV   PAF Overnight Afib RVR Put on amiodarone Now converted back to NSR Continue heparin IV Cardiology on board  Cervical myelopathy  MRI C-spine  Severe canal stenosis at C3-C4. Cord T2 hyperintensity superior to this level, suspicious for edema and compressive myelopathy. Severe left and moderate right foraminal stenosis at this level. MRI T-spine Edema involving the left T1 inferior articular facet Neurosurgery consulted, recommend cervical surgery once medically stable.   Off aspirin 81 due to continued thrombocytopenia, has been off brilinta  Hx of hypertension Now hypotension with septic shock Home meds: Lotensin 40 mg Hygroton 25 mg Stable on 3 pressors with Levophed, neo and vasopressin Long-term BP goal normotensive  Hyperlipidemia Home meds: Atorvastatin 40 mg and Zetia 10 mg LDL 47, goal < 70 Statin and zetia are on hold given acute liver injury Continue statin once LFTs normalize  Diabetes type II Controlled Home meds: Metformin, Toujeo HgbA1c 5.3, goal < 7.0 CBGs SSI Recommend close follow-up with PCP for better DM control  Acute liver injury AST/ALT 515/469--376/361--172/240--110/138--141/104 INR 1.4--1.3--1.4--1.4 Close monitoring Management per CCM  Anemia and thrombocytopenia Hemoglobin 8.9--8.5--8.1--7.8--8.4--7.5--6.5--PRBC--7.5 Platelet 97--91--89--71--64--57 Continue CBC monitoring closely  Other Stroke Risk Factors Obstructive sleep apnea, not on CPAP at home Advanced age  Other Active Problems BPH Depression, psychiatry on board B12 deficiency Cognitive  impairment   Hospital day # 10  This patient is critically ill due to embolic stroke, hypercoagulable state, respiratory failure, and at significant risk of neurological worsening, death form further stroke, hemorrhagic transformation, PE/DVT. This patient's care requires constant monitoring of vital signs, hemodynamics, respiratory and cardiac monitoring, review of multiple databases, neurological assessment, discussion with family, other specialists and medical decision making of high complexity. I spent 40 minutes of neurocritical care time in the care of this patient.  I had long discussion with wife and sister at bedside, updated pt current condition, treatment plan and potential prognosis, and answered all the questions.  They expressed understanding and appreciation. I also discussed with Dr. Chestine Spore CCM.  Marvel Plan, MD PhD Stroke Neurology 08/11/2023 3:40 PM    To contact Stroke Continuity provider, please refer to [email or web address]. After hours, contact General Neurology

## 2023-08-11 NOTE — Progress Notes (Signed)
 Pt. Placed on full vent support prior to transport for HIDA scan.

## 2023-08-11 DEATH — deceased

## 2023-08-12 ENCOUNTER — Inpatient Hospital Stay (HOSPITAL_COMMUNITY)

## 2023-08-12 DIAGNOSIS — D6859 Other primary thrombophilia: Secondary | ICD-10-CM | POA: Diagnosis not present

## 2023-08-12 DIAGNOSIS — Z7189 Other specified counseling: Secondary | ICD-10-CM | POA: Diagnosis not present

## 2023-08-12 DIAGNOSIS — D649 Anemia, unspecified: Secondary | ICD-10-CM | POA: Diagnosis not present

## 2023-08-12 DIAGNOSIS — Z515 Encounter for palliative care: Secondary | ICD-10-CM | POA: Diagnosis not present

## 2023-08-12 DIAGNOSIS — I700 Atherosclerosis of aorta: Secondary | ICD-10-CM | POA: Diagnosis not present

## 2023-08-12 DIAGNOSIS — C61 Malignant neoplasm of prostate: Secondary | ICD-10-CM | POA: Diagnosis not present

## 2023-08-12 DIAGNOSIS — I4891 Unspecified atrial fibrillation: Secondary | ICD-10-CM | POA: Diagnosis not present

## 2023-08-12 DIAGNOSIS — I272 Pulmonary hypertension, unspecified: Secondary | ICD-10-CM | POA: Diagnosis not present

## 2023-08-12 DIAGNOSIS — E1129 Type 2 diabetes mellitus with other diabetic kidney complication: Secondary | ICD-10-CM | POA: Diagnosis not present

## 2023-08-12 DIAGNOSIS — G9340 Encephalopathy, unspecified: Secondary | ICD-10-CM

## 2023-08-12 DIAGNOSIS — E440 Moderate protein-calorie malnutrition: Secondary | ICD-10-CM

## 2023-08-12 DIAGNOSIS — Z794 Long term (current) use of insulin: Secondary | ICD-10-CM | POA: Diagnosis not present

## 2023-08-12 DIAGNOSIS — N179 Acute kidney failure, unspecified: Secondary | ICD-10-CM

## 2023-08-12 DIAGNOSIS — M4802 Spinal stenosis, cervical region: Secondary | ICD-10-CM | POA: Diagnosis not present

## 2023-08-12 DIAGNOSIS — I63443 Cerebral infarction due to embolism of bilateral cerebellar arteries: Secondary | ICD-10-CM | POA: Diagnosis not present

## 2023-08-12 DIAGNOSIS — I872 Venous insufficiency (chronic) (peripheral): Secondary | ICD-10-CM | POA: Diagnosis not present

## 2023-08-12 DIAGNOSIS — D630 Anemia in neoplastic disease: Secondary | ICD-10-CM | POA: Diagnosis not present

## 2023-08-12 DIAGNOSIS — L89322 Pressure ulcer of left buttock, stage 2: Secondary | ICD-10-CM | POA: Diagnosis not present

## 2023-08-12 DIAGNOSIS — A419 Sepsis, unspecified organism: Secondary | ICD-10-CM | POA: Diagnosis not present

## 2023-08-12 DIAGNOSIS — R6521 Severe sepsis with septic shock: Secondary | ICD-10-CM

## 2023-08-12 DIAGNOSIS — I10 Essential (primary) hypertension: Secondary | ICD-10-CM | POA: Diagnosis not present

## 2023-08-12 DIAGNOSIS — E785 Hyperlipidemia, unspecified: Secondary | ICD-10-CM | POA: Diagnosis not present

## 2023-08-12 DIAGNOSIS — Z66 Do not resuscitate: Secondary | ICD-10-CM | POA: Diagnosis not present

## 2023-08-12 DIAGNOSIS — E1151 Type 2 diabetes mellitus with diabetic peripheral angiopathy without gangrene: Secondary | ICD-10-CM | POA: Diagnosis not present

## 2023-08-12 HISTORY — PX: IR PERC CHOLECYSTOSTOMY: IMG2326

## 2023-08-12 LAB — CBC WITH DIFFERENTIAL/PLATELET
Abs Immature Granulocytes: 0 10*3/uL (ref 0.00–0.07)
Basophils Absolute: 0 10*3/uL (ref 0.0–0.1)
Basophils Relative: 0 %
Eosinophils Absolute: 0 10*3/uL (ref 0.0–0.5)
Eosinophils Relative: 0 %
HCT: 22.7 % — ABNORMAL LOW (ref 39.0–52.0)
Hemoglobin: 7.5 g/dL — ABNORMAL LOW (ref 13.0–17.0)
Lymphocytes Relative: 3 %
Lymphs Abs: 0.8 10*3/uL (ref 0.7–4.0)
MCH: 28.8 pg (ref 26.0–34.0)
MCHC: 33 g/dL (ref 30.0–36.0)
MCV: 87.3 fL (ref 80.0–100.0)
Monocytes Absolute: 1 10*3/uL (ref 0.1–1.0)
Monocytes Relative: 4 %
Neutro Abs: 23.9 10*3/uL — ABNORMAL HIGH (ref 1.7–7.7)
Neutrophils Relative %: 93 %
Platelets: 73 10*3/uL — ABNORMAL LOW (ref 150–400)
RBC: 2.6 MIL/uL — ABNORMAL LOW (ref 4.22–5.81)
RDW: 16.5 % — ABNORMAL HIGH (ref 11.5–15.5)
WBC: 25.7 10*3/uL — ABNORMAL HIGH (ref 4.0–10.5)
nRBC: 0 /100{WBCs}
nRBC: 0.1 % (ref 0.0–0.2)

## 2023-08-12 LAB — LACTIC ACID, PLASMA
Lactic Acid, Venous: 8.2 mmol/L (ref 0.5–1.9)
Lactic Acid, Venous: 7.9 mmol/L (ref 0.5–1.9)
Lactic Acid, Venous: 9 mmol/L (ref 0.5–1.9)

## 2023-08-12 LAB — COMPREHENSIVE METABOLIC PANEL WITH GFR
ALT: 77 U/L — ABNORMAL HIGH (ref 0–44)
AST: 134 U/L — ABNORMAL HIGH (ref 15–41)
Albumin: 2.2 g/dL — ABNORMAL LOW (ref 3.5–5.0)
Alkaline Phosphatase: 143 U/L — ABNORMAL HIGH (ref 38–126)
Anion gap: 16 — ABNORMAL HIGH (ref 5–15)
BUN: 68 mg/dL — ABNORMAL HIGH (ref 8–23)
CO2: 16 mmol/L — ABNORMAL LOW (ref 22–32)
Calcium: 7.7 mg/dL — ABNORMAL LOW (ref 8.9–10.3)
Chloride: 105 mmol/L (ref 98–111)
Creatinine, Ser: 1.37 mg/dL — ABNORMAL HIGH (ref 0.61–1.24)
GFR, Estimated: 54 mL/min — ABNORMAL LOW (ref >60)
Glucose, Bld: 170 mg/dL — ABNORMAL HIGH (ref 70–99)
Potassium: 3.8 mmol/L (ref 3.5–5.1)
Sodium: 137 mmol/L (ref 135–145)
Total Bilirubin: 6.2 mg/dL — ABNORMAL HIGH (ref 0.0–1.2)
Total Protein: 3.9 g/dL — ABNORMAL LOW (ref 6.5–8.1)

## 2023-08-12 LAB — TYPE AND SCREEN
ABO/RH(D): AB POS
Antibody Screen: NEGATIVE
Unit division: 0

## 2023-08-12 LAB — CULTURE, BLOOD (ROUTINE X 2)
Culture: NO GROWTH
Special Requests: ADEQUATE

## 2023-08-12 LAB — BPAM RBC
Blood Product Expiration Date: 202504302359
ISSUE DATE / TIME: 202504010335
Unit Type and Rh: 6200

## 2023-08-12 LAB — GLUCOSE, CAPILLARY
Glucose-Capillary: 167 mg/dL — ABNORMAL HIGH (ref 70–99)
Glucose-Capillary: 157 mg/dL — ABNORMAL HIGH (ref 70–99)
Glucose-Capillary: 165 mg/dL — ABNORMAL HIGH (ref 70–99)
Glucose-Capillary: 161 mg/dL — ABNORMAL HIGH (ref 70–99)
Glucose-Capillary: 168 mg/dL — ABNORMAL HIGH (ref 70–99)
Glucose-Capillary: 179 mg/dL — ABNORMAL HIGH (ref 70–99)

## 2023-08-12 LAB — CULTURE, RESPIRATORY W GRAM STAIN

## 2023-08-12 LAB — CBC
HCT: 24.3 % — ABNORMAL LOW (ref 39.0–52.0)
Hemoglobin: 8 g/dL — ABNORMAL LOW (ref 13.0–17.0)
MCH: 28.7 pg (ref 26.0–34.0)
MCHC: 32.9 g/dL (ref 30.0–36.0)
MCV: 87.1 fL (ref 80.0–100.0)
Platelets: 72 10*3/uL — ABNORMAL LOW (ref 150–400)
RBC: 2.79 MIL/uL — ABNORMAL LOW (ref 4.22–5.81)
RDW: 16.4 % — ABNORMAL HIGH (ref 11.5–15.5)
WBC: 27.3 10*3/uL — ABNORMAL HIGH (ref 4.0–10.5)
nRBC: 0.1 % (ref 0.0–0.2)

## 2023-08-12 LAB — HEPARIN LEVEL (UNFRACTIONATED)
Heparin Unfractionated: 0.16 [IU]/mL — ABNORMAL LOW (ref 0.30–0.70)
Heparin Unfractionated: 0.13 [IU]/mL — ABNORMAL LOW (ref 0.30–0.70)

## 2023-08-12 LAB — AMMONIA: Ammonia: 43 umol/L — ABNORMAL HIGH (ref 9–35)

## 2023-08-12 LAB — OCCULT BLOOD X 1 CARD TO LAB, STOOL: Fecal Occult Bld: NEGATIVE

## 2023-08-12 MED ORDER — FUROSEMIDE 10 MG/ML IJ SOLN
40.0000 mg | Freq: Once | INTRAMUSCULAR | Status: AC
  Administered 2023-08-12: 40 mg via INTRAVENOUS
  Filled 2023-08-12: qty 4

## 2023-08-12 MED ORDER — INSULIN GLARGINE-YFGN 100 UNIT/ML ~~LOC~~ SOLN
35.0000 [IU] | Freq: Every day | SUBCUTANEOUS | Status: DC
  Administered 2023-08-12 – 2023-08-13 (×2): 35 [IU] via SUBCUTANEOUS
  Filled 2023-08-12 (×2): qty 0.35

## 2023-08-12 MED ORDER — MIDAZOLAM HCL 2 MG/2ML IJ SOLN
INTRAMUSCULAR | Status: AC
  Filled 2023-08-12: qty 2

## 2023-08-12 MED ORDER — AMIODARONE HCL 200 MG PO TABS: 400.0000 mg | ORAL_TABLET | Freq: Every day | ORAL | Status: DC

## 2023-08-12 MED ORDER — AMIODARONE HCL 200 MG PO TABS
400.0000 mg | ORAL_TABLET | Freq: Every day | ORAL | Status: DC
Start: 2023-08-17 — End: 2023-08-12

## 2023-08-12 MED ORDER — MIDAZOLAM HCL 2 MG/2ML IJ SOLN
INTRAMUSCULAR | Status: DC | PRN
  Administered 2023-08-12: 1 mg via INTRAVENOUS

## 2023-08-12 MED ORDER — LIDOCAINE HCL 1 % IJ SOLN
INTRAMUSCULAR | Status: AC
Start: 2023-08-12 — End: ?
  Filled 2023-08-12: qty 20

## 2023-08-12 MED ORDER — AMIODARONE HCL 200 MG PO TABS: 400.0000 mg | ORAL_TABLET | Freq: Two times a day (BID) | ORAL | Status: DC

## 2023-08-12 MED ORDER — IOHEXOL 300 MG/ML  SOLN
50.0000 mL | Freq: Once | INTRAMUSCULAR | Status: AC | PRN
  Administered 2023-08-12: 10 mL

## 2023-08-12 MED ORDER — SODIUM CHLORIDE 0.9% FLUSH
5.0000 mL | Freq: Three times a day (TID) | INTRAVENOUS | Status: DC
Start: 2023-08-12 — End: 2023-08-15
  Administered 2023-08-12 – 2023-08-15 (×9): 5 mL

## 2023-08-12 MED ORDER — AMIODARONE HCL 200 MG PO TABS: 400.0000 mg | ORAL_TABLET | Freq: Once | ORAL | Status: DC

## 2023-08-12 MED ORDER — AMIODARONE HCL 200 MG PO TABS
400.0000 mg | ORAL_TABLET | Freq: Two times a day (BID) | ORAL | Status: DC
  Administered 2023-08-12 – 2023-08-13 (×2): 400 mg
  Filled 2023-08-12 (×2): qty 2

## 2023-08-12 MED ORDER — LIDOCAINE HCL 1 % IJ SOLN
20.0000 mL | Freq: Once | INTRAMUSCULAR | Status: AC
  Administered 2023-08-12: 10 mL via INTRADERMAL
  Filled 2023-08-12: qty 20

## 2023-08-12 MED ORDER — NOREPINEPHRINE 16 MG/250ML-% IV SOLN
0.0000 ug/min | INTRAVENOUS | Status: DC
  Administered 2023-08-12: 4 ug/min via INTRAVENOUS
  Administered 2023-08-13: 6 ug/min via INTRAVENOUS
  Administered 2023-08-15: 40 ug/min via INTRAVENOUS
  Filled 2023-08-12 (×3): qty 250

## 2023-08-12 MED ORDER — HEPARIN (PORCINE) 25000 UT/250ML-% IV SOLN
2400.0000 [IU]/h | INTRAVENOUS | Status: DC
  Administered 2023-08-12 (×2): 1900 [IU]/h via INTRAVENOUS
  Administered 2023-08-13: 2350 [IU]/h via INTRAVENOUS
  Administered 2023-08-13: 2400 [IU]/h via INTRAVENOUS
  Filled 2023-08-12 (×4): qty 250

## 2023-08-12 MED ORDER — PHENYLEPHRINE CONCENTRATED 100MG/250ML (0.4 MG/ML) INFUSION SIMPLE: 0.0000 ug/min | INTRAVENOUS | Status: DC

## 2023-08-12 NOTE — Progress Notes (Addendum)
 PHARMACY - ANTICOAGULATION CONSULT NOTE  Pharmacy Consult for heparin Indication: stroke  Patient Measurements: Height: 5' 10.98" (180.3 cm) Weight: 103 kg (227 lb 1.2 oz) IBW/kg (Calculated) : 75.26 HEPARIN DW (KG): 86.6  Vital Signs: Temp: 98.3 F (36.8 C) (04/02 0400) Temp Source: Axillary (04/02 0400) BP: 157/64 (04/02 0900) Pulse Rate: 61 (04/02 0900)  Labs: Recent Labs    08/10/23 0521 08/10/23 1214 08/10/23 1620 08/10/23 2209 08/11/23 0037 08/11/23 0453 08/11/23 0807 08/11/23 1400 08/11/23 2304 08/12/23 0500 08/12/23 0757  HGB  --    < > 7.6*   < > 7.0*  --  7.5*  --  8.0* 7.5*  --   HCT  --    < > 23.8*   < > 21.6*  --  22.0*  --  24.3* 22.7*  --   PLT  --    < > 67*  --  57*  --   --   --  72* 73*  --   LABPROT 17.6*  --   --   --  17.5*  --   --   --   --   --   --   INR 1.4*  --   --   --  1.4*  --   --   --   --   --   --   HEPARINUNFRC  --    < >  --    < >  --  0.21*  --  0.19* 0.17*  --  0.16*  CREATININE 1.40*  --  1.30*  --  1.33*  --   --   --   --  1.37*  --   TROPONINIHS  --   --   --   --   --  39*  --   --   --   --   --    < > = values in this interval not displayed.    Estimated Creatinine Clearance: 57.8 mL/min (A) (by C-G formula based on SCr of 1.37 mg/dL (H)).  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   Was admitted 3/22 for progressive weakness. 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 Pharmacy consulted to dose heparin 3/30.  Heparin level remains subtherapeutic despite rate increases, cautious titration without boluses d/t risk   Goal of  Therapy:  Heparin level 0.3-0.5 units/ml Monitor platelets by anticoagulation protocol: Yes   Plan:  Increase heparin gtt to 1900 units/hr F/u 8 hour heparin level  Addendum: Stools concerning for GIB, hold heparin gtt for now per discussion with CCM and f/u fecal occult   - Occult negative, ok to resume after IR procedure per CCM/IR  Daylene Posey, PharmD, [facility] Clinical Pharmacist ED Pharmacist Phone # [phone] 08/12/2023 9:09 AM

## 2023-08-12 NOTE — Progress Notes (Signed)
 STROKE TEAM PROGRESS NOTE   INTERIM HISTORY/SUBJECTIVE Wife, sister, brother, nephew are at the bedside. Dr. Chestine Spore also present. Pt barely open eyes on voice, with forced eye opening, he seems able to have some lateral gaze to voice. Did not follow any commands.    OBJECTIVE  CBC    Component Value Date/Time   WBC 25.7 (H) 08/12/2023 0500   RBC 2.60 (L) 08/12/2023 0500   HGB 7.5 (L) 08/12/2023 0500   HGB 12.3 (L) 05/01/2023 1204   HCT 22.7 (L) 08/12/2023 0500   PLT 73 (L) 08/12/2023 0500   PLT 249 05/01/2023 1204   MCV 87.3 08/12/2023 0500   MCH 28.8 08/12/2023 0500   MCHC 33.0 08/12/2023 0500   RDW 16.5 (H) 08/12/2023 0500   LYMPHSABS 0.8 08/12/2023 0500   MONOABS 1.0 08/12/2023 0500   EOSABS 0.0 08/12/2023 0500   BASOSABS 0.0 08/12/2023 0500    BMET    Component Value Date/Time   NA 137 08/12/2023 0500   NA 142 04/06/2017 1057   K 3.8 08/12/2023 0500   CL 105 08/12/2023 0500   CO2 16 (L) 08/12/2023 0500   GLUCOSE 170 (H) 08/12/2023 0500   BUN 68 (H) 08/12/2023 0500   BUN 13 04/06/2017 1057   CREATININE 1.37 (H) 08/12/2023 0500   CREATININE 0.94 05/01/2023 1204   CALCIUM 7.7 (L) 08/12/2023 0500   GFRNONAA 54 (L) 08/12/2023 0500   GFRNONAA >60 05/01/2023 1204    IMAGING past 24 hours CT HEAD WO CONTRAST ( ) Result Date: 08/11/2023 CLINICAL DATA:  Encephalopathy EXAM: CT HEAD WITHOUT CONTRAST TECHNIQUE: Contiguous axial images were obtained from the base of the skull through the vertex without intravenous contrast. RADIATION DOSE REDUCTION: This exam was performed according to the departmental dose-optimization program which includes automated exposure control, adjustment of the mA and/or kV according to patient size and/or use of iterative reconstruction technique. COMPARISON:  08/04/2023 FINDINGS: Brain: Diffuse cerebral atrophy. No acute intracranial abnormality. Specifically, no hemorrhage, hydrocephalus, mass lesion, acute infarction, or significant intracranial  injury. Vascular: No hyperdense vessel or unexpected calcification. Skull: No acute calvarial abnormality. Sinuses/Orbits: No acute findings Other: None IMPRESSION: Atrophy.  No acute intracranial abnormality. Electronically Signed   By: Charlett Nose M.D.   On: 08/11/2023 19:31   CT ABDOMEN PELVIS W CONTRAST Result Date: 08/11/2023 CLINICAL DATA:  Sepsis EXAM: CT ABDOMEN AND PELVIS WITH CONTRAST TECHNIQUE: Multidetector CT imaging of the abdomen and pelvis was performed using the standard protocol following bolus administration of intravenous contrast. RADIATION DOSE REDUCTION: This exam was performed according to the departmental dose-optimization program which includes automated exposure control, adjustment of the mA and/or kV according to patient size and/or use of iterative reconstruction technique. CONTRAST:  75mL OMNIPAQUE IOHEXOL 350 MG/ML SOLN COMPARISON:  08/07/2023 FINDINGS: Lower chest: See chest CT report from today. Hepatobiliary: Small layering stones within the gallbladder. No focal hepatic abnormality or biliary ductal dilatation. Pancreas: No focal abnormality or ductal dilatation. Spleen: 3.1 cm low-density lesion in the superior aspect of the spleen, unchanged. Normal size. Adrenals/Urinary Tract: Bilateral adrenal nodules again noted, unchanged. Mild left hydronephrosis is unchanged. Left ureter is decompressed. Findings compatible with chronic UPJ obstruction. No hydronephrosis on the right. Right upper pole renal cyst is stable. No follow-up imaging recommended. Urinary bladder decompressed with Foley catheter in place. Stomach/Bowel: Normal appendix. Scattered colonic diverticulosis. No active diverticulitis. Stomach and small bowel decompressed. NG tube tip is in the proximal duodenum. Vascular/Lymphatic: Aortic atherosclerosis. No evidence of aneurysm or adenopathy. Reproductive:  Mild prostate hypertrophy. Surgical clips or radiation seeds adjacent to the prostate. Other: Small amount of  free fluid in the pelvis. Diffuse edema throughout the subcutaneous soft tissues compatible with anasarca, worsening since prior study. Bilateral inguinal hernias containing fat. Musculoskeletal: No acute bony abnormality. IMPRESSION: No acute findings in the abdomen or pelvis. Stable chronic UPJ obstruction. Cholelithiasis. Stable low-density lesion in the superior aspect of the spleen with differential remaining the same. This could be further evaluated with non emergent MRI. Small amount of free fluid in the pelvis. Aortoiliac atherosclerosis. Diffuse anasarca throughout the subcutaneous soft tissues. Electronically Signed   By: Charlett Nose M.D.   On: 08/11/2023 19:30   CT Angio Chest Pulmonary Embolism (PE) W or WO Contrast Result Date: 08/11/2023 CLINICAL DATA:  Pulmonary embolism (PE) suspected, low to intermediate prob, positive D-dimer. Hypoxia EXAM: CT ANGIOGRAPHY CHEST WITH CONTRAST TECHNIQUE: Multidetector CT imaging of the chest was performed using the standard protocol during bolus administration of intravenous contrast. Multiplanar CT image reconstructions and MIPs were obtained to evaluate the vascular anatomy. RADIATION DOSE REDUCTION: This exam was performed according to the departmental dose-optimization program which includes automated exposure control, adjustment of the mA and/or kV according to patient size and/or use of iterative reconstruction technique. CONTRAST:  75mL OMNIPAQUE IOHEXOL 350 MG/ML SOLN COMPARISON:  08/06/2023 FINDINGS: Cardiovascular: No filling defects in the pulmonary arteries to suggest pulmonary emboli. Heart is normal size. Aorta is normal caliber. Scattered coronary artery and aortic atherosclerosis. Mediastinum/Nodes: No mediastinal, hilar, or axillary adenopathy. Trachea and esophagus are unremarkable. Thyroid unremarkable. Endotracheal tube tip in the midtrachea. Lungs/Pleura: Small right pleural effusion with pleural thickening. Small left pleural effusion, new  since prior study. Dependent atelectasis in the lower lobes. Scarring or atelectasis in the right middle lobe. Upper Abdomen: No acute findings. Musculoskeletal: Chest wall soft tissues are unremarkable. No acute bony abnormality. Review of the MIP images confirms the above findings. IMPRESSION: No evidence of pulmonary embolus. Small bilateral pleural effusions, new on the left since prior study. Associated right pleural thickening. Bibasilar atelectasis. Right middle lobe atelectasis or scarring, similar to prior study. Coronary artery disease. Aortic Atherosclerosis (ICD10-I70.0). Electronically Signed   By: Charlett Nose M.D.   On: 08/11/2023 19:24   NM Hepatobiliary Liver Func Result Date: 08/11/2023 CLINICAL DATA:  Elevated bilirubin. Concern for biliary obstruction. EXAM: NUCLEAR MEDICINE HEPATOBILIARY IMAGING TECHNIQUE: Sequential images of the abdomen were obtained out to 60 minutes following intravenous administration of radiopharmaceutical. RADIOPHARMACEUTICALS:  5.1 mCi Tc-21m  Choletec IV COMPARISON:  Ultrasound 08/10/2023, CT 08/07/2023 FINDINGS: Delayed clearance of radiotracer from blood pool related to hyperbilirubinemia. Uniform uptake within the liver. No counts are excreted into the extrahepatic bile ducts over the course of 2 hour imaging. IMPRESSION: No radiotracer excreted into the bile ducts over 2 hours of imaging. Differential includes biliary obstruction versus hepatocellular disease such as hepatitis. Recommend clinical correlation. Electronically Signed   By: Genevive Bi M.D.   On: 08/11/2023 16:45     Vitals:   08/12/23 0820 08/12/23 0900 08/12/23 1000 08/12/23 1100  BP: (!) 134/44 (!) 157/64 (!) 157/63 (!) 158/62  Pulse: 61 61 (!) 57 (!) 55  Resp: 18 (!) 54 20 (!) 35  Temp:      TempSrc:      SpO2: 100% 99% 99% 99%  Weight:      Height:         PHYSICAL EXAM  Temp:  [97.7 F (36.5 C)-98.7 F (37.1 C)] 98.7 F (37.1 C) (  04/02 0800) Pulse Rate:  [48-65] 55  (04/02 1100) Resp:  [12-61] 35 (04/02 1100) BP: (114-172)/(37-66) 158/62 (04/02 1100) SpO2:  [96 %-100 %] 99 % (04/02 1100) Arterial Line BP: (111-154)/(33-54) 132/43 (04/02 1100) FiO2 (%):  [40 %-100 %] 40 % (04/02 0820)  General - Well nourished, well developed,  off sedation.  Ophthalmologic - fundi not visualized due to noncooperation.  Cardiovascular - Regular rate and rhythm.  Neuro - intubated off sedation, eyes closed, not open on voice and not following commands. With eye opening, eyes in mid position but able to have lateral gaze towards sound intermittently, not blinking to visual threat, pupils equal size light reflexes sluggish. Corneal reflex weakly present, gag and cough present. Breathing over the vent.  Facial symmetry not able to test due to ET tube.  Tongue protrusion not cooperative. On pain stimulation, no movement in all extremities. Sensation, coordination and gait not tested.    ASSESSMENT/PLAN  [name] is a 73 y.o. male with history of prior history of cryptogenic strokes, diabetes, hyperlipidemia, hypertension, iron deficiency anemia, OSA, prostate cancer admitted for gradually progressive weakness and poor nutrition intake.    Stroke:  b/l small bilateral multifocal embolic infarcts, etiology: possible from primary hypercoagulable state CT head No acute abnormality.  MRI  Many small acute infarcts in the bilateral frontal and parietal lobes, left thalamus, right occipital lobe, and cerebellum MRA iron Cabbell MRI repeat new small left pontine acute infarct.  Possible small left frontal cortex new infarct.  Stable subacute infarcts seen on the last MRI 2D Echo EF 60-65%.  LA size normal. CT chest abdomen pelvis no evidence of malignancy  Loop recorder interrogation negative for paroxysmal A-fib  LDL 47 HgbA1c 5.3 EEG diffuse slowing.  No seizure activity VTE prophylaxis - heparin subq aspirin 81 mg daily and Brilinta (ticagrelor) 90 mg bid prior  to admission, now heparin IV on hold for procedure. Off ASA due to thrombocytopenia Therapy recommendations: SNF Disposition: Pending, palliative care on board  Respiratory failure Aspiration Sepsis / septic shock Intubated for airway protection CCM on board Vent management per CCM On Zosyn and vancomycin Leukocytosis WBC 15.0->16.6->16.5->25.7 LA 4.8->6.0->6.6->8.0->8.2 Plan for percutaneous cholecystectomy with drainage  Hx of Stroke/TIA 09/2022 admitted for bilateral ACA infarct, left more than right. CTA head and neck showed right A3 stenosis. EF 55 to 60%, no DVT. LDL 45, A1c 5.6. Discharged on DAPT and Lipitor 40 and Zetia. Loop recorder placed.  03/2023 admitted for small acute to subacute infarct on the left frontal and left parietal lobes, and right temporal white matter.  CTA head and neck similar diminutive distal right ACA.  EF 60 to 65%.  Loop recorder no A-fib.  LDL 47, A1c 8.2.  Discharged on aspirin and Brilinta for 30 days and then Plavix alone.  Also discharged on Lipitor and Zetia.  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 Dr. Candise Che saw pt again on 3/28 and again recommended anticoagulation Now heparin IV on hold for procedure  PAF Overnight Afib RVR On amiodarone IV Now converted back to NSR Was on heparin IV, now on hold for procedure Cardiology on board  Cervical myelopathy  MRI C-spine  Severe canal stenosis at C3-C4. Cord T2 hyperintensity superior to this level, suspicious for edema and compressive myelopathy. Severe left and moderate right foraminal stenosis at this  level. MRI T-spine Edema involving the left T1 inferior articular facet Neurosurgery consulted, recommend cervical surgery once medically stable.   Off aspirin 81 due to continued thrombocytopenia, has been off  brilinta  Hx of hypertension Now hypotension with septic shock Home meds: Lotensin 40 mg Hygroton 25 mg Stable on 3 pressors with Levophed, neo and vasopressin Long-term BP goal normotensive  Hyperlipidemia Home meds: Atorvastatin 40 mg and Zetia 10 mg LDL 47, goal < 70 Statin and zetia are on hold given acute liver injury Continue statin once LFTs normalize  Diabetes type II Controlled Home meds: Metformin, Toujeo HgbA1c 5.3, goal < 7.0 CBGs SSI Recommend close follow-up with PCP for better DM control  Acute liver injury AST/ALT 515/469--376/361--172/240--110/138--141/104--134/77 INR 1.4--1.3--1.4--1.4 Close monitoring Management per CCM  Anemia and thrombocytopenia Hemoglobin 8.9--8.5--8.1--7.8--8.4--7.5--6.5--PRBC--7.5 Platelet 97--91--89--71--64--57->73 Continue CBC monitoring closely  ? UGIB Pt has dark stool Stool guaiac pending Heparin IV on hold  Other Stroke Risk Factors Obstructive sleep apnea, not on CPAP at home Advanced age  Other Active Problems BPH Depression, psychiatry on board B12 deficiency Cognitive impairment AKI, Cre 1.46--1.33--1.37   Hospital day # 11  This patient is critically ill due to embolic stroke, hypercoagulable state, respiratory failure, and at significant risk of neurological worsening, death form further stroke, hemorrhagic transformation, PE/DVT. This patient's care requires constant monitoring of vital signs, hemodynamics, respiratory and cardiac monitoring, review of multiple databases, neurological assessment, discussion with family, other specialists and medical decision making of high complexity. I spent 45 minutes of neurocritical care time in the care of this patient.  I had long discussion with wife and sister at bedside, updated pt current condition, treatment plan and potential prognosis, and answered all the questions.  They expressed understanding and appreciation. I also discussed with Dr. Chestine Spore CCM.  Marvel Plan,  MD PhD Stroke Neurology 08/12/2023 11:35 AM    To contact Stroke Continuity provider, please refer to [email or web address]. After hours, contact General Neurology

## 2023-08-12 NOTE — TOC Progression Note (Signed)
 Transition of Care [facility]) - Progression Note    Patient Details  Name: Kenneth [name] MRN: [id number] Date of Birth: 03/27/1951  [facility] CM/SW Contact  Mearl Latin, LCSW Phone Number: 08/12/2023, 3:16 PM  Clinical Narrative:    CSW continuing to follow.   Expected Discharge Plan: Skilled Nursing Facility Barriers to Discharge: Continued Medical Work up, SNF Pending bed offer  Expected Discharge Plan and Services In-house Referral: Clinical Social Work   Post Acute Care Choice: Skilled Nursing Facility Living arrangements for the past 2 months: Single Family Home                                       Social Determinants of Health (SDOH) Interventions SDOH Screenings   Food Insecurity: No Food Insecurity (08/01/2023)  Housing: Low Risk  (08/01/2023)  Transportation Needs: No Transportation Needs (08/01/2023)  Utilities: Not At Risk (08/01/2023)  Depression (PHQ2-9): Low Risk  (11/07/2022)  Social Connections: Socially Isolated (08/01/2023)  Tobacco Use: Medium Risk (08/01/2023)    Readmission Risk Interventions     No data to display

## 2023-08-12 NOTE — Progress Notes (Signed)
 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   On ED arrival  pt was afebrile, HR:63, BP: 104/47, RR 23. Labs were notable for WBC 7.9, Hgb: 9.0( baseline around 12), Platelets 87, Na: 132, K: 4.1, CO2: 21, Cr, 0.87( Baseline), Transaminases/ ALK Phos WNL, T.bili elevated at 1.9, Mg: 1.4. Troponin : 5--> 4, BNP: 33. FOBT negative, Iron studies consistent with deficiency, B12 low. TSH/T4 WNL. UA grossly normal. RVP negative. CXR no acute, chronic right base changes.  Admitted to [facility]. GI was consulted for anemia in the setting of DAPT with recommendation for BID PPI and EGD to r/o occult GIB. EGD completed 3/23 with mild gastritis, no evidence of active bleeding. Neurology consulted(3/25) for new onset confusion. CT head completed 3/25 with NAICA, new small chronic cortical/ subcortical infarct R frontal lobe ( new from prior), known small chronic infarcts, L frontal/ L parietal/ R temporal lobes. EEG (3/25) showed mild diffuse encephalopathy, no seizures. F/u MRI brain (3/25) showed many small acute infarcts concerning for embolic etiology. MRI C-Spine with severe canal stenosis concerning for edema and compressive myelopathy, severe left/moderate right foraminal stenosis, edema of left T1 inferior articular facet. MRI T-spine negative. Given patients progressive weakness in the setting of known C-Spine compression, NSG consulted for  evaluation 3/26 with recommendation for eventual cord decompression once optimized from a medical standpoint.Additionally, psychiatry was consulted for recommendations on medication management for depressed mood. Cardiology was consulted 3/27 for preoperative cardiac risk stratification.  On 3/28AM, patient was reportedly hot to touch per RN with increased RR and WOB and Tmax 103F. CXR obtained 3/28 stable . WBC slightly elevated to 10.8 , INR 1.4, K 3.3, CO2 21. PCT 1.35 ( 0.65), LA 2.2 with repeat >9.   PCCM consulted for concern for developing sepsis, acute liver injury.  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 CT Head/MRI Brain obtained. MRI C-spine and T-spine completed. 3/26 - NSGY evaluation for ?decompression surgery.  3/27 - Cards consult for preoperative risk stratification for possible cord decompression  surgery, tentatively 3/31. 3/28 - Early AM, febrile to Tmax 103F with increased RR/WOB and ?aspiration; LA 2.2 with mild WBC elevation to 10.8; CXR stable. LA > 9 prompted PCCM consult (aberrant value, repeat 2.1). LFTs elevated. Worsening mental status/drowsiness. 3/29  Worsening mental status. Intubated for airway protection. MRI with new 5 mm acute infarct in left pons and numerous acute/subacute infarcts without interval progression 4/1: SVT overnight received adenosine. Followed by aflutter received amio bolus and gtt. In sinus in AM. Ongoing profound septic shock on 3 vasopressors, bicarb gtt. Unclear source but high suspicion for biliary - going for HIDA scan  4/2: IR consulted after HIDA and deferred intervention. He was re-pan scanned with no other obvious source of infection. In AM  with ongoing lactic of 8, shock, and persistently elevating LFTs. IR re-consulted and will evaluate patient for perc drain.   Interim History / Subjective:  Opens eyes to verbal stimulus. Otherwise does not follow commands. Query his anasarca for not being able to move much. Continues on 3 pressors but will attempt to titrate down. IR To evaluate this morning for possible perc drain. His sputum did have staph epi like previous blood culture but should be treated at this point.   Objective:  Blood pressure (!) 147/62, pulse 61, temperature 98.3 F (36.8 C), temperature source Axillary, resp. rate (!) 44, height 5' 10.98" (1.803 m), weight 103 kg, SpO2 100%.    Vent Mode: PRVC FiO2 (%):  [40 %-100 %] 40 % Set Rate:  [18 bmp] 18 bmp Vt Set:  [600 mL] 600 mL PEEP:  [5 cmH20] 5 cmH20 Pressure Support:  [10 cmH20] 10 cmH20 Plateau Pressure:  [16 cmH20-18 cmH20] 18 cmH20   Intake/Output Summary (Last 24 hours) at 08/12/2023 0751 Last data filed at 08/12/2023 0600 Gross per 24 hour  Intake 3656.01 ml  Output 1210 ml  Net 2446.01 ml   Filed Weights   08/01/23 0628 08/10/23 0500 08/11/23 0444  Weight: 86.6 kg 102.2 kg 103 kg   Physical Exam: General: elderly male, critically ill appearing, laying in bed, no acute distress  HENT: /AT, eyes anicteric, pupils equal but sluggish, ett  Respiratory: vented, minimal settings, clear bilaterally, diminished bases, no wheezing,  rhonchi, rales  Cardiovascular: s1s2, bradycardia, no murmur, rub, gallop GI: rounded, soft, non distended, +BS Extremities: anasarca  Neuro: opens eyes to verbal command, does not move extremities to command, +gag/cough  Resolved Hospital Problem List:   Assessment & Plan:  Cryptogenic CVAs, 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 TCD bubble study 2/15 with "small, clinically insignificant PFO". 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 Failed Plavix and Brilinta - stroke team consulted, appreciate management  - IV heparin for now  - no aspirin per neuro due to worsening thrombocytopenia  - con't hold statin, zetia with liver injury  - serial neuro checks - neuroprotective measures - mirtazapine stopped with encephalopathy - anticipate SNF  - not appropriate for PT/OT at this time  Acute hypoxemic respiratory failure secondary to aspiration in setting of recurrent strokes Tracheal aspirate with staph epi growing. Was also on initial blood culture 1/4 bottles.  - on vanc/zosyn which coverage  - full mechanical vent support - lung protective ventilation 6-8cc/kg Vt - VAP and PAD bundle in place  - titrate FiO2  to sat goal >92  - maintain peak/plats <30, driving pressures <40    Septic shock, unknown etiology, suspect biliary  Lactic Acidosis  Initial 1/4 bottles blood cultures +MRSE, repeat negative. Sputum with MRSE. Increasing lactic acidosis up to 8. No obvious pneumonia. Has had repeat CT abdominal scans negative for acute findings. Urine x2 negative. Has sacral wound but doubt this as etiology. 3/31 with RUQ US showing borderline wall thickening, stones and sludge without ductal dilation. 4/1 HIDA with differential of obstruction. IR  consulted and deferred. 4/2 Ongoing LFT elevations/bili elevation with persistent shock state. IR reconsulted 4/2  - IR to eval - appreciate recommendations  - con't vancomycin, zosyn for now  - follow culture data  - con't vaso, levo, neo for MAP > 65  - continue hydrocortisone 100mg  q12h  - con't bicarb gtt  - completed albumin course - consider repeat if drops back off  - consider TEE if IR defers again  - lines were placed after sepsis presentation so doubt line infection - trend lactic, fever, wbc curve   Elevated LFTs  Hyperbilirubinemia-- worsening Differential includes embolic concern (PVT versus Budd-Chiari, given history of clotting disorder), DILI, acute hepatitis, sepsis, cholecystitis, cholangitis. RUQ as above. Discussed with Dr. Miles Costain with IR 4/1 AM who deferred empiric bili drain prior to HIDA scan. LFTs do not look obstructive with alk phos not significantly elevated. HIDA with differential including obstruction. IR deferred. 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 MRI T-spine negative. - NSGY consulted- tentative plan for cord decompression, waiting on improvement in overall medical status. Anticipate it will be at least a few more days with ongoing metabolic disarray.  -PT, OT when stable  Anemia, multifactorial in the setting of critical illness, B12  deficiency, iron deficiency -transfuse for Hb <7 or hemodynamically significant bleeding -iron, thiamine, folace, B12 supplements  Diffuse anasarca  - consider trial of lasix, caution given pressor requirements   T2DM - SSI PRN - goal BG 140-180 - increase glargine to 35U BID with steroids   History of diverticulosis - Noted history  Hard of hearing, congenital -hearing aids available   GOC: palliative spoke with wife 4/1. Plan for meeting 4/2 @ 11A to help with expectations and plan going forward. Clinically, patient would not benefit from further resuscitation such as CPR if he were to arrest. Will discuss this with palliative and wife today and continue to re-evaluate   Best Practice: (right click and "Reselect all SmartList Selections" daily)   Diet/type: tubefeeds and NPO DVT prophylaxis: systemic heparin GI prophylaxis: PPI Lines: PICC Foley:  yes Code Status:  full code Last date of multidisciplinary goals of care discussion: see GOC above  Critical care time:  35  Cristopher Peru, PA-C [facility] 08/12/23 7:51 AM  Please see [email or web address] for pager details.  From 7A-7P if no response, please call [phone] After hours, please call ELink [phone]

## 2023-08-12 NOTE — Progress Notes (Signed)
 Palliative Medicine Inpatient Follow Up Note HPI: Kenneth [name] is a 73 y.o. male with a hx of recurrent CVAs, HTN, HLD, IDDMII, hypercoagulable state (heterozygous for Factor V Leiden, Prothrombin Gene Mutation and Protein S deficiency), cervical spinal stenosis, IDA, prostate CA, OSA, BPH. Came from home for generalized weakness and was identified to be having recurrent strokes.   Today's Discussion 08/12/2023  *Please note that this is a verbal dictation therefore any spelling or grammatical errors are due to the "[employer]agon [employer] One" system interpretation.  Chart reviewed inclusive of vital signs, progress notes, laboratory results, and diagnostic images.   A family meeting was held this morning with patients wife, Kenneth [name], sister, brother in law, and nephew. Both Mertha Baars, CCM APP and myself were present. We discussed Kenneth [name]'s presently clinical illness(s). We reviewed the severity of his sepsis and how he required three pressors and now is on two to maintain his blood pressures. We discussed that he is on broad spectrum antibiotics to aid in fighting the infection. We talked about the plan for an IR drain into his gallbladder to drain sludge. We discussed the long term global picture associate with an acute hospitalization such as this.  We discussed patients recurrent strokes despite AC. We talked about this being an important point and one which does weigh in to his long term clinical outcomes.   We talked about patients wife's wishes in the setting of his disease burden. She shares the hope for him to "pull through this". She notes his strength and resiliency.   Discussions held regarding resuscitation status. We reviewed the burden and families description of the barbaric act of chest compressions. After great family deliberation it was determined to not pursue chest compression though to continue will all other interventions for betterment.   Best case and worst case scenarios  reviewed. Best case being sepsis is controlled, pressors stopped once stable, and [place] can wean of vent support. Ideally per his wife the idea of him living a functional life again.   We discussed worst case scenarios being that Kenneth [name] continues to worsen to a point whereby we need to further discuss end of life. I did review with Kenneth [name] what that would look like in the hospital setting inclusive of liberation from all incumbering supports such as the ventilator, monitor, and supportive medications. We discussed if it evolves into this then the focus is on comfort and symptom relief.   Created space and opportunity for Kenneth [name] to explore thoughts feelings and fears regarding her spouses current medical situation. She shares that she had to liberate her best friend from supportive measures. She is hopeful she does not have to make this decision for Kenneth [name]. Offered her time and space to share some fond memories of their travels.   We reviewed th plan for ongoing support throughout [facility] hospitalization.   Questions and concerns addressed.  Objective Assessment: Vital Signs Vitals:   08/12/23 1315 08/12/23 1320  BP: (!) 150/70 (!) 143/80  Pulse: (!) 56 (!) 55  Resp: (!) 22 (!) 22  Temp:    SpO2: 99% 100%    Intake/Output Summary (Last 24 hours) at 08/12/2023 1335 Last data filed at 08/12/2023 1200 Gross per 24 hour  Intake 4128.62 ml  Output 1035 ml  Net 3093.62 ml   Last Weight  Most recent update: 08/11/2023  4:44 AM    Weight  103 kg (227 lb 1.2 oz)            Gen:  Elderly Caucasian M critically ill HEENT: ETT, NGT,  dry mucous membranes CV: Regular rate and rhythm PULM: On Mechanical ventilator ABD: soft/nontender EXT: No edema  Neuro: Somnolent  SUMMARY OF RECOMMENDATIONS   DNR with intervention(s)  Open and honest conversations held in the setting of patients critical illness   I have called patients [name] as requested by his wife and verified a [name]  will be coming by   Ongoing PMT support  Time Spent: 97 Billing based on MDM: High ______________________________________________________________________________________ Lamarr Lulas [place] Palliative Medicine Team Team Cell Phone: [phone] Please utilize secure chat with additional questions, if there is no response within 30 minutes please call the above phone number  Palliative Medicine Team providers are available by phone from 7am to 7pm daily and can be reached through the team cell phone.  Should this patient require assistance outside of these hours, please call the patient's attending physician.

## 2023-08-12 NOTE — Consult Note (Addendum)
 Referring Physician(s): Jonna Clark., PA-C  Supervising Physician: Gilmer Mor  Patient Status:  [facility] - In-pt  Chief Complaint:  Septic shock; cholecystitis; request for image guided percutaneous cholecystostomy   Subjective:  Inpatient in ICU at time of exam, intubated and sedated. Heparin is paused. No family at bedside but was able to reach via phone for consent.    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 Admitted to [facility] for anemia but devoloped respiratory failure and sepsis during admission requiring admission to ICU, pressors, intubation. Started on amio for narrow complex tachycardia on 4/1. Imaging suggests cholecystitis which may be source of sepsis; IR consulted for chole drain. Dr. Towanda Malkin recommended pan-CT. Reviewed by Dr. Loreta Ave on 4/2, case approved for perc chole drain.   Allergies: Jardiance [empagliflozin], Fluoxetine, Penicillins, and Silodosin  Medications: Prior to Admission medications   Medication Sig Start Date End Date Taking? Authorizing Provider  aspirin 81 MG chewable tablet Chew 1 tablet (81 mg total) by mouth daily. 04/08/23  Yes Marinda Elk, MD  atorvastatin (LIPITOR) 40 MG tablet Take 1 tablet (40 mg total) by mouth daily. Patient taking differently: Take 40 mg by mouth at bedtime. 10/17/22  Yes Love, Evlyn Kanner, PA-C  benazepril (LOTENSIN) 40 MG tablet Take 40 mg by mouth daily.   Yes [provider]  BRILINTA 90 MG TABS tablet Take 90 mg by mouth 2 (two) times daily. 04/24/23  Yes [provider]  chlorthalidone (HYGROTON) 25 MG tablet Take 25 mg by mouth daily.   Yes [provider]  Cholecalciferol (VITAMIN D3) 1000 units CAPS Take  1,000 Units by mouth in the morning and at bedtime.   Yes [provider]  Continuous Glucose Sensor (FREESTYLE LIBRE 14 DAY SENSOR) MISC Inject 1 Device into the skin every 14 (fourteen) days.   Yes [provider]  escitalopram (LEXAPRO) 20 MG tablet Take 20 mg by mouth daily.   Yes [provider]  ezetimibe (ZETIA) 10 MG tablet Take 10 mg by mouth daily. 01/19/20  Yes [provider]  metFORMIN (GLUCOPHAGE-XR) 500 MG 24 hr tablet Take 500 mg by mouth in the morning and at bedtime.   Yes [provider]  tamsulosin (FLOMAX) 0.4 MG CAPS capsule Take 0.4 mg by mouth daily. 07/31/23  Yes [provider]  TOUJEO SOLOSTAR 300 UNIT/ML Solostar Pen Inject 40 Units into the skin in the morning. 04/08/23  Yes Marinda Elk, MD  MOUNJARO 5 MG/0.5ML Pen Inject 5 mg into the skin every Monday. Patient not taking: Reported on 08/01/2023    [provider]   ROS limited by critical care status - intubated.  Vital Signs: BP (!) 157/64   Pulse 61   Temp 98.3 F (36.8 C) (Axillary)   Resp (!) 54   Ht 5' 10.98" (1.803 m)   Wt 227 lb 1.2 oz (103 kg)   SpO2 99%   BMI 31.68 kg/m   Physical Exam Constitutional:      Appearance: He is obese. He is ill-appearing.  HENT:     Mouth/Throat:     Comments: intubated Cardiovascular:     Rate and Rhythm: Normal rate and regular rhythm.     Heart sounds: No murmur  heard.    No friction rub. No gallop.  Pulmonary:     Breath sounds: Normal breath sounds. No wheezing, rhonchi or rales.  Abdominal:     General: There is distension.     Comments: Bowel sounds hypoactive all quadrants  Genitourinary:    Comments: Scrotal edema Musculoskeletal:     Right lower leg: Edema present.     Left lower leg: Edema present.     Comments: PICC to L upper arm present and intact  Skin:    Comments: No rash or wounds to abd  Neurological:     Comments: sedated      Imaging: CT HEAD WO CONTRAST  ( ) Result Date: 08/11/2023 CLINICAL DATA:  Encephalopathy EXAM: CT HEAD WITHOUT CONTRAST TECHNIQUE: Contiguous axial images were obtained from the base of the skull through the vertex without intravenous contrast. RADIATION DOSE REDUCTION: This exam was performed according to the departmental dose-optimization program which includes automated exposure control, adjustment of the mA and/or kV according to patient size and/or use of iterative reconstruction technique. COMPARISON:  08/04/2023 FINDINGS: Brain: Diffuse cerebral atrophy. No acute intracranial abnormality. Specifically, no hemorrhage, hydrocephalus, mass lesion, acute infarction, or significant intracranial injury. Vascular: No hyperdense vessel or unexpected calcification. Skull: No acute calvarial abnormality. Sinuses/Orbits: No acute findings Other: None IMPRESSION: Atrophy.  No acute intracranial abnormality. Electronically Signed   By: Charlett Nose M.D.   On: 08/11/2023 19:31   CT ABDOMEN PELVIS W CONTRAST Result Date: 08/11/2023 CLINICAL DATA:  Sepsis EXAM: CT ABDOMEN AND PELVIS WITH CONTRAST TECHNIQUE: Multidetector CT imaging of the abdomen and pelvis was performed using the standard protocol following bolus administration of intravenous contrast. RADIATION DOSE REDUCTION: This exam was performed according to the departmental dose-optimization program which includes automated exposure control, adjustment of the mA and/or kV according to patient size and/or use of iterative reconstruction technique. CONTRAST:  75mL OMNIPAQUE IOHEXOL 350 MG/ML SOLN COMPARISON:  08/07/2023 FINDINGS: Lower chest: See chest CT report from today. Hepatobiliary: Small layering stones within the gallbladder. No focal hepatic abnormality or biliary ductal dilatation. Pancreas: No focal abnormality or ductal dilatation. Spleen: 3.1 cm low-density lesion in the superior aspect of the spleen, unchanged. Normal size. Adrenals/Urinary Tract: Bilateral adrenal nodules again  noted, unchanged. Mild left hydronephrosis is unchanged. Left ureter is decompressed. Findings compatible with chronic UPJ obstruction. No hydronephrosis on the right. Right upper pole renal cyst is stable. No follow-up imaging recommended. Urinary bladder decompressed with Foley catheter in place. Stomach/Bowel: Normal appendix. Scattered colonic diverticulosis. No active diverticulitis. Stomach and small bowel decompressed. NG tube tip is in the proximal duodenum. Vascular/Lymphatic: Aortic atherosclerosis. No evidence of aneurysm or adenopathy. Reproductive: Mild prostate hypertrophy. Surgical clips or radiation seeds adjacent to the prostate. Other: Small amount of free fluid in the pelvis. Diffuse edema throughout the subcutaneous soft tissues compatible with anasarca, worsening since prior study. Bilateral inguinal hernias containing fat. Musculoskeletal: No acute bony abnormality. IMPRESSION: No acute findings in the abdomen or pelvis. Stable chronic UPJ obstruction. Cholelithiasis. Stable low-density lesion in the superior aspect of the spleen with differential remaining the same. This could be further evaluated with non emergent MRI. Small amount of free fluid in the pelvis. Aortoiliac atherosclerosis. Diffuse anasarca throughout the subcutaneous soft tissues. Electronically Signed   By: Charlett Nose M.D.   On: 08/11/2023 19:30   CT Angio Chest Pulmonary Embolism (PE) W or WO Contrast Result Date: 08/11/2023 CLINICAL DATA:  Pulmonary embolism (PE) suspected, low to intermediate prob, positive D-dimer. Hypoxia  EXAM: CT ANGIOGRAPHY CHEST WITH CONTRAST TECHNIQUE: Multidetector CT imaging of the chest was performed using the standard protocol during bolus administration of intravenous contrast. Multiplanar CT image reconstructions and MIPs were obtained to evaluate the vascular anatomy. RADIATION DOSE REDUCTION: This exam was performed according to the departmental dose-optimization program which includes  automated exposure control, adjustment of the mA and/or kV according to patient size and/or use of iterative reconstruction technique. CONTRAST:  75mL OMNIPAQUE IOHEXOL 350 MG/ML SOLN COMPARISON:  08/06/2023 FINDINGS: Cardiovascular: No filling defects in the pulmonary arteries to suggest pulmonary emboli. Heart is normal size. Aorta is normal caliber. Scattered coronary artery and aortic atherosclerosis. Mediastinum/Nodes: No mediastinal, hilar, or axillary adenopathy. Trachea and esophagus are unremarkable. Thyroid unremarkable. Endotracheal tube tip in the midtrachea. Lungs/Pleura: Small right pleural effusion with pleural thickening. Small left pleural effusion, new since prior study. Dependent atelectasis in the lower lobes. Scarring or atelectasis in the right middle lobe. Upper Abdomen: No acute findings. Musculoskeletal: Chest wall soft tissues are unremarkable. No acute bony abnormality. Review of the MIP images confirms the above findings. IMPRESSION: No evidence of pulmonary embolus. Small bilateral pleural effusions, new on the left since prior study. Associated right pleural thickening. Bibasilar atelectasis. Right middle lobe atelectasis or scarring, similar to prior study. Coronary artery disease. Aortic Atherosclerosis (ICD10-I70.0). Electronically Signed   By: Charlett Nose M.D.   On: 08/11/2023 19:24   NM Hepatobiliary Liver Func Result Date: 08/11/2023 CLINICAL DATA:  Elevated bilirubin. Concern for biliary obstruction. EXAM: NUCLEAR MEDICINE HEPATOBILIARY IMAGING TECHNIQUE: Sequential images of the abdomen were obtained out to 60 minutes following intravenous administration of radiopharmaceutical. RADIOPHARMACEUTICALS:  5.1 mCi Tc-62m  Choletec IV COMPARISON:  Ultrasound 08/10/2023, CT 08/07/2023 FINDINGS: Delayed clearance of radiotracer from blood pool related to hyperbilirubinemia. Uniform uptake within the liver. No counts are excreted into the extrahepatic bile ducts over the course of 2  hour imaging. IMPRESSION: No radiotracer excreted into the bile ducts over 2 hours of imaging. Differential includes biliary obstruction versus hepatocellular disease such as hepatitis. Recommend clinical correlation. Electronically Signed   By: Genevive Bi M.D.   On: 08/11/2023 16:45   ECHOCARDIOGRAM LIMITED Result Date: 08/11/2023    ECHOCARDIOGRAM LIMITED REPORT   Patient Name:   Kenneth [name] Date of Exam: 08/11/2023 Medical Rec #:  [id number]        Height:       71.0 in Accession #:    [id number]       Weight:       227.1 lb Date of Birth:  1951/03/20        BSA:          2.225 m Patient Age:    72 years         BP:           143/54 mmHg Patient Gender: M                HR:           56 bpm. Exam Location:  Inpatient Procedure: Limited Echo, Color Doppler and Cardiac Doppler (Both Spectral and            Color Flow Doppler were utilized during procedure). Indications:    Shock  History:        Patient has prior history of Echocardiogram examinations.                 Stroke, Signs/Symptoms:Syncope; Risk Factors:Diabetes, Former  Smoker and Sleep Apnea.  Sonographer:    Lamont Snowball Referring Phys: [id number] Karl Ito IMPRESSIONS  1. Left ventricular ejection fraction, by estimation, is 55 to 60%. The left ventricle has normal function. The left ventricle has no regional wall motion abnormalities.  2. Mildly D-shaped septum suggests a degree of RV pressure/volume overload. Right ventricular systolic function is mildly reduced. The right ventricular size is mildly enlarged. Tricuspid regurgitation signal is inadequate for assessing PA pressure.  3. The mitral valve is normal in structure. Trivial mitral valve regurgitation. No evidence of mitral stenosis.  4. The aortic valve is tricuspid. There is moderate calcification of the aortic valve. Aortic valve regurgitation is not visualized. Aortic valve sclerosis/calcification is present, without any evidence of aortic stenosis. Aortic  valve mean gradient measures 8.0 mmHg.  5. The inferior vena cava is dilated in size with <50% respiratory variability, suggesting right atrial pressure of 15 mmHg.  6. Limited echo FINDINGS  Left Ventricle: Left ventricular ejection fraction, by estimation, is 55 to 60%. The left ventricle has normal function. The left ventricle has no regional wall motion abnormalities. The left ventricular internal cavity size was normal in size. There is  no left ventricular hypertrophy. Right Ventricle: Mildly D-shaped septum suggests a degree of RV pressure/volume overload. The right ventricular size is mildly enlarged. No increase in right ventricular wall thickness. Right ventricular systolic function is mildly reduced. Tricuspid regurgitation signal is inadequate for assessing PA pressure. Left Atrium: Left atrial size was normal in size. Right Atrium: Right atrial size was normal in size. Pericardium: There is no evidence of pericardial effusion. Mitral Valve: The mitral valve is normal in structure. Mild mitral annular calcification. Trivial mitral valve regurgitation. No evidence of mitral valve stenosis. Tricuspid Valve: The tricuspid valve is normal in structure. Tricuspid valve regurgitation is mild. Aortic Valve: The aortic valve is tricuspid. There is moderate calcification of the aortic valve. Aortic valve regurgitation is not visualized. Aortic valve sclerosis/calcification is present, without any evidence of aortic stenosis. Aortic valve mean gradient measures 8.0 mmHg. Aortic valve peak gradient measures 15.4 mmHg. Aortic valve area, by VTI measures 1.57 cm. Aorta: The aortic root is normal in size and structure. Venous: The inferior vena cava is dilated in size with less than 50% respiratory variability, suggesting right atrial pressure of 15 mmHg. LEFT VENTRICLE PLAX 2D LVIDd:         5.00 cm LVIDs:         3.30 cm LV PW:         0.80 cm LV IVS:        0.80 cm LVOT diam:     1.90 cm LV SV:         66 LV SV  Index:   30 LVOT Area:     2.84 cm  IVC IVC diam: 2.50 cm AORTIC VALVE AV Area (Vmax):    1.50 cm AV Area (Vmean):   1.57 cm AV Area (VTI):     1.57 cm AV Vmax:           196.00 cm/s AV Vmean:          129.000 cm/s AV VTI:            0.420 m AV Peak Grad:      15.4 mmHg AV Mean Grad:      8.0 mmHg LVOT Vmax:         104.00 cm/s LVOT Vmean:        71.600 cm/s LVOT  VTI:          0.232 m LVOT/AV VTI ratio: 0.55  AORTA Ao Root diam: 2.90 cm  SHUNTS Systemic VTI:  0.23 m Systemic Diam: 1.90 cm Dalton McleanMD Electronically signed by Wilfred Lacy Signature Date/Time: 08/11/2023/9:33:29 AM    Final    DG CHEST PORT 1 VIEW Result Date: 08/10/2023 CLINICAL DATA:  Hypoxia EXAM: PORTABLE CHEST 1 VIEW COMPARISON:  X-ray 08/08/2023 and older FINDINGS: Stable ET tube, enteric tube. Placement of a left-sided PICC with tip overlying the central SVC above the right atrium. Presumed loop recorder along the lower left hemithorax. Stable cardiopericardial silhouette. No pneumothorax. Slight increase in small right pleural effusion. Mild adjacent opacity is again noted. No edema. Prominence of the central vasculature. Overlapping cardiac leads. IMPRESSION: New left-sided PICC with tip along the central SVC above the right atrium. Slight increase in tiny right pleural effusion. Electronically Signed   By: Karen Kays M.D.   On: 08/10/2023 16:00   US Abdomen Limited RUQ (LIVER/GB) Result Date: 08/10/2023 CLINICAL DATA:  Hyperbilirubinemia. EXAM: ULTRASOUND ABDOMEN LIMITED RIGHT UPPER QUADRANT COMPARISON:  08/06/2023 FINDINGS: Gallbladder: Dilated gallbladder with some borderline wall thickening of 3 mm. Several internal echoes consistent with sludge. No shadowing stones. Patient is on a ventilator. Common bile duct: Diameter: 6 mm Liver: No focal lesion identified. Within normal limits in parenchymal echogenicity. Portal vein is patent on color Doppler imaging with normal direction of blood flow towards the liver. Other:  Trace ascites. IMPRESSION: Distended gallbladder with borderline wall thickening and sludge. No obvious stones or ductal dilatation. Trace ascites. Electronically Signed   By: Karen Kays M.D.   On: 08/10/2023 15:59   VAS [place] LOWER EXTREMITY VENOUS (DVT) Result Date: 08/09/2023  Lower Venous DVT Study Patient Name:  Kenneth [name]  Date of Exam:   08/09/2023 Medical Rec #: [id number]         Accession #:    [id number] Date of Birth: 05/05/1951         Patient Gender: M Patient Age:   15 years Exam Location:  [facility] Procedure:      VAS [place] LOWER EXTREMITY VENOUS (DVT) Referring Phys: Scheryl Marten XU --------------------------------------------------------------------------------  Indications: Embolic stroke, prostate cancer, weakness. Other Indications: Factor V Leiden and prothrombin gene mutation. Comparison Study: Previous study on 5.30.2024. Performing Technologist: Fernande Bras  Examination Guidelines: A complete evaluation includes B-mode imaging, spectral Doppler, color Doppler, and power Doppler as needed of all accessible portions of each vessel. Bilateral testing is considered an integral part of a complete examination. Limited examinations for reoccurring indications may be performed as noted. The reflux portion of the exam is performed with the patient in reverse Trendelenburg.  +---------+---------------+---------+-----------+----------+-------------------+ RIGHT    CompressibilityPhasicitySpontaneityPropertiesThrombus Aging      +---------+---------------+---------+-----------+----------+-------------------+ CFV      Full           Yes      Yes                                      +---------+---------------+---------+-----------+----------+-------------------+ SFJ      Full           Yes      Yes                                      +---------+---------------+---------+-----------+----------+-------------------+  FV Prox  Full                                                              +---------+---------------+---------+-----------+----------+-------------------+ FV Mid   Full                                                             +---------+---------------+---------+-----------+----------+-------------------+ FV DistalFull                                                             +---------+---------------+---------+-----------+----------+-------------------+ PFV      Full                                                             +---------+---------------+---------+-----------+----------+-------------------+ POP      Full           Yes      Yes                                      +---------+---------------+---------+-----------+----------+-------------------+ PTV                                                   Not well                                                                  visualized, patent                                                        by color.           +---------+---------------+---------+-----------+----------+-------------------+ PERO                                                  Not well  visualized, patent                                                        by color.           +---------+---------------+---------+-----------+----------+-------------------+ Gastroc  Partial        No       No                                       +---------+---------------+---------+-----------+----------+-------------------+   +---------+---------------+---------+-----------+----------+--------------+ LEFT     CompressibilityPhasicitySpontaneityPropertiesThrombus Aging +---------+---------------+---------+-----------+----------+--------------+ CFV      Full           Yes      Yes                                 +---------+---------------+---------+-----------+----------+--------------+  SFJ      Full           Yes      Yes                                 +---------+---------------+---------+-----------+----------+--------------+ FV Prox  Full                                                        +---------+---------------+---------+-----------+----------+--------------+ FV Mid   Full                                                        +---------+---------------+---------+-----------+----------+--------------+ FV DistalFull                                                        +---------+---------------+---------+-----------+----------+--------------+ PFV      Full                                                        +---------+---------------+---------+-----------+----------+--------------+ POP      Full           Yes      Yes                                 +---------+---------------+---------+-----------+----------+--------------+ PTV      Full                                                        +---------+---------------+---------+-----------+----------+--------------+  PERO     Full                                                        +---------+---------------+---------+-----------+----------+--------------+     Summary: RIGHT: 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 - No cystic structure found in the popliteal fossa.  LEFT: - There is no evidence of deep vein thrombosis in the lower extremity.  - No cystic structure found in the popliteal fossa.  *See table(s) above for measurements and observations. Electronically signed by Carolynn Sayers on 08/09/2023 at 1:09:22 PM.    Final    MR BRAIN WO CONTRAST Result Date: 08/08/2023 CLINICAL DATA:  altered mental status, concerned for recurrent strokes; Stroke, follow up. EXAM: MRI HEAD WITHOUT CONTRAST MRA HEAD WITHOUT CONTRAST TECHNIQUE: Multiplanar, multi-echo pulse sequences of the brain and surrounding structures were  acquired without intravenous contrast. Angiographic images of the Circle of Willis were acquired using MRA technique without intravenous contrast. COMPARISON:  Head MRI 08/04/2023.  Head and neck CTA 04/06/2023. FINDINGS: MRI HEAD FINDINGS Brain: A 5 mm acute infarct in the left paracentral pons is new from the prior MRI. Numerous acute to subacute infarcts are again seen involving cortex and white matter in both cerebral hemispheres as well as the dorsal left thalamus and cerebellum. A few of these may be minimally larger or more conspicuous than on the prior MRI, however no sizable new supratentorial infarct is identified. There is up to mild associated cytotoxic edema without mass effect. No intracranial hemorrhage, mass, midline shift, or extra-axial fluid collection is identified. There is mild cerebral atrophy. Vascular: Major intracranial vascular flow voids are preserved. Skull and upper cervical spine: Unremarkable bone marrow signal. Sinuses/Orbits: Unremarkable orbits. No significant inflammatory disease in the paranasal sinuses. Clear mastoid air cells. Other: None. MRA HEAD FINDINGS Anterior circulation: The internal carotid arteries are widely patent from skull base to carotid termini. ACAs and MCAs are patent without evidence of a proximal branch occlusion or significant proximal stenosis. No aneurysm is identified. Posterior circulation: The included portions of the intracranial vertebral arteries are patent to the basilar. Patent PICA and SCA origins are visualized bilaterally. The basilar artery is widely patent. Posterior communicating arteries are diminutive or absent. Both PCAs are patent without evidence of a significant proximal stenosis. No aneurysm is identified. Anatomic variants: Aplastic right A1 segment. IMPRESSION: 1. 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 2. 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 3. Negative head  MRA. Electronically Signed   By: Sebastian Ache M.D.   On: 08/08/2023 16:38   MR ANGIO HEAD WO CONTRAST Result Date: 08/08/2023 CLINICAL DATA:  altered mental status, concerned for recurrent strokes; Stroke, follow up. EXAM: MRI HEAD WITHOUT CONTRAST MRA HEAD WITHOUT CONTRAST TECHNIQUE: Multiplanar, multi-echo pulse sequences of the brain and surrounding structures were acquired without intravenous contrast. Angiographic images of the Circle of Willis were acquired using MRA technique without intravenous contrast. COMPARISON:  Head MRI 08/04/2023.  Head and neck CTA 04/06/2023. FINDINGS: MRI HEAD FINDINGS Brain: A 5 mm acute infarct in the left paracentral pons is new from the prior MRI. Numerous acute to subacute infarcts are again seen involving cortex and white matter in both cerebral hemispheres as well as the dorsal  left thalamus and cerebellum. A few of these may be minimally larger or more conspicuous than on the prior MRI, however no sizable new supratentorial infarct is identified. There is up to mild associated cytotoxic edema without mass effect. No intracranial hemorrhage, mass, midline shift, or extra-axial fluid collection is identified. There is mild cerebral atrophy. Vascular: Major intracranial vascular flow voids are preserved. Skull and upper cervical spine: Unremarkable bone marrow signal. Sinuses/Orbits: Unremarkable orbits. No significant inflammatory disease in the paranasal sinuses. Clear mastoid air cells. Other: None. MRA HEAD FINDINGS Anterior circulation: The internal carotid arteries are widely patent from skull base to carotid termini. ACAs and MCAs are patent without evidence of a proximal branch occlusion or significant proximal stenosis. No aneurysm is identified. Posterior circulation: The included portions of the intracranial vertebral arteries are patent to the basilar. Patent PICA and SCA origins are visualized bilaterally. The basilar artery is widely patent. Posterior  communicating arteries are diminutive or absent. Both PCAs are patent without evidence of a significant proximal stenosis. No aneurysm is identified. Anatomic variants: Aplastic right A1 segment. IMPRESSION: 1. 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 2. 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 3. Negative head MRA. Electronically Signed   By: Sebastian Ache M.D.   On: 08/08/2023 16:38   DG Abd 1 View Result Date: 08/08/2023 CLINICAL DATA:  Orogastric tube placement. EXAM: ABDOMEN - 1 VIEW COMPARISON:  None Available. FINDINGS: The bowel gas pattern is normal. Distal tip of nasogastric tube is seen in expected position of proximal stomach. No radio-opaque calculi or other significant radiographic abnormality are seen. IMPRESSION: Distal tip of nasogastric tube is seen in expected position of proximal stomach. Electronically Signed   By: Lupita Raider M.D.   On: 08/08/2023 11:51   DG Chest Port 1 View Result Date: 08/08/2023 CLINICAL DATA:  Intubation. EXAM: PORTABLE CHEST 1 VIEW COMPARISON:  Same day. FINDINGS: Endotracheal and nasogastric tubes appear to be in grossly good position. Stable right-sided pulmonary findings compared to prior exam. IMPRESSION: Endotracheal and nasogastric tubes appear to be in grossly good position. Electronically Signed   By: Lupita Raider M.D.   On: 08/08/2023 11:50   [place] EKG SITE RITE Result Date: 08/08/2023 If Site Rite image not attached, placement could not be confirmed due to current cardiac rhythm.   Labs:  CBC: Recent Labs    08/10/23 1620 08/10/23 2358 08/11/23 0037 08/11/23 0807 08/11/23 2304 08/12/23 0500  WBC 16.8*  --  16.5*  --  27.3* 25.7*  HGB 7.6*   < > 7.0* 7.5* 8.0* 7.5*  HCT 23.8*   < > 21.6* 22.0* 24.3* 22.7*  PLT 67*  --  57*  --  72* 73*   < > = values in this interval not displayed.    COAGS: Recent Labs    04/06/23 1624 08/07/23 0243 08/08/23 0812  08/09/23 0533 08/10/23 0521 08/11/23 0037  INR 1.1   < > 1.4* 1.4* 1.4* 1.4*  APTT 30  --   --   --   --   --    < > = values in this interval not displayed.    BMP: Recent Labs    08/10/23 0521 08/10/23 1620 08/10/23 2358 08/11/23 0037 08/11/23 0807 08/12/23 0500  NA 133* 135 136 136 137 137  K 4.3 4.1 3.9 4.0 3.8 3.8  CL 107 104  --  104  --  105  CO2  12* 18*  --  14*  --  16*  GLUCOSE 203* 152*  --  107*  --  170*  BUN 57* 55*  --  54*  --  68*  CALCIUM 7.8* 7.7*  --  7.5*  --  7.7*  CREATININE 1.40* 1.30*  --  1.33*  --  1.37*  GFRNONAA 53* 58*  --  56*  --  54*    LIVER FUNCTION TESTS: Recent Labs    08/10/23 0521 08/11/23 0037 08/11/23 0453 08/12/23 0500  BILITOT 3.9* 4.9* 5.1* 6.2*  AST 110* 135* 141* 134*  ALT 138* 106* 104* 77*  ALKPHOS 150* 135* 141* 143*  PROT 3.5* 3.0* 3.3* 3.9*  ALBUMIN <1.5* <1.5* <1.5* 2.2*    Assessment and Plan:  Septic shock; cholecystitis; request for image guided percutaneous cholecystostomy  - INR 1.4, WBC 25.7, Hgb 7.5, T bili 6.2, Lactic 8.2 on 4/2 labs; reviewed by Dr. Loreta Ave - requiring amio, levo, vaso, and neo  - Heparin paused - 3/31 US showed distended gallbladder without obv stones or ductal dilation. 4/1 CT suggests stone. - 4/1 NM hepatobiliary imaging: No radiotracer excreted into the bile ducts over 2 hours of imaging. Differential includes biliary obstruction versus hepatocellular disease such as hepatitis. Recommend clinical correlation. - Case discussed with Dr. Loreta Ave;  Plan for cholecystostomy placement 4/2 by IR.      Electronically Signed: Carlton Adam, NP 08/12/2023, 9:55 AM   I spent a total of 25 Minutes at the the patient's bedside AND on the patient's hospital floor or unit, greater than 50% of which was counseling/coordinating care for image guided cholecystostomy

## 2023-08-12 NOTE — Progress Notes (Signed)
 PHARMACY - ANTICOAGULATION CONSULT NOTE  Pharmacy Consult for heparin Indication: stroke in setting of PAF and hypercoaguable state  Labs: Recent Labs    08/10/23 0521 08/10/23 1214 08/10/23 1620 08/10/23 2209 08/11/23 0037 08/11/23 0453 08/11/23 0807 08/11/23 1400 08/11/23 2304 08/12/23 0500 08/12/23 0757 08/12/23 2217  HGB  --    < > 7.6*   < > 7.0*  --  7.5*  --  8.0* 7.5*  --   --   HCT  --    < > 23.8*   < > 21.6*  --  22.0*  --  24.3* 22.7*  --   --   PLT  --    < > 67*  --  57*  --   --   --  72* 73*  --   --   LABPROT 17.6*  --   --   --  17.5*  --   --   --   --   --   --   --   INR 1.4*  --   --   --  1.4*  --   --   --   --   --   --   --   HEPARINUNFRC  --    < >  --    < >  --  0.21*  --    < > 0.17*  --  0.16* 0.13*  CREATININE 1.40*  --  1.30*  --  1.33*  --   --   --   --  1.37*  --   --   TROPONINIHS  --   --   --   --   --  39*  --   --   --   --   --   --    < > = values in this interval not displayed.   Assessment: 73yo male subtherapeutic on heparin after resuming s/p IR procedure; no infusion issues or signs of bleeding per RN.  Goal of Therapy:  Heparin level 0.3-0.5 units/ml   Plan:  Increase heparin infusion by 3 units/kg/hr to 2200 units/hr. Check level in 8 hours.   Vernard Gambles, PharmD, BCPS 08/12/2023 11:50 PM

## 2023-08-12 NOTE — IPAL (Signed)
  Interdisciplinary Goals of Care Family Meeting   Date carried out: 08/12/2023  Location of the meeting: Bedside  Member's involved: Family Member or next of kin, Palliative care team member, and Other: Mertha Baars, PA-C  Durable Power of Attorney or acting medical decision maker: wife, [name]     Discussion: We discussed goals of care for Kenneth [name] . Palliative care Lamarr Lulas, NP, myself, [name] (wife), nephew Brynda Greathouse, sister [name] all at bedside during discussion. We had a long conversation about where we are currently at and plan going forward to place IR drain in hopes of improving current shock state. We discussed that he is on multiple forms of life support including ventilation and 2 vasopressors. Discussed that there is potential to decompensate at any time and that chest compressions would likely not change outcome, would be violent, and if he were to survive would diminish quality of life significantly. [name] is very hopeful that biliary drain will help to turn things around and would like to focus on that at this time. She is also still concerned about stopping his recurrent strokes. Discussed that getting him through this septic shock is top priority prior to turning focus to recovery or further plans at this time. Myself and palliative care helped to answer all questions by wife and family. They decided as a family to make him DNR, no compressions. She would like to continue with all other efforts at this time including increasing vasopressor or ventilatory support as needed, antibiotics, cardiac gtts like amiodarone, etc.   Code status:   Code Status: Do not attempt resuscitation (DNR) PRE-ARREST INTERVENTIONS DESIRED   Disposition: Continue current acute care  Time spent for the meeting: 45  Cristopher Peru, PA-C [facility] 08/12/23 12:19 PM  Please see [email or web address] for pager details.  From 7A-7P if no response, please call  [phone] After hours, please call ELink [phone]

## 2023-08-12 NOTE — Sedation Documentation (Signed)
 Patient moved by staff to procedure table, secured to table & monitors.

## 2023-08-12 NOTE — Sedation Documentation (Signed)
 ICU RN & ICU respiratory therapist at bedside with patient, due to stay for the remaining of the procedure.

## 2023-08-12 NOTE — Procedures (Signed)
 Interventional Radiology Procedure Note  Procedure: Image guided perc chole placement, .  14F pigtail drain.  Complications: None  EBL: None Sample: Culture sent  Recommendations: - Routine drain care, with sterile flushes, record output - follow up Cx - routine wound care - ok to restart heparin as needed  Signed,  Yvone Neu. Loreta Ave, DO, ABVM, RPVI

## 2023-08-12 NOTE — Progress Notes (Signed)
 RT NOTE: RT transported patient on ventilator from room 4N24 to IR and back with no complications.

## 2023-08-12 NOTE — Sedation Documentation (Signed)
 Pt transported to patient room with RT, IR RN, & ICU RN. Pt stable from procedural standpoint with no issues.

## 2023-08-13 DIAGNOSIS — E785 Hyperlipidemia, unspecified: Secondary | ICD-10-CM | POA: Diagnosis not present

## 2023-08-13 DIAGNOSIS — R7401 Elevation of levels of liver transaminase levels: Secondary | ICD-10-CM | POA: Diagnosis not present

## 2023-08-13 DIAGNOSIS — J9601 Acute respiratory failure with hypoxia: Secondary | ICD-10-CM | POA: Diagnosis not present

## 2023-08-13 DIAGNOSIS — N179 Acute kidney failure, unspecified: Secondary | ICD-10-CM | POA: Diagnosis not present

## 2023-08-13 DIAGNOSIS — D649 Anemia, unspecified: Secondary | ICD-10-CM | POA: Diagnosis not present

## 2023-08-13 DIAGNOSIS — D6859 Other primary thrombophilia: Secondary | ICD-10-CM | POA: Diagnosis not present

## 2023-08-13 DIAGNOSIS — I63443 Cerebral infarction due to embolism of bilateral cerebellar arteries: Secondary | ICD-10-CM | POA: Diagnosis not present

## 2023-08-13 LAB — CBC WITH DIFFERENTIAL/PLATELET
Abs Immature Granulocytes: 0 10*3/uL (ref 0.00–0.07)
Basophils Absolute: 0 10*3/uL (ref 0.0–0.1)
Basophils Relative: 0 %
Eosinophils Absolute: 0 10*3/uL (ref 0.0–0.5)
Eosinophils Relative: 0 %
HCT: 23.1 % — ABNORMAL LOW (ref 39.0–52.0)
Hemoglobin: 7.6 g/dL — ABNORMAL LOW (ref 13.0–17.0)
Lymphocytes Relative: 2 %
Lymphs Abs: 0.5 10*3/uL — ABNORMAL LOW (ref 0.7–4.0)
MCH: 28.7 pg (ref 26.0–34.0)
MCHC: 32.9 g/dL (ref 30.0–36.0)
MCV: 87.2 fL (ref 80.0–100.0)
Monocytes Absolute: 1 10*3/uL (ref 0.1–1.0)
Monocytes Relative: 4 %
Neutro Abs: 23.7 10*3/uL — ABNORMAL HIGH (ref 1.7–7.7)
Neutrophils Relative %: 94 %
Platelets: 65 10*3/uL — ABNORMAL LOW (ref 150–400)
RBC: 2.65 MIL/uL — ABNORMAL LOW (ref 4.22–5.81)
RDW: 16.6 % — ABNORMAL HIGH (ref 11.5–15.5)
WBC: 25.2 10*3/uL — ABNORMAL HIGH (ref 4.0–10.5)
nRBC: 0 /100{WBCs}
nRBC: 0.4 % — ABNORMAL HIGH (ref 0.0–0.2)

## 2023-08-13 LAB — COMPREHENSIVE METABOLIC PANEL WITH GFR
ALT: 66 U/L — ABNORMAL HIGH (ref 0–44)
AST: 161 U/L — ABNORMAL HIGH (ref 15–41)
Albumin: 1.7 g/dL — ABNORMAL LOW (ref 3.5–5.0)
Alkaline Phosphatase: 140 U/L — ABNORMAL HIGH (ref 38–126)
Anion gap: 18 — ABNORMAL HIGH (ref 5–15)
BUN: 89 mg/dL — ABNORMAL HIGH (ref 8–23)
CO2: 20 mmol/L — ABNORMAL LOW (ref 22–32)
Calcium: 8 mg/dL — ABNORMAL LOW (ref 8.9–10.3)
Chloride: 102 mmol/L (ref 98–111)
Creatinine, Ser: 1.56 mg/dL — ABNORMAL HIGH (ref 0.61–1.24)
GFR, Estimated: 46 mL/min — ABNORMAL LOW (ref >60)
Glucose, Bld: 212 mg/dL — ABNORMAL HIGH (ref 70–99)
Potassium: 3.5 mmol/L (ref 3.5–5.1)
Sodium: 140 mmol/L (ref 135–145)
Total Bilirubin: 10.7 mg/dL — ABNORMAL HIGH (ref 0.0–1.2)
Total Protein: 3.5 g/dL — ABNORMAL LOW (ref 6.5–8.1)

## 2023-08-13 LAB — GLUCOSE, CAPILLARY
Glucose-Capillary: 191 mg/dL — ABNORMAL HIGH (ref 70–99)
Glucose-Capillary: 203 mg/dL — ABNORMAL HIGH (ref 70–99)
Glucose-Capillary: 215 mg/dL — ABNORMAL HIGH (ref 70–99)
Glucose-Capillary: 218 mg/dL — ABNORMAL HIGH (ref 70–99)
Glucose-Capillary: 232 mg/dL — ABNORMAL HIGH (ref 70–99)
Glucose-Capillary: 237 mg/dL — ABNORMAL HIGH (ref 70–99)

## 2023-08-13 LAB — LACTIC ACID, PLASMA
Lactic Acid, Venous: >9 mmol/L (ref 0.5–1.9)
Lactic Acid, Venous: >9 mmol/L (ref 0.5–1.9)

## 2023-08-13 LAB — HEPARIN LEVEL (UNFRACTIONATED)
Heparin Unfractionated: 0.21 [IU]/mL — ABNORMAL LOW (ref 0.30–0.70)
Heparin Unfractionated: 0.26 [IU]/mL — ABNORMAL LOW (ref 0.30–0.70)
Heparin Unfractionated: 0.3 [IU]/mL (ref 0.30–0.70)

## 2023-08-13 MED ORDER — INSULIN ASPART 100 UNIT/ML IJ SOLN
2.0000 [IU] | INTRAMUSCULAR | Status: DC
  Administered 2023-08-13 – 2023-08-14 (×5): 2 [IU] via SUBCUTANEOUS

## 2023-08-13 MED ORDER — VANCOMYCIN HCL 1500 MG/300ML IV SOLN: 1500.0000 mg | INTRAVENOUS | Status: DC

## 2023-08-13 MED ORDER — FUROSEMIDE 10 MG/ML IJ SOLN
120.0000 mg | Freq: Once | INTRAVENOUS | Status: AC
  Administered 2023-08-13: 120 mg via INTRAVENOUS
  Filled 2023-08-13: qty 2

## 2023-08-13 MED ORDER — INSULIN GLARGINE-YFGN 100 UNIT/ML ~~LOC~~ SOLN
40.0000 [IU] | Freq: Every day | SUBCUTANEOUS | Status: DC
Start: 2023-08-14 — End: 2023-08-15
  Administered 2023-08-14: 40 [IU] via SUBCUTANEOUS
  Filled 2023-08-13 (×2): qty 0.4

## 2023-08-13 NOTE — Progress Notes (Signed)
 Referring Physician(s): Jonna Clark, PA-C  Supervising Physician: Oley Balm  Patient Status:  [place] - In-pt  Chief Complaint: Acute cholecystitis s/p percutaneous cholecystostomy in IR 08/12/23  Subjective: Patient remains intubated/sedated in the ICU.   Allergies: Jardiance [empagliflozin], Fluoxetine, Penicillins, and Silodosin  Medications: Prior to Admission medications   Medication Sig Start Date End Date Taking? Authorizing Provider  aspirin 81 MG chewable tablet Chew 1 tablet (81 mg total) by mouth daily. 04/08/23  Yes Marinda Elk, MD  atorvastatin (LIPITOR) 40 MG tablet Take 1 tablet (40 mg total) by mouth daily. Patient taking differently: Take 40 mg by mouth at bedtime. 10/17/22  Yes Love, Evlyn Kanner, PA-C  benazepril (LOTENSIN) 40 MG tablet Take 40 mg by mouth daily.   Yes [provider]  BRILINTA 90 MG TABS tablet Take 90 mg by mouth 2 (two) times daily. 04/24/23  Yes [provider]  chlorthalidone (HYGROTON) 25 MG tablet Take 25 mg by mouth daily.   Yes [provider]  Cholecalciferol (VITAMIN D3) 1000 units CAPS Take 1,000 Units by mouth in the morning and at bedtime.   Yes [provider]  Continuous Glucose Sensor (FREESTYLE LIBRE 14 DAY SENSOR) MISC Inject 1 Device into the skin every 14 (fourteen) days.   Yes [provider]  escitalopram (LEXAPRO) 20 MG tablet Take 20 mg by mouth daily.   Yes [provider]  ezetimibe (ZETIA) 10 MG tablet Take 10 mg by mouth daily. 01/19/20  Yes [provider]  metFORMIN (GLUCOPHAGE-XR) 500 MG 24 hr tablet Take 500 mg by mouth in the morning and at bedtime.   Yes [provider]  tamsulosin (FLOMAX) 0.4 MG CAPS capsule Take 0.4 mg by mouth daily. 07/31/23  Yes [provider]  TOUJEO SOLOSTAR 300 UNIT/ML Solostar Pen Inject 40 Units into the skin in the morning. 04/08/23  Yes Marinda Elk, MD  MOUNJARO 5 MG/0.5ML Pen Inject 5 mg into  the skin every Monday. Patient not taking: Reported on 08/01/2023    [provider]     Vital Signs: BP (!) 141/55   Pulse 60   Temp 98.7 F (37.1 C) (Axillary)   Resp (!) 35   Ht 5' 10.98" (1.803 m)   Wt 233 lb 11 oz (106 kg)   SpO2 100%   BMI 32.61 kg/m   Physical Exam Constitutional:      Comments: Intubated/sedated   Pulmonary:     Comments: Intubated/sedated  Abdominal:     Comments: RUQ drain to gravity. Approximately 25 ml of dark, thick bile in bag. Dressing is clean/dry/intact   Musculoskeletal:     Comments: Anasarca      Imaging: IR Perc Cholecystostomy Result Date: 08/12/2023 INDICATION: 73 year old male with sepsis referred for percutaneous cholecystostomy EXAM: CHOLECYSTOSTOMY MEDICATIONS: None ANESTHESIA/SEDATION: Moderate (conscious) sedation was not employed during this procedure. The patient is intubated with fentanyl drip FLUOROSCOPY TIME:  Fluoroscopy Time:   (2.5 mGy). COMPLICATIONS: None PROCEDURE: Informed written consent was obtained from the patient's family after a thorough discussion of the procedural risks, benefits and alternatives. All questions were addressed. Maximal Sterile Barrier Technique was utilized including caps, mask, sterile gowns, sterile gloves, sterile drape, hand hygiene and skin antiseptic. A timeout was performed prior to the initiation of the procedure. Ultrasound survey of the right upper quadrant was performed for planning purposes. Once the patient is prepped and draped in the usual sterile fashion, the skin and subcutaneous tissues overlying the gallbladder were  generously infiltrated 1% lidocaine for local anesthesia. A coaxial needle was advanced under ultrasound guidance through the skin subcutaneous tissues and a small segment of liver into the gallbladder lumen. With removal of the stylet, spontaneous dark bile drainage occurred. Using modified Seldinger technique, a 10 French drain was placed into the gallbladder fossa,  with aspiration of the sample for the lab. Contrast injection confirmed position of the tube within the gallbladder lumen. Drainage catheter was attached to gravity drain with a suture retention placed. Patient tolerated the procedure well and remained hemodynamically stable throughout. No complications were encountered and no significant blood loss encountered. IMPRESSION: Status post image guided percutaneous cholecystostomy Signed, Yvone Neu. Miachel Roux, RPVI [facility] Electronically Signed   By: Gilmer Mor D.O.   On: 08/12/2023 14:14   CT HEAD WO CONTRAST ( ) Result Date: 08/11/2023 CLINICAL DATA:  Encephalopathy EXAM: CT HEAD WITHOUT CONTRAST TECHNIQUE: Contiguous axial images were obtained from the base of the skull through the vertex without intravenous contrast. RADIATION DOSE REDUCTION: This exam was performed according to the departmental dose-optimization program which includes automated exposure control, adjustment of the mA and/or kV according to patient size and/or use of iterative reconstruction technique. COMPARISON:  08/04/2023 FINDINGS: Brain: Diffuse cerebral atrophy. No acute intracranial abnormality. Specifically, no hemorrhage, hydrocephalus, mass lesion, acute infarction, or significant intracranial injury. Vascular: No hyperdense vessel or unexpected calcification. Skull: No acute calvarial abnormality. Sinuses/Orbits: No acute findings Other: None IMPRESSION: Atrophy.  No acute intracranial abnormality. Electronically Signed   By: Charlett Nose M.D.   On: 08/11/2023 19:31   CT ABDOMEN PELVIS W CONTRAST Result Date: 08/11/2023 CLINICAL DATA:  Sepsis EXAM: CT ABDOMEN AND PELVIS WITH CONTRAST TECHNIQUE: Multidetector CT imaging of the abdomen and pelvis was performed using the standard protocol following bolus administration of intravenous contrast. RADIATION DOSE REDUCTION: This exam was performed according to the  departmental dose-optimization program which includes automated exposure control, adjustment of the mA and/or kV according to patient size and/or use of iterative reconstruction technique. CONTRAST:  75mL OMNIPAQUE IOHEXOL 350 MG/ML SOLN COMPARISON:  08/07/2023 FINDINGS: Lower chest: See chest CT report from today. Hepatobiliary: Small layering stones within the gallbladder. No focal hepatic abnormality or biliary ductal dilatation. Pancreas: No focal abnormality or ductal dilatation. Spleen: 3.1 cm low-density lesion in the superior aspect of the spleen, unchanged. Normal size. Adrenals/Urinary Tract: Bilateral adrenal nodules again noted, unchanged. Mild left hydronephrosis is unchanged. Left ureter is decompressed. Findings compatible with chronic UPJ obstruction. No hydronephrosis on the right. Right upper pole renal cyst is stable. No follow-up imaging recommended. Urinary bladder decompressed with Foley catheter in place. Stomach/Bowel: Normal appendix. Scattered colonic diverticulosis. No active diverticulitis. Stomach and small bowel decompressed. NG tube tip is in the proximal duodenum. Vascular/Lymphatic: Aortic atherosclerosis. No evidence of aneurysm or adenopathy. Reproductive: Mild prostate hypertrophy. Surgical clips or radiation seeds adjacent to the prostate. Other: Small amount of free fluid in the pelvis. Diffuse edema throughout the subcutaneous soft tissues compatible with anasarca, worsening since prior study. Bilateral inguinal hernias containing fat. Musculoskeletal: No acute bony abnormality. IMPRESSION: No acute findings in the abdomen or pelvis. Stable chronic UPJ obstruction. Cholelithiasis. Stable low-density lesion in the superior aspect of the spleen with differential remaining the same. This could be further evaluated with non emergent MRI. Small amount of free fluid in the pelvis. Aortoiliac atherosclerosis. Diffuse anasarca throughout the subcutaneous soft tissues. Electronically  Signed   By: Charlett Nose M.D.  On: 08/11/2023 19:30   CT Angio Chest Pulmonary Embolism (PE) W or WO Contrast Result Date: 08/11/2023 CLINICAL DATA:  Pulmonary embolism (PE) suspected, low to intermediate prob, positive D-dimer. Hypoxia EXAM: CT ANGIOGRAPHY CHEST WITH CONTRAST TECHNIQUE: Multidetector CT imaging of the chest was performed using the standard protocol during bolus administration of intravenous contrast. Multiplanar CT image reconstructions and MIPs were obtained to evaluate the vascular anatomy. RADIATION DOSE REDUCTION: This exam was performed according to the departmental dose-optimization program which includes automated exposure control, adjustment of the mA and/or kV according to patient size and/or use of iterative reconstruction technique. CONTRAST:  75mL OMNIPAQUE IOHEXOL 350 MG/ML SOLN COMPARISON:  08/06/2023 FINDINGS: Cardiovascular: No filling defects in the pulmonary arteries to suggest pulmonary emboli. Heart is normal size. Aorta is normal caliber. Scattered coronary artery and aortic atherosclerosis. Mediastinum/Nodes: No mediastinal, hilar, or axillary adenopathy. Trachea and esophagus are unremarkable. Thyroid unremarkable. Endotracheal tube tip in the midtrachea. Lungs/Pleura: Small right pleural effusion with pleural thickening. Small left pleural effusion, new since prior study. Dependent atelectasis in the lower lobes. Scarring or atelectasis in the right middle lobe. Upper Abdomen: No acute findings. Musculoskeletal: Chest wall soft tissues are unremarkable. No acute bony abnormality. Review of the MIP images confirms the above findings. IMPRESSION: No evidence of pulmonary embolus. Small bilateral pleural effusions, new on the left since prior study. Associated right pleural thickening. Bibasilar atelectasis. Right middle lobe atelectasis or scarring, similar to prior study. Coronary artery disease. Aortic Atherosclerosis (ICD10-I70.0). Electronically Signed   By: Charlett Nose M.D.   On: 08/11/2023 19:24   NM Hepatobiliary Liver Func Result Date: 08/11/2023 CLINICAL DATA:  Elevated bilirubin. Concern for biliary obstruction. EXAM: NUCLEAR MEDICINE HEPATOBILIARY IMAGING TECHNIQUE: Sequential images of the abdomen were obtained out to 60 minutes following intravenous administration of radiopharmaceutical. RADIOPHARMACEUTICALS:  5.1 mCi Tc-55m  Choletec IV COMPARISON:  Ultrasound 08/10/2023, CT 08/07/2023 FINDINGS: Delayed clearance of radiotracer from blood pool related to hyperbilirubinemia. Uniform uptake within the liver. No counts are excreted into the extrahepatic bile ducts over the course of 2 hour imaging. IMPRESSION: No radiotracer excreted into the bile ducts over 2 hours of imaging. Differential includes biliary obstruction versus hepatocellular disease such as hepatitis. Recommend clinical correlation. Electronically Signed   By: Genevive Bi M.D.   On: 08/11/2023 16:45   ECHOCARDIOGRAM LIMITED Result Date: 08/11/2023    ECHOCARDIOGRAM LIMITED REPORT   Patient Name:   ANTWONE CAPOZZOLI Date of Exam: 08/11/2023 Medical Rec #:  [id number]        Height:       71.0 in Accession #:    [id number]       Weight:       227.1 lb Date of Birth:  12-05-1950        BSA:          2.225 m Patient Age:    72 years         BP:           143/54 mmHg Patient Gender: M                HR:           56 bpm. Exam Location:  Inpatient Procedure: Limited Echo, Color Doppler and Cardiac Doppler (Both Spectral and            Color Flow Doppler were utilized during procedure). Indications:    Shock  History:        Patient has prior history of  Echocardiogram examinations.                 Stroke, Signs/Symptoms:Syncope; Risk Factors:Diabetes, Former                 Smoker and Sleep Apnea.  Sonographer:    Lamont Snowball Referring Phys: [id number] Karl Ito IMPRESSIONS  1. Left ventricular ejection fraction, by estimation, is 55 to 60%. The left ventricle has normal function. The left ventricle  has no regional wall motion abnormalities.  2. Mildly D-shaped septum suggests a degree of RV pressure/volume overload. Right ventricular systolic function is mildly reduced. The right ventricular size is mildly enlarged. Tricuspid regurgitation signal is inadequate for assessing PA pressure.  3. The mitral valve is normal in structure. Trivial mitral valve regurgitation. No evidence of mitral stenosis.  4. The aortic valve is tricuspid. There is moderate calcification of the aortic valve. Aortic valve regurgitation is not visualized. Aortic valve sclerosis/calcification is present, without any evidence of aortic stenosis. Aortic valve mean gradient measures 8.0 mmHg.  5. The inferior vena cava is dilated in size with <50% respiratory variability, suggesting right atrial pressure of 15 mmHg.  6. Limited echo FINDINGS  Left Ventricle: Left ventricular ejection fraction, by estimation, is 55 to 60%. The left ventricle has normal function. The left ventricle has no regional wall motion abnormalities. The left ventricular internal cavity size was normal in size. There is  no left ventricular hypertrophy. Right Ventricle: Mildly D-shaped septum suggests a degree of RV pressure/volume overload. The right ventricular size is mildly enlarged. No increase in right ventricular wall thickness. Right ventricular systolic function is mildly reduced. Tricuspid regurgitation signal is inadequate for assessing PA pressure. Left Atrium: Left atrial size was normal in size. Right Atrium: Right atrial size was normal in size. Pericardium: There is no evidence of pericardial effusion. Mitral Valve: The mitral valve is normal in structure. Mild mitral annular calcification. Trivial mitral valve regurgitation. No evidence of mitral valve stenosis. Tricuspid Valve: The tricuspid valve is normal in structure. Tricuspid valve regurgitation is mild. Aortic Valve: The aortic valve is tricuspid. There is moderate calcification of the aortic  valve. Aortic valve regurgitation is not visualized. Aortic valve sclerosis/calcification is present, without any evidence of aortic stenosis. Aortic valve mean gradient measures 8.0 mmHg. Aortic valve peak gradient measures 15.4 mmHg. Aortic valve area, by VTI measures 1.57 cm. Aorta: The aortic root is normal in size and structure. Venous: The inferior vena cava is dilated in size with less than 50% respiratory variability, suggesting right atrial pressure of 15 mmHg. LEFT VENTRICLE PLAX 2D LVIDd:         5.00 cm LVIDs:         3.30 cm LV PW:         0.80 cm LV IVS:        0.80 cm LVOT diam:     1.90 cm LV SV:         66 LV SV Index:   30 LVOT Area:     2.84 cm  IVC IVC diam: 2.50 cm AORTIC VALVE AV Area (Vmax):    1.50 cm AV Area (Vmean):   1.57 cm AV Area (VTI):     1.57 cm AV Vmax:           196.00 cm/s AV Vmean:          129.000 cm/s AV VTI:            0.420 m AV Peak Grad:  15.4 mmHg AV Mean Grad:      8.0 mmHg LVOT Vmax:         104.00 cm/s LVOT Vmean:        71.600 cm/s LVOT VTI:          0.232 m LVOT/AV VTI ratio: 0.55  AORTA Ao Root diam: 2.90 cm  SHUNTS Systemic VTI:  0.23 m Systemic Diam: 1.90 cm Dalton McleanMD Electronically signed by Wilfred Lacy Signature Date/Time: 08/11/2023/9:33:29 AM    Final    DG CHEST PORT 1 VIEW Result Date: 08/10/2023 CLINICAL DATA:  Hypoxia EXAM: PORTABLE CHEST 1 VIEW COMPARISON:  X-ray 08/08/2023 and older FINDINGS: Stable ET tube, enteric tube. Placement of a left-sided PICC with tip overlying the central SVC above the right atrium. Presumed loop recorder along the lower left hemithorax. Stable cardiopericardial silhouette. No pneumothorax. Slight increase in small right pleural effusion. Mild adjacent opacity is again noted. No edema. Prominence of the central vasculature. Overlapping cardiac leads. IMPRESSION: New left-sided PICC with tip along the central SVC above the right atrium. Slight increase in tiny right pleural effusion. Electronically Signed    By: Karen Kays M.D.   On: 08/10/2023 16:00   US Abdomen Limited RUQ (LIVER/GB) Result Date: 08/10/2023 CLINICAL DATA:  Hyperbilirubinemia. EXAM: ULTRASOUND ABDOMEN LIMITED RIGHT UPPER QUADRANT COMPARISON:  08/06/2023 FINDINGS: Gallbladder: Dilated gallbladder with some borderline wall thickening of 3 mm. Several internal echoes consistent with sludge. No shadowing stones. Patient is on a ventilator. Common bile duct: Diameter: 6 mm Liver: No focal lesion identified. Within normal limits in parenchymal echogenicity. Portal vein is patent on color Doppler imaging with normal direction of blood flow towards the liver. Other: Trace ascites. IMPRESSION: Distended gallbladder with borderline wall thickening and sludge. No obvious stones or ductal dilatation. Trace ascites. Electronically Signed   By: Karen Kays M.D.   On: 08/10/2023 15:59    Labs:  CBC: Recent Labs    08/11/23 0037 08/11/23 0807 08/11/23 2304 08/12/23 0500 08/13/23 0422  WBC 16.5*  --  27.3* 25.7* 25.2*  HGB 7.0* 7.5* 8.0* 7.5* 7.6*  HCT 21.6* 22.0* 24.3* 22.7* 23.1*  PLT 57*  --  72* 73* 65*    COAGS: Recent Labs    04/06/23 1624 08/07/23 0243 08/08/23 0812 08/09/23 0533 08/10/23 0521 08/11/23 0037  INR 1.1   < > 1.4* 1.4* 1.4* 1.4*  APTT 30  --   --   --   --   --    < > = values in this interval not displayed.    BMP: Recent Labs    08/10/23 1620 08/10/23 2358 08/11/23 0037 08/11/23 0807 08/12/23 0500 08/13/23 0925  NA 135   < > 136 137 137 140  K 4.1   < > 4.0 3.8 3.8 3.5  CL 104  --  104  --  105 102  CO2 18*  --  14*  --  16* 20*  GLUCOSE 152*  --  107*  --  170* 212*  BUN 55*  --  54*  --  68* 89*  CALCIUM 7.7*  --  7.5*  --  7.7* 8.0*  CREATININE 1.30*  --  1.33*  --  1.37* 1.56*  GFRNONAA 58*  --  56*  --  54* 46*   < > = values in this interval not displayed.    LIVER FUNCTION TESTS: Recent Labs    08/11/23 0037 08/11/23 0453 08/12/23 0500 08/13/23 0925  BILITOT 4.9* 5.1* 6.2*  10.7*  AST 135* 141* 134* 161*  ALT 106* 104* 77* 66*  ALKPHOS 135* 141* 143* 140*  PROT 3.0* 3.3* 3.9* 3.5*  ALBUMIN <1.5* <1.5* 2.2* 1.7*    Assessment and Plan:  Acute cholecystitis s/p percutaneous cholecystostomy in IR 08/12/23  Patient remains critically ill in the ICU. He is on the ventilator and still requiring pressor support with levophed. T. Bili 10.7, WBC remains elevated at 25.2 and his creatinine level has also increased to 1.56. Lactic acid still elevated at 9.    Drain Location: RUQ Size: Fr size: 10 Fr Date of placement: 08/13/23  Currently to: Drain collection device: gravity 24 hour output:  Output by Drain (mL) 08/11/23 0700 - 08/11/23 1459 08/11/23 1500 - 08/11/23 2259 08/11/23 2300 - 08/12/23 0659 08/12/23 0700 - 08/12/23 1459 08/12/23 1500 - 08/12/23 2259 08/12/23 2300 - 08/13/23 0659 08/13/23 0700 - 08/13/23 1459 08/13/23 1500 - 08/13/23 1511  Biliary Tube Cholecystostomy 10 Fr. RUQ     30 20      Interval imaging/drain manipulation:  none  Current examination: Suture and stat lock in place. Dressed appropriately.   Plan: Continue TID flushes with 5 cc NS. Record output Q shift. Dressing changes QD or PRN if soiled.  Call IR APP or on call IR MD if difficulty flushing or sudden change in drain output.  Repeat imaging/possible drain injection once output < 10 mL/QD (excluding flush material). Consideration for drain removal if output is < 10 mL/QD (excluding flush material), pending discussion with the providing surgical service.  Discharge planning: Percutaneous cholecystostomy drain to remain in place at least 6 weeks. Recommend fluoroscopy with injection of the drain in IR to evaluate for patency of the cystic duct. If the duct is patent and general surgery feels patient is stable for cholecystectomy, the drain would be removed at time of surgery. If the duct is patent and general surgery feels patient is NEVER a candidate for cholecystectomy, drain can  be capped for a trial. If symptoms recur, then place to gravity bag again. If trial is successful, discuss possible removal of the drain.  IR will continue to follow - please call with questions or concerns.  Electronically Signed: Alwyn Ren, AGACNP-BC 08/13/2023, 3:06 PM   I spent a total of 15 Minutes at the the patient's bedside AND on the patient's hospital floor or unit, greater than 50% of which was counseling/coordinating care for cholecystitis

## 2023-08-13 NOTE — Progress Notes (Signed)
 PHARMACY - ANTICOAGULATION CONSULT NOTE  Pharmacy Consult for heparin Indication: stroke  Patient Measurements: Height: 5' 10.98" (180.3 cm) Weight: 106 kg (233 lb 11 oz) IBW/kg (Calculated) : 75.26 HEPARIN DW (KG): 86.6  Vital Signs: Temp: 99.4 F (37.4 C) (04/03 0800) Temp Source: Axillary (04/03 0800) BP: 136/51 (04/03 0815) Pulse Rate: 60 (04/03 0815)  Labs: Recent Labs    08/10/23 1620 08/10/23 2209 08/11/23 0037 08/11/23 0453 08/11/23 0807 08/11/23 2304 08/12/23 0500 08/12/23 0757 08/12/23 2217 08/13/23 0422 08/13/23 0801  HGB 7.6*   < > 7.0*  --    < > 8.0* 7.5*  --   --  7.6*  --   HCT 23.8*   < > 21.6*  --    < > 24.3* 22.7*  --   --  23.1*  --   PLT 67*  --  57*  --   --  72* 73*  --   --  65*  --   LABPROT  --   --  17.5*  --   --   --   --   --   --   --   --   INR  --   --  1.4*  --   --   --   --   --   --   --   --   HEPARINUNFRC  --    < >  --  0.21*   < > 0.17*  --    < > 0.13* 0.21* 0.26*  CREATININE 1.30*  --  1.33*  --   --   --  1.37*  --   --   --   --   TROPONINIHS  --   --   --  39*  --   --   --   --   --   --   --    < > = values in this interval not displayed.    Estimated Creatinine Clearance: 58.6 mL/min (A) (by C-G formula based on SCr of 1.37 mg/dL (H)).  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 Patient was admitted 3/22 for progressive weakness. 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 Pharmacy consulted to dose heparin 3/30.  Heparin level remains subtherapeutic, trending up at 0.26 despite rate increases, cautious titration without  boluses due to risk of bleeding. Hgb 7.6 - low stable. Platelets 65 - low stable. S/p 1 unit PRBC 315, Occult stool negative 4/1.   Goal of Therapy:  Heparin level 0.3-0.5 units/ml Monitor platelets by anticoagulation protocol: Yes   Plan:  Increase heparin gtt to 2350 units/hr F/u 8 hour heparin level  Kenneth [name], PharmD, BCPS, BCCCP Please refer to [facility] for [facility] Pharmacy numbers 08/13/2023 9:12 AM

## 2023-08-13 NOTE — Progress Notes (Signed)
 Pharmacy Antibiotic Note  Kenneth [name] is a 73 y.o. male admitted on 08/01/2023 with pneumonia and intra-abdominal/biliary infection (drain in place).  Pharmacy has been consulted for Vancomycin and Zosyn dosing.  WBC 25.2 (unchanged), ANC 23.7, LA >9, afebrile   Plan: Continue Zosyn 3.375 g EI q8h  Adjust Vancomycin to 1500mg  IV every 48 hours for eAUC 473 (Vd 0.5, SCr used 1.56) Monitor worsening AKI - consider doses based on levels Follow cultures and length of therapy   Height: 5' 10.98" (180.3 cm) Weight: 106 kg (233 lb 11 oz) IBW/kg (Calculated) : 75.26  Temp (24hrs), Avg:98.7 F (37.1 C), Min:97.6 F (36.4 C), Max:99.4 F (37.4 C)  Recent Labs  Lab 08/10/23 0521 08/10/23 1057 08/10/23 1620 08/10/23 1800 08/11/23 0037 08/11/23 0453 08/11/23 2304 08/12/23 0500 08/12/23 1700 08/12/23 2217 08/13/23 0422 08/13/23 0925 08/13/23 1315  WBC  --    < > 16.8*  --  16.5*  --  27.3* 25.7*  --   --  25.2*  --   --   CREATININE 1.40*  --  1.30*  --  1.33*  --   --  1.37*  --   --   --  1.56*  --   LATICACIDVEN 6.0*   < >  --    < > 8.3*   < >  --  8.2* 7.9* 9.0* >9.0*  --  >9.0*   < > = values in this interval not displayed.    Estimated Creatinine Clearance: 51.5 mL/min (A) (by C-G formula based on SCr of 1.56 mg/dL (H)).    Allergies  Allergen Reactions   Jardiance [Empagliflozin] Other (See Comments)    To the hospital- due to complications   Fluoxetine Other (See Comments)    Prefers to NOT take this   Penicillins Other (See Comments)    Tolerated Unasyn March 2025   Silodosin Other (See Comments)    Vertigo     3/28 bcx- MRSE in 1 bottle, likely contaminant  3/28 MRSA nares neg   Thank you for allowing pharmacy to be a part of this patient's care.  Link Snuffer, PharmD, BCPS, BCCCP Please refer to [facility] for [facility] Pharmacy numbers 08/13/2023, 3:28 PM

## 2023-08-13 NOTE — Progress Notes (Signed)
 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   On ED arrival  pt was afebrile, HR:63, BP: 104/47, RR 23. Labs were notable for WBC 7.9, Hgb: 9.0( baseline around 12), Platelets 87, Na: 132, K: 4.1, CO2: 21, Cr, 0.87( Baseline), Transaminases/ ALK Phos WNL, T.bili elevated at 1.9, Mg: 1.4. Troponin : 5--> 4, BNP: 33. FOBT negative, Iron studies consistent with deficiency, B12 low. TSH/T4 WNL. UA grossly normal. RVP negative. CXR no acute, chronic right base changes.  Admitted to [facility] GI was consulted for anemia in the setting of DAPT with recommendation for BID PPI and EGD to r/o occult GIB. EGD completed 3/23 with mild gastritis, no evidence of active bleeding. Neurology consulted(3/25) for new onset confusion. CT head completed 3/25 with NAICA, new small chronic cortical/ subcortical infarct R frontal lobe ( new from prior), known small chronic infarcts, L frontal/ L parietal/ R temporal lobes. EEG (3/25) showed mild diffuse encephalopathy, no seizures. F/u MRI brain (3/25) showed many small acute infarcts concerning for embolic etiology. MRI C-Spine with severe canal stenosis concerning for edema and compressive myelopathy, severe left/moderate right foraminal stenosis, edema of left T1 inferior articular facet. MRI T-spine negative. Given patients progressive weakness in the setting of known C-Spine compression, NSG consulted for  evaluation 3/26 with recommendation for eventual cord decompression once optimized from a medical standpoint.Additionally, psychiatry was consulted for recommendations on medication management for depressed mood. Cardiology was consulted 3/27 for preoperative cardiac risk stratification.  On 3/28AM, patient was reportedly hot to touch per RN with increased RR and WOB and Tmax 103F. CXR obtained 3/28 stable . WBC slightly elevated to 10.8 , INR 1.4, K 3.3, CO2 21. PCT 1.35 ( 0.65), LA 2.2 with repeat >9.   PCCM consulted for concern for developing sepsis, acute liver injury.  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 CT Head/MRI Brain obtained. MRI C-spine and T-spine completed. 3/26 - NSGY evaluation for ?decompression surgery.  3/27 - Cards consult for preoperative risk stratification for possible cord decompression  surgery, tentatively 3/31. 3/28 - Early AM, febrile to Tmax 103F with increased RR/WOB and ?aspiration; LA 2.2 with mild WBC elevation to 10.8; CXR stable. LA > 9 prompted PCCM consult (aberrant value, repeat 2.1). LFTs elevated. Worsening mental status/drowsiness. 3/29  Worsening mental status. Intubated for airway protection. MRI with new 5 mm acute infarct in left pons and numerous acute/subacute infarcts without interval progression 4/1: SVT overnight received adenosine. Followed by aflutter received amio bolus and gtt. In sinus in AM. Ongoing profound septic shock on 3 vasopressors, bicarb gtt. Unclear source but high suspicion for biliary - going for HIDA scan  4/2: IR consulted after HIDA and deferred intervention. He was re-pan scanned with no other obvious source of infection. In AM  with ongoing lactic of 8, shock, and persistently elevating LFTs. IR re-consulted and will evaluate patient for perc drain.  4/3: unfortunately no improvement after IR bili drain placed. Morning lactate 9. Will have ongoing GOC with family/neuro/ccm today  Interim History / Subjective:  Opens eyes to verbal stimulus. Otherwise does not follow commands. Pressors are titrating off, however clinically he has had no improvement. Lactate 9 this morning despite all of our aggressive interventions.  Spoke at bedside with family and neurology.  They wish to continue current measures while keeping him DNR.  Objective:  Blood pressure (!) 136/51, pulse 60, temperature 99.4 F (37.4 C), temperature source Axillary, resp. rate 18, height 5' 10.98" (1.803 m), weight 106 kg, SpO2 99%.    Vent Mode: PRVC FiO2 (%):  [40 %] 40 % Set Rate:  [18 bmp] 18 bmp Vt Set:  [600 mL] 600 mL PEEP:  [5 cmH20] 5 cmH20 Plateau Pressure:  [13 cmH20-19 cmH20] 13 cmH20   Intake/Output Summary (Last 24 hours) at 08/13/2023 1133 Last data filed at 08/13/2023 0800 Gross per 24 hour  Intake 2922.42 ml  Output 1500 ml  Net 1422.42 ml   Filed Weights   08/10/23 0500 08/11/23 0444 08/13/23 0500  Weight: 102.2 kg 103 kg 106 kg   Physical Exam: General: elderly male, critically ill appearing, laying in bed, no acute distress  HENT: [place]/AT, eyes anicteric, pupils equal but sluggish, ett, ogt  Respiratory:  vented, minimal settings, clear bilaterally, diminished bases, no wheezing, rhonchi, rales  Cardiovascular: s1s2, bradycardia, no murmur, rub, gallop GI: rounded, soft, non distended, +BS Extremities: anasarca  Neuro: opens eyes to verbal command, does not move extremities to command, +gag/cough  Resolved Hospital Problem List:   Assessment & Plan:  Cryptogenic CVAs, 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 TCD bubble study 2/15 with "small, clinically insignificant PFO". 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 Failed Plavix and Brilinta - stroke team consulted, appreciate management  - IV heparin for now  - no aspirin per neuro due to worsening thrombocytopenia  - con't hold statin, zetia with liver injury  - serial neuro checks - neuroprotective measures - mirtazapine stopped with encephalopathy - anticipate SNF  - not appropriate for PT/OT at this time  Acute hypoxemic respiratory failure secondary to aspiration in setting of recurrent strokes Tracheal aspirate with staph epi growing. Was also on initial blood culture 1/4 bottles.  - on vanc/zosyn which coverage  - Not appropriate for SBT at this time - full mechanical vent support - lung protective ventilation 6-8cc/kg  Vt - VAP and PAD bundle in place  - titrate FiO2 to sat goal >92  - maintain peak/plats <30, driving pressures <81    Septic shock, unknown etiology, suspect biliary  Lactic Acidosis  Initial 1/4 bottles blood cultures +MRSE, repeat negative. Sputum with MRSE. Increasing lactic acidosis up to 8. No obvious pneumonia. Has had repeat CT abdominal scans negative for acute findings. Urine x2 negative. Has sacral wound but doubt this as etiology. 3/31 with RUQ US showing  borderline wall thickening, stones and sludge without ductal dilation. 4/1 HIDA with differential of obstruction. IR consulted and deferred. 4/2 Ongoing LFT elevations/bili elevation with persistent shock state. IR reconsulted 4/2 and bili drain placed.  4/3 with no improvement, lactic greater than 9 now.  Really unclear source.  He may have had initial insult with pneumonia versus other and now has multiorgan failure. -Continue PERC drain for now -Repeat lactic at 1300 - con't vancomycin, zosyn  - follow culture data  - con't vaso, levo, for MAP greater than 65 - continue hydrocortisone 100mg  q12h  - con't bicarb gtt, may need to come off once bicarb normalizes - completed albumin course - consider repeat if drops back off  - lines were placed after sepsis presentation so doubt line infection - trend lactic, fever, wbc curve   Acute kidney injury Component of multiorgan failure, likely hypoperfusion versus septic ATN. -Given trial of Lasix on 4/2 given that he is 13 L positive.  He did have slight increase in urine output, however serum creatinine rose from 1.37-1.56 with BUN elevation to 89. -Hold further diuresis -- trend bmp, mag, phos - replete elytes - strict I&O - Avoid nephrotoxic agents, renally dose medications - ensure adequate renal perfusion   Elevated LFTs  Hyperbilirubinemia-- worsening Differential includes embolic concern (PVT versus Budd-Chiari, given history of clotting disorder), DILI, acute hepatitis, sepsis, cholecystitis, cholangitis. RUQ as above. Discussed with Dr. Miles Costain with IR 4/1 AM who deferred empiric bili drain prior to HIDA scan. LFTs do not look obstructive with alk phos not significantly elevated. HIDA with differential including obstruction. 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 MRI T-spine negative. - NSGY consulted- tentative plan for cord decompression, waiting on improvement in overall medical status. Anticipate it will be at least a few more days with ongoing metabolic disarray.  -PT, OT when stable  Anemia, multifactorial in the setting of critical illness, B12 deficiency, iron deficiency -transfuse for Hb <7 or hemodynamically significant bleeding -iron, thiamine, folace, B12 supplements  Diffuse anasarca  -Given Lasix on 4/2.  Serum creatinine increased from 1.37-1.56. -Hold further diuresis for now.  T2DM - SSI PRN - goal BG 140-180 - increase glargine to 35U BID with steroids   History of diverticulosis - Noted history  Hard of hearing, congenital -hearing aids available   GOC: palliative spoke with wife 4/1. Plan for meeting 4/2 @ 11A to help with expectations and plan going forward. Clinically, patient would not benefit from  further resuscitation such as CPR if he were to arrest. Will discuss this with palliative and wife today and continue to re-evaluate   Best Practice: (right click and "Reselect all SmartList Selections" daily)   Diet/type: tubefeeds and NPO DVT prophylaxis: systemic heparin GI prophylaxis: PPI Lines: PICC Foley:  yes Code Status:  full code Last date of multidisciplinary goals of care discussion: see GOC above  Critical care time:  35  Cristopher Peru, PA-C [facility] 08/13/23 11:33 AM  Please see [email or web address] for pager details.  From 7A-7P if no response, please call  [phone] After hours, please call ELink [phone]

## 2023-08-13 NOTE — Progress Notes (Signed)
 PHARMACY - ANTICOAGULATION CONSULT NOTE  Pharmacy Consult for heparin Indication: stroke  Patient Measurements: Height: 5' 10.98" (180.3 cm) Weight: 106 kg (233 lb 11 oz) IBW/kg (Calculated) : 75.26 HEPARIN DW (KG): 86.6  Vital Signs: Temp: 98.9 F (37.2 C) (04/03 1558) Temp Source: Axillary (04/03 1558) BP: 139/55 (04/03 1800) Pulse Rate: 68 (04/03 1800)  Labs: Recent Labs    08/11/23 0037 08/11/23 0453 08/11/23 0807 08/11/23 2304 08/12/23 0500 08/12/23 0757 08/13/23 0422 08/13/23 0801 08/13/23 0925 08/13/23 1738  HGB 7.0*  --    < > 8.0* 7.5*  --  7.6*  --   --   --   HCT 21.6*  --    < > 24.3* 22.7*  --  23.1*  --   --   --   PLT 57*  --   --  72* 73*  --  65*  --   --   --   LABPROT 17.5*  --   --   --   --   --   --   --   --   --   INR 1.4*  --   --   --   --   --   --   --   --   --   HEPARINUNFRC  --  0.21*   < > 0.17*  --    < > 0.21* 0.26*  --  0.30  CREATININE 1.33*  --   --   --  1.37*  --   --   --  1.56*  --   TROPONINIHS  --  39*  --   --   --   --   --   --   --   --    < > = values in this interval not displayed.    Estimated Creatinine Clearance: 51.5 mL/min (A) (by C-G formula based on SCr of 1.56 mg/dL (H)).  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 Patient was admitted 3/22 for progressive weakness. 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 Pharmacy consulted to dose heparin 3/30.  Heparin level remains subtherapeutic, trending up at 0.26 despite rate increases, cautious titration without boluses due to risk of bleeding. Hgb 7.6 - low stable. Platelets  65 - low stable. S/p 1 unit PRBC 315, Occult stool negative 4/1.   Heparin level therapeutic tonight at 0.3. We will increase slightly and check confirm in AM.   Goal of Therapy:  Heparin level 0.3-0.5 units/ml Monitor platelets by anticoagulation protocol: Yes   Plan:  Increase heparin gtt to 2400 units/hr F/u AM heparin level  Ulyses Southward, PharmD, [place], AAHIVP, CPP Infectious Disease Pharmacist 08/13/2023 6:51 PM

## 2023-08-13 NOTE — Progress Notes (Signed)
 STROKE TEAM PROGRESS NOTE   INTERIM HISTORY/SUBJECTIVE Wife, sister, brother, and CCM NP are at the bedside.  Patient lying in bed, intubated but eyes open, able to track bilaterally with voice, not quite follow commands, not spontaneous moving extremities.  Anasarca with worsening lactic acid and thrombocytopenia.  Stool guaiac is negative, resume heparin IV.  Continue antibiotic.  Cholecystic drainage patent.   OBJECTIVE  CBC    Component Value Date/Time   WBC 25.2 (H) 08/13/2023 0422   RBC 2.65 (L) 08/13/2023 0422   HGB 7.6 (L) 08/13/2023 0422   HGB 12.3 (L) 05/01/2023 1204   HCT 23.1 (L) 08/13/2023 0422   PLT 65 (L) 08/13/2023 0422   PLT 249 05/01/2023 1204   MCV 87.2 08/13/2023 0422   MCH 28.7 08/13/2023 0422   MCHC 32.9 08/13/2023 0422   RDW 16.6 (H) 08/13/2023 0422   LYMPHSABS 0.5 (L) 08/13/2023 0422   MONOABS 1.0 08/13/2023 0422   EOSABS 0.0 08/13/2023 0422   BASOSABS 0.0 08/13/2023 0422    BMET    Component Value Date/Time   NA 140 08/13/2023 0925   NA 142 04/06/2017 1057   K 3.5 08/13/2023 0925   CL 102 08/13/2023 0925   CO2 20 (L) 08/13/2023 0925   GLUCOSE 212 (H) 08/13/2023 0925   BUN 89 (H) 08/13/2023 0925   BUN 13 04/06/2017 1057   CREATININE 1.56 (H) 08/13/2023 0925   CREATININE 0.94 05/01/2023 1204   CALCIUM 8.0 (L) 08/13/2023 0925   GFRNONAA 46 (L) 08/13/2023 0925   GFRNONAA >60 05/01/2023 1204    IMAGING past 24 hours No results found.    Vitals:   08/13/23 1558 08/13/23 1600 08/13/23 1700 08/13/23 1800  BP:  (!) 127/52 (!) 131/56 (!) 139/55  Pulse:  67 69 68  Resp:  18 (!) 23 (!) 42  Temp: 98.9 F (37.2 C)     TempSrc: Axillary     SpO2:  99% 99% 100%  Weight:      Height:         PHYSICAL EXAM  Temp:  [98.5 F (36.9 C)-99.4 F (37.4 C)] 98.9 F (37.2 C) (04/03 1558) Pulse Rate:  [55-69] 68 (04/03 1800) Resp:  [16-42] 42 (04/03 1800) BP: (127-163)/(51-65) 139/55 (04/03 1800) SpO2:  [99 %-100 %] 100 % (04/03 1800) Arterial  Line BP: (111-142)/(40-49) 126/47 (04/03 1800) FiO2 (%):  [40 %] 40 % (04/03 1214) Weight:  [106 kg] 106 kg (04/03 0500)  General - Well nourished, well developed,  off sedation.  Ophthalmologic - fundi not visualized due to noncooperation.  Cardiovascular - Regular rate and rhythm.  Extremities - extensive peripheral edema  Neuro - intubated off sedation, eyes spontaneously open but still not following commands. Eyes in mid position but able to have lateral gaze towards sound, not blinking to visual threat, pupils equal size light reflexes sluggish. Corneal reflex weakly present, gag and cough present. Breathing over the vent.  Facial symmetry not able to test due to ET tube.  Tongue protrusion not cooperative. On pain stimulation, no movement in all extremities. Sensation, coordination and gait not tested.    ASSESSMENT/PLAN  [name] is a 73 y.o. male with history of prior history of cryptogenic strokes, diabetes, hyperlipidemia, hypertension, iron deficiency anemia, OSA, prostate cancer admitted for gradually progressive weakness and poor nutrition intake.    Stroke:  b/l small bilateral multifocal embolic infarcts, etiology: possible from primary hypercoagulable state CT head No acute abnormality.  MRI  Many small acute  infarcts in the bilateral frontal and parietal lobes, left thalamus, right occipital lobe, and cerebellum MRA iron [place] MRI repeat new small left pontine acute infarct.  Possible small left frontal cortex new infarct.  Stable subacute infarcts seen on the last MRI 2D Echo EF 60-65%.  LA size normal. CT chest abdomen pelvis no evidence of malignancy  Loop recorder interrogation negative for paroxysmal A-fib  LDL 47 HgbA1c 5.3 EEG diffuse slowing.  No seizure activity VTE prophylaxis - heparin subq aspirin 81 mg daily and Brilinta (ticagrelor) 90 mg bid prior to admission, now resumed heparin IV. Off ASA due to thrombocytopenia Therapy  recommendations: SNF Disposition: Pending, palliative care on board  Respiratory failure Aspiration Sepsis / septic shock Intubated for airway protection CCM on board Vent management per CCM On Zosyn and vancomycin Stat post percutaneous cholecystectomy with drainage Leukocytosis WBC 15.0->16.6->16.5->25.7-> 25.2 LA 4.8->6.0->6.6->8.0->8.2-> >9.0   Hx of Stroke/TIA 09/2022 admitted for bilateral ACA infarct, left more than right. CTA head and neck showed right A3 stenosis. EF 55 to 60%, no DVT. LDL 45, A1c 5.6. Discharged on DAPT and Lipitor 40 and Zetia. Loop recorder placed.  03/2023 admitted for small acute to subacute infarct on the left frontal and left parietal lobes, and right temporal white matter.  CTA head and neck similar diminutive distal right ACA.  EF 60 to 65%.  Loop recorder no A-fib.  LDL 47, A1c 8.2.  Discharged on aspirin and Brilinta for 30 days and then Plavix alone.  Also discharged on Lipitor and Zetia.  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 Dr. Candise Che saw pt again on 3/28 and again recommended anticoagulation Now heparin IV resumed  PAF Overnight Afib RVR On amiodarone IV Now converted back to NSR on heparin IV Cardiology on board  Cervical myelopathy  MRI C-spine  Severe canal stenosis at C3-C4. Cord T2 hyperintensity superior to this level, suspicious for edema and compressive myelopathy. Severe left and moderate right foraminal stenosis at this level. MRI T-spine Edema involving the left T1 inferior articular facet Neurosurgery consulted, recommend cervical surgery once medically stable.   Off aspirin 81 due to continued thrombocytopenia, has been off brilinta  Hx of hypertension Now hypotension with septic shock Home meds: Lotensin 40 mg [place]gro[place] 25 mg Stable on 3  pressors with Levophed, neo and vasopressin Long-term BP goal normotensive  Hyperlipidemia Home meds: Atorvastatin 40 mg and Zetia 10 mg LDL 47, goal < 70 Statin and zetia are on hold given acute liver injury Continue statin once LFTs normalize  Diabetes type II Controlled Home meds: Metformin, Toujeo HgbA1c 5.3, goal < 7.0 CBGs SSI Recommend close follow-up with PCP for better DM control  Acute liver injury AST/ALT 515/469--376/361--172/240--110/138--141/104--134/77--161/66 INR 1.4--1.3--1.4--1.4 Close monitoring Management per CCM  Anemia and thrombocytopenia Hemoglobin 8.9--8.5--8.1--7.8--8.4--7.5--6.5--PRBC--7.5--7.6 Platelet 97--91--89--71--64--57->73-> 65 Continue CBC monitoring closely  Other Stroke Risk Factors Obstructive sleep apnea, not on CPAP at home Advanced age  Other Active Problems BPH Depression, psychiatry on board B12 deficiency Cognitive impairment AKI, Cre 1.46--1.33--1.37--1.56   Hospital day # 12  This patient is critically ill due to embolic stroke, hypercoagulable state, respiratory failure, and at significant risk of neurological worsening, death form further stroke, hemorrhagic transformation, PE/DVT. This patient's care requires constant monitoring of vital signs, hemodynamics, respiratory and cardiac monitoring, review of multiple databases, neurological assessment, discussion with family, other specialists  and medical decision making of high complexity. I spent 45 minutes of neurocritical care time in the care of this patient.  I had long discussion with wife and sister at bedside, updated pt current condition, treatment plan and potential prognosis, and answered all the questions.  They expressed understanding and appreciation. I also discussed with Dr. Chestine Spore CCM.  Marvel Plan, MD PhD Stroke Neurology 08/13/2023 6:47 PM    To contact Stroke Continuity provider, please refer to [email or web address]. After hours, contact General Neurology

## 2023-08-13 NOTE — Progress Notes (Signed)
 RT NOTE: attempted SBT on patient on CPAP/PSV of 15/5 at 0815 however patient became apenic and backup ventilation alarmed.  Placed back on full support settings and is tolerating well at this time.  Will continue to monitor.

## 2023-08-13 NOTE — Inpatient Diabetes Management (Signed)
 Inpatient Diabetes Program Recommendations  AACE/ADA: New Consensus Statement on Inpatient Glycemic Control (2015)  Target Ranges:  Prepandial:   less than 140 mg/dL      Peak postprandial:   less than 180 mg/dL (1-2 hours)      Critically ill patients:  140 - 180 mg/dL   Lab Results  Component Value Date   GLUCAP 215 (H) 08/13/2023   HGBA1C 5.3 08/01/2023    Review of Glycemic Control  Latest Reference Range & Units 08/13/23 03:41 08/13/23 07:11 08/13/23 10:58  Glucose-Capillary 70 - 99 mg/dL 161 (H) 096 (H) 045 (H)  (H): Data is abnormally high  Current orders for Inpatient glycemic control: Semglee 35 every day, Novlog 0-15 units Q4H, solucortef 100 mg Q12H.  Osmolite at 50 ml/hr   Inpatient Diabetes Program Recommendations:    Might consider Novolog 2 units tube feed coverage Q4H.  Stop if feeds are held or discontinued.    Will continue to follow while inpatient.  Thank you, Dulce Sellar, MSN, CDCES Diabetes Coordinator Inpatient Diabetes Program [phone] (team pager from 8a-5p)

## 2023-08-13 NOTE — Progress Notes (Signed)
 I have discontinue Amiodarone for now as there is no recurrence of SVT and presence of abnormal LFT. If recurrence can re initiate meds.      Yates Decamp, MD, [facility] 08/13/2023, 6:50 AM [facility] [address] [place], [place] [address] Phone: [phone]. Fax:  [phone]

## 2023-08-14 ENCOUNTER — Inpatient Hospital Stay (HOSPITAL_COMMUNITY)

## 2023-08-14 DIAGNOSIS — D696 Thrombocytopenia, unspecified: Secondary | ICD-10-CM | POA: Diagnosis not present

## 2023-08-14 DIAGNOSIS — E1165 Type 2 diabetes mellitus with hyperglycemia: Secondary | ICD-10-CM

## 2023-08-14 DIAGNOSIS — D649 Anemia, unspecified: Secondary | ICD-10-CM | POA: Diagnosis not present

## 2023-08-14 DIAGNOSIS — J690 Pneumonitis due to inhalation of food and vomit: Secondary | ICD-10-CM | POA: Diagnosis not present

## 2023-08-14 DIAGNOSIS — D689 Coagulation defect, unspecified: Secondary | ICD-10-CM

## 2023-08-14 DIAGNOSIS — A419 Sepsis, unspecified organism: Secondary | ICD-10-CM | POA: Diagnosis not present

## 2023-08-14 DIAGNOSIS — D6859 Other primary thrombophilia: Secondary | ICD-10-CM | POA: Diagnosis not present

## 2023-08-14 DIAGNOSIS — E785 Hyperlipidemia, unspecified: Secondary | ICD-10-CM | POA: Diagnosis not present

## 2023-08-14 DIAGNOSIS — R6521 Severe sepsis with septic shock: Secondary | ICD-10-CM | POA: Diagnosis not present

## 2023-08-14 DIAGNOSIS — J9601 Acute respiratory failure with hypoxia: Secondary | ICD-10-CM | POA: Diagnosis not present

## 2023-08-14 DIAGNOSIS — I63443 Cerebral infarction due to embolism of bilateral cerebellar arteries: Secondary | ICD-10-CM | POA: Diagnosis not present

## 2023-08-14 LAB — VANCOMYCIN, RANDOM: Vancomycin Rm: 12 ug/mL

## 2023-08-14 LAB — CBC WITH DIFFERENTIAL/PLATELET
Abs Immature Granulocytes: 0 10*3/uL (ref 0.00–0.07)
Basophils Absolute: 0 10*3/uL (ref 0.0–0.1)
Basophils Relative: 0 %
Blasts: 1 %
Eosinophils Absolute: 0 10*3/uL (ref 0.0–0.5)
Eosinophils Relative: 0 %
HCT: 24 % — ABNORMAL LOW (ref 39.0–52.0)
Hemoglobin: 8 g/dL — ABNORMAL LOW (ref 13.0–17.0)
Lymphocytes Relative: 1 %
Lymphs Abs: 0.3 10*3/uL — ABNORMAL LOW (ref 0.7–4.0)
MCH: 28.8 pg (ref 26.0–34.0)
MCHC: 33.3 g/dL (ref 30.0–36.0)
MCV: 86.3 fL (ref 80.0–100.0)
Monocytes Absolute: 0.3 10*3/uL (ref 0.1–1.0)
Monocytes Relative: 1 %
Neutro Abs: 26 10*3/uL — ABNORMAL HIGH (ref 1.7–7.7)
Neutrophils Relative %: 97 %
Platelets: 34 10*3/uL — ABNORMAL LOW (ref 150–400)
RBC: 2.78 MIL/uL — ABNORMAL LOW (ref 4.22–5.81)
RDW: 16.3 % — ABNORMAL HIGH (ref 11.5–15.5)
WBC: 26.8 10*3/uL — ABNORMAL HIGH (ref 4.0–10.5)
nRBC: 1.1 % — ABNORMAL HIGH (ref 0.0–0.2)
nRBC: 3 /100{WBCs} — ABNORMAL HIGH

## 2023-08-14 LAB — DIC (DISSEMINATED INTRAVASCULAR COAGULATION)PANEL
D-Dimer, Quant: 3.71 ug{FEU}/mL — ABNORMAL HIGH (ref 0.00–0.50)
Fibrinogen: 156 mg/dL — ABNORMAL LOW (ref 210–475)
INR: 1.5 — ABNORMAL HIGH (ref 0.8–1.2)
Platelets: 27 10*3/uL — CL (ref 150–400)
Prothrombin Time: 18.6 s — ABNORMAL HIGH (ref 11.4–15.2)
Smear Review: NONE SEEN
aPTT: 69 s — ABNORMAL HIGH (ref 24–36)

## 2023-08-14 LAB — COMPREHENSIVE METABOLIC PANEL WITH GFR
ALT: 58 U/L — ABNORMAL HIGH (ref 0–44)
AST: 134 U/L — ABNORMAL HIGH (ref 15–41)
Albumin: 1.5 g/dL — ABNORMAL LOW (ref 3.5–5.0)
Alkaline Phosphatase: 148 U/L — ABNORMAL HIGH (ref 38–126)
Anion gap: 22 — ABNORMAL HIGH (ref 5–15)
BUN: 108 mg/dL — ABNORMAL HIGH (ref 8–23)
CO2: 18 mmol/L — ABNORMAL LOW (ref 22–32)
Calcium: 8.1 mg/dL — ABNORMAL LOW (ref 8.9–10.3)
Chloride: 99 mmol/L (ref 98–111)
Creatinine, Ser: 1.77 mg/dL — ABNORMAL HIGH (ref 0.61–1.24)
GFR, Estimated: 40 mL/min — ABNORMAL LOW (ref >60)
Glucose, Bld: 253 mg/dL — ABNORMAL HIGH (ref 70–99)
Potassium: 3.5 mmol/L (ref 3.5–5.1)
Sodium: 139 mmol/L (ref 135–145)
Total Bilirubin: 14.5 mg/dL — ABNORMAL HIGH (ref 0.0–1.2)
Total Protein: 3.4 g/dL — ABNORMAL LOW (ref 6.5–8.1)

## 2023-08-14 LAB — GLUCOSE, CAPILLARY
Glucose-Capillary: 210 mg/dL — ABNORMAL HIGH (ref 70–99)
Glucose-Capillary: 225 mg/dL — ABNORMAL HIGH (ref 70–99)
Glucose-Capillary: 225 mg/dL — ABNORMAL HIGH (ref 70–99)
Glucose-Capillary: 244 mg/dL — ABNORMAL HIGH (ref 70–99)
Glucose-Capillary: 248 mg/dL — ABNORMAL HIGH (ref 70–99)
Glucose-Capillary: 328 mg/dL — ABNORMAL HIGH (ref 70–99)

## 2023-08-14 LAB — HEPARIN LEVEL (UNFRACTIONATED): Heparin Unfractionated: 0.27 [IU]/mL — ABNORMAL LOW (ref 0.30–0.70)

## 2023-08-14 LAB — LACTIC ACID, PLASMA: Lactic Acid, Venous: >9 mmol/L (ref 0.5–1.9)

## 2023-08-14 LAB — APTT: aPTT: 80 s — ABNORMAL HIGH (ref 24–36)

## 2023-08-14 LAB — FLOW CYTOMETRY

## 2023-08-14 MED ORDER — SODIUM CHLORIDE 0.9% FLUSH: 3.0000 mL | INTRAVENOUS | Status: DC | PRN

## 2023-08-14 MED ORDER — INSULIN ASPART 100 UNIT/ML IJ SOLN
0.0000 [IU] | INTRAMUSCULAR | Status: DC
  Administered 2023-08-14: 15 [IU] via SUBCUTANEOUS
  Administered 2023-08-14 – 2023-08-15 (×4): 7 [IU] via SUBCUTANEOUS

## 2023-08-14 MED ORDER — AMIODARONE LOAD VIA INFUSION
150.0000 mg | Freq: Once | INTRAVENOUS | Status: AC
  Administered 2023-08-14: 150 mg via INTRAVENOUS
  Filled 2023-08-14: qty 83.34

## 2023-08-14 MED ORDER — VANCOMYCIN HCL 1500 MG/300ML IV SOLN
1500.0000 mg | INTRAVENOUS | Status: DC
  Administered 2023-08-14: 1500 mg via INTRAVENOUS
  Filled 2023-08-14: qty 300

## 2023-08-14 MED ORDER — SODIUM CHLORIDE 0.9% FLUSH
3.0000 mL | Freq: Two times a day (BID) | INTRAVENOUS | Status: DC
  Administered 2023-08-14: 3 mL via INTRAVENOUS

## 2023-08-14 MED ORDER — AMIODARONE HCL IN DEXTROSE 360-4.14 MG/200ML-% IV SOLN: 30.0000 mg/h | INTRAVENOUS | Status: DC

## 2023-08-14 MED ORDER — ARGATROBAN 50 MG/50ML IV SOLN
0.2000 ug/kg/min | INTRAVENOUS | Status: DC
  Administered 2023-08-14: 0.25 ug/kg/min via INTRAVENOUS
  Filled 2023-08-14: qty 50

## 2023-08-14 MED ORDER — SODIUM CHLORIDE 0.9 % IV SOLN: 250.0000 mL | INTRAVENOUS | Status: AC | PRN

## 2023-08-14 MED ORDER — AMIODARONE HCL IN DEXTROSE 360-4.14 MG/200ML-% IV SOLN
60.0000 mg/h | INTRAVENOUS | Status: AC
  Administered 2023-08-15: 60 mg/h via INTRAVENOUS
  Filled 2023-08-14 (×2): qty 200

## 2023-08-14 MED ORDER — VANCOMYCIN VARIABLE DOSE PER UNSTABLE RENAL FUNCTION (PHARMACIST DOSING): Status: DC

## 2023-08-14 MED ORDER — INSULIN ASPART 100 UNIT/ML IJ SOLN
4.0000 [IU] | INTRAMUSCULAR | Status: DC
  Administered 2023-08-14 – 2023-08-15 (×5): 4 [IU] via SUBCUTANEOUS

## 2023-08-14 NOTE — Progress Notes (Signed)
 PEEP and FIO2 increased due to desaturation to 87% and HR unstable. Will wean as tolerated. No respiratory distress noted at this time.    08/14/23 2045  Adult Ventilator Settings  FiO2 (%) (S)  100 %  PEEP (S)  8 cmH20

## 2023-08-14 NOTE — Progress Notes (Addendum)
 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 Was on Plavix, then aspirin and Brilinta. - No previous unprovoked DVT or PE in the past. - Low protein S levels.  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  Combination of findings would increase the risk of VTE and arterial thrombosis. - MRI brain done 08/04/2023 showed many small acute infarcts, multiple vascular territories, consider embolic etiology. - Isolated thrombophilias (factor V Leiden, prothrombin mutation, protein S deficiency) are primarily associated with venous clots. - Combined thrombophilic states can significantly increase the risk of thrombosis overall. While arterial thrombosis isn't as clearly linked as venous events, the compounded hypercoagulability might increase the chance of arterial events (like strokes), particularly if other risk factors or conditions especially if there are significant other CV risk factors.  - There is no way to determine the final effects of the presence of 3 separate hypercoagulable states together. in this situation if the patient were to have an arterial thrombosis outside of the CNS -- we would strongly push for therapeutic anticoagulation but in this case based on mechanism of CVA would defer that choice to neurology. -Concern for HIT, DIC panel done - Patient remains intubated and lethargic. - Hematology/Dr. Candise Che following   Lesion, spleen - CT scan done 08/06/2023 shows enlarging 3.4 cm complex lesion upper pole of spleen, considerations for hematoma, low-grade lymphoma or pseudotumor. - Flow cytometry ordered to evaluate further on 08/07/2023-and showed no evidence of monoclonal B or T lymphocytes and no significant CD34 positive blastic population. - Plan pet/CT scan as outpatient   Myelopathy, cervical  spine - MR cervical spine shows severe canal stenosis C3-C4.  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 -Plan for C-spine surgery 08/10/2023   Anemia, normocytic Thrombocytopenia Multifactorial -- significant B12 deficiency. Aspiration pneumonia, possible GI losses while on DAPT, concern for HIT. Patient also noted to have significant coagulopathy in this context likely due to progressive liver failure and compensated DIC. Patient also appears to be septic with progressive elevation of procalcitonin levels to 9.75.  Remains intubated and on pressors. Patient has also had progressive worsening of liver functions without an absolutely clear etiology.  Has a biliary drain. -Hemoglobin low 8.0 today -Platelets continue to drop, 34K today and then 27k - No transfusional intervention required at this time - Continue to monitor CBC with differential --Transfuse PRBC for hemoglobin less than or equal to 7 and for platelets less than 20k in the context of sepsis.  Diabetes Hypertension Hyperlipidemia - Continue to monitor blood pressure - Continue to monitor blood sugar levels   Hard of hearing - Since birth   Code Status DNR-INTERVEN  Subjective:  Patient is intubated and sedated.  Very ill-appearing.  No bleeding is noted.  No acute distress is noted.  Family members at bedside.  Objective:  Vitals:   08/14/23 1101 08/14/23 1147  BP:    Pulse:    Resp:    Temp:  99.4 F (37.4 C)  SpO2: 98%      Intake/Output Summary (Last 24 hours) at 08/14/2023 1333 Last data filed at 08/14/2023 1100 Gross per 24 hour  Intake 3171.55 ml  Output 1585 ml  Net 1586.55 ml     REVIEW OF SYSTEMS: Unable to obtain  PHYSICAL EXAMINATION: ECOG PERFORMANCE STATUS: 4 - Bedbound  Vitals:   08/14/23  1101 08/14/23 1147  BP:    Pulse:    Resp:    Temp:  99.4 F (37.4 C)  SpO2: 98%    Filed Weights   08/10/23 0500 08/11/23 0444 08/13/23 0500  Weight: 225 lb 5 oz (102.2 kg) 227  lb 1.2 oz (103 kg) 233 lb 11 oz (106 kg)    GENERAL: + Intubated and sedated + ill-appearing SKIN: + Jaundiced skin color, texture, turgor are normal, no rashes or significant lesions EYES: normal, conjunctiva are pink and non-injected, sclera clear OROPHARYNX: + Intubated NECK: supple, thyroid normal size, non-tender, without nodularity LYMPH: no palpable lymphadenopathy in the cervical, axillary or inguinal LUNGS: clear to auscultation and percussion with normal breathing effort HEART: regular rate & rhythm and no murmurs and no lower extremity edema ABDOMEN: abdomen soft, non-tender and normal bowel sounds MUSCULOSKELETAL: no cyanosis of digits and no clubbing  PSYCH: + Intubated NEURO: no focal motor/sensory deficits   All questions were answered. The patient knows to call the clinic with any problems, questions or concerns.   The total time spent in the appointment was 40 minutes encounter with patient including review of chart and various tests results, discussions about plan of care and coordination of care plan  Dawson Bills, NP 08/14/2023 1:33 PM    Labs Reviewed:  Lab Results  Component Value Date   WBC 26.8 (H) 08/14/2023   HGB 8.0 (L) 08/14/2023   HCT 24.0 (L) 08/14/2023   MCV 86.3 08/14/2023   PLT 27 (LL) 08/14/2023   Recent Labs    04/07/23 0458 04/11/23 1113 08/11/23 0453 08/11/23 0807 08/12/23 0500 08/13/23 0925 08/14/23 0409  NA 138   < >  --    < > 137 140 139  K 3.7   < >  --    < > 3.8 3.5 3.5  CL 101   < >  --   --  105 102 99  CO2 28   < >  --   --  16* 20* 18*  GLUCOSE 108*   < >  --   --  170* 212* 253*  BUN 12   < >  --   --  68* 89* 108*  CREATININE 1.08   < >  --   --  1.37* 1.56* 1.77*  CALCIUM 9.3   < >  --   --  7.7* 8.0* 8.1*  GFRNONAA >60   < >  --   --  54* 46* 40*  PROT 5.9*   < > 3.3*  --  3.9* 3.5* 3.4*  ALBUMIN 3.1*   < > <1.5*  --  2.2* 1.7* 1.5*  AST 17   < > 141*  --  134* 161* 134*  ALT 16   < > 104*  --  77* 66* 58*   ALKPHOS 38   < > 141*  --  143* 140* 148*  BILITOT 1.2*   < > 5.1*  --  6.2* 10.7* 14.5*  BILIDIR 0.2  --  3.3*  --   --   --   --   IBILI 1.0*  --  1.8*  --   --   --   --    < > = values in this interval not displayed.    Studies Reviewed:  IR Perc Cholecystostomy Result Date: 08/12/2023 INDICATION: 73 year old male with sepsis referred for percutaneous cholecystostomy EXAM: CHOLECYSTOSTOMY MEDICATIONS: None ANESTHESIA/SEDATION: Moderate (conscious) sedation was not employed during this procedure. The patient  is intubated with fentanyl drip FLUOROSCOPY TIME:  Fluoroscopy Time:   (2.5 mGy). COMPLICATIONS: None PROCEDURE: Informed written consent was obtained from the patient's family after a thorough discussion of the procedural risks, benefits and alternatives. All questions were addressed. Maximal Sterile Barrier Technique was utilized including caps, mask, sterile gowns, sterile gloves, sterile drape, hand hygiene and skin antiseptic. A timeout was performed prior to the initiation of the procedure. Ultrasound survey of the right upper quadrant was performed for planning purposes. Once the patient is prepped and draped in the usual sterile fashion, the skin and subcutaneous tissues overlying the gallbladder were generously infiltrated 1% lidocaine for local anesthesia. A coaxial needle was advanced under ultrasound guidance through the skin subcutaneous tissues and a small segment of liver into the gallbladder lumen. With removal of the stylet, spontaneous dark bile drainage occurred. Using modified Seldinger technique, a 10 French drain was placed into the gallbladder fossa, with aspiration of the sample for the lab. Contrast injection confirmed position of the tube within the gallbladder lumen. Drainage catheter was attached to gravity drain with a suture retention placed. Patient tolerated the procedure well and remained hemodynamically stable throughout. No complications were encountered and no  significant blood loss encountered. IMPRESSION: Status post image guided percutaneous cholecystostomy Signed, Yvone Neu. Miachel Roux, RPVI [facility] Electronically Signed   By: Gilmer Mor D.O.   On: 08/12/2023 14:14   CT HEAD WO CONTRAST ( ) Result Date: 08/11/2023 CLINICAL DATA:  Encephalopathy EXAM: CT HEAD WITHOUT CONTRAST TECHNIQUE: Contiguous axial images were obtained from the base of the skull through the vertex without intravenous contrast. RADIATION DOSE REDUCTION: This exam was performed according to the departmental dose-optimization program which includes automated exposure control, adjustment of the mA and/or kV according to patient size and/or use of iterative reconstruction technique. COMPARISON:  08/04/2023 FINDINGS: Brain: Diffuse cerebral atrophy. No acute intracranial abnormality. Specifically, no hemorrhage, hydrocephalus, mass lesion, acute infarction, or significant intracranial injury. Vascular: No hyperdense vessel or unexpected calcification. Skull: No acute calvarial abnormality. Sinuses/Orbits: No acute findings Other: None IMPRESSION: Atrophy.  No acute intracranial abnormality. Electronically Signed   By: Charlett Nose M.D.   On: 08/11/2023 19:31   CT ABDOMEN PELVIS W CONTRAST Result Date: 08/11/2023 CLINICAL DATA:  Sepsis EXAM: CT ABDOMEN AND PELVIS WITH CONTRAST TECHNIQUE: Multidetector CT imaging of the abdomen and pelvis was performed using the standard protocol following bolus administration of intravenous contrast. RADIATION DOSE REDUCTION: This exam was performed according to the departmental dose-optimization program which includes automated exposure control, adjustment of the mA and/or kV according to patient size and/or use of iterative reconstruction technique. CONTRAST:  75mL OMNIPAQUE IOHEXOL 350 MG/ML SOLN COMPARISON:  08/07/2023 FINDINGS: Lower chest: See chest CT report from today. Hepatobiliary:  Small layering stones within the gallbladder. No focal hepatic abnormality or biliary ductal dilatation. Pancreas: No focal abnormality or ductal dilatation. Spleen: 3.1 cm low-density lesion in the superior aspect of the spleen, unchanged. Normal size. Adrenals/Urinary Tract: Bilateral adrenal nodules again noted, unchanged. Mild left hydronephrosis is unchanged. Left ureter is decompressed. Findings compatible with chronic UPJ obstruction. No hydronephrosis on the right. Right upper pole renal cyst is stable. No follow-up imaging recommended. Urinary bladder decompressed with Foley catheter in place. Stomach/Bowel: Normal appendix. Scattered colonic diverticulosis. No active diverticulitis. Stomach and small bowel decompressed. NG tube tip is in the proximal duodenum. Vascular/Lymphatic: Aortic atherosclerosis. No evidence of aneurysm or adenopathy. Reproductive: Mild prostate hypertrophy. Surgical clips or radiation  seeds adjacent to the prostate. Other: Small amount of free fluid in the pelvis. Diffuse edema throughout the subcutaneous soft tissues compatible with anasarca, worsening since prior study. Bilateral inguinal hernias containing fat. Musculoskeletal: No acute bony abnormality. IMPRESSION: No acute findings in the abdomen or pelvis. Stable chronic UPJ obstruction. Cholelithiasis. Stable low-density lesion in the superior aspect of the spleen with differential remaining the same. This could be further evaluated with non emergent MRI. Small amount of free fluid in the pelvis. Aortoiliac atherosclerosis. Diffuse anasarca throughout the subcutaneous soft tissues. Electronically Signed   By: Charlett Nose M.D.   On: 08/11/2023 19:30   CT Angio Chest Pulmonary Embolism (PE) W or WO Contrast Result Date: 08/11/2023 CLINICAL DATA:  Pulmonary embolism (PE) suspected, low to intermediate prob, positive D-dimer. Hypoxia EXAM: CT ANGIOGRAPHY CHEST WITH CONTRAST TECHNIQUE: Multidetector CT imaging of the chest was  performed using the standard protocol during bolus administration of intravenous contrast. Multiplanar CT image reconstructions and MIPs were obtained to evaluate the vascular anatomy. RADIATION DOSE REDUCTION: This exam was performed according to the departmental dose-optimization program which includes automated exposure control, adjustment of the mA and/or kV according to patient size and/or use of iterative reconstruction technique. CONTRAST:  75mL OMNIPAQUE IOHEXOL 350 MG/ML SOLN COMPARISON:  08/06/2023 FINDINGS: Cardiovascular: No filling defects in the pulmonary arteries to suggest pulmonary emboli. Heart is normal size. Aorta is normal caliber. Scattered coronary artery and aortic atherosclerosis. Mediastinum/Nodes: No mediastinal, hilar, or axillary adenopathy. Trachea and esophagus are unremarkable. Thyroid unremarkable. Endotracheal tube tip in the midtrachea. Lungs/Pleura: Small right pleural effusion with pleural thickening. Small left pleural effusion, new since prior study. Dependent atelectasis in the lower lobes. Scarring or atelectasis in the right middle lobe. Upper Abdomen: No acute findings. Musculoskeletal: Chest wall soft tissues are unremarkable. No acute bony abnormality. Review of the MIP images confirms the above findings. IMPRESSION: No evidence of pulmonary embolus. Small bilateral pleural effusions, new on the left since prior study. Associated right pleural thickening. Bibasilar atelectasis. Right middle lobe atelectasis or scarring, similar to prior study. Coronary artery disease. Aortic Atherosclerosis (ICD10-I70.0). Electronically Signed   By: Charlett Nose M.D.   On: 08/11/2023 19:24   NM Hepatobiliary Liver Func Result Date: 08/11/2023 CLINICAL DATA:  Elevated bilirubin. Concern for biliary obstruction. EXAM: NUCLEAR MEDICINE HEPATOBILIARY IMAGING TECHNIQUE: Sequential images of the abdomen were obtained out to 60 minutes following intravenous administration of  radiopharmaceutical. RADIOPHARMACEUTICALS:  5.1 mCi Tc-84m  Choletec IV COMPARISON:  Ultrasound 08/10/2023, CT 08/07/2023 FINDINGS: Delayed clearance of radiotracer from blood pool related to hyperbilirubinemia. Uniform uptake within the liver. No counts are excreted into the extrahepatic bile ducts over the course of 2 hour imaging. IMPRESSION: No radiotracer excreted into the bile ducts over 2 hours of imaging. Differential includes biliary obstruction versus hepatocellular disease such as hepatitis. Recommend clinical correlation. Electronically Signed   By: Genevive Bi M.D.   On: 08/11/2023 16:45   ECHOCARDIOGRAM LIMITED Result Date: 08/11/2023    ECHOCARDIOGRAM LIMITED REPORT   Patient Name:   JASIM HARARI Date of Exam: 08/11/2023 Medical Rec #:  [id number]        Height:       71.0 in Accession #:    [id number]       Weight:       227.1 lb Date of Birth:  02-Mar-1951        BSA:          2.225 m Patient Age:    73  years         BP:           143/54 mmHg Patient Gender: M                HR:           56 bpm. Exam Location:  Inpatient Procedure: Limited Echo, Color Doppler and Cardiac Doppler (Both Spectral and            Color Flow Doppler were utilized during procedure). Indications:    Shock  History:        Patient has prior history of Echocardiogram examinations.                 Stroke, Signs/Symptoms:Syncope; Risk Factors:Diabetes, Former                 Smoker and Sleep Apnea.  Sonographer:    Lamont Snowball Referring Phys: [id number] Karl Ito IMPRESSIONS  1. Left ventricular ejection fraction, by estimation, is 55 to 60%. The left ventricle has normal function. The left ventricle has no regional wall motion abnormalities.  2. Mildly D-shaped septum suggests a degree of RV pressure/volume overload. Right ventricular systolic function is mildly reduced. The right ventricular size is mildly enlarged. Tricuspid regurgitation signal is inadequate for assessing PA pressure.  3. The mitral valve is  normal in structure. Trivial mitral valve regurgitation. No evidence of mitral stenosis.  4. The aortic valve is tricuspid. There is moderate calcification of the aortic valve. Aortic valve regurgitation is not visualized. Aortic valve sclerosis/calcification is present, without any evidence of aortic stenosis. Aortic valve mean gradient measures 8.0 mmHg.  5. The inferior vena cava is dilated in size with <50% respiratory variability, suggesting right atrial pressure of 15 mmHg.  6. Limited echo FINDINGS  Left Ventricle: Left ventricular ejection fraction, by estimation, is 55 to 60%. The left ventricle has normal function. The left ventricle has no regional wall motion abnormalities. The left ventricular internal cavity size was normal in size. There is  no left ventricular hypertrophy. Right Ventricle: Mildly D-shaped septum suggests a degree of RV pressure/volume overload. The right ventricular size is mildly enlarged. No increase in right ventricular wall thickness. Right ventricular systolic function is mildly reduced. Tricuspid regurgitation signal is inadequate for assessing PA pressure. Left Atrium: Left atrial size was normal in size. Right Atrium: Right atrial size was normal in size. Pericardium: There is no evidence of pericardial effusion. Mitral Valve: The mitral valve is normal in structure. Mild mitral annular calcification. Trivial mitral valve regurgitation. No evidence of mitral valve stenosis. Tricuspid Valve: The tricuspid valve is normal in structure. Tricuspid valve regurgitation is mild. Aortic Valve: The aortic valve is tricuspid. There is moderate calcification of the aortic valve. Aortic valve regurgitation is not visualized. Aortic valve sclerosis/calcification is present, without any evidence of aortic stenosis. Aortic valve mean gradient measures 8.0 mmHg. Aortic valve peak gradient measures 15.4 mmHg. Aortic valve area, by VTI measures 1.57 cm. Aorta: The aortic root is normal in  size and structure. Venous: The inferior vena cava is dilated in size with less than 50% respiratory variability, suggesting right atrial pressure of 15 mmHg. LEFT VENTRICLE PLAX 2D LVIDd:         5.00 cm LVIDs:         3.30 cm LV PW:         0.80 cm LV IVS:        0.80 cm LVOT diam:     1.90  cm LV SV:         66 LV SV Index:   30 LVOT Area:     2.84 cm  IVC IVC diam: 2.50 cm AORTIC VALVE AV Area (Vmax):    1.50 cm AV Area (Vmean):   1.57 cm AV Area (VTI):     1.57 cm AV Vmax:           196.00 cm/s AV Vmean:          129.000 cm/s AV VTI:            0.420 m AV Peak Grad:      15.4 mmHg AV Mean Grad:      8.0 mmHg LVOT Vmax:         104.00 cm/s LVOT Vmean:        71.600 cm/s LVOT VTI:          0.232 m LVOT/AV VTI ratio: 0.55  AORTA Ao Root diam: 2.90 cm  SHUNTS Systemic VTI:  0.23 m Systemic Diam: 1.90 cm Dalton McleanMD Electronically signed by Wilfred Lacy Signature Date/Time: 08/11/2023/9:33:29 AM    Final    DG CHEST PORT 1 VIEW Result Date: 08/10/2023 CLINICAL DATA:  Hypoxia EXAM: PORTABLE CHEST 1 VIEW COMPARISON:  X-ray 08/08/2023 and older FINDINGS: Stable ET tube, enteric tube. Placement of a left-sided PICC with tip overlying the central SVC above the right atrium. Presumed loop recorder along the lower left hemithorax. Stable cardiopericardial silhouette. No pneumothorax. Slight increase in small right pleural effusion. Mild adjacent opacity is again noted. No edema. Prominence of the central vasculature. Overlapping cardiac leads. IMPRESSION: New left-sided PICC with tip along the central SVC above the right atrium. Slight increase in tiny right pleural effusion. Electronically Signed   By: Karen Kays M.D.   On: 08/10/2023 16:00   US Abdomen Limited RUQ (LIVER/GB) Result Date: 08/10/2023 CLINICAL DATA:  Hyperbilirubinemia. EXAM: ULTRASOUND ABDOMEN LIMITED RIGHT UPPER QUADRANT COMPARISON:  08/06/2023 FINDINGS: Gallbladder: Dilated gallbladder with some borderline wall thickening of 3 mm. Several  internal echoes consistent with sludge. No shadowing stones. Patient is on a ventilator. Common bile duct: Diameter: 6 mm Liver: No focal lesion identified. Within normal limits in parenchymal echogenicity. Portal vein is patent on color Doppler imaging with normal direction of blood flow towards the liver. Other: Trace ascites. IMPRESSION: Distended gallbladder with borderline wall thickening and sludge. No obvious stones or ductal dilatation. Trace ascites. Electronically Signed   By: Karen Kays M.D.   On: 08/10/2023 15:59   VAS [place] LOWER EXTREMITY VENOUS (DVT) Result Date: 08/09/2023  Lower Venous DVT Study Patient Name:  AUM CAGGIANO  Date of Exam:   08/09/2023 Medical Rec #: [id number]         Accession #:    [id number] Date of Birth: June 30, 1950         Patient Gender: M Patient Age:   6 years Exam Location:  [facility] Procedure:      VAS [place] LOWER EXTREMITY VENOUS (DVT) Referring Phys: Scheryl Marten XU --------------------------------------------------------------------------------  Indications: Embolic stroke, prostate cancer, weakness. Other Indications: Factor V Leiden and prothrombin gene mutation. Comparison Study: Previous study on 5.30.2024. Performing Technologist: Fernande Bras  Examination Guidelines: A complete evaluation includes B-mode imaging, spectral Doppler, color Doppler, and power Doppler as needed of all accessible portions of each vessel. Bilateral testing is considered an integral part of a complete examination. Limited examinations for reoccurring indications may be performed as noted. The reflux portion of the exam  is performed with the patient in reverse Trendelenburg.  +---------+---------------+---------+-----------+----------+-------------------+ RIGHT    CompressibilityPhasicitySpontaneityPropertiesThrombus Aging      +---------+---------------+---------+-----------+----------+-------------------+ CFV      Full           Yes      Yes                                       +---------+---------------+---------+-----------+----------+-------------------+ SFJ      Full           Yes      Yes                                      +---------+---------------+---------+-----------+----------+-------------------+ FV Prox  Full                                                             +---------+---------------+---------+-----------+----------+-------------------+ FV Mid   Full                                                             +---------+---------------+---------+-----------+----------+-------------------+ FV DistalFull                                                             +---------+---------------+---------+-----------+----------+-------------------+ PFV      Full                                                             +---------+---------------+---------+-----------+----------+-------------------+ POP      Full           Yes      Yes                                      +---------+---------------+---------+-----------+----------+-------------------+ PTV                                                   Not well                                                                  visualized, patent  by color.           +---------+---------------+---------+-----------+----------+-------------------+ PERO                                                  Not well                                                                  visualized, patent                                                        by color.           +---------+---------------+---------+-----------+----------+-------------------+ Gastroc  Partial        No       No                                       +---------+---------------+---------+-----------+----------+-------------------+    +---------+---------------+---------+-----------+----------+--------------+ LEFT     CompressibilityPhasicitySpontaneityPropertiesThrombus Aging +---------+---------------+---------+-----------+----------+--------------+ CFV      Full           Yes      Yes                                 +---------+---------------+---------+-----------+----------+--------------+ SFJ      Full           Yes      Yes                                 +---------+---------------+---------+-----------+----------+--------------+ FV Prox  Full                                                        +---------+---------------+---------+-----------+----------+--------------+ FV Mid   Full                                                        +---------+---------------+---------+-----------+----------+--------------+ FV DistalFull                                                        +---------+---------------+---------+-----------+----------+--------------+ PFV      Full                                                        +---------+---------------+---------+-----------+----------+--------------+  POP      Full           Yes      Yes                                 +---------+---------------+---------+-----------+----------+--------------+ PTV      Full                                                        +---------+---------------+---------+-----------+----------+--------------+ PERO     Full                                                        +---------+---------------+---------+-----------+----------+--------------+     Summary: RIGHT: 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 - No cystic structure found in the popliteal fossa.  LEFT: - There is no evidence of deep vein thrombosis in the lower extremity.  - No cystic structure found in the popliteal fossa.  *See table(s) above for measurements and  observations. Electronically signed by Carolynn Sayers on 08/09/2023 at 1:09:22 PM.    Final    MR BRAIN WO CONTRAST Result Date: 08/08/2023 CLINICAL DATA:  altered mental status, concerned for recurrent strokes; Stroke, follow up. EXAM: MRI HEAD WITHOUT CONTRAST MRA HEAD WITHOUT CONTRAST TECHNIQUE: Multiplanar, multi-echo pulse sequences of the brain and surrounding structures were acquired without intravenous contrast. Angiographic images of the Circle of Willis were acquired using MRA technique without intravenous contrast. COMPARISON:  Head MRI 08/04/2023.  Head and neck CTA 04/06/2023. FINDINGS: MRI HEAD FINDINGS Brain: A 5 mm acute infarct in the left paracentral pons is new from the prior MRI. Numerous acute to subacute infarcts are again seen involving cortex and white matter in both cerebral hemispheres as well as the dorsal left thalamus and cerebellum. A few of these may be minimally larger or more conspicuous than on the prior MRI, however no sizable new supratentorial infarct is identified. There is up to mild associated cytotoxic edema without mass effect. No intracranial hemorrhage, mass, midline shift, or extra-axial fluid collection is identified. There is mild cerebral atrophy. Vascular: Major intracranial vascular flow voids are preserved. Skull and upper cervical spine: Unremarkable bone marrow signal. Sinuses/Orbits: Unremarkable orbits. No significant inflammatory disease in the paranasal sinuses. Clear mastoid air cells. Other: None. MRA HEAD FINDINGS Anterior circulation: The internal carotid arteries are widely patent from skull base to carotid termini. ACAs and MCAs are patent without evidence of a proximal branch occlusion or significant proximal stenosis. No aneurysm is identified. Posterior circulation: The included portions of the intracranial vertebral arteries are patent to the basilar. Patent PICA and SCA origins are visualized bilaterally. The basilar artery is widely patent.  Posterior communicating arteries are diminutive or absent. Both PCAs are patent without evidence of a significant proximal stenosis. No aneurysm is identified. Anatomic variants: Aplastic right A1 segment. IMPRESSION: 1. 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 2. 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 3. Negative head MRA. Electronically Signed   By: Jolaine Click.D.  On: 08/08/2023 16:38   MR ANGIO HEAD WO CONTRAST Result Date: 08/08/2023 CLINICAL DATA:  altered mental status, concerned for recurrent strokes; Stroke, follow up. EXAM: MRI HEAD WITHOUT CONTRAST MRA HEAD WITHOUT CONTRAST TECHNIQUE: Multiplanar, multi-echo pulse sequences of the brain and surrounding structures were acquired without intravenous contrast. Angiographic images of the Circle of Willis were acquired using MRA technique without intravenous contrast. COMPARISON:  Head MRI 08/04/2023.  Head and neck CTA 04/06/2023. FINDINGS: MRI HEAD FINDINGS Brain: A 5 mm acute infarct in the left paracentral pons is new from the prior MRI. Numerous acute to subacute infarcts are again seen involving cortex and white matter in both cerebral hemispheres as well as the dorsal left thalamus and cerebellum. A few of these may be minimally larger or more conspicuous than on the prior MRI, however no sizable new supratentorial infarct is identified. There is up to mild associated cytotoxic edema without mass effect. No intracranial hemorrhage, mass, midline shift, or extra-axial fluid collection is identified. There is mild cerebral atrophy. Vascular: Major intracranial vascular flow voids are preserved. Skull and upper cervical spine: Unremarkable bone marrow signal. Sinuses/Orbits: Unremarkable orbits. No significant inflammatory disease in the paranasal sinuses. Clear mastoid air cells. Other: None. MRA HEAD FINDINGS Anterior circulation: The internal carotid arteries are widely patent  from skull base to carotid termini. ACAs and MCAs are patent without evidence of a proximal branch occlusion or significant proximal stenosis. No aneurysm is identified. Posterior circulation: The included portions of the intracranial vertebral arteries are patent to the basilar. Patent PICA and SCA origins are visualized bilaterally. The basilar artery is widely patent. Posterior communicating arteries are diminutive or absent. Both PCAs are patent without evidence of a significant proximal stenosis. No aneurysm is identified. Anatomic variants: Aplastic right A1 segment. IMPRESSION: 1. 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 2. 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 3. Negative head MRA. Electronically Signed   By: Sebastian Ache M.D.   On: 08/08/2023 16:38   US LIVER DOPPLER Result Date: 08/08/2023 CLINICAL DATA:  Elevated LFTs. EXAM: DUPLEX ULTRASOUND OF LIVER TECHNIQUE: Color and duplex Doppler ultrasound was performed to evaluate the hepatic in-flow and out-flow vessels. COMPARISON:  CT a abdomen and pelvis 08/07/2023 FINDINGS: Liver: Liver echogenicity is within normal limits. Trace perihepatic ascites. Liver contour is within normal limits. No discrete liver lesion. Main Portal Vein size: 1.1 cm Portal Vein Velocities Main Prox:  42 cm/sec Main Mid: 65 cm/sec Main Dist:  55 cm/sec Right: 23 cm/sec Left: 14 cm/sec Hepatic Vein Velocities Right:  39 cm/sec Middle:  38 cm/sec Left:  43 cm/sec IVC: Patent. Hepatic Artery Velocity:  228 cm/sec Splenic Vein Velocity:  52 cm/sec Spleen: 17.0 cm x 5.2 cm x 5.8 cm with a total volume of 267 cm^3 (411 cm^3 is upper limit normal) Portal Vein Occlusion/Thrombus: No Splenic Vein Occlusion/Thrombus: No Ascites: Present.  Trace perihepatic ascites. Varices: None Normal hepatopetal flow in the portal veins. Normal hepatofugal flow in the hepatic veins. IMPRESSION: 1. Portal venous system is patent with  normal direction of flow. 2. Perihepatic ascites. Electronically Signed   By: Richarda Overlie M.D.   On: 08/08/2023 14:56   DG Abd 1 View Result Date: 08/08/2023 CLINICAL DATA:  Orogastric tube placement. EXAM: ABDOMEN - 1 VIEW COMPARISON:  None Available. FINDINGS: The bowel gas pattern is normal. Distal tip of nasogastric tube is seen in expected position of proximal stomach. No radio-opaque calculi  or other significant radiographic abnormality are seen. IMPRESSION: Distal tip of nasogastric tube is seen in expected position of proximal stomach. Electronically Signed   By: Lupita Raider M.D.   On: 08/08/2023 11:51   DG Chest Port 1 View Result Date: 08/08/2023 CLINICAL DATA:  Intubation. EXAM: PORTABLE CHEST 1 VIEW COMPARISON:  Same day. FINDINGS: Endotracheal and nasogastric tubes appear to be in grossly good position. Stable right-sided pulmonary findings compared to prior exam. IMPRESSION: Endotracheal and nasogastric tubes appear to be in grossly good position. Electronically Signed   By: Lupita Raider M.D.   On: 08/08/2023 11:50   [place] EKG SITE RITE Result Date: 08/08/2023 If Site Rite image not attached, placement could not be confirmed due to current cardiac rhythm.  DG Chest Port 1 View Result Date: 08/08/2023 CLINICAL DATA:  Shortness of breath. EXAM: PORTABLE CHEST 1 VIEW COMPARISON:  08/07/2023 FINDINGS: Stable cardiomediastinal contours. Loop recorder, unchanged. Persistent right pleural effusion with overlying pleuroparenchymal scarring. Left lung appears clear. No new findings. The visualized osseous structures appear intact. Postoperative changes noted in the cervical spine. IMPRESSION: Persistent right pleural effusion with overlying pleuroparenchymal scarring. Electronically Signed   By: Signa Kell M.D.   On: 08/08/2023 07:56   CT Angio Abd/Pel w/ and/or w/o Result Date: 08/07/2023 CLINICAL DATA:  Hepatic insufficiency EXAM: CT ANGIOGRAPHY ABDOMEN AND PELVIS WITH CONTRAST AND  WITHOUT CONTRAST TECHNIQUE: Multidetector CT imaging of the abdomen and pelvis was performed using the liver protocol during bolus administration of intravenous contrast. Because of an IV malfunction, a repeat bolus was required to obtain arterial phase images. Multiplanar reconstructed images and MIPs were obtained and reviewed to evaluate the vascular anatomy. RADIATION DOSE REDUCTION: This exam was performed according to the departmental dose-optimization program which includes automated exposure control, adjustment of the mA and/or kV according to patient size and/or use of iterative reconstruction technique. CONTRAST:  OMNIPAQUE IOHEXOL 350 MG/ML SOLN COMPARISON:  None Available. FINDINGS: VASCULAR Aorta: Normal caliber aorta without aneurysm, dissection, vasculitis or significant stenosis. Moderate atherosclerotic calcification Celiac: Common origin of the celiac axis and superior mesenteric artery. Wide patency. No aneurysm or dissection. SMA: Common origin of the superior mesenteric artery and celiac axis. Wide patency. No aneurysm or dissection. 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 Right renal artery is widely patent. Normal vascular morphology. No aneurysm or dissection. Accessory lower pole renal arteries noted bilaterally. IMA: Patent without evidence of aneurysm, dissection, vasculitis or significant stenosis. Inflow: Patent without evidence of aneurysm, dissection, vasculitis or significant stenosis. Proximal Outflow: Bilateral common femoral and visualized portions of the superficial and profunda femoral arteries are patent without evidence of aneurysm, dissection, vasculitis or significant stenosis. Veins: Unremarkable.  Portal and hepatic veins are patent. Review of the MIP images confirms the above findings. 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 Mild cardiomegaly.  Hepatobiliary: Cholelithiasis without superimposed pericholecystic inflammatory change. Liver unremarkable; no enhancing intrahepatic mass identified. No intra or extrahepatic biliary ductal dilation. Pancreas: Unremarkable Spleen: The previously noted hypodense lesion within the upper pole of the spleen demonstrates progressive enhancement on delayed phase images but remains hypoenhancing in relation to adjacent splenic parenchyma and differential considerations include low-grade lymphoma or atypical hamartoma. Adrenals/Urinary Tract: Bilateral adrenal nodules are again identified and are indeterminate due to technical artifact related repeat bolus imaging. These are better assessed on prior examination 08/06/2023. Mild left hydronephrosis again identified in keeping  with a mild left UPJ obstruction. The kidneys are otherwise unremarkable save for a simple cortical cyst within the upper pole the right kidney for which no follow-up imaging is recommended. The bladder is decompressed. Stomach/Bowel: Stomach is within normal limits. Appendix appears normal. No evidence of bowel wall thickening, distention, or inflammatory changes. Trace ascites no free intraperitoneal gas. Lymphatic: No pathologic adenopathy within the abdomen and pelvis. Mild retroperitoneal edema again noted. Reproductive: Mild prostatic hypertrophy. Other: Mild diffuse subcutaneous body wall edema. Small bilateral fat containing inguinal hernias. Musculoskeletal: No acute bone abnormality. No lytic or blastic lesion. IMPRESSION: 1. No acute intra-abdominal pathology identified. No definite radiographic explanation for the patient's reported symptoms. 2. 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 3. Patency of the hepatic arterial and venous structures. 4. Cholelithiasis. 5. Stable mild left UPJ obstruction. 6. Bilateral adrenal nodules are again identified and are indeterminate due to technical artifact related repeat bolus imaging. These  are better assessed on prior examination 08/06/2023. Dedicated contrast enhanced MRI examination is recommended. 7. 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 8. Mild cardiomegaly. 9. The previously noted hypodense lesion within the upper pole of the spleen demonstrates progressive enhancement on delayed phase images but remains hypoenhancing in relation to adjacent splenic parenchyma and differential considerations include low-grade lymphoma or atypical hamartoma. This could be simultaneously assessed with contrast enhanced MRI examination. Aortic Atherosclerosis (ICD10-I70.0). Electronically Signed   By: Helyn Numbers M.D.   On: 08/07/2023 22:01   US THYROID Result Date: 08/07/2023 CLINICAL DATA:  Nodule posterior right on recent CT chest EXAM: THYROID ULTRASOUND TECHNIQUE: Ultrasound examination of the thyroid gland and adjacent soft tissues was performed. COMPARISON:  CT 08/06/2023 FINDINGS: Parenchymal Echotexture: Mildly heterogenous Isthmus: 0.6 cm thickness Right lobe: 3.6 x 1.2 x 1.7 cm Left lobe: 3.5 x 1.7 x 1.7 cm _________________________________________________________ Estimated total number of nodules >/= 1 cm: 0 Number of spongiform nodules >/=  2 cm not described below (TR1): 0 Number of mixed cystic and solid nodules >/= 1.5 cm not described below (TR2): 0 _________________________________________________________ Technologist describes technically difficult study, limited secondary to patient inability to extend neck. No discrete nodules are seen within the thyroid gland. No regional cervical adenopathy demonstrated. IMPRESSION: Negative limited study. The posterior right thyroid nodule described on CT was not identified. Electronically Signed   By: Corlis Leak M.D.   On: 08/07/2023 14:04   DG CHEST PORT 1 VIEW Result Date: 08/07/2023 CLINICAL DATA:  Follow-up right basilar opacity EXAM: PORTABLE CHEST 1 VIEW COMPARISON:   08/05/2023 FINDINGS: Cardiac shadow is within normal limits. Persistent small right effusion and basilar atelectasis is noted. Left lung remains clear. No bony abnormality is seen. Postsurgical changes in the cervical spine are noted. IMPRESSION: Stable right basilar opacity and effusion. Electronically Signed   By: Alcide Clever M.D.   On: 08/07/2023 02:25   US Abdomen Limited RUQ (LIVER/GB) Result Date: 08/07/2023 CLINICAL DATA:  Elevated LFTs EXAM: ULTRASOUND ABDOMEN LIMITED RIGHT UPPER QUADRANT COMPARISON:  CT from earlier in the same day. FINDINGS: Gallbladder: No gallstones or wall thickening visualized. No sonographic Murphy sign noted by sonographer. Common bile duct: Diameter: 4.7 mm. Liver: No focal lesion identified. Within normal limits in parenchymal echogenicity. Portal vein is patent on color Doppler imaging with normal direction of blood flow towards the liver. Other: None. IMPRESSION: No acute abnormality noted. Electronically Signed   By: Alcide Clever M.D.   On:  08/07/2023 00:44   CT CHEST ABDOMEN PELVIS W CONTRAST Result Date: 08/06/2023 CLINICAL DATA:  Abdomen or its really busy EXAM: CT CHEST, ABDOMEN, AND PELVIS WITH CONTRAST TECHNIQUE: Multidetector CT imaging of the chest, abdomen and pelvis was performed following the standard protocol during bolus administration of intravenous contrast. RADIATION DOSE REDUCTION: This exam was performed according to the departmental dose-optimization program which includes automated exposure control, adjustment of the mA and/or kV according to patient size and/or use of iterative reconstruction technique. CONTRAST:  75mL OMNIPAQUE IOHEXOL 350 MG/ML SOLN COMPARISON:  CT abdomen pelvis 04/20/2020 FINDINGS: CT CHEST FINDINGS Cardiovascular: Moderate coronary artery calcification. Global cardiac size within limits. Trace pericardial effusion. Central pulmonary arteries are of caliber. Mild atherosclerotic calcification within thoracic aorta. No aortic  aneurysm. Mediastinum/Nodes: No pathologic thoracic adenopathy. Stable 19 mm nodule within the posterior right thyroid lobe, not well characterized on this examination. Esophagus unremarkable. 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 No superimposed focal pulmonary infiltrate. No pneumothorax. Central airways are widely patent. Musculoskeletal: No chest wall mass or suspicious bone lesions identified. Probable bone island within the T9 vertebral body. CT ABDOMEN PELVIS FINDINGS Hepatobiliary: No focal liver abnormality is seen. No gallstones, gallbladder wall thickening, or biliary dilatation. Pancreas: Unremarkable Spleen: Mild splenomegaly with the spleen measuring 13.5 cm in greatest dimension. Enlarging 3.4 cm complex hypoechoic lesion within the upper pole the spleen demonstrating heterogeneous attenuation with a probable solid component superiorly. Differential considerations include a hypoenhancing hamartoma, low-grade lymphoma, or potentially inflammatory pseudotumor, however, this is not well characterized on this examination. Adrenals/Urinary Tract: Bilateral adrenal nodules have developed since prior examination and are indeterminate, measuring 40-55 Hounsfield units in density. These measure up to 2.5 cm in diameter on left. The kidneys are normal in size and position. 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 Simple cortical cyst noted within the upper pole the right kidney for which no follow-up imaging is recommended. The kidneys are otherwise unremarkable. The bladder is partially decompressed and is unremarkable. Stomach/Bowel: Mild perihepatic ascites. The stomach, small, and large bowel are unremarkable. Appendix normal. No free  intraperitoneal gas. Vascular/Lymphatic: Aortic atherosclerosis. No enlarged abdominal or pelvic lymph nodes. Reproductive: Mild prostatic hypertrophy. Other: Mild subcutaneous body wall edema. Mild retroperitoneal edema. Small bilateral fat containing inguinal hernias. Musculoskeletal: No acute or significant osseous findings. IMPRESSION: 1. No acute intrathoracic or intra-abdominal pathology identified. 2. Moderate coronary artery calcification. 3. 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 4. Enlarging 3.4 cm complex hypoechoic lesion within the upper pole the spleen demonstrating heterogeneous attenuation with a probable solid component superiorly. Differential considerations include a hypoenhancing hamartoma, low-grade lymphoma, or potentially inflammatory pseudotumor, however, this is not well characterized on this examination. This could be simultaneously assessed with dedicated MRI examination of the adrenal glands. 5. Bilateral adrenal nodules have developed since prior examination and are indeterminate, measuring 40-55 Hounsfield units in density. These measure up to 2.5 cm in diameter on left. Dedicated contrast enhanced MRI examination is recommended for further characterization. 6. Mild splenomegaly. 7. Mild anasarca with trace ascites and body wall retroperitoneal edema. 8. Stable 19 mm nodule within the posterior right thyroid lobe, not well characterized on this examination. Recommend thyroid US (  ref: J Am Coll Radiol. 2015 Feb;12(2): 143-50). Aortic Atherosclerosis (ICD10-I70.0). Electronically Signed   By: Helyn Numbers M.D.   On: 08/06/2023 15:14   ECHOCARDIOGRAM COMPLETE Result Date: 08/06/2023    ECHOCARDIOGRAM REPORT   Patient Name:   FILIPE GREATHOUSE Date of Exam: 08/06/2023 Medical Rec #:  [id number]        Height:       71.0 in Accession #:     [id number]       Weight:       191.0 lb Date of Birth:  14-Jun-1950        BSA:          2.068 m Patient Age:    72 years         BP:           110/87 mmHg Patient Gender: M                HR:           72 bpm. Exam Location:  Inpatient Procedure: 2D Echo, Cardiac Doppler, Color Doppler and Intracardiac            Opacification Agent (Both Spectral and Color Flow Doppler were            utilized during procedure). Indications:    Stroke  History:        Patient has prior history of Echocardiogram examinations, most                 recent 04/07/2023. CHF, Stroke, Signs/Symptoms:Syncope; Risk                 Factors:Dyslipidemia, Diabetes, Hypertension and Sleep Apnea.  Sonographer:    Sheralyn Boatman RDCS Referring Phys: [id number] DENISE A WOLFE  Sonographer Comments: Technically difficult study due to poor echo windows, Technically challenging study due to limited acoustic windows, suboptimal parasternal window, suboptimal apical window and suboptimal subcostal window. Image acquisition challenging due to respiratory motion. Suboptimal windows despite attempts to turn patient. IMPRESSIONS  1. Left ventricular ejection fraction, by estimation, is 60 to 65%. The left ventricle has normal function. The left ventricle has no regional wall motion abnormalities. Left ventricular diastolic parameters are consistent with Grade I diastolic dysfunction (impaired relaxation).  2. Right ventricular systolic function is normal. The right ventricular size is normal. Tricuspid regurgitation signal is inadequate for assessing PA pressure.  3. The mitral valve was not well visualized. No evidence of mitral valve regurgitation. No evidence of mitral stenosis.  4. The aortic valve was not well visualized. There is mild calcification of the aortic valve. Aortic valve regurgitation is not visualized. Aortic valve sclerosis is present, with no evidence of aortic valve stenosis. Aortic valve Vmax measures 2.19 m/s.  5. The inferior vena cava is  normal in size with greater than 50% respiratory variability, suggesting right atrial pressure of 3 mmHg. FINDINGS  Left Ventricle: Left ventricular ejection fraction, by estimation, is 60 to 65%. The left ventricle has normal function. The left ventricle has no regional wall motion abnormalities. Definity contrast agent was given IV to delineate the left ventricular  endocardial borders. The left ventricular internal cavity size was normal in size. There is no left ventricular hypertrophy. Left ventricular diastolic parameters are consistent with Grade I diastolic dysfunction (impaired relaxation). Right Ventricle: The right ventricular size is normal. No increase in right ventricular wall thickness. Right ventricular systolic function is normal. Tricuspid regurgitation signal is inadequate for assessing PA pressure. Left Atrium: Left  atrial size was normal in size. Right Atrium: Right atrial size was normal in size. Pericardium: There is no evidence of pericardial effusion. Mitral Valve: The mitral valve was not well visualized. No evidence of mitral valve regurgitation. No evidence of mitral valve stenosis. Tricuspid Valve: The tricuspid valve is not well visualized. Tricuspid valve regurgitation is not demonstrated. No evidence of tricuspid stenosis. Aortic Valve: The aortic valve was not well visualized. There is mild calcification of the aortic valve. Aortic valve regurgitation is not visualized. Aortic valve sclerosis is present, with no evidence of aortic valve stenosis. Aortic valve mean gradient measures 9.0 mmHg. Aortic valve peak gradient measures 19.2 mmHg. Aortic valve area, by VTI measures 2.56 cm. Pulmonic Valve: The pulmonic valve was normal in structure. Pulmonic valve regurgitation is not visualized. No evidence of pulmonic stenosis. Aorta: The aortic root is normal in size and structure. Venous: The inferior vena cava is normal in size with greater than 50% respiratory variability, suggesting  right atrial pressure of 3 mmHg. IAS/Shunts: No atrial level shunt detected by color flow Doppler.  LEFT VENTRICLE PLAX 2D LVIDd:         4.90 cm LVIDs:         3.50 cm LV PW:         1.00 cm LV IVS:        0.90 cm LVOT diam:     2.30 cm LV SV:         91 LV SV Index:   44 LVOT Area:     4.15 cm  LV Volumes (MOD) LV vol d, MOD A2C: 95.7 ml LV vol d, MOD A4C: 108.0 ml LV vol s, MOD A2C: 46.4 ml LV vol s, MOD A4C: 38.6 ml LV SV MOD A2C:     49.3 ml LV SV MOD A4C:     108.0 ml LV SV MOD BP:      67.9 ml RIGHT VENTRICLE             IVC RV S prime:     14.00 cm/s  IVC diam: 1.90 cm TAPSE (M-mode): 3.2 cm LEFT ATRIUM             Index        RIGHT ATRIUM           Index LA diam:        3.50 cm 1.69 cm/m   RA Area:     10.90 cm LA Vol (A2C):   41.3 ml 19.97 ml/m  RA Volume:   19.40 ml  9.38 ml/m LA Vol (A4C):   62.6 ml 30.28 ml/m LA Biplane Vol: 51.3 ml 24.81 ml/m  AORTIC VALVE AV Area (Vmax):    2.16 cm AV Area (Vmean):   2.48 cm AV Area (VTI):     2.56 cm AV Vmax:           219.00 cm/s AV Vmean:          139.000 cm/s AV VTI:            0.355 m AV Peak Grad:      19.2 mmHg AV Mean Grad:      9.0 mmHg LVOT Vmax:         114.00 cm/s LVOT Vmean:        83.100 cm/s LVOT VTI:          0.219 m LVOT/AV VTI ratio: 0.62  AORTA Ao Root diam: 3.10 cm MITRAL VALVE MV Area (PHT): 3.08 cm  SHUNTS MV Decel Time: 246 msec    Systemic VTI:  0.22 m MV E velocity: 82.70 cm/s  Systemic Diam: 2.30 cm MV A velocity: 90.30 cm/s MV E/A ratio:  0.92 Donato Schultz MD Electronically signed by Donato Schultz MD Signature Date/Time: 08/06/2023/1:02:26 PM    Final    DG Chest Port 1 View Result Date: 08/05/2023 CLINICAL DATA:  Shortness of breath. EXAM: PORTABLE CHEST 1 VIEW COMPARISON:  August 04, 2023. FINDINGS: Stable cardiomediastinal silhouette. Left lung is clear. Stable right basilar opacities are noted concerning for subsegmental atelectasis or possibly pneumonia. Bony thorax is unremarkable. IMPRESSION: Stable right basilar opacities  as noted above. Electronically Signed   By: Lupita Raider M.D.   On: 08/05/2023 11:23   MR CERVICAL SPINE W WO CONTRAST Result Date: 08/04/2023 CLINICAL DATA:  hyperreflexia and worsening neurological status in setting of prior C-spine surgery EXAM: MRI CERVICAL SPINE WITHOUT AND WITH CONTRAST TECHNIQUE: Multiplanar and multiecho pulse sequences of the cervical spine, to include the craniocervical junction and cervicothoracic junction, were obtained without and with intravenous contrast. CONTRAST:  8.21mL GADAVIST GADOBUTROL 1 MMOL/ML IV SOLN COMPARISON:  None Available. FINDINGS: Moderately motion limited study. Alignment: No substantial sagittal subluxation. Vertebrae: Motion limited evaluation. Edema involving the left T1 inferior articular facet. C5-C7 ACDF. Cord: Cord T2/hyperintensity from C2 to C3-C4. Posterior Fossa, vertebral arteries, paraspinal tissues: Visualized vertebral artery flow voids are maintained. No significant paraspinal edema. Disc levels: C2-C3: No significant disc protrusion, foraminal stenosis, or canal stenosis. C3-C4: Posterior disc osteophyte complex with ligamentum flavum thickening and bilateral facet and uncovertebral hypertrophy. Resulting severe canal and left foraminal stenosis. Moderate right foraminal stenosis. C4-C5: Small posterior disc osteophyte complex and left greater than right facet and uncovertebral hypertrophy. Mild left foraminal stenosis. Patent canal. C5-C6: ACDF.  Patent canal and foramina. C6-C7: ACDF.  Patent canal and foramina. C7-T1: Small posterior disc osteophyte complex and bilateral facet and uncovertebral hypertrophy. Patent canal and foramina. IMPRESSION: 1. 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 Cord T2 hyperintensity superior to this level, suspicious for edema and compressive myelopathy. Severe left and moderate right foraminal stenosis at this level. 2. Edema involving the left T1 inferior articular facet, which is nonspecific but most likely related to  stress. A CT could better assess bone detail and exclude fracture if clinically warranted. Electronically Signed   By: Feliberto Harts M.D.   On: 08/04/2023 20:53   MR THORACIC SPINE WO CONTRAST Result Date: 08/04/2023 CLINICAL DATA:  Mid-back pain, neuro deficit EXAM: MRI THORACIC SPINE WITHOUT CONTRAST TECHNIQUE: Multiplanar, multisequence MR imaging of the thoracic spine was performed. No intravenous contrast was administered. COMPARISON:  CT of the chest February 27, 2017. FINDINGS: Alignment:  No substantial sagittal subluxation. Vertebrae: Sclerotic lesion at T9 was present in 2018 and therefore likely is benign. No suspicious bone lesions. No bone marrow edema to suggest acute fracture. Vertebral body heights are maintained. Cord:  Normal cord signal. Paraspinal and other soft tissues: Motion limited evaluation without definite abnormality. Disc levels: No significant canal or foraminal stenosis. IMPRESSION: No evidence of acute abnormality or significant stenosis. Electronically Signed   By: Feliberto Harts M.D.   On: 08/04/2023 19:54   MR BRAIN WO CONTRAST Result Date: 08/04/2023 CLINICAL DATA:  Stroke, follow up EXAM: MRI HEAD WITHOUT CONTRAST TECHNIQUE: Multiplanar, multiecho pulse sequences of the brain and surrounding structures were obtained without intravenous contrast. COMPARISON:  CT head earlier today. FINDINGS: Brain: Many small acute infarcts in the bilateral frontal and parietal lobes, left  thalamus, right occipital lobe, and cerebellum. Associated Dema without substantial mass effect. No midline shift. No evidence of acute hemorrhage, mass lesion, or hydrocephalus. Vascular: Major arterial flow voids are maintained at the skull base. Skull and upper cervical spine: Normal marrow signal. Sinuses/Orbits: Negative. IMPRESSION: Many small acute infarcts in the bilateral frontal and parietal lobes, left thalamus, right occipital lobe, and cerebellum. Given involvement of multiple vascular  territories, consider an embolic etiology. Electronically Signed   By: Feliberto Harts M.D.   On: 08/04/2023 19:25   DG Chest Port 1 View Result Date: 08/04/2023 CLINICAL DATA:  [id number] SOB (shortness of breath) [id number] EXAM: PORTABLE CHEST 1 VIEW COMPARISON:  08/01/2023 FINDINGS: Left chest wall loop recorder. Stable heart size. Low lung volumes. Chronic pleural thickening at the right costophrenic angle. Increased streaky bibasilar opacities. No definite pleural effusion. No pneumothorax. IMPRESSION: Low lung volumes with increased streaky bibasilar opacities, which may represent atelectasis versus pneumonia. Electronically Signed   By: Duanne Guess D.O.   On: 08/04/2023 12:00   EEG adult Result Date: 08/04/2023 Charlsie Quest, MD     08/04/2023 12:10 PM Patient Name: MISAEL MCGAHA MRN: [id number] Epilepsy Attending: Charlsie Quest Referring Physician/Provider: Leroy Sea, MD Date: 08/04/2023 Duration: 25.23 mins Patient history: 73 year old male with altered mental status.  EEG to alert for seizure. Level of alertness: Awake AEDs during EEG study: None Technical aspects: This EEG study was done with scalp electrodes positioned according to the 10-20 International system of electrode placement. Electrical activity was reviewed with band pass filter of 1-70Hz , sensitivity of 7 uV/mm, display speed of 40mm/sec with a 60Hz  notched filter applied as appropriate. EEG data were recorded continuously and digitally stored.  Video monitoring was available and reviewed as appropriate. Description: The posterior dominant rhythm consists of 7 Hz activity of moderate voltage (25-35 uV) seen predominantly in posterior head regions, symmetric and reactive to eye opening and eye closing. EEG showed continuous generalized 6 to 7 Hz theta slowing. Hyperventilation and photic stimulation were not performed.   ABNORMALITY - Continuous slow, generalized IMPRESSION: This study is suggestive of mild diffuse  encephalopathy. No seizures or epileptiform discharges were seen throughout the recording. Priyanka Annabelle Harman   CT HEAD WO CONTRAST ( ) Result Date: 08/04/2023 CLINICAL DATA:  Provided history: Memory loss. EXAM: CT HEAD WITHOUT CONTRAST TECHNIQUE: Contiguous axial images were obtained from the base of the skull through the vertex without intravenous contrast. RADIATION DOSE REDUCTION: This exam was performed according to the departmental dose-optimization program which includes automated exposure control, adjustment of the mA and/or kV according to patient size and/or use of iterative reconstruction technique. COMPARISON:  Non-contrast head CT and CT angiogram head 04/06/2023. Brain MRI 04/06/2023. FINDINGS: Brain: Moderate generalized cerebral atrophy. 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 Small chronic cortical/subcortical infarct within the mid right frontal lobe, new from the prior head CT of 04/06/2023. There is no acute intracranial hemorrhage. No acute demarcated cortical infarct. No extra-axial fluid collection. No evidence of an intracranial mass. No midline shift. Vascular: No hyperdense vessel. Atherosclerotic calcifications. Skull: No calvarial fracture or aggressive osseous lesion. Sinuses/Orbits: No mass or acute finding within the imaged orbits. Minimal mucosal thickening within the bilateral maxillary and right sphenoid sinuses. Mild mucosal thickening within the bilateral ethmoid sinuses. Minimal mucosal thickening within the left frontal sinus. IMPRESSION: 1.  No evidence of an acute intracranial abnormality. 2. 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 3. Known small chronic infarcts within the left frontal, left parietal and right temporal  lobes. 4. Moderate generalized cerebral atrophy. 5. Paranasal sinus disease as described. Electronically Signed   By: Jackey Loge D.O.   On: 08/04/2023 09:40   DG Chest Port 1 View Result Date: 08/01/2023 CLINICAL DATA:  Generalized weakness, onset 8 p.m. last night. EXAM: PORTABLE CHEST 1 VIEW COMPARISON:  AP Lat chest 04/11/2023 FINDINGS: There is chronic thickening of lower lateral right pleura, associated chronic pleuroparenchymal disease in the lateral right base. The lungs are otherwise clear. A loop recorder device is again noted on the left. The cardiac size is normal. There is mild aortic tortuosity and atherosclerosis with a stable mediastinum and no vascular congestion. No new osseous findings. Dorsal and ventral cervical fusion hardware is partially visible. Multilevel thoracic spine bridging enthesopathy. IMPRESSION: 1. No evidence of acute chest disease. 2. 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 3. Aortic atherosclerosis. Electronically Signed   By: Almira Bar M.D.   On: 08/01/2023 07:44    ADDENDUM  .Patient was Personally and independently reviewed and relevant elements of the interval HPI, labs and imaging were reviewed in details and an assessment and plan was created. All elements of the patient's history of present illness , assessment and plan were discussed in details with Hinton Dyer NP. The above documentation reflects our combined findings assessment and plan.   We were previously consulted for concerns for recurrent CVAs despite dual antiplatelet therapy.  Neurology at that time had anticoagulation for his CVAs.  Brilinta was on hold for possible surgery for his severe cervical myelopathy. Patient since has had progressive declining status with worsening mental status concerns with sepsis with increasing procalcitonin levels and lactic acid levels, currently on broad-spectrum antibiotics with Zosyn plus vancomycin.  Is currently sedated intubated and on  pressors. There was some concern for intermittent A-fib with RVR and patient had been started on IV amiodarone and had started on IV heparin for anticoagulation.  He also developed acute liver injury which was thought to be related to possible sepsis.  Liver imaging suggests hepatocellular injury versus biliary obstruction.  Patient had a drain placed.  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  No New thrombosis.  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 Patient has also developed significant coagulopathy with a prothrombin time of 18.5 APTT of 69 fibrinogen of 156 and D-dimer of 3.71.  He also has a leukoerythroblastic reaction likely related to sepsis. Overall the patient's thrombocytopenia seems to have cleared 5 days after IV heparin initiation he has several causes for his progressive thrombocytopenia including progressive sepsis, DIC related to sepsis and progressive liver failure, progressive liver failure, consumption related to platelets due to GI bleed, antibiotics including Zosyn and vancomycin.  Reasonable to send out HIT antibodies and SRA though doubt this is the driving etiology of the overall presentation. At this time with dropping platelets less than 27k and complex coagulopathy given DIC and liver failure the patient is not a good candidate for any anticoagulation. Patient's sepsis is being treated for PCCM and hospital medicine. He remains very complicated and has risk of both bleeding and clotting. Would certainly recommend ongoing goals of care discussion as per the primary team.  Wyvonnia Lora MD MS

## 2023-08-14 NOTE — Progress Notes (Signed)
 [facility] Summary Report / Loop Recorder

## 2023-08-14 NOTE — Progress Notes (Addendum)
 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 (factor V heterozygous mutation, and prothrombin gene heterozygous deletion, and protein S deficiency) and history of chronic DVT R-LE; also runs of Afib this admit  Patient Measurements: Height: 5' 10.98" (180.3 cm) Weight: 106 kg (233 lb 11 oz) IBW/kg (Calculated) : 75.26 HEPARIN DW (KG): 86.6  Vital Signs: Temp: 98.4 F (36.9 C) (04/04 0400) Temp Source: Axillary (04/04 0400) BP: 134/63 (04/04 0500) Pulse Rate: 74 (04/04 0727)  Labs: Recent Labs    08/12/23 0500 08/12/23 0757 08/13/23 0422 08/13/23 0801 08/13/23 0925 08/13/23 1738 08/14/23 0409  HGB 7.5*  --  7.6*  --   --   --  8.0*  HCT 22.7*  --  23.1*  --   --   --  24.0*  PLT 73*  --  65*  --   --   --  34*  HEPARINUNFRC  --    < > 0.21* 0.26*  --  0.30 0.27*  CREATININE 1.37*  --   --   --  1.56*  --  1.77*   < > = values in this interval not displayed.    Estimated Creatinine Clearance: 45.4 mL/min (A) (by C-G formula based on SCr of 1.77 mg/dL (H)).  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 Patient was admitted 3/22 for progressive weakness. 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 Pharmacy consulted to dose heparin 3/30. Now (4/4) with significant platelet drop, poor clinical status, and non-responsive to high rates of heparin with concern for HIT, consulted to change to Argatroban therapy while rule out HIT.  Platelets down to 34 (50% drop, day 8 therapy) >> HIT testing  ordered.  Hgb stable at 8. No overt bleeding noted.  Liver injury resolving - LFTs normalizing. Some jaundice noted. Discussed with team. Avoiding Bivalirudin with worsening AKI.   Goal of Therapy:  APTT 50-70 Monitor platelets by anticoagulation protocol: Yes   Plan:  Discontinue Heparin. Start Argatroban at 0.25 mcg/kg/min with multiple organ injuries noted. Check aPTT in 4 hours.  Once therapeutic, check aPTT every 12 hours.   Link Snuffer, PharmD, BCPS, BCCCP Please refer to [facility] for [facility] Pharm[facility] numbers 08/14/2023 7:43 AM  Addendum: 4 hour aPTT = 80 - little higher than tight goal.  Plan: Decrease Argatroban to 0.2 mcg/kg/min. Recheck aPTT in 4 hours.   Addendum 2: DIC panel back - Plts now down to 27.  Plan: Stop Argatroban per Dr. Candise Che and Dr. Celine Mans at this time.   F/up CBC  Link Snuffer, PharmD, BCPS, BCCCP Please refer to [facility] for [facility] Pharmacy numbers 08/14/2023, 1:58 PM

## 2023-08-14 NOTE — Progress Notes (Signed)
 Date and time results received: 08/14/23 11:53 AM  (use smartphrase ".now" to insert current time)  Test: Platelets Critical Value: 27  Name of Provider Notified: Celine Mans, MD  Orders Received? Or Actions Taken?:

## 2023-08-14 NOTE — Addendum Note (Signed)
 Addended by: Geralyn Flash D on: 08/14/2023 12:51 PM   Modules accepted: Orders

## 2023-08-14 NOTE — Progress Notes (Signed)
 Pharmacy Antibiotic Note  Kenneth [name] is a 73 y.o. male admitted on 08/01/2023 with pneumonia and intra-abdominal/biliary infection (drain in place).  Pharmacy has been consulted for Vancomycin and Zosyn dosing.  Vancomycin random level = 12 (~22 hrs after last dose) WBC 26.8 (up), ANC 26 (up), LA remains >9, Tm 99.4, SCr up at 1.77  Plan: Continue Zosyn 3.375 g EI q8h  Adjust Vancomycin to 1500 q36 hours for eAUC 459 (Vd 0.5, SCr used 1.77) Monitor worsening AKI - consider doses based on levels Follow cultures and length of therapy   Height: 5' 10.98" (180.3 cm) Weight: 106 kg (233 lb 11 oz) IBW/kg (Calculated) : 75.26  Temp (24hrs), Avg:98.8 F (37.1 C), Min:98.4 F (36.9 C), Max:99.4 F (37.4 C)  Recent Labs  Lab 08/10/23 1620 08/10/23 1800 08/11/23 0037 08/11/23 0453 08/11/23 2304 08/12/23 0500 08/12/23 1700 08/12/23 2217 08/13/23 0422 08/13/23 0925 08/13/23 1315 08/14/23 0409 08/14/23 1321  WBC 16.8*  --  16.5*  --  27.3* 25.7*  --   --  25.2*  --   --  26.8*  --   CREATININE 1.30*  --  1.33*  --   --  1.37*  --   --   --  1.56*  --  1.77*  --   LATICACIDVEN  --    < > 8.3*   < >  --  8.2* 7.9* 9.0* >9.0*  --  >9.0* >9.0*  --   VANCORANDOM  --   --   --   --   --   --   --   --   --   --   --   --  12   < > = values in this interval not displayed.    Estimated Creatinine Clearance: 45.4 mL/min (A) (by C-G formula based on SCr of 1.77 mg/dL (H)).    Allergies  Allergen Reactions   Jardiance [Empagliflozin] Other (See Comments)    To the hospital- due to complications   Fluoxetine Other (See Comments)    Prefers to NOT take this   Heparin Other (See Comments)    Rule out HIT - avoid heparin while await test results   Penicillins Other (See Comments)    Tolerated Unasyn March 2025   Silodosin Other (See Comments)    Vertigo     3/28 bcx- MRSE in 1 bottle, likely contaminant  3/28 MRSA nares neg   Thank you for allowing pharmacy to be a part of this  patient's care.  Link Snuffer, PharmD, BCPS, BCCCP Please refer to [facility] for [facility] Pharmacy numbers 08/14/2023, 2:54 PM

## 2023-08-14 NOTE — Progress Notes (Signed)
 eLink Physician-Brief Progress Note Patient Name: Kenneth [name] DOB: 04-07-1951 MRN: [id number]   Date of Service  08/14/2023  HPI/Events of Note  Patient going in and out of afib with HR as high as 170-180 with adverse affect on bp.  On amiodarone earlier in course for same problem.  Last cardiology notes suggests restarting if arrhythmia recurs.  eICU Interventions  Restart amiodarone     Intervention Category Intermediate Interventions: Arrhythmia - evaluation and management  Henry Russel, P 08/14/2023, 9:56 PM

## 2023-08-14 NOTE — Progress Notes (Signed)
 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   On ED arrival  pt was afebrile, HR:63, BP: 104/47, RR 23. Labs were notable for WBC 7.9, Hgb: 9.0( baseline around 12), Platelets 87, Na: 132, K: 4.1, CO2: 21, Cr, 0.87( Baseline), Transaminases/ ALK Phos WNL, T.bili elevated at 1.9, Mg: 1.4. Troponin : 5--> 4, BNP: 33. FOBT negative, Iron studies consistent with deficiency, B12 low. TSH/T4 WNL. UA grossly normal. RVP negative. CXR no acute, chronic right base changes.  Admitted to [facility]. GI was consulted for anemia in the setting of DAPT with recommendation for BID PPI and EGD to r/o occult GIB. EGD completed 3/23 with mild gastritis, no evidence of active bleeding. Neurology consulted(3/25) for new onset confusion. CT head completed 3/25 with NAICA, new small chronic cortical/ subcortical infarct R frontal lobe ( new from prior), known small chronic infarcts, L frontal/ L parietal/ R temporal lobes. EEG (3/25) showed mild diffuse encephalopathy, no seizures. F/u MRI brain (3/25) showed many small acute infarcts concerning for embolic etiology. MRI C-Spine with severe canal stenosis concerning for edema and compressive myelopathy, severe left/moderate right foraminal stenosis, edema of left T1 inferior articular facet. MRI T-spine negative. Given patients progressive weakness in the setting of known C-Spine compression, NSG consulted for  evaluation 3/26 with recommendation for eventual cord decompression once optimized from a medical standpoint.Additionally, psychiatry was consulted for recommendations on medication management for depressed mood. Cardiology was consulted 3/27 for preoperative cardiac risk stratification.  On 3/28AM, patient was reportedly hot to touch per RN with increased RR and WOB and Tmax 103F. CXR obtained 3/28 stable . WBC slightly elevated to 10.8 , INR 1.4, K 3.3, CO2 21. PCT 1.35 ( 0.65), LA 2.2 with repeat >9.   PCCM consulted for concern for developing sepsis, acute liver injury.  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 CT Head/MRI Brain obtained. MRI C-spine and T-spine completed. 3/26 - NSGY evaluation for ?decompression surgery.  3/27 - Cards consult for preoperative risk stratification for possible cord decompression  surgery, tentatively 3/31. 3/28 - Early AM, febrile to Tmax 103F with increased RR/WOB and ?aspiration; LA 2.2 with mild WBC elevation to 10.8; CXR stable. LA > 9 prompted PCCM consult (aberrant value, repeat 2.1). LFTs elevated. Worsening mental status/drowsiness. 3/29  Worsening mental status. Intubated for airway protection. MRI with new 5 mm acute infarct in left pons and numerous acute/subacute infarcts without interval progression 4/1: SVT overnight received adenosine. Followed by aflutter received amio bolus and gtt. In sinus in AM. Ongoing profound septic shock on 3 vasopressors, bicarb gtt. Unclear source but high suspicion for biliary - going for HIDA scan  4/2: IR consulted after HIDA and deferred intervention. He was re-pan scanned with no other obvious source of infection. In AM  with ongoing lactic of 8, shock, and persistently elevating LFTs. IR re-consulted and will evaluate patient for perc drain.  4/3: unfortunately no improvement after IR bili drain placed. Morning lactate 9. Will have ongoing GOC with family/neuro/ccm today  Interim History / Subjective:  This morning with worsening thrombocytopenia, jaundice, lethargy  Objective:  Blood pressure 134/63, pulse 74, temperature 99 F (37.2 C), temperature source Axillary, resp. rate (!) 22, height 5' 10.98" (1.803 m), weight 106 kg, SpO2 99%.    Vent Mode: PSV;CPAP FiO2 (%):  [40 %] 40 % Set Rate:  [18 bmp] 18 bmp Vt Set:  [600 mL] 600 mL PEEP:  [5 cmH20] 5 cmH20 Pressure Support:  [10 cmH20] 10 cmH20 Plateau Pressure:  [16 cmH20-18 cmH20] 18 cmH20   Intake/Output Summary (Last 24 hours) at 08/14/2023 0941 Last data filed at 08/14/2023 0500 Gross per 24 hour  Intake 2949.53 ml  Output 1585 ml  Net 1364.53 ml   Filed Weights   08/10/23 0500 08/11/23 0444 08/13/23 0500  Weight: 102.2 kg 103 kg 106 kg   Physical Exam: Gen:      Intubated, sedated, acutely ill appearing HEENT:  ETT to vent Lungs:    sounds of mechanical ventilation auscultated no wheeze, diminished CV:         tachycardic, regular Abd:      Distended, soft Ext:    No edema Skin:      jaundiced Neuro: not sedated, not alert but withdraws to noxious stimuli +cough and gag, HOH   Labs and imaging personally  reviewed - CBGs>200 Bilirubin increasing Cr 1.77 Lactic acid >9  Resolved Hospital Problem List:   Assessment & Plan:  Cryptogenic CVAs, 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 TCD bubble study 2/15 with "small, clinically insignificant PFO". 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 Failed Plavix and Brilinta - stroke team consulted, appreciate management  - IV heparin for now  - no aspirin per neuro due to worsening thrombocytopenia  - con't hold statin, zetia with liver injury  - serial neuro checks - neuroprotective measures - mirtazapine stopped with encephalopathy - anticipate SNF  - not appropriate for PT/OT at this time  Acute hypoxemic respiratory failure secondary to aspiration in setting of recurrent strokes Tracheal aspirate with staph epi growing. Was also on initial blood culture 1/4 bottles.  - on vanc/zosyn which coverage  - Not appropriate for SBT at this time - full mechanical vent support - lung protective ventilation 6-8cc/kg Vt - VAP and PAD bundle in place  - titrate FiO2  to sat goal >92  - maintain peak/plats <30, driving pressures <25    Septic shock, unknown etiology, suspect biliary  Lactic Acidosis  Unclear etiology, s/p percutaneous biliary drain placement 4/2 with concern for obstruction, no substantial improvement -Continue PERC drain for now - con't vancomycin, zosyn empirically - con't vaso, levo, for MAP greater than 65 - continue hydrocortisone 100mg  q12h  - con't bicarb gtt, may need to come off once bicarb normalizes  Acute kidney injury Component of multiorgan failure, likely hypoperfusion versus septic ATN. -Given trial of Lasix on 4/2 given that he is 13 L positive.  He did have slight  increase in urine output, however serum creatinine rose from 1.37-1.56 with BUN elevation to 89. -Hold further diuresis -- trend bmp, mag, phos - replete elytes - strict I&O - Avoid nephrotoxic agents, renally dose medications - ensure adequate renal perfusion   Elevated LFTs  Hyperbilirubinemia-- worsening Thrombocytopenia Differential includes embolic concern (PVT versus Budd-Chiari, given history of clotting disorder), DILI, acute hepatitis, sepsis, cholecystitis, cholangitis. RUQ as above. 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   Cervical spinal stenosis with cord compression S/p prior ACDF (2014) MRI C-spine with severe canal stenosis concerning for edema and compressive myelopathy, severe left/moderate right foraminal stenosis, edema of left T1 inferior articular facet. MRI T-spine negative. - NSGY consulted- tentative plan for cord decompression, waiting on improvement in overall medical status. -PT, OT when stable  Anemia, multifactorial in the setting of critical illness, B12 deficiency, iron deficiency -transfuse for Hb <7 or hemodynamically significant bleeding -iron, thiamine, folace, B12 supplements  T2DM with hyperglycemia - SSI, will increase to resistant - goal BG 140-180 - increasing basal to 40 units daily - increased tube feed coverage   History of diverticulosis - Noted history  Hard of hearing,  congenital -hearing aids available   GOC: Currently DNR. Updated wife and family at bedside today conveying severity of illness and concern for high morbidity/mortality.   Best Practice: (right click and "Reselect all SmartList Selections" daily)   Diet/type: tubefeeds and NPO DVT prophylaxis: other argatroban GI prophylaxis: PPI Lines: PICC Foley:  yes Code Status:  full code Last date of multidisciplinary goals of care discussion: see GOC above  Critical care time:     The patient is critically ill due to shock, encephalopathy, liver failure, respiratory failure.  Critical care was necessary to treat or prevent imminent or life-threatening deterioration.  Critical care was time spent personally by me on the following activities: development of treatment plan with patient and/or surrogate as well as nursing, discussions with consultants, evaluation of patient's response to treatment, examination of patient, obtaining history from patient or surrogate, ordering and performing treatments and interventions, ordering and review of laboratory studies, ordering and review of radiographic studies, pulse oximetry, re-evaluation of patient's condition and participation in multidisciplinary rounds.   Critical Care Time devoted to patient care services described in this note is 55 minutes. This time reflects time of care of this signee Olene Craven  Celine Mans . This critical care time does not reflect separately billable procedures or procedure time, teaching time or supervisory time of PA/NP/Med student/Med Resident etc but could involve care discussion time.       Charlott Holler [facility] 08/14/2023 9:42 AM  Pager: see AMION  If no response to pager , please call critical care on call (see AMION) until 7pm After 7:00 pm call Elink

## 2023-08-14 NOTE — Progress Notes (Addendum)
 Referring Physician(s): Deri Fuelling  Supervising Physician: Roanna Banning  Patient Status:  [facility] - In-pt  Chief Complaint:  Acute cholecystitis s/p percutaneous cholecystostomy in IR 08/12/23  Subjective:  Patient remains intubated/sedated in the ICU.  Non responsive on vent.  RUQ drain in place.   Allergies: Jardiance [empagliflozin], Fluoxetine, Penicillins, and Silodosin  Medications: Prior to Admission medications   Medication Sig Start Date End Date Taking? Authorizing Provider  aspirin 81 MG chewable tablet Chew 1 tablet (81 mg total) by mouth daily. 04/08/23  Yes Marinda Elk, MD  atorvastatin (LIPITOR) 40 MG tablet Take 1 tablet (40 mg total) by mouth daily. Patient taking differently: Take 40 mg by mouth at bedtime. 10/17/22  Yes Love, Evlyn Kanner, PA-C  benazepril (LOTENSIN) 40 MG tablet Take 40 mg by mouth daily.   Yes [provider]  BRILINTA 90 MG TABS tablet Take 90 mg by mouth 2 (two) times daily. 04/24/23  Yes [provider]  chlorthalidone (HYGROTON) 25 MG tablet Take 25 mg by mouth daily.   Yes [provider]  Cholecalciferol (VITAMIN D3) 1000 units CAPS Take 1,000 Units by mouth in the morning and at bedtime.   Yes [provider]  Continuous Glucose Sensor (FREESTYLE LIBRE 14 DAY SENSOR) MISC Inject 1 Device into the skin every 14 (fourteen) days.   Yes [provider]  escitalopram (LEXAPRO) 20 MG tablet Take 20 mg by mouth daily.   Yes [provider]  ezetimibe (ZETIA) 10 MG tablet Take 10 mg by mouth daily. 01/19/20  Yes [provider]  metFORMIN (GLUCOPHAGE-XR) 500 MG 24 hr tablet Take 500 mg by mouth in the morning and at bedtime.   Yes [provider]  tamsulosin (FLOMAX) 0.4 MG CAPS capsule Take 0.4 mg by mouth daily. 07/31/23  Yes [provider]  TOUJEO SOLOSTAR 300 UNIT/ML Solostar Pen Inject 40 Units into the skin in the morning. 04/08/23  Yes Marinda Elk, MD  MOUNJARO 5 MG/0.5ML Pen Inject 5 mg into the skin every Monday. Patient not taking: Reported on 08/01/2023    [provider]     Vital Signs: BP (!) 141/55   Pulse 60   Temp 98.7 F (37.1 C) (Axillary)   Resp (!) 35   Ht 5' 10.98" (1.803 m)   Wt 233 lb 11 oz (106 kg)   SpO2 100%   BMI 32.61 kg/m   Physical Exam Vitals and nursing note reviewed.  Constitutional:      Comments: Intubated/sedated   Pulmonary:     Comments: Intubated/sedated  Abdominal:     Comments: RUQ drain to gravity. Approximately 25 ml of dark, thick bile in bag. Dressing is clean/dry/intact.  Flushes and aspirates easily. Gown and dressing soaked with serous fluid.   Musculoskeletal:     Comments: Anasarca      Imaging: IR Perc Cholecystostomy Result Date: 08/12/2023 INDICATION: 73 year old male with sepsis referred for percutaneous cholecystostomy EXAM: CHOLECYSTOSTOMY MEDICATIONS: None ANESTHESIA/SEDATION: Moderate (conscious) sedation was not employed during this procedure. The patient is intubated with fentanyl drip FLUOROSCOPY TIME:  Fluoroscopy Time:   (2.5 mGy). COMPLICATIONS: None PROCEDURE: Informed written consent was obtained from the patient's family after a thorough discussion of the procedural risks, benefits and alternatives. All questions were addressed. Maximal Sterile Barrier Technique was utilized including caps, mask, sterile gowns, sterile gloves, sterile drape, hand hygiene and skin antiseptic. A timeout was performed prior to the initiation of the procedure. Ultrasound survey of  the right upper quadrant was performed for planning purposes. Once the patient is prepped and draped in the usual sterile fashion, the skin and subcutaneous tissues overlying the gallbladder were generously infiltrated 1% lidocaine for local anesthesia. A coaxial needle was advanced under ultrasound guidance through the skin subcutaneous tissues and a small segment of liver into the gallbladder  lumen. With removal of the stylet, spontaneous dark bile drainage occurred. Using modified Seldinger technique, a 10 French drain was placed into the gallbladder fossa, with aspiration of the sample for the lab. Contrast injection confirmed position of the tube within the gallbladder lumen. Drainage catheter was attached to gravity drain with a suture retention placed. Patient tolerated the procedure well and remained hemodynamically stable throughout. No complications were encountered and no significant blood loss encountered. IMPRESSION: Status post image guided percutaneous cholecystostomy Signed, Yvone Neu. Miachel Roux, RPVI [facility] Radiology Electronically Signed   By: Gilmer Mor D.O.   On: 08/12/2023 14:14   CT HEAD WO CONTRAST ( ) Result Date: 08/11/2023 CLINICAL DATA:  Encephalopathy EXAM: CT HEAD WITHOUT CONTRAST TECHNIQUE: Contiguous axial images were obtained from the base of the skull through the vertex without intravenous contrast. RADIATION DOSE REDUCTION: This exam was performed according to the departmental dose-optimization program which includes automated exposure control, adjustment of the mA and/or kV according to patient size and/or use of iterative reconstruction technique. COMPARISON:  08/04/2023 FINDINGS: Brain: Diffuse cerebral atrophy. No acute intracranial abnormality. Specifically, no hemorrhage, hydrocephalus, mass lesion, acute infarction, or significant intracranial injury. Vascular: No hyperdense vessel or unexpected calcification. Skull: No acute calvarial abnormality. Sinuses/Orbits: No acute findings Other: None IMPRESSION: Atrophy.  No acute intracranial abnormality. Electronically Signed   By: Charlett Nose M.D.   On: 08/11/2023 19:31   CT ABDOMEN PELVIS W CONTRAST Result Date: 08/11/2023 CLINICAL DATA:  Sepsis EXAM: CT ABDOMEN AND PELVIS WITH CONTRAST TECHNIQUE: Multidetector CT imaging of the abdomen and pelvis was  performed using the standard protocol following bolus administration of intravenous contrast. RADIATION DOSE REDUCTION: This exam was performed according to the departmental dose-optimization program which includes automated exposure control, adjustment of the mA and/or kV according to patient size and/or use of iterative reconstruction technique. CONTRAST:  75mL OMNIPAQUE IOHEXOL 350 MG/ML SOLN COMPARISON:  08/07/2023 FINDINGS: Lower chest: See chest CT report from today. Hepatobiliary: Small layering stones within the gallbladder. No focal hepatic abnormality or biliary ductal dilatation. Pancreas: No focal abnormality or ductal dilatation. Spleen: 3.1 cm low-density lesion in the superior aspect of the spleen, unchanged. Normal size. Adrenals/Urinary Tract: Bilateral adrenal nodules again noted, unchanged. Mild left hydronephrosis is unchanged. Left ureter is decompressed. Findings compatible with chronic UPJ obstruction. No hydronephrosis on the right. Right upper pole renal cyst is stable. No follow-up imaging recommended. Urinary bladder decompressed with Foley catheter in place. Stomach/Bowel: Normal appendix. Scattered colonic diverticulosis. No active diverticulitis. Stomach and small bowel decompressed. NG tube tip is in the proximal duodenum. Vascular/Lymphatic: Aortic atherosclerosis. No evidence of aneurysm or adenopathy. Reproductive: Mild prostate hypertrophy. Surgical clips or radiation seeds adjacent to the prostate. Other: Small amount of free fluid in the pelvis. Diffuse edema throughout the subcutaneous soft tissues compatible with anasarca, worsening since prior study. Bilateral inguinal hernias containing fat. Musculoskeletal: No acute bony abnormality. IMPRESSION: No acute findings in the abdomen or pelvis. Stable chronic UPJ obstruction. Cholelithiasis. Stable low-density lesion in the superior aspect of the spleen with differential remaining the same. This could be further evaluated with non  emergent MRI. Small amount of free fluid in the pelvis. Aortoiliac atherosclerosis. Diffuse anasarca throughout the subcutaneous soft tissues. Electronically Signed   By: Charlett Nose M.D.   On: 08/11/2023 19:30   CT Angio Chest Pulmonary Embolism (PE) W or WO Contrast Result Date: 08/11/2023 CLINICAL DATA:  Pulmonary embolism (PE) suspected, low to intermediate prob, positive D-dimer. Hypoxia EXAM: CT ANGIOGRAPHY CHEST WITH CONTRAST TECHNIQUE: Multidetector CT imaging of the chest was performed using the standard protocol during bolus administration of intravenous contrast. Multiplanar CT image reconstructions and MIPs were obtained to evaluate the vascular anatomy. RADIATION DOSE REDUCTION: This exam was performed according to the departmental dose-optimization program which includes automated exposure control, adjustment of the mA and/or kV according to patient size and/or use of iterative reconstruction technique. CONTRAST:  75mL OMNIPAQUE IOHEXOL 350 MG/ML SOLN COMPARISON:  08/06/2023 FINDINGS: Cardiovascular: No filling defects in the pulmonary arteries to suggest pulmonary emboli. Heart is normal size. Aorta is normal caliber. Scattered coronary artery and aortic atherosclerosis. Mediastinum/Nodes: No mediastinal, hilar, or axillary adenopathy. Trachea and esophagus are unremarkable. Thyroid unremarkable. Endotracheal tube tip in the midtrachea. Lungs/Pleura: Small right pleural effusion with pleural thickening. Small left pleural effusion, new since prior study. Dependent atelectasis in the lower lobes. Scarring or atelectasis in the right middle lobe. Upper Abdomen: No acute findings. Musculoskeletal: Chest wall soft tissues are unremarkable. No acute bony abnormality. Review of the MIP images confirms the above findings. IMPRESSION: No evidence of pulmonary embolus. Small bilateral pleural effusions, new on the left since prior study. Associated right pleural thickening. Bibasilar atelectasis. Right  middle lobe atelectasis or scarring, similar to prior study. Coronary artery disease. Aortic Atherosclerosis (ICD10-I70.0). Electronically Signed   By: Charlett Nose M.D.   On: 08/11/2023 19:24   NM Hepatobiliary Liver Func Result Date: 08/11/2023 CLINICAL DATA:  Elevated bilirubin. Concern for biliary obstruction. EXAM: NUCLEAR MEDICINE HEPATOBILIARY IMAGING TECHNIQUE: Sequential images of the abdomen were obtained out to 60 minutes following intravenous administration of radiopharmaceutical. RADIOPHARMACEUTICALS:  5.1 mCi Tc-83m  Choletec IV COMPARISON:  Ultrasound 08/10/2023, CT 08/07/2023 FINDINGS: Delayed clearance of radiotracer from blood pool related to hyperbilirubinemia. Uniform uptake within the liver. No counts are excreted into the extrahepatic bile ducts over the course of 2 hour imaging. IMPRESSION: No radiotracer excreted into the bile ducts over 2 hours of imaging. Differential includes biliary obstruction versus hepatocellular disease such as hepatitis. Recommend clinical correlation. Electronically Signed   By: Genevive Bi M.D.   On: 08/11/2023 16:45   ECHOCARDIOGRAM LIMITED Result Date: 08/11/2023    ECHOCARDIOGRAM LIMITED REPORT   Patient Name:   CHRISTOPER BUSHEY Date of Exam: 08/11/2023 Medical Rec #:  [id number]        Height:       71.0 in Accession #:    [id number]       Weight:       227.1 lb Date of Birth:  26-May-1950        BSA:          2.225 m Patient Age:    72 years         BP:           143/54 mmHg Patient Gender: M                HR:           56 bpm. Exam Location:  Inpatient Procedure: Limited Echo, Color Doppler and Cardiac Doppler (Both Spectral and  Color Flow Doppler were utilized during procedure). Indications:    Shock  History:        Patient has prior history of Echocardiogram examinations.                 Stroke, Signs/Symptoms:Syncope; Risk Factors:Diabetes, Former                 Smoker and Sleep Apnea.  Sonographer:    Lamont Snowball Referring Phys:  [id number] Karl Ito IMPRESSIONS  1. Left ventricular ejection fraction, by estimation, is 55 to 60%. The left ventricle has normal function. The left ventricle has no regional wall motion abnormalities.  2. Mildly D-shaped septum suggests a degree of RV pressure/volume overload. Right ventricular systolic function is mildly reduced. The right ventricular size is mildly enlarged. Tricuspid regurgitation signal is inadequate for assessing PA pressure.  3. The mitral valve is normal in structure. Trivial mitral valve regurgitation. No evidence of mitral stenosis.  4. The aortic valve is tricuspid. There is moderate calcification of the aortic valve. Aortic valve regurgitation is not visualized. Aortic valve sclerosis/calcification is present, without any evidence of aortic stenosis. Aortic valve mean gradient measures 8.0 mmHg.  5. The inferior vena cava is dilated in size with <50% respiratory variability, suggesting right atrial pressure of 15 mmHg.  6. Limited echo FINDINGS  Left Ventricle: Left ventricular ejection fraction, by estimation, is 55 to 60%. The left ventricle has normal function. The left ventricle has no regional wall motion abnormalities. The left ventricular internal cavity size was normal in size. There is  no left ventricular hypertrophy. Right Ventricle: Mildly D-shaped septum suggests a degree of RV pressure/volume overload. The right ventricular size is mildly enlarged. No increase in right ventricular wall thickness. Right ventricular systolic function is mildly reduced. Tricuspid regurgitation signal is inadequate for assessing PA pressure. Left Atrium: Left atrial size was normal in size. Right Atrium: Right atrial size was normal in size. Pericardium: There is no evidence of pericardial effusion. Mitral Valve: The mitral valve is normal in structure. Mild mitral annular calcification. Trivial mitral valve regurgitation. No evidence of mitral valve stenosis. Tricuspid Valve: The  tricuspid valve is normal in structure. Tricuspid valve regurgitation is mild. Aortic Valve: The aortic valve is tricuspid. There is moderate calcification of the aortic valve. Aortic valve regurgitation is not visualized. Aortic valve sclerosis/calcification is present, without any evidence of aortic stenosis. Aortic valve mean gradient measures 8.0 mmHg. Aortic valve peak gradient measures 15.4 mmHg. Aortic valve area, by VTI measures 1.57 cm. Aorta: The aortic root is normal in size and structure. Venous: The inferior vena cava is dilated in size with less than 50% respiratory variability, suggesting right atrial pressure of 15 mmHg. LEFT VENTRICLE PLAX 2D LVIDd:         5.00 cm LVIDs:         3.30 cm LV PW:         0.80 cm LV IVS:        0.80 cm LVOT diam:     1.90 cm LV SV:         66 LV SV Index:   30 LVOT Area:     2.84 cm  IVC IVC diam: 2.50 cm AORTIC VALVE AV Area (Vmax):    1.50 cm AV Area (Vmean):   1.57 cm AV Area (VTI):     1.57 cm AV Vmax:           196.00 cm/s AV Vmean:  129.000 cm/s AV VTI:            0.420 m AV Peak Grad:      15.4 mmHg AV Mean Grad:      8.0 mmHg LVOT Vmax:         104.00 cm/s LVOT Vmean:        71.600 cm/s LVOT VTI:          0.232 m LVOT/AV VTI ratio: 0.55  AORTA Ao Root diam: 2.90 cm  SHUNTS Systemic VTI:  0.23 m Systemic Diam: 1.90 cm Dalton McleanMD Electronically signed by Wilfred Lacy Signature Date/Time: 08/11/2023/9:33:29 AM    Final    DG CHEST PORT 1 VIEW Result Date: 08/10/2023 CLINICAL DATA:  Hypoxia EXAM: PORTABLE CHEST 1 VIEW COMPARISON:  X-ray 08/08/2023 and older FINDINGS: Stable ET tube, enteric tube. Placement of a left-sided PICC with tip overlying the central SVC above the right atrium. Presumed loop recorder along the lower left hemithorax. Stable cardiopericardial silhouette. No pneumothorax. Slight increase in small right pleural effusion. Mild adjacent opacity is again noted. No edema. Prominence of the central vasculature. Overlapping  cardiac leads. IMPRESSION: New left-sided PICC with tip along the central SVC above the right atrium. Slight increase in tiny right pleural effusion. Electronically Signed   By: Karen Kays M.D.   On: 08/10/2023 16:00   US Abdomen Limited RUQ (LIVER/GB) Result Date: 08/10/2023 CLINICAL DATA:  Hyperbilirubinemia. EXAM: ULTRASOUND ABDOMEN LIMITED RIGHT UPPER QUADRANT COMPARISON:  08/06/2023 FINDINGS: Gallbladder: Dilated gallbladder with some borderline wall thickening of 3 mm. Several internal echoes consistent with sludge. No shadowing stones. Patient is on a ventilator. Common bile duct: Diameter: 6 mm Liver: No focal lesion identified. Within normal limits in parenchymal echogenicity. Portal vein is patent on color Doppler imaging with normal direction of blood flow towards the liver. Other: Trace ascites. IMPRESSION: Distended gallbladder with borderline wall thickening and sludge. No obvious stones or ductal dilatation. Trace ascites. Electronically Signed   By: Karen Kays M.D.   On: 08/10/2023 15:59    Labs:  CBC: Recent Labs    08/11/23 0037 08/11/23 0807 08/11/23 2304 08/12/23 0500 08/13/23 0422  WBC 16.5*  --  27.3* 25.7* 25.2*  HGB 7.0* 7.5* 8.0* 7.5* 7.6*  HCT 21.6* 22.0* 24.3* 22.7* 23.1*  PLT 57*  --  72* 73* 65*    COAGS: Recent Labs    04/06/23 1624 08/07/23 0243 08/08/23 0812 08/09/23 0533 08/10/23 0521 08/11/23 0037  INR 1.1   < > 1.4* 1.4* 1.4* 1.4*  APTT 30  --   --   --   --   --    < > = values in this interval not displayed.    BMP: Recent Labs    08/10/23 1620 08/10/23 2358 08/11/23 0037 08/11/23 0807 08/12/23 0500 08/13/23 0925  NA 135   < > 136 137 137 140  K 4.1   < > 4.0 3.8 3.8 3.5  CL 104  --  104  --  105 102  CO2 18*  --  14*  --  16* 20*  GLUCOSE 152*  --  107*  --  170* 212*  BUN 55*  --  54*  --  68* 89*  CALCIUM 7.7*  --  7.5*  --  7.7* 8.0*  CREATININE 1.30*  --  1.33*  --  1.37* 1.56*  GFRNONAA 58*  --  56*  --  54* 46*   <  > = values in this interval not displayed.  LIVER FUNCTION TESTS: Recent Labs    08/11/23 0037 08/11/23 0453 08/12/23 0500 08/13/23 0925  BILITOT 4.9* 5.1* 6.2* 10.7*  AST 135* 141* 134* 161*  ALT 106* 104* 77* 66*  ALKPHOS 135* 141* 143* 140*  PROT 3.0* 3.3* 3.9* 3.5*  ALBUMIN <1.5* <1.5* 2.2* 1.7*    Assessment and Plan:  Acute cholecystitis s/p percutaneous cholecystostomy in IR 08/12/23  Patient remains critically ill in the ICU. He is on the ventilator and still requiring pressor support with levophed. T. Bili worsening, now 14.5, WBC remains elevated at 26.8 and his creatinine level has also increased to 1.77. Lactic acid >9.   Drain Location: RUQ Size: Fr size: 10 Fr Date of placement: 08/13/23  Currently to: Drain collection device: gravity 24 hour output:  Output by Drain (mL) 08/11/23 0700 - 08/11/23 1459 08/11/23 1500 - 08/11/23 2259 08/11/23 2300 - 08/12/23 0659 08/12/23 0700 - 08/12/23 1459 08/12/23 1500 - 08/12/23 2259 08/12/23 2300 - 08/13/23 0659 08/13/23 0700 - 08/13/23 1459 08/13/23 1500 - 08/13/23 1511  Biliary Tube Cholecystostomy 10 Fr. RUQ     30 20      Interval imaging/drain manipulation:  none  Current examination: Suture and stat lock in place. Dressed appropriately.   Plan: Continue TID flushes with 5 cc NS. Record output Q shift. Dressing changes QD or PRN if soiled.  Call IR APP or on call IR MD if difficulty flushing or sudden change in drain output.   Patient with serous drainage from the insertion site related to his extensive anasarca.  Biliary drainage adeequately contained by gravity drainage system.  Tube appears to be functioning well although no improvement in clinical condition since placement.   Discharge planning: Percutaneous cholecystostomy drain to remain in place at least 6 weeks. Recommend fluoroscopy with injection of the drain in IR to evaluate for patency of the cystic duct.    IR will continue to follow - please call  with questions or concerns.  Electronically Signed: Loyce Dys, MS RD PA-C    I spent a total of 15 Minutes at the the patient's bedside AND on the patient's hospital floor or unit, greater than 50% of which was counseling/coordinating care for cholecystitis, sepsis.

## 2023-08-14 NOTE — TOC Progression Note (Signed)
 Transition of Care [facility]) - Progression Note    Patient Details  Name: HIDEO GOOGE MRN: [id number] Date of Birth: February 01, 1951  Transition of Care [facility]) CM/SW Contact  Mearl Latin, LCSW Phone Number: 08/14/2023, 9:05 AM  Clinical Narrative:    CSW continuing to follow.   Expected Discharge Plan: Skilled Nursing Facility Barriers to Discharge: Continued Medical Work up, SNF Pending bed offer  Expected Discharge Plan and Services In-house Referral: Clinical Social Work   Post Acute Care Choice: Skilled Nursing Facility Living arrangements for the past 2 months: Single Family Home                                       Social Determinants of Health (SDOH) Interventions SDOH Screenings   Food Insecurity: No Food Insecurity (08/01/2023)  Housing: Low Risk  (08/01/2023)  Transportation Needs: No Transportation Needs (08/01/2023)  Utilities: Not At Risk (08/01/2023)  Depression (PHQ2-9): Low Risk  (11/07/2022)  Social Connections: Socially Isolated (08/01/2023)  Tobacco Use: Medium Risk (08/01/2023)    Readmission Risk Interventions     No data to display

## 2023-08-14 NOTE — Progress Notes (Signed)
 STROKE TEAM PROGRESS NOTE   INTERIM HISTORY/SUBJECTIVE Wife, sister, brother are at the bedside.  Patient lying in bed, still intubated, more lethargic than yesterday, eyes closed but open on voice with repetitive stimulation. Not tracking or following commands. Platelet down to 34, still worsening leukocytosis, AKI and significant elevated LA. 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 DIC panel also sent out.    OBJECTIVE  CBC    Component Value Date/Time   WBC 26.8 (H) 08/14/2023 0409   RBC 2.78 (L) 08/14/2023 0409   HGB 8.0 (L) 08/14/2023 0409   HGB 12.3 (L) 05/01/2023 1204   HCT 24.0 (L) 08/14/2023 0409   PLT 34 (L) 08/14/2023 0409   PLT 249 05/01/2023 1204   MCV 86.3 08/14/2023 0409   MCH 28.8 08/14/2023 0409   MCHC 33.3 08/14/2023 0409   RDW 16.3 (H) 08/14/2023 0409   LYMPHSABS 0.3 (L) 08/14/2023 0409   MONOABS 0.3 08/14/2023 0409   EOSABS 0.0 08/14/2023 0409   BASOSABS 0.0 08/14/2023 0409    BMET    Component Value Date/Time   NA 139 08/14/2023 0409   NA 142 04/06/2017 1057   K 3.5 08/14/2023 0409   CL 99 08/14/2023 0409   CO2 18 (L) 08/14/2023 0409   GLUCOSE 253 (H) 08/14/2023 0409   BUN 108 (H) 08/14/2023 0409   BUN 13 04/06/2017 1057   CREATININE 1.77 (H) 08/14/2023 0409   CREATININE 0.94 05/01/2023 1204   CALCIUM 8.1 (L) 08/14/2023 0409   GFRNONAA 40 (L) 08/14/2023 0409   GFRNONAA >60 05/01/2023 1204    IMAGING past 24 hours No results found.    Vitals:   08/14/23 0900 08/14/23 1000 08/14/23 1100 08/14/23 1101  BP: (!) 140/61 (!) 145/59 (!) 141/61   Pulse: 79 82 84   Resp: 20 19 (!) 21   Temp:      TempSrc:      SpO2: 98% 98% 98% 98%  Weight:      Height:         PHYSICAL EXAM  Temp:  [98.4 F (36.9 C)-99 F (37.2 C)] 99 F (37.2 C) (04/04 0800) Pulse Rate:  [59-84] 84 (04/04 1100) Resp:  [14-47] 21 (04/04 1100) BP: (127-158)/(52-65) 141/61 (04/04 1100) SpO2:  [98 %-100 %] 98 % (04/04 1101) Arterial Line BP:  (108-136)/(40-50) 122/48 (04/04 1000) FiO2 (%):  [40 %] 40 % (04/04 1101)  General - Well nourished, well developed,  off sedation.  Ophthalmologic - fundi not visualized due to noncooperation.  Cardiovascular - Regular rate and rhythm.  Extremities - extensive peripheral edema  Neuro - intubated off sedation, eyes closed but briefly open on voice, still not following commands. Eyes in mid position but not tracking to voice today, not blinking to visual threat, pupils equal size light reflexes sluggish. Corneal reflex present bilaterally, gag and cough present. Breathing over the vent.  Facial symmetry not able to test due to ET tube.  Tongue protrusion not cooperative. On pain stimulation, no movement in all extremities. Severe edema in all extremities. Sensation, coordination and gait not tested.    ASSESSMENT/PLAN  [name] is a 73 y.o. male with history of prior history of cryptogenic strokes, diabetes, hyperlipidemia, hypertension, iron deficiency anemia, OSA, prostate cancer admitted for gradually progressive weakness and poor nutrition intake.    Stroke:  b/l small bilateral multifocal embolic infarcts, etiology: possible from primary hypercoagulable state CT head No acute abnormality.  MRI  Many small acute infarcts in the  bilateral frontal and parietal lobes, left thalamus, right occipital lobe, and cerebellum MRA iron [place] MRI repeat new small left pontine acute infarct.  Possible small left frontal cortex new infarct.  Stable subacute infarcts seen on the last MRI 2D Echo EF 60-65%.  LA size normal. CT chest abdomen pelvis no evidence of malignancy  Loop recorder interrogation negative for paroxysmal A-fib  LDL 47 HgbA1c 5.3 EEG diffuse slowing.  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 Off ASA due to  thrombocytopenia Therapy recommendations: SNF Disposition: Pending, palliative care on board, wife seems would accept  comfort care if condition not getting better.  Respiratory failure Aspiration Sepsis / septic shock Intubated for airway protection CCM on board Vent management per CCM On Zosyn and vancomycin Stat post percutaneous cholecystectomy with drainage Leukocytosis WBC 15.0->16.6->16.5->25.7-> 25.2->26.8 LA 4.8->6.0->6.6->8.0->8.2-> >9.0-> >9.0   Hx of Stroke/TIA 09/2022 admitted for bilateral ACA infarct, left more than right. CTA head and neck showed right A3 stenosis. EF 55 to 60%, no DVT. LDL 45, A1c 5.6. Discharged on DAPT and Lipitor 40 and Zetia. Loop recorder placed.  03/2023 admitted for small acute to subacute infarct on the left frontal and left parietal lobes, and right temporal white matter.  CTA head and neck similar diminutive distal right ACA.  EF 60 to 65%.  Loop recorder no A-fib.  LDL 47, A1c 8.2.  Discharged on aspirin and Brilinta for 30 days and then Plavix alone.  Also discharged on Lipitor and Zetia.  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 Dr. 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 Cord T2 hyperintensity superior to this level, suspicious for edema and compressive myelopathy. Severe left and moderate right foraminal stenosis at this level. MRI T-spine Edema involving the left T1 inferior articular facet Neurosurgery consulted, recommend cervical surgery once medically stable.   Off aspirin 81 due to  continued thrombocytopenia, has been off brilinta  Hx of hypertension Now hypotension with septic shock Home meds: Lotensin 40 mg Hygroton 25 mg Now on Levophed, off neo and vasopressin Long-term BP goal normotensive  Hyperlipidemia Home meds: Atorvastatin 40 mg and Zetia 10 mg LDL 47, goal < 70 Statin and zetia are on hold given acute liver injury Continue statin once LFTs normalize  Diabetes type II Controlled Home meds: Metformin, Toujeo HgbA1c 5.3, goal < 7.0 CBGs SSI Recommend close follow-up with PCP for better DM control  Acute liver injury AST/ALT 515/469--376/361--172/240--110/138--141/104--134/77--161/66--134/58 INR 1.4--1.3--1.4--1.4 Close monitoring Management per CCM  Anemia and thrombocytopenia ? 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 Hemoglobin 8.9--8.5--8.1--7.8--8.4--7.5--6.5--PRBC--7.5--7.6--8.0 Platelet 97--91--89--71--64--57->73-> 65->34 Check DIC panel and  HIT antibodies Now on argatroban to replace heparin   Other Stroke Risk Factors Obstructive sleep apnea, not on CPAP at home Advanced age  Other Active Problems BPH Depression, psychiatry on board B12 deficiency Cognitive impairment AKI, Cre 1.46--1.33--1.37--1.56--1.77   Hospital day # 13  This patient is critically ill due to embolic stroke, hypercoagulable state, respiratory  failure, and at significant risk of neurological worsening, death form further stroke, hemorrhagic transformation, PE/DVT. This patient's care requires constant monitoring of vital signs, hemodynamics, respiratory and cardiac monitoring, review of multiple databases, neurological assessment, discussion with family, other specialists and medical decision making of high complexity. I spent 45 minutes of neurocritical care time in the care of this patient.  I had long discussion with wife and sister at bedside, updated pt current condition, treatment plan and potential prognosis, and answered all the questions.  They expressed understanding and  appreciation. I also discussed with Dr. Celine Mans CCM.  Marvel Plan, MD PhD Stroke Neurology 08/14/2023 11:44 AM    To contact Stroke Continuity provider, please refer to [email or web address]. After hours, contact General Neurology

## 2023-08-15 DIAGNOSIS — J690 Pneumonitis due to inhalation of food and vomit: Secondary | ICD-10-CM | POA: Diagnosis not present

## 2023-08-15 DIAGNOSIS — K7200 Acute and subacute hepatic failure without coma: Secondary | ICD-10-CM | POA: Diagnosis not present

## 2023-08-15 DIAGNOSIS — I6389 Other cerebral infarction: Secondary | ICD-10-CM | POA: Diagnosis not present

## 2023-08-15 DIAGNOSIS — I959 Hypotension, unspecified: Secondary | ICD-10-CM

## 2023-08-15 DIAGNOSIS — A419 Sepsis, unspecified organism: Secondary | ICD-10-CM | POA: Diagnosis not present

## 2023-08-15 DIAGNOSIS — I480 Paroxysmal atrial fibrillation: Secondary | ICD-10-CM

## 2023-08-15 DIAGNOSIS — J9690 Respiratory failure, unspecified, unspecified whether with hypoxia or hypercapnia: Secondary | ICD-10-CM

## 2023-08-15 DIAGNOSIS — J9601 Acute respiratory failure with hypoxia: Secondary | ICD-10-CM | POA: Diagnosis not present

## 2023-08-15 DIAGNOSIS — R6521 Severe sepsis with septic shock: Secondary | ICD-10-CM | POA: Diagnosis not present

## 2023-08-15 LAB — CBC WITH DIFFERENTIAL/PLATELET
Abs Immature Granulocytes: 4.88 10*3/uL — ABNORMAL HIGH (ref 0.00–0.07)
Basophils Absolute: 0.2 10*3/uL — ABNORMAL HIGH (ref 0.0–0.1)
Basophils Relative: 0 %
Eosinophils Absolute: 0 10*3/uL (ref 0.0–0.5)
Eosinophils Relative: 0 %
HCT: 23.3 % — ABNORMAL LOW (ref 39.0–52.0)
Hemoglobin: 6.9 g/dL — CL (ref 13.0–17.0)
Immature Granulocytes: 10 %
Lymphocytes Relative: 8 %
Lymphs Abs: 4 10*3/uL (ref 0.7–4.0)
MCH: 29 pg (ref 26.0–34.0)
MCHC: 29.6 g/dL — ABNORMAL LOW (ref 30.0–36.0)
MCV: 97.9 fL (ref 80.0–100.0)
Monocytes Absolute: 9.3 10*3/uL — ABNORMAL HIGH (ref 0.1–1.0)
Monocytes Relative: 18 %
Neutro Abs: 32.7 10*3/uL — ABNORMAL HIGH (ref 1.7–7.7)
Neutrophils Relative %: 64 %
Platelets: 23 10*3/uL — CL (ref 150–400)
RBC: 2.38 MIL/uL — ABNORMAL LOW (ref 4.22–5.81)
RDW: 17.5 % — ABNORMAL HIGH (ref 11.5–15.5)
Smear Review: DECREASED
WBC: 51.1 10*3/uL (ref 4.0–10.5)
nRBC: 4.1 % — ABNORMAL HIGH (ref 0.0–0.2)

## 2023-08-15 LAB — CULTURE, BLOOD (ROUTINE X 2)
Culture: NO GROWTH
Culture: NO GROWTH

## 2023-08-15 LAB — POCT I-STAT 7, (LYTES, BLD GAS, ICA,H+H)
Acid-base deficit: 17 mmol/L — ABNORMAL HIGH (ref 0.0–2.0)
Bicarbonate: 12.1 mmol/L — ABNORMAL LOW (ref 20.0–28.0)
Calcium, Ion: 0.97 mmol/L — ABNORMAL LOW (ref 1.15–1.40)
HCT: 19 % — ABNORMAL LOW (ref 39.0–52.0)
Hemoglobin: 6.5 g/dL — CL (ref 13.0–17.0)
O2 Saturation: 90 %
Patient temperature: 99.1
Potassium: 5.2 mmol/L — ABNORMAL HIGH (ref 3.5–5.1)
Sodium: 137 mmol/L (ref 135–145)
TCO2: 13 mmol/L — ABNORMAL LOW (ref 22–32)
pCO2 arterial: 41.8 mmHg (ref 32–48)
pH, Arterial: 7.07 — CL (ref 7.35–7.45)
pO2, Arterial: 83 mmHg (ref 83–108)

## 2023-08-15 LAB — COMPREHENSIVE METABOLIC PANEL WITH GFR
ALT: 69 U/L — ABNORMAL HIGH (ref 0–44)
AST: 253 U/L — ABNORMAL HIGH (ref 15–41)
Albumin: <1.5 g/dL — ABNORMAL LOW (ref 3.5–5.0)
Alkaline Phosphatase: 146 U/L — ABNORMAL HIGH (ref 38–126)
Anion gap: 28 — ABNORMAL HIGH (ref 5–15)
BUN: 138 mg/dL — ABNORMAL HIGH (ref 8–23)
CO2: 13 mmol/L — ABNORMAL LOW (ref 22–32)
Calcium: 7.7 mg/dL — ABNORMAL LOW (ref 8.9–10.3)
Chloride: 101 mmol/L (ref 98–111)
Creatinine, Ser: 2.65 mg/dL — ABNORMAL HIGH (ref 0.61–1.24)
GFR, Estimated: 25 mL/min — ABNORMAL LOW (ref >60)
Glucose, Bld: 253 mg/dL — ABNORMAL HIGH (ref 70–99)
Potassium: 5.6 mmol/L — ABNORMAL HIGH (ref 3.5–5.1)
Sodium: 142 mmol/L (ref 135–145)
Total Bilirubin: 16.7 mg/dL — ABNORMAL HIGH (ref 0.0–1.2)
Total Protein: <3 g/dL — ABNORMAL LOW (ref 6.5–8.1)

## 2023-08-15 LAB — PROTIME-INR
INR: 5.2 (ref 0.8–1.2)
Prothrombin Time: 47.8 s — ABNORMAL HIGH (ref 11.4–15.2)

## 2023-08-15 LAB — LACTIC ACID, PLASMA: Lactic Acid, Venous: >9 mmol/L (ref 0.5–1.9)

## 2023-08-15 LAB — GLUCOSE, CAPILLARY
Glucose-Capillary: 238 mg/dL — ABNORMAL HIGH (ref 70–99)
Glucose-Capillary: 226 mg/dL — ABNORMAL HIGH (ref 70–99)

## 2023-08-15 LAB — HEPARIN INDUCED PLATELET AB (HIT ANTIBODY): Heparin Induced Plt Ab: 0.086 {OD_unit} (ref 0.000–0.400)

## 2023-08-15 MED ORDER — POLYVINYL ALCOHOL 1.4 % OP SOLN: 1.0000 [drp] | Freq: Four times a day (QID) | OPHTHALMIC | Status: DC | PRN

## 2023-08-15 MED ORDER — PHENYLEPHRINE HCL-NACL 20-0.9 MG/250ML-% IV SOLN
0.0000 ug/min | INTRAVENOUS | Status: DC
  Filled 2023-08-15: qty 250

## 2023-08-15 MED ORDER — GLYCOPYRROLATE 1 MG PO TABS: 1.0000 mg | ORAL_TABLET | ORAL | Status: DC | PRN

## 2023-08-15 MED ORDER — SODIUM BICARBONATE 8.4 % IV SOLN
INTRAVENOUS | Status: AC
  Filled 2023-08-15: qty 100

## 2023-08-15 MED ORDER — GLYCOPYRROLATE 0.2 MG/ML IJ SOLN
0.2000 mg | INTRAMUSCULAR | Status: DC | PRN
  Administered 2023-08-15: 0.2 mg via INTRAVENOUS
  Filled 2023-08-15: qty 1

## 2023-08-15 MED ORDER — PHENYLEPHRINE HCL-NACL 20-0.9 MG/250ML-% IV SOLN
INTRAVENOUS | Status: AC
  Administered 2023-08-15: 20 ug/min via INTRAVENOUS
  Filled 2023-08-15: qty 250

## 2023-08-15 MED ORDER — SODIUM BICARBONATE 8.4 % IV SOLN
50.0000 meq | Freq: Once | INTRAVENOUS | Status: AC
  Administered 2023-08-15: 50 meq via INTRAVENOUS

## 2023-08-15 MED ORDER — SODIUM BICARBONATE 8.4 % IV SOLN
100.0000 meq | Freq: Once | INTRAVENOUS | Status: AC
  Administered 2023-08-15: 100 meq via INTRAVENOUS

## 2023-08-15 MED ORDER — ACETAMINOPHEN 325 MG PO TABS: 650.0000 mg | ORAL_TABLET | Freq: Four times a day (QID) | ORAL | Status: DC | PRN

## 2023-08-15 MED ORDER — GLYCOPYRROLATE 0.2 MG/ML IJ SOLN: 0.2000 mg | INTRAMUSCULAR | Status: DC | PRN

## 2023-08-15 MED ORDER — LORAZEPAM 2 MG/ML IJ SOLN: 2.0000 mg | INTRAMUSCULAR | Status: DC | PRN

## 2023-08-15 MED ORDER — SODIUM CHLORIDE 0.9% FLUSH: 3.0000 mL | Freq: Two times a day (BID) | INTRAVENOUS | Status: DC

## 2023-08-15 MED ORDER — MORPHINE BOLUS VIA INFUSION
5.0000 mg | INTRAVENOUS | Status: DC | PRN
  Administered 2023-08-15 (×4): 5 mg via INTRAVENOUS

## 2023-08-15 MED ORDER — MORPHINE 100MG IN NS 100ML (1MG/ML) PREMIX INFUSION
0.0000 mg/h | INTRAVENOUS | Status: DC
Start: 2023-08-15 — End: 2023-08-15
  Administered 2023-08-15: 5 mg/h via INTRAVENOUS
  Filled 2023-08-15: qty 100

## 2023-08-15 MED ORDER — SODIUM CHLORIDE 0.9% FLUSH: 3.0000 mL | INTRAVENOUS | Status: DC | PRN

## 2023-08-15 MED ORDER — ACETAMINOPHEN 650 MG RE SUPP: 650.0000 mg | Freq: Four times a day (QID) | RECTAL | Status: DC | PRN

## 2023-08-17 LAB — AEROBIC/ANAEROBIC CULTURE W GRAM STAIN (SURGICAL/DEEP WOUND)
Culture: NO GROWTH
Gram Stain: NONE SEEN

## 2023-08-17 LAB — PATHOLOGIST SMEAR REVIEW

## 2023-08-18 LAB — SEROTONIN RELEASE ASSAY (SRA)
SRA .2 IU/mL UFH Ser-aCnc: 1 % (ref 0–20)
SRA 100IU/mL UFH Ser-aCnc: 1 % (ref 0–20)

## 2023-08-24 ENCOUNTER — Ambulatory Visit: Payer: Medicare Other | Admitting: Neurology

## 2023-09-01 ENCOUNTER — Ambulatory Visit: Admitting: Nurse Practitioner

## 2023-09-10 NOTE — Death Summary Note (Signed)
 DEATH SUMMARY   Patient Details  Name: Kenneth [name] MRN: [id number] DOB: May 27, 1950  Admission/Discharge Information   Admit Date:  30-Aug-2023  Date of Death: Date of Death: 09/13/23  Time of Death: Time of Death: 0935  Length of Stay: Sep 22, 2023  Referring Physician: [name], MD   Reason(s) for Hospitalization  confusion  Diagnoses  Preliminary cause of death: Acute Ischemic Strokes Secondary Diagnoses (including complications and co-morbidities):  Principal Problem:   Symptomatic anemia Active Problems:   Weakness   Thrombocytopenia (HCC)   History of stroke   Preop cardiovascular exam   Hypercoagulable state (HCC)   Protein-calorie malnutrition, severe   Splenic lesion   Anemia   Shock (HCC)   Hyperbilirubinemia   Acute respiratory failure with hypoxia (HCC)   On mechanically assisted ventilation (HCC)   Coagulopathy [facility])   Brief Hospital Course (including significant findings, care, treatment, and services provided and events leading to death)  Kenneth [name] was a 73 y.o. 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    Admitted to hospital medicine service initially, but had new onset confusion on 3/25. CT head completed 3/25 with NAICA, new small chronic cortical/ subcortical infarct R frontal lobe ( new from prior), known small chronic infarcts, L frontal/ L parietal/ R temporal lobes. EEG (3/25) showed mild diffuse encephalopathy, no seizures. F/u MRI brain (3/25) showed many small acute infarcts concerning for embolic etiology. 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 MRI T-spine negative. Given patients progressive weakness in the setting of known C-Spine compression, NSG consulted for evaluation 3/26 with recommendation for eventual cord decompression once optimized from a medical standpoint. Additionally, psychiatry was consulted for recommendations on medication management for depressed mood. Cardiology was consulted 3/27 for preoperative cardiac risk stratification.   Hospital course complicated by sepsis, aspiration pneumonia, respiratory failure. Developed subsequent multi-organ failure despite aggressive life sustaining measures. On the evening of 4/4 developed refractory septic shock and hypoxemia, acute liver failure. Family called in and transitioned to comfort measures. He passed away peacefully with family at bedside.  Pertinent Labs and Studies  Significant Diagnostic Studies DG CHEST PORT 1 VIEW Result Date: 08/14/2023 CLINICAL DATA:  Acute respiratory failure, hypoxia EXAM: PORTABLE CHEST 1 VIEW COMPARISON:  08/10/2023 FINDINGS: Single frontal view of the chest demonstrates stable endotracheal tube, enteric catheter, left-sided central venous catheter, and loop recorder. Cardiac silhouette is unremarkable. Trace right pleural effusion. Streaky consolidation at the lung bases consistent with atelectasis. No pneumothorax. No acute bony abnormalities. IMPRESSION: 1. Support devices as above. 2. Stable small right pleural effusion and bibasilar atelectasis. Electronically Signed   By: Sharlet Salina M.D.   On: 08/14/2023 18:35   IR Perc Cholecystostomy Result Date: 08/12/2023 INDICATION: 73 year old male with sepsis referred for percutaneous cholecystostomy EXAM: CHOLECYSTOSTOMY MEDICATIONS: None ANESTHESIA/SEDATION: Moderate (conscious) sedation was not employed during this procedure. The patient is intubated with fentanyl drip FLUOROSCOPY TIME:  Fluoroscopy Time:   (2.5 mGy). COMPLICATIONS: None  PROCEDURE: Informed written consent was obtained from the patient's family after a thorough discussion of the procedural risks, benefits and alternatives. All questions were addressed.  Maximal Sterile Barrier Technique was utilized including caps, mask, sterile gowns, sterile gloves, sterile drape, hand hygiene and skin antiseptic. A timeout was performed prior to the initiation of the procedure. Ultrasound survey of the right upper quadrant was performed for planning purposes. Once the patient is prepped and draped in the usual sterile fashion, the skin and subcutaneous tissues overlying the gallbladder were generously infiltrated 1% lidocaine for local anesthesia. A coaxial needle was advanced under ultrasound guidance through the skin subcutaneous tissues and a small segment of liver into the gallbladder lumen. With removal of the stylet, spontaneous dark bile drainage occurred. Using modified Seldinger technique, a 10 French drain was placed into the gallbladder fossa, with aspiration of the sample for the lab. Contrast injection confirmed position of the tube within the gallbladder lumen. Drainage catheter was attached to gravity drain with a suture retention placed. Patient tolerated the procedure well and remained hemodynamically stable throughout. No complications were encountered and no significant blood loss encountered. IMPRESSION: Status post image guided percutaneous cholecystostomy Signed, Yvone Neu. Miachel Roux, RPVI [facility] Electronically Signed   By: Gilmer Mor D.O.   On: 08/12/2023 14:14   CT HEAD WO CONTRAST ( ) Result Date: 08/11/2023 CLINICAL DATA:  Encephalopathy EXAM: CT HEAD WITHOUT CONTRAST TECHNIQUE: Contiguous axial images were obtained from the base of the skull through the vertex without intravenous contrast. RADIATION DOSE REDUCTION: This exam was performed according to the departmental dose-optimization program  which includes automated exposure control, adjustment of the mA and/or kV according to patient size and/or use of iterative reconstruction technique. COMPARISON:  08/04/2023 FINDINGS: Brain: Diffuse cerebral atrophy. No acute intracranial abnormality. Specifically, no hemorrhage, hydrocephalus, mass lesion, acute infarction, or significant intracranial injury. Vascular: No hyperdense vessel or unexpected calcification. Skull: No acute calvarial abnormality. Sinuses/Orbits: No acute findings Other: None IMPRESSION: Atrophy.  No acute intracranial abnormality. Electronically Signed   By: Charlett Nose M.D.   On: 08/11/2023 19:31   CT ABDOMEN PELVIS W CONTRAST Result Date: 08/11/2023 CLINICAL DATA:  Sepsis EXAM: CT ABDOMEN AND PELVIS WITH CONTRAST TECHNIQUE: Multidetector CT imaging of the abdomen and pelvis was performed using the standard protocol following bolus administration of intravenous contrast. RADIATION DOSE REDUCTION: This exam was performed according to the departmental dose-optimization program which includes automated exposure control, adjustment of the mA and/or kV according to patient size and/or use of iterative reconstruction technique. CONTRAST:  75mL OMNIPAQUE IOHEXOL 350 MG/ML SOLN COMPARISON:  08/07/2023 FINDINGS: Lower chest: See chest CT report from today. Hepatobiliary: Small layering stones within the gallbladder. No focal hepatic abnormality or biliary ductal dilatation. Pancreas: No focal abnormality or ductal dilatation. Spleen: 3.1 cm low-density lesion in the superior aspect of the spleen, unchanged. Normal size. Adrenals/Urinary Tract: Bilateral adrenal nodules again noted, unchanged. Mild left hydronephrosis is unchanged. Left ureter is decompressed. Findings compatible with chronic UPJ obstruction. No hydronephrosis on the right. Right upper pole renal cyst is stable. No follow-up imaging recommended. Urinary bladder decompressed with Foley catheter in place. Stomach/Bowel: Normal  appendix. Scattered colonic diverticulosis. No active diverticulitis. Stomach and small bowel decompressed. NG tube tip is in the proximal duodenum. Vascular/Lymphatic: Aortic atherosclerosis. No evidence of aneurysm or adenopathy. Reproductive: Mild prostate hypertrophy. Surgical clips or radiation seeds adjacent to the prostate. Other: Small amount of free fluid in the pelvis. Diffuse edema throughout the subcutaneous soft tissues compatible with anasarca, worsening since prior study. Bilateral inguinal hernias containing fat. Musculoskeletal: No acute bony abnormality. IMPRESSION: No acute  findings in the abdomen or pelvis. Stable chronic UPJ obstruction. Cholelithiasis. Stable low-density lesion in the superior aspect of the spleen with differential remaining the same. This could be further evaluated with non emergent MRI. Small amount of free fluid in the pelvis. Aortoiliac atherosclerosis. Diffuse anasarca throughout the subcutaneous soft tissues. Electronically Signed   By: Charlett Nose M.D.   On: 08/11/2023 19:30   CT Angio Chest Pulmonary Embolism (PE) W or WO Contrast Result Date: 08/11/2023 CLINICAL DATA:  Pulmonary embolism (PE) suspected, low to intermediate prob, positive D-dimer. Hypoxia EXAM: CT ANGIOGRAPHY CHEST WITH CONTRAST TECHNIQUE: Multidetector CT imaging of the chest was performed using the standard protocol during bolus administration of intravenous contrast. Multiplanar CT image reconstructions and MIPs were obtained to evaluate the vascular anatomy. RADIATION DOSE REDUCTION: This exam was performed according to the departmental dose-optimization program which includes automated exposure control, adjustment of the mA and/or kV according to patient size and/or use of iterative reconstruction technique. CONTRAST:  75mL OMNIPAQUE IOHEXOL 350 MG/ML SOLN COMPARISON:  08/06/2023 FINDINGS: Cardiovascular: No filling defects in the pulmonary arteries to suggest pulmonary emboli. Heart is normal  size. Aorta is normal caliber. Scattered coronary artery and aortic atherosclerosis. Mediastinum/Nodes: No mediastinal, hilar, or axillary adenopathy. Trachea and esophagus are unremarkable. Thyroid unremarkable. Endotracheal tube tip in the midtrachea. Lungs/Pleura: Small right pleural effusion with pleural thickening. Small left pleural effusion, new since prior study. Dependent atelectasis in the lower lobes. Scarring or atelectasis in the right middle lobe. Upper Abdomen: No acute findings. Musculoskeletal: Chest wall soft tissues are unremarkable. No acute bony abnormality. Review of the MIP images confirms the above findings. IMPRESSION: No evidence of pulmonary embolus. Small bilateral pleural effusions, new on the left since prior study. Associated right pleural thickening. Bibasilar atelectasis. Right middle lobe atelectasis or scarring, similar to prior study. Coronary artery disease. Aortic Atherosclerosis (ICD10-I70.0). Electronically Signed   By: Charlett Nose M.D.   On: 08/11/2023 19:24   NM Hepatobiliary Liver Func Result Date: 08/11/2023 CLINICAL DATA:  Elevated bilirubin. Concern for biliary obstruction. EXAM: NUCLEAR MEDICINE HEPATOBILIARY IMAGING TECHNIQUE: Sequential images of the abdomen were obtained out to 60 minutes following intravenous administration of radiopharmaceutical. RADIOPHARMACEUTICALS:  5.1 mCi Tc-18m  Choletec IV COMPARISON:  Ultrasound 08/10/2023, CT 08/07/2023 FINDINGS: Delayed clearance of radiotracer from blood pool related to hyperbilirubinemia. Uniform uptake within the liver. No counts are excreted into the extrahepatic bile ducts over the course of 2 hour imaging. IMPRESSION: No radiotracer excreted into the bile ducts over 2 hours of imaging. Differential includes biliary obstruction versus hepatocellular disease such as hepatitis. Recommend clinical correlation. Electronically Signed   By: Genevive Bi M.D.   On: 08/11/2023 16:45   ECHOCARDIOGRAM LIMITED Result  Date: 08/11/2023    ECHOCARDIOGRAM LIMITED REPORT   Patient Name:   LEONARD FEIGEL Date of Exam: 08/11/2023 Medical Rec #:  [id number]        Height:       71.0 in Accession #:    [id number]       Weight:       227.1 lb Date of Birth:  1951-01-01        BSA:          2.225 m Patient Age:    72 years         BP:           143/54 mmHg Patient Gender: M                HR:  56 bpm. Exam Location:  Inpatient Procedure: Limited Echo, Color Doppler and Cardiac Doppler (Both Spectral and            Color Flow Doppler were utilized during procedure). Indications:    Shock  History:        Patient has prior history of Echocardiogram examinations.                 Stroke, Signs/Symptoms:Syncope; Risk Factors:Diabetes, Former                 Smoker and Sleep Apnea.  Sonographer:    Lamont Snowball Referring Phys: [id number] Karl Ito IMPRESSIONS  1. Left ventricular ejection fraction, by estimation, is 55 to 60%. The left ventricle has normal function. The left ventricle has no regional wall motion abnormalities.  2. Mildly D-shaped septum suggests a degree of RV pressure/volume overload. Right ventricular systolic function is mildly reduced. The right ventricular size is mildly enlarged. Tricuspid regurgitation signal is inadequate for assessing PA pressure.  3. The mitral valve is normal in structure. Trivial mitral valve regurgitation. No evidence of mitral stenosis.  4. The aortic valve is tricuspid. There is moderate calcification of the aortic valve. Aortic valve regurgitation is not visualized. Aortic valve sclerosis/calcification is present, without any evidence of aortic stenosis. Aortic valve mean gradient measures 8.0 mmHg.  5. The inferior vena cava is dilated in size with <50% respiratory variability, suggesting right atrial pressure of 15 mmHg.  6. Limited echo FINDINGS  Left Ventricle: Left ventricular ejection fraction, by estimation, is 55 to 60%. The left ventricle has normal function. The left  ventricle has no regional wall motion abnormalities. The left ventricular internal cavity size was normal in size. There is  no left ventricular hypertrophy. Right Ventricle: Mildly D-shaped septum suggests a degree of RV pressure/volume overload. The right ventricular size is mildly enlarged. No increase in right ventricular wall thickness. Right ventricular systolic function is mildly reduced. Tricuspid regurgitation signal is inadequate for assessing PA pressure. Left Atrium: Left atrial size was normal in size. Right Atrium: Right atrial size was normal in size. Pericardium: There is no evidence of pericardial effusion. Mitral Valve: The mitral valve is normal in structure. Mild mitral annular calcification. Trivial mitral valve regurgitation. No evidence of mitral valve stenosis. Tricuspid Valve: The tricuspid valve is normal in structure. Tricuspid valve regurgitation is mild. Aortic Valve: The aortic valve is tricuspid. There is moderate calcification of the aortic valve. Aortic valve regurgitation is not visualized. Aortic valve sclerosis/calcification is present, without any evidence of aortic stenosis. Aortic valve mean gradient measures 8.0 mmHg. Aortic valve peak gradient measures 15.4 mmHg. Aortic valve area, by VTI measures 1.57 cm. Aorta: The aortic root is normal in size and structure. Venous: The inferior vena cava is dilated in size with less than 50% respiratory variability, suggesting right atrial pressure of 15 mmHg. LEFT VENTRICLE PLAX 2D LVIDd:         5.00 cm LVIDs:         3.30 cm LV PW:         0.80 cm LV IVS:        0.80 cm LVOT diam:     1.90 cm LV SV:         66 LV SV Index:   30 LVOT Area:     2.84 cm  IVC IVC diam: 2.50 cm AORTIC VALVE AV Area (Vmax):    1.50 cm AV Area (Vmean):   1.57 cm AV Area (VTI):  1.57 cm AV Vmax:           196.00 cm/s AV Vmean:          129.000 cm/s AV VTI:            0.420 m AV Peak Grad:      15.4 mmHg AV Mean Grad:      8.0 mmHg LVOT Vmax:          104.00 cm/s LVOT Vmean:        71.600 cm/s LVOT VTI:          0.232 m LVOT/AV VTI ratio: 0.55  AORTA Ao Root diam: 2.90 cm  SHUNTS Systemic VTI:  0.23 m Systemic Diam: 1.90 cm Dalton McleanMD Electronically signed by Wilfred Lacy Signature Date/Time: 08/11/2023/9:33:29 AM    Final    DG CHEST PORT 1 VIEW Result Date: 08/10/2023 CLINICAL DATA:  Hypoxia EXAM: PORTABLE CHEST 1 VIEW COMPARISON:  X-ray 08/08/2023 and older FINDINGS: Stable ET tube, enteric tube. Placement of a left-sided PICC with tip overlying the central SVC above the right atrium. Presumed loop recorder along the lower left hemithorax. Stable cardiopericardial silhouette. No pneumothorax. Slight increase in small right pleural effusion. Mild adjacent opacity is again noted. No edema. Prominence of the central vasculature. Overlapping cardiac leads. IMPRESSION: New left-sided PICC with tip along the central SVC above the right atrium. Slight increase in tiny right pleural effusion. Electronically Signed   By: Karen Kays M.D.   On: 08/10/2023 16:00   US Abdomen Limited RUQ (LIVER/GB) Result Date: 08/10/2023 CLINICAL DATA:  Hyperbilirubinemia. EXAM: ULTRASOUND ABDOMEN LIMITED RIGHT UPPER QUADRANT COMPARISON:  08/06/2023 FINDINGS: Gallbladder: Dilated gallbladder with some borderline wall thickening of 3 mm. Several internal echoes consistent with sludge. No shadowing stones. Patient is on a ventilator. Common bile duct: Diameter: 6 mm Liver: No focal lesion identified. Within normal limits in parenchymal echogenicity. Portal vein is patent on color Doppler imaging with normal direction of blood flow towards the liver. Other: Trace ascites. IMPRESSION: Distended gallbladder with borderline wall thickening and sludge. No obvious stones or ductal dilatation. Trace ascites. Electronically Signed   By: Karen Kays M.D.   On: 08/10/2023 15:59   VAS [place] LOWER EXTREMITY VENOUS (DVT) Result Date: 08/09/2023  Lower Venous DVT Study Patient Name:  ELLWYN ERGLE  Date of Exam:   08/09/2023 Medical Rec #: [id number]         Accession #:    [id number] Date of Birth: 08-25-1950         Patient Gender: M Patient Age:   24 years Exam Location:  [facility] Procedure:      VAS [place] LOWER EXTREMITY VENOUS (DVT) Referring Phys: Scheryl Marten XU --------------------------------------------------------------------------------  Indications: Embolic stroke, prostate cancer, weakness. Other Indications: Factor V Leiden and prothrombin gene mutation. Comparison Study: Previous study on 5.30.2024. Performing Technologist: Fernande Bras  Examination Guidelines: A complete evaluation includes B-mode imaging, spectral Doppler, color Doppler, and power Doppler as needed of all accessible portions of each vessel. Bilateral testing is considered an integral part of a complete examination. Limited examinations for reoccurring indications may be performed as noted. The reflux portion of the exam is performed with the patient in reverse Trendelenburg.  +---------+---------------+---------+-----------+----------+-------------------+ RIGHT    CompressibilityPhasicitySpontaneityPropertiesThrombus Aging      +---------+---------------+---------+-----------+----------+-------------------+ CFV      Full           Yes      Yes                                      +---------+---------------+---------+-----------+----------+-------------------+  SFJ      Full           Yes      Yes                                      +---------+---------------+---------+-----------+----------+-------------------+ FV Prox  Full                                                             +---------+---------------+---------+-----------+----------+-------------------+ FV Mid   Full                                                             +---------+---------------+---------+-----------+----------+-------------------+ FV DistalFull                                                              +---------+---------------+---------+-----------+----------+-------------------+ PFV      Full                                                             +---------+---------------+---------+-----------+----------+-------------------+ POP      Full           Yes      Yes                                      +---------+---------------+---------+-----------+----------+-------------------+ PTV                                                   Not well                                                                  visualized, patent                                                        by color.           +---------+---------------+---------+-----------+----------+-------------------+ PERO  Not well                                                                  visualized, patent                                                        by color.           +---------+---------------+---------+-----------+----------+-------------------+ Gastroc  Partial        No       No                                       +---------+---------------+---------+-----------+----------+-------------------+   +---------+---------------+---------+-----------+----------+--------------+ LEFT     CompressibilityPhasicitySpontaneityPropertiesThrombus Aging +---------+---------------+---------+-----------+----------+--------------+ CFV      Full           Yes      Yes                                 +---------+---------------+---------+-----------+----------+--------------+ SFJ      Full           Yes      Yes                                 +---------+---------------+---------+-----------+----------+--------------+ FV Prox  Full                                                        +---------+---------------+---------+-----------+----------+--------------+ FV Mid   Full                                                         +---------+---------------+---------+-----------+----------+--------------+ FV DistalFull                                                        +---------+---------------+---------+-----------+----------+--------------+ PFV      Full                                                        +---------+---------------+---------+-----------+----------+--------------+ POP      Full           Yes      Yes                                 +---------+---------------+---------+-----------+----------+--------------+  PTV      Full                                                        +---------+---------------+---------+-----------+----------+--------------+ PERO     Full                                                        +---------+---------------+---------+-----------+----------+--------------+     Summary: RIGHT: 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 - No cystic structure found in the popliteal fossa.  LEFT: - There is no evidence of deep vein thrombosis in the lower extremity.  - No cystic structure found in the popliteal fossa.  *See table(s) above for measurements and observations. Electronically signed by Carolynn Sayers on 08/09/2023 at 1:09:22 PM.    Final    MR BRAIN WO CONTRAST Result Date: 08/08/2023 CLINICAL DATA:  altered mental status, concerned for recurrent strokes; Stroke, follow up. EXAM: MRI HEAD WITHOUT CONTRAST MRA HEAD WITHOUT CONTRAST TECHNIQUE: Multiplanar, multi-echo pulse sequences of the brain and surrounding structures were acquired without intravenous contrast. Angiographic images of the Circle of Willis were acquired using MRA technique without intravenous contrast. COMPARISON:  Head MRI 08/04/2023.  Head and neck CTA 04/06/2023. FINDINGS: MRI HEAD FINDINGS Brain: A 5 mm acute infarct in the left paracentral pons is new from the prior MRI. Numerous  acute to subacute infarcts are again seen involving cortex and white matter in both cerebral hemispheres as well as the dorsal left thalamus and cerebellum. A few of these may be minimally larger or more conspicuous than on the prior MRI, however no sizable new supratentorial infarct is identified. There is up to mild associated cytotoxic edema without mass effect. No intracranial hemorrhage, mass, midline shift, or extra-axial fluid collection is identified. There is mild cerebral atrophy. Vascular: Major intracranial vascular flow voids are preserved. Skull and upper cervical spine: Unremarkable bone marrow signal. Sinuses/Orbits: Unremarkable orbits. No significant inflammatory disease in the paranasal sinuses. Clear mastoid air cells. Other: None. MRA HEAD FINDINGS Anterior circulation: The internal carotid arteries are widely patent from skull base to carotid termini. ACAs and MCAs are patent without evidence of a proximal branch occlusion or significant proximal stenosis. No aneurysm is identified. Posterior circulation: The included portions of the intracranial vertebral arteries are patent to the basilar. Patent PICA and SCA origins are visualized bilaterally. The basilar artery is widely patent. Posterior communicating arteries are diminutive or absent. Both PCAs are patent without evidence of a significant proximal stenosis. No aneurysm is identified. Anatomic variants: Aplastic right A1 segment. IMPRESSION: 1. 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 2. 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 3. Negative head MRA. Electronically Signed   By: Sebastian Ache M.D.   On: 08/08/2023 16:38   MR ANGIO HEAD WO CONTRAST Result Date: 08/08/2023 CLINICAL DATA:  altered mental status, concerned for recurrent strokes; Stroke, follow up. EXAM: MRI HEAD WITHOUT CONTRAST MRA HEAD WITHOUT CONTRAST TECHNIQUE: Multiplanar, multi-echo pulse sequences of the  brain and surrounding structures were acquired without intravenous contrast. Angiographic images of the [place] were acquired using MRA technique without intravenous contrast. COMPARISON:  Head MRI 08/04/2023.  Head and neck CTA 04/06/2023. FINDINGS: MRI HEAD FINDINGS Brain: A 5 mm acute infarct in the left paracentral pons is new from the prior MRI. Numerous acute to subacute infarcts are again seen involving cortex and white matter in both cerebral hemispheres as well as the dorsal left thalamus and cerebellum. A few of these may be minimally larger or more conspicuous than on the prior MRI, however no sizable new supratentorial infarct is identified. There is up to mild associated cytotoxic edema without mass effect. No intracranial hemorrhage, mass, midline shift, or extra-axial fluid collection is identified. There is mild cerebral atrophy. Vascular: Major intracranial vascular flow voids are preserved. Skull and upper cervical spine: Unremarkable bone marrow signal. Sinuses/Orbits: Unremarkable orbits. No significant inflammatory disease in the paranasal sinuses. Clear mastoid air cells. Other: None. MRA HEAD FINDINGS Anterior circulation: The internal carotid arteries are widely patent from skull base to carotid termini. ACAs and MCAs are patent without evidence of a proximal branch occlusion or significant proximal stenosis. No aneurysm is identified. Posterior circulation: The included portions of the intracranial vertebral arteries are patent to the basilar. Patent PICA and SCA origins are visualized bilaterally. The basilar artery is widely patent. Posterior communicating arteries are diminutive or absent. Both PCAs are patent without evidence of a significant proximal stenosis. No aneurysm is identified. Anatomic variants: Aplastic right A1 segment. IMPRESSION: 1. 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 2. Numerous acute to subacute infarcts elsewhere in the brain without other significant  interval progression. 3. Negative head MRA. Electronically Signed   By: Sebastian Ache M.D.   On: 08/08/2023 16:38   US LIVER DOPPLER Result Date: 08/08/2023 CLINICAL DATA:  Elevated LFTs. EXAM: DUPLEX ULTRASOUND OF LIVER TECHNIQUE: Color and duplex Doppler ultrasound was performed to evaluate the hepatic in-flow and out-flow vessels. COMPARISON:  CT a abdomen and pelvis 08/07/2023 FINDINGS: Liver: Liver echogenicity is within normal limits. Trace perihepatic ascites. Liver contour is within normal limits. No discrete liver lesion. Main Portal Vein size: 1.1 cm Portal Vein Velocities Main Prox:  42 cm/sec Main Mid: 65 cm/sec Main Dist:  55 cm/sec Right: 23 cm/sec Left: 14 cm/sec Hepatic Vein Velocities Right:  39 cm/sec Middle:  38 cm/sec Left:  43 cm/sec IVC: Patent. Hepatic Artery Velocity:  228 cm/sec Splenic Vein Velocity:  52 cm/sec Spleen: 17.0 cm x 5.2 cm x 5.8 cm with a total volume of 267 cm^3 (411 cm^3 is upper limit normal) Portal Vein Occlusion/Thrombus: No Splenic Vein Occlusion/Thrombus: No Ascites: Present.  Trace perihepatic ascites. Varices: None Normal hepatopetal flow in the portal veins. Normal hepatofugal flow in the hepatic veins. IMPRESSION: 1. Portal venous system is patent with normal direction of flow. 2. Perihepatic ascites. Electronically Signed   By: Richarda Overlie M.D.   On: 08/08/2023 14:56   DG Abd 1 View Result Date: 08/08/2023 CLINICAL DATA:  Orogastric tube placement. EXAM: ABDOMEN - 1 VIEW COMPARISON:  None Available. FINDINGS: The bowel gas pattern is normal. Distal tip of nasogastric tube is seen in expected position of proximal stomach. No radio-opaque calculi or other significant radiographic abnormality are seen. IMPRESSION: Distal tip of nasogastric tube is seen in expected position of proximal stomach. Electronically Signed   By: Lupita Raider M.D.   On: 08/08/2023 11:51   DG Chest Port 1 View Result Date: 08/08/2023 CLINICAL DATA:  Intubation. EXAM: PORTABLE CHEST 1  VIEW COMPARISON:  Same day. FINDINGS: Endotracheal and nasogastric tubes appear to be in grossly good position. Stable right-sided pulmonary findings compared to prior exam. IMPRESSION: Endotracheal and nasogastric tubes appear to be in grossly good position. Electronically Signed   By: Lupita Raider M.D.   On: 08/08/2023 11:50   [place] EKG SITE RITE Result Date: 08/08/2023 If Site Rite image not attached, placement could not be confirmed due to current cardiac rhythm.  DG Chest Port 1 View Result Date: 08/08/2023 CLINICAL DATA:  Shortness of breath. EXAM: PORTABLE CHEST 1 VIEW COMPARISON:  08/07/2023 FINDINGS: Stable cardiomediastinal contours. Loop recorder, unchanged. Persistent right pleural effusion with overlying pleuroparenchymal scarring. Left lung appears clear. No new findings. The visualized osseous structures appear intact. Postoperative changes noted in the cervical spine. IMPRESSION: Persistent right pleural effusion with overlying pleuroparenchymal scarring. Electronically Signed   By: Signa Kell M.D.   On: 08/08/2023 07:56   CT Angio Abd/Pel w/ and/or w/o Result Date: 08/07/2023 CLINICAL DATA:  Hepatic insufficiency EXAM: CT ANGIOGRAPHY ABDOMEN AND PELVIS WITH CONTRAST AND WITHOUT CONTRAST TECHNIQUE: Multidetector CT imaging of the abdomen and pelvis was performed using the liver protocol during bolus administration of intravenous contrast. Because of an IV malfunction, a repeat bolus was required to obtain arterial phase images. Multiplanar reconstructed images and MIPs were obtained and reviewed to evaluate the vascular anatomy. RADIATION DOSE REDUCTION: This exam was performed according to the departmental dose-optimization program which includes automated exposure control, adjustment of the mA and/or kV according to patient size and/or use of iterative reconstruction technique. CONTRAST:  OMNIPAQUE IOHEXOL 350 MG/ML SOLN COMPARISON:  None Available. FINDINGS: VASCULAR Aorta:  Normal caliber aorta without aneurysm, dissection, vasculitis or significant stenosis. Moderate atherosclerotic calcification Celiac: Common origin of the celiac axis and superior mesenteric artery. Wide patency. No aneurysm or dissection. SMA: Common origin of the superior mesenteric artery and celiac axis. Wide patency. No aneurysm or dissection. 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 Right renal artery is widely patent. Normal vascular morphology. No aneurysm or dissection. Accessory lower pole renal arteries noted bilaterally. IMA: Patent without evidence of aneurysm, dissection, vasculitis or significant stenosis. Inflow: Patent without evidence of aneurysm, dissection, vasculitis or significant stenosis. Proximal Outflow: Bilateral common femoral and visualized portions of the superficial and profunda femoral arteries are patent without evidence of aneurysm, dissection, vasculitis or significant stenosis. Veins: Unremarkable.  Portal and hepatic veins are patent. Review of the MIP images confirms the above findings. 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 Mild cardiomegaly. Hepatobiliary: Cholelithiasis without superimposed pericholecystic inflammatory change. Liver unremarkable; no enhancing intrahepatic mass identified. No intra or extrahepatic biliary ductal dilation. Pancreas: Unremarkable Spleen: The previously noted hypodense lesion within the upper pole of the spleen demonstrates progressive enhancement on delayed phase images but remains hypoenhancing in relation to adjacent splenic parenchyma and differential considerations include low-grade lymphoma or atypical hamartoma. Adrenals/Urinary Tract: Bilateral adrenal nodules are again identified and are indeterminate due to technical artifact related repeat bolus imaging. These are better assessed on prior examination 08/06/2023. Mild  left hydronephrosis again identified in keeping with a mild left UPJ obstruction. The kidneys are otherwise unremarkable save for a simple cortical cyst within the upper pole the right kidney for which no follow-up imaging is recommended. The bladder is decompressed. Stomach/Bowel: Stomach is within normal limits. Appendix appears normal. No evidence of bowel wall thickening, distention, or inflammatory changes. Trace ascites no  free intraperitoneal gas. Lymphatic: No pathologic adenopathy within the abdomen and pelvis. Mild retroperitoneal edema again noted. Reproductive: Mild prostatic hypertrophy. Other: Mild diffuse subcutaneous body wall edema. Small bilateral fat containing inguinal hernias. Musculoskeletal: No acute bone abnormality. No lytic or blastic lesion. IMPRESSION: 1. No acute intra-abdominal pathology identified. No definite radiographic explanation for the patient's reported symptoms. 2. 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 3. Patency of the hepatic arterial and venous structures. 4. Cholelithiasis. 5. Stable mild left UPJ obstruction. 6. Bilateral adrenal nodules are again identified and are indeterminate due to technical artifact related repeat bolus imaging. These are better assessed on prior examination 08/06/2023. Dedicated contrast enhanced MRI examination is recommended. 7. 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 8. Mild cardiomegaly. 9. The previously noted hypodense lesion within the upper pole of the spleen demonstrates progressive enhancement on delayed phase images but remains hypoenhancing in relation to adjacent splenic parenchyma and differential considerations include low-grade lymphoma or atypical hamartoma. This could be simultaneously assessed with contrast enhanced MRI examination. Aortic Atherosclerosis (ICD10-I70.0). Electronically Signed   By: Helyn Numbers M.D.   On: 08/07/2023 22:01    US THYROID Result Date: 08/07/2023 CLINICAL DATA:  Nodule posterior right on recent CT chest EXAM: THYROID ULTRASOUND TECHNIQUE: Ultrasound examination of the thyroid gland and adjacent soft tissues was performed. COMPARISON:  CT 08/06/2023 FINDINGS: Parenchymal Echotexture: Mildly heterogenous Isthmus: 0.6 cm thickness Right lobe: 3.6 x 1.2 x 1.7 cm Left lobe: 3.5 x 1.7 x 1.7 cm _________________________________________________________ Estimated total number of nodules >/= 1 cm: 0 Number of spongiform nodules >/=  2 cm not described below (TR1): 0 Number of mixed cystic and solid nodules >/= 1.5 cm not described below (TR2): 0 _________________________________________________________ Technologist describes technically difficult study, limited secondary to patient inability to extend neck. No discrete nodules are seen within the thyroid gland. No regional cervical adenopathy demonstrated. IMPRESSION: Negative limited study. The posterior right thyroid nodule described on CT was not identified. Electronically Signed   By: Corlis Leak M.D.   On: 08/07/2023 14:04   DG CHEST PORT 1 VIEW Result Date: 08/07/2023 CLINICAL DATA:  Follow-up right basilar opacity EXAM: PORTABLE CHEST 1 VIEW COMPARISON:  08/05/2023 FINDINGS: Cardiac shadow is within normal limits. Persistent small right effusion and basilar atelectasis is noted. Left lung remains clear. No bony abnormality is seen. Postsurgical changes in the cervical spine are noted. IMPRESSION: Stable right basilar opacity and effusion. Electronically Signed   By: Alcide Clever M.D.   On: 08/07/2023 02:25   US Abdomen Limited RUQ (LIVER/GB) Result Date: 08/07/2023 CLINICAL DATA:  Elevated LFTs EXAM: ULTRASOUND ABDOMEN LIMITED RIGHT UPPER QUADRANT COMPARISON:  CT from earlier in the same day. FINDINGS: Gallbladder: No gallstones or wall thickening visualized. No sonographic Murphy sign noted by sonographer. Common bile duct: Diameter: 4.7 mm. Liver: No focal lesion  identified. Within normal limits in parenchymal echogenicity. Portal vein is patent on color Doppler imaging with normal direction of blood flow towards the liver. Other: None. IMPRESSION: No acute abnormality noted. Electronically Signed   By: Alcide Clever M.D.   On: 08/07/2023 00:44   CT CHEST ABDOMEN PELVIS W CONTRAST Result Date: 08/06/2023 CLINICAL DATA:  Abdomen or its really busy EXAM: CT CHEST, ABDOMEN, AND PELVIS WITH CONTRAST TECHNIQUE: Multidetector CT imaging of the chest, abdomen and pelvis was performed following the standard protocol during bolus administration of intravenous contrast. RADIATION DOSE REDUCTION: This exam was  performed according to the departmental dose-optimization program which includes automated exposure control, adjustment of the mA and/or kV according to patient size and/or use of iterative reconstruction technique. CONTRAST:  75mL OMNIPAQUE IOHEXOL 350 MG/ML SOLN COMPARISON:  CT abdomen pelvis 04/20/2020 FINDINGS: CT CHEST FINDINGS Cardiovascular: Moderate coronary artery calcification. Global cardiac size within limits. Trace pericardial effusion. Central pulmonary arteries are of caliber. Mild atherosclerotic calcification within thoracic aorta. No aortic aneurysm. Mediastinum/Nodes: No pathologic thoracic adenopathy. Stable 19 mm nodule within the posterior right thyroid lobe, not well characterized on this examination. Esophagus unremarkable. 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 No superimposed focal pulmonary infiltrate. No pneumothorax. Central airways are widely patent. Musculoskeletal: No chest wall mass or suspicious bone lesions identified. Probable bone island within the T9 vertebral body. CT ABDOMEN PELVIS FINDINGS Hepatobiliary: No focal liver abnormality is seen. No gallstones, gallbladder wall  thickening, or biliary dilatation. Pancreas: Unremarkable Spleen: Mild splenomegaly with the spleen measuring 13.5 cm in greatest dimension. Enlarging 3.4 cm complex hypoechoic lesion within the upper pole the spleen demonstrating heterogeneous attenuation with a probable solid component superiorly. Differential considerations include a hypoenhancing hamartoma, low-grade lymphoma, or potentially inflammatory pseudotumor, however, this is not well characterized on this examination. Adrenals/Urinary Tract: Bilateral adrenal nodules have developed since prior examination and are indeterminate, measuring 40-55 Hounsfield units in density. These measure up to 2.5 cm in diameter on left. The kidneys are normal in size and position. 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 Simple cortical cyst noted within the upper pole the right kidney for which no follow-up imaging is recommended. The kidneys are otherwise unremarkable. The bladder is partially decompressed and is unremarkable. Stomach/Bowel: Mild perihepatic ascites. The stomach, small, and large bowel are unremarkable. Appendix normal. No free intraperitoneal gas. Vascular/Lymphatic: Aortic atherosclerosis. No enlarged abdominal or pelvic lymph nodes. Reproductive: Mild prostatic hypertrophy. Other: Mild subcutaneous body wall edema. Mild retroperitoneal edema. Small bilateral fat containing inguinal hernias. Musculoskeletal: No acute or significant osseous findings. IMPRESSION: 1. No acute intrathoracic or intra-abdominal pathology identified. 2. Moderate coronary artery calcification. 3. 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 4. Enlarging 3.4 cm complex hypoechoic lesion within the upper pole the spleen  demonstrating heterogeneous attenuation with a probable solid component superiorly. Differential considerations include a hypoenhancing hamartoma, low-grade lymphoma, or potentially inflammatory pseudotumor, however, this is not well characterized on this examination. This could be simultaneously assessed with dedicated MRI examination of the adrenal glands. 5. Bilateral adrenal nodules have developed since prior examination and are indeterminate, measuring 40-55 Hounsfield units in density. These measure up to 2.5 cm in diameter on left. Dedicated contrast enhanced MRI examination is recommended for further characterization. 6. Mild splenomegaly. 7. Mild anasarca with trace ascites and body wall retroperitoneal edema. 8. Stable 19 mm nodule within the posterior right thyroid lobe, not well characterized on this examination. Recommend thyroid US (ref: J Am Coll Radiol. 2015 Feb;12(2): 143-50). Aortic Atherosclerosis (ICD10-I70.0). Electronically Signed   By: Helyn Numbers M.D.   On: 08/06/2023 15:14   ECHOCARDIOGRAM COMPLETE Result Date: 08/06/2023    ECHOCARDIOGRAM REPORT   Patient Name:   ORLANDO DEVEREUX Date of Exam: 08/06/2023 Medical Rec #:  [id number]  Height:       71.0 in Accession #:    [id number]       Weight:       191.0 lb Date of Birth:  06/10/1950        BSA:          2.068 m Patient Age:    72 years         BP:           110/87 mmHg Patient Gender: M                HR:           72 bpm. Exam Location:  Inpatient Procedure: 2D Echo, Cardiac Doppler, Color Doppler and Intracardiac            Opacification Agent (Both Spectral and Color Flow Doppler were            utilized during procedure). Indications:    Stroke  History:        Patient has prior history of Echocardiogram examinations, most                 recent 04/07/2023. CHF, Stroke, Signs/Symptoms:Syncope; Risk                 Factors:Dyslipidemia, Diabetes, Hypertension and Sleep Apnea.  Sonographer:    Sheralyn Boatman RDCS Referring Phys:  [id number] DENISE A WOLFE  Sonographer Comments: Technically difficult study due to poor echo windows, Technically challenging study due to limited acoustic windows, suboptimal parasternal window, suboptimal apical window and suboptimal subcostal window. Image acquisition challenging due to respiratory motion. Suboptimal windows despite attempts to turn patient. IMPRESSIONS  1. Left ventricular ejection fraction, by estimation, is 60 to 65%. The left ventricle has normal function. The left ventricle has no regional wall motion abnormalities. Left ventricular diastolic parameters are consistent with Grade I diastolic dysfunction (impaired relaxation).  2. Right ventricular systolic function is normal. The right ventricular size is normal. Tricuspid regurgitation signal is inadequate for assessing PA pressure.  3. The mitral valve was not well visualized. No evidence of mitral valve regurgitation. No evidence of mitral stenosis.  4. The aortic valve was not well visualized. There is mild calcification of the aortic valve. Aortic valve regurgitation is not visualized. Aortic valve sclerosis is present, with no evidence of aortic valve stenosis. Aortic valve Vmax measures 2.19 m/s.  5. The inferior vena cava is normal in size with greater than 50% respiratory variability, suggesting right atrial pressure of 3 mmHg. FINDINGS  Left Ventricle: Left ventricular ejection fraction, by estimation, is 60 to 65%. The left ventricle has normal function. The left ventricle has no regional wall motion abnormalities. Definity contrast agent was given IV to delineate the left ventricular  endocardial borders. The left ventricular internal cavity size was normal in size. There is no left ventricular hypertrophy. Left ventricular diastolic parameters are consistent with Grade I diastolic dysfunction (impaired relaxation). Right Ventricle: The right ventricular size is normal. No increase in right ventricular wall thickness. Right  ventricular systolic function is normal. Tricuspid regurgitation signal is inadequate for assessing PA pressure. Left Atrium: Left atrial size was normal in size. Right Atrium: Right atrial size was normal in size. Pericardium: There is no evidence of pericardial effusion. Mitral Valve: The mitral valve was not well visualized. No evidence of mitral valve regurgitation. No evidence of mitral valve stenosis. Tricuspid Valve: The tricuspid valve is not well visualized. Tricuspid valve regurgitation is not demonstrated. No evidence of  tricuspid stenosis. Aortic Valve: The aortic valve was not well visualized. There is mild calcification of the aortic valve. Aortic valve regurgitation is not visualized. Aortic valve sclerosis is present, with no evidence of aortic valve stenosis. Aortic valve mean gradient measures 9.0 mmHg. Aortic valve peak gradient measures 19.2 mmHg. Aortic valve area, by VTI measures 2.56 cm. Pulmonic Valve: The pulmonic valve was normal in structure. Pulmonic valve regurgitation is not visualized. No evidence of pulmonic stenosis. Aorta: The aortic root is normal in size and structure. Venous: The inferior vena cava is normal in size with greater than 50% respiratory variability, suggesting right atrial pressure of 3 mmHg. IAS/Shunts: No atrial level shunt detected by color flow Doppler.  LEFT VENTRICLE PLAX 2D LVIDd:         4.90 cm LVIDs:         3.50 cm LV PW:         1.00 cm LV IVS:        0.90 cm LVOT diam:     2.30 cm LV SV:         91 LV SV Index:   44 LVOT Area:     4.15 cm  LV Volumes (MOD) LV vol d, MOD A2C: 95.7 ml LV vol d, MOD A4C: 108.0 ml LV vol s, MOD A2C: 46.4 ml LV vol s, MOD A4C: 38.6 ml LV SV MOD A2C:     49.3 ml LV SV MOD A4C:     108.0 ml LV SV MOD BP:      67.9 ml RIGHT VENTRICLE             IVC RV S prime:     14.00 cm/s  IVC diam: 1.90 cm TAPSE (M-mode): 3.2 cm LEFT ATRIUM             Index        RIGHT ATRIUM           Index LA diam:        3.50 cm 1.69 cm/m   RA Area:      10.90 cm LA Vol (A2C):   41.3 ml 19.97 ml/m  RA Volume:   19.40 ml  9.38 ml/m LA Vol (A4C):   62.6 ml 30.28 ml/m LA Biplane Vol: 51.3 ml 24.81 ml/m  AORTIC VALVE AV Area (Vmax):    2.16 cm AV Area (Vmean):   2.48 cm AV Area (VTI):     2.56 cm AV Vmax:           219.00 cm/s AV Vmean:          139.000 cm/s AV VTI:            0.355 m AV Peak Grad:      19.2 mmHg AV Mean Grad:      9.0 mmHg LVOT Vmax:         114.00 cm/s LVOT Vmean:        83.100 cm/s LVOT VTI:          0.219 m LVOT/AV VTI ratio: 0.62  AORTA Ao Root diam: 3.10 cm MITRAL VALVE MV Area (PHT): 3.08 cm    SHUNTS MV Decel Time: 246 msec    Systemic VTI:  0.22 m MV E velocity: 82.70 cm/s  Systemic Diam: 2.30 cm MV A velocity: 90.30 cm/s MV E/A ratio:  0.92 Donato Schultz MD Electronically signed by Donato Schultz MD Signature Date/Time: 08/06/2023/1:02:26 PM    Final    DG Chest Port 1 View Result  Date: 08/05/2023 CLINICAL DATA:  Shortness of breath. EXAM: PORTABLE CHEST 1 VIEW COMPARISON:  August 04, 2023. FINDINGS: Stable cardiomediastinal silhouette. Left lung is clear. Stable right basilar opacities are noted concerning for subsegmental atelectasis or possibly pneumonia. Bony thorax is unremarkable. IMPRESSION: Stable right basilar opacities as noted above. Electronically Signed   By: Lupita Raider M.D.   On: 08/05/2023 11:23   MR CERVICAL SPINE W WO CONTRAST Result Date: 08/04/2023 CLINICAL DATA:  hyperreflexia and worsening neurological status in setting of prior C-spine surgery EXAM: MRI CERVICAL SPINE WITHOUT AND WITH CONTRAST TECHNIQUE: Multiplanar and multiecho pulse sequences of the cervical spine, to include the craniocervical junction and cervicothoracic junction, were obtained without and with intravenous contrast. CONTRAST:  8.28mL GADAVIST GADOBUTROL 1 MMOL/ML IV SOLN COMPARISON:  None Available. FINDINGS: Moderately motion limited study. Alignment: No substantial sagittal subluxation. Vertebrae: Motion limited evaluation. Edema  involving the left T1 inferior articular facet. C5-C7 ACDF. Cord: Cord T2/hyperintensity from C2 to C3-C4. Posterior Fossa, vertebral arteries, paraspinal tissues: Visualized vertebral artery flow voids are maintained. No significant paraspinal edema. Disc levels: C2-C3: No significant disc protrusion, foraminal stenosis, or canal stenosis. C3-C4: Posterior disc osteophyte complex with ligamentum flavum thickening and bilateral facet and uncovertebral hypertrophy. Resulting severe canal and left foraminal stenosis. Moderate right foraminal stenosis. C4-C5: Small posterior disc osteophyte complex and left greater than right facet and uncovertebral hypertrophy. Mild left foraminal stenosis. Patent canal. C5-C6: ACDF.  Patent canal and foramina. C6-C7: ACDF.  Patent canal and foramina. C7-T1: Small posterior disc osteophyte complex and bilateral facet and uncovertebral hypertrophy. Patent canal and foramina. IMPRESSION: 1. 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 Cord T2 hyperintensity superior to this level, suspicious for edema and compressive myelopathy. Severe left and moderate right foraminal stenosis at this level. 2. Edema involving the left T1 inferior articular facet, which is nonspecific but most likely related to stress. A CT could better assess bone detail and exclude fracture if clinically warranted. Electronically Signed   By: Feliberto Harts M.D.   On: 08/04/2023 20:53   MR THORACIC SPINE WO CONTRAST Result Date: 08/04/2023 CLINICAL DATA:  Mid-back pain, neuro deficit EXAM: MRI THORACIC SPINE WITHOUT CONTRAST TECHNIQUE: Multiplanar, multisequence MR imaging of the thoracic spine was performed. No intravenous contrast was administered. COMPARISON:  CT of the chest February 27, 2017. FINDINGS: Alignment:  No substantial sagittal subluxation. Vertebrae: Sclerotic lesion at T9 was present in 2018 and therefore likely is benign. No suspicious bone lesions. No bone marrow edema to suggest acute fracture. Vertebral  body heights are maintained. Cord:  Normal cord signal. Paraspinal and other soft tissues: Motion limited evaluation without definite abnormality. Disc levels: No significant canal or foraminal stenosis. IMPRESSION: No evidence of acute abnormality or significant stenosis. Electronically Signed   By: Feliberto Harts M.D.   On: 08/04/2023 19:54   MR BRAIN WO CONTRAST Result Date: 08/04/2023 CLINICAL DATA:  Stroke, follow up EXAM: MRI HEAD WITHOUT CONTRAST TECHNIQUE: Multiplanar, multiecho pulse sequences of the brain and surrounding structures were obtained without intravenous contrast. COMPARISON:  CT head earlier today. 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 Associated Dema without substantial mass effect. No midline shift. No evidence of acute hemorrhage, mass lesion, or hydrocephalus. Vascular: Major arterial flow voids are maintained at the skull base. Skull and upper cervical spine: Normal marrow signal. Sinuses/Orbits: Negative. 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 Given involvement of multiple vascular territories, consider an embolic etiology. Electronically Signed   By: Feliberto Harts M.D.   On: 08/04/2023 19:25   DG Chest Port 1 View Result Date: 08/04/2023 CLINICAL DATA:  [id number] SOB (shortness of breath) [id number] EXAM: PORTABLE CHEST 1 VIEW COMPARISON:  08/01/2023 FINDINGS: Left chest wall loop recorder. Stable heart size. Low lung volumes. Chronic pleural thickening at the right costophrenic angle. Increased streaky bibasilar opacities. No definite pleural effusion. No pneumothorax. IMPRESSION: Low lung volumes with increased streaky bibasilar opacities, which may represent atelectasis versus pneumonia. Electronically Signed   By: Duanne Guess D.O.   On: 08/04/2023 12:00   EEG adult Result  Date: 08/04/2023 Charlsie Quest, MD     08/04/2023 12:10 PM Patient Name: SRIRAM FEBLES MRN: [id number] Epilepsy Attending: Charlsie Quest Referring Physician/Provider: Leroy Sea, MD Date: 08/04/2023 Duration: 25.23 mins Patient history: 73 year old male with altered mental status.  EEG to alert for seizure. Level of alertness: Awake AEDs during EEG study: None Technical aspects: This EEG study was done with scalp electrodes positioned according to the 10-20 International system of electrode placement. Electrical activity was reviewed with band pass filter of 1-70Hz , sensitivity of 7 uV/mm, display speed of 85mm/sec with a 60Hz  notched filter applied as appropriate. EEG data were recorded continuously and digitally stored.  Video monitoring was available and reviewed as appropriate. Description: The posterior dominant rhythm consists of 7 Hz activity of moderate voltage (25-35 uV) seen predominantly in posterior head regions, symmetric and reactive to eye opening and eye closing. EEG showed continuous generalized 6 to 7 Hz theta slowing. Hyperventilation and photic stimulation were not performed.   ABNORMALITY - Continuous slow, generalized IMPRESSION: This study is suggestive of mild diffuse encephalopathy. No seizures or epileptiform discharges were seen throughout the recording. Priyanka Annabelle Harman   CT HEAD WO CONTRAST ( ) Result Date: 08/04/2023 CLINICAL DATA:  Provided history: Memory loss. EXAM: CT HEAD WITHOUT CONTRAST TECHNIQUE: Contiguous axial images were obtained from the base of the skull through the vertex without intravenous contrast. RADIATION DOSE REDUCTION: This exam was performed according to the departmental dose-optimization program which includes automated exposure control, adjustment of the mA and/or kV according to patient size and/or use of iterative reconstruction technique. COMPARISON:  Non-contrast head CT and CT angiogram head 04/06/2023. Brain MRI 04/06/2023. FINDINGS:  Brain: Moderate generalized cerebral atrophy. 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 Small chronic cortical/subcortical infarct within the mid right frontal lobe, new from the prior head CT of 04/06/2023. There is no acute intracranial hemorrhage. No acute demarcated cortical infarct. No extra-axial fluid collection. No evidence of an intracranial mass. No midline shift. Vascular: No hyperdense vessel. Atherosclerotic calcifications. Skull: No calvarial fracture or aggressive osseous lesion. Sinuses/Orbits: No mass or acute finding within the imaged orbits. Minimal mucosal thickening within the bilateral maxillary and right sphenoid sinuses. Mild mucosal thickening within the bilateral ethmoid sinuses. Minimal mucosal thickening within the left frontal sinus. IMPRESSION: 1. No evidence of an acute intracranial abnormality. 2. 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 3. 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 4. Moderate generalized cerebral atrophy. 5. Paranasal sinus disease as described. Electronically Signed   By: Ronaldo Miyamoto  Renette Butters D.O.   On: 08/04/2023 09:40   DG Chest Port 1 View Result Date: 08/01/2023 CLINICAL DATA:  Generalized weakness, onset 8 p.m. last night. EXAM: PORTABLE CHEST 1 VIEW COMPARISON:  AP Lat chest 04/11/2023 FINDINGS: There is chronic thickening of lower lateral right pleura, associated chronic pleuroparenchymal disease in the lateral right base. The lungs are otherwise clear. A loop recorder device is again noted on the left. The cardiac size is normal. There is mild aortic tortuosity and atherosclerosis with a stable mediastinum and no vascular congestion. No new osseous findings. Dorsal  and ventral cervical fusion hardware is partially visible. Multilevel thoracic spine bridging enthesopathy. IMPRESSION: 1. No evidence of acute chest disease. 2. 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 3. Aortic atherosclerosis. Electronically Signed   By: Almira Bar M.D.   On: 08/01/2023 07:44    Microbiology Recent Results (from the past 240 hours)  Respiratory (~20 pathogens) panel by PCR     Status: None   Collection Time: 08/07/23  1:25 AM   Specimen: Nasopharyngeal Swab; Respiratory  Result Value Ref Range Status   Adenovirus NOT DETECTED NOT DETECTED Final   Coronavirus 229E NOT DETECTED NOT DETECTED Final    Comment: (NOTE) The Coronavirus on the Respiratory Panel, DOES NOT test for the novel  Coronavirus (2019 nCoV)    Coronavirus HKU1 NOT DETECTED NOT DETECTED Final   Coronavirus NL63 NOT DETECTED NOT DETECTED Final   Coronavirus OC43 NOT DETECTED NOT DETECTED Final   Metapneumovirus NOT DETECTED NOT DETECTED Final   Rhinovirus / Enterovirus NOT DETECTED NOT DETECTED Final   Influenza A NOT DETECTED NOT DETECTED Final   Influenza B NOT DETECTED NOT DETECTED Final   Parainfluenza Virus 1 NOT DETECTED NOT DETECTED Final   Parainfluenza Virus 2 NOT DETECTED NOT DETECTED Final   Parainfluenza Virus 3 NOT DETECTED NOT DETECTED Final   Parainfluenza Virus 4 NOT DETECTED NOT DETECTED Final   Respiratory Syncytial Virus NOT DETECTED NOT DETECTED Final   Bordetella pertussis NOT DETECTED NOT DETECTED Final   Bordetella Parapertussis NOT DETECTED NOT DETECTED Final   Chlamydophila pneumoniae NOT DETECTED NOT DETECTED Final   Mycoplasma pneumoniae NOT DETECTED NOT DETECTED Final    Comment: Performed at [facility], [address], [place], [place] [address]  Culture, blood (Routine X 2) w Reflex to ID Panel     Status: Abnormal   Collection Time: 08/07/23  2:43 AM   Specimen: BLOOD LEFT ARM  Result Value Ref Range Status   Specimen Description BLOOD  LEFT ARM  Final   Special Requests   Final    BOTTLES DRAWN AEROBIC AND ANAEROBIC Blood Culture adequate volume   Culture  Setup Time   Final    GRAM POSITIVE COCCI IN BOTH AEROBIC AND ANAEROBIC BOTTLES CRITICAL RESULT CALLED TO, READ BACK BY AND VERIFIED WITH: PHARMD VAnnice Needy [id number] @2306  FH    Culture (A)  Final    STAPHYLOCOCCUS EPIDERMIDIS STAPHYLOCOCCUS CAPITIS THE SIGNIFICANCE OF ISOLATING THIS ORGANISM FROM A SINGLE SET OF BLOOD CULTURES WHEN MULTIPLE SETS ARE DRAWN IS UNCERTAIN. PLEASE NOTIFY THE MICROBIOLOGY DEPARTMENT WITHIN ONE WEEK IF SPECIATION AND SENSITIVITIES ARE REQUIRED. Performed at [facility], [address] [place], [place] [address]    Report Status 08/11/2023 FINAL  Final  Blood Culture ID Panel (Reflexed)     Status: Abnormal   Collection Time: 08/07/23  2:43 AM  Result Value Ref Range Status   Enterococcus faecalis NOT DETECTED NOT  DETECTED Final   Enterococcus Faecium NOT DETECTED NOT DETECTED Final   Listeria monocytogenes NOT DETECTED NOT DETECTED Final   Staphylococcus species DETECTED (A) NOT DETECTED Final    Comment: CRITICAL RESULT CALLED TO, READ BACK BY AND VERIFIED WITH: PHARMD VAnnice Needy [id number] @2306  FH    Staphylococcus aureus (BCID) NOT DETECTED NOT DETECTED Final   Staphylococcus epidermidis DETECTED (A) NOT DETECTED Final    Comment: Methicillin (oxacillin) resistant coagulase negative staphylococcus. Possible blood culture contaminant (unless isolated from more than one blood culture draw or clinical case suggests pathogenicity). No antibiotic treatment is indicated for blood  culture contaminants. CRITICAL RESULT CALLED TO, READ BACK BY AND VERIFIED WITH: PHARMD V. Annice Needy [id number] @2306  FH    Staphylococcus lugdunensis NOT DETECTED NOT DETECTED Final   Streptococcus species NOT DETECTED NOT DETECTED Final   Streptococcus agalactiae NOT DETECTED NOT DETECTED Final   Streptococcus pneumoniae NOT DETECTED NOT DETECTED Final   Streptococcus  pyogenes NOT DETECTED NOT DETECTED Final   A.calcoaceticus-baumannii NOT DETECTED NOT DETECTED Final   Bacteroides fragilis NOT DETECTED NOT DETECTED Final   Enterobacterales NOT DETECTED NOT DETECTED Final   Enterobacter cloacae complex NOT DETECTED NOT DETECTED Final   Escherichia coli NOT DETECTED NOT DETECTED Final   Klebsiella aerogenes NOT DETECTED NOT DETECTED Final   Klebsiella oxytoca NOT DETECTED NOT DETECTED Final   Klebsiella pneumoniae NOT DETECTED NOT DETECTED Final   Proteus species NOT DETECTED NOT DETECTED Final   Salmonella species NOT DETECTED NOT DETECTED Final   Serratia marcescens NOT DETECTED NOT DETECTED Final   Haemophilus influenzae NOT DETECTED NOT DETECTED Final   Neisseria meningitidis NOT DETECTED NOT DETECTED Final   Pseudomonas aeruginosa NOT DETECTED NOT DETECTED Final   Stenotrophomonas maltophilia NOT DETECTED NOT DETECTED Final   Candida albicans NOT DETECTED NOT DETECTED Final   Candida auris NOT DETECTED NOT DETECTED Final   Candida glabrata NOT DETECTED NOT DETECTED Final   Candida krusei NOT DETECTED NOT DETECTED Final   Candida parapsilosis NOT DETECTED NOT DETECTED Final   Candida tropicalis NOT DETECTED NOT DETECTED Final   Cryptococcus neoformans/gattii NOT DETECTED NOT DETECTED Final   Methicillin resistance mecA/C DETECTED (A) NOT DETECTED Final    Comment: CRITICAL RESULT CALLED TO, READ BACK BY AND VERIFIED WITH: Mercer Pod [id number] @2306  FH Performed at [facility], [address] [place], [place] [address]   Culture, blood (Routine X 2) w Reflex to ID Panel     Status: None   Collection Time: 08/07/23  2:45 AM   Specimen: BLOOD RIGHT HAND  Result Value Ref Range Status   Specimen Description BLOOD RIGHT HAND  Final   Special Requests   Final    BOTTLES DRAWN AEROBIC AND ANAEROBIC Blood Culture adequate volume   Culture   Final    NO GROWTH 5 DAYS Performed at [facility], [address], [place], [place]  [address]    Report Status 08/12/2023 FINAL  Final  SARS Coronavirus 2 by RT PCR (hospital order, performed in [facility] hospital lab) *cepheid single result test* Anterior Nasal Swab     Status: None   Collection Time: 08/07/23  7:38 AM   Specimen: Anterior Nasal Swab  Result Value Ref Range Status   SARS Coronavirus 2 by RT PCR NEGATIVE NEGATIVE Final    Comment: Performed at [facility], [address], [place], [place] [address]  MRSA Next Gen by PCR, Nasal     Status:  None   Collection Time: 08/07/23  7:38 AM   Specimen: Nasal Mucosa; Nasal Swab  Result Value Ref Range Status   MRSA by PCR Next Gen NOT DETECTED NOT DETECTED Final    Comment: (NOTE) The GeneXpert MRSA Assay (FDA approved for NASAL specimens only), is one component of a comprehensive MRSA colonization surveillance program. It is not intended to diagnose MRSA infection nor to guide or monitor treatment for MRSA infections. Test performance is not FDA approved in patients less than 80 years old. Performed at [facility], [address] [place], [place] [address]   Culture, Respiratory w Gram Stain     Status: None   Collection Time: 08/09/23  9:53 AM   Specimen: Tracheal Aspirate; Respiratory  Result Value Ref Range Status   Specimen Description TRACHEAL ASPIRATE  Final   Special Requests NONE  Final   Gram Stain   Final    MODERATE WBC PRESENT, PREDOMINANTLY PMN RARE SQUAMOUS EPITHELIAL CELLS PRESENT RARE GRAM POSITIVE COCCI Performed at [facility], [address], [place], [place] [address]    Culture RARE STAPHYLOCOCCUS EPIDERMIDIS  Final   Report Status 08/12/2023 FINAL  Final   Organism ID, Bacteria STAPHYLOCOCCUS EPIDERMIDIS  Final      Susceptibility   Staphylococcus epidermidis - MIC*    CIPROFLOXACIN <=0.5 SENSITIVE Sensitive     ERYTHROMYCIN >=8 RESISTANT Resistant     GENTAMICIN <=0.5 SENSITIVE Sensitive     OXACILLIN >=4 RESISTANT Resistant     TETRACYCLINE 2 SENSITIVE  Sensitive     VANCOMYCIN 1 SENSITIVE Sensitive     TRIMETH/SULFA 160 RESISTANT Resistant     CLINDAMYCIN <=0.25 SENSITIVE Sensitive     RIFAMPIN <=0.5 SENSITIVE Sensitive     Inducible Clindamycin NEGATIVE Sensitive     * RARE STAPHYLOCOCCUS EPIDERMIDIS  Culture, blood (Routine X 2) w Reflex to ID Panel     Status: None   Collection Time: 08/10/23 12:14 PM   Specimen: BLOOD  Result Value Ref Range Status   Specimen Description BLOOD SITE NOT SPECIFIED  Final   Special Requests   Final    BOTTLES DRAWN AEROBIC AND ANAEROBIC Blood Culture results may not be optimal due to an inadequate volume of blood received in culture bottles   Culture   Final    NO GROWTH 5 DAYS Performed at [facility], [address], [place], [place] [address]    Report Status 08/22/2023 FINAL  Final  Culture, blood (Routine X 2) w Reflex to ID Panel     Status: None   Collection Time: 08/10/23 12:14 PM   Specimen: BLOOD  Result Value Ref Range Status   Specimen Description BLOOD SITE NOT SPECIFIED  Final   Special Requests   Final    BOTTLES DRAWN AEROBIC AND ANAEROBIC Blood Culture results may not be optimal due to an inadequate volume of blood received in culture bottles   Culture   Final    NO GROWTH 5 DAYS Performed at [facility], [address], [place], [place] [address]    Report Status 08/26/2023 FINAL  Final  Aerobic/Anaerobic Culture w Gram Stain (surgical/deep wound)     Status: None (Preliminary result)   Collection Time: 08/12/23  1:27 PM   Specimen: Gallbladder; Bile  Result Value Ref Range Status   Specimen Description GALL BLADDER  Final   Special Requests NONE  Final   Gram Stain NO WBC SEEN NO ORGANISMS SEEN   Final  Culture   Final    NO GROWTH 3 DAYS NO ANAEROBES ISOLATED; CULTURE IN PROGRESS FOR 5 DAYS Performed at [facility], [address], [place], [place] [address]    Report Status PENDING  Incomplete    Lab Basic Metabolic Panel: Recent Labs   Lab 08/09/23 0107 08/09/23 0114 08/09/23 0533 08/09/23 1700 08/09/23 1954 08/10/23 0521 08/10/23 1620 08/10/23 2358 08/11/23 0037 08/11/23 6045 08/12/23 0500 08/13/23 0925 08/14/23 0409 08/12/2023 0452 08/20/2023 0619  NA 134*   < > 134*  --    < > 133* 135   < > 136   < > 137 140 139 142 137  K 4.3   < > 4.4  --    < > 4.3 4.1   < > 4.0   < > 3.8 3.5 3.5 5.6* 5.2*  CL 106  --  107  --   --  107 104  --  104  --  105 102 99 101  --   CO2 16*  --  19*  --   --  12* 18*  --  14*  --  16* 20* 18* 13*  --   GLUCOSE 157*  --  164*  --   --  203* 152*  --  107*  --  170* 212* 253* 253*  --   BUN 49*  --  46*  --   --  57* 55*  --  54*  --  68* 89* 108* 138*  --   CREATININE 1.46*  --  1.27*  --   --  1.40* 1.30*  --  1.33*  --  1.37* 1.56* 1.77* 2.65*  --   CALCIUM 7.8*  --  7.6*  --   --  7.8* 7.7*  --  7.5*  --  7.7* 8.0* 8.1* 7.7*  --   MG 1.7  --  1.8 1.8  --  2.1 2.0  --   --   --   --   --   --   --   --   PHOS 4.5  --  4.7* 4.3  --  3.6 3.4  --   --   --   --   --   --   --   --    < > = values in this interval not displayed.   Liver Function Tests: Recent Labs  Lab 08/11/23 0453 08/12/23 0500 08/13/23 0925 08/14/23 0409 08/11/2023 0452  AST 141* 134* 161* 134* 253*  ALT 104* 77* 66* 58* 69*  ALKPHOS 141* 143* 140* 148* 146*  BILITOT 5.1* 6.2* 10.7* 14.5* 16.7*  PROT 3.3* 3.9* 3.5* 3.4* <3.0*  ALBUMIN <1.5* 2.2* 1.7* 1.5* <1.5*   Recent Labs  Lab 08/11/23 0037  LIPASE 34   Recent Labs  Lab 08/12/23 1346  AMMONIA 43*   CBC: Recent Labs  Lab 08/11/23 0037 08/11/23 0807 08/11/23 2304 08/12/23 0500 08/13/23 0422 08/14/23 0409 08/14/23 0929 08/30/2023 0619 08/11/2023 0640  WBC 16.5*  --  27.3* 25.7* 25.2* 26.8*  --   --  51.1*  NEUTROABS 14.0*  --   --  23.9* 23.7* 26.0*  --   --  32.7*  HGB 7.0*   < > 8.0* 7.5* 7.6* 8.0*  --  6.5* 6.9*  HCT 21.6*   < > 24.3* 22.7* 23.1* 24.0*  --  19.0* 23.3*  MCV 88.5  --  87.1 87.3 87.2 86.3  --   --  97.9  PLT 57*  --  72* 73* 65* 34* 27*  --  23*   < > = values in this interval not displayed.   Cardiac Enzymes: No results for input(s): "CKTOTAL", "CKMB", "CKMBINDEX", "TROPONINI" in the last 168 hours. Sepsis Labs: Recent Labs  Lab 08/09/23 0533 08/09/23 1725 08/11/23 0453 08/11/23 1743 08/12/23 0500 08/12/23 1700 08/13/23 0422 08/13/23 1315 08/14/23 0409 09/02/2023 0452 08/18/2023 0640  PROCALCITON 4.87  --  9.75  --   --   --   --   --   --   --   --   WBC 18.9*   < >  --    < > 25.7*  --  25.2*  --  26.8*  --  51.1*  LATICACIDVEN 4.5*   < > 8.0*   < > 8.2*   < > >9.0* >9.0* >9.0* >9.0*  --    < > = values in this interval not displayed.

## 2023-09-10 NOTE — Progress Notes (Signed)
     Brief progress note:   Chart reviewed. Discussed with attending MD, Dr. Celine Mans.  At this time, goals are clear for continued full scope treatment.  Palliative care is following for needs and support pending clinical outcomes. Please contact our team at [phone] if there are immediate needs or patient's condition changes.    Sherlean Foot, NP-C Palliative Medicine   Please call Palliative Medicine team phone with any questions [phone]. For individual providers please see AMION.   No charge

## 2023-09-10 NOTE — Progress Notes (Signed)
 9604- Patient noted to go into rapid heart rate again with BP dropping to 70s systolic with vaso and levo infusing . Elink/CCM notified. EKG completed, indicating Afib RVR. Labs drawn and sent.   49- CCM ground team at bedside, new verbal orders to give 1 amp Bicarb, start Neo drip, collect ABG, and increase continuous bicarb drip to 149mL/hr.   0630- Patient's labs resulted, CCM notified of results. New orders for additional 2 amps of bicarb. Family at bedside.

## 2023-09-10 NOTE — Progress Notes (Signed)
 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   On ED arrival  pt was afebrile, HR:63, BP: 104/47, RR 23. Labs were notable for WBC 7.9, Hgb: 9.0( baseline around 12), Platelets 87, Na: 132, K: 4.1, CO2: 21, Cr, 0.87( Baseline), Transaminases/ ALK Phos WNL, T.bili elevated at 1.9, Mg: 1.4. Troponin : 5--> 4, BNP: 33. FOBT negative, Iron studies consistent with deficiency, B12 low. TSH/T4 WNL. UA grossly normal. RVP negative. CXR no acute, chronic right base changes.  Admitted to [facility]. GI was consulted for anemia in the setting of DAPT with recommendation for BID PPI and EGD to r/o occult GIB. EGD completed 3/23 with mild gastritis, no evidence of active bleeding. Neurology consulted(3/25) for new onset confusion. CT head completed 3/25 with NAICA, new small chronic cortical/ subcortical infarct R frontal lobe ( new from prior), known small chronic infarcts, L frontal/ L parietal/ R temporal lobes. EEG (3/25) showed mild diffuse encephalopathy, no seizures. F/u MRI brain (3/25) showed many small acute infarcts concerning for embolic etiology. MRI C-Spine with severe canal stenosis concerning for edema and compressive myelopathy, severe left/moderate right foraminal stenosis, edema of left T1 inferior articular facet. MRI T-spine negative. Given patients progressive weakness in the setting of known C-Spine compression, NSG consulted for  evaluation 3/26 with recommendation for eventual cord decompression once optimized from a medical standpoint.Additionally, psychiatry was consulted for recommendations on medication management for depressed mood. Cardiology was consulted 3/27 for preoperative cardiac risk stratification.  On 3/28AM, patient was reportedly hot to touch per RN with increased RR and WOB and Tmax 103F. CXR obtained 3/28 stable . WBC slightly elevated to 10.8 , INR 1.4, K 3.3, CO2 21. PCT 1.35 ( 0.65), LA 2.2 with repeat >9.   PCCM consulted for concern for developing sepsis, acute liver injury.  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 CT Head/MRI Brain obtained. MRI C-spine and T-spine completed. 3/26 - NSGY evaluation for ?decompression surgery.  3/27 - Cards consult for preoperative risk stratification for possible cord decompression  surgery, tentatively 3/31. 3/28 - Early AM, febrile to Tmax 103F with increased RR/WOB and ?aspiration; LA 2.2 with mild WBC elevation to 10.8; CXR stable. LA > 9 prompted PCCM consult (aberrant value, repeat 2.1). LFTs elevated. Worsening mental status/drowsiness. 3/29  Worsening mental status. Intubated for airway protection. MRI with new 5 mm acute infarct in left pons and numerous acute/subacute infarcts without interval progression 4/1: SVT overnight received adenosine. Followed by aflutter received amio bolus and gtt. In sinus in AM. Ongoing profound septic shock on 3 vasopressors, bicarb gtt. Unclear source but high suspicion for biliary - going for HIDA scan  4/2: IR consulted after HIDA and deferred intervention. He was re-pan scanned with no other obvious source of infection. In AM  with ongoing lactic of 8, shock, and persistently elevating LFTs. IR re-consulted and will evaluate patient for perc drain.  4/3: unfortunately no improvement after IR bili drain placed. Morning lactate 9. Will have ongoing GOC with family/neuro/ccm today  Interim History / Subjective:  Worsening shock overnight   Objective:  Blood pressure (!) 74/64, pulse (!) 131, temperature (!) 101.2 F (38.4 C), temperature source Axillary, resp. rate (!) 29, height 5' 10.98" (1.803 m), weight 106 kg, SpO2 97%.    Vent Mode: PRVC FiO2 (%):  [40 %-100 %] 80 % Set Rate:  [18 bmp] 18 bmp Vt Set:  [600 mL] 600 mL PEEP:  [5 cmH20-8 cmH20] 8 cmH20 Pressure Support:  [10 cmH20] 10 cmH20 Plateau Pressure:  [22 cmH20-24 cmH20] 22 cmH20   Intake/Output Summary (Last 24 hours) at 08/27/2023 0819 Last data filed at 08/31/2023 0700 Gross per 24 hour  Intake 4027.29 ml  Output 825 ml  Net 3202.29 ml   Filed Weights   08/10/23 0500 08/11/23 0444 08/13/23 0500  Weight: 102.2 kg 103 kg 106 kg   Physical Exam: Gen:      Intubated, sedated, acutely ill appearing HEENT:  ETT to vent Lungs:    sounds of mechanical ventilation auscultated dimished, no wheeze CV:         RRR Abd:      + bowel sounds; soft, non-tender; no palpable masses, no distension Ext:    No edema Skin:      jaundiced Neuro:   not responsive, moribund    Labs and imaging personally reviewed All labs worse today.  INR>5 Lactic acid >9\ pH 7 despite bicarb drip K 5.2 WBC 51 Hb 6.9  Resolved Hospital Problem List:   Assessment & Plan:   GOC: Discussed plan of care with family at bedside today. Given overwhelming signs of multi-organ failure despite all aggressive measures family has elected to move towards comfort measures. Initated orderset. Hospital chaplain called.  Anticipate in hospital death.   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 TCD bubble study 2/15 with "small, clinically insignificant PFO". 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 Failed Plavix and Brilinta - stroke team consulted, appreciate management  - IV heparin for now  - no aspirin per neuro due to worsening thrombocytopenia  - con't hold statin, zetia with liver injury  - serial neuro checks - neuroprotective measures - mirtazapine stopped with encephalopathy - anticipate SNF  - not appropriate for PT/OT at this time  Acute hypoxemic respiratory failure secondary to aspiration in setting of  recurrent strokes Tracheal aspirate with staph epi growing. Was also on initial blood culture 1/4 bottles.  - on vanc/zosyn which coverage  - Not appropriate for SBT at this time - full mechanical vent support - lung protective ventilation 6-8cc/kg Vt - VAP and PAD bundle in place  - titrate FiO2 to sat goal >92  - maintain peak/plats <30, driving pressures <16    Septic shock, unknown etiology, suspect biliary  Lactic Acidosis  Unclear etiology, s/p percutaneous biliary drain placement 4/2 with concern for obstruction, no substantial improvement -Continue PERC drain for now - con't vancomycin, zosyn empirically - con't vaso, levo, for MAP greater than 65 -  continue hydrocortisone 100mg  q12h  - con't bicarb gtt, may need to come off once bicarb normalizes  Acute kidney injury Component of multiorgan failure, likely hypoperfusion versus septic ATN. -Given trial of Lasix on 4/2 given that he is 13 L positive.  He did have slight increase in urine output, however serum creatinine rose from 1.37-1.56 with BUN elevation to 89. -Hold further diuresis -- trend bmp, mag, phos - replete elytes - strict I&O - Avoid nephrotoxic agents, renally dose medications - ensure adequate renal perfusion   Elevated LFTs  Hyperbilirubinemia-- worsening Thrombocytopenia Differential includes embolic concern (PVT versus Budd-Chiari, given history of clotting disorder), DILI, acute hepatitis, sepsis, cholecystitis, cholangitis. RUQ as above. 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   Cervical spinal stenosis with cord compression S/p prior ACDF (2014) MRI C-spine with severe canal stenosis concerning for edema and compressive myelopathy, severe left/moderate right foraminal stenosis, edema of left T1 inferior articular facet. MRI T-spine negative. - NSGY consulted- tentative plan for cord decompression, waiting on improvement in overall medical status. -PT, OT when stable  Anemia, multifactorial in the setting of critical illness, B12 deficiency, iron deficiency -transfuse for Hb <7 or hemodynamically  significant bleeding -iron, thiamine, folace, B12 supplements  T2DM with hyperglycemia - SSI, will increase to resistant - goal BG 140-180 - increasing basal to 40 units daily - increased tube feed coverage   History of diverticulosis - Noted history  Hard of hearing, congenital -hearing aids in place    The patient is critically ill due to shock multi organ failure.  Critical care was necessary to treat or prevent imminent or life-threatening deterioration.  Critical care was time spent personally by me on the following activities: development of treatment plan with patient and/or surrogate as well as nursing, discussions with consultants, evaluation of patient's response to treatment, examination of patient, obtaining history from patient or surrogate, ordering and performing treatments and interventions, ordering and review of laboratory studies, ordering and review of radiographic studies, pulse oximetry, re-evaluation of patient's condition and participation in multidisciplinary rounds.   Critical Care Time devoted to patient care services described in this note is 33 minutes. This time reflects time of care of this signee Charlott Holler . This critical care time does not reflect separately billable procedures or procedure time, teaching time or supervisory time  of PA/NP/Med student/Med Resident etc but could involve care discussion time.       Charlott Holler [facility] 09/06/2023 8:21 AM  Pager: see AMION  If no response to pager , please call critical care on call (see AMION) until 7pm After 7:00 pm call Elink

## 2023-09-10 NOTE — Progress Notes (Signed)
 Chaplain receives request for visit from departing chaplain for EOL support. Chaplain visits and finds patient's wife, daughter, and son-in-law present. Chaplain facilitates life review and memory sharing, then provides prayer. Encouraged family to notify nurse if they need additional support.

## 2023-09-10 NOTE — Progress Notes (Signed)
 STROKE TEAM PROGRESS NOTE   INTERIM HISTORY/SUBJECTIVE Wife, sister, brother in law are at the bedside.   Patient's condition unfortunately has declined with multiorgan failure and worsening hepatic and renal function and coagulopathy and white count and family met with critical care team and has made him DNR and full comfort care measures.  He has been extubated. OBJECTIVE  CBC    Component Value Date/Time   WBC 51.1 (HH) 08/24/2023 0640   RBC 2.38 (L) 08/12/2023 0640   HGB 6.9 (LL) 09/07/2023 0640   HGB 12.3 (L) 05/01/2023 1204   HCT 23.3 (L) 08/27/2023 0640   PLT 23 (LL) 09/06/2023 0640   PLT 249 05/01/2023 1204   MCV 97.9 09/09/2023 0640   MCH 29.0 08/29/2023 0640   MCHC 29.6 (L) 08/25/2023 0640   RDW 17.5 (H) 09/02/2023 0640   LYMPHSABS 4.0 08/30/2023 0640   MONOABS 9.3 (H) 08/25/2023 0640   EOSABS 0.0 08/24/2023 0640   BASOSABS 0.2 (H) 08/29/2023 0640    BMET    Component Value Date/Time   NA 137 08/22/2023 0619   NA 142 04/06/2017 1057   K 5.2 (H) 08/25/2023 0619   CL 101 08/12/2023 0452   CO2 13 (L) 09/05/2023 0452   GLUCOSE 253 (H) 09/09/2023 0452   BUN 138 (H) 08/30/2023 0452   BUN 13 04/06/2017 1057   CREATININE 2.65 (H) 09/08/2023 0452   CREATININE 0.94 05/01/2023 1204   CALCIUM 7.7 (L) 09/03/2023 0452   GFRNONAA 25 (L) 08/26/2023 0452   GFRNONAA >60 05/01/2023 1204    IMAGING past 24 hours DG CHEST PORT 1 VIEW Result Date: 08/14/2023 CLINICAL DATA:  Acute respiratory failure, hypoxia EXAM: PORTABLE CHEST 1 VIEW COMPARISON:  08/10/2023 FINDINGS: Single frontal view of the chest demonstrates stable endotracheal tube, enteric catheter, left-sided central venous catheter, and loop recorder. Cardiac silhouette is unremarkable. Trace right pleural effusion. Streaky consolidation at the lung bases consistent with atelectasis. No pneumothorax. No acute bony abnormalities. IMPRESSION: 1. Support devices as above. 2. Stable small right pleural effusion and bibasilar  atelectasis. Electronically Signed   By: Sharlet Salina M.D.   On: 08/14/2023 18:35      Vitals:   09/09/2023 0645 09/06/2023 0650 08/12/2023 0700 08/16/2023 0800  BP:   (!) 74/64 (!) 76/56  Pulse: (!) 128 (!) 123 (!) 131 (!) 117  Resp: (!) 29 (!) 28 (!) 29 (!) 28  Temp:    (!) 101.2 F (38.4 C)  TempSrc:    Axillary  SpO2: 97% 96% 97% 96%  Weight:      Height:         PHYSICAL EXAM  Temp:  [99.1 F (37.3 C)-101.2 F (38.4 C)] 101.2 F (38.4 C) (04/05 0800) Pulse Rate:  [75-142] 117 (04/05 0800) Resp:  [23-31] 28 (04/05 0800) BP: (71-142)/(29-67) 76/56 (04/05 0800) SpO2:  [92 %-100 %] 96 % (04/05 0800) Arterial Line BP: (64-128)/(30-51) 93/40 (04/05 0800) FiO2 (%):  [40 %-100 %] 80 % (04/05 0235)  General - Well nourished, well developed,  off sedation.  Ophthalmologic - fundi not visualized due to noncooperation.  Cardiovascular - Regular rate and rhythm.  Extremities - extensive peripheral edema  Neuro - intubated off sedation, eyes closed but briefly open on voice, still not following commands. Eyes in mid position but not tracking to voice today, not blinking to visual threat, pupils equal size light reflexes sluggish. Corneal reflex present bilaterally, gag and cough present. Breathing over the vent.  Facial symmetry not able to test  due to ET tube.  Tongue protrusion not cooperative. On pain stimulation, no movement in all extremities. Severe edema in all extremities. Sensation, coordination and gait not tested.    ASSESSMENT/PLAN  Mr. KAEL KEETCH is a 73 y.o. male with history of prior history of cryptogenic strokes, diabetes, hyperlipidemia, hypertension, iron deficiency anemia, OSA, prostate cancer admitted for gradually progressive weakness and poor nutrition intake.    Stroke:  b/l small bilateral multifocal embolic infarcts, etiology: possible from primary hypercoagulable state CT head No acute abnormality.  MRI  Many small acute infarcts in the bilateral  frontal and parietal lobes, left thalamus, right occipital lobe, and cerebellum MRA iron Cabbell MRI repeat new small left pontine acute infarct.  Possible small left frontal cortex new infarct.  Stable subacute infarcts seen on the last MRI 2D Echo EF 60-65%.  LA size normal. CT chest abdomen pelvis no evidence of malignancy  Loop recorder interrogation negative for paroxysmal A-fib  LDL 47 HgbA1c 5.3 EEG diffuse slowing.  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 Off ASA due to thrombocytopenia Therapy recommendations: SNF Disposition: Pending, palliative care on board, wife seems would accept  comfort care if condition not getting better.  Respiratory failure Aspiration Sepsis / septic shock Intubated for airway protection CCM on board Vent management per CCM On Zosyn and vancomycin Stat post percutaneous cholecystectomy with drainage Leukocytosis WBC 15.0->16.6->16.5->25.7-> 25.2->26.8 LA 4.8->6.0->6.6->8.0->8.2-> >9.0-> >9.0   Hx of Stroke/TIA 09/2022 admitted for bilateral ACA infarct, left more than right. CTA head and neck showed right A3 stenosis. EF 55 to 60%, no DVT. LDL 45, A1c 5.6. Discharged on DAPT and Lipitor 40 and Zetia. Loop recorder placed.  03/2023 admitted for small acute to subacute infarct on the left frontal and left parietal lobes, and right temporal white matter.  CTA head and neck similar diminutive distal right ACA.  EF 60 to 65%.  Loop recorder no A-fib.  LDL 47, A1c 8.2.  Discharged on aspirin and Brilinta for 30 days and then Plavix alone.  Also discharged on Lipitor and Zetia.  Primary hypercoagulable state Patient seen by oncology in 04/2023 found to have factor V heterozygous mutation, and prothrombin gene heterozygous deletion, and protein S deficiency Recommended DOAC and aspirin 81 at that  time. Dr. 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 Cord T2 hyperintensity superior to this level, suspicious for edema and compressive myelopathy. Severe left and moderate right foraminal stenosis at this level. MRI T-spine Edema involving the left T1 inferior articular facet Neurosurgery consulted, recommend cervical surgery once medically stable.   Off aspirin 81 due to continued thrombocytopenia, has been off brilinta  Hx of hypertension Now hypotension with septic shock Home meds: Lotensin 40 mg Hygroton 25 mg Now on Levophed, off neo and vasopressin Long-term BP goal normotensive  Hyperlipidemia Home meds: Atorvastatin 40 mg and Zetia 10 mg LDL 47, goal < 70 Statin and zetia are on hold given acute liver injury Continue statin once LFTs normalize  Diabetes type II Controlled Home meds: Metformin, Toujeo HgbA1c 5.3, goal < 7.0 CBGs SSI  Recommend close follow-up with PCP for better DM control  Acute liver injury AST/ALT 515/469--376/361--172/240--110/138--141/104--134/77--161/66--134/58 INR 1.4--1.3--1.4--1.4 Close monitoring Management per CCM  Anemia and thrombocytopenia ? HIT vs. DIC Hemoglobin 8.9--8.5--8.1--7.8--8.4--7.5--6.5--PRBC--7.5--7.6--8.0 Platelet 97--91--89--71--64--57->73-> 65->34 Check DIC panel and  HIT antibodies Now on argatroban to replace heparin   Other Stroke Risk Factors Obstructive sleep apnea, not on CPAP at home Advanced age  Other Active Problems BPH Depression, psychiatry on board B12 deficiency Cognitive impairment AKI, Cre 1.46--1.33--1.37--1.56--1.77   Hospital day # 14 Patient condition unfortunately is declining with worsening multiorgan failure and sepsis  and DIC.  Patient made DNR comfort care and compassionately extubated.  Long discussion with patient's wife and multiple family members at the bedside and family in agreement with goals of care and.  Greater than 50% time during this 35-minute visit was spent in counseling and coordination of care and discussion with patient and care team.  Discussed with Dr. Celine Mans. Delia Heady, MD Stroke Neurology 08/28/2023 1:31 PM    To contact Stroke Continuity provider, please refer to [email or web address]. After hours, contact General Neurology

## 2023-09-10 NOTE — Progress Notes (Signed)
 Called to bedside for hypotension  On arrival BP 60/30 on vaso 0.04, levo 40, bicarb gtt, amio (for RVR earlier tonight) @30 .   I ordered push of bicarb which briefly increased BP and starting neo gtt.   I also called patient's wife, [name] and requested her come to the hospital and she is now on her way. Will attempt to increase pressor support for her to visit and recommend transitioning to comfort once she arrives given his clinical course.

## 2023-09-10 NOTE — Progress Notes (Signed)
 0920-Pt extubated per order with RN.

## 2023-09-10 NOTE — Plan of Care (Signed)
  Problem: Elimination: Goal: Will not experience complications related to bowel motility Outcome: Progressing   Problem: Fluid Volume: Goal: Ability to maintain a balanced intake and output will improve Outcome: Not Progressing   Problem: Metabolic: Goal: Ability to maintain appropriate glucose levels will improve Outcome: Not Progressing   Problem: Tissue Perfusion: Goal: Adequacy of tissue perfusion will improve Outcome: Not Progressing   Problem: Clinical Measurements: Goal: Ability to maintain clinical measurements within normal limits will improve Outcome: Not Progressing Goal: Cardiovascular complication will be avoided Outcome: Not Progressing

## 2023-09-10 NOTE — Progress Notes (Signed)
 2050- patient with sudden increased heart rate to 150s-190s and accompanied drop in BP to 80s systolic. Not sustained, going in and out of regular rate and elevated tachycardia. Elink/CCM notified. Levophed titrated up according to orders during this time.  2148- patient's HR again elevated to 160s-180s and alarming afib. Link/CCM notified again and orders obtained to initial amiodarone bolus followed by continuous infusion.   2200 to 2230- Patient's HR returned to controlled rate/rhythm and BP improved to systolic greater than 100, but with MAP still below goal. MD notified, and Levophed maxed.

## 2023-09-10 DEATH — deceased
